# Patient Record
Sex: Female | Born: 1935 | Race: White | Hispanic: No | Smoking: Never smoker
Health system: Southern US, Community
[De-identification: ages and names within clinical notes are randomized; demographics above are authoritative.]

## PROBLEM LIST (undated history)

## (undated) DIAGNOSIS — F419 Anxiety disorder, unspecified: Secondary | ICD-10-CM

## (undated) DIAGNOSIS — I495 Sick sinus syndrome: Secondary | ICD-10-CM

## (undated) DIAGNOSIS — I499 Cardiac arrhythmia, unspecified: Secondary | ICD-10-CM

## (undated) DIAGNOSIS — G8929 Other chronic pain: Secondary | ICD-10-CM

## (undated) DIAGNOSIS — M1990 Unspecified osteoarthritis, unspecified site: Secondary | ICD-10-CM

## (undated) DIAGNOSIS — H269 Unspecified cataract: Secondary | ICD-10-CM

## (undated) DIAGNOSIS — K219 Gastro-esophageal reflux disease without esophagitis: Secondary | ICD-10-CM

## (undated) DIAGNOSIS — I700 Atherosclerosis of aorta: Secondary | ICD-10-CM

## (undated) DIAGNOSIS — Z950 Presence of cardiac pacemaker: Secondary | ICD-10-CM

## (undated) DIAGNOSIS — I10 Essential (primary) hypertension: Secondary | ICD-10-CM

## (undated) DIAGNOSIS — N049 Nephrotic syndrome with unspecified morphologic changes: Secondary | ICD-10-CM

## (undated) DIAGNOSIS — I480 Paroxysmal atrial fibrillation: Secondary | ICD-10-CM

## (undated) DIAGNOSIS — E785 Hyperlipidemia, unspecified: Secondary | ICD-10-CM

## (undated) DIAGNOSIS — R7303 Prediabetes: Secondary | ICD-10-CM

## (undated) DIAGNOSIS — E039 Hypothyroidism, unspecified: Secondary | ICD-10-CM

## (undated) DIAGNOSIS — G629 Polyneuropathy, unspecified: Secondary | ICD-10-CM

## (undated) DIAGNOSIS — M549 Dorsalgia, unspecified: Secondary | ICD-10-CM

## (undated) DIAGNOSIS — F329 Major depressive disorder, single episode, unspecified: Secondary | ICD-10-CM

## (undated) DIAGNOSIS — F32A Depression, unspecified: Secondary | ICD-10-CM

## (undated) DIAGNOSIS — J449 Chronic obstructive pulmonary disease, unspecified: Secondary | ICD-10-CM

## (undated) DIAGNOSIS — N022 Recurrent and persistent hematuria with diffuse membranous glomerulonephritis: Secondary | ICD-10-CM

## (undated) DIAGNOSIS — M8580 Other specified disorders of bone density and structure, unspecified site: Secondary | ICD-10-CM

## (undated) HISTORY — DX: Unspecified cataract: H26.9

## (undated) HISTORY — DX: Major depressive disorder, single episode, unspecified: F32.9

## (undated) HISTORY — DX: Chronic obstructive pulmonary disease, unspecified: J44.9

## (undated) HISTORY — DX: Gastro-esophageal reflux disease without esophagitis: K21.9

## (undated) HISTORY — DX: Cardiac arrhythmia, unspecified: I49.9

## (undated) HISTORY — DX: Depression, unspecified: F32.A

## (undated) HISTORY — PX: ABDOMINAL HYSTERECTOMY: SHX81

## (undated) HISTORY — PX: BACK SURGERY: SHX140

## (undated) HISTORY — DX: Atherosclerosis of aorta: I70.0

## (undated) HISTORY — DX: Prediabetes: R73.03

## (undated) HISTORY — PX: APPENDECTOMY: SHX54

## (undated) HISTORY — DX: Hyperlipidemia, unspecified: E78.5

## (undated) HISTORY — DX: Anxiety disorder, unspecified: F41.9

## (undated) HISTORY — DX: Recurrent and persistent hematuria with diffuse membranous glomerulonephritis: N02.2

## (undated) HISTORY — DX: Presence of cardiac pacemaker: Z95.0

## (undated) HISTORY — PX: OTHER SURGICAL HISTORY: SHX169

## (undated) HISTORY — DX: Dorsalgia, unspecified: M54.9

## (undated) HISTORY — DX: Nephrotic syndrome with unspecified morphologic changes: N04.9

## (undated) HISTORY — DX: Other specified disorders of bone density and structure, unspecified site: M85.80

## (undated) HISTORY — DX: Unspecified osteoarthritis, unspecified site: M19.90

## (undated) HISTORY — PX: INSERT / REPLACE / REMOVE PACEMAKER: SUR710

## (undated) HISTORY — DX: Other chronic pain: G89.29

---

## 1999-07-25 ENCOUNTER — Other Ambulatory Visit: Admission: RE | Admit: 1999-07-25 | Discharge: 1999-07-25 | Payer: Self-pay | Admitting: Obstetrics and Gynecology

## 2000-07-22 ENCOUNTER — Encounter: Payer: Self-pay | Admitting: Neurosurgery

## 2000-07-26 ENCOUNTER — Encounter: Payer: Self-pay | Admitting: Neurosurgery

## 2000-07-26 ENCOUNTER — Inpatient Hospital Stay (HOSPITAL_COMMUNITY): Admission: RE | Admit: 2000-07-26 | Discharge: 2000-07-27 | Payer: Self-pay | Admitting: Neurosurgery

## 2001-03-30 ENCOUNTER — Ambulatory Visit (HOSPITAL_COMMUNITY): Admission: RE | Admit: 2001-03-30 | Discharge: 2001-03-30 | Payer: Self-pay | Admitting: Family Medicine

## 2001-03-30 ENCOUNTER — Encounter: Payer: Self-pay | Admitting: Family Medicine

## 2001-08-04 ENCOUNTER — Encounter: Payer: Self-pay | Admitting: Orthopaedic Surgery

## 2001-08-04 ENCOUNTER — Ambulatory Visit (HOSPITAL_COMMUNITY): Admission: RE | Admit: 2001-08-04 | Discharge: 2001-08-04 | Payer: Self-pay | Admitting: Orthopaedic Surgery

## 2001-11-03 ENCOUNTER — Encounter: Payer: Self-pay | Admitting: Family Medicine

## 2001-11-03 ENCOUNTER — Inpatient Hospital Stay (HOSPITAL_COMMUNITY): Admission: AD | Admit: 2001-11-03 | Discharge: 2001-11-07 | Payer: Self-pay | Admitting: Family Medicine

## 2002-08-16 ENCOUNTER — Encounter: Payer: Self-pay | Admitting: Orthopaedic Surgery

## 2002-08-16 ENCOUNTER — Ambulatory Visit (HOSPITAL_COMMUNITY): Admission: RE | Admit: 2002-08-16 | Discharge: 2002-08-16 | Payer: Self-pay | Admitting: Orthopaedic Surgery

## 2002-12-11 ENCOUNTER — Encounter: Payer: Self-pay | Admitting: Emergency Medicine

## 2002-12-11 ENCOUNTER — Emergency Department (HOSPITAL_COMMUNITY): Admission: EM | Admit: 2002-12-11 | Discharge: 2002-12-11 | Payer: Self-pay | Admitting: Emergency Medicine

## 2004-02-23 ENCOUNTER — Emergency Department (HOSPITAL_COMMUNITY): Admission: EM | Admit: 2004-02-23 | Discharge: 2004-02-23 | Payer: Self-pay | Admitting: Emergency Medicine

## 2004-04-19 ENCOUNTER — Emergency Department (HOSPITAL_COMMUNITY): Admission: EM | Admit: 2004-04-19 | Discharge: 2004-04-20 | Payer: Self-pay | Admitting: *Deleted

## 2004-06-05 HISTORY — PX: COLONOSCOPY: SHX174

## 2004-06-11 ENCOUNTER — Ambulatory Visit (HOSPITAL_COMMUNITY): Admission: RE | Admit: 2004-06-11 | Discharge: 2004-06-11 | Payer: Self-pay | Admitting: Internal Medicine

## 2005-03-27 ENCOUNTER — Ambulatory Visit (HOSPITAL_COMMUNITY): Admission: RE | Admit: 2005-03-27 | Discharge: 2005-03-27 | Payer: Self-pay | Admitting: Neurosurgery

## 2007-01-26 ENCOUNTER — Ambulatory Visit (HOSPITAL_COMMUNITY): Admission: RE | Admit: 2007-01-26 | Discharge: 2007-01-26 | Payer: Self-pay | Admitting: Orthopaedic Surgery

## 2007-04-28 ENCOUNTER — Emergency Department (HOSPITAL_COMMUNITY): Admission: EM | Admit: 2007-04-28 | Discharge: 2007-04-28 | Payer: Self-pay | Admitting: Emergency Medicine

## 2010-06-03 ENCOUNTER — Encounter (HOSPITAL_COMMUNITY): Admission: RE | Admit: 2010-06-03 | Discharge: 2010-07-03 | Payer: Self-pay | Admitting: Hematology and Oncology

## 2010-06-03 ENCOUNTER — Ambulatory Visit (HOSPITAL_COMMUNITY): Payer: Self-pay | Admitting: Hematology and Oncology

## 2010-06-24 ENCOUNTER — Ambulatory Visit (HOSPITAL_COMMUNITY): Admission: RE | Admit: 2010-06-24 | Discharge: 2010-06-24 | Payer: Self-pay | Admitting: Family Medicine

## 2010-06-30 ENCOUNTER — Ambulatory Visit (HOSPITAL_COMMUNITY): Payer: Self-pay | Admitting: Oncology

## 2010-06-30 ENCOUNTER — Encounter (HOSPITAL_COMMUNITY): Admission: RE | Admit: 2010-06-30 | Discharge: 2010-07-04 | Payer: Self-pay | Admitting: Oncology

## 2010-07-11 ENCOUNTER — Ambulatory Visit (HOSPITAL_COMMUNITY): Admission: RE | Admit: 2010-07-11 | Discharge: 2010-07-11 | Payer: Self-pay | Admitting: Cardiology

## 2010-07-11 ENCOUNTER — Encounter (INDEPENDENT_AMBULATORY_CARE_PROVIDER_SITE_OTHER): Payer: Self-pay | Admitting: Cardiology

## 2010-07-14 ENCOUNTER — Ambulatory Visit (HOSPITAL_COMMUNITY)
Admission: RE | Admit: 2010-07-14 | Discharge: 2010-07-14 | Payer: Self-pay | Source: Home / Self Care | Admitting: Cardiology

## 2010-07-18 ENCOUNTER — Encounter (HOSPITAL_COMMUNITY): Admission: RE | Admit: 2010-07-18 | Discharge: 2010-08-17 | Payer: Self-pay | Admitting: Hematology and Oncology

## 2010-07-18 ENCOUNTER — Ambulatory Visit (HOSPITAL_COMMUNITY): Payer: Self-pay | Admitting: Hematology and Oncology

## 2010-07-22 ENCOUNTER — Encounter (HOSPITAL_COMMUNITY)
Admission: RE | Admit: 2010-07-22 | Discharge: 2010-07-22 | Payer: Self-pay | Source: Home / Self Care | Admitting: Cardiology

## 2010-10-09 ENCOUNTER — Other Ambulatory Visit: Payer: Self-pay | Admitting: Cardiovascular Disease

## 2010-10-09 ENCOUNTER — Ambulatory Visit (HOSPITAL_COMMUNITY)
Admission: RE | Admit: 2010-10-09 | Discharge: 2010-10-09 | Payer: Self-pay | Source: Home / Self Care | Attending: Cardiovascular Disease | Admitting: Cardiovascular Disease

## 2010-12-18 LAB — CBC
HCT: 32.8 % — ABNORMAL LOW (ref 36.0–46.0)
HCT: 34.3 % — ABNORMAL LOW (ref 36.0–46.0)
MCH: 31 pg (ref 26.0–34.0)
MCHC: 33.1 g/dL (ref 30.0–36.0)
MCV: 91.1 fL (ref 78.0–100.0)
MCV: 91 fL (ref 78.0–100.0)
Platelets: 347 10*3/uL (ref 150–400)
RDW: 13.7 % (ref 11.5–15.5)
RDW: 14.1 % (ref 11.5–15.5)
WBC: 5.4 10*3/uL (ref 4.0–10.5)

## 2010-12-18 LAB — IMMUNOFIXATION ELECTROPHORESIS: IgA: 227 mg/dL (ref 68–378)

## 2010-12-18 LAB — DIFFERENTIAL
Basophils Absolute: 0 10*3/uL (ref 0.0–0.1)
Basophils Absolute: 0 10*3/uL (ref 0.0–0.1)
Basophils Relative: 0 % (ref 0–1)
Eosinophils Absolute: 0.1 10*3/uL (ref 0.0–0.7)
Eosinophils Relative: 2 % (ref 0–5)
Eosinophils Relative: 1 % (ref 0–5)
Lymphocytes Relative: 37 % (ref 12–46)
Monocytes Absolute: 0.4 10*3/uL (ref 0.1–1.0)
Monocytes Absolute: 0.5 10*3/uL (ref 0.1–1.0)
Neutro Abs: 4.4 10*3/uL (ref 1.7–7.7)

## 2010-12-18 LAB — URINALYSIS, MICROSCOPIC ONLY
Nitrite: NEGATIVE
Specific Gravity, Urine: 1.02 (ref 1.005–1.030)
Urobilinogen, UA: 0.2 mg/dL (ref 0.0–1.0)

## 2010-12-18 LAB — COMPREHENSIVE METABOLIC PANEL
BUN: 16 mg/dL (ref 6–23)
Calcium: 9.2 mg/dL (ref 8.4–10.5)
Glucose, Bld: 87 mg/dL (ref 70–99)
Total Protein: 6.3 g/dL (ref 6.0–8.3)

## 2010-12-18 LAB — DIRECT ANTIGLOBULIN TEST (NOT AT ARMC)
DAT, IgG: NEGATIVE
DAT, complement: NEGATIVE

## 2010-12-18 LAB — PROTEIN, URINE, RANDOM: Total Protein, Urine: 231 mg/dL

## 2010-12-18 LAB — PROTEIN ELECTROPHORESIS, SERUM
Alpha-2-Globulin: 12.9 % — ABNORMAL HIGH (ref 7.1–11.8)
Beta Globulin: 8.7 % — ABNORMAL HIGH (ref 4.7–7.2)
M-Spike, %: NOT DETECTED g/dL
Total Protein ELP: 6.4 g/dL (ref 6.0–8.3)

## 2010-12-18 LAB — LACTATE DEHYDROGENASE: LDH: 176 U/L (ref 94–250)

## 2010-12-18 LAB — HAPTOGLOBIN: Haptoglobin: 66 mg/dL (ref 16–200)

## 2011-02-20 NOTE — H&P (Signed)
[facility]  Patient:    Kelsey Sandoval, Kelsey Sandoval Visit Number: [id number] MRN: [id number]          Service Type: MED Location: 3A [id number] Attending Physician:  Rubbie Battiest Dictated by:   Margaretmary Eddy, M.D. Admit Date:  11/03/2001                           History and Physical  CHIEF CONCERNS:  Cannot breathe, fever, cough.  SUBJECTIVE:  This patient is a 77 year old white female with history of hypertension, nephrotic syndrome and hyperlipidemia, who presented to the office the day of admission with complaints of shortness of breath.  She had had significant coughing over the past several days.  At times, her cough was productive of yellowish phlegm.  She also noted some achiness at times. Patient noted fever intermittently, particularly the last 24 hours.  She had called the office and stated she did not feel like coming in and requested medicine.  At that time, she was called in doxycycline 100 mg b.i.d.  Today, she was brought in by a friend.  Patient states no significant headache.  She notes that the cough is bad; she is bringing up phlegm.  She states she has had a pneumonia vaccine in the past and she also states that she had a flu vaccine.  Patient has been using Robitussin intermittently for her symptoms.  CURRENT MEDICATIONS: 1. Zocor 40 mg daily. 2. Estradiol 0.1 mg daily. 3. Meprobamate 400 mg one t.i.d. p.r.n. 4. Atenolol 50 mg one p.o. b.i.d. 5. Serzone 150 mg p.o. b.i.d. 6. Clonidine 0.1 mg p.o. b.i.d. 7. Amitriptyline 50 mg three p.o. q.h.s. 8. Lasix 20 mg q.a.m. 9. Vasotec 20 mg daily.  PRIOR SURGERIES/MEDICAL HISTORY:  Remote appendectomy, hysterectomy, nephrotic syndrome, oophorectomy.  FAMILY HISTORY:  Noncontributory.  SOCIAL HISTORY:  The patient is divorced.  No tobacco use.  Rare alcohol use.  ALLERGIES:  True allergies to PENICILLIN and SULFA.  REVIEW OF SYSTEMS:  Otherwise negative.  PHYSICAL EXAMINATION:  VITAL  SIGNS:  Temperature 101, respiratory rate 24, BP 150/90.  GENERAL:  Alert.  Significant malaise noted.  HEENT:  Mucous membranes slightly dry.  HEENT otherwise normal.  NECK:  Supple.  No lymphadenopathy.  No JVD.  LUNGS:  Slight tachypnea.  Right basilar crackles heard.  Minimal wheezes noted.  HEART:  Slight tachycardia.  Flow murmur.  ABDOMEN:  Soft.  No rebound or guarding.  EXTREMITIES:  No peripheral edema.  Pulses intact.  NEUROLOGIC:  No focal neurologic deficits.  LABORATORY AND ACCESSORY DATA:  Chest x-ray:  Right middle lobe pneumonia.  IMPRESSION: 1. Pneumonia. 2. Mild dehydration.  Await blood work. 3. Hypertension. 4. Nephrotic syndrome. 5. Hyperlipidemia.  PLAN:  As per orders. Dictated by:   Margaretmary Eddy, M.D. Attending Physician:  Rubbie Battiest DD:  11/03/01 TD:  11/04/01 Job: [id number]

## 2011-02-20 NOTE — Op Note (Signed)
NAME:  Kelsey Sandoval, Kelsey Sandoval                         ACCOUNT NO.:  [id number]   MEDICAL RECORD NO.:  [id number]                   PATIENT TYPE:  AMB   LOCATION:  DAY                                  FACILITY:  [facility]   PHYSICIAN:  Hildred Laser, M.D.                 DATE OF BIRTH:  Dec 18, 1932   DATE OF PROCEDURE:  06/11/2004  DATE OF DISCHARGE:                                 OPERATIVE REPORT   PROCEDURE:  Total colonoscopy.   INDICATIONS:  Shanza is a 77 year old Caucasian female who had an episode  of acute illness with nausea, vomiting, diarrhea which eventually turned  bloody. She has recovered from this illness. She has undergoing colonoscopy  to make sure she does not have residual colitis or neoplastic process. She  is also due for screening as the last colonoscopy was at least 10 years ago.  Procedure and risks were reviewed with the patient and informed consent was  obtained.   PREOPERATIVE MEDICATIONS:  Demerol 50 mg IV, Versed 9 mg IV in divided  doses.   FINDINGS:  Procedure performed in endoscopy suite. The patient's vital signs  and O2 saturations were monitored during procedure and remained stable. The  patient was placed in the left lateral position and rectal examination  performed. No abnormality noted on external or digital exam. Olympus video  scope was placed in the rectum and advanced into sigmoid colon and beyond.  Preparation was satisfactory. Scattered small diverticula noted at sigmoid  and descending colon. Scope was advanced to the cecum which was identified  by appendiceal stump and ileocecal valve. Picture taken for the record. As  the scope was withdrawn, colonic mucosa was once again carefully examined  and was normal throughout. There were no polyps and/or tumor masses. The  rectal mucosa similarly was normal. Scope was retroflexed to examine  anorectal junction. Small hemorrhoid was noted below the dentate line. The  scope was straightened and withdrawn.  The patient tolerated the procedure  well.   IMPRESSION:  Left colonic diverticulosis and small external hemorrhoids.   Suspect she had self-limiting colitis.   RECOMMENDATIONS:  1.  She will resume her usual medications.  2.  High-fiber diet.  3.  Citrucel or equivalent 1 tablespoonful daily.  4.  She may consider next exam in 10 years from now.      ___________________________________________                                            Hildred Laser, M.D.   NR/MEDQ  D:  06/11/2004  T:  06/11/2004  Job:  [id number]   cc:   Nicki Reaper A. Wolfgang Phoenix, M.D.  [address] [place]  [place] [address]  Fax: [phone]

## 2011-02-20 NOTE — Consult Note (Signed)
NAME:  Kelsey Sandoval, Kelsey Sandoval Sandoval NO.:  [id number]   MEDICAL RECORD NO.:  [id number]                   PATIENT TYPE:   LOCATION:                                       FACILITY:   PHYSICIAN:  Hildred Laser, M.D.                 DATE OF BIRTH:  [birth year]   DATE OF CONSULTATION:  DATE OF DISCHARGE:                                   CONSULTATION   REQUESTING PHYSICIAN:  Scott A. Wolfgang Phoenix, M.D.   REASON FOR CONSULTATION:  Hematochezia.   HISTORY OF PRESENT ILLNESS:  The patient is a 77 year old Caucasian female  who presents today for further evaluation of  hematochezia.  Approximately  one month ago, she developed acute onset nausea, vomiting, diarrhea for  which she presented to the emergency department via EMS.  She also has  associated abdominal pain.  She had multiple loose watery stools and  eventually developed bright red blood per rectum.  These symptoms lasted for  several days.  She has had no further hematochezia since then.  She is back  to her regular bowel habits of approximately hard stools weekly.  She is  somewhat reluctant to take any laxatives as she says they are so  unpredictable.  Her weight is down 10 pounds over the summertime.  She has  not had much of an appetite for the last four weeks up until a few days ago.  Her appetite has seemed to improve now.  She denies any significant  epigastric pain, although did have some discomfort a couple of times last  week which responded to Pepcid.  Denies any heartburn symptoms.  She is not  on any NSAIDs or aspirin products.   CURRENT MEDICATIONS:  1. Vasotec 40 mg daily.  2. Atenolol 50 mg daily.  3. Zocor 40 mg daily.  4. Estradiol 0.5 mg daily.  5. Amitriptyline 50 mg 3 at nighttime.  6. Imitrex p.r.n.  7. Clonidine 0.1 mg b.i.d.   ALLERGIES:  ZITHROMAX and PENICILLIN.   PAST MEDICAL HISTORY:  1. Migraine headaches.  2. Hypertension.  3. Hypercholesterolemia.  4. History of bronchitis.  5. History of nephrosis, although tells me she has had no further     significant protein losses in her urine.  Her care has been released from     her nephrologist.  6. Status post hysterectomy.  7. Status post appendectomy.  8. Status post single oophorectomy.  9. L4-L5 laminotomy with microdiskectomy.  10.      Colonoscopy and EGD 10 years ago for workup of anemia.  Per her     report, no etiology determined.   FAMILY HISTORY:  Mother died of staph infection.  Father died of sinus  cancer.  No family history of colorectal cancer.   SOCIAL HISTORY:  She is divorced and has two children.  She is retired.  She  has never been a smoker, denies  any alcohol use.   REVIEW OF SYSTEMS:  Please see HPI for GI.  CARDIOPULMONARY:  Denies any  current shortness of breath, cough, or chest pain.   PHYSICAL EXAMINATION:  VITAL SIGNS:  Weight 151-1/2, height 5 feet 9 inches.  Blood pressure 120/80, pulse 78.  GENERAL:  Pleasant, well-nourished, well-developed, Caucasian female in no  acute distress.  SKIN:  Warm and dry, tanned.  HEENT:  Pupils equal, round, and reactive to light.  Conjunctivae pink.  Sclerae nonicteric.  Oropharyngeal mucosa moist and pink.  No lesions,  erythema, or exudate.  NECK:  No lymphadenopathy or thyromegaly.  CHEST:  Lungs clear to auscultation.  CARDIAC:  Regular rate and rhythm.  Normal S1 and S2.  No murmurs, rubs, or  gallops.  ABDOMEN:  Positive bowel sounds.  Soft, nontender, nondistended.  No  organomegaly or masses.  EXTREMITIES:  No edema.   IMPRESSION:  Kelsey Sandoval Kelsey Sandoval Sandoval is a pleasant 77 year old lady with recent bout of  nausea, vomiting, diarrhea with hematochezia.  She may have developed a  gastroenteritis or infectious colitis.  She has had no further bleeding.  I  agree, however, she needs to have a colonoscopy for further evaluation.  She  also complains of chronic constipation but is reluctant to take laxatives.   PLAN:  1. Colonoscopy in the near  future.  2. Discussed with her today that she can try to increase her dietary fiber     or take a fiber supplement as initial treatment of her constipation.  3. I discussed risks, alternatives, and benefits with regards to bleeding,     infection, and perforation with patient, and she is agreeable to proceed.   I would like to thank Dr. Sallee Lange for allowing [place] to take part in the  care of this patient.     ________________________________________  ___________________________________________  Neil Crouch, P.A.                         Hildred Laser, M.D.   LL/MEDQ  D:  05/21/2004  T:  05/21/2004  Job:  [id number]   cc:   Nicki Reaper A. Wolfgang Phoenix, M.D.  [address] [place]  [place] [address]  Fax: [phone]

## 2011-04-24 ENCOUNTER — Emergency Department (HOSPITAL_COMMUNITY): Payer: Medicare Other

## 2011-04-24 ENCOUNTER — Emergency Department (HOSPITAL_COMMUNITY)
Admission: EM | Admit: 2011-04-24 | Discharge: 2011-04-24 | Disposition: A | Discharge status: Home / Self Care | Payer: Medicare Other | Attending: Emergency Medicine | Admitting: Emergency Medicine

## 2011-04-24 DIAGNOSIS — R079 Chest pain, unspecified: Secondary | ICD-10-CM | POA: Insufficient documentation

## 2011-04-24 DIAGNOSIS — I4891 Unspecified atrial fibrillation: Secondary | ICD-10-CM | POA: Insufficient documentation

## 2011-04-24 DIAGNOSIS — Z79899 Other long term (current) drug therapy: Secondary | ICD-10-CM | POA: Insufficient documentation

## 2011-04-24 DIAGNOSIS — R5381 Other malaise: Secondary | ICD-10-CM | POA: Insufficient documentation

## 2011-04-24 DIAGNOSIS — M549 Dorsalgia, unspecified: Secondary | ICD-10-CM | POA: Insufficient documentation

## 2011-04-24 DIAGNOSIS — G8929 Other chronic pain: Secondary | ICD-10-CM | POA: Insufficient documentation

## 2011-04-24 DIAGNOSIS — R61 Generalized hyperhidrosis: Secondary | ICD-10-CM | POA: Insufficient documentation

## 2011-04-24 DIAGNOSIS — E785 Hyperlipidemia, unspecified: Secondary | ICD-10-CM | POA: Insufficient documentation

## 2011-04-24 DIAGNOSIS — R42 Dizziness and giddiness: Secondary | ICD-10-CM | POA: Insufficient documentation

## 2011-04-24 LAB — COMPREHENSIVE METABOLIC PANEL
ALT: 15 U/L (ref 0–35)
AST: 22 U/L (ref 0–37)
Albumin: 2.9 g/dL — ABNORMAL LOW (ref 3.5–5.2)
Alkaline Phosphatase: 64 U/L (ref 39–117)
Chloride: 104 mEq/L (ref 96–112)
Potassium: 4.2 mEq/L (ref 3.5–5.1)
Sodium: 137 mEq/L (ref 135–145)
Total Bilirubin: 0.2 mg/dL — ABNORMAL LOW (ref 0.3–1.2)

## 2011-04-24 LAB — CBC
HCT: 33 % — ABNORMAL LOW (ref 36.0–46.0)
MCHC: 36.1 g/dL — ABNORMAL HIGH (ref 30.0–36.0)
MCV: 88.2 fL (ref 78.0–100.0)
RDW: 12.2 % (ref 11.5–15.5)

## 2011-04-24 LAB — TROPONIN I: Troponin I: <0.3 ng/mL (ref <0.30)

## 2011-04-24 LAB — PROTIME-INR: INR: 1.33 (ref 0.00–1.49)

## 2011-04-24 LAB — CK TOTAL AND CKMB (NOT AT ARMC): Relative Index: INVALID (ref 0.0–2.5)

## 2011-04-28 ENCOUNTER — Ambulatory Visit (HOSPITAL_COMMUNITY)
Admission: RE | Admit: 2011-04-28 | Discharge: 2011-04-28 | Disposition: A | Discharge status: Home / Self Care | Payer: Medicare Other | Source: Ambulatory Visit | Attending: Internal Medicine | Admitting: Internal Medicine

## 2011-04-28 DIAGNOSIS — I4891 Unspecified atrial fibrillation: Secondary | ICD-10-CM | POA: Insufficient documentation

## 2011-04-28 DIAGNOSIS — Z01810 Encounter for preprocedural cardiovascular examination: Secondary | ICD-10-CM | POA: Insufficient documentation

## 2011-04-28 DIAGNOSIS — Z5309 Procedure and treatment not carried out because of other contraindication: Secondary | ICD-10-CM | POA: Insufficient documentation

## 2011-04-30 NOTE — Consult Note (Signed)
Kelsey Sandoval, Kelsey Sandoval NO.:  [id number]  MEDICAL RECORD NO.:  [id number]  LOCATION:  [facility]                         FACILITY:  [facility]  PHYSICIAN:  Mali Hilty, MD         DATE OF BIRTH:  02-20-1933  DATE OF CONSULTATION:  04/24/2011 DATE OF DISCHARGE:                                CONSULTATION   CHIEF COMPLAINT:  Extreme fatigue, diaphoresis for several days, and loss of appetite similar to previous episode of AFib. ER physician asked  [place] to evaluate the patient.  HISTORY OF PRESENT ILLNESS:  This is a 77 year old white married female with history of paroxysmal AFib, underwent TEE cardioversion in January 2012, and has maintained sinus rhythm until now on her metoprolol 50 b.i.d. and Pradaxa 150 b.i.d.  This past Saturday, the patient became diaphoretic with shopping.  By Sunday, she was diaphoretic in church and then on Monday and since Monday she has felt extremely fatigued and loss of appetite similar to her previous symptoms with AFib.  She denies chest pain, nausea, or shortness of breath.  She does have dizziness if she stands too fast and feels that she may pass out if she gets up too fast.  While she is resting, she feels fine.  The patient had nuclear stress test in October 2011, which was negative for ischemia.  Previously, she had a decreased EF with AFib, but with TEE cardioversion in January 2012 her EF was 60-65%.  OUTPATIENT MEDICATIONS: 1. Amlodipine 10 mg daily. 2. Enalapril 20 mg daily. 3. Metoprolol 50 mg p.o. b.i.d. 4. Pravastatin 40 mg p.o. daily. 5. Elavil 50 mg daily. 6. Pradaxa 150 mg p.o. b.i.d. 7. Alprazolam 0.5 mg p.o. p.r.n. 8. Vitamin B12 2000 mcg daily. 9. Calcium daily. 10.Citalopram 20 mg p.o. daily.  ALLERGIES:  No known allergies.  FAMILY HISTORY:  No cardiac disease in her family, but most of her family have died from cancer.  SOCIAL HISTORY:  Married.  No tobacco.  No alcohol use.  REVIEW OF SYSTEMS:  GENERAL:  No  colds or fevers.  SKIN:  No rashes. She does have a lot of ecchymoses from bruising on her Pradaxa and did bump her leg on the dishwasher and has a laceration that is healing. HEENT:  No blurred vision or double vision.  CARDIOVASCULAR:  As stated. PULMONARY:  No shortness of breath.  GI:  No diarrhea, constipation, or melena.  GU:  No hematuria or dysuria.  ENDOCRINE:  No diabetes or thyroid disease.  MUSCULOSKELETAL:  Negative.  PHYSICAL EXAMINATION:  VITAL SIGNS:  Blood pressure 136/77, pulse 71, respiratory rate 18, temp 98.2, and oxygen saturation 98%. GENERAL:  Alert, oriented white female, tired but pleasant affect. SKIN:  Warm and dry.  Brisk capillary refill. HEENT:  Normocephalic.  Sclerae are clear. NECK:  Supple.  No obvious bruits. HEART:  S1 and S2.  Irregularly irregular.  No obvious murmurs. LUNGS:  Clear without rales, rhonchi, or wheezes. ABDOMEN:  Soft, nontender, positive bowel sounds.  Can not palpate liver or spleen masses. EXTREMITIES:  No edema.  2+ pedals bilaterally.  She has a laceration on the right shin secondary to mild trauma, healing.  No infection. NEURO:  Alert and oriented x3.  Moves all extremities.  Follows commands.  LABORATORY VALUES:  Hemoglobin 11.9, hematocrit 33, platelet 219, and WBC 8.2.  Protime 16.7, INR 1.33, and PTT 46.  Troponin I less than 0.30.  CK was 63, MB 2.0, and troponin less than 0.30.  Sodium 137, potassium 4.2, BUN 18, creatinine 0.69, and glucose 96.  LFTs were normal.  Portable chest x-ray was normal.  IMPRESSION: 1. Recurrent atrial fibrillation. 2. Moderate to severe left atrial enlargement.  PLAN: 1. Begin flecainide 50 mg p.o. q.12 h. 2. We will continue Pradaxa. 3. Plan outpatient cardioversion on Monday or Tuesday of next week     which would be April 27, 2011, or April 28, 2011.  The office will     call her with the complete detail.     Otilio Carpen. Dorene Ar, N.P.   ______________________________ Mali Hilty,  MD    LRI/MEDQ  D:  04/24/2011  T:  04/25/2011  Job:  [id number]  cc:   Sanda Klein, MD Elayne Snare. Wolfgang Phoenix, MD  Electronically Signed by Cecilie Kicks N.P. on 04/26/2011 06:49:08 PM Electronically Signed by Raliegh Ip. HILTY M.D. on 04/30/2011 06:37:58 PM

## 2011-05-01 ENCOUNTER — Emergency Department (HOSPITAL_COMMUNITY)
Admission: EM | Admit: 2011-05-01 | Discharge: 2011-05-01 | Disposition: A | Discharge status: Home / Self Care | Payer: Medicare Other | Attending: Emergency Medicine | Admitting: Emergency Medicine

## 2011-05-01 DIAGNOSIS — R0602 Shortness of breath: Secondary | ICD-10-CM | POA: Insufficient documentation

## 2011-05-01 DIAGNOSIS — E785 Hyperlipidemia, unspecified: Secondary | ICD-10-CM | POA: Insufficient documentation

## 2011-05-01 DIAGNOSIS — R61 Generalized hyperhidrosis: Secondary | ICD-10-CM | POA: Insufficient documentation

## 2011-05-01 DIAGNOSIS — R5381 Other malaise: Secondary | ICD-10-CM | POA: Insufficient documentation

## 2011-05-01 DIAGNOSIS — Z79899 Other long term (current) drug therapy: Secondary | ICD-10-CM | POA: Insufficient documentation

## 2011-05-01 DIAGNOSIS — I10 Essential (primary) hypertension: Secondary | ICD-10-CM | POA: Insufficient documentation

## 2011-05-01 DIAGNOSIS — R42 Dizziness and giddiness: Secondary | ICD-10-CM | POA: Insufficient documentation

## 2011-05-01 DIAGNOSIS — I498 Other specified cardiac arrhythmias: Secondary | ICD-10-CM | POA: Insufficient documentation

## 2011-05-01 DIAGNOSIS — R5383 Other fatigue: Secondary | ICD-10-CM | POA: Insufficient documentation

## 2011-05-01 DIAGNOSIS — I4891 Unspecified atrial fibrillation: Secondary | ICD-10-CM | POA: Insufficient documentation

## 2011-05-01 LAB — CK TOTAL AND CKMB (NOT AT ARMC)
CK, MB: 2.3 ng/mL (ref 0.3–4.0)
Total CK: 78 U/L (ref 7–177)

## 2011-05-01 LAB — DIFFERENTIAL
Basophils Absolute: 0 10*3/uL (ref 0.0–0.1)
Basophils Relative: 0 % (ref 0–1)
Eosinophils Relative: 1 % (ref 0–5)
Monocytes Absolute: 0.5 10*3/uL (ref 0.1–1.0)
Neutro Abs: 6.8 10*3/uL (ref 1.7–7.7)

## 2011-05-01 LAB — COMPREHENSIVE METABOLIC PANEL
BUN: 18 mg/dL (ref 6–23)
Calcium: 9.3 mg/dL (ref 8.4–10.5)
Creatinine, Ser: 0.97 mg/dL (ref 0.50–1.10)
GFR calc Af Amer: >60 mL/min (ref >60)
Glucose, Bld: 102 mg/dL — ABNORMAL HIGH (ref 70–99)
Total Protein: 6 g/dL (ref 6.0–8.3)

## 2011-05-01 LAB — CBC
MCHC: 35.9 g/dL (ref 30.0–36.0)
RDW: 12.3 % (ref 11.5–15.5)

## 2011-05-15 ENCOUNTER — Ambulatory Visit (HOSPITAL_COMMUNITY)
Admission: RE | Admit: 2011-05-15 | Discharge: 2011-05-17 | DRG: 244 | Disposition: A | Discharge status: Home / Self Care | Payer: Medicare Other | Source: Ambulatory Visit | Attending: Cardiovascular Disease | Admitting: Cardiovascular Disease

## 2011-05-15 DIAGNOSIS — I059 Rheumatic mitral valve disease, unspecified: Secondary | ICD-10-CM | POA: Insufficient documentation

## 2011-05-15 DIAGNOSIS — I4891 Unspecified atrial fibrillation: Secondary | ICD-10-CM | POA: Insufficient documentation

## 2011-05-15 DIAGNOSIS — T82897A Other specified complication of cardiac prosthetic devices, implants and grafts, initial encounter: Secondary | ICD-10-CM | POA: Insufficient documentation

## 2011-05-15 DIAGNOSIS — I428 Other cardiomyopathies: Secondary | ICD-10-CM | POA: Insufficient documentation

## 2011-05-15 DIAGNOSIS — I495 Sick sinus syndrome: Secondary | ICD-10-CM | POA: Insufficient documentation

## 2011-05-15 DIAGNOSIS — Y831 Surgical operation with implant of artificial internal device as the cause of abnormal reaction of the patient, or of later complication, without mention of misadventure at the time of the procedure: Secondary | ICD-10-CM | POA: Insufficient documentation

## 2011-05-15 LAB — SURGICAL PCR SCREEN: MRSA, PCR: NEGATIVE

## 2011-05-16 ENCOUNTER — Ambulatory Visit (HOSPITAL_COMMUNITY): Payer: Medicare Other

## 2011-05-16 LAB — GLUCOSE, CAPILLARY: Glucose-Capillary: 99 mg/dL (ref 70–99)

## 2011-05-16 NOTE — Op Note (Signed)
NAMESHRONDA, Kelsey Sandoval NO.:  [id number]  MEDICAL RECORD NO.:  [id number]  LOCATION:  [id number]                         FACILITY:  [facility]  PHYSICIAN:  Sanda Klein, MD     DATE OF BIRTH:  December 26, 1932  DATE OF PROCEDURE:  05/15/2011 DATE OF DISCHARGE:                              OPERATIVE REPORT   PROCEDURE PERFORMED: 1. Implantation of new dual-chamber permanent pacemaker. 2. Moderate sedation. 3. Fluoroscopy.  REASON FOR THE PROCEDURES: 1. Sinus node dysfunction with sinus bradycardia and sinus node     arrest, manifesting primarily as fatigue. 2. Tachycardia/bradycardia syndrome. 3. Paroxysmal atrial fibrillation with rapid ventricular response. 4. Bradycardia due to necessary medications.  MEDICATIONS ADMINISTERED DURING PROCEDURE:  Ancef 1 gram intravenously, lidocaine 1% 35 mL locally, Versed 3 mg intravenously, fentanyl 100 mcg intravenously.  ESTIMATED BLOOD LOSS:  Less than 10 mL.  COMPLICATIONS:  None.  DEVICE DETAILS:  The device implanted is a Medtronic Adapta, model number ADDRL1, serial number [id number].  The atrial lead is a Medtronic 5076 - 45 cm, serial number [id number].  Right ventricular lead is a Medtronic 5076 - 52 cm, serial number [id number].  At the end of the procedure, the following electronic parameters were encountered.  Right atrial lead sensed P-wave 2.1 mV, impedance 618 ohms, threshold 1 volt at 0.5 milliseconds pulse width, no diaphragmatic stimulation at maximum device output.  Right ventricular lead sensed R-waves 13.5 mV, impedance 925 ohms, threshold 1.1 volts at 0.5 milliseconds pulse width, no diaphragmatic stimulation at maximum device output.  After risks and benefits of the procedure described, the patient provided informed consent, was brought to the cardiac cath lab in a fasting state.  She was prepped and draped in usual sterile fashion. Local anesthesia 1% lidocaine was administered to the right  prepectoral area, 6-cm horizontal incision was made parallel to the inferior border of the right clavicle roughly 2-3 cm caudal to it.  Using electrocautery and blunt dissection, a pocket was created down to the level of the pectoralis muscle fascia.  The pocket was carefully inspected for hemostasis which was found to be good.  An antibiotic-soaked sponge was placed in the pocket for the duration of venous access.  Under fluoroscopic guidance and using the modified Seldinger technique, 2 separate venipunctures were performed in the right subclavian vein.  J- tipped guidewires were subsequently exchanged for 7-French SafeSheath.  Under fluoroscopic guidance, right ventricular lead was advanced to level of the right ventricular apical septum and the active fixation helix was deployed.  There was moderate current of injury.  Satisfactory sensing and pacing parameters were noted and there was no evidence of diaphragmatic/phrenic nerve stimulation maximum device output.  The SafeSheath was peeled away and lead was secured in place using 2-0 silk.  In similar fashion, the right atrial lead was advanced to level of the atrial appendage and the active fixation helix was deployed.  There was good sensing and pacing, although the current of injury was only minimal.  There was no evidence of phrenic nerve/diaphragmatic stimulation at maximum device output.  The SafeSheath was peeled away and the lead was secured in place using 2- 0 silk.  The antibiotic-soaked sponge  was removed from pocket, and the pocket was flushed with copious amounts of antibiotic solution.  The generator was then attached the ventricular lead with appropriate ventricular pacing and then to the atrial lead with appropriate atrial sensing noted.  The generator was placed in the pocket with great care being taken that the leads be located deep to the generator and assume a comfortable position.  Hemostasis was found  to still be excellent.  The pocket was closed in layers using two layers of 2-0 silk and cutaneous staples after which a sterile pressure dressing was applied.     Sanda Klein, MD     MC/MEDQ  D:  05/15/2011  T:  05/15/2011  Job:  [id number]  cc:   Estill Bamberg. Karie Kirks, M.D. [facility]  Electronically Signed by Sanda Klein M.D. on 05/16/2011 11:29:55 AM

## 2011-05-17 ENCOUNTER — Inpatient Hospital Stay (HOSPITAL_COMMUNITY): Payer: Medicare Other

## 2011-05-25 NOTE — Discharge Summary (Signed)
Kelsey Sandoval, GUSTER NO.:  [id number]  MEDICAL RECORD NO.:  [id number]  LOCATION:  66                         FACILITY:  [facility]  PHYSICIAN:  Sanda Klein, MD     DATE OF BIRTH:  19-Oct-1932  DATE OF ADMISSION:  05/15/2011 DATE OF DISCHARGE:  05/17/2011                              DISCHARGE SUMMARY   DISCHARGE DIAGNOSES: 1. Status post permanent pacemaker, lead revision August 10, August 11     respectively. 2. Tachybrady syndrome. 3. Persistent atrial fibrillation. 4. Mitral insufficiency. 5. Hypertension.  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  Has history of tachybrady syndrome as well as hypertension and.  She presented for outpatient placement of permanent pacemaker.  This was completed on May 15, 2011.  Testing of the device after the procedure on August 11 indicated that lead revision was required in the atrial lead.  She was completed on August 11 without complications.  The patient was transferred to telemetry.  Current chest x-ray shows lead revision is intact.  The patient's blood pressure had been elevated as well.  She was started on clonidine 0.1 mg t.i.d. and has better control.  She had been seen by Dr. Sallyanne Kuster.  I feel she is stable for discharge.  STUDIES/PROCEDURES: 1. Permanent pacemaker installation.  Device is Medtronic Adapta model     AVDRL1, serial number [id number].  This completed on May 17, 2011. 2. May 16, 2011, atrial lead revision secondary to dislodgement and     negative complications. 3. Chest x-ray shows proper placement of atrial lead.  There is no     pneumothorax or apparent complication.  DISCHARGE  MEDICATIONS: 1. Acetaminophen 325 mg 1-2 tablets by mouth every 4 hours as needed. 2. Clonidine 0.1 mg 1 tablet by mouth twice daily. 3. Vicodin 5/500, 1-2 tablets by mouth every 6 hours as needed. 4. Alprazolam 0.5 mg 1 tablet by mouth daily at bedtime. 5. Amitriptyline 50 mg 1 tablet by mouth daily at bedtime. 6. Amlodipine 10 mg 1 tablet by mouth daily. 7. Calcium citrate over-the-counter 1 tablet by mouth daily. 8. Citalopram 20 mg 1 tablet by mouth daily. 9. Enalapril 20 mg 1 tablet by mouth daily. 10.Flecainide 50 mg 1 tab by mouth twice daily. 11.Metoprolol tartrate 50 mg 1/2 tablet by mouth twice daily. 12.Multivitamin over-the-counter 1 tablet by mouth daily. 13.Stool softner over-the-counter 1 tablet by mouth daily at bedtime. 14.Pradaxa 150 mg 1 tablet by mouth twice daily. 15.Pravastatin 20 mg 1 tablet by mouth daily. 16.Vitamin B12 over-the-counter 1 tablet by mouth daily.  DISPOSITION:  Kelsey Sandoval will be discharged home in stable condition. Recommend she increase her activity slowly.  She may shower and bathe and she keep the incision site dry for 1 week.  No lifting for 1 week. No driving for 1 week.  Only short trips thereafter.  Remove the dressing after 48 hours and she is not to apply Neosporin or Betadine on the incision and no tub  or pools for 2 weeks.  Also to continue the sling for 2 weeks period.  If incision site becomes red, painful, swollen, discharges fluid or pus, she is to call our office immediately. She will follow with Dr. Sallyanne Kuster in approximately 1 week for wound site check and our office will call with the appointment time.    ______________________________ Tarri Fuller, PA   ______________________________ Sanda Klein, MD    BH/MEDQ  D:  05/17/2011  T:  05/17/2011  Job:  [id number]  cc:   Sanda Klein, MD Scott A. Wolfgang Phoenix, MD  Electronically Signed by Tarri Fuller PA on 05/22/2011 03:29:32 PM Electronically Signed by Sanda Klein M.D.  on 05/25/2011 06:32:15 PM

## 2011-05-25 NOTE — Op Note (Signed)
NAMEFAIREN, DELISIO NO.:  [id number]  MEDICAL RECORD NO.:  [id number]  LOCATION:  13                         FACILITY:  [facility]  PHYSICIAN:  Sanda Klein, MD     DATE OF BIRTH:  1933-02-09  DATE OF PROCEDURE:  05/16/2011 DATE OF DISCHARGE:  05/17/2011                              OPERATIVE REPORT   PROCEDURE PERFORMED: 1. Atrial lead revision. 2. AIGIS pouch implantation.  REASON FOR THE PROCEDURE:  Atrial lead revision secondary to dislodged atrial lead.  PROCEDURE PERFORMED:  Sanda Klein, MD  MEDICATIONS ADMINISTERED:  Ancef 1 g intravenously, lidocaine 1% 30 mL locally, Versed and fentanyl for moderate sedation.  Ms. Fazzone is a 77 year old woman with a history of paroxysmal atrial fibrillation and sinus node dysfunction with tachycardia-bradycardia syndrome.  The treatment with flecainide and beta-blockers have successfully prevented atrial fibrillation but has caused symptomatic sinus bradycardia.  On May 15, 2011, she underwent implantation of a dual-chamber permanent pacemaker (Medtronic Adapta DR), however, the following morning device check as well as radiographic findings showed evidence of atrial lead dislodgement.  She is therefore brought back for atrial lead revision.  After risks and benefits of this procedure were discussed, the patient provided informed consent.  She was brought to the cardiac cath lab in a fasting state.  The previously placed cutaneous staples were removed and the site was prepped and draped in the usual sterile fashion.  Local anesthesia with 1% lidocaine was administered.  The pocket was reopened by sectioning the Vicryl sutures that were in place.  The generator was explanted and the atrial lead was disconnected.  A J-tip stylet was inserted and the active fixation helix was retracted.  The lead was repositioned in the level of the right atrial appendage.  After appropriate electronic mapping  identified an adequate spot, the active fixation helix was redeployed.  There was prominent current of injury. All electronic parameters were within normal range.  There was no evidence of diaphragmatic/phrenic nerve stimulation at maximum device output.  The lead was secured in place using 2-0 silk after which the stylet was removed.  Care was taken to provide adequate slack.  The slack in the ventricular lead was also adjusted and the ventricular lead was retested.  The pocket was then flushed with copious amounts of antibiotic solution and inspected for hemostasis which was found to be excellent.  Due to the fact that this was a re-intervention and because the patient was felt to be at increased risk of device infection an AIGIS pouch was used to wrap the pacemaker.  A loose suture was made at the mouth of the pouch after which the pouch and device were placed in the pocket.  Great care was taken to make sure there was enough room for the patch to be comfortably inserted without pressure and also that the leads be located deep to the generator.  The pouch was secured to the pectoralis muscle fascia with a single silk suture.  The pocket was then closed in layers using two layers of 2-0 Vicryl and cutaneous staples.  A sterile dressing was applied.  At the end of the procedure, the following electronic parameters were  encountered.  Atrial lead impedance 511 ohms, atrial sensed P-waves 3.7 mV, atrial threshold 1.3 volts at 0.5 milliseconds pulse width, current 2.9 mA.  The ventricular lead impedance was 628 ohms, sensed R-waves 15.7 mV and threshold 0.7 volts at 0.5 milliseconds pulse width, current 1.3 mA.  No immediate complications occurred.     Sanda Klein, MD     MC/MEDQ  D:  05/18/2011  T:  05/18/2011  Job:  [id number]  cc:   [facility] Scott A. Wolfgang Phoenix, MD  Electronically Signed by Sanda Klein M.D. on 05/25/2011 06:32:20 PM

## 2011-10-07 DIAGNOSIS — R5383 Other fatigue: Secondary | ICD-10-CM | POA: Diagnosis not present

## 2011-10-07 DIAGNOSIS — M545 Low back pain: Secondary | ICD-10-CM | POA: Diagnosis not present

## 2011-10-07 DIAGNOSIS — R5381 Other malaise: Secondary | ICD-10-CM | POA: Diagnosis not present

## 2011-10-07 DIAGNOSIS — E785 Hyperlipidemia, unspecified: Secondary | ICD-10-CM | POA: Diagnosis not present

## 2011-10-09 DIAGNOSIS — G609 Hereditary and idiopathic neuropathy, unspecified: Secondary | ICD-10-CM | POA: Diagnosis not present

## 2011-10-09 DIAGNOSIS — E782 Mixed hyperlipidemia: Secondary | ICD-10-CM | POA: Diagnosis not present

## 2011-10-09 DIAGNOSIS — Z79899 Other long term (current) drug therapy: Secondary | ICD-10-CM | POA: Diagnosis not present

## 2011-10-09 DIAGNOSIS — R5381 Other malaise: Secondary | ICD-10-CM | POA: Diagnosis not present

## 2011-10-20 DIAGNOSIS — I4891 Unspecified atrial fibrillation: Secondary | ICD-10-CM | POA: Diagnosis not present

## 2011-10-20 DIAGNOSIS — I059 Rheumatic mitral valve disease, unspecified: Secondary | ICD-10-CM | POA: Diagnosis not present

## 2011-10-26 DIAGNOSIS — L2089 Other atopic dermatitis: Secondary | ICD-10-CM | POA: Diagnosis not present

## 2011-11-25 DIAGNOSIS — H01009 Unspecified blepharitis unspecified eye, unspecified eyelid: Secondary | ICD-10-CM | POA: Diagnosis not present

## 2011-11-25 DIAGNOSIS — J019 Acute sinusitis, unspecified: Secondary | ICD-10-CM | POA: Diagnosis not present

## 2012-01-07 DIAGNOSIS — M25569 Pain in unspecified knee: Secondary | ICD-10-CM | POA: Diagnosis not present

## 2012-02-18 DIAGNOSIS — M25569 Pain in unspecified knee: Secondary | ICD-10-CM | POA: Diagnosis not present

## 2012-03-07 DIAGNOSIS — I4891 Unspecified atrial fibrillation: Secondary | ICD-10-CM | POA: Diagnosis not present

## 2012-03-07 DIAGNOSIS — I10 Essential (primary) hypertension: Secondary | ICD-10-CM | POA: Diagnosis not present

## 2012-03-28 DIAGNOSIS — I10 Essential (primary) hypertension: Secondary | ICD-10-CM | POA: Diagnosis not present

## 2012-03-28 DIAGNOSIS — R5381 Other malaise: Secondary | ICD-10-CM | POA: Diagnosis not present

## 2012-03-28 DIAGNOSIS — E785 Hyperlipidemia, unspecified: Secondary | ICD-10-CM | POA: Diagnosis not present

## 2012-03-28 DIAGNOSIS — J189 Pneumonia, unspecified organism: Secondary | ICD-10-CM | POA: Diagnosis not present

## 2012-03-30 DIAGNOSIS — Z79899 Other long term (current) drug therapy: Secondary | ICD-10-CM | POA: Diagnosis not present

## 2012-03-30 DIAGNOSIS — D649 Anemia, unspecified: Secondary | ICD-10-CM | POA: Diagnosis not present

## 2012-03-30 DIAGNOSIS — M949 Disorder of cartilage, unspecified: Secondary | ICD-10-CM | POA: Diagnosis not present

## 2012-03-30 DIAGNOSIS — M899 Disorder of bone, unspecified: Secondary | ICD-10-CM | POA: Diagnosis not present

## 2012-04-11 DIAGNOSIS — D649 Anemia, unspecified: Secondary | ICD-10-CM | POA: Diagnosis not present

## 2012-05-04 DIAGNOSIS — IMO0002 Reserved for concepts with insufficient information to code with codable children: Secondary | ICD-10-CM | POA: Diagnosis not present

## 2012-05-04 DIAGNOSIS — M5126 Other intervertebral disc displacement, lumbar region: Secondary | ICD-10-CM | POA: Diagnosis not present

## 2012-05-17 DIAGNOSIS — M25579 Pain in unspecified ankle and joints of unspecified foot: Secondary | ICD-10-CM | POA: Diagnosis not present

## 2012-05-17 DIAGNOSIS — M25569 Pain in unspecified knee: Secondary | ICD-10-CM | POA: Diagnosis not present

## 2012-06-28 DIAGNOSIS — M25569 Pain in unspecified knee: Secondary | ICD-10-CM | POA: Diagnosis not present

## 2012-07-07 DIAGNOSIS — I495 Sick sinus syndrome: Secondary | ICD-10-CM | POA: Diagnosis not present

## 2012-07-07 DIAGNOSIS — I4891 Unspecified atrial fibrillation: Secondary | ICD-10-CM | POA: Diagnosis not present

## 2012-07-18 DIAGNOSIS — J988 Other specified respiratory disorders: Secondary | ICD-10-CM | POA: Diagnosis not present

## 2012-07-18 DIAGNOSIS — J209 Acute bronchitis, unspecified: Secondary | ICD-10-CM | POA: Diagnosis not present

## 2012-08-01 DIAGNOSIS — Z23 Encounter for immunization: Secondary | ICD-10-CM | POA: Diagnosis not present

## 2012-08-01 DIAGNOSIS — I10 Essential (primary) hypertension: Secondary | ICD-10-CM | POA: Diagnosis not present

## 2012-08-01 DIAGNOSIS — E785 Hyperlipidemia, unspecified: Secondary | ICD-10-CM | POA: Diagnosis not present

## 2012-08-01 DIAGNOSIS — M129 Arthropathy, unspecified: Secondary | ICD-10-CM | POA: Diagnosis not present

## 2012-08-01 DIAGNOSIS — D649 Anemia, unspecified: Secondary | ICD-10-CM | POA: Diagnosis not present

## 2012-08-09 DIAGNOSIS — Z79899 Other long term (current) drug therapy: Secondary | ICD-10-CM | POA: Diagnosis not present

## 2012-08-09 DIAGNOSIS — M949 Disorder of cartilage, unspecified: Secondary | ICD-10-CM | POA: Diagnosis not present

## 2012-08-09 DIAGNOSIS — D649 Anemia, unspecified: Secondary | ICD-10-CM | POA: Diagnosis not present

## 2012-08-09 DIAGNOSIS — E785 Hyperlipidemia, unspecified: Secondary | ICD-10-CM | POA: Diagnosis not present

## 2012-08-09 DIAGNOSIS — M25569 Pain in unspecified knee: Secondary | ICD-10-CM | POA: Diagnosis not present

## 2012-09-07 DIAGNOSIS — I10 Essential (primary) hypertension: Secondary | ICD-10-CM | POA: Diagnosis not present

## 2012-09-07 DIAGNOSIS — I495 Sick sinus syndrome: Secondary | ICD-10-CM | POA: Diagnosis not present

## 2012-09-07 DIAGNOSIS — I4891 Unspecified atrial fibrillation: Secondary | ICD-10-CM | POA: Diagnosis not present

## 2012-09-19 DIAGNOSIS — I69998 Other sequelae following unspecified cerebrovascular disease: Secondary | ICD-10-CM | POA: Diagnosis not present

## 2012-09-19 DIAGNOSIS — J209 Acute bronchitis, unspecified: Secondary | ICD-10-CM | POA: Diagnosis not present

## 2012-09-26 ENCOUNTER — Encounter: Payer: Self-pay | Admitting: Cardiovascular Disease

## 2012-09-26 DIAGNOSIS — I480 Paroxysmal atrial fibrillation: Secondary | ICD-10-CM

## 2012-09-26 DIAGNOSIS — I495 Sick sinus syndrome: Secondary | ICD-10-CM | POA: Insufficient documentation

## 2012-09-26 DIAGNOSIS — I10 Essential (primary) hypertension: Secondary | ICD-10-CM | POA: Insufficient documentation

## 2012-09-26 DIAGNOSIS — E785 Hyperlipidemia, unspecified: Secondary | ICD-10-CM | POA: Insufficient documentation

## 2012-10-19 DIAGNOSIS — J019 Acute sinusitis, unspecified: Secondary | ICD-10-CM | POA: Diagnosis not present

## 2012-10-19 DIAGNOSIS — J42 Unspecified chronic bronchitis: Secondary | ICD-10-CM | POA: Diagnosis not present

## 2012-10-21 DIAGNOSIS — J209 Acute bronchitis, unspecified: Secondary | ICD-10-CM | POA: Diagnosis not present

## 2012-12-23 ENCOUNTER — Other Ambulatory Visit (HOSPITAL_COMMUNITY): Payer: Self-pay | Admitting: Family Medicine

## 2012-12-24 ENCOUNTER — Other Ambulatory Visit: Payer: Self-pay | Admitting: *Deleted

## 2012-12-24 ENCOUNTER — Encounter: Payer: Self-pay | Admitting: *Deleted

## 2012-12-24 MED ORDER — PRAVASTATIN SODIUM 80 MG PO TABS: 80.0000 mg | ORAL_TABLET | Freq: Every day | ORAL | Status: DC

## 2013-01-09 ENCOUNTER — Other Ambulatory Visit (HOSPITAL_COMMUNITY): Payer: Self-pay | Admitting: Family Medicine

## 2013-01-19 ENCOUNTER — Other Ambulatory Visit (HOSPITAL_COMMUNITY): Payer: Self-pay | Admitting: Family Medicine

## 2013-01-23 ENCOUNTER — Encounter: Payer: Self-pay | Admitting: *Deleted

## 2013-01-23 ENCOUNTER — Other Ambulatory Visit (HOSPITAL_COMMUNITY): Payer: Self-pay | Admitting: Family Medicine

## 2013-02-06 ENCOUNTER — Other Ambulatory Visit: Payer: Self-pay | Admitting: Family Medicine

## 2013-02-06 ENCOUNTER — Other Ambulatory Visit (HOSPITAL_COMMUNITY): Payer: Self-pay | Admitting: Family Medicine

## 2013-02-07 ENCOUNTER — Telehealth: Payer: Self-pay | Admitting: Family Medicine

## 2013-02-07 MED ORDER — AMLODIPINE BESYLATE 10 MG PO TABS: 10.0000 mg | ORAL_TABLET | Freq: Every day | ORAL | Status: DC

## 2013-02-07 NOTE — Telephone Encounter (Signed)
Meds refilled . Patient notified.

## 2013-02-07 NOTE — Telephone Encounter (Signed)
Refill times 1 each. Needs ov

## 2013-02-07 NOTE — Telephone Encounter (Signed)
Pt needs refills, on Alprazolam, Amlodipine, Hydrocodone, elavil

## 2013-02-10 ENCOUNTER — Ambulatory Visit (INDEPENDENT_AMBULATORY_CARE_PROVIDER_SITE_OTHER): Payer: Medicare Other | Admitting: Family Medicine

## 2013-02-10 ENCOUNTER — Encounter: Payer: Self-pay | Admitting: Family Medicine

## 2013-02-10 VITALS — BP 146/80 | HR 80 | Wt 175.4 lb

## 2013-02-10 DIAGNOSIS — E785 Hyperlipidemia, unspecified: Secondary | ICD-10-CM

## 2013-02-10 DIAGNOSIS — R5381 Other malaise: Secondary | ICD-10-CM

## 2013-02-10 DIAGNOSIS — I10 Essential (primary) hypertension: Secondary | ICD-10-CM

## 2013-02-10 DIAGNOSIS — R5383 Other fatigue: Secondary | ICD-10-CM

## 2013-02-10 MED ORDER — ALPRAZOLAM 0.5 MG PO TABS: ORAL_TABLET | ORAL | Status: DC

## 2013-02-10 MED ORDER — AMLODIPINE BESYLATE 10 MG PO TABS: 10.0000 mg | ORAL_TABLET | Freq: Every day | ORAL | Status: DC

## 2013-02-10 MED ORDER — HYDROCODONE-ACETAMINOPHEN 5-325 MG PO TABS: 1.0000 | ORAL_TABLET | Freq: Three times a day (TID) | ORAL | Status: DC | PRN

## 2013-02-10 MED ORDER — PRAVASTATIN SODIUM 80 MG PO TABS: 80.0000 mg | ORAL_TABLET | Freq: Every day | ORAL | Status: DC

## 2013-02-10 NOTE — Progress Notes (Signed)
  Subjective:    Patient ID: Kelsey Sandoval, female    DOB: [birth year], 79 y.o.   MRN: [id number]  HPI Patient relates intermittent nausea comes and goes no severe abdominal pain no vomiting diarrhea or bloody stools with it. She does state that she's lost a few pounds since last being seen. She states she tries to eat some but states she could eat more than what she does She does relate a lot of anxiety and at times feels depressed. She states she's unable to do the hobbies she used to do plus she is limited of how often she can interact with family because they don't come around as much she denies being suicidal or homicidal She does have a lot of orthopedic pain in her low back and in her knees and wrists pain medication helps her she denies abusing it denies side effects She relates she sees her cardiologist on a regular basis denies any current cardiac symptoms See problem list see past medical history. This was reviewed.   Review of SystemsNegative for chest pain shortness of breath rectal bleeding hematuria headaches blurred vision vomiting     Objective:   Physical Exam Vital signs stable, neck no masses. Face no swelling no growths noted, lungs are clear no crackles heart is regular no murmurs abdomen is soft extremities no edema skin warm dry neurologic grossly normal       Assessment & Plan:  #1 chronic anxiety-Xanax when necessary cautioned drowsiness stable #2 arthralgias chronic low back pain-hydrocodone maximum 3 times per day cautioned drowsiness not for over use. #3 hypertension decent control continue current measures followup with cardiologist regular basis #4 occasional depression issues decent control with current medication we talked about acquiring you hobbies to help her out. #5 patient will do lab work will followup in approximately 4 months. #6-patient has lost 5 pounds I told her when she follows up again we will want to recheck her weight. If her weight continues  to go down she will have to go through further testing. I suspect most of this related to depression but if weight goes further down will need to do testing to rule out other diseases.

## 2013-02-20 ENCOUNTER — Encounter: Payer: Self-pay | Admitting: Family Medicine

## 2013-02-20 DIAGNOSIS — I10 Essential (primary) hypertension: Secondary | ICD-10-CM | POA: Diagnosis not present

## 2013-02-20 DIAGNOSIS — R5381 Other malaise: Secondary | ICD-10-CM | POA: Diagnosis not present

## 2013-02-20 LAB — BASIC METABOLIC PANEL
Calcium: 9 mg/dL (ref 8.4–10.5)
Glucose, Bld: 88 mg/dL (ref 70–99)
Potassium: 4.2 mEq/L (ref 3.5–5.3)
Sodium: 140 mEq/L (ref 135–145)

## 2013-02-20 LAB — TSH: TSH: 2.6 u[IU]/mL (ref 0.350–4.500)

## 2013-03-09 ENCOUNTER — Other Ambulatory Visit (HOSPITAL_COMMUNITY): Payer: Self-pay | Admitting: Family Medicine

## 2013-03-23 ENCOUNTER — Other Ambulatory Visit: Payer: Self-pay | Admitting: Cardiovascular Disease

## 2013-03-23 DIAGNOSIS — I495 Sick sinus syndrome: Secondary | ICD-10-CM

## 2013-03-23 LAB — PACEMAKER DEVICE OBSERVATION

## 2013-03-27 ENCOUNTER — Encounter: Payer: Self-pay | Admitting: *Deleted

## 2013-03-27 LAB — REMOTE PACEMAKER DEVICE
AL AMPLITUDE: 2.8 mv
AL THRESHOLD: 1.125 V
BAMS-0001: 150 {beats}/min
RV LEAD AMPLITUDE: 11.2 mv

## 2013-04-21 ENCOUNTER — Encounter: Payer: Self-pay | Admitting: Cardiovascular Disease

## 2013-04-21 DIAGNOSIS — M25569 Pain in unspecified knee: Secondary | ICD-10-CM | POA: Diagnosis not present

## 2013-04-21 DIAGNOSIS — I10 Essential (primary) hypertension: Secondary | ICD-10-CM | POA: Diagnosis not present

## 2013-04-26 ENCOUNTER — Other Ambulatory Visit: Payer: Self-pay | Admitting: Family Medicine

## 2013-05-05 ENCOUNTER — Encounter: Payer: Self-pay | Admitting: Family Medicine

## 2013-05-05 ENCOUNTER — Ambulatory Visit (INDEPENDENT_AMBULATORY_CARE_PROVIDER_SITE_OTHER): Payer: Medicare Other | Admitting: Family Medicine

## 2013-05-05 VITALS — BP 152/86 | Temp 98.3°F | Wt 172.8 lb

## 2013-05-05 DIAGNOSIS — J209 Acute bronchitis, unspecified: Secondary | ICD-10-CM | POA: Diagnosis not present

## 2013-05-05 MED ORDER — CEFPROZIL 500 MG PO TABS: 500.0000 mg | ORAL_TABLET | Freq: Two times a day (BID) | ORAL | Status: DC

## 2013-05-05 NOTE — Progress Notes (Signed)
  Subjective:    Patient ID: Kelsey Sandoval, female    DOB: 1933-03-31, 79 y.o.   MRN: [id number]  Cough This is a new problem. The current episode started in the past 7 days. The problem has been gradually worsening. Associated symptoms include chest pain, headaches and wheezing.   Patient denies any shortness of breath has had some wheezing intermittent has albuterol to treat this.   Review of Systems  Respiratory: Positive for cough and wheezing.   Cardiovascular: Positive for chest pain.  Neurological: Positive for headaches.       Objective:   Physical Exam Eardrums normal throat normal sinus nontender lungs bilateral cough noted not respiratory distress no wheezing or difficulty breathing       Assessment & Plan:  Acute bronchitis-Cefzil twice daily for 10 days was ordered. Patient has taken this family of medications before. Tussionex teaspoon twice a day prescription was written, cautioned drowsiness not for frequent use for cough only not with hydrocodone

## 2013-05-16 ENCOUNTER — Ambulatory Visit (INDEPENDENT_AMBULATORY_CARE_PROVIDER_SITE_OTHER): Payer: Medicare Other | Admitting: Family Medicine

## 2013-05-16 ENCOUNTER — Encounter: Payer: Self-pay | Admitting: Family Medicine

## 2013-05-16 VITALS — BP 122/78 | Temp 98.4°F | Ht 67.5 in | Wt 169.6 lb

## 2013-05-16 DIAGNOSIS — J209 Acute bronchitis, unspecified: Secondary | ICD-10-CM | POA: Diagnosis not present

## 2013-05-16 MED ORDER — CIPROFLOXACIN HCL 500 MG PO TABS: 500.0000 mg | ORAL_TABLET | Freq: Two times a day (BID) | ORAL | Status: AC

## 2013-05-16 MED ORDER — ONDANSETRON HCL 8 MG PO TABS: 8.0000 mg | ORAL_TABLET | Freq: Three times a day (TID) | ORAL | Status: DC | PRN

## 2013-05-16 MED ORDER — PREDNISONE 20 MG PO TABS: ORAL_TABLET | ORAL | Status: AC

## 2013-05-16 NOTE — Patient Instructions (Signed)
If worse call

## 2013-05-16 NOTE — Progress Notes (Signed)
  Subjective:    Patient ID: Kelsey Sandoval, female    DOB: [birth year], 79 y.o.   MRN: [id number]  Cough This is a new problem. The current episode started 1 to 4 weeks ago. Associated symptoms include chest pain (with cough on l side) and shortness of breath (with activity). Pertinent negatives include no fever, postnasal drip or rhinorrhea.  Has 2 days left of Cefzil- Has lost appetite and had vomiting since starting antibiotic. Also sore on left side when coughs. Thinks medicine making her sick No night sweats   Review of Systems  Constitutional: Positive for appetite change (poor) and fatigue. Negative for fever and activity change.  HENT: Negative for congestion, rhinorrhea and postnasal drip.   Respiratory: Positive for cough and shortness of breath (with activity). Negative for chest tightness.   Cardiovascular: Positive for chest pain (with cough on l side).  Gastrointestinal: Positive for vomiting (about once a day).       Objective:   Physical Exam  Constitutional: She appears well-developed and well-nourished.  Cardiovascular: Normal rate, regular rhythm and normal heart sounds.   No murmur heard. Pulmonary/Chest: Effort normal. No respiratory distress. She has no wheezes.  Lymphadenopathy:    She has no cervical adenopathy.          Assessment & Plan:  Persistent bronchitis-prednisone taper switching medications the doxycycline if not responding over the next couple months next step x-rays no need for lab work sore CT scan currently warning signs were discussed

## 2013-05-29 ENCOUNTER — Other Ambulatory Visit: Payer: Self-pay | Admitting: Family Medicine

## 2013-06-06 ENCOUNTER — Ambulatory Visit (INDEPENDENT_AMBULATORY_CARE_PROVIDER_SITE_OTHER): Payer: Medicare Other | Admitting: Cardiovascular Disease

## 2013-06-06 ENCOUNTER — Encounter: Payer: Self-pay | Admitting: Cardiovascular Disease

## 2013-06-06 VITALS — BP 116/78 | HR 75 | Resp 16 | Ht 67.0 in | Wt 171.3 lb

## 2013-06-06 DIAGNOSIS — E785 Hyperlipidemia, unspecified: Secondary | ICD-10-CM

## 2013-06-06 DIAGNOSIS — I4891 Unspecified atrial fibrillation: Secondary | ICD-10-CM

## 2013-06-06 DIAGNOSIS — Z950 Presence of cardiac pacemaker: Secondary | ICD-10-CM

## 2013-06-06 DIAGNOSIS — I480 Paroxysmal atrial fibrillation: Secondary | ICD-10-CM

## 2013-06-06 DIAGNOSIS — I10 Essential (primary) hypertension: Secondary | ICD-10-CM

## 2013-06-06 DIAGNOSIS — I495 Sick sinus syndrome: Secondary | ICD-10-CM

## 2013-06-06 DIAGNOSIS — I059 Rheumatic mitral valve disease, unspecified: Secondary | ICD-10-CM | POA: Diagnosis not present

## 2013-06-06 DIAGNOSIS — I340 Nonrheumatic mitral (valve) insufficiency: Secondary | ICD-10-CM

## 2013-06-06 MED ORDER — FLECAINIDE ACETATE 50 MG PO TABS: 50.0000 mg | ORAL_TABLET | Freq: Two times a day (BID) | ORAL | Status: DC

## 2013-06-06 NOTE — Progress Notes (Signed)
Patient ID: Kelsey Sandoval, female   DOB: Mar 01, 1933, 79 y.o.   MRN: [id number] Scleral hemorrhage

## 2013-06-06 NOTE — Patient Instructions (Addendum)
Remote monitoring is used to monitor your Pacemaker of ICD from home. This monitoring reduces the number of office visits required to check your device to one time per year. It allows [place] to keep an eye on the functioning of your device to ensure it is working properly. You are scheduled for a device check from home on 06-26-2013. You may send your transmission at any time that day. If you have a wireless device, the transmission will be sent automatically. After your physician reviews your transmission, you will receive a postcard with your next transmission date.  Your physician recommends that you schedule a follow-up appointment in: 1 year

## 2013-06-06 NOTE — Progress Notes (Signed)
Patient ID: Kelsey Sandoval, female   DOB: 08/31/1933, 79 y.o.   MRN: [id number]    Reason for office visit Followup atrial fibrillation and pacemaker  Kelsey Sandoval has done quite well since her last appointment. She recently had a spontaneous scleral hemorrhage but has no visual impairment a high pain. She has not had other bleeding problems. Her pacemaker has not shown recent episodes of atrial fibrillation and flecainide really seems to have made a big difference with virtual abolition of a symptomatic atrial fib in the last 2 years. Her pacemaker is being monitored via the remote Medtronic CareLink system and shows normal findings. She remains quite active. She generally feels very well.    Allergies  Allergen Reactions  . Levaquin [Levofloxacin In D5w]     nausea  . Penicillins   . Sulfa Antibiotics   . Zithromax [Azithromycin]     Nausea and rash    Current Outpatient Prescriptions  Medication Sig Dispense Refill  . ALPRAZolam (XANAX) 0.5 MG tablet One bid prn  60 tablet  4  . amitriptyline (ELAVIL) 50 MG tablet TAKE ONE TABLET BY MOUTH ONCE DAILY AT BEDTIME  30 tablet  5  . amLODipine (NORVASC) 10 MG tablet Take 1 tablet (10 mg total) by mouth daily.  30 tablet  0  . citalopram (CELEXA) 20 MG tablet TAKE ONE TABLET BY MOUTH ONCE DAILY  30 tablet  0  . dabigatran (PRADAXA) 150 MG CAPS Take 150 mg by mouth every 12 (twelve) hours.      . flecainide (TAMBOCOR) 50 MG tablet Take 1 tablet (50 mg total) by mouth 2 (two) times daily.  180 tablet  3  . HYDROcodone-acetaminophen (NORCO/VICODIN) 5-325 MG per tablet Take 1 tablet by mouth every 8 (eight) hours as needed for pain.  90 tablet  3  . lisinopril (PRINIVIL,ZESTRIL) 40 MG tablet Take 40 mg by mouth daily.      . meloxicam (MOBIC) 15 MG tablet Take 15 mg by mouth as needed.      . metoprolol succinate (TOPROL-XL) 100 MG 24 hr tablet Take 100 mg by mouth daily. Take with or immediately following a meal.      . ondansetron (ZOFRAN) 8 MG  tablet Take 1 tablet (8 mg total) by mouth every 8 (eight) hours as needed for nausea.  20 tablet  1  . pravastatin (PRAVACHOL) 80 MG tablet Take 1 tablet (80 mg total) by mouth daily.  30 tablet  5   No current facility-administered medications for this visit.    Past Medical History  Diagnosis Date  . Hypertension   . Dysrhythmia   . Anxiety   . Depression   . Pacemaker   . Arthritis   . Nephrotic syndrome   . Hyperlipidemia   . Pre-diabetes   . Osteopenia     Past Surgical History  Procedure Laterality Date  . Insert / replace / remove pacemaker    . Appendectomy    . Abdominal hysterectomy      partial-pt has no ovaries  . Back surgery    . Colonoscopy  9/05    No family history on file.  History   Social History  . Marital Status: Divorced    Spouse Name: N/A    Number of Children: N/A  . Years of Education: N/A   Occupational History  . Not on file.   Social History Main Topics  . Smoking status: Never Smoker   . Smokeless tobacco: Not on file  .  Alcohol Use: No  . Drug Use: No  . Sexual Activity: Not on file   Other Topics Concern  . Not on file   Social History Narrative  . No narrative on file    Review of systems: The patient specifically denies any chest pain at rest or with exertion, dyspnea at rest or with exertion, orthopnea, paroxysmal nocturnal dyspnea, syncope, palpitations, focal neurological deficits, intermittent claudication, lower extremity edema, unexplained weight gain, cough, hemoptysis or wheezing.  The patient also denies abdominal pain, nausea, vomiting, dysphagia, diarrhea, constipation, polyuria, polydipsia, dysuria, hematuria, frequency, urgency, abnormal bleeding or bruising, fever, chills, unexpected weight changes, mood swings, change in skin or hair texture, change in voice quality, auditory or visual problems, allergic reactions or rashes, new musculoskeletal complaints other than usual "aches and pains".   PHYSICAL  EXAM BP 116/78  Pulse 75  Resp 16  Ht 5\' 7"  (1.702 m)  Wt 171 lb 4.8 oz (77.701 kg)  BMI 26.82 kg/m2  General: Alert, oriented x3, no distress Head: no evidence of trauma, PERRL, EOMI, no exophtalmos or lid lag, no myxedema, no xanthelasma; normal ears, nose and oropharynx Neck: normal jugular venous pulsations and no hepatojugular reflux; brisk carotid pulses without delay and no carotid bruits Chest: clear to auscultation, no signs of consolidation by percussion or palpation, normal fremitus, symmetrical and full respiratory excursions; healthy left subclavian pacemaker site Cardiovascular: normal position and quality of the apical impulse, regular rhythm, normal first and second heart sounds, no rubs or gallops, 1/6 holosystolic apical murmur Abdomen: no tenderness or distention, no masses by palpation, no abnormal pulsatility or arterial bruits, normal bowel sounds, no hepatosplenomegaly Extremities: no clubbing, cyanosis or edema; 2+ radial, ulnar and brachial pulses bilaterally; 2+ right femoral, posterior tibial and dorsalis pedis pulses; 2+ left femoral, posterior tibial and dorsalis pedis pulses; no subclavian or femoral bruits Neurological: grossly nonfocal   EKG: Atrial paced ventricular sensed  Lipid Panel November 2013 HDL 45 LDL 64 triglycerides 265   BMET    Component Value Date/Time   NA 140 02/20/2013 1145   K 4.2 02/20/2013 1145   CL 109 02/20/2013 1145   CO2 22 02/20/2013 1145   GLUCOSE 88 02/20/2013 1145   BUN 21 02/20/2013 1145   CREATININE 1.02 02/20/2013 1145   CREATININE 0.97 05/01/2011 1652   CALCIUM 9.0 02/20/2013 1145   GFRNONAA 56* 05/01/2011 1652   GFRAA >60 05/01/2011 1652     ASSESSMENT AND PLAN Paroxysmal atrial fibrillation No recent symptomatic events. Only very brief episodes of mode switch recorded by the pacemaker. Note that prior to flecainide therapy she had a roughly 5% burden of atrial fibrillation. No history of embolic TIA or stroke. No  serious bleeding complications, although she has scleral hemorrhage. Tolerating flecainide well. Normal nuclear stress test at therapy initiation 2011. Consider periodic reevaluation to make sure that flecainide is safe to use in this septuagenarian.  Tachycardia-bradycardia syndrome    Pacemaker Normal function of dual-chamber Medtronic device implanted in August of 2012. Last remote CareLink check performed on June 30, next due at the end of this month. Good lead parameters and battery voltage. Rare episodes of mode switch lasting less than a minute have been detected. No meaningful atrial fibrillation since September of 2012 on flecainide was started. Roughly 17% atrial pacing, no ventricular paced  Mitral insufficiency This was estimated to be moderate at initial evaluation but only mild by transesophageal echo  Dyslipidemia At her last check she had excellent total cholesterol, LDL  cholesterol and HDL levels but had mildly elevated triglycerides. We'll try to get more recent labs that have been performed by her primary care physician.  Systemic hypertension Excellent control  Orders Placed This Encounter  Procedures  . EKG 12-Lead   Meds ordered this encounter  Medications  . flecainide (TAMBOCOR) 50 MG tablet    Sig: Take 1 tablet (50 mg total) by mouth 2 (two) times daily.    Dispense:  180 tablet    Refill:  Cherry Alayasia Breeding, MD, [place] [phone] office [phone] pager

## 2013-06-11 ENCOUNTER — Encounter: Payer: Self-pay | Admitting: Cardiovascular Disease

## 2013-06-11 DIAGNOSIS — Z950 Presence of cardiac pacemaker: Secondary | ICD-10-CM | POA: Insufficient documentation

## 2013-06-11 DIAGNOSIS — I340 Nonrheumatic mitral (valve) insufficiency: Secondary | ICD-10-CM | POA: Insufficient documentation

## 2013-06-11 NOTE — Assessment & Plan Note (Signed)
No recent symptomatic events. Only very brief episodes of mode switch recorded by the pacemaker. Note that prior to flecainide therapy she had a roughly 5% burden of atrial fibrillation. No history of embolic TIA or stroke. No serious bleeding complications, although she has scleral hemorrhage. Tolerating flecainide well. Normal nuclear stress test at therapy initiation 2011. Consider periodic reevaluation to make sure that flecainide is safe to use in this septuagenarian.

## 2013-06-11 NOTE — Assessment & Plan Note (Signed)
At her last check she had excellent total cholesterol, LDL cholesterol and HDL levels but had mildly elevated triglycerides. We'll try to get more recent labs that have been performed by her primary care physician.

## 2013-06-11 NOTE — Assessment & Plan Note (Signed)
Normal function of dual-chamber Medtronic device implanted in August of 2012. Last remote CareLink check performed on June 30, next due at the end of this month. Good lead parameters and battery voltage. Rare episodes of mode switch lasting less than a minute have been detected. No meaningful atrial fibrillation since September of 2012 on flecainide was started. Roughly 17% atrial pacing, no ventricular paced

## 2013-06-11 NOTE — Assessment & Plan Note (Signed)
This was estimated to be moderate at initial evaluation but only mild by transesophageal echo

## 2013-06-11 NOTE — Assessment & Plan Note (Signed)
Excellent control.   

## 2013-06-28 ENCOUNTER — Other Ambulatory Visit: Payer: Self-pay | Admitting: Family Medicine

## 2013-07-11 ENCOUNTER — Encounter: Payer: Self-pay | Admitting: Family Medicine

## 2013-07-11 ENCOUNTER — Ambulatory Visit (INDEPENDENT_AMBULATORY_CARE_PROVIDER_SITE_OTHER): Payer: Medicare Other | Admitting: Family Medicine

## 2013-07-11 VITALS — BP 120/80 | Ht 70.0 in | Wt 171.5 lb

## 2013-07-11 DIAGNOSIS — M899 Disorder of bone, unspecified: Secondary | ICD-10-CM

## 2013-07-11 DIAGNOSIS — Z79899 Other long term (current) drug therapy: Secondary | ICD-10-CM | POA: Diagnosis not present

## 2013-07-11 DIAGNOSIS — I10 Essential (primary) hypertension: Secondary | ICD-10-CM | POA: Diagnosis not present

## 2013-07-11 DIAGNOSIS — E785 Hyperlipidemia, unspecified: Secondary | ICD-10-CM | POA: Diagnosis not present

## 2013-07-11 DIAGNOSIS — R7301 Impaired fasting glucose: Secondary | ICD-10-CM | POA: Diagnosis not present

## 2013-07-11 DIAGNOSIS — Z23 Encounter for immunization: Secondary | ICD-10-CM | POA: Diagnosis not present

## 2013-07-11 DIAGNOSIS — M8580 Other specified disorders of bone density and structure, unspecified site: Secondary | ICD-10-CM

## 2013-07-11 MED ORDER — HYDROCODONE-ACETAMINOPHEN 5-325 MG PO TABS: 1.0000 | ORAL_TABLET | Freq: Three times a day (TID) | ORAL | Status: DC | PRN

## 2013-07-11 MED ORDER — ALPRAZOLAM 0.5 MG PO TABS: ORAL_TABLET | ORAL | Status: DC

## 2013-07-11 NOTE — Patient Instructions (Signed)
DASH Diet  The DASH diet stands for "Dietary Approaches to Stop Hypertension." It is a healthy eating plan that has been shown to reduce high blood pressure (hypertension) in as little as 14 days, while also possibly providing other significant health benefits. These other health benefits include reducing the risk of breast cancer after menopause and reducing the risk of type 2 diabetes, heart disease, colon cancer, and stroke. Health benefits also include weight loss and slowing kidney failure in patients with chronic kidney disease.   DIET GUIDELINES  · Limit salt (sodium). Your diet should contain less than 1500 mg of sodium daily.  · Limit refined or processed carbohydrates. Your diet should include mostly whole grains. Desserts and added sugars should be used sparingly.  · Include small amounts of heart-healthy fats. These types of fats include nuts, oils, and tub margarine. Limit saturated and trans fats. These fats have been shown to be harmful in the body.  CHOOSING FOODS   The following food groups are based on a 2000 calorie diet. See your Registered Dietitian for individual calorie needs.  Grains and Grain Products (6 to 8 servings daily)  · Eat More Often: Whole-wheat bread, brown rice, whole-grain or wheat pasta, quinoa, popcorn without added fat or salt (air popped).  · Eat Less Often: White bread, white pasta, white rice, cornbread.  Vegetables (4 to 5 servings daily)  · Eat More Often: Fresh, frozen, and canned vegetables. Vegetables may be raw, steamed, roasted, or grilled with a minimal amount of fat.  · Eat Less Often/Avoid: Creamed or fried vegetables. Vegetables in a cheese sauce.  Fruit (4 to 5 servings daily)  · Eat More Often: All fresh, canned (in natural juice), or frozen fruits. Dried fruits without added sugar. One hundred percent fruit juice (½ cup [237 mL] daily).  · Eat Less Often: Dried fruits with added sugar. Canned fruit in light or heavy syrup.  Lean Meats, Fish, and Poultry (2  servings or less daily. One serving is 3 to 4 oz [85-114 g]).  · Eat More Often: Ninety percent or leaner ground beef, tenderloin, sirloin. Round cuts of beef, chicken breast, turkey breast. All fish. Grill, bake, or broil your meat. Nothing should be fried.  · Eat Less Often/Avoid: Fatty cuts of meat, turkey, or chicken leg, thigh, or wing. Fried cuts of meat or fish.  Dairy (2 to 3 servings)  · Eat More Often: Low-fat or fat-free milk, low-fat plain or light yogurt, reduced-fat or part-skim cheese.  · Eat Less Often/Avoid: Milk (whole, 2%). Whole milk yogurt. Full-fat cheeses.  Nuts, Seeds, and Legumes (4 to 5 servings per week)  · Eat More Often: All without added salt.  · Eat Less Often/Avoid: Salted nuts and seeds, canned beans with added salt.  Fats and Sweets (limited)  · Eat More Often: Vegetable oils, tub margarines without trans fats, sugar-free gelatin. Mayonnaise and salad dressings.  · Eat Less Often/Avoid: Coconut oils, palm oils, butter, stick margarine, cream, half and half, cookies, candy, pie.  FOR MORE INFORMATION  The Dash Diet Eating Plan: www.dashdiet.org  Document Released: 09/10/2011 Document Revised: 12/14/2011 Document Reviewed: 09/10/2011  [employer]® Patient Information ©2014 [employer].

## 2013-07-11 NOTE — Progress Notes (Signed)
  Subjective:    Patient ID: [name], female    DOB: 08/30/1933, 79 y.o.   MRN: [id number]  Hypertension This is a chronic problem. The current episode started more than 1 year ago. The problem is unchanged. The problem is controlled. There are no associated agents to hypertension. There are no known risk factors for coronary artery disease. Treatments tried: amlodipine. The current treatment provides significant improvement. There are no compliance problems.    Patient is concerned about weight loss. She states that her clothes are very loose on her and she should not be losing weight at this rate.  This patient was seen today for chronic pain  The medication list was reviewed and updated.  Discussion was held with the patient regarding compliance with pain medication. The patient was advised the importance of maintaining medication and not using illegal substances with these. The patient was educated that we can provide 3 monthly scripts for their medication, it is their responsibility to follow the instructions. Discussion was held with the patient to make sure they're not having significant side effects. Patient is aware that pain medications are meant to minimize the severity of the pain to allow their pain levels to improve to allow for better function. They are aware of that pain medications cannot totally remove their pain.     Review of Systems     Objective:   Physical Exam Lungs are clear hearts regular pulse normal blood pressure good extremities no edema skin warm dry patient also has chronic severe osteoarthritis of the knees as well as subjective arthritis of the lower back       Assessment & Plan:  #1 chronic pain continue current medicine she does not abuse it. #2 chronic anxiety refills given #3 weight loss-we will do some additional lab work as well as a chest x-ray follow her up in 4 weeks' time along with Hemoccult cards x3 #4 hyperlipidemia check lipid  profile #5 hypertension urine micro-protein along with a metabolic 7

## 2013-07-12 ENCOUNTER — Other Ambulatory Visit: Payer: Self-pay | Admitting: *Deleted

## 2013-07-12 ENCOUNTER — Other Ambulatory Visit: Payer: Self-pay | Admitting: Cardiovascular Disease

## 2013-07-12 DIAGNOSIS — R5381 Other malaise: Secondary | ICD-10-CM

## 2013-07-12 DIAGNOSIS — R634 Abnormal weight loss: Secondary | ICD-10-CM

## 2013-07-13 ENCOUNTER — Ambulatory Visit (HOSPITAL_COMMUNITY)
Admission: RE | Admit: 2013-07-13 | Discharge: 2013-07-13 | Disposition: A | Discharge status: Home / Self Care | Payer: Medicare Other | Source: Ambulatory Visit | Attending: Family Medicine | Admitting: Family Medicine

## 2013-07-13 DIAGNOSIS — R634 Abnormal weight loss: Secondary | ICD-10-CM | POA: Insufficient documentation

## 2013-07-13 DIAGNOSIS — R7301 Impaired fasting glucose: Secondary | ICD-10-CM | POA: Diagnosis not present

## 2013-07-13 DIAGNOSIS — J40 Bronchitis, not specified as acute or chronic: Secondary | ICD-10-CM | POA: Diagnosis not present

## 2013-07-13 DIAGNOSIS — E785 Hyperlipidemia, unspecified: Secondary | ICD-10-CM | POA: Diagnosis not present

## 2013-07-13 DIAGNOSIS — M899 Disorder of bone, unspecified: Secondary | ICD-10-CM | POA: Diagnosis not present

## 2013-07-13 DIAGNOSIS — I10 Essential (primary) hypertension: Secondary | ICD-10-CM | POA: Insufficient documentation

## 2013-07-13 DIAGNOSIS — Z79899 Other long term (current) drug therapy: Secondary | ICD-10-CM | POA: Diagnosis not present

## 2013-07-13 DIAGNOSIS — R5381 Other malaise: Secondary | ICD-10-CM | POA: Diagnosis not present

## 2013-07-13 DIAGNOSIS — J438 Other emphysema: Secondary | ICD-10-CM | POA: Diagnosis not present

## 2013-07-13 LAB — LIPID PANEL
Cholesterol: 172 mg/dL (ref 0–200)
VLDL: 50 mg/dL — ABNORMAL HIGH (ref 0–40)

## 2013-07-13 LAB — BASIC METABOLIC PANEL
BUN: 23 mg/dL (ref 6–23)
Chloride: 108 mEq/L (ref 96–112)
Glucose, Bld: 97 mg/dL (ref 70–99)
Potassium: 4.4 mEq/L (ref 3.5–5.3)

## 2013-07-13 LAB — HEPATIC FUNCTION PANEL
ALT: 14 U/L (ref 0–35)
AST: 25 U/L (ref 0–37)
Albumin: 3.3 g/dL — ABNORMAL LOW (ref 3.5–5.2)
Bilirubin, Direct: 0.1 mg/dL (ref 0.0–0.3)
Total Bilirubin: 0.4 mg/dL (ref 0.3–1.2)

## 2013-07-13 LAB — CBC WITH DIFFERENTIAL/PLATELET
Basophils Absolute: 0 10*3/uL (ref 0.0–0.1)
Basophils Relative: 0 % (ref 0–1)
Eosinophils Absolute: 0.1 10*3/uL (ref 0.0–0.7)
Eosinophils Relative: 1 % (ref 0–5)
Hemoglobin: 11.4 g/dL — ABNORMAL LOW (ref 12.0–15.0)
MCH: 30.4 pg (ref 26.0–34.0)
MCHC: 34.4 g/dL (ref 30.0–36.0)
MCV: 88.3 fL (ref 78.0–100.0)
Monocytes Absolute: 0.5 10*3/uL (ref 0.1–1.0)
Monocytes Relative: 7 % (ref 3–12)
Neutro Abs: 4.5 10*3/uL (ref 1.7–7.7)
Neutrophils Relative %: 67 % (ref 43–77)
RDW: 13.9 % (ref 11.5–15.5)

## 2013-07-14 LAB — MICROALBUMIN, URINE: Microalb, Ur: 158.86 mg/dL — ABNORMAL HIGH (ref 0.00–1.89)

## 2013-07-14 LAB — VITAMIN D 25 HYDROXY (VIT D DEFICIENCY, FRACTURES): Vit D, 25-Hydroxy: 32 ng/mL (ref 30–89)

## 2013-07-18 ENCOUNTER — Other Ambulatory Visit: Payer: Self-pay | Admitting: *Deleted

## 2013-07-18 DIAGNOSIS — D649 Anemia, unspecified: Secondary | ICD-10-CM

## 2013-07-21 ENCOUNTER — Other Ambulatory Visit (INDEPENDENT_AMBULATORY_CARE_PROVIDER_SITE_OTHER): Payer: Medicare Other | Admitting: *Deleted

## 2013-07-21 DIAGNOSIS — Z0000 Encounter for general adult medical examination without abnormal findings: Secondary | ICD-10-CM

## 2013-07-21 DIAGNOSIS — Z1211 Encounter for screening for malignant neoplasm of colon: Secondary | ICD-10-CM

## 2013-07-21 LAB — POC HEMOCCULT BLD/STL (HOME/3-CARD/SCREEN)
Card #2 Fecal Occult Blod, POC: NEGATIVE
Card #3 Fecal Occult Blood, POC: NEGATIVE
Fecal Occult Blood, POC: NEGATIVE

## 2013-07-24 DIAGNOSIS — D649 Anemia, unspecified: Secondary | ICD-10-CM | POA: Diagnosis not present

## 2013-07-24 LAB — IRON AND TIBC
TIBC: 355 ug/dL (ref 250–470)
UIBC: 241 ug/dL (ref 125–400)

## 2013-07-31 ENCOUNTER — Other Ambulatory Visit: Payer: Self-pay | Admitting: Family Medicine

## 2013-07-31 DIAGNOSIS — D509 Iron deficiency anemia, unspecified: Secondary | ICD-10-CM

## 2013-08-10 ENCOUNTER — Encounter (INDEPENDENT_AMBULATORY_CARE_PROVIDER_SITE_OTHER): Payer: Self-pay | Admitting: *Deleted

## 2013-08-22 ENCOUNTER — Telehealth (INDEPENDENT_AMBULATORY_CARE_PROVIDER_SITE_OTHER): Payer: Self-pay | Admitting: *Deleted

## 2013-08-22 ENCOUNTER — Telehealth: Payer: Self-pay | Admitting: Cardiovascular Disease

## 2013-08-22 ENCOUNTER — Ambulatory Visit (INDEPENDENT_AMBULATORY_CARE_PROVIDER_SITE_OTHER): Payer: Medicare Other | Admitting: Internal Medicine

## 2013-08-22 ENCOUNTER — Encounter (INDEPENDENT_AMBULATORY_CARE_PROVIDER_SITE_OTHER): Payer: Self-pay | Admitting: Internal Medicine

## 2013-08-22 ENCOUNTER — Other Ambulatory Visit (INDEPENDENT_AMBULATORY_CARE_PROVIDER_SITE_OTHER): Payer: Self-pay | Admitting: *Deleted

## 2013-08-22 VITALS — BP 138/66 | HR 88 | Temp 98.6°F | Ht 69.0 in | Wt 171.1 lb

## 2013-08-22 DIAGNOSIS — D509 Iron deficiency anemia, unspecified: Secondary | ICD-10-CM

## 2013-08-22 DIAGNOSIS — Z1211 Encounter for screening for malignant neoplasm of colon: Secondary | ICD-10-CM

## 2013-08-22 NOTE — Telephone Encounter (Signed)
Patient needs movi prep 

## 2013-08-22 NOTE — Telephone Encounter (Signed)
Pt scheduled for a colonoscopy on 10-11-12. Wants to know if she can stop her Pradaxa 2 days before procedure?

## 2013-08-22 NOTE — Progress Notes (Signed)
Subjective:     Patient ID: Kelsey Sandoval, female   DOB: 1933-08-06, 79 y.o.   MRN: [id number]  HPI Referred to our office by Dr. Sallee Sandoval for iron deficiency anemia. She was guaiac negative in Dr. Lance Sandoval office. Her last colonoscopy was in 2005. Please see below. She tells me she has had some weight loss. She thinks she has lost about 12 pounds in the past year. Appetite is good. No abdominal pain. No dysphagia. Rarely has acid reflux.  She usually has a BM daily. Brown in color. Green if she eats turnip greens.  She denies melena or bright red rectal bleeding. She says she feels tired all the time.      06/2004 Colonoscopy: Kelsey Sandoval: Total colonoscopy.  INDICATIONS: Kelsey Sandoval is a 79 year old Caucasian female who had an episode  of acute illness with nausea, vomiting, diarrhea which eventually turned  bloody. She has recovered from this illness  IMPRESSION: Left colonic diverticulosis and small external hemorrhoids.  Suspect she had self-limiting colitis.  CBC    Component Value Date/Time   WBC 6.7 07/12/2013 0947   RBC 3.75* 07/12/2013 0947   HGB 11.4* 07/12/2013 0947   HCT 33.1* 07/12/2013 0947   PLT 260 07/12/2013 0947   MCV 88.3 07/12/2013 0947   MCH 30.4 07/12/2013 0947   MCHC 34.4 07/12/2013 0947   RDW 13.9 07/12/2013 0947   LYMPHSABS 1.7 07/12/2013 0947   MONOABS 0.5 07/12/2013 0947   EOSABS 0.1 07/12/2013 0947   BASOSABS 0.0 07/12/2013 0947   07/24/2014 Ferritin 26, TIBC 355, Iron 114, UIBC 241  Review of Systems see hpi Current Outpatient Prescriptions  Medication Sig Dispense Refill  . ALPRAZolam (XANAX) 0.5 MG tablet One bid prn  60 tablet  4  . amitriptyline (ELAVIL) 50 MG tablet TAKE ONE TABLET BY MOUTH ONCE DAILY AT BEDTIME  30 tablet  5  . amLODipine (NORVASC) 10 MG tablet TAKE ONE TABLET BY MOUTH ONCE DAILY  30 tablet  3  . citalopram (CELEXA) 20 MG tablet TAKE ONE TABLET BY MOUTH ONCE DAILY  30 tablet  3  . flecainide (TAMBOCOR) 50 MG tablet Take 1  tablet (50 mg total) by mouth 2 (two) times daily.  180 tablet  3  . HYDROcodone-acetaminophen (NORCO/VICODIN) 5-325 MG per tablet Take 1 tablet by mouth every 8 (eight) hours as needed for pain.  90 tablet  0  . lisinopril (PRINIVIL,ZESTRIL) 40 MG tablet Take 40 mg by mouth daily.      . meloxicam (MOBIC) 15 MG tablet Take 15 mg by mouth as needed.      . metoprolol succinate (TOPROL-XL) 100 MG 24 hr tablet Take 100 mg by mouth daily. Take with or immediately following a meal.      . PRADAXA 150 MG CAPS capsule TAKE ONE CAPSULE BY MOUTH TWICE DAILY  60 capsule  5  . pravastatin (PRAVACHOL) 80 MG tablet Take 1 tablet (80 mg total) by mouth daily.  30 tablet  5  . vitamin B-12 (CYANOCOBALAMIN) 1000 MCG tablet Take 1,000 mcg by mouth 2 (two) times daily.       No current facility-administered medications for this visit.   Past Medical History  Diagnosis Date  . Hypertension   . Dysrhythmia   . Anxiety   . Depression   . Pacemaker   . Arthritis   . Nephrotic syndrome   . Hyperlipidemia   . Pre-diabetes   . Osteopenia   . Atrial fibrillation   . Back  pain, chronic    Past Surgical History  Procedure Laterality Date  . Insert / replace / remove pacemaker    . Appendectomy    . Abdominal hysterectomy      partial-pt has no ovaries  . Back surgery    . Colonoscopy  9/05  . Dual chamber pacemaker      2012   Allergies  Allergen Reactions  . Levaquin [Levofloxacin In D5w]     nausea  . Penicillins   . Sulfa Antibiotics   . Zithromax [Azithromycin]     Nausea and rash  '      Objective:   Physical Exam  Filed Vitals:   08/22/13 1420  BP: 138/66  Pulse: 88  Temp: 98.6 F (37 C)  Height: 5\' 9"  (1.753 m)  Weight: 171 lb 1.6 oz (77.61 kg)   Alert and oriented. Skin warm and dry. Oral mucosa is moist.   . Sclera anicteric, conjunctivae is pink. Thyroid not enlarged. No cervical lymphadenopathy. Lungs clear. Heart regular rate and rhythm.  Abdomen is soft. Bowel sounds are  positive. No hepatomegaly. No abdominal masses felt. No tenderness.  No edema to lower extremities.      Assessment:    Iron deficiency anemia (mild). Last colonoscopy was in 2005.  Colonic neoplasm needs to be ruled out.     Plan:    Colonoscopy with Dr. Laural Sandoval. The risks and benefits such as perforation, bleeding, and infection were reviewed with the patient and is agreeable.

## 2013-08-22 NOTE — Telephone Encounter (Signed)
Per Epic, colonoscopy scheduled for 1.7.15.  Message forwarded to Dr. Darlen Round, CMA.

## 2013-08-22 NOTE — Patient Instructions (Signed)
Colonoscopy with Dr. Rehman. The risks and benefits such as perforation, bleeding, and infection were reviewed with the patient and is agreeable. 

## 2013-08-22 NOTE — Telephone Encounter (Signed)
Yes, okay to stop Pradaxa prior to colonoscopy.

## 2013-08-22 NOTE — Telephone Encounter (Signed)
Letter authorizing pradaxa to be held 2 days prior to her Colonoscopy.  Letter sent electronically to Dr. Laural Golden.

## 2013-08-23 ENCOUNTER — Other Ambulatory Visit: Payer: Self-pay | Admitting: Family Medicine

## 2013-08-23 ENCOUNTER — Telehealth: Payer: Self-pay | Admitting: Family Medicine

## 2013-08-23 MED ORDER — PEG-KCL-NACL-NASULF-NA ASC-C 100 G PO SOLR: 1.0000 | Freq: Once | ORAL | Status: DC

## 2013-08-23 NOTE — Telephone Encounter (Signed)
Review scripts for patient medications.

## 2013-08-23 NOTE — Progress Notes (Signed)
I received a note regarding meloxicam use and hypertension from her pharmacy group. I am aware of the potential interactions. Also the note regarded amitriptyline use. We will discuss this on her next office visit. Thank you

## 2013-08-28 ENCOUNTER — Other Ambulatory Visit: Payer: Self-pay | Admitting: Family Medicine

## 2013-09-05 ENCOUNTER — Other Ambulatory Visit (HOSPITAL_COMMUNITY): Payer: Self-pay | Admitting: Family Medicine

## 2013-09-18 ENCOUNTER — Other Ambulatory Visit: Payer: Self-pay | Admitting: *Deleted

## 2013-09-18 MED ORDER — METOPROLOL SUCCINATE ER 50 MG PO TB24: 50.0000 mg | ORAL_TABLET | Freq: Every day | ORAL | Status: DC

## 2013-09-18 MED ORDER — LISINOPRIL 40 MG PO TABS: 40.0000 mg | ORAL_TABLET | Freq: Every day | ORAL | Status: DC

## 2013-09-18 NOTE — Telephone Encounter (Signed)
Rx was sent to pharmacy electronically. 

## 2013-09-19 ENCOUNTER — Telehealth: Payer: Self-pay | Admitting: Family Medicine

## 2013-09-19 MED ORDER — MECLIZINE HCL 25 MG PO TABS: 25.0000 mg | ORAL_TABLET | Freq: Three times a day (TID) | ORAL | Status: DC | PRN

## 2013-09-19 MED ORDER — PROMETHAZINE HCL 25 MG RE SUPP: 25.0000 mg | Freq: Three times a day (TID) | RECTAL | Status: DC | PRN

## 2013-09-19 NOTE — Telephone Encounter (Signed)
Medication was sent to pharmacy. Patient was notified.

## 2013-09-19 NOTE — Telephone Encounter (Signed)
Pt states she is having some inner ear difficulty and wants to know if you will call in some suppositories for her?   Phenadoz 25mg ?   [facility]

## 2013-09-19 NOTE — Telephone Encounter (Signed)
Meclizine 25mg  , one tid prn #21. Phenergan suppos 25 mg, #8 one tid prn, 1 refill Caution drowsiness. F/u prn

## 2013-09-19 NOTE — Telephone Encounter (Signed)
NTC plz clarify what is up

## 2013-09-19 NOTE — Telephone Encounter (Signed)
Patient states that she suffers from vertigo. She is experiencing dizziness and nausea. She needs some suppositories sent in to pharmacy that you prescribed her in the past for the issue.

## 2013-09-19 NOTE — Telephone Encounter (Signed)
Patient would like a call back

## 2013-09-20 ENCOUNTER — Ambulatory Visit (INDEPENDENT_AMBULATORY_CARE_PROVIDER_SITE_OTHER): Payer: Medicare Other | Admitting: Nurse Practitioner

## 2013-09-20 ENCOUNTER — Encounter: Payer: Self-pay | Admitting: Nurse Practitioner

## 2013-09-20 VITALS — BP 128/82 | Temp 98.5°F | Ht 70.0 in | Wt 166.4 lb

## 2013-09-20 DIAGNOSIS — J069 Acute upper respiratory infection, unspecified: Secondary | ICD-10-CM | POA: Diagnosis not present

## 2013-09-20 DIAGNOSIS — J209 Acute bronchitis, unspecified: Secondary | ICD-10-CM

## 2013-09-20 DIAGNOSIS — R062 Wheezing: Secondary | ICD-10-CM

## 2013-09-20 MED ORDER — DOXYCYCLINE HYCLATE 100 MG PO CAPS: 100.0000 mg | ORAL_CAPSULE | Freq: Two times a day (BID) | ORAL | Status: DC

## 2013-09-20 MED ORDER — METHYLPREDNISOLONE ACETATE 40 MG/ML IJ SUSP
40.0000 mg | Freq: Once | INTRAMUSCULAR | Status: AC
  Administered 2013-09-20: 40 mg via INTRAMUSCULAR

## 2013-09-20 MED ORDER — PREDNISONE 20 MG PO TABS: ORAL_TABLET | ORAL | Status: DC

## 2013-09-20 MED ORDER — HYDROCOD POLST-CHLORPHEN POLST 10-8 MG/5ML PO LQCR: 5.0000 mL | Freq: Two times a day (BID) | ORAL | Status: DC | PRN

## 2013-09-22 ENCOUNTER — Encounter: Payer: Self-pay | Admitting: Nurse Practitioner

## 2013-09-22 NOTE — Progress Notes (Signed)
Subjective:  Presents for complaints of cough and congestion for the past 3 days. Frequent cough. Producing green sputum. Slight runny nose. Ear pain and pressure. Sore throat. No fever. No headache. No vomiting diarrhea or abdominal pain. Used her albuterol twice today with slight relief of wheezing. The last time was about 2 hours ago.  Objective:   BP 128/82  Temp(Src) 98.5 F (36.9 C) (Oral)  Ht 5\' 10"  (1.778 m)  Wt 166 lb 6.4 oz (75.479 kg)  BMI 23.88 kg/m2 NAD. Alert, oriented. TMs clear effusion, no erythema. Pharynx injected with PND noted. Neck supple with mild soft nontender adenopathy. Lungs rare faint expiratory wheeze. No tachypnea. Heart regular rate rhythm.  Assessment:Acute upper respiratory infections of unspecified site - Plan: methylPREDNISolone acetate (DEPO-MEDROL) injection 40 mg  Acute bronchitis - Plan: methylPREDNISolone acetate (DEPO-MEDROL) injection 40 mg  Wheezing - Plan: methylPREDNISolone acetate (DEPO-MEDROL) injection 40 mg  Plan: Meds ordered this encounter  Medications  . predniSONE (DELTASONE) 20 MG tablet    Sig: 3 po qd x 3 d then 2 po qd x 3 d then 1 po qd x 3 d; start 12/18 AM    Dispense:  18 tablet    Refill:  0    Order Specific Question:  Supervising Provider    Answer:  Mikey Kirschner [[id number]]  . methylPREDNISolone acetate (DEPO-MEDROL) injection 40 mg    Sig:   . doxycycline (VIBRAMYCIN) 100 MG capsule    Sig: Take 1 capsule (100 mg total) by mouth 2 (two) times daily.    Dispense:  20 capsule    Refill:  0    Order Specific Question:  Supervising Provider    Answer:  Mikey Kirschner [[id number]]  . chlorpheniramine-HYDROcodone (TUSSIONEX) 10-8 MG/5ML LQCR    Sig: Take 5 mLs by mouth every 12 (twelve) hours as needed for cough.    Dispense:  115 mL    Refill:  0    Order Specific Question:  Supervising Provider    Answer:  Mikey Kirschner [[id number]]   Mucinex DM for daytime use. Warning signs reviewed. Call back if worsens or  persists.

## 2013-09-26 ENCOUNTER — Encounter (HOSPITAL_COMMUNITY): Payer: Self-pay | Admitting: Pharmacy Technician

## 2013-10-11 ENCOUNTER — Encounter (HOSPITAL_COMMUNITY): Payer: Self-pay | Admitting: *Deleted

## 2013-10-11 ENCOUNTER — Encounter (HOSPITAL_COMMUNITY)
Admission: RE | Disposition: A | Discharge status: Home / Self Care | Payer: Self-pay | Source: Ambulatory Visit | Attending: Internal Medicine

## 2013-10-11 ENCOUNTER — Ambulatory Visit (HOSPITAL_COMMUNITY)
Admission: RE | Admit: 2013-10-11 | Discharge: 2013-10-11 | Disposition: A | Discharge status: Home / Self Care | Payer: Medicare Other | Source: Ambulatory Visit | Attending: Internal Medicine | Admitting: Internal Medicine

## 2013-10-11 DIAGNOSIS — Z800 Family history of malignant neoplasm of digestive organs: Secondary | ICD-10-CM | POA: Diagnosis not present

## 2013-10-11 DIAGNOSIS — Z79899 Other long term (current) drug therapy: Secondary | ICD-10-CM | POA: Diagnosis not present

## 2013-10-11 DIAGNOSIS — K5730 Diverticulosis of large intestine without perforation or abscess without bleeding: Secondary | ICD-10-CM | POA: Diagnosis not present

## 2013-10-11 DIAGNOSIS — R7309 Other abnormal glucose: Secondary | ICD-10-CM | POA: Diagnosis not present

## 2013-10-11 DIAGNOSIS — I10 Essential (primary) hypertension: Secondary | ICD-10-CM | POA: Insufficient documentation

## 2013-10-11 DIAGNOSIS — D509 Iron deficiency anemia, unspecified: Secondary | ICD-10-CM | POA: Diagnosis not present

## 2013-10-11 HISTORY — PX: COLONOSCOPY: SHX5424

## 2013-10-11 SURGERY — COLONOSCOPY
Anesthesia: Moderate Sedation

## 2013-10-11 MED ORDER — MEPERIDINE HCL 50 MG/ML IJ SOLN
INTRAMUSCULAR | Status: AC
  Filled 2013-10-11: qty 1

## 2013-10-11 MED ORDER — MIDAZOLAM HCL 5 MG/5ML IJ SOLN
INTRAMUSCULAR | Status: DC | PRN
  Administered 2013-10-11 (×2): 2 mg via INTRAVENOUS
  Administered 2013-10-11: 1 mg via INTRAVENOUS
  Administered 2013-10-11 (×5): 2 mg via INTRAVENOUS

## 2013-10-11 MED ORDER — PROMETHAZINE HCL 25 MG/ML IJ SOLN
INTRAMUSCULAR | Status: AC
  Filled 2013-10-11: qty 1

## 2013-10-11 MED ORDER — MEPERIDINE HCL 50 MG/ML IJ SOLN
INTRAMUSCULAR | Status: DC | PRN
  Administered 2013-10-11 (×2): 25 mg via INTRAVENOUS

## 2013-10-11 MED ORDER — STERILE WATER FOR IRRIGATION IR SOLN
Status: DC | PRN
  Administered 2013-10-11: 12:00:00

## 2013-10-11 MED ORDER — MIDAZOLAM HCL 5 MG/5ML IJ SOLN
INTRAMUSCULAR | Status: AC
  Filled 2013-10-11: qty 10

## 2013-10-11 MED ORDER — PROMETHAZINE HCL 25 MG/ML IJ SOLN
INTRAMUSCULAR | Status: DC | PRN
  Administered 2013-10-11: 12.5 mg via INTRAVENOUS

## 2013-10-11 MED ORDER — SODIUM CHLORIDE 0.9 % IV SOLN
INTRAVENOUS | Status: DC
  Administered 2013-10-11: 12:00:00 via INTRAVENOUS

## 2013-10-11 MED ORDER — SODIUM CHLORIDE 0.9 % IJ SOLN
INTRAMUSCULAR | Status: AC
  Filled 2013-10-11: qty 10

## 2013-10-11 MED ORDER — MIDAZOLAM HCL 5 MG/5ML IJ SOLN
INTRAMUSCULAR | Status: AC
  Filled 2013-10-11: qty 5

## 2013-10-11 NOTE — Discharge Instructions (Signed)
Resume usual medications including Pradaxa. High fiber diet. No driving for 24 hours.   High-Fiber Diet Fiber is found in fruits, vegetables, and grains. A high-fiber diet encourages the addition of more whole grains, legumes, fruits, and vegetables in your diet. The recommended amount of fiber for adult males is 38 g per day. For adult females, it is 25 g per day. Pregnant and lactating women should get 28 g of fiber per day. If you have a digestive or bowel problem, ask your caregiver for advice before adding high-fiber foods to your diet. Eat a variety of high-fiber foods instead of only a select few type of foods.  PURPOSE  To increase stool bulk.  To make bowel movements more regular to prevent constipation.  To lower cholesterol.  To prevent overeating. WHEN IS THIS DIET USED?  It may be used if you have constipation and hemorrhoids.  It may be used if you have uncomplicated diverticulosis (intestine condition) and irritable bowel syndrome.  It may be used if you need help with weight management.  It may be used if you want to add it to your diet as a protective measure against atherosclerosis, diabetes, and cancer. SOURCES OF FIBER  Whole-grain breads and cereals.  Fruits, such as apples, oranges, bananas, berries, prunes, and pears.  Vegetables, such as green peas, carrots, sweet potatoes, beets, broccoli, cabbage, spinach, and artichokes.  Legumes, such split peas, soy, lentils.  Almonds. FIBER CONTENT IN FOODS Starches and Grains / Dietary Fiber (g)  Cheerios, 1 cup / 3 g  Corn Flakes cereal, 1 cup / 0.7 g  Rice crispy treat cereal, 1 cup / 0.3 g  Instant oatmeal (cooked),  cup / 2 g  Frosted wheat cereal, 1 cup / 5.1 g  Brown, long-grain rice (cooked), 1 cup / 3.5 g  White, long-grain rice (cooked), 1 cup / 0.6 g  Enriched macaroni (cooked), 1 cup / 2.5 g Legumes / Dietary Fiber (g)  Baked beans (canned, plain, or vegetarian),  cup / 5.2  g  Kidney beans (canned),  cup / 6.8 g  Pinto beans (cooked),  cup / 5.5 g Breads and Crackers / Dietary Fiber (g)  Plain or honey graham crackers, 2 squares / 0.7 g  Saltine crackers, 3 squares / 0.3 g  Plain, salted pretzels, 10 pieces / 1.8 g  Whole-wheat bread, 1 slice / 1.9 g  White bread, 1 slice / 0.7 g  Raisin bread, 1 slice / 1.2 g  Plain bagel, 3 oz / 2 g  Flour tortilla, 1 oz / 0.9 g  Corn tortilla, 1 small / 1.5 g  Hamburger or hotdog bun, 1 small / 0.9 g Fruits / Dietary Fiber (g)  Apple with skin, 1 medium / 4.4 g  Sweetened applesauce,  cup / 1.5 g  Banana,  medium / 1.5 g  Grapes, 10 grapes / 0.4 g  Orange, 1 small / 2.3 g  Raisin, 1.5 oz / 1.6 g  Melon, 1 cup / 1.4 g Vegetables / Dietary Fiber (g)  Green beans (canned),  cup / 1.3 g  Carrots (cooked),  cup / 2.3 g  Broccoli (cooked),  cup / 2.8 g  Peas (cooked),  cup / 4.4 g  Mashed potatoes,  cup / 1.6 g  Lettuce, 1 cup / 0.5 g  Corn (canned),  cup / 1.6 g  Tomato,  cup / 1.1 g Document Released: 09/21/2005 Document Revised: 03/22/2012 Document Reviewed: 12/24/2011 [facility] Patient Information 2014 [place], [place].

## 2013-10-11 NOTE — Op Note (Signed)
COLONOSCOPY PROCEDURE REPORT  PATIENT:  Kelsey Sandoval  MR#:  [id number] Birthdate:  [birth year], 80 y.o., female Endoscopist:  Dr. Rogene Houston, MD Referred By:  Dr. Sallee Lange, MD Procedure Date: 10/11/2013  Procedure:   Colonoscopy  Indications: Patient is 80 year old Caucasian female with history of iron deficiency anemia. 3 out of 3 Hemoccults were negative. There is no history of melena or rectal bleeding. Recent iron studies are normal except low normal serum ferritin. Family history significant for colon carcinoma in one brother with surgery at age 68 and is doing well.. Patient's last colonoscopy was in September 2005.  Informed Consent:  The procedure and risks were reviewed with the patient and informed consent was obtained.  Medications:  Demerol  50mg  IV Versed 15 mg IV Promethazine 12.5 mg IV in diluted form.   Description of procedure:  After a digital rectal exam was performed, that colonoscope was advanced from the anus through the rectum and colon to the area of the cecum, ileocecal valve and appendiceal orifice. The cecum was deeply intubated. These structures were well-seen and photographed for the record. From the level of the cecum and ileocecal valve, the scope was slowly and cautiously withdrawn. The mucosal surfaces were carefully surveyed utilizing scope tip to flexion to facilitate fold flattening as needed. The scope was pulled down into the rectum where a thorough exam including retroflexion was performed.  Findings:   Prep excellent. Multiple diverticula at sigmoid and descending colon. Normal rectal mucosa. Single small anal papilla.    Therapeutic/Diagnostic Maneuvers Performed:   None  Complications:  None  Cecal Withdrawal Time:  6 minutes  Impression:  Examination performed to cecum. Left-sided diverticulosis.   Recommendations:  Standard instructions given. Patient will resume usual medications including Pradaxa. Patient advised to  call office if she has melena or rectal bleeding.  Alfredo Collymore U  10/11/2013 12:55 PM  CC: Dr. Sallee Lange, MD & Dr. Rayne Du ref. provider found

## 2013-10-11 NOTE — H&P (Addendum)
Kelsey Sandoval is an 80 y.o. female.   Chief Complaint: Patient is here for colonoscopy. HPI: Patient is 80 year old Caucasian female who is here for diagnostic colonoscopy. She was recently found to have mild anemia by Dr. Sallee Sandoval felt to be secondary to iron deficiency. She had 3 Hemoccults and they're all negative. She denies melena rectal bleeding or change in her bowel habits. Last colonoscopy was in September 2005. Family history significant for colon carcinoma in her brother who had surgery at age 89. He apparently had colonic polyp would not allow physicians to remove it. Patient has been Kelsey Sandoval for two days.  Past Medical History  Diagnosis Date  . Hypertension   . Dysrhythmia   . Anxiety   . Depression   . Pacemaker   . Arthritis   . Nephrotic syndrome   . Hyperlipidemia   . Pre-diabetes   . Osteopenia   . Atrial fibrillation   . Back pain, chronic     Past Surgical History  Procedure Laterality Date  . Insert / replace / remove pacemaker    . Appendectomy    . Abdominal hysterectomy      partial-pt has no ovaries  . Back surgery    . Colonoscopy  9/05  . Dual chamber pacemaker      2012    Family History  Problem Relation Age of Onset  . Colon cancer Brother    Social History:  reports that she has never smoked. She does not have any smokeless tobacco history on file. She reports that she does not drink alcohol or use illicit drugs.  Allergies:  Allergies  Allergen Reactions  . Levaquin [Levofloxacin In D5w]     nausea  . Penicillins   . Sulfa Antibiotics   . Zithromax [Azithromycin]     Nausea and rash    Medications Prior to Admission  Medication Sig Dispense Refill  . ALPRAZolam (XANAX) 0.5 MG tablet Take 0.5 mg by mouth 2 (two) times daily as needed for anxiety.      Marland Kitchen amitriptyline (ELAVIL) 50 MG tablet Take 50 mg by mouth at bedtime.      Marland Kitchen amLODipine (NORVASC) 10 MG tablet Take 10 mg by mouth daily.      . citalopram (CELEXA) 20 MG  tablet Take 20 mg by mouth daily.      . dabigatran (Kelsey Sandoval) 150 MG CAPS capsule Take 150 mg by mouth 2 (two) times daily.      . flecainide (TAMBOCOR) 50 MG tablet Take 1 tablet (50 mg total) by mouth 2 (two) times daily.  180 tablet  3  . lisinopril (PRINIVIL,ZESTRIL) 40 MG tablet Take 1 tablet (40 mg total) by mouth daily.  30 tablet  8  . metoprolol succinate (TOPROL-XL) 50 MG 24 hr tablet Take 1 tablet (50 mg total) by mouth daily. Take with or immediately following a meal.  30 tablet  8  . peg 3350 powder (MOVIPREP) 100 G SOLR Take 1 kit (200 g total) by mouth once.  1 kit  0  . promethazine (PHENERGAN) 25 MG suppository Place 1 suppository (25 mg total) rectally 3 (three) times daily as needed for nausea.  8 suppository  1  . HYDROcodone-acetaminophen (NORCO/VICODIN) 5-325 MG per tablet Take 1 tablet by mouth every 8 (eight) hours as needed for pain.  90 tablet  0  . vitamin B-12 (CYANOCOBALAMIN) 1000 MCG tablet Take 1,000 mcg by mouth 2 (two) times daily.  No results found for this or any previous visit (from the past 48 hour(s)). No results found.  ROS  Blood pressure 187/97, pulse 90, temperature 98.4 F (36.9 C), temperature source Oral, resp. rate 22, height 5' 9"  (1.753 m), weight 165 lb (74.844 kg), SpO2 95.00%. Physical Exam  Constitutional: She appears well-developed and well-nourished.  HENT:  Mouth/Throat: Oropharynx is clear and moist.  Eyes: Conjunctivae are normal. No scleral icterus.  Neck: No thyromegaly present.  Cardiovascular: Normal rate, regular rhythm and normal heart sounds.   No murmur heard. Respiratory: Effort normal and breath sounds normal.  GI: Soft. She exhibits no distension and no mass. There is no tenderness.  Musculoskeletal: She exhibits no edema.  Lymphadenopathy:    She has no cervical adenopathy.  Neurological: She is alert.  Skin: Skin is warm and dry.     Assessment/Plan History of iron deficiency anemia. Heme-negative  stools. Family history of colon carcinoma. Diagnostic colonoscopy.  Kelsey Sandoval U 10/11/2013, 12:05 PM

## 2013-10-12 ENCOUNTER — Ambulatory Visit: Payer: PRIVATE HEALTH INSURANCE | Admitting: Family Medicine

## 2013-10-13 ENCOUNTER — Encounter (HOSPITAL_COMMUNITY): Payer: Self-pay | Admitting: Internal Medicine

## 2013-10-30 ENCOUNTER — Other Ambulatory Visit: Payer: Self-pay | Admitting: Family Medicine

## 2013-11-01 ENCOUNTER — Ambulatory Visit (INDEPENDENT_AMBULATORY_CARE_PROVIDER_SITE_OTHER): Payer: Medicare Other | Admitting: Family Medicine

## 2013-11-01 ENCOUNTER — Encounter: Payer: Self-pay | Admitting: Family Medicine

## 2013-11-01 VITALS — BP 130/84 | Temp 98.9°F | Ht 70.0 in | Wt 167.0 lb

## 2013-11-01 DIAGNOSIS — J45909 Unspecified asthma, uncomplicated: Secondary | ICD-10-CM

## 2013-11-01 DIAGNOSIS — J209 Acute bronchitis, unspecified: Secondary | ICD-10-CM | POA: Diagnosis not present

## 2013-11-01 MED ORDER — METHYLPREDNISOLONE ACETATE 40 MG/ML IJ SUSP
40.0000 mg | Freq: Once | INTRAMUSCULAR | Status: AC
  Administered 2013-11-01: 40 mg via INTRAMUSCULAR

## 2013-11-01 MED ORDER — PREDNISONE 20 MG PO TABS: ORAL_TABLET | ORAL | Status: AC

## 2013-11-01 MED ORDER — BENZONATATE 100 MG PO CAPS: 100.0000 mg | ORAL_CAPSULE | Freq: Four times a day (QID) | ORAL | Status: DC | PRN

## 2013-11-01 MED ORDER — DOXYCYCLINE HYCLATE 100 MG PO CAPS: 100.0000 mg | ORAL_CAPSULE | Freq: Two times a day (BID) | ORAL | Status: DC

## 2013-11-01 MED ORDER — HYDROCODONE-ACETAMINOPHEN 5-325 MG PO TABS: 1.0000 | ORAL_TABLET | Freq: Three times a day (TID) | ORAL | Status: DC | PRN

## 2013-11-01 NOTE — Progress Notes (Signed)
   Subjective:    Patient ID: Kelsey Sandoval, female    DOB: 12/19/1932, 80 y.o.   MRN: [id number]  Cough This is a new problem. The current episode started in the past 7 days. The cough is productive of sputum. Associated symptoms include ear congestion, headaches, nasal congestion, shortness of breath and wheezing. Associated symptoms comments: Dizziness, sinus pressure. Treatments tried: Inhaler and Mucinex. The treatment provided mild relief. Her past medical history is significant for bronchitis.   Started Sunday, increased cough, then wheeze, no sweats, diizzy with [place]   Review of Systems  Respiratory: Positive for cough, shortness of breath and wheezing.   Neurological: Positive for headaches.       Objective:   Physical Exam  Nursing note and vitals reviewed. Constitutional: She appears well-developed.  HENT:  Head: Normocephalic.  Nose: Nose normal.  Mouth/Throat: Oropharynx is clear and moist. No oropharyngeal exudate.  Neck: Neck supple.  Cardiovascular: Normal rate and normal heart sounds.   No murmur heard. Pulmonary/Chest: Effort normal and breath sounds normal. She has no wheezes.  Lymphadenopathy:    She has no cervical adenopathy.  Skin: Skin is warm and dry.   Deep cough noted some wheezing noted as well       Assessment & Plan:  Viral syndrome with secondary bronchitis flareup of reactive airway prednisone taper, Depo-Medrol shot, antibiotics prescribed, warning signs discussed. Call us if ongoing troubles

## 2013-11-01 NOTE — Patient Instructions (Signed)
Use albuterol every few hours to help with your breathing  Take the medicxations with a snack and water  If worse call us

## 2013-11-07 ENCOUNTER — Ambulatory Visit: Payer: PRIVATE HEALTH INSURANCE | Admitting: Family Medicine

## 2013-11-08 ENCOUNTER — Other Ambulatory Visit: Payer: Self-pay | Admitting: Family Medicine

## 2013-12-05 ENCOUNTER — Other Ambulatory Visit: Payer: Self-pay | Admitting: Family Medicine

## 2013-12-08 ENCOUNTER — Encounter: Payer: Self-pay | Admitting: Family Medicine

## 2013-12-08 ENCOUNTER — Ambulatory Visit (INDEPENDENT_AMBULATORY_CARE_PROVIDER_SITE_OTHER): Payer: Medicare Other | Admitting: Family Medicine

## 2013-12-08 VITALS — BP 112/78 | Ht 70.0 in | Wt 170.0 lb

## 2013-12-08 DIAGNOSIS — R5383 Other fatigue: Secondary | ICD-10-CM | POA: Diagnosis not present

## 2013-12-08 DIAGNOSIS — R233 Spontaneous ecchymoses: Secondary | ICD-10-CM

## 2013-12-08 DIAGNOSIS — I10 Essential (primary) hypertension: Secondary | ICD-10-CM

## 2013-12-08 DIAGNOSIS — D509 Iron deficiency anemia, unspecified: Secondary | ICD-10-CM | POA: Diagnosis not present

## 2013-12-08 DIAGNOSIS — M549 Dorsalgia, unspecified: Secondary | ICD-10-CM | POA: Diagnosis not present

## 2013-12-08 DIAGNOSIS — R5381 Other malaise: Secondary | ICD-10-CM

## 2013-12-08 DIAGNOSIS — G8929 Other chronic pain: Secondary | ICD-10-CM

## 2013-12-08 MED ORDER — METOPROLOL SUCCINATE ER 50 MG PO TB24: 50.0000 mg | ORAL_TABLET | Freq: Every day | ORAL | Status: DC

## 2013-12-08 MED ORDER — AMITRIPTYLINE HCL 25 MG PO TABS: 25.0000 mg | ORAL_TABLET | Freq: Every day | ORAL | Status: DC

## 2013-12-08 MED ORDER — HYDROCODONE-ACETAMINOPHEN 5-325 MG PO TABS: 1.0000 | ORAL_TABLET | Freq: Three times a day (TID) | ORAL | Status: DC | PRN

## 2013-12-08 MED ORDER — ALPRAZOLAM 0.5 MG PO TABS: 0.5000 mg | ORAL_TABLET | Freq: Two times a day (BID) | ORAL | Status: DC | PRN

## 2013-12-08 NOTE — Progress Notes (Signed)
   Subjective:    Patient ID: Kelsey Sandoval, female    DOB: 09/07/1933, 80 y.o.   MRN: [id number]  HPIHypertension check up. She relates she's trying to watch her diet try and eat on a regular basis. She denies any chest tightness pressure pain shortness of breath.  Concerns about rash on right leg. This been going on progressive over the past several months knows of no injury   She does relate significant low back pain discomfort on a regular basis she also relates bilateral knee pain. Pain medications or use to help control this. Because of her renal history anti-inflammatories not indicated.  Patient does have some stress levels the Xanax helps she does use Elavil at nighttime to help sleep but she is willing to try to come off of it.  Review of Systems Denies   negative chest pain shortness of breath nausea vomiting diarrhea. She does relate the rash on the right lower leg. Denies excessive thirst or urination does relate some stress Objective:   Physical Exam  Lungs are clear hearts regular extremities no edema skin warm dry she is purpura on the right lower leg no sign of synovitis neurologic grossly normal neck no masses      Assessment & Plan:  Ecchymosis on the right leg with this rash there is purpura. We will check lab work if this does not reveal the source then referral to dermatology  Chronic pain and discomfort 3 prescriptions given she will stick with the regimen if she has any problems she will let us know otherwise followup 3 months  HTN under good control watch diet continue current measures we will go ahead and do her chronic medicines into 90 days scripts  Patient also had fatigue tiredness history of anemia recheck CBC ferritin but also check B12 she is taking oral B12 she is worried that her B12 is low because of the fatigue and tiredness she is having  Patient will titrate off of Elavil 25 mg each bedtime for one month then stop

## 2013-12-09 LAB — CBC WITH DIFFERENTIAL/PLATELET
BASOS ABS: 0.1 10*3/uL (ref 0.0–0.1)
Basophils Relative: 1 % (ref 0–1)
EOS PCT: 2 % (ref 0–5)
Eosinophils Absolute: 0.1 10*3/uL (ref 0.0–0.7)
HCT: 32.8 % — ABNORMAL LOW (ref 36.0–46.0)
Hemoglobin: 10.9 g/dL — ABNORMAL LOW (ref 12.0–15.0)
LYMPHS ABS: 2 10*3/uL (ref 0.7–4.0)
LYMPHS PCT: 33 % (ref 12–46)
MCH: 29.9 pg (ref 26.0–34.0)
MCHC: 33.2 g/dL (ref 30.0–36.0)
MCV: 89.9 fL (ref 78.0–100.0)
Monocytes Absolute: 0.6 10*3/uL (ref 0.1–1.0)
Monocytes Relative: 10 % (ref 3–12)
NEUTROS ABS: 3.3 10*3/uL (ref 1.7–7.7)
Neutrophils Relative %: 54 % (ref 43–77)
Platelets: 281 10*3/uL (ref 150–400)
RBC: 3.65 MIL/uL — AB (ref 3.87–5.11)
RDW: 14.6 % (ref 11.5–15.5)
WBC: 6.2 10*3/uL (ref 4.0–10.5)

## 2013-12-09 LAB — IRON AND TIBC
%SAT: 30 % (ref 20–55)
Iron: 86 ug/dL (ref 42–145)
TIBC: 290 ug/dL (ref 250–470)
UIBC: 204 ug/dL (ref 125–400)

## 2013-12-09 LAB — VITAMIN B12: VITAMIN B 12: 1081 pg/mL — AB (ref 211–911)

## 2013-12-09 LAB — SEDIMENTATION RATE: Sed Rate: 27 mm/hr — ABNORMAL HIGH (ref 0–22)

## 2013-12-09 LAB — FERRITIN: Ferritin: 39 ng/mL (ref 10–291)

## 2013-12-20 NOTE — Progress Notes (Signed)
Patient had work up with Dr Laural Golden including Diagnostic Colonoscopy in January 2015 for Iron Deficiency anemia.

## 2013-12-28 ENCOUNTER — Ambulatory Visit: Payer: Medicare Other | Admitting: Nurse Practitioner

## 2013-12-29 ENCOUNTER — Other Ambulatory Visit: Payer: Self-pay | Admitting: Family Medicine

## 2013-12-29 DIAGNOSIS — D509 Iron deficiency anemia, unspecified: Secondary | ICD-10-CM

## 2014-01-01 NOTE — Progress Notes (Signed)
Patient notified and verbalized understanding. 

## 2014-01-03 ENCOUNTER — Encounter (INDEPENDENT_AMBULATORY_CARE_PROVIDER_SITE_OTHER): Payer: Self-pay | Admitting: *Deleted

## 2014-01-10 ENCOUNTER — Encounter (INDEPENDENT_AMBULATORY_CARE_PROVIDER_SITE_OTHER): Payer: Self-pay | Admitting: Internal Medicine

## 2014-01-10 ENCOUNTER — Ambulatory Visit (INDEPENDENT_AMBULATORY_CARE_PROVIDER_SITE_OTHER): Payer: Medicare Other | Admitting: Internal Medicine

## 2014-01-10 ENCOUNTER — Encounter (HOSPITAL_COMMUNITY): Payer: Self-pay | Admitting: Pharmacy Technician

## 2014-01-10 ENCOUNTER — Telehealth: Payer: Self-pay | Admitting: Cardiovascular Disease

## 2014-01-10 ENCOUNTER — Other Ambulatory Visit (INDEPENDENT_AMBULATORY_CARE_PROVIDER_SITE_OTHER): Payer: Self-pay | Admitting: *Deleted

## 2014-01-10 VITALS — BP 112/52 | HR 76 | Temp 98.0°F | Ht 69.0 in | Wt 168.4 lb

## 2014-01-10 DIAGNOSIS — D649 Anemia, unspecified: Secondary | ICD-10-CM | POA: Diagnosis not present

## 2014-01-10 NOTE — Telephone Encounter (Signed)
Sent to my in basket to await response tomorrow.

## 2014-01-10 NOTE — Telephone Encounter (Signed)
Discussed w/Kristin.  She is on Pradaxa for AF.  Will have patient do a transmission tonight and have [place] review in AM.  Asked Shakila to give info to Day for answer on Pradaxa. Patient notified to do a transmission tonight and we will call tomorrow.  Voiced understanding

## 2014-01-10 NOTE — Progress Notes (Signed)
Subjective:     Patient ID: Kelsey Sandoval, female   DOB: 01/16/1933, 80 y.o.   MRN: [id number]  HPI Here today at request of Sallee Lange. She has had a drop in her hemoglobin to 10.9. MCV however is normal.  07/12/2013 Hemoglobin 11.4. Stool cards have been negative for blood,. She underwent a colonoscopy this year which did not reveal source of bleeding.  Her appetite is good. There has been no weight. No dysphagia. No acid reflux. No abdominal pan. She usually has a BM once a day. No melena or bright red rectal bleeding. Underwent a colonoscopy in January for anemia . Hx of atrial  Fib and pacemaker and is maintained on Pradaxa.   10/11/2013 Colonoscopy: anemia Dr. Laural Golden Prep excellent.  Multiple diverticula at sigmoid and descending colon.  Normal rectal mucosa.  Single small anal papilla.    CBC    Component Value Date/Time   WBC 6.2 12/08/2013 0926   RBC 3.65* 12/08/2013 0926   HGB 10.9* 12/08/2013 0926   HCT 32.8* 12/08/2013 0926   PLT 281 12/08/2013 0926   MCV 89.9 12/08/2013 0926   MCH 29.9 12/08/2013 0926   MCHC 33.2 12/08/2013 0926   RDW 14.6 12/08/2013 0926   LYMPHSABS 2.0 12/08/2013 0926   MONOABS 0.6 12/08/2013 0926   EOSABS 0.1 12/08/2013 0926   BASOSABS 0.1 12/08/2013 0926      Review of Systems Past Medical History  Diagnosis Date  . Hypertension   . Dysrhythmia   . Anxiety   . Depression   . Pacemaker   . Arthritis   . Nephrotic syndrome   . Hyperlipidemia   . Pre-diabetes   . Osteopenia   . Atrial fibrillation   . Back pain, chronic     Past Surgical History  Procedure Laterality Date  . Insert / replace / remove pacemaker    . Appendectomy    . Abdominal hysterectomy      partial-pt has no ovaries  . Back surgery    . Colonoscopy  9/05  . Dual chamber pacemaker      2012  . Colonoscopy N/A 10/11/2013    Procedure: COLONOSCOPY;  Surgeon: Rogene Houston, MD;  Location: [facility];  Service: Endoscopy;  Laterality: N/A;  1200    Allergies  Allergen  Reactions  . Levaquin [Levofloxacin In D5w]     nausea  . Penicillins   . Sulfa Antibiotics   . Zithromax [Azithromycin]     Nausea and rash    Current Outpatient Prescriptions on File Prior to Visit  Medication Sig Dispense Refill  . ALPRAZolam (XANAX) 0.5 MG tablet Take 1 tablet (0.5 mg total) by mouth 2 (two) times daily as needed for anxiety.  60 tablet  4  . amitriptyline (ELAVIL) 25 MG tablet Take 1 tablet (25 mg total) by mouth at bedtime.  30 tablet  0  . amLODipine (NORVASC) 10 MG tablet TAKE ONE TABLET BY MOUTH ONCE DAILY  30 tablet  5  . citalopram (CELEXA) 20 MG tablet TAKE ONE TABLET BY MOUTH ONCE DAILY  30 tablet  3  . dabigatran (PRADAXA) 150 MG CAPS capsule Take 150 mg by mouth 2 (two) times daily.      . flecainide (TAMBOCOR) 50 MG tablet Take 1 tablet (50 mg total) by mouth 2 (two) times daily.  180 tablet  3  . HYDROcodone-acetaminophen (NORCO/VICODIN) 5-325 MG per tablet Take 1 tablet by mouth every 8 (eight) hours as needed.  90 tablet  0  . lisinopril (PRINIVIL,ZESTRIL) 40 MG tablet Take 1 tablet (40 mg total) by mouth daily.  30 tablet  8  . metoprolol succinate (TOPROL-XL) 50 MG 24 hr tablet Take 1 tablet (50 mg total) by mouth daily. Take with or immediately following a meal.  30 tablet  8  . pravastatin (PRAVACHOL) 80 MG tablet Take 80 mg by mouth daily.      . vitamin B-12 (CYANOCOBALAMIN) 1000 MCG tablet Take 1,000 mcg by mouth 2 (two) times daily.       No current facility-administered medications on file prior to visit.   Retired from [employer]. Two children. Divorced.     Objective:   Physical Exam  Filed Vitals:   01/10/14 1437  BP: 112/52  Pulse: 76  Temp: 98 F (36.7 C)  Height: 5\' 9"  (1.753 m)  Weight: 168 lb 6.4 oz (76.386 kg)  Alert and oriented. Skin warm and dry. Oral mucosa is moist.   . Sclera anicteric, conjunctivae is pink. Thyroid not enlarged. No cervical lymphadenopathy. Lungs clear. Heart regular rate and rhythm.  Abdomen is soft.  Bowel sounds are positive. No hepatomegaly. No abdominal masses felt. No tenderness.  No edema to lower extremities.  Rectal exam deferred.       Assessment:    Anemia. She has had a   drop in her hemoglobin. Recent colonoscopy was normal. Celiac disease needs to be ruled out.  I discussed this case with Dr. Laural Golden.     Plan:     EGD with duodenal biopsy.

## 2014-01-10 NOTE — Telephone Encounter (Signed)
Need to have Endoscopy on 01-17-14. She is on Pradaxa,need to stop this 2 days before,wants to know if this will be alrightt?

## 2014-01-10 NOTE — Patient Instructions (Signed)
EGD with duodenal biopsy.

## 2014-01-11 NOTE — Telephone Encounter (Signed)
Per Tobin Chad she did not receive a download from last PM.  I call the patient and she thought she sent a transmission - took the wand off after the machine lit up.  She will do it again this AM and wait to see if she took the wand off too soon last night.  Message sent to [facility] to keep a look out for this and if she doesn't receive please call the patient and see if she can work her through the process. Asked that she keep me updated.  If they are unable patient told she would need to come in for a device interrogation so that a decision can be made about the Pradaxa.  Voiced understanding.

## 2014-01-11 NOTE — Telephone Encounter (Signed)
Download received this AM.  Per Kristin/Shakila she had about 25 short bursts of A.Fib less than 1 minute.  Erasmo Downer will review w/Dr Ellyn Hack in AM concerning stopping Pradaxa prior to procedure.

## 2014-01-11 NOTE — Telephone Encounter (Signed)
Per Tobin Chad no Atrial Fibrillation on the download this AM.  Will discuss with Dr. Ellyn Hack in AM.

## 2014-01-12 ENCOUNTER — Telehealth: Payer: Self-pay | Admitting: *Deleted

## 2014-01-12 NOTE — Telephone Encounter (Signed)
Letter sent to Dr. Laural Golden OK to hold Pradaxa for 2 days prior to endoscopic procedure.  Patient notified to hold Monday and Tuesday and restart as directed. Patient voiced understanding.

## 2014-01-17 ENCOUNTER — Ambulatory Visit (HOSPITAL_COMMUNITY)
Admission: RE | Admit: 2014-01-17 | Discharge: 2014-01-17 | Disposition: A | Discharge status: Home / Self Care | Payer: Medicare Other | Source: Ambulatory Visit | Attending: Internal Medicine | Admitting: Internal Medicine

## 2014-01-17 ENCOUNTER — Encounter (HOSPITAL_COMMUNITY): Payer: Self-pay

## 2014-01-17 ENCOUNTER — Encounter (HOSPITAL_COMMUNITY)
Admission: RE | Disposition: A | Discharge status: Home / Self Care | Payer: Self-pay | Source: Ambulatory Visit | Attending: Internal Medicine

## 2014-01-17 DIAGNOSIS — K922 Gastrointestinal hemorrhage, unspecified: Secondary | ICD-10-CM

## 2014-01-17 DIAGNOSIS — D649 Anemia, unspecified: Secondary | ICD-10-CM | POA: Diagnosis not present

## 2014-01-17 DIAGNOSIS — K319 Disease of stomach and duodenum, unspecified: Secondary | ICD-10-CM | POA: Diagnosis not present

## 2014-01-17 DIAGNOSIS — I10 Essential (primary) hypertension: Secondary | ICD-10-CM | POA: Insufficient documentation

## 2014-01-17 DIAGNOSIS — F411 Generalized anxiety disorder: Secondary | ICD-10-CM | POA: Insufficient documentation

## 2014-01-17 DIAGNOSIS — K449 Diaphragmatic hernia without obstruction or gangrene: Secondary | ICD-10-CM

## 2014-01-17 DIAGNOSIS — D509 Iron deficiency anemia, unspecified: Secondary | ICD-10-CM | POA: Diagnosis not present

## 2014-01-17 DIAGNOSIS — K2960 Other gastritis without bleeding: Secondary | ICD-10-CM

## 2014-01-17 DIAGNOSIS — E785 Hyperlipidemia, unspecified: Secondary | ICD-10-CM | POA: Insufficient documentation

## 2014-01-17 DIAGNOSIS — Z79899 Other long term (current) drug therapy: Secondary | ICD-10-CM | POA: Diagnosis not present

## 2014-01-17 HISTORY — PX: BIOPSY: SHX5522

## 2014-01-17 HISTORY — PX: ESOPHAGOGASTRODUODENOSCOPY: SHX5428

## 2014-01-17 SURGERY — EGD (ESOPHAGOGASTRODUODENOSCOPY)
Anesthesia: Moderate Sedation

## 2014-01-17 MED ORDER — MIDAZOLAM HCL 5 MG/5ML IJ SOLN
INTRAMUSCULAR | Status: DC | PRN
  Administered 2014-01-17: 2 mg via INTRAVENOUS
  Administered 2014-01-17 (×2): 3 mg via INTRAVENOUS
  Administered 2014-01-17: 2 mg via INTRAVENOUS

## 2014-01-17 MED ORDER — MEPERIDINE HCL 50 MG/ML IJ SOLN
INTRAMUSCULAR | Status: AC
  Filled 2014-01-17: qty 1

## 2014-01-17 MED ORDER — MIDAZOLAM HCL 5 MG/5ML IJ SOLN
INTRAMUSCULAR | Status: DC
Start: 2014-01-17 — End: 2014-01-17
  Filled 2014-01-17: qty 10

## 2014-01-17 MED ORDER — STERILE WATER FOR IRRIGATION IR SOLN
Status: DC | PRN
  Administered 2014-01-17: 14:00:00

## 2014-01-17 MED ORDER — MEPERIDINE HCL 50 MG/ML IJ SOLN
INTRAMUSCULAR | Status: DC | PRN
  Administered 2014-01-17 (×3): 25 mg via INTRAVENOUS

## 2014-01-17 MED ORDER — SODIUM CHLORIDE 0.9 % IV SOLN
INTRAVENOUS | Status: DC
  Administered 2014-01-17: 12:00:00 via INTRAVENOUS

## 2014-01-17 NOTE — Op Note (Signed)
EGD PROCEDURE REPORT  PATIENT:  Kelsey Sandoval  MR#:  [id number] Birthdate:  [birth year], 80 y.o., female Endoscopist:  Dr. Rogene Houston, MD Referred By:  Dr. Sallee Lange, MD Procedure Date: 01/17/2014  Procedure:   EGD  Indications:  Patient is 80 year old Caucasian female who has history of iron deficiency anemia and recently noted to have dropped hemoglobin by more than 1 g. Iron studies are normal with serum iron of 86 TIBC of 219 and saturation of 30 with serum ferritin is low normal at 39.            Informed Consent:  The risks, benefits, alternatives & imponderables which include, but are not limited to, bleeding, infection, perforation, drug reaction and potential missed lesion have been reviewed.  The potential for biopsy, lesion removal, esophageal dilation, etc. have also been discussed.  Questions have been answered.  All parties agreeable.  Please see history & physical in medical record for more information.  Medications:  Demerol 75 mg IV Versed 10 mg IV Cetacaine spray topically for oropharyngeal anesthesia  Description of procedure:  The endoscope was introduced through the mouth and advanced to the second portion of the duodenum without difficulty or limitations. The mucosal surfaces were surveyed very carefully during advancement of the scope and upon withdrawal.  Findings:  Esophagus:  Mucosa of the esophagus was normal. GE junction was unremarkable. GEJ:  37 cm Hiatus:  39 cm Stomach:  Stomach was empty and distended very well with insufflation. Folds in the proximal stomach were normal. Examination of mucosa at gastric body was normal. In the antrum and prepyloric region there were  few patches of mucosa typical of intestinal metaplasia. Focal erythema was also noted but no erosions or ulcers present. Pyloric channel was patent. -- Fundus and cardia were unremarkable. Duodenum:  Normal bulbar and post bulbar mucosa.  Therapeutic/Diagnostic Maneuvers Performed:   Random biopsies are taken from post bulbar mucosa for routine histology.  Complications:  None  Impression: Small sliding hiatal hernia without evidence of erosive esophagitis. Nonerosive antral gastritis but no evidence of peptic ulcer disease. Patchy changes of intestinal metaplasia at antrum and pyloric channel. Duodenal biopsy taken to rule out mucosal disease.   Recommendations:  Patient will resume usual medications including Pradaxa. H. pylori serology. I would be contacting patient with results of biopsy and blood test.  Mechele Dawley Rehman  01/17/2014  2:03 PM  CC: Dr. Sallee Lange, MD & Dr. Rayne Du ref. provider found

## 2014-01-17 NOTE — H&P (Signed)
Kelsey Sandoval is an 80 y.o. female.   Chief Complaint: Patient is here for EGD. HPI: Patient is 80 year old Caucasian female who has history of anemia felt to be secondary to deficiency however Hemoccults x3 were negative. She underwent colonoscopy in January this year revealing left-sided diverticulosis. Patient was noted to have dropped her hemoglobin by more than 1 g. There is no history of hematemesis melena or rectal bleeding. There is no history of hematuria or vaginal bleeding. Since patient is on Pradaxa and Dr.Luking is concerned that she may be losing blood intermittently. She is therefore here for EGD. She has intermittent nausea which is quickly relieved with meals. She denies abdominal pain anorexia or weight loss. She does not take OTC NSAIDs.  Past Medical History  Diagnosis Date  . Hypertension   . Dysrhythmia   . Anxiety   . Depression   . Pacemaker   . Arthritis   . Nephrotic syndrome   . Hyperlipidemia   . Pre-diabetes   . Osteopenia   . Atrial fibrillation   . Back pain, chronic     Past Surgical History  Procedure Laterality Date  . Insert / replace / remove pacemaker    . Appendectomy    . Abdominal hysterectomy      partial-pt has no ovaries  . Back surgery    . Colonoscopy  9/05  . Dual chamber pacemaker      2012  . Colonoscopy N/A 10/11/2013    Procedure: COLONOSCOPY;  Surgeon: Rogene Houston, MD;  Location: [facility];  Service: Endoscopy;  Laterality: N/A;  1200    Family History  Problem Relation Age of Onset  . Colon cancer Brother    Social History:  reports that she has never smoked. She does not have any smokeless tobacco history on file. She reports that she does not drink alcohol or use illicit drugs.  Allergies:  Allergies  Allergen Reactions  . Levaquin [Levofloxacin] Nausea Only  . Penicillins Other (See Comments)    Caused patient to pass out.  . Sulfa Antibiotics Other (See Comments)    Unknown  . Zithromax [Azithromycin]      Nausea and rash    Medications Prior to Admission  Medication Sig Dispense Refill  . albuterol (PROVENTIL HFA;VENTOLIN HFA) 108 (90 BASE) MCG/ACT inhaler Inhale 2 puffs into the lungs every 6 (six) hours as needed for wheezing or shortness of breath.      . ALPRAZolam (XANAX) 0.5 MG tablet Take 1 tablet (0.5 mg total) by mouth 2 (two) times daily as needed for anxiety.  60 tablet  4  . amitriptyline (ELAVIL) 25 MG tablet Take 1 tablet (25 mg total) by mouth at bedtime.  30 tablet  0  . amLODipine (NORVASC) 10 MG tablet Take 10 mg by mouth daily.      . citalopram (CELEXA) 20 MG tablet Take 20 mg by mouth daily.      Marland Kitchen CRANBERRY PO Take 100 mg by mouth daily.      . dabigatran (PRADAXA) 150 MG CAPS capsule Take 150 mg by mouth 2 (two) times daily.      . flecainide (TAMBOCOR) 50 MG tablet Take 1 tablet (50 mg total) by mouth 2 (two) times daily.  180 tablet  3  . HYDROcodone-acetaminophen (NORCO/VICODIN) 5-325 MG per tablet Take 1 tablet by mouth every 8 (eight) hours as needed.  90 tablet  0  . lisinopril (PRINIVIL,ZESTRIL) 40 MG tablet Take 1 tablet (40 mg total)  by mouth daily.  30 tablet  8  . metoprolol succinate (TOPROL-XL) 50 MG 24 hr tablet Take 1 tablet (50 mg total) by mouth daily. Take with or immediately following a meal.  30 tablet  8  . pravastatin (PRAVACHOL) 80 MG tablet Take 80 mg by mouth daily.      . vitamin B-12 (CYANOCOBALAMIN) 1000 MCG tablet Take 1,000 mcg by mouth 2 (two) times daily.        No results found for this or any previous visit (from the past 48 hour(s)). No results found.  ROS  Blood pressure 183/98, pulse 89, temperature 97.9 F (36.6 C), temperature source Oral, resp. rate 20, SpO2 99.00%. Physical Exam  Constitutional: She appears well-developed and well-nourished.  HENT:  Mouth/Throat: Oropharynx is clear and moist.  Eyes: Conjunctivae are normal. No scleral icterus.  Neck: No thyromegaly present.  Cardiovascular:  Occasional irregularity.  Normal S1 and S2. No murmur or gallop noted  Respiratory: Effort normal.  GI: Soft. She exhibits no distension and no mass. There is no tenderness.  Musculoskeletal: She exhibits no edema.  Lymphadenopathy:    She has no cervical adenopathy.  Neurological: She is alert.  Skin: Skin is dry.     Assessment/Plan Anemia with recent drop in H&H. GI bleed suspected insetting of anti-coagulation with Pradaxa. Diagnostic EGD.  Kelsey Sandoval 01/17/2014, 1:29 PM

## 2014-01-17 NOTE — Discharge Instructions (Signed)
Resume usual medications including Pradaxa which can be started this evening. Resume usual diet. No driving for 24 hours. Physician will call with results of biopsy and blood test.  Gastrointestinal Endoscopy Care After Refer to this sheet in the next few weeks. These instructions provide you with information on caring for yourself after your procedure. Your caregiver may also give you more specific instructions. Your treatment has been planned according to current medical practices, but problems sometimes occur. Call your caregiver if you have any problems or questions after your procedure. HOME CARE INSTRUCTIONS  If you were given medicine to help you relax (sedative), do not drive, operate machinery, or sign important documents for 24 hours.  Avoid alcohol and hot or warm beverages for the first 24 hours after the procedure.  Only take over-the-counter or prescription medicines for pain, discomfort, or fever as directed by your caregiver. You may resume taking your normal medicines unless your caregiver tells you otherwise. Ask your caregiver when you may resume taking medicines that may cause bleeding, such as aspirin, clopidogrel, or warfarin.  You may return to your normal diet and activities on the day after your procedure, or as directed by your caregiver. Walking may help to reduce any bloated feeling in your abdomen.  Drink enough fluids to keep your urine clear or pale yellow.  You may gargle with salt water if you have a sore throat. SEEK IMMEDIATE MEDICAL CARE IF:  You have severe nausea or vomiting.  You have severe abdominal pain, abdominal cramps that last longer than 6 hours, or abdominal swelling (distention).  You have severe shoulder or back pain.  You have trouble swallowing.  You have shortness of breath, your breathing is shallow, or you are breathing faster than normal.  You have a fever or a rapid heartbeat.  You vomit blood or material that looks like  coffee grounds.  You have bloody, black, or tarry stools. MAKE SURE YOU:  Understand these instructions.  Will watch your condition.  Will get help right away if you are not doing well or get worse. Document Released: 05/05/2004 Document Revised: 03/22/2012 Document Reviewed: 12/22/2011 [facility] Patient Information 2014 [place], [place].

## 2014-01-18 DIAGNOSIS — D649 Anemia, unspecified: Secondary | ICD-10-CM | POA: Diagnosis not present

## 2014-01-18 LAB — H. PYLORI ANTIBODY, IGG: H Pylori IgG: <0.4 {ISR}

## 2014-01-22 ENCOUNTER — Encounter (HOSPITAL_COMMUNITY): Payer: Self-pay | Admitting: Internal Medicine

## 2014-01-24 ENCOUNTER — Telehealth: Payer: Self-pay | Admitting: *Deleted

## 2014-01-24 DIAGNOSIS — D649 Anemia, unspecified: Secondary | ICD-10-CM

## 2014-01-24 NOTE — Telephone Encounter (Signed)
Dr. Nicki Reaper spoke with Dr. Laural Golden and he recommended hg/hct in one month and a follow up with Dr. Nicki Reaper in one month after bloodwork. Discussed with pt. bloodwork order put in to do in one month and pt transferred to front to schedule office visit with Dr. Nicki Reaper in one month.

## 2014-02-07 DIAGNOSIS — D649 Anemia, unspecified: Secondary | ICD-10-CM | POA: Diagnosis not present

## 2014-02-07 LAB — HEMOGLOBIN: Hemoglobin: 11.2 g/dL — ABNORMAL LOW (ref 12.0–15.0)

## 2014-02-07 LAB — HEMATOCRIT: HEMATOCRIT: 32.8 % — AB (ref 36.0–46.0)

## 2014-02-08 ENCOUNTER — Encounter: Payer: Self-pay | Admitting: Family Medicine

## 2014-02-23 ENCOUNTER — Ambulatory Visit (INDEPENDENT_AMBULATORY_CARE_PROVIDER_SITE_OTHER): Payer: Medicare Other | Admitting: Family Medicine

## 2014-02-23 ENCOUNTER — Encounter: Payer: Self-pay | Admitting: Family Medicine

## 2014-02-23 VITALS — BP 138/78 | Ht 70.0 in | Wt 168.0 lb

## 2014-02-23 DIAGNOSIS — R809 Proteinuria, unspecified: Secondary | ICD-10-CM | POA: Diagnosis not present

## 2014-02-23 DIAGNOSIS — R600 Localized edema: Secondary | ICD-10-CM

## 2014-02-23 DIAGNOSIS — I10 Essential (primary) hypertension: Secondary | ICD-10-CM | POA: Diagnosis not present

## 2014-02-23 DIAGNOSIS — R609 Edema, unspecified: Secondary | ICD-10-CM

## 2014-02-23 DIAGNOSIS — Z79899 Other long term (current) drug therapy: Secondary | ICD-10-CM

## 2014-02-23 DIAGNOSIS — R110 Nausea: Secondary | ICD-10-CM

## 2014-02-23 DIAGNOSIS — D509 Iron deficiency anemia, unspecified: Secondary | ICD-10-CM

## 2014-02-23 MED ORDER — HYDROCODONE-ACETAMINOPHEN 5-325 MG PO TABS: 1.0000 | ORAL_TABLET | Freq: Three times a day (TID) | ORAL | Status: DC | PRN

## 2014-02-23 MED ORDER — PRAVASTATIN SODIUM 80 MG PO TABS: 80.0000 mg | ORAL_TABLET | Freq: Every day | ORAL | Status: DC

## 2014-02-23 MED ORDER — CITALOPRAM HYDROBROMIDE 20 MG PO TABS: 20.0000 mg | ORAL_TABLET | Freq: Every day | ORAL | Status: DC

## 2014-02-23 NOTE — Patient Instructions (Signed)
Iron tablets, one daily with food  Do your labs  May need diuretic

## 2014-02-23 NOTE — Progress Notes (Signed)
Subjective:    Patient ID: Kelsey Sandoval, female    DOB: 11/17/1932, 80 y.o.   MRN: [id number]  HPI Patient is here today for a f/u to go over BW results d/t anemia. The gastroenterologists done numerous testing on her and did not feel further testing was necessary recommended a followup hemoglobin and hematocrit.  She also c/o swelling in her legs. It started a month ago. She does relate that the swelling started about a month ago and she notices been progressive. It is worse when she stands on it for a significant length of time she denies excessive thirst urination denies PND denies orthopnea denies chest pressure tightness she does get tired when she does try to stay physically active. She states she does try to lay down at night and the swelling does go down to some degree at nighttime.  Pt also states that sometimes she wakes up feeling nauseous. She denies any right upper quadrant pain epigastric pain reflux or vomiting. Denies bloody stools or frequent loose stools. She states it does not seem to vary with eating. No pain associated with it.  She does have a history of proteinuria.   Review of Systems  Constitutional: Negative for activity change, appetite change and fatigue.  HENT: Negative for congestion and sinus pressure.   Respiratory: Negative for cough and shortness of breath.   Cardiovascular: Positive for leg swelling. Negative for chest pain.  Gastrointestinal: Negative for abdominal pain.  Endocrine: Negative for polydipsia and polyphagia.  Genitourinary: Negative for frequency.  Neurological: Negative for weakness.  Psychiatric/Behavioral: Negative for confusion.       Objective:   Physical Exam  Vitals reviewed. Constitutional: She appears well-nourished. No distress.  Cardiovascular: Normal rate, regular rhythm and normal heart sounds.   No murmur heard. Pulmonary/Chest: Effort normal and breath sounds normal. No respiratory distress.  Abdominal: Soft. She  exhibits no distension.  Musculoskeletal: She exhibits no edema.  Lymphadenopathy:    She has no cervical adenopathy.  Neurological: She is alert. She exhibits normal muscle tone.  Psychiatric: Her behavior is normal.          Assessment & Plan:  1. Anemia, iron deficiency This patient has what appears to be iron deficient anemia. I encourage her to take one tablet daily iron 325 mg. She started been thoroughly worked up by Dr. Laural Golden. We will recheck hemoglobin hematocrit in 3-4 months time. - Brain natriuretic peptide  2. Pedal edema The pedal edema that she is dealing with could be related to congestive heart failure but her lungs are clear she is not having any DOB we will check BNP I also recommend that this could be related to amlodipine. It is possible we may have to put her on a low-dose diuretic. - Brain natriuretic peptide  3. Proteinuria She has a history of proteinuria we will check urine protein. - Brain natriuretic peptide - Urine Microalbumin w/creat. ratio  4. Systemic hypertension Blood pressure under good control check metabolic 7 - Basic metabolic panel  5. Nausea alone This nausea has gone away I really doubt that is a sign of any problems if it returns consider evaluation for the possibility of gallbladder disease  6. Encounter for long-term (current) use of other medications Check liver functions look at albumin in regards to her pedal edema - Hepatic function panel   patient may end up needing to have a 24-hour urine protein and 24-hour urine creatinine await the results of these above tests first significant  time spent with patient [phone]

## 2014-02-27 ENCOUNTER — Encounter: Payer: Self-pay | Admitting: *Deleted

## 2014-02-27 DIAGNOSIS — R609 Edema, unspecified: Secondary | ICD-10-CM | POA: Diagnosis not present

## 2014-02-27 DIAGNOSIS — Z79899 Other long term (current) drug therapy: Secondary | ICD-10-CM | POA: Diagnosis not present

## 2014-02-27 DIAGNOSIS — R0609 Other forms of dyspnea: Secondary | ICD-10-CM | POA: Diagnosis not present

## 2014-02-27 DIAGNOSIS — I10 Essential (primary) hypertension: Secondary | ICD-10-CM | POA: Diagnosis not present

## 2014-02-27 DIAGNOSIS — R809 Proteinuria, unspecified: Secondary | ICD-10-CM | POA: Diagnosis not present

## 2014-02-27 DIAGNOSIS — D509 Iron deficiency anemia, unspecified: Secondary | ICD-10-CM | POA: Diagnosis not present

## 2014-02-27 DIAGNOSIS — R0989 Other specified symptoms and signs involving the circulatory and respiratory systems: Secondary | ICD-10-CM | POA: Diagnosis not present

## 2014-02-27 LAB — HEPATIC FUNCTION PANEL
ALT: 13 U/L (ref 0–35)
AST: 24 U/L (ref 0–37)
Albumin: 2.9 g/dL — ABNORMAL LOW (ref 3.5–5.2)
Alkaline Phosphatase: 57 U/L (ref 39–117)
BILIRUBIN DIRECT: 0.1 mg/dL (ref 0.0–0.3)
BILIRUBIN TOTAL: 0.3 mg/dL (ref 0.2–1.2)
Indirect Bilirubin: 0.2 mg/dL (ref 0.2–1.2)
Total Protein: 4.8 g/dL — ABNORMAL LOW (ref 6.0–8.3)

## 2014-02-27 LAB — BASIC METABOLIC PANEL
BUN: 17 mg/dL (ref 6–23)
CHLORIDE: 108 meq/L (ref 96–112)
CO2: 24 meq/L (ref 19–32)
CREATININE: 1.24 mg/dL — AB (ref 0.50–1.10)
Calcium: 8.4 mg/dL (ref 8.4–10.5)
GLUCOSE: 84 mg/dL (ref 70–99)
Potassium: 4.6 mEq/L (ref 3.5–5.3)
Sodium: 137 mEq/L (ref 135–145)

## 2014-02-28 LAB — MICROALBUMIN / CREATININE URINE RATIO
CREATININE, URINE: 45.4 mg/dL
Microalb Creat Ratio: 3816.1 mg/g — ABNORMAL HIGH (ref 0.0–30.0)
Microalb, Ur: 173.25 mg/dL — ABNORMAL HIGH (ref 0.00–1.89)

## 2014-02-28 LAB — BRAIN NATRIURETIC PEPTIDE: Brain Natriuretic Peptide: 218 pg/mL — ABNORMAL HIGH (ref 0.0–100.0)

## 2014-03-01 DIAGNOSIS — R809 Proteinuria, unspecified: Secondary | ICD-10-CM | POA: Diagnosis not present

## 2014-03-01 NOTE — Progress Notes (Signed)
Patient notified and verbalized understanding of the test results. No further questions. Will get 24 hr urine done and f/u in 1 week.

## 2014-03-01 NOTE — Addendum Note (Signed)
Addended byCharolotte Capuchin D on: 03/01/2014 09:29 AM   Modules accepted: Orders

## 2014-03-03 ENCOUNTER — Emergency Department (HOSPITAL_COMMUNITY)
Admission: EM | Admit: 2014-03-03 | Discharge: 2014-03-03 | Disposition: A | Discharge status: Home / Self Care | Payer: Medicare Other | Attending: Emergency Medicine | Admitting: Emergency Medicine

## 2014-03-03 ENCOUNTER — Encounter (HOSPITAL_COMMUNITY): Payer: Self-pay | Admitting: Emergency Medicine

## 2014-03-03 DIAGNOSIS — Z8742 Personal history of other diseases of the female genital tract: Secondary | ICD-10-CM | POA: Diagnosis not present

## 2014-03-03 DIAGNOSIS — G8929 Other chronic pain: Secondary | ICD-10-CM | POA: Insufficient documentation

## 2014-03-03 DIAGNOSIS — M129 Arthropathy, unspecified: Secondary | ICD-10-CM | POA: Diagnosis not present

## 2014-03-03 DIAGNOSIS — I10 Essential (primary) hypertension: Secondary | ICD-10-CM | POA: Diagnosis not present

## 2014-03-03 DIAGNOSIS — F3289 Other specified depressive episodes: Secondary | ICD-10-CM | POA: Insufficient documentation

## 2014-03-03 DIAGNOSIS — E785 Hyperlipidemia, unspecified: Secondary | ICD-10-CM | POA: Diagnosis not present

## 2014-03-03 DIAGNOSIS — Z79899 Other long term (current) drug therapy: Secondary | ICD-10-CM | POA: Insufficient documentation

## 2014-03-03 DIAGNOSIS — J209 Acute bronchitis, unspecified: Secondary | ICD-10-CM | POA: Diagnosis not present

## 2014-03-03 DIAGNOSIS — I4891 Unspecified atrial fibrillation: Secondary | ICD-10-CM | POA: Insufficient documentation

## 2014-03-03 DIAGNOSIS — F329 Major depressive disorder, single episode, unspecified: Secondary | ICD-10-CM | POA: Diagnosis not present

## 2014-03-03 DIAGNOSIS — Z950 Presence of cardiac pacemaker: Secondary | ICD-10-CM | POA: Insufficient documentation

## 2014-03-03 DIAGNOSIS — J40 Bronchitis, not specified as acute or chronic: Secondary | ICD-10-CM

## 2014-03-03 DIAGNOSIS — Z7902 Long term (current) use of antithrombotics/antiplatelets: Secondary | ICD-10-CM | POA: Insufficient documentation

## 2014-03-03 DIAGNOSIS — F411 Generalized anxiety disorder: Secondary | ICD-10-CM | POA: Insufficient documentation

## 2014-03-03 DIAGNOSIS — Z880 Allergy status to penicillin: Secondary | ICD-10-CM | POA: Insufficient documentation

## 2014-03-03 MED ORDER — HYDROCOD POLST-CHLORPHEN POLST 10-8 MG/5ML PO LQCR: 5.0000 mL | Freq: Two times a day (BID) | ORAL | Status: DC | PRN

## 2014-03-03 MED ORDER — PREDNISONE 20 MG PO TABS: ORAL_TABLET | ORAL | Status: DC

## 2014-03-03 MED ORDER — CIPROFLOXACIN HCL 500 MG PO TABS: 500.0000 mg | ORAL_TABLET | Freq: Two times a day (BID) | ORAL | Status: DC

## 2014-03-03 NOTE — ED Notes (Signed)
Pt c/o cough that is productive with yellow sputum production, worse at night, has hx of bronchitis and states that this feels the same, denies any vomiting but states that she has been nauseous at times,

## 2014-03-03 NOTE — ED Notes (Signed)
MD at bedside. 

## 2014-03-03 NOTE — ED Provider Notes (Signed)
CSN: [id number]     Arrival date & time 03/03/14  1024 History  This chart was scribed for Kelsey Christen, MD by Delphia Grates, ED Scribe. This patient was seen in room APA06/[facility]06 and the patient's care was started at 11:10 AM.      Chief Complaint  Patient presents with  . Cough      The history is provided by the patient. No language interpreter was used.    HPI Comments: Kelsey Sandoval is a 80 y.o. female who presents to the Emergency Department complaining of gradually worsening cough. Patient states she has a history of bronchitis and has taken Prednisone pills (about 10 days) and prescribed cough syrup with improvement. There is associated wheezing. Patient states the cough is worse at night. She denies emesis. No fever, sweats, chills, rusty sputum. Severity is mild to moderate. No chest pain or dyspnea  Past Medical History  Diagnosis Date  . Hypertension   . Dysrhythmia   . Anxiety   . Depression   . Pacemaker   . Arthritis   . Nephrotic syndrome   . Hyperlipidemia   . Pre-diabetes   . Osteopenia   . Atrial fibrillation   . Back pain, chronic    Past Surgical History  Procedure Laterality Date  . Insert / replace / remove pacemaker    . Appendectomy    . Abdominal hysterectomy      partial-pt has no ovaries  . Back surgery    . Colonoscopy  9/05  . Dual chamber pacemaker      2012  . Colonoscopy N/A 10/11/2013    Procedure: COLONOSCOPY;  Surgeon: Rogene Houston, MD;  [facility]: [facility];  Service: Endoscopy;  Laterality: N/A;  1200  . Esophagogastroduodenoscopy N/A 01/17/2014    Procedure: ESOPHAGOGASTRODUODENOSCOPY (EGD);  Surgeon: Rogene Houston, MD;  Location: [facility];  Service: Endoscopy;  Laterality: N/A;  230  . Esophageal biopsy N/A 01/17/2014    Procedure: BIOPSY;  Surgeon: Rogene Houston, MD;  Location: [facility];  Service: Endoscopy;  Laterality: N/A;   Family History  Problem Relation Age of Onset  . Colon cancer Brother     History  Substance Use Topics  . Smoking status: Never Smoker   . Smokeless tobacco: Not on file  . Alcohol Use: No   OB History   Grav Para Term Preterm Abortions TAB SAB Ect Mult Living                 Review of Systems A complete 10 system review of systems was obtained and all systems are negative except as noted in the HPI and PMH.     Allergies  Levaquin; Penicillins; Sulfa antibiotics; and Zithromax  Home Medications   Prior to Admission medications   Medication Sig Start Date End Date Taking? Authorizing Provider  albuterol (PROVENTIL HFA;VENTOLIN HFA) 108 (90 BASE) MCG/ACT inhaler Inhale 2 puffs into the lungs every 6 (six) hours as needed for wheezing or shortness of breath.   Yes Historical Provider, MD  ALPRAZolam Duanne Moron) 0.5 MG tablet Take 1 tablet (0.5 mg total) by mouth 2 (two) times daily as needed for anxiety. 12/08/13  Yes Kathyrn Drown, MD  amitriptyline (ELAVIL) 25 MG tablet Take 1 tablet (25 mg total) by mouth at bedtime. 12/08/13  Yes Kathyrn Drown, MD  amLODipine (NORVASC) 10 MG tablet Take 10 mg by mouth daily.   Yes Historical Provider, MD  citalopram (CELEXA) 20 MG tablet Take  1 tablet (20 mg total) by mouth daily. 02/23/14  Yes Kathyrn Drown, MD  CRANBERRY PO Take 100 mg by mouth daily.   Yes Historical Provider, MD  dabigatran (PRADAXA) 150 MG CAPS capsule Take 150 mg by mouth 2 (two) times daily.   Yes Historical Provider, MD  flecainide (TAMBOCOR) 50 MG tablet Take 1 tablet (50 mg total) by mouth 2 (two) times daily. 06/06/13  Yes Mihai Croitoru, MD  HYDROcodone-acetaminophen (NORCO/VICODIN) 5-325 MG per tablet Take 1 tablet by mouth every 8 (eight) hours as needed for moderate pain. 02/23/14  Yes Kathyrn Drown, MD  lisinopril (PRINIVIL,ZESTRIL) 40 MG tablet Take 1 tablet (40 mg total) by mouth daily. 09/18/13  Yes Mihai Croitoru, MD  metoprolol succinate (TOPROL-XL) 50 MG 24 hr tablet Take 1 tablet (50 mg total) by mouth daily. Take with or immediately  following a meal. 12/08/13  Yes Kathyrn Drown, MD  pravastatin (PRAVACHOL) 80 MG tablet Take 1 tablet (80 mg total) by mouth daily. 02/23/14  Yes Kathyrn Drown, MD  vitamin B-12 (CYANOCOBALAMIN) 1000 MCG tablet Take 1,000 mcg by mouth 2 (two) times daily.   Yes Historical Provider, MD  chlorpheniramine-HYDROcodone (TUSSIONEX PENNKINETIC ER) 10-8 MG/5ML LQCR Take 5 mLs by mouth every 12 (twelve) hours as needed for cough. 03/03/14   Kelsey Christen, MD  ciprofloxacin (CIPRO) 500 MG tablet Take 1 tablet (500 mg total) by mouth 2 (two) times daily. 03/03/14   Kelsey Christen, MD  predniSONE (DELTASONE) 20 MG tablet 3 tabs po daily x 3 days, then 2 tabs x 3 days, then 1.5 tabs x 3 days, then 1 tab x 3 days, then 0.5 tabs x 3 days 03/03/14   Kelsey Christen, MD   Triage Vitals: BP 166/92  Pulse 83  Temp(Src) 98.5 F (36.9 C) (Oral)  Resp 20  Ht 5\' 7"  (1.702 m)  Wt 160 lb (72.576 kg)  BMI 25.05 kg/m2  SpO2 98%  Physical Exam  Nursing note and vitals reviewed. Constitutional: She is oriented to person, place, and time. She appears well-developed and well-nourished.  HENT:  Head: Normocephalic and atraumatic.  Eyes: Conjunctivae and EOM are normal. Pupils are equal, round, and reactive to light.  Neck: Normal range of motion. Neck supple.  Cardiovascular: Normal rate, regular rhythm and normal heart sounds.   Pulmonary/Chest: Effort normal and breath sounds normal.  Abdominal: Soft. Bowel sounds are normal.  Musculoskeletal: Normal range of motion.  Neurological: She is alert and oriented to person, place, and time.  Skin: Skin is warm and dry.  Psychiatric: She has a normal mood and affect. Her behavior is normal.    ED Course  Procedures (including critical care time)  DIAGNOSTIC STUDIES: Oxygen Saturation is 98% on room air, normal by my interpretation.    COORDINATION OF CARE: At 1115 Discussed treatment plan with patient which includes Cipro, Prednisone, and cough syrup. Patient agrees.   Labs  Review Labs Reviewed - No data to display  Imaging Review No results found.   EKG Interpretation None      MDM   Final diagnoses:  Bronchitis    Patient is hemodynamically stable. Rx Cipro, Tussionex, prednisone  I personally performed the services described in this documentation, which was scribed in my presence. The recorded information has been reviewed and is accurate.    Kelsey Christen, MD 03/03/14 [phone]

## 2014-03-03 NOTE — Discharge Instructions (Signed)
Bronchitis Bronchitis is inflammation of the airways that extend from the windpipe into the lungs (bronchi). The inflammation often causes mucus to develop, which leads to a cough. If the inflammation becomes severe, it may cause shortness of breath. CAUSES  Bronchitis may be caused by:   Viral infections.   Bacteria.   Cigarette smoke.   Allergens, pollutants, and other irritants.  SIGNS AND SYMPTOMS  The most common symptom of bronchitis is a frequent cough that produces mucus. Other symptoms include:  Fever.   Body aches.   Chest congestion.   Chills.   Shortness of breath.   Sore throat.  DIAGNOSIS  Bronchitis is usually diagnosed through a medical history and physical exam. Tests, such as chest X-rays, are sometimes done to rule out other conditions.  TREATMENT  You may need to avoid contact with whatever caused the problem (smoking, for example). Medicines are sometimes needed. These may include:  Antibiotics. These may be prescribed if the condition is caused by bacteria.  Cough suppressants. These may be prescribed for relief of cough symptoms.   Inhaled medicines. These may be prescribed to help open your airways and make it easier for you to breathe.   Steroid medicines. These may be prescribed for those with recurrent (chronic) bronchitis. HOME CARE INSTRUCTIONS  Get plenty of rest.   Drink enough fluids to keep your urine clear or pale yellow (unless you have a medical condition that requires fluid restriction). Increasing fluids may help thin your secretions and will prevent dehydration.   Only take over-the-counter or prescription medicines as directed by your health care provider.  Only take antibiotics as directed. Make sure you finish them even if you start to feel better.  Avoid secondhand smoke, irritating chemicals, and strong fumes. These will make bronchitis worse. If you are a smoker, quit smoking. Consider using nicotine gum or  skin patches to help control withdrawal symptoms. Quitting smoking will help your lungs heal faster.   Put a cool-mist humidifier in your bedroom at night to moisten the air. This may help loosen mucus. Change the water in the humidifier daily. You can also run the hot water in your shower and sit in the bathroom with the door closed for 5 10 minutes.   Follow up with your health care provider as directed.   Wash your hands frequently to avoid catching bronchitis again or spreading an infection to others.  SEEK MEDICAL CARE IF: Your symptoms do not improve after 1 week of treatment.  SEEK IMMEDIATE MEDICAL CARE IF:  Your fever increases.  You have chills.   You have chest pain.   You have worsening shortness of breath.   You have bloody sputum.  You faint.  You have lightheadedness.  You have a severe headache.   You vomit repeatedly. MAKE SURE YOU:   Understand these instructions.  Will watch your condition.  Will get help right away if you are not doing well or get worse. Document Released: 09/21/2005 Document Revised: 07/12/2013 Document Reviewed: 05/16/2013 [facility] Patient Information 2014 [place].   Prescriptions for antibiotic, cough syrup,  prednisone. followup your primary care Dr.

## 2014-03-05 LAB — CREATININE CLEARANCE, URINE, 24 HOUR
CREAT CLEAR: 56 mL/min — AB (ref 75–115)
CREATININE 24H UR: 1001 mg/d (ref 700–1800)
CREATININE, URINE: 41.7 mg/dL
CREATININE: 1.24 mg/dL — AB (ref 0.50–1.10)

## 2014-03-05 LAB — PROTEIN, URINE, 24 HOUR
PROTEIN 24H UR: 5232 mg/d — AB (ref 50–100)
Protein, Urine: 218 mg/dL

## 2014-03-06 ENCOUNTER — Encounter: Payer: Medicare Other | Admitting: *Deleted

## 2014-03-06 ENCOUNTER — Other Ambulatory Visit: Payer: Self-pay

## 2014-03-06 DIAGNOSIS — R7989 Other specified abnormal findings of blood chemistry: Secondary | ICD-10-CM

## 2014-03-09 ENCOUNTER — Telehealth: Payer: Self-pay | Admitting: Family Medicine

## 2014-03-09 NOTE — Telephone Encounter (Signed)
Patient calling to check on referral to nephrologist..

## 2014-03-12 NOTE — Telephone Encounter (Signed)
Patient called to check on this message and wanted Dr. Nicki Reaper to know that her appointment for her referral has yet to be scheduled and wants to know if he can do anything about this.

## 2014-03-12 NOTE — Telephone Encounter (Signed)
Called again to check on this.

## 2014-03-13 NOTE — Telephone Encounter (Signed)
Spoke with pt, ? Preference, pt would like to stay local if possible, referred to Dr. Lowanda Foster, await appt info

## 2014-03-15 ENCOUNTER — Other Ambulatory Visit (HOSPITAL_COMMUNITY): Payer: Self-pay | Admitting: Family Medicine

## 2014-03-20 ENCOUNTER — Other Ambulatory Visit: Payer: Self-pay | Admitting: Cardiovascular Disease

## 2014-03-20 NOTE — Telephone Encounter (Signed)
Rx was sent to pharmacy electronically. 

## 2014-03-23 ENCOUNTER — Telehealth: Payer: Self-pay | Admitting: *Deleted

## 2014-03-23 ENCOUNTER — Other Ambulatory Visit: Payer: Self-pay | Admitting: *Deleted

## 2014-03-23 MED ORDER — TORSEMIDE 20 MG PO TABS: 20.0000 mg | ORAL_TABLET | ORAL | Status: DC

## 2014-03-23 NOTE — Telephone Encounter (Signed)
Pt seen 05/22 for pedal edema. Went to ED on 05/30 for bronchitis. Given antibiotic and prednisone. Pt states while on prednisone her swelling went away. She finished prednisone 2 days ago and the swelling came back in legs and feet. She want to know if you can call in some more prednisone. She has a follow up with kidney doctor on July 27th. Or does she need office visit.

## 2014-03-23 NOTE — Telephone Encounter (Signed)
It is possible that the prednisone did in fact help her swelling but if I put her on prednisone currently and this will mass up any tests the kidney doctor does to try to help her situation. We can start a diuretic tablet for each morning that she could go ahead and take through the appointment with the specialist. I will also be happy to see her next week if she would like

## 2014-03-23 NOTE — Telephone Encounter (Signed)
Pt made follow up for next Thursday. Med sent to [facility] pt notified.

## 2014-03-23 NOTE — Telephone Encounter (Signed)
Pt does want diuretic. Please send into [facility] .

## 2014-03-29 ENCOUNTER — Emergency Department (HOSPITAL_COMMUNITY): Payer: Medicare Other

## 2014-03-29 ENCOUNTER — Encounter (HOSPITAL_COMMUNITY): Payer: Self-pay | Admitting: Emergency Medicine

## 2014-03-29 ENCOUNTER — Encounter: Payer: Self-pay | Admitting: Family Medicine

## 2014-03-29 ENCOUNTER — Ambulatory Visit (INDEPENDENT_AMBULATORY_CARE_PROVIDER_SITE_OTHER): Payer: Medicare Other | Admitting: Family Medicine

## 2014-03-29 ENCOUNTER — Emergency Department (HOSPITAL_COMMUNITY)
Admission: EM | Admit: 2014-03-29 | Discharge: 2014-03-29 | Disposition: A | Discharge status: Home / Self Care | Payer: Medicare Other | Attending: Emergency Medicine | Admitting: Emergency Medicine

## 2014-03-29 VITALS — BP 128/74 | Ht 69.0 in | Wt 167.0 lb

## 2014-03-29 DIAGNOSIS — W010XXA Fall on same level from slipping, tripping and stumbling without subsequent striking against object, initial encounter: Secondary | ICD-10-CM | POA: Insufficient documentation

## 2014-03-29 DIAGNOSIS — S52539A Colles' fracture of unspecified radius, initial encounter for closed fracture: Secondary | ICD-10-CM | POA: Insufficient documentation

## 2014-03-29 DIAGNOSIS — M129 Arthropathy, unspecified: Secondary | ICD-10-CM | POA: Insufficient documentation

## 2014-03-29 DIAGNOSIS — I10 Essential (primary) hypertension: Secondary | ICD-10-CM | POA: Diagnosis not present

## 2014-03-29 DIAGNOSIS — Z880 Allergy status to penicillin: Secondary | ICD-10-CM | POA: Insufficient documentation

## 2014-03-29 DIAGNOSIS — Y92009 Unspecified place in unspecified non-institutional (private) residence as the place of occurrence of the external cause: Secondary | ICD-10-CM | POA: Insufficient documentation

## 2014-03-29 DIAGNOSIS — M8580 Other specified disorders of bone density and structure, unspecified site: Secondary | ICD-10-CM

## 2014-03-29 DIAGNOSIS — Z7902 Long term (current) use of antithrombotics/antiplatelets: Secondary | ICD-10-CM | POA: Diagnosis not present

## 2014-03-29 DIAGNOSIS — G8929 Other chronic pain: Secondary | ICD-10-CM | POA: Insufficient documentation

## 2014-03-29 DIAGNOSIS — I4891 Unspecified atrial fibrillation: Secondary | ICD-10-CM | POA: Insufficient documentation

## 2014-03-29 DIAGNOSIS — E785 Hyperlipidemia, unspecified: Secondary | ICD-10-CM | POA: Diagnosis not present

## 2014-03-29 DIAGNOSIS — Z23 Encounter for immunization: Secondary | ICD-10-CM

## 2014-03-29 DIAGNOSIS — F329 Major depressive disorder, single episode, unspecified: Secondary | ICD-10-CM | POA: Diagnosis not present

## 2014-03-29 DIAGNOSIS — M899 Disorder of bone, unspecified: Secondary | ICD-10-CM | POA: Diagnosis not present

## 2014-03-29 DIAGNOSIS — F411 Generalized anxiety disorder: Secondary | ICD-10-CM | POA: Insufficient documentation

## 2014-03-29 DIAGNOSIS — S52509A Unspecified fracture of the lower end of unspecified radius, initial encounter for closed fracture: Secondary | ICD-10-CM | POA: Diagnosis not present

## 2014-03-29 DIAGNOSIS — IMO0002 Reserved for concepts with insufficient information to code with codable children: Secondary | ICD-10-CM | POA: Diagnosis not present

## 2014-03-29 DIAGNOSIS — Z950 Presence of cardiac pacemaker: Secondary | ICD-10-CM | POA: Insufficient documentation

## 2014-03-29 DIAGNOSIS — R809 Proteinuria, unspecified: Secondary | ICD-10-CM

## 2014-03-29 DIAGNOSIS — M949 Disorder of cartilage, unspecified: Secondary | ICD-10-CM | POA: Diagnosis not present

## 2014-03-29 DIAGNOSIS — Y9389 Activity, other specified: Secondary | ICD-10-CM | POA: Insufficient documentation

## 2014-03-29 DIAGNOSIS — Z87448 Personal history of other diseases of urinary system: Secondary | ICD-10-CM | POA: Insufficient documentation

## 2014-03-29 DIAGNOSIS — Z79899 Other long term (current) drug therapy: Secondary | ICD-10-CM | POA: Insufficient documentation

## 2014-03-29 DIAGNOSIS — F3289 Other specified depressive episodes: Secondary | ICD-10-CM | POA: Insufficient documentation

## 2014-03-29 DIAGNOSIS — S52532A Colles' fracture of left radius, initial encounter for closed fracture: Secondary | ICD-10-CM

## 2014-03-29 MED ORDER — LIDOCAINE HCL (PF) 2 % IJ SOLN
INTRAMUSCULAR | Status: AC
  Administered 2014-03-29: 23:00:00
  Filled 2014-03-29: qty 10

## 2014-03-29 MED ORDER — SODIUM CHLORIDE 0.9 % IV SOLN
INTRAVENOUS | Status: DC
  Administered 2014-03-29: 22:00:00 via INTRAVENOUS

## 2014-03-29 MED ORDER — FENTANYL CITRATE 0.05 MG/ML IJ SOLN
100.0000 ug | INTRAMUSCULAR | Status: DC | PRN
Start: 2014-03-29 — End: 2014-03-30
  Administered 2014-03-29: 100 ug via INTRAVENOUS
  Filled 2014-03-29: qty 2

## 2014-03-29 MED ORDER — ONDANSETRON HCL 4 MG/2ML IJ SOLN
4.0000 mg | Freq: Once | INTRAMUSCULAR | Status: AC
  Administered 2014-03-29: 4 mg via INTRAVENOUS
  Filled 2014-03-29: qty 2

## 2014-03-29 MED ORDER — OXYCODONE-ACETAMINOPHEN 5-325 MG PO TABS: 1.0000 | ORAL_TABLET | ORAL | Status: DC | PRN

## 2014-03-29 NOTE — Consult Note (Signed)
Reason for Consult:displaced fracture of the right distal radius Referring Physician: ER  Kelsey Sandoval is an 80 y.o. female.  HPI: She fell in her yard while watering plants.  She hurt her right wrist.  She is left hand dominant.  She has deformity of the wrist.  She has no other injury. She is on a blood thinner.  Past Medical History  Diagnosis Date  . Hypertension   . Dysrhythmia   . Anxiety   . Depression   . Pacemaker   . Arthritis   . Nephrotic syndrome   . Hyperlipidemia   . Pre-diabetes   . Osteopenia   . Atrial fibrillation   . Back pain, chronic     Past Surgical History  Procedure Laterality Date  . Insert / replace / remove pacemaker    . Appendectomy    . Abdominal hysterectomy      partial-pt has no ovaries  . Back surgery    . Colonoscopy  9/05  . Dual chamber pacemaker      2012  . Colonoscopy N/A 10/11/2013    Procedure: COLONOSCOPY;  Surgeon: Rogene Houston, MD;  [facility];  Service: Endoscopy;  Laterality: N/A;  1200  . Esophagogastroduodenoscopy N/A 01/17/2014    Procedure: ESOPHAGOGASTRODUODENOSCOPY (EGD);  Surgeon: Rogene Houston, MD;  [facility];  Service: Endoscopy;  Laterality: N/A;  230  . Esophageal biopsy N/A 01/17/2014    Procedure: BIOPSY;  Surgeon: Rogene Houston, MD;  [facility];  Service: Endoscopy;  Laterality: N/A;    Family History  Problem Relation Age of Onset  . Colon cancer Brother     Social History:  reports that she has never smoked. She does not have any smokeless tobacco history on file. She reports that she does not drink alcohol or use illicit drugs.  Allergies:  Allergies  Allergen Reactions  . Levaquin [Levofloxacin] Nausea Only  . Penicillins Other (See Comments)    Caused patient to pass out.  . Sulfa Antibiotics Other (See Comments)    Unknown  . Zithromax [Azithromycin] Nausea Only and Rash    Medications: I have reviewed the patient's current medications.  No  results found for this or any previous visit (from the past 48 hour(s)).  Dg Forearm Right  03/29/2014   CLINICAL DATA:  Pain.  EXAM: RIGHT FOREARM - 2 VIEW  COMPARISON:  None.  FINDINGS: Comminuted, displaced, angulated fracture of the distal left radius is present with extension into the radiocarpal joint space. Ulnar styloid fracture is present. This is slightly displaced. No other focal abnormality.  IMPRESSION: Comminuted, displaced, angulated fracture of the distal left radius. Displaced ulnar styloid fracture.   Electronically Signed   By: Marcello Moores  Register   On: 03/29/2014 21:39   Dg Hand Complete Right  03/29/2014   CLINICAL DATA:  Pain in right hand and forearm.  Fall.  EXAM: RIGHT HAND - COMPLETE 3+ VIEW  COMPARISON:  None.  FINDINGS: Colles fracture of the distal radius. Transverse fracture ulnar styloid.  Degenerative spurring at the first carpometacarpal articulation.  Bony demineralization.  IMPRESSION: 1. Colles fracture of the distal radius. 2. Transverse fracture ulnar styloid. 3. Degenerative arthropathy of the first carpometacarpal articulation.   Electronically Signed   By: Sherryl Barters M.D.   On: 03/29/2014 21:38    Review of Systems  Cardiovascular:       PAT and also mitral insufficiency.  On blood thinner.  Musculoskeletal: Positive for falls (  [address] in her yard [employer]. Hurt right wrist.  She is left hand dominant. Deformity of right wrist, pain, decreased motion.).   Blood pressure 190/89, pulse 92, temperature 98.3 F (36.8 C), temperature source Oral, resp. rate 20, height 5\' 10"  (1.778 m), weight 74.844 kg (165 lb), SpO2 97.00%. Physical Exam  Constitutional: She is oriented to person, place, and time. She appears well-developed and well-nourished.  HENT:  Head: Normocephalic and atraumatic.  Eyes: Conjunctivae and EOM are normal. Pupils are equal, round, and reactive to light.  Neck: Normal range of motion. Neck supple.  Cardiovascular: Normal  rate and intact distal pulses.   Respiratory: Effort normal.  GI: Soft.  Musculoskeletal: She exhibits tenderness (Pain, deformity right wrist, dorsal angulation and radial shortening.  NV intact.).       Right wrist: She exhibits decreased range of motion, tenderness, bony tenderness, swelling, crepitus and deformity.       Arms: Neurological: She is alert and oriented to person, place, and time. She has normal reflexes.  Skin: Skin is warm and dry.  Psychiatric: She has a normal mood and affect. Her behavior is normal. Judgment and thought content normal.    Assessment/Plan: Displaced dorsally right distal radius fracture.  I have explained the findings of the x-rays.  I told her I would do a closed reduction tonight.  She might need surgery later.  She will be placed in a sugar tong splint.  She and family present appeared to understand and agreed to procedure.  I used 10 cc of 2% Xylocaine as hematoma block after sterile prep.  Closed reduction carried out.  Sugar tong splint applied.  I am waiting for x-rays to be done post reduction.    I will give her Percocet home pack of 5/325, dispense six pills, one every three hours as needed.  Elevate, ice, use sling.  I will see her in the office tomorrow morning.  Come back to ER if any problem.    KEELING,WAYNE 03/29/2014, 10:39 PM

## 2014-03-29 NOTE — ED Provider Notes (Signed)
CSN: [id number]     Arrival date & time 03/29/14  2040 History   First MD Initiated Contact with Patient 03/29/14 2136     Chief Complaint  Patient presents with  . Arm Injury     (Consider location/radiation/quality/duration/timing/severity/associated sxs/prior Treatment) Patient is a 80 y.o. female presenting with arm injury. The history is provided by the patient.  Arm Injury  She accidentally stepped in a hole, which caused her to fall backwards onto her right hand. She injured her right hand and forearm in the fall. Her pain is increased with palpation or movement of the left forearm. She denies head injury, neck injury, back pain, nausea, vomiting, chest pain, weakness, or dizziness. There were no preceding symptoms. She is taking her usual medications. There are no other known modifying factors.  Past Medical History  Diagnosis Date  . Hypertension   . Dysrhythmia   . Anxiety   . Depression   . Pacemaker   . Arthritis   . Nephrotic syndrome   . Hyperlipidemia   . Pre-diabetes   . Osteopenia   . Atrial fibrillation   . Back pain, chronic    Past Surgical History  Procedure Laterality Date  . Insert / replace / remove pacemaker    . Appendectomy    . Abdominal hysterectomy      partial-pt has no ovaries  . Back surgery    . Colonoscopy  9/05  . Dual chamber pacemaker      2012  . Colonoscopy N/A 10/11/2013    Procedure: COLONOSCOPY;  Surgeon: Rogene Houston, MD;  [facility];  Service: Endoscopy;  Laterality: N/A;  1200  . Esophagogastroduodenoscopy N/A 01/17/2014    Procedure: ESOPHAGOGASTRODUODENOSCOPY (EGD);  Surgeon: Rogene Houston, MD;  Location: [facility];  Service: Endoscopy;  Laterality: N/A;  230  . Esophageal biopsy N/A 01/17/2014    Procedure: BIOPSY;  Surgeon: Rogene Houston, MD;  [facility]: [facility];  Service: Endoscopy;  Laterality: N/A;   Family History  Problem Relation Age of Onset  . Colon cancer Brother    History   Substance Use Topics  . Smoking status: Never Smoker   . Smokeless tobacco: Not on file  . Alcohol Use: No   OB History   Grav Para Term Preterm Abortions TAB SAB Ect Mult Living                 Review of Systems  All other systems reviewed and are negative.     Allergies  Levaquin; Penicillins; Sulfa antibiotics; and Zithromax  Home Medications   Prior to Admission medications   Medication Sig Start Date End Date Taking? Authorizing Provider  albuterol (PROVENTIL HFA;VENTOLIN HFA) 108 (90 BASE) MCG/ACT inhaler Inhale 2 puffs into the lungs every 6 (six) hours as needed for wheezing or shortness of breath.    Historical Provider, MD  ALPRAZolam Duanne Moron) 0.5 MG tablet Take 1 tablet (0.5 mg total) by mouth 2 (two) times daily as needed for anxiety. 12/08/13   Kathyrn Drown, MD  amitriptyline (ELAVIL) 25 MG tablet Take 1 tablet (25 mg total) by mouth at bedtime. 12/08/13   Kathyrn Drown, MD  amLODipine (NORVASC) 10 MG tablet Take 10 mg by mouth daily.    Historical Provider, MD  citalopram (CELEXA) 20 MG tablet Take 1 tablet (20 mg total) by mouth daily. 02/23/14   Kathyrn Drown, MD  CRANBERRY PO Take 100 mg by mouth daily.    Historical  Provider, MD  flecainide (TAMBOCOR) 50 MG tablet Take 1 tablet (50 mg total) by mouth 2 (two) times daily. 06/06/13   Mihai Croitoru, MD  HYDROcodone-acetaminophen (NORCO/VICODIN) 5-325 MG per tablet Take 1 tablet by mouth every 8 (eight) hours as needed for moderate pain. 02/23/14   Kathyrn Drown, MD  lisinopril (PRINIVIL,ZESTRIL) 40 MG tablet Take 1 tablet (40 mg total) by mouth daily. 09/18/13   Mihai Croitoru, MD  metoprolol succinate (TOPROL-XL) 50 MG 24 hr tablet Take 1 tablet (50 mg total) by mouth daily. Take with or immediately following a meal. 12/08/13   Kathyrn Drown, MD  PRADAXA 150 MG CAPS capsule TAKE ONE CAPSULE BY MOUTH TWICE DAILY 03/20/14   Lorretta Harp, MD  pravastatin (PRAVACHOL) 80 MG tablet Take 1 tablet (80 mg total) by mouth  daily. 02/23/14   Kathyrn Drown, MD  torsemide (DEMADEX) 20 MG tablet Take 1 tablet (20 mg total) by mouth every morning. 03/23/14   Kathyrn Drown, MD  vitamin B-12 (CYANOCOBALAMIN) 1000 MCG tablet Take 1,000 mcg by mouth 2 (two) times daily.    Historical Provider, MD   BP 190/89  Pulse 92  Temp(Src) 98.3 F (36.8 C) (Oral)  Resp 20  Ht 5\' 10"  (1.778 m)  Wt 165 lb (74.844 kg)  BMI 23.68 kg/m2  SpO2 97% Physical Exam  Nursing note and vitals reviewed. Constitutional: She is oriented to person, place, and time. She appears well-developed.  Elderly, frail  HENT:  Head: Normocephalic and atraumatic.  Eyes: Conjunctivae and EOM are normal. Pupils are equal, round, and reactive to light.  Neck: Normal range of motion and phonation normal. Neck supple.  Cardiovascular: Normal rate, regular rhythm and intact distal pulses.   Pulmonary/Chest: Effort normal and breath sounds normal. She exhibits no tenderness.  Abdominal: Soft. She exhibits no distension. There is no tenderness. There is no guarding.  Musculoskeletal:  Tenderness swelling, and deformity, left distal forearm. No left elbow tenderness. No swelling of the left shoulder or elbow. Normal intact distal sensation, circulation, fingers of left hand.  Neurological: She is alert and oriented to person, place, and time. She exhibits normal muscle tone.  Skin: Skin is warm and dry.  Psychiatric: She has a normal mood and affect. Her behavior is normal. Judgment and thought content normal.    ED Course  Procedures (including critical care time) Medications  ondansetron (ZOFRAN) injection 4 mg (4 mg Intravenous Given 03/29/14 2202)  lidocaine (XYLOCAINE) 2 % injection (  Given by Other 03/29/14 2246)    Patient Vitals for the past 24 hrs:  BP Temp Temp src Pulse Resp SpO2 Height Weight  03/29/14 2046 190/89 mmHg 98.3 F (36.8 C) Oral 92 20 97 % 5\' 10"  (1.778 m) 165 lb (74.844 kg)      21:57- discussed with Dr. Luna Glasgow,  orthopedist, he will see the patient and reduce the fracture   Labs Review Labs Reviewed - No data to display  Imaging Review Dg Forearm Right  03/29/2014   CLINICAL DATA:  Pain.  EXAM: RIGHT FOREARM - 2 VIEW  COMPARISON:  None.  FINDINGS: Comminuted, displaced, angulated fracture of the distal left radius is present with extension into the radiocarpal joint space. Ulnar styloid fracture is present. This is slightly displaced. No other focal abnormality.  IMPRESSION: Comminuted, displaced, angulated fracture of the distal left radius. Displaced ulnar styloid fracture.   Electronically Signed   By: Marcello Moores  Register   On: 03/29/2014 21:39   Dg  Wrist 2 Views Right  03/29/2014   CLINICAL DATA:  Postreduction views of the right wrist  EXAM: RIGHT WRIST - 2 VIEW  COMPARISON:  None.  FINDINGS: Plaster splint obscures bony detail.  Dramatically improved alignment and delamination of angulation at the distal radial fracture site. Ulnar styloid transverse fracture noted.  IMPRESSION: 1. Dramatically improved alignment and elimination of angulation at the distal radial fracture site, status post external reduction and plaster splinting.   Electronically Signed   By: Sherryl Barters M.D.   On: 03/29/2014 23:25   Dg Hand Complete Right  03/29/2014   CLINICAL DATA:  Pain in right hand and forearm.  Fall.  EXAM: RIGHT HAND - COMPLETE 3+ VIEW  COMPARISON:  None.  FINDINGS: Colles fracture of the distal radius. Transverse fracture ulnar styloid.  Degenerative spurring at the first carpometacarpal articulation.  Bony demineralization.  IMPRESSION: 1. Colles fracture of the distal radius. 2. Transverse fracture ulnar styloid. 3. Degenerative arthropathy of the first carpometacarpal articulation.   Electronically Signed   By: Sherryl Barters M.D.   On: 03/29/2014 21:38     EKG Interpretation None      MDM   Final diagnoses:  Colles' fracture, left, closed, initial encounter    Accidental fall, isolated  injury, left wrist  Nursing Notes Reviewed/ Care Coordinated, and agree without changes. Applicable Imaging Reviewed.  Interpretation of Laboratory Data incorporated into ED treatment  Orthopedic treatment as per attending orthopedist in ED.    Richarda Blade, MD 03/30/14 [phone]

## 2014-03-29 NOTE — Progress Notes (Signed)
   Subjective:    Patient ID: Kelsey Sandoval, female    DOB: [birth year], 80 y.o.   MRN: [id number]  HPIFollow up on swelling in legs and feet. Taking torsemide 20mg  one in the am. Patient states the swelling is doing much better.   They state she doing much better breathing well denies any chest tightness pressure pain states swelling is down  Review of Systems    see above Objective:   Physical Exam Lungs clear hearts regular pulse normal extremities trace edema   15 minutes was spent discussing the results of her tests.    Assessment & Plan:  Will discuss case with nephrologist to see if there is any additional tests that they would like to have done before they see her toward the end of July  Proteinuria probably nephrotic syndrome patient had this before back in the 80s. She stated went into remission.

## 2014-03-29 NOTE — ED Notes (Signed)
Pt with right deformity to right wrist and forearm after falling backwards after slipping on water, denies hitting head

## 2014-03-29 NOTE — ED Notes (Signed)
Pt reporting slipping and falling backwards. Reports injury to right hand and wrist.  Swelling and deformity noted.  Extremity elevated and ice applied.

## 2014-04-04 MED FILL — Oxycodone w/ Acetaminophen Tab 5-325 MG: ORAL | Qty: 6 | Status: AC

## 2014-04-06 DIAGNOSIS — I10 Essential (primary) hypertension: Secondary | ICD-10-CM | POA: Diagnosis not present

## 2014-04-06 DIAGNOSIS — S62109A Fracture of unspecified carpal bone, unspecified wrist, initial encounter for closed fracture: Secondary | ICD-10-CM | POA: Diagnosis not present

## 2014-04-10 ENCOUNTER — Other Ambulatory Visit (HOSPITAL_COMMUNITY): Payer: Medicare Other

## 2014-04-12 DIAGNOSIS — S52539A Colles' fracture of unspecified radius, initial encounter for closed fracture: Secondary | ICD-10-CM | POA: Diagnosis not present

## 2014-04-12 DIAGNOSIS — I10 Essential (primary) hypertension: Secondary | ICD-10-CM | POA: Diagnosis not present

## 2014-04-26 DIAGNOSIS — I10 Essential (primary) hypertension: Secondary | ICD-10-CM | POA: Diagnosis not present

## 2014-04-26 DIAGNOSIS — S52539A Colles' fracture of unspecified radius, initial encounter for closed fracture: Secondary | ICD-10-CM | POA: Diagnosis not present

## 2014-04-30 ENCOUNTER — Encounter: Payer: Self-pay | Admitting: Cardiovascular Disease

## 2014-05-01 DIAGNOSIS — R809 Proteinuria, unspecified: Secondary | ICD-10-CM | POA: Diagnosis not present

## 2014-05-01 DIAGNOSIS — N189 Chronic kidney disease, unspecified: Secondary | ICD-10-CM | POA: Diagnosis not present

## 2014-05-01 DIAGNOSIS — D649 Anemia, unspecified: Secondary | ICD-10-CM | POA: Diagnosis not present

## 2014-05-01 DIAGNOSIS — I10 Essential (primary) hypertension: Secondary | ICD-10-CM | POA: Diagnosis not present

## 2014-05-08 ENCOUNTER — Encounter: Payer: Self-pay | Admitting: Cardiovascular Disease

## 2014-05-08 ENCOUNTER — Other Ambulatory Visit (HOSPITAL_COMMUNITY): Payer: Medicare Other

## 2014-05-09 ENCOUNTER — Other Ambulatory Visit (HOSPITAL_COMMUNITY): Payer: Self-pay | Admitting: Nephrology

## 2014-05-09 DIAGNOSIS — N289 Disorder of kidney and ureter, unspecified: Secondary | ICD-10-CM

## 2014-05-17 ENCOUNTER — Ambulatory Visit (HOSPITAL_COMMUNITY)
Admission: RE | Admit: 2014-05-17 | Discharge: 2014-05-17 | Disposition: A | Discharge status: Home / Self Care | Payer: Medicare Other | Source: Ambulatory Visit | Attending: Nephrology | Admitting: Nephrology

## 2014-05-17 DIAGNOSIS — R809 Proteinuria, unspecified: Secondary | ICD-10-CM | POA: Diagnosis not present

## 2014-05-17 DIAGNOSIS — N189 Chronic kidney disease, unspecified: Secondary | ICD-10-CM | POA: Insufficient documentation

## 2014-05-17 DIAGNOSIS — N289 Disorder of kidney and ureter, unspecified: Secondary | ICD-10-CM

## 2014-05-18 DIAGNOSIS — D649 Anemia, unspecified: Secondary | ICD-10-CM | POA: Diagnosis not present

## 2014-05-18 DIAGNOSIS — I10 Essential (primary) hypertension: Secondary | ICD-10-CM | POA: Diagnosis not present

## 2014-05-18 DIAGNOSIS — R809 Proteinuria, unspecified: Secondary | ICD-10-CM | POA: Diagnosis not present

## 2014-05-18 DIAGNOSIS — N189 Chronic kidney disease, unspecified: Secondary | ICD-10-CM | POA: Diagnosis not present

## 2014-05-18 DIAGNOSIS — Z79899 Other long term (current) drug therapy: Secondary | ICD-10-CM | POA: Diagnosis not present

## 2014-05-18 DIAGNOSIS — M25569 Pain in unspecified knee: Secondary | ICD-10-CM | POA: Diagnosis not present

## 2014-05-18 DIAGNOSIS — E559 Vitamin D deficiency, unspecified: Secondary | ICD-10-CM | POA: Diagnosis not present

## 2014-05-23 ENCOUNTER — Telehealth: Payer: Self-pay

## 2014-05-23 NOTE — Telephone Encounter (Signed)
Janelle (pharmacist at [facility]) called and stated that the Celexa and Flecainide prescribed for this patient is showing a drug interaction of QT interval prolongation. Notified pharmacist per Dr. Nicki Reaper to discontinue the Celexa due to drug interaction. Pharmacist stated that she will notify the patient.

## 2014-05-24 NOTE — Telephone Encounter (Signed)
Currently because of the heart medicine that the patient is on I do not recommend continuing Celexa. It could have a dangerous interaction. Her heart medicine is more important. I would recommend for the patient to consider following up to discuss other options if she feels she needs something in it's place of Celexa. If she would rather just use Xanax when necessary she could do that until her next visit. Please discontinue Celexa from the medicine list. Notify patient.

## 2014-05-24 NOTE — Telephone Encounter (Signed)
Discussed with patient. Pt states she will wait til appt next month to discuss and just use xanax for now.

## 2014-05-31 DIAGNOSIS — I10 Essential (primary) hypertension: Secondary | ICD-10-CM | POA: Diagnosis not present

## 2014-05-31 DIAGNOSIS — S52539A Colles' fracture of unspecified radius, initial encounter for closed fracture: Secondary | ICD-10-CM | POA: Diagnosis not present

## 2014-05-31 DIAGNOSIS — M25579 Pain in unspecified ankle and joints of unspecified foot: Secondary | ICD-10-CM | POA: Diagnosis not present

## 2014-05-31 DIAGNOSIS — M25569 Pain in unspecified knee: Secondary | ICD-10-CM | POA: Diagnosis not present

## 2014-06-07 ENCOUNTER — Encounter: Payer: Self-pay | Admitting: Cardiovascular Disease

## 2014-06-07 ENCOUNTER — Ambulatory Visit (INDEPENDENT_AMBULATORY_CARE_PROVIDER_SITE_OTHER): Payer: Medicare Other | Admitting: Cardiovascular Disease

## 2014-06-07 VITALS — BP 155/92 | HR 87 | Resp 16 | Ht 70.0 in | Wt 162.0 lb

## 2014-06-07 DIAGNOSIS — I4891 Unspecified atrial fibrillation: Secondary | ICD-10-CM

## 2014-06-07 DIAGNOSIS — I495 Sick sinus syndrome: Secondary | ICD-10-CM | POA: Diagnosis not present

## 2014-06-07 DIAGNOSIS — I480 Paroxysmal atrial fibrillation: Secondary | ICD-10-CM

## 2014-06-07 MED ORDER — FLECAINIDE ACETATE 50 MG PO TABS: 50.0000 mg | ORAL_TABLET | Freq: Two times a day (BID) | ORAL | Status: DC

## 2014-06-07 NOTE — Patient Instructions (Signed)
Remote monitoring is used to monitor your Pacemaker or ICD from home. This monitoring reduces the number of office visits required to check your device to one time per year. It allows [place] to monitor the functioning of your device to ensure it is working properly. You are scheduled for a device check from home on 09/07/2014. You may send your transmission at any time that day. If you have a wireless device, the transmission will be sent automatically. After your physician reviews your transmission, you will receive a postcard with your next transmission date.     Dr. Sallyanne Kuster recommends that you schedule a follow-up appointment in: ONE YEAR.

## 2014-06-07 NOTE — Progress Notes (Signed)
Patient ID: Kelsey Sandoval, female   DOB: 1932/12/16, 80 y.o.   MRN: [id number]      Reason for office visit Atrial fibrillation, hypertension, pacemaker check  Today is Teva's birthday. She turned 80 years old. She is currently doing well, but is apprehensive about possible need for left total knee replacement. She is wearing a brace. She does not it made her mind up whether she wants to go through with surgery. Earlier this year she fell and had a right radial Colles  fracture  She has not had any bleeding problems on a chronic anticoagulation, she has not had any symptoms of stroke/TIA or other embolic events. The rhythm today is atrial paced ventricular sensed. Interrogation of her pacemaker shows a very low burden of atrial arrhythmia (less than 0.1%). Chest had 3 episodes of high ventricular rates which are all atrial tachycardia with one-to-one conduction, the longest episode being only 9 seconds in duration. She has roughly 23% atrial pacing and virtually no ventricular pacing.   Allergies  Allergen Reactions  . Levaquin [Levofloxacin] Nausea Only  . Penicillins Other (See Comments)    Caused patient to pass out.  . Sulfa Antibiotics Other (See Comments)    Unknown  . Zithromax [Azithromycin] Nausea Only and Rash    Current Outpatient Prescriptions  Medication Sig Dispense Refill  . albuterol (PROVENTIL HFA;VENTOLIN HFA) 108 (90 BASE) MCG/ACT inhaler Inhale 2 puffs into the lungs every 6 (six) hours as needed for wheezing or shortness of breath.      . ALPRAZolam (XANAX) 0.5 MG tablet Take 1 tablet (0.5 mg total) by mouth 2 (two) times daily as needed for anxiety.  60 tablet  4  . amitriptyline (ELAVIL) 25 MG tablet Take 1 tablet (25 mg total) by mouth at bedtime.  30 tablet  0  . amLODipine (NORVASC) 10 MG tablet Take 10 mg by mouth daily.      Marland Kitchen CRANBERRY PO Take 100 mg by mouth daily.      . flecainide (TAMBOCOR) 50 MG tablet Take 1 tablet (50 mg total) by mouth 2 (two)  times daily.  180 tablet  3  . HYDROcodone-acetaminophen (NORCO/VICODIN) 5-325 MG per tablet Take 1 tablet by mouth every 8 (eight) hours as needed for moderate pain.      Marland Kitchen lisinopril (PRINIVIL,ZESTRIL) 40 MG tablet Take 1 tablet (40 mg total) by mouth daily.  30 tablet  8  . metoprolol succinate (TOPROL-XL) 50 MG 24 hr tablet Take 1 tablet (50 mg total) by mouth daily. Take with or immediately following a meal.  30 tablet  8  . PRADAXA 150 MG CAPS capsule TAKE ONE CAPSULE BY MOUTH TWICE DAILY  60 capsule  3  . pravastatin (PRAVACHOL) 80 MG tablet Take 1 tablet (80 mg total) by mouth daily.  30 tablet  12  . torsemide (DEMADEX) 20 MG tablet Take 1 tablet (20 mg total) by mouth every morning.  30 tablet  2  . vitamin B-12 (CYANOCOBALAMIN) 1000 MCG tablet Take 1,000 mcg by mouth 2 (two) times daily.       No current facility-administered medications for this visit.    Past Medical History  Diagnosis Date  . Hypertension   . Dysrhythmia   . Anxiety   . Depression   . Pacemaker   . Arthritis   . Nephrotic syndrome   . Hyperlipidemia   . Pre-diabetes   . Osteopenia   . Atrial fibrillation   . Back pain, chronic  Past Surgical History  Procedure Laterality Date  . Insert / replace / remove pacemaker    . Appendectomy    . Abdominal hysterectomy      partial-pt has no ovaries  . Back surgery    . Colonoscopy  9/05  . Dual chamber pacemaker      2012  . Colonoscopy N/A 10/11/2013    Procedure: COLONOSCOPY;  Surgeon: Rogene Houston, MD;  [facility];  Service: Endoscopy;  Laterality: N/A;  1200  . Esophagogastroduodenoscopy N/A 01/17/2014    Procedure: ESOPHAGOGASTRODUODENOSCOPY (EGD);  Surgeon: Rogene Houston, MD;  [facility];  Service: Endoscopy;  Laterality: N/A;  230  . Esophageal biopsy N/A 01/17/2014    Procedure: BIOPSY;  Surgeon: Rogene Houston, MD;  [facility]: [facility];  Service: Endoscopy;  Laterality: N/A;    Family History  Problem  Relation Age of Onset  . Colon cancer Brother     History   Social History  . Marital Status: Divorced    Spouse Name: N/A    Number of Children: N/A  . Years of Education: N/A   Occupational History  . Not on file.   Social History Main Topics  . Smoking status: Never Smoker   . Smokeless tobacco: Not on file  . Alcohol Use: No  . Drug Use: No  . Sexual Activity: Not on file   Other Topics Concern  . Not on file   Social History Narrative  . No narrative on file    Review of systems: The patient specifically denies any chest pain at rest or with exertion, dyspnea at rest or with exertion, orthopnea, paroxysmal nocturnal dyspnea, syncope, palpitations, focal neurological deficits, intermittent claudication, lower extremity edema, unexplained weight gain, cough, hemoptysis or wheezing.  The patient also denies abdominal pain, nausea, vomiting, dysphagia, diarrhea, constipation, polyuria, polydipsia, dysuria, hematuria, frequency, urgency, abnormal bleeding or bruising, fever, chills, unexpected weight changes, mood swings, change in skin or hair texture, change in voice quality, auditory or visual problems, allergic reactions or rashes, just chronic pain in her left knee.   PHYSICAL EXAM BP 155/92  Pulse 87  Resp 16  Ht 5\' 10"  (1.778 m)  Wt 162 lb (73.483 kg)  BMI 23.24 kg/m2 General: Alert, oriented x3, no distress  Head: no evidence of trauma, PERRL, EOMI, no exophtalmos or lid lag, no myxedema, no xanthelasma; normal ears, nose and oropharynx  Neck: normal jugular venous pulsations and no hepatojugular reflux; brisk carotid pulses without delay and no carotid bruits  Chest: clear to auscultation, no signs of consolidation by percussion or palpation, normal fremitus, symmetrical and full respiratory excursions; healthy left subclavian pacemaker site  Cardiovascular: normal position and quality of the apical impulse, regular rhythm, normal first and second heart sounds,  no rubs or gallops, 1/6 holosystolic apical murmur  Abdomen: no tenderness or distention, no masses by palpation, no abnormal pulsatility or arterial bruits, normal bowel sounds, no hepatosplenomegaly  Extremities: no clubbing, cyanosis or edema; 2+ radial, ulnar and brachial pulses bilaterally; 2+ right femoral, posterior tibial and dorsalis pedis pulses; 2+ left femoral, posterior tibial and dorsalis pedis pulses; no subclavian or femoral bruits  Neurological: grossly nonfocal   EKG: Atrial paced ventricular sensed   Lipid Panel     Component Value Date/Time   CHOL 172 07/11/2013 0942   TRIG 249* 07/11/2013 0942   HDL 47 07/11/2013 0942   CHOLHDL 3.7 07/11/2013 0942   VLDL 50* 07/11/2013 0942   LDLCALC  75 07/11/2013 0942    BMET    Component Value Date/Time   NA 137 02/27/2014 1008   K 4.6 02/27/2014 1008   CL 108 02/27/2014 1008   CO2 24 02/27/2014 1008   GLUCOSE 84 02/27/2014 1008   BUN 17 02/27/2014 1008   CREATININE 1.24* 03/01/2014 1017   CREATININE 1.24* 02/27/2014 1008   CREATININE 0.97 05/01/2011 1652   CALCIUM 8.4 02/27/2014 1008   GFRNONAA 56* 05/01/2011 1652   GFRAA >60 05/01/2011 1652     ASSESSMENT AND PLAN  Paroxysmal atrial fibrillation  None since September 2012 when flecainide was started. Note that prior to flecainide therapy she had a roughly 5% burden of atrial fibrillation. No history of embolic TIA or stroke. No serious bleeding complications. Tolerating flecainide well. Normal nuclear stress test at therapy initiation 2011. Consider periodic reevaluation to make sure that flecainide is safe to use in this septuagenarian.  If she does need knee replacement surgery, I don't think she requires "bridging" with heparin. Simply stop her anticoagulants 48 hours before surgery and resume as soon as safe after surgery both for her atrial fibrillation and for DVT/PE prophylaxis.  Tachycardia-bradycardia syndrome   Pacemaker  Normal function of dual-chamber Medtronic device  implanted in August of 2012. Comprehensive pacemaker check, including testing of lead thresholds was performed today. Good lead parameters and battery voltage. Rare episodes of mode switch lasting less than 10 seconds have been detected. Roughly 20% atrial pacing historically, no ventricular pacing   Mitral insufficiency  This was estimated to be moderate at initial evaluation but only mild by transesophageal echo   Systemic hypertension  Excellent control usually. Her blood pressure is slightly high today. She blames on being upset with her gentleman friend/driver who has developed some degree of dementia and got lost on the way to the office today.  Orders Placed This Encounter  Procedures  . EKG 12-Lead   No orders of the defined types were placed in this encounter.    Holli Humbles, MD, [place] [phone] office [phone] pager

## 2014-06-08 ENCOUNTER — Telehealth: Payer: Self-pay | Admitting: Family Medicine

## 2014-06-08 MED ORDER — ALPRAZOLAM 0.5 MG PO TABS: 0.5000 mg | ORAL_TABLET | Freq: Two times a day (BID) | ORAL | Status: DC | PRN

## 2014-06-08 NOTE — Telephone Encounter (Signed)
Rx printed and faxed to pharmacy. Patient notified.

## 2014-06-08 NOTE — Telephone Encounter (Signed)
Ok plus five ref if time

## 2014-06-08 NOTE — Telephone Encounter (Signed)
Patient needs Rx xanax to [facility].

## 2014-06-09 ENCOUNTER — Other Ambulatory Visit: Payer: Self-pay | Admitting: Family Medicine

## 2014-06-12 DIAGNOSIS — R809 Proteinuria, unspecified: Secondary | ICD-10-CM | POA: Diagnosis not present

## 2014-06-12 DIAGNOSIS — N183 Chronic kidney disease, stage 3 unspecified: Secondary | ICD-10-CM | POA: Diagnosis not present

## 2014-06-12 DIAGNOSIS — D649 Anemia, unspecified: Secondary | ICD-10-CM | POA: Diagnosis not present

## 2014-06-12 DIAGNOSIS — I10 Essential (primary) hypertension: Secondary | ICD-10-CM | POA: Diagnosis not present

## 2014-06-19 ENCOUNTER — Ambulatory Visit (INDEPENDENT_AMBULATORY_CARE_PROVIDER_SITE_OTHER): Payer: Medicare Other | Admitting: Family Medicine

## 2014-06-19 VITALS — BP 122/80 | Ht 69.0 in | Wt 162.0 lb

## 2014-06-19 DIAGNOSIS — Z23 Encounter for immunization: Secondary | ICD-10-CM

## 2014-06-19 DIAGNOSIS — M1710 Unilateral primary osteoarthritis, unspecified knee: Secondary | ICD-10-CM

## 2014-06-19 DIAGNOSIS — M1712 Unilateral primary osteoarthritis, left knee: Secondary | ICD-10-CM

## 2014-06-19 DIAGNOSIS — N049 Nephrotic syndrome with unspecified morphologic changes: Secondary | ICD-10-CM | POA: Diagnosis not present

## 2014-06-19 MED ORDER — LISINOPRIL 40 MG PO TABS: 40.0000 mg | ORAL_TABLET | Freq: Every day | ORAL | Status: DC

## 2014-06-19 MED ORDER — AMLODIPINE BESYLATE 10 MG PO TABS: ORAL_TABLET | ORAL | Status: DC

## 2014-06-19 MED ORDER — HYDROCODONE-ACETAMINOPHEN 5-325 MG PO TABS: ORAL_TABLET | ORAL | Status: DC

## 2014-06-19 MED ORDER — HYDROCODONE-ACETAMINOPHEN 5-325 MG PO TABS: 1.0000 | ORAL_TABLET | Freq: Three times a day (TID) | ORAL | Status: DC | PRN

## 2014-06-19 MED ORDER — METOPROLOL SUCCINATE ER 50 MG PO TB24: 50.0000 mg | ORAL_TABLET | Freq: Every day | ORAL | Status: DC

## 2014-06-19 MED ORDER — TORSEMIDE 20 MG PO TABS: 20.0000 mg | ORAL_TABLET | ORAL | Status: DC

## 2014-06-19 MED ORDER — HYDROCODONE-ACETAMINOPHEN 5-325 MG PO TABS
ORAL_TABLET | ORAL | Status: DC
Start: 2014-06-19 — End: 2014-06-19

## 2014-06-19 NOTE — Progress Notes (Signed)
   Subjective:    Patient ID: Kelsey Sandoval, female    DOB: 1933/07/10, 80 y.o.   MRN: [id number]  HPILeft knee pain for the past month. Pt saw Dr. Luna Glasgow and had xray. Pt states he told her she would need a knee replacement.   Saw kidney specialist. They want to do a kidney biopsy.   Discuss citalopram. Pt was told to stop because of heart meds. Wants to know if she should go back on or try a different med.   Needs refill on hydrocodone, amlodipine, torsemide, metoprolol, lisinopril.  Requesting flu vaccine today.  Review of Systems  Constitutional: Negative for activity change, appetite change and fatigue.  Respiratory: Negative for cough and shortness of breath.   Cardiovascular: Positive for leg swelling. Negative for chest pain and palpitations.  Gastrointestinal: Negative for abdominal pain and abdominal distention.  Musculoskeletal: Positive for arthralgias, back pain, joint swelling and myalgias.  Neurological: Negative for headaches.  Psychiatric/Behavioral: Negative for behavioral problems.       Objective:   Physical Exam  Vitals reviewed. Constitutional: She appears well-nourished. No distress.  Cardiovascular: Normal rate and normal heart sounds.   No murmur heard. Pulmonary/Chest: Effort normal and breath sounds normal. No respiratory distress.  Musculoskeletal: She exhibits no edema.  Lymphadenopathy:    She has no cervical adenopathy.  Neurological: She is alert. She exhibits normal muscle tone.  Psychiatric: Her behavior is normal.          Assessment & Plan:  1. Need for prophylactic vaccination and inoculation against influenza Immunization given today  2. Primary osteoarthritis of left knee This patient eventually will need a left knee replacement but I recommend continuing with current measures. Patient cannot use anti-inflammatories will use pain medication. Up to 4 times daily. We need to see her back in 3 months time.  3. Nephrotic  syndrome Patient should get kidney biopsy. She states that she is willing to do this in October. We will connect with her nephrologist.

## 2014-06-21 DIAGNOSIS — N049 Nephrotic syndrome with unspecified morphologic changes: Secondary | ICD-10-CM | POA: Insufficient documentation

## 2014-06-27 LAB — MDC_IDC_ENUM_SESS_TYPE_INCLINIC
Battery Voltage: 2.79 V
Brady Statistic AP VP Percent: 0.1 % — CL
Brady Statistic AP VS Percent: 22.76 %
Brady Statistic AS VP Percent: 0.2 %
Brady Statistic AS VS Percent: 77.1 %
Lead Channel Impedance Value: 551 Ohm
Lead Channel Pacing Threshold Amplitude: 1 V
Lead Channel Pacing Threshold Pulse Width: 0.4 ms
Lead Channel Sensing Intrinsic Amplitude: 1 mV
Lead Channel Sensing Intrinsic Amplitude: 11.2 mV
Lead Channel Setting Pacing Amplitude: 2.25 V
MDC IDC MSMT BATTERY IMPEDANCE: 156 Ohm
MDC IDC MSMT LEADCHNL RA IMPEDANCE VALUE: 445 Ohm
MDC IDC MSMT LEADCHNL RV PACING THRESHOLD AMPLITUDE: 0.75 V
MDC IDC MSMT LEADCHNL RV PACING THRESHOLD PULSEWIDTH: 0.4 ms
MDC IDC SET LEADCHNL RA PACING AMPLITUDE: 2.25 V
MDC IDC SET LEADCHNL RV PACING PULSEWIDTH: 0.4 ms
MDC IDC SET LEADCHNL RV SENSING SENSITIVITY: 5.6 mV

## 2014-07-02 ENCOUNTER — Encounter: Payer: Self-pay | Admitting: Cardiovascular Disease

## 2014-07-13 ENCOUNTER — Telehealth: Payer: Self-pay | Admitting: Family Medicine

## 2014-07-13 NOTE — Telephone Encounter (Signed)
#  1- call dr befakadu office to tell them the patient states she is now willing to do the renal biopsy #2 let pt know we spoke with Dr Juanetta Beets regarding this #3- have pt call Dr B office to set it up I highly recommend she do this

## 2014-07-13 NOTE — Telephone Encounter (Signed)
Pt states at last office visit she discussed with Dr. Nicki Reaper the need for a kidney biopsy and wanted to wait, pt is now ready for this to be done, please advise

## 2014-07-16 NOTE — Telephone Encounter (Signed)
Discussed with patient. Pt to call his office. Number given to pt.

## 2014-07-16 NOTE — Telephone Encounter (Signed)
LMRC

## 2014-07-25 ENCOUNTER — Other Ambulatory Visit (HOSPITAL_COMMUNITY): Payer: Self-pay | Admitting: Nephrology

## 2014-07-25 DIAGNOSIS — R809 Proteinuria, unspecified: Secondary | ICD-10-CM

## 2014-07-27 ENCOUNTER — Telehealth: Payer: Self-pay | Admitting: Cardiovascular Disease

## 2014-07-27 NOTE — Telephone Encounter (Signed)
Hold Pradaxa for 48 hours before. The Pradaxa kicks in immediately after restarting. Usually hold for 5-7 days afterwards, but depends on the physician performing the procedure and their assessment of bleeding risk.

## 2014-07-27 NOTE — Telephone Encounter (Signed)
Received a call from [place] at [facility] scheduling she wanted to ask Dr.Croitoru if ok for patient to hold pradaxa for upcoming ultrasound biopsy.Stated they needed to know when to hold pradaxa and when to restart.Stated fax permission note to her at fax # [phone].Message sent to Dr.Croitoru.

## 2014-07-27 NOTE — Telephone Encounter (Signed)
Message faxed to [place] at [facility] scheduling at fax # [phone].

## 2014-07-30 ENCOUNTER — Encounter (HOSPITAL_COMMUNITY): Payer: Self-pay | Admitting: Pharmacy Technician

## 2014-08-02 ENCOUNTER — Other Ambulatory Visit: Payer: Self-pay | Admitting: Radiology

## 2014-08-03 ENCOUNTER — Other Ambulatory Visit: Payer: Self-pay | Admitting: Radiology

## 2014-08-04 ENCOUNTER — Other Ambulatory Visit: Payer: Self-pay | Admitting: Radiology

## 2014-08-06 ENCOUNTER — Ambulatory Visit (HOSPITAL_COMMUNITY)
Admission: RE | Admit: 2014-08-06 | Discharge: 2014-08-06 | Disposition: A | Discharge status: Home / Self Care | Payer: Medicare Other | Source: Ambulatory Visit | Attending: Interventional Radiology | Admitting: Interventional Radiology

## 2014-08-06 ENCOUNTER — Encounter (HOSPITAL_COMMUNITY): Payer: Self-pay

## 2014-08-06 DIAGNOSIS — N189 Chronic kidney disease, unspecified: Secondary | ICD-10-CM | POA: Insufficient documentation

## 2014-08-06 DIAGNOSIS — I129 Hypertensive chronic kidney disease with stage 1 through stage 4 chronic kidney disease, or unspecified chronic kidney disease: Secondary | ICD-10-CM | POA: Insufficient documentation

## 2014-08-06 DIAGNOSIS — R809 Proteinuria, unspecified: Secondary | ICD-10-CM | POA: Diagnosis not present

## 2014-08-06 LAB — PROTIME-INR
INR: 1.06 (ref 0.00–1.49)
Prothrombin Time: 13.9 seconds (ref 11.6–15.2)

## 2014-08-06 LAB — CBC
HEMATOCRIT: 32.8 % — AB (ref 36.0–46.0)
Hemoglobin: 11.3 g/dL — ABNORMAL LOW (ref 12.0–15.0)
MCH: 30.2 pg (ref 26.0–34.0)
MCHC: 34.5 g/dL (ref 30.0–36.0)
MCV: 87.7 fL (ref 78.0–100.0)
Platelets: 230 10*3/uL (ref 150–400)
RBC: 3.74 MIL/uL — ABNORMAL LOW (ref 3.87–5.11)
RDW: 12.9 % (ref 11.5–15.5)
WBC: 7.1 10*3/uL (ref 4.0–10.5)

## 2014-08-06 LAB — APTT: aPTT: 31 seconds (ref 24–37)

## 2014-08-06 MED ORDER — FENTANYL CITRATE 0.05 MG/ML IJ SOLN
INTRAMUSCULAR | Status: AC
  Filled 2014-08-06: qty 2

## 2014-08-06 MED ORDER — MIDAZOLAM HCL 2 MG/2ML IJ SOLN
INTRAMUSCULAR | Status: AC | PRN
  Administered 2014-08-06: 1 mg via INTRAVENOUS

## 2014-08-06 MED ORDER — LIDOCAINE HCL (PF) 1 % IJ SOLN
INTRAMUSCULAR | Status: AC
  Filled 2014-08-06: qty 5

## 2014-08-06 MED ORDER — MIDAZOLAM HCL 2 MG/2ML IJ SOLN
INTRAMUSCULAR | Status: AC
  Filled 2014-08-06: qty 4

## 2014-08-06 MED ORDER — SODIUM CHLORIDE 0.9 % IV SOLN: Freq: Once | INTRAVENOUS | Status: DC

## 2014-08-06 MED ORDER — FENTANYL CITRATE 0.05 MG/ML IJ SOLN
INTRAMUSCULAR | Status: AC | PRN
  Administered 2014-08-06: 50 ug via INTRAVENOUS

## 2014-08-06 NOTE — Discharge Instructions (Signed)
Biopsy Care After Refer to this sheet in the next few weeks. These instructions provide you with information on caring for yourself after your procedure. Your caregiver may also give you more specific instructions. Your treatment has been planned according to current medical practices, but problems sometimes occur. Call your caregiver if you have any problems or questions after your procedure. If you had a fine needle biopsy, you may have soreness at the biopsy site for 1 to 2 days. If you had an open biopsy, you may have soreness at the biopsy site for 3 to 4 days. HOME CARE INSTRUCTIONS   You may resume normal diet and activities as directed.  Change bandages (dressings) as directed. If your wound was closed with a skin glue (adhesive), it will wear off and begin to peel in 7 days.  Only take over-the-counter or prescription medicines for pain, discomfort, or fever as directed by your caregiver.  Ask your caregiver when you can bathe and get your wound wet. SEEK IMMEDIATE MEDICAL CARE IF:   You have increased bleeding (more than a small spot) from the biopsy site.  You notice redness, swelling, or increasing pain at the biopsy site.  You have pus coming from the biopsy site.  You have a fever.  You notice a bad smell coming from the biopsy site or dressing.  You have a rash, have difficulty breathing, or have any allergic problems. MAKE SURE YOU:   Understand these instructions.  Will watch your condition.  Will get help right away if you are not doing well or get worse. Document Released: 04/10/2005 Document Revised: 12/14/2011 Document Reviewed: 03/19/2011 [facility] Patient Information 2015 [place], [place]. This information is not intended to replace advice given to you by your health care provider. Make sure you discuss any questions you have with your health care provider.   Kidney Biopsy, Care After Refer to this sheet in the next few weeks. These instructions provide you  with information on caring for yourself after your procedure. Your health care provider may also give you more specific instructions. Your treatment has been planned according to current medical practices, but problems sometimes occur. Call your health care provider if you have any problems or questions after your procedure.  WHAT TO EXPECT AFTER THE PROCEDURE   You may notice blood in the urine for the first 24 hours after the biopsy.  You may feel some pain at the biopsy site for 1-2 weeks after the biopsy. HOME CARE INSTRUCTIONS  Do not lift anything heavier than 10 lb (4.5 kg) for 2 weeks.  Do not take any non-steroidal anti-inflammatory drugs (NSAIDs) or any blood thinners for a week after the biopsy unless instructed to do so by your health care provider.  Only take medicines for pain, fever, or discomfort as directed by your health care provider. SEEK MEDICAL CARE IF:  You have bloody urine more than 24 hours after the biopsy.   You develop a fever.   You cannot urinate.   You have increasing pain at the biopsy site.  SEEK IMMEDIATE MEDICAL CARE IF: You feel faint or dizzy.  Document Released: 05/24/2013 Document Reviewed: 05/24/2013 [facility] Patient Information 2015 [place]. This information is not intended to replace advice given to you by your health care provider. Make sure you discuss any questions you have with your health care provider.

## 2014-08-06 NOTE — Sedation Documentation (Signed)
Patient denies pain and is resting comfortably.  

## 2014-08-06 NOTE — Procedures (Signed)
Successful LT KIDNEY CORE BX NO COMP STABLE FULL REPORT IN PACS

## 2014-08-06 NOTE — H&P (Signed)
Chief Complaint: Proteinuria  Referring Physician(s): Befekadu,Belayenh S  History of Present Illness: Kelsey Sandoval is a 80 y.o. female  Pt has had hx proteinuria in 1990s Known nephrotic syndrome Was "treated and followed" and did well. Noticed recent fatigue; leg and feet edema PMD referred to Dr Hinda Lenis Proteinuria noted Now scheduled for renal biopsy Off Pradaxa 2 days   Past Medical History  Diagnosis Date  . Hypertension   . Dysrhythmia   . Anxiety   . Depression   . Pacemaker   . Arthritis   . Nephrotic syndrome   . Hyperlipidemia   . Pre-diabetes   . Osteopenia   . Atrial fibrillation   . Back pain, chronic     Past Surgical History  Procedure Laterality Date  . Insert / replace / remove pacemaker    . Appendectomy    . Abdominal hysterectomy      partial-pt has no ovaries  . Back surgery    . Colonoscopy  9/05  . Dual chamber pacemaker      2012  . Colonoscopy N/A 10/11/2013    Procedure: COLONOSCOPY;  Surgeon: Rogene Houston, MD;  Location: [facility];  Service: Endoscopy;  Laterality: N/A;  1200  . Esophagogastroduodenoscopy N/A 01/17/2014    Procedure: ESOPHAGOGASTRODUODENOSCOPY (EGD);  Surgeon: Rogene Houston, MD;  Location: [facility];  Service: Endoscopy;  Laterality: N/A;  230  . Esophageal biopsy N/A 01/17/2014    Procedure: BIOPSY;  Surgeon: Rogene Houston, MD;  Location: [facility];  Service: Endoscopy;  Laterality: N/A;    Allergies: Penicillins; Levaquin; Sulfa antibiotics; and Zithromax  Medications: Prior to Admission medications   Medication Sig Start Date End Date Taking? Authorizing Provider  albuterol (PROVENTIL HFA;VENTOLIN HFA) 108 (90 BASE) MCG/ACT inhaler Inhale 2 puffs into the lungs every 6 (six) hours as needed for wheezing or shortness of breath.   Yes Historical Provider, MD  ALPRAZolam Duanne Moron) 0.5 MG tablet Take 0.5 mg by mouth at bedtime as needed for anxiety.   Yes Historical Provider, MD  amitriptyline  (ELAVIL) 50 MG tablet Take 50 mg by mouth at bedtime.   Yes Historical Provider, MD  amLODipine (NORVASC) 10 MG tablet Take 10 mg by mouth daily.   Yes Historical Provider, MD  dabigatran (PRADAXA) 150 MG CAPS capsule Take 150 mg by mouth 2 (two) times daily.   Yes Historical Provider, MD  flecainide (TAMBOCOR) 50 MG tablet Take 1 tablet (50 mg total) by mouth 2 (two) times daily. 06/07/14  Yes Pixie Casino, MD  HYDROcodone-acetaminophen (NORCO/VICODIN) 5-325 MG per tablet Take 1 tablet by mouth every 4 (four) hours as needed for moderate pain.   Yes Historical Provider, MD  lisinopril (PRINIVIL,ZESTRIL) 40 MG tablet Take 1 tablet (40 mg total) by mouth daily. 06/19/14  Yes Kathyrn Drown, MD  meloxicam (MOBIC) 15 MG tablet Take 15 mg by mouth daily.   Yes Historical Provider, MD  metoprolol succinate (TOPROL-XL) 100 MG 24 hr tablet Take 100 mg by mouth daily. Take with or immediately following a meal.   Yes Historical Provider, MD  pravastatin (PRAVACHOL) 80 MG tablet Take 1 tablet (80 mg total) by mouth daily. 02/23/14  Yes Kathyrn Drown, MD  vitamin B-12 (CYANOCOBALAMIN) 1000 MCG tablet Take 1,000 mcg by mouth 2 (two) times daily.   Yes Historical Provider, MD    Family History  Problem Relation Age of Onset  . Colon cancer Brother     History  Social History  . Marital Status: Divorced    Spouse Name: N/A    Number of Children: N/A  . Years of Education: N/A   Social History Main Topics  . Smoking status: Never Smoker   . Smokeless tobacco: None  . Alcohol Use: No  . Drug Use: No  . Sexual Activity: None   Other Topics Concern  . None   Social History Narrative    Review of Systems: A 12 point ROS discussed and pertinent positives are indicated in the HPI above.  All other systems are negative.  Review of Systems  Constitutional: Positive for fatigue. Negative for activity change and unexpected weight change.  Respiratory: Negative for cough and shortness of breath.     Cardiovascular: Negative for chest pain.  Gastrointestinal: Negative for nausea and vomiting.  Genitourinary: Negative for difficulty urinating.  Musculoskeletal: Positive for joint swelling and gait problem. Negative for back pain.  Skin: Negative for color change.  Neurological: Positive for weakness. Negative for dizziness and headaches.  Psychiatric/Behavioral: Negative for behavioral problems and confusion.    Vital Signs: BP 176/85 mmHg  Pulse 71  Temp(Src) 98.1 F (36.7 C) (Oral)  Resp 20  Ht 5\' 9"  (1.753 m)  Wt 72.576 kg (160 lb)  BMI 23.62 kg/m2  SpO2 100%  Physical Exam  Constitutional: She is oriented to person, place, and time. She appears well-nourished.  Cardiovascular: Normal rate and regular rhythm.   No murmur heard. Pulmonary/Chest: Effort normal and breath sounds normal. She has no wheezes.  Abdominal: Soft. Bowel sounds are normal.  Musculoskeletal: Normal range of motion. She exhibits edema.  Mild feet edema  Neurological: She is alert and oriented to person, place, and time.  Skin: Skin is warm and dry.  Psychiatric: She has a normal mood and affect. Her behavior is normal. Judgment and thought content normal.    Imaging: No results found.  Labs:  CBC:  Recent Labs  12/08/13 0926 02/07/14 0913 08/06/14 1248  WBC 6.2  --  7.1  HGB 10.9* 11.2* 11.3*  HCT 32.8* 32.8* 32.8*  PLT 281  --  230    COAGS:  Recent Labs  08/06/14 1248  INR 1.06  APTT 31    BMP:  Recent Labs  02/27/14 1008 03/01/14 1017  NA 137  --   K 4.6  --   CL 108  --   CO2 24  --   GLUCOSE 84  --   BUN 17  --   CALCIUM 8.4  --   CREATININE 1.24* 1.24*    LIVER FUNCTION TESTS:  Recent Labs  02/27/14 1008  BILITOT 0.3  AST 24  ALT 13  ALKPHOS 57  PROT 4.8*  ALBUMIN 2.9*    TUMOR MARKERS: No results for input(s): AFPTM, CEA, CA199, CHROMGRNA in the last 8760 hours.  Assessment and Plan:  Proteinuria Nephrotic syndrome Scheduled for random  renal biopsy Pt aware of procedure benefits and risks and agreeable to proceed Consent signed andin chart Off Pradaxa x 2 days  Thank you for this interesting consult.  I greatly enjoyed meeting Kelsey Sandoval and look forward to participating in their care.    I spent a total of 20 minutes face to face in clinical consultation, greater than 50% of which was counseling/coordinating care for random renal bx  Signed: Larin Depaoli A 08/06/2014, 2:03 PM

## 2014-08-09 ENCOUNTER — Other Ambulatory Visit: Payer: Self-pay | Admitting: Cardiovascular Disease

## 2014-08-10 ENCOUNTER — Ambulatory Visit (INDEPENDENT_AMBULATORY_CARE_PROVIDER_SITE_OTHER): Payer: Medicare Other | Admitting: Nurse Practitioner

## 2014-08-10 VITALS — Ht 69.0 in

## 2014-08-10 DIAGNOSIS — K589 Irritable bowel syndrome without diarrhea: Secondary | ICD-10-CM | POA: Diagnosis not present

## 2014-08-10 DIAGNOSIS — K219 Gastro-esophageal reflux disease without esophagitis: Secondary | ICD-10-CM | POA: Diagnosis not present

## 2014-08-10 MED ORDER — OMEPRAZOLE 20 MG PO CPDR: 20.0000 mg | DELAYED_RELEASE_CAPSULE | Freq: Two times a day (BID) | ORAL | Status: DC

## 2014-08-10 NOTE — Patient Instructions (Signed)
Irritable Bowel Syndrome Irritable bowel syndrome (IBS) is caused by a disturbance of normal bowel function and is a common digestive disorder. You may also hear this condition called spastic colon, mucous colitis, and irritable colon. There is no cure for IBS. However, symptoms often gradually improve or disappear with a good diet, stress management, and medicine. This condition usually appears in late adolescence or early adulthood. Women develop it twice as often as men. CAUSES  After food has been digested and absorbed in the small intestine, waste material is moved into the large intestine, or colon. In the colon, water and salts are absorbed from the undigested products coming from the small intestine. The remaining residue, or fecal material, is held for elimination. Under normal circumstances, gentle, rhythmic contractions of the bowel walls push the fecal material along the colon toward the rectum. In IBS, however, these contractions are irregular and poorly coordinated. The fecal material is either retained too long, resulting in constipation, or expelled too soon, producing diarrhea. SIGNS AND SYMPTOMS  The most common symptom of IBS is abdominal pain. It is often in the lower left side of the abdomen, but it may occur anywhere in the abdomen. The pain comes from spasms of the bowel muscles happening too much and from the buildup of gas and fecal material in the colon. This pain:  Can range from sharp abdominal cramps to a dull, continuous ache.  Often worsens soon after eating.  Is often relieved by having a bowel movement or passing gas. Abdominal pain is usually accompanied by constipation, but it may also produce diarrhea. The diarrhea often occurs right after a meal or upon waking up in the morning. The stools are often soft, watery, and flecked with mucus. Other symptoms of IBS include:  Bloating.  Loss of appetite.  Heartburn.  Backache.  Dull pain in the arms or  shoulders.  Nausea.  Burping.  Vomiting.  Gas. IBS may also cause symptoms that are unrelated to the digestive system, such as:  Fatigue.  Headaches.  Anxiety.  Shortness of breath.  Trouble concentrating.  Dizziness. These symptoms tend to come and go. DIAGNOSIS  The symptoms of IBS may seem like symptoms of other, more serious digestive disorders. Your health care provider may want to perform tests to exclude these disorders.  TREATMENT Many medicines are available to help correct bowel function or relieve bowel spasms and abdominal pain. Among the medicines available are:  Laxatives for severe constipation and to help restore normal bowel habits.  Specific antidiarrheal medicines to treat severe or lasting diarrhea.  Antispasmodic agents to relieve intestinal cramps. Your health care provider may also decide to treat you with a mild tranquilizer or sedative during unusually stressful periods in your life. Your health care provider may also prescribe antidepressant medicine. The use of this medicine has been shown to reduce pain and other symptoms of IBS. Remember that if any medicine is prescribed for you, you should take it exactly as directed. Make sure your health care provider knows how well it worked for you. HOME CARE INSTRUCTIONS   Take all medicines as directed by your health care provider.  Avoid foods that are high in fat or oils, such as heavy cream, butter, frankfurters, sausage, and other fatty meats.  Avoid foods that make you go to the bathroom, such as fruit, fruit juice, and dairy products.  Cut out carbonated drinks, chewing gum, and "gassy" foods such as beans and cabbage. This may help relieve bloating and burping.    Eat foods with bran, and drink plenty of liquids with the bran foods. This helps relieve constipation.  Keep track of what foods seem to bring on your symptoms.  Avoid emotionally charged situations or circumstances that produce  anxiety.  Start or continue exercising.  Get plenty of rest and sleep. Document Released: 09/21/2005 Document Revised: 09/26/2013 Document Reviewed: 05/11/2008 [employer] Patient Information 2015 [employer]. This information is not intended to replace advice given to you by your health care provider. Make sure you discuss any questions you have with your health care provider.  

## 2014-08-16 ENCOUNTER — Encounter: Payer: Self-pay | Admitting: Nurse Practitioner

## 2014-08-16 NOTE — Progress Notes (Signed)
Subjective:  Presents with complaints of nausea off and on for the past 2-3 months, occurs about every 2 weeks. Had some vomiting about 3 AM last week. Having rumbling sensation in her abdomen worse  Around the time she is eating. Better after a meal. No fever. Slightly decreased appetite. Drinking 1 can of ensure every night. Had a normal EGD on 01/17/2014 with Dr. Laural Golden. Sometimes eats a late snack which flares up her reflux. Seems to be worse when she eats lettuce. Alternating cycles of diarrhea and constipation. No blood in her stool. Stools normal color. Colonoscopy is up-to-date.  Objective:   Ht 5\' 9"  (1.753 m) NAD. Alert, oriented. Lungs clear. Heart regular rate rhythm. Abdomen soft nondistended nontender with active bowel sounds.  Assessment: Irritable bowel syndrome  Gastroesophageal reflux disease without esophagitis  Plan: Recommend daily OTC probiotic for bowels. Given written and verbal information on IBS. Encouraged patient to avoid eating within 3 hours of going to bed since this will increase her risk of reflux. Recommend follow-up with her GI specialist in 2-3 weeks if symptoms do not improve. Call back sooner if needed.

## 2014-08-20 ENCOUNTER — Encounter (HOSPITAL_COMMUNITY): Payer: Self-pay

## 2014-08-22 ENCOUNTER — Encounter (HOSPITAL_COMMUNITY): Payer: Self-pay

## 2014-08-28 DIAGNOSIS — I10 Essential (primary) hypertension: Secondary | ICD-10-CM | POA: Diagnosis not present

## 2014-08-28 DIAGNOSIS — N041 Nephrotic syndrome with focal and segmental glomerular lesions: Secondary | ICD-10-CM | POA: Diagnosis not present

## 2014-08-28 DIAGNOSIS — N183 Chronic kidney disease, stage 3 (moderate): Secondary | ICD-10-CM | POA: Diagnosis not present

## 2014-08-29 ENCOUNTER — Telehealth: Payer: Self-pay | Admitting: Cardiovascular Disease

## 2014-08-29 NOTE — Telephone Encounter (Signed)
Close encounter 

## 2014-09-07 ENCOUNTER — Encounter: Payer: Medicare Other | Admitting: *Deleted

## 2014-09-07 ENCOUNTER — Telehealth: Payer: Self-pay | Admitting: Cardiology

## 2014-09-07 NOTE — Telephone Encounter (Signed)
LMOVM reminding pt to send remote transmission.   

## 2014-09-08 ENCOUNTER — Other Ambulatory Visit: Payer: Self-pay | Admitting: Family Medicine

## 2014-09-10 ENCOUNTER — Encounter: Payer: Self-pay | Admitting: Cardiology

## 2014-09-17 ENCOUNTER — Ambulatory Visit (INDEPENDENT_AMBULATORY_CARE_PROVIDER_SITE_OTHER): Payer: Medicare Other | Admitting: Family Medicine

## 2014-09-17 ENCOUNTER — Encounter: Payer: Self-pay | Admitting: Family Medicine

## 2014-09-17 VITALS — BP 156/98 | Ht 69.0 in | Wt 157.0 lb

## 2014-09-17 DIAGNOSIS — Z79899 Other long term (current) drug therapy: Secondary | ICD-10-CM

## 2014-09-17 DIAGNOSIS — M1711 Unilateral primary osteoarthritis, right knee: Secondary | ICD-10-CM

## 2014-09-17 DIAGNOSIS — D649 Anemia, unspecified: Secondary | ICD-10-CM

## 2014-09-17 DIAGNOSIS — M179 Osteoarthritis of knee, unspecified: Secondary | ICD-10-CM | POA: Diagnosis not present

## 2014-09-17 DIAGNOSIS — E785 Hyperlipidemia, unspecified: Secondary | ICD-10-CM | POA: Diagnosis not present

## 2014-09-17 DIAGNOSIS — N059 Unspecified nephritic syndrome with unspecified morphologic changes: Secondary | ICD-10-CM | POA: Diagnosis not present

## 2014-09-17 MED ORDER — CITALOPRAM HYDROBROMIDE 20 MG PO TABS: 20.0000 mg | ORAL_TABLET | Freq: Every day | ORAL | Status: DC

## 2014-09-17 MED ORDER — HYDROCODONE-ACETAMINOPHEN 5-325 MG PO TABS: 1.0000 | ORAL_TABLET | ORAL | Status: DC | PRN

## 2014-09-17 MED ORDER — ALPRAZOLAM 1 MG PO TABS: 1.0000 mg | ORAL_TABLET | Freq: Two times a day (BID) | ORAL | Status: DC | PRN

## 2014-09-17 MED ORDER — ALBUTEROL SULFATE HFA 108 (90 BASE) MCG/ACT IN AERS: 2.0000 | INHALATION_SPRAY | Freq: Four times a day (QID) | RESPIRATORY_TRACT | Status: DC | PRN

## 2014-09-17 NOTE — Progress Notes (Signed)
   Subjective:    Patient ID: Kelsey Sandoval, female    DOB: 07/23/1933, 79 y.o.   MRN: [id number]  HPI Patient is here today for a follow up on her kidneys. Very complex issues discussed with the patient today 40 minutes spent with patient covering all these issues.  She went to the kidney doctor and they dx her with nephritis. A biopsy showed nephritis with glomerular disease. They recommended prednisone and cyclosporine. They are having questions whether or not they should take this medicine. They were told by the nephrologist to get a second opinion before starting medications.  This patient has cardiac disease with atrial fibrillation is on Pradaxa. Not having any bleeding issues currently.   Daughter - Kelsey Sandoval has questions about meds they want to put her on. Prednisone and cyclosporine.   Also questions about Celexa b/c she is struggling with depression. Having a fair amount of depression symptoms feeling stressed tearful sad. Used to be on Celexa helped but Celexa does not get along with amitriptyline. So therefore they're hoping to be put back on Celexa.  Pain meds refilled. Patient uses pain medications to help her with her severe osteoarthritis of her knees. She does not abuse medicine. It does help her function. She has contemplated having surgery on her knee but she just really doesn't know if it would be helpful. She would like to see a different orthopedist for a second opinion.  Needs refill on Elavil. She states this does help her with sleeping.  Wants a handicap placard. She relates she needs this because she has difficult time walking and significant fatigue  Kidney doctor wants her to get 2nd opinion from another nephrologist.   Past medical history reviewed  Review of Systems Patient denies chest tightness denies dysphagia no headaches she relates tearfulness sadness not suicidal she denies chest pressure tightness shortness of breath she does relate back pain she also  relates bilateral knee pain she also relates pedal edema she does state mild dyspnea with activity. Denies any bleeding difficulties    Objective:   Physical Exam Eardrums normal throat is normal neck is supple lungs are clear no crackles heart irregular rate is controlled abdomen is soft extremities 1+ edema neurologic grossly normal severe osteoarthritis in the knees noted subjected discomfort in her back neurologic grossly normal psychiatric behavior normal       Assessment & Plan:  #1 depression restart Celexa 20 mg daily stop elevated #2 insomnia-1 mg at nighttime as necessary in the past she is used 0.5 she'll need a stronger tablet during the day she is only to use half tablet if necessary for anxiety issues #3 osteoarthritis referral to orthopedics currently right now she uses hydrocodone for pain she was told no anti-inflammatories because of her blood thinner she might benefit from surgery but if she is gravida do surgery she needs to do it before getting started on prednisone and cyclosporine #4 nephritis/glomerular disease-get a second opinion from [facility] nephrology. May need to go on cyclosporine and prednisone #5 history of anemia check CBC #6 hyperlipidemia check lipid liver profile #7 intermittent reactive airway albuterol when necessary #8 atrial fibrillation under decent control continue blood thinner stop Mobic  40 minutes spent with patient

## 2014-09-18 ENCOUNTER — Encounter: Payer: Self-pay | Admitting: Physician Assistant

## 2014-09-18 ENCOUNTER — Ambulatory Visit (INDEPENDENT_AMBULATORY_CARE_PROVIDER_SITE_OTHER): Payer: Medicare Other | Admitting: Physician Assistant

## 2014-09-18 VITALS — BP 147/75 | HR 81 | Ht 69.0 in | Wt 162.9 lb

## 2014-09-18 DIAGNOSIS — I495 Sick sinus syndrome: Secondary | ICD-10-CM

## 2014-09-18 DIAGNOSIS — I10 Essential (primary) hypertension: Secondary | ICD-10-CM | POA: Diagnosis not present

## 2014-09-18 DIAGNOSIS — Z950 Presence of cardiac pacemaker: Secondary | ICD-10-CM | POA: Diagnosis not present

## 2014-09-18 DIAGNOSIS — I480 Paroxysmal atrial fibrillation: Secondary | ICD-10-CM | POA: Diagnosis not present

## 2014-09-18 NOTE — Assessment & Plan Note (Signed)
Mildly abnormal elevated today.  I'm not going to make any changes.

## 2014-09-18 NOTE — Progress Notes (Signed)
Patient ID: Kelsey Sandoval, female   DOB: 1933-05-12, 80 y.o.   MRN: [id number]    Date:  09/18/2014   ID:  Kelsey Sandoval, DOB 02-23-1933, MRN [id number]  PCP:  Kelsey Lange, MD  Primary Cardiologist:  Croitoru    History of Present Illness: Kelsey Sandoval is a 80 y.o. female with a history of tachybradycardia syndrome, [employer] Adapta dual-chamber pacemaker implant August 2012, paroxysmal atrial fibrillation, hypertension, mitral insufficiency, nephrotic syndrome, dyslipidemia.   She is currently doing well, but is apprehensive about possible need for left total knee replacement. She is wearing a brace. She does not it made her mind up whether she wants to go through with surgery. Earlier this year she fell and had a right radial Colles fracture  Patient was last seen by Dr.  Sallyanne Kuster on June 07, 2014 at which time she had not had any bleeding problems on a chronic anticoagulation, she has not had any symptoms of stroke/TIA or other embolic events. Her rhythm was atrial paced ventricular sensed. Interrogation of her pacemaker showed a very low burden of atrial arrhythmia (less than 0.1%). She had 3 episodes of high ventricular rates which were all atrial tachycardia with one-to-one conduction, the longest episode being only 9 seconds in duration. She has roughly 23% atrial pacing and virtually no ventricular pacing.  Patient presents today with a few questions. She was diagnosed recently with a Nephrotic syndrome-membranous glomerulopathy.  Her nephrologist wants to start her on Cyclosporin and prednisone and she wanted to know if there are any concerns with her cardiac meds.  She feels fine but does have some LEE.  The patient currently denies nausea, vomiting, fever, chest pain, shortness of breath, orthopnea, dizziness, PND, cough, congestion, abdominal pain, hematochezia, melena..   Wt Readings from Last 3 Encounters:  09/18/14 162 lb 14.4 oz (73.891 kg)  09/17/14 157 lb (71.215 kg)    08/06/14 160 lb (72.576 kg)     Past Medical History  Diagnosis Date  . Hypertension   . Dysrhythmia   . Anxiety   . Depression   . Pacemaker   . Arthritis   . Nephrotic syndrome   . Hyperlipidemia   . Pre-diabetes   . Osteopenia   . Atrial fibrillation   . Back pain, chronic     Current Outpatient Prescriptions  Medication Sig Dispense Refill  . albuterol (PROVENTIL HFA;VENTOLIN HFA) 108 (90 BASE) MCG/ACT inhaler Inhale 2 puffs into the lungs every 6 (six) hours as needed for wheezing or shortness of breath. 18 g 5  . ALPRAZolam (XANAX) 1 MG tablet Take 1 tablet (1 mg total) by mouth 2 (two) times daily as needed for anxiety. 60 tablet 5  . amLODipine (NORVASC) 10 MG tablet Take 10 mg by mouth daily.    . citalopram (CELEXA) 20 MG tablet Take 1 tablet (20 mg total) by mouth daily. 30 tablet 5  . dabigatran (PRADAXA) 150 MG CAPS capsule Take 150 mg by mouth 2 (two) times daily.    . flecainide (TAMBOCOR) 50 MG tablet Take 1 tablet (50 mg total) by mouth 2 (two) times daily. 180 tablet 3  . HYDROcodone-acetaminophen (NORCO/VICODIN) 5-325 MG per tablet Take 1 tablet by mouth every 4 (four) hours as needed for moderate pain. 120 tablet 0  . lisinopril (PRINIVIL,ZESTRIL) 40 MG tablet Take 1 tablet (40 mg total) by mouth daily. 30 tablet 5  . metoprolol succinate (TOPROL-XL) 100 MG 24 hr tablet Take 100 mg by mouth daily. Take with or  immediately following a meal.    . omeprazole (PRILOSEC) 20 MG capsule Take 1 capsule (20 mg total) by mouth 2 (two) times daily before a meal. 60 capsule 2  . PRADAXA 150 MG CAPS capsule TAKE ONE CAPSULE BY MOUTH TWICE DAILY 60 capsule 5  . pravastatin (PRAVACHOL) 80 MG tablet Take 1 tablet (80 mg total) by mouth daily. 30 tablet 12  . vitamin B-12 (CYANOCOBALAMIN) 1000 MCG tablet Take 1,000 mcg by mouth 2 (two) times daily.     No current facility-administered medications for this visit.    Allergies:    Allergies  Allergen Reactions  .  Penicillins Other (See Comments)    Caused patient to pass out.  . Levaquin [Levofloxacin] Nausea Only  . Sulfa Antibiotics Other (See Comments)    Unknown  . Zithromax [Azithromycin] Nausea Only and Rash    Social History:  The patient  reports that she has never smoked. She does not have any smokeless tobacco history on file. She reports that she does not drink alcohol or use illicit drugs.   Family history:   Family History  Problem Relation Age of Onset  . Colon cancer Brother     ROS:  Please see the history of present illness.  All other systems reviewed and negative.   PHYSICAL EXAM: VS:  BP 147/75 mmHg  Pulse 81  Ht 5\' 9"  (1.753 m)  Wt 162 lb 14.4 oz (73.891 kg)  BMI 24.05 kg/m2 Well nourished, well developed, in no acute distress HEENT: Pupils are equal round react to light accommodation extraocular movements are intact.  Neck:No cervical lymphadenopathy. Cardiac: Regular rate and rhythm without murmurs rubs or gallops. Lungs:  clear to auscultation bilaterally, no wheezing, rhonchi or rales Ext: 1+ lower extremity edema.  2+ radial pulses. Skin: warm and dry Neuro:  Grossly normal     ASSESSMENT AND PLAN:  Problem List Items Addressed This Visit    Pacemaker    Patient's daughter reports she's having problems with the downloading equipment. They will call back if they can't resolve on their own.    Paroxysmal atrial fibrillation - Primary    Patient is currently taking Pradaxa. The cyclosporine at her nephrologist wants her to start on does have side effects of GI bleeding and severe hypertension.  I think she'll need to be monitored regularly for both of these with CBC and regular blood pressure checks.  Will discuss with Dr. Sallyanne Kuster    Systemic hypertension    Mildly abnormal elevated today.  I'm not going to make any changes.    Tachycardia-bradycardia syndrome

## 2014-09-18 NOTE — Patient Instructions (Signed)
Follow up with Dr.Croitoru as previously instructed

## 2014-09-18 NOTE — Assessment & Plan Note (Signed)
Patient's daughter reports she's having problems with the downloading equipment. They will call back if they can't resolve on their own.

## 2014-09-18 NOTE — Assessment & Plan Note (Signed)
Patient is currently taking Pradaxa. The cyclosporine at her nephrologist wants her to start on does have side effects of GI bleeding and severe hypertension.  I think she'll need to be monitored regularly for both of these with CBC and regular blood pressure checks.  Will discuss with Dr. Sallyanne Kuster

## 2014-10-01 ENCOUNTER — Telehealth: Payer: Self-pay | Admitting: Family Medicine

## 2014-10-01 NOTE — Telephone Encounter (Signed)
Calling to check on referral to nephrologist.

## 2014-10-03 NOTE — Telephone Encounter (Signed)
Spoke with patient, she will get labs done so I may send to [facility] with OV notes for referral

## 2014-10-08 DIAGNOSIS — Z79899 Other long term (current) drug therapy: Secondary | ICD-10-CM | POA: Diagnosis not present

## 2014-10-08 DIAGNOSIS — R809 Proteinuria, unspecified: Secondary | ICD-10-CM | POA: Diagnosis not present

## 2014-10-08 DIAGNOSIS — I10 Essential (primary) hypertension: Secondary | ICD-10-CM | POA: Diagnosis not present

## 2014-10-08 DIAGNOSIS — N183 Chronic kidney disease, stage 3 (moderate): Secondary | ICD-10-CM | POA: Diagnosis not present

## 2014-10-08 DIAGNOSIS — D649 Anemia, unspecified: Secondary | ICD-10-CM | POA: Diagnosis not present

## 2014-10-08 DIAGNOSIS — E785 Hyperlipidemia, unspecified: Secondary | ICD-10-CM | POA: Diagnosis not present

## 2014-10-09 LAB — HEPATIC FUNCTION PANEL
ALBUMIN: 3.1 g/dL — AB (ref 3.5–5.2)
ALT: 13 U/L (ref 0–35)
AST: 23 U/L (ref 0–37)
Alkaline Phosphatase: 63 U/L (ref 39–117)
Bilirubin, Direct: 0.1 mg/dL (ref 0.0–0.3)
Indirect Bilirubin: 0.2 mg/dL (ref 0.2–1.2)
TOTAL PROTEIN: 5.1 g/dL — AB (ref 6.0–8.3)
Total Bilirubin: 0.3 mg/dL (ref 0.2–1.2)

## 2014-10-09 LAB — CBC WITH DIFFERENTIAL/PLATELET
BASOS ABS: 0 10*3/uL (ref 0.0–0.1)
BASOS PCT: 0 % (ref 0–1)
EOS ABS: 0.1 10*3/uL (ref 0.0–0.7)
Eosinophils Relative: 1 % (ref 0–5)
HCT: 33.8 % — ABNORMAL LOW (ref 36.0–46.0)
Hemoglobin: 11.1 g/dL — ABNORMAL LOW (ref 12.0–15.0)
LYMPHS PCT: 33 % (ref 12–46)
Lymphs Abs: 2.1 10*3/uL (ref 0.7–4.0)
MCH: 30.3 pg (ref 26.0–34.0)
MCHC: 32.8 g/dL (ref 30.0–36.0)
MCV: 92.3 fL (ref 78.0–100.0)
MONO ABS: 0.5 10*3/uL (ref 0.1–1.0)
MPV: 10 fL (ref 9.4–12.4)
Monocytes Relative: 7 % (ref 3–12)
NEUTROS ABS: 3.8 10*3/uL (ref 1.7–7.7)
Neutrophils Relative %: 59 % (ref 43–77)
Platelets: 256 10*3/uL (ref 150–400)
RBC: 3.66 MIL/uL — AB (ref 3.87–5.11)
RDW: 13.1 % (ref 11.5–15.5)
WBC: 6.5 10*3/uL (ref 4.0–10.5)

## 2014-10-09 LAB — LIPID PANEL
Cholesterol: 145 mg/dL (ref 0–200)
HDL: 40 mg/dL (ref >39)
LDL Cholesterol: 58 mg/dL (ref 0–99)
TRIGLYCERIDES: 235 mg/dL — AB (ref <150)
Total CHOL/HDL Ratio: 3.6 Ratio
VLDL: 47 mg/dL — AB (ref 0–40)

## 2014-10-12 ENCOUNTER — Other Ambulatory Visit: Payer: Self-pay | Admitting: Family Medicine

## 2014-10-12 DIAGNOSIS — D508 Other iron deficiency anemias: Secondary | ICD-10-CM

## 2014-10-16 ENCOUNTER — Encounter: Payer: Self-pay | Admitting: *Deleted

## 2014-10-17 ENCOUNTER — Encounter: Payer: Self-pay | Admitting: Family Medicine

## 2014-10-17 ENCOUNTER — Ambulatory Visit (INDEPENDENT_AMBULATORY_CARE_PROVIDER_SITE_OTHER): Payer: Medicare Other | Admitting: Family Medicine

## 2014-10-17 VITALS — BP 122/82 | Temp 98.7°F | Wt 159.0 lb

## 2014-10-17 DIAGNOSIS — D509 Iron deficiency anemia, unspecified: Secondary | ICD-10-CM

## 2014-10-17 DIAGNOSIS — J019 Acute sinusitis, unspecified: Secondary | ICD-10-CM | POA: Diagnosis not present

## 2014-10-17 DIAGNOSIS — B9689 Other specified bacterial agents as the cause of diseases classified elsewhere: Secondary | ICD-10-CM

## 2014-10-17 MED ORDER — CEFPROZIL 500 MG PO TABS: 500.0000 mg | ORAL_TABLET | Freq: Two times a day (BID) | ORAL | Status: DC

## 2014-10-17 MED ORDER — HYDROCODONE-HOMATROPINE 5-1.5 MG/5ML PO SYRP: 5.0000 mL | ORAL_SOLUTION | Freq: Four times a day (QID) | ORAL | Status: DC | PRN

## 2014-10-17 NOTE — Progress Notes (Signed)
   Subjective:    Patient ID: Kelsey Sandoval, female    DOB: October 01, 1933, 81 y.o.   MRN: [id number]  Cough This is a new problem. Episode onset: Sunday. The problem has been gradually worsening. The cough is productive of sputum. Associated symptoms include nasal congestion and rhinorrhea. Associated symptoms comments: Fatigue, weakness. She does have a referral in and she did get BW done. I believe she is to go to [facility] for hematology? Marland Kitchen She has tried steroid inhaler and OTC cough suppressant for the symptoms.   Patient under a fair amount of stress because of her friend having some problems.  I talked at length about iron deficient anemia. She is up-to-date on colonoscopy we will do Hemoccult cards I recommend hematology referral I believe she may nap needing iron infusion. If her Hemoccult cards are positive she will need further workup. It is possible her nephrotic syndrome has partly to do with her anemia. She will be seen nephrologist in the near future. Our referral specialist is working with her on this.   Review of Systems  HENT: Positive for rhinorrhea.   Respiratory: Positive for cough.        Objective:   Physical Exam TMs NL moderate sinus tenderness throat normal neck supple lungs are clear not respiratory distress.       Assessment & Plan:  Acute sinusitis bacterial antibiotics prescribed Bronchial component patient to watch closely if rest or distress or worse follow-up no need for steroids currently  Patient under a lot of stress her gentleman friend has renal cancer and had surgery coming up  The patient herself has iron deficient anemia we will check Hemoccult cards patient had colonoscopy back in January 2015 this patient would benefit from seeing hematology probably will need iron infusion

## 2014-10-24 ENCOUNTER — Other Ambulatory Visit: Payer: Self-pay | Admitting: *Deleted

## 2014-10-24 DIAGNOSIS — D509 Iron deficiency anemia, unspecified: Secondary | ICD-10-CM

## 2014-10-24 LAB — POC HEMOCCULT BLD/STL (HOME/3-CARD/SCREEN)
Card #2 Fecal Occult Blod, POC: NEGATIVE
FECAL OCCULT BLD: NEGATIVE
FECAL OCCULT BLD: NEGATIVE

## 2014-11-06 ENCOUNTER — Encounter (HOSPITAL_COMMUNITY): Payer: Medicare Other | Attending: Hematology & Oncology | Admitting: Hematology & Oncology

## 2014-11-06 VITALS — BP 162/66 | HR 72 | Temp 98.1°F | Resp 18 | Ht 68.5 in | Wt 154.7 lb

## 2014-11-06 DIAGNOSIS — M2550 Pain in unspecified joint: Secondary | ICD-10-CM

## 2014-11-06 DIAGNOSIS — N183 Chronic kidney disease, stage 3 (moderate): Secondary | ICD-10-CM

## 2014-11-06 DIAGNOSIS — N052 Unspecified nephritic syndrome with diffuse membranous glomerulonephritis: Secondary | ICD-10-CM | POA: Diagnosis not present

## 2014-11-06 DIAGNOSIS — D509 Iron deficiency anemia, unspecified: Secondary | ICD-10-CM | POA: Diagnosis not present

## 2014-11-06 DIAGNOSIS — Z139 Encounter for screening, unspecified: Secondary | ICD-10-CM

## 2014-11-06 LAB — CBC WITH DIFFERENTIAL/PLATELET
BASOS ABS: 0 10*3/uL (ref 0.0–0.1)
BASOS PCT: 0 % (ref 0–1)
Eosinophils Absolute: 0 10*3/uL (ref 0.0–0.7)
Eosinophils Relative: 1 % (ref 0–5)
HEMATOCRIT: 32.3 % — AB (ref 36.0–46.0)
Hemoglobin: 10.7 g/dL — ABNORMAL LOW (ref 12.0–15.0)
LYMPHS PCT: 34 % (ref 12–46)
Lymphs Abs: 2.6 10*3/uL (ref 0.7–4.0)
MCH: 30.1 pg (ref 26.0–34.0)
MCHC: 33.1 g/dL (ref 30.0–36.0)
MCV: 91 fL (ref 78.0–100.0)
Monocytes Absolute: 0.5 10*3/uL (ref 0.1–1.0)
Monocytes Relative: 7 % (ref 3–12)
NEUTROS PCT: 58 % (ref 43–77)
Neutro Abs: 4.6 10*3/uL (ref 1.7–7.7)
Platelets: 229 10*3/uL (ref 150–400)
RBC: 3.55 MIL/uL — ABNORMAL LOW (ref 3.87–5.11)
RDW: 12.7 % (ref 11.5–15.5)
WBC: 7.9 10*3/uL (ref 4.0–10.5)

## 2014-11-06 LAB — COMPREHENSIVE METABOLIC PANEL
ALBUMIN: 3.2 g/dL — AB (ref 3.5–5.2)
ALT: 15 U/L (ref 0–35)
AST: 27 U/L (ref 0–37)
Alkaline Phosphatase: 58 U/L (ref 39–117)
Anion gap: 5 (ref 5–15)
BUN: 25 mg/dL — AB (ref 6–23)
CO2: 24 mmol/L (ref 19–32)
Calcium: 8.9 mg/dL (ref 8.4–10.5)
Chloride: 112 mmol/L (ref 96–112)
Creatinine, Ser: 1.54 mg/dL — ABNORMAL HIGH (ref 0.50–1.10)
GFR calc Af Amer: 36 mL/min — ABNORMAL LOW (ref >90)
GFR, EST NON AFRICAN AMERICAN: 31 mL/min — AB (ref >90)
GLUCOSE: 92 mg/dL (ref 70–99)
POTASSIUM: 4.1 mmol/L (ref 3.5–5.1)
Sodium: 141 mmol/L (ref 135–145)
TOTAL PROTEIN: 5.9 g/dL — AB (ref 6.0–8.3)
Total Bilirubin: 0.5 mg/dL (ref 0.3–1.2)

## 2014-11-06 LAB — IRON AND TIBC
Iron: 64 ug/dL (ref 42–145)
Saturation Ratios: 21 % (ref 20–55)
TIBC: 311 ug/dL (ref 250–470)
UIBC: 247 ug/dL (ref 125–400)

## 2014-11-06 NOTE — Patient Instructions (Signed)
[facility] Discharge Instructions  RECOMMENDATIONS MADE BY THE CONSULTANT AND ANY TEST RESULTS WILL BE SENT TO YOUR REFERRING PHYSICIAN.  SPECIAL INSTRUCTIONS/FOLLOW-UP:  Please call prior to your next visit with any problems or concerns. We will see you back again in 4 weeks with labs and an office visit. We will call you with the results of your bloodwork and arrange for iron replacement   Thank you for choosing [facility] to provide your oncology and hematology care.  To afford each patient quality time with our providers, please arrive at least 15 minutes before your scheduled appointment time.  With your help, our goal is to use those 15 minutes to complete the necessary work-up to ensure our physicians have the information they need to help with your evaluation and healthcare recommendations.    Effective January 1st, 2014, we ask that you re-schedule your appointment with our physicians should you arrive 10 or more minutes late for your appointment.  We strive to give you quality time with our providers, and arriving late affects you and other patients whose appointments are after yours.    Again, thank you for choosing [facility].  Our hope is that these requests will decrease the amount of time that you wait before being seen by our physicians.       _____________________________________________________________  Should you have questions after your visit to [facility], please contact our office at [phone] between the hours of 8:30 a.m. and 5:00 p.m.  Voicemails left after 4:30 p.m. will not be returned until the following business day.  For prescription refill requests, have your pharmacy contact our office with your prescription refill request.

## 2014-11-06 NOTE — Progress Notes (Signed)
[facility] CONSULT NOTE  Patient Care Team: Kathyrn Drown, MD as PCP - General (Family Medicine)  CHIEF COMPLAINTS/PURPOSE OF CONSULTATION:  Anemia  HISTORY OF PRESENTING ILLNESS:  Kelsey Sandoval 81 y.o. female is here because of anemia. The lowest hemoglobin seen on chart review was 10.9 g/dL on 12/08/2013. She has had Hemoccults of the stool which were negative. She has documented iron deficiency with a serum ferritin on 07/24/2013 of 26 ng/ml.  A B12 level in early 2015 was greater than 1000. A sedimentation rate in March 2015 was relatively unremarkable.  She has stage III chronic kidney disease based upon her GFR. She has membranous glomerulopathy based on kidney biopsy from November 2015.   She denies any bright red blood per rectum. She denies dark or tarry stool. She denies significant change in her baseline energy level. Colonoscopy was performed by Dr. Laural Golden in January 2015 that showed left sided diverticulosis. EGD was performed in April 2015 that showed a small sliding hiatal hernia, and nonerosive antral gastritis. Adrenal biopsies were negative for celiac disease.   MEDICAL HISTORY:  Past Medical History  Diagnosis Date  . Hypertension   . Dysrhythmia   . Anxiety   . Depression   . Pacemaker   . Arthritis   . Nephrotic syndrome   . Hyperlipidemia   . Pre-diabetes   . Osteopenia   . Atrial fibrillation   . Back pain, chronic     SURGICAL HISTORY: Past Surgical History  Procedure Laterality Date  . Insert / replace / remove pacemaker    . Appendectomy    . Abdominal hysterectomy      partial-pt has no ovaries  . Back surgery    . Colonoscopy  9/05  . Dual chamber pacemaker      2012  . Colonoscopy N/A 10/11/2013    Procedure: COLONOSCOPY;  Surgeon: Rogene Houston, MD;  Location: [facility];  Service: Endoscopy;  Laterality: N/A;  1200  . Esophagogastroduodenoscopy N/A 01/17/2014    Procedure: ESOPHAGOGASTRODUODENOSCOPY (EGD);  Surgeon:  Rogene Houston, MD;  Location: [facility];  Service: Endoscopy;  Laterality: N/A;  230  . Esophageal biopsy N/A 01/17/2014    Procedure: BIOPSY;  Surgeon: Rogene Houston, MD;  Location: [facility];  Service: Endoscopy;  Laterality: N/A;    SOCIAL HISTORY: History   Social History  . Marital Status: Divorced    Spouse Name: N/A  . Number of Children: N/A  . Years of Education: N/A   Occupational History  . Not on file.   Social History Main Topics  . Smoking status: Never Smoker   . Smokeless tobacco: Not on file  . Alcohol Use: No  . Drug Use: No  . Sexual Activity: Not on file   Other Topics Concern  . Not on file   Social History Narrative    FAMILY HISTORY: Family History  Problem Relation Age of Onset  . Colon cancer Brother    indicated that her mother is deceased. She indicated that her father is deceased. She indicated that her sister is alive. She indicated that only one of her two brothers is alive. She indicated that the status of her other is unknown.   ALLERGIES:  is allergic to penicillins; levaquin; sulfa antibiotics; and zithromax.  MEDICATIONS:  Current Outpatient Prescriptions  Medication Sig Dispense Refill  . albuterol (PROVENTIL HFA;VENTOLIN HFA) 108 (90 BASE) MCG/ACT inhaler Inhale 2 puffs into the lungs every 6 (six)  hours as needed for wheezing or shortness of breath. 18 g 5  . ALPRAZolam (XANAX) 1 MG tablet Take 1 tablet (1 mg total) by mouth 2 (two) times daily as needed for anxiety. 60 tablet 5  . amLODipine (NORVASC) 10 MG tablet Take 10 mg by mouth daily.    . cefPROZIL (CEFZIL) 500 MG tablet Take 1 tablet (500 mg total) by mouth 2 (two) times daily. 20 tablet 0  . citalopram (CELEXA) 20 MG tablet Take 1 tablet (20 mg total) by mouth daily. 30 tablet 5  . dabigatran (PRADAXA) 150 MG CAPS capsule Take 150 mg by mouth 2 (two) times daily.    . flecainide (TAMBOCOR) 50 MG tablet Take 1 tablet (50 mg total) by mouth 2 (two) times daily.  180 tablet 3  . HYDROcodone-acetaminophen (NORCO/VICODIN) 5-325 MG per tablet Take 1 tablet by mouth every 4 (four) hours as needed for moderate pain. 120 tablet 0  . lisinopril (PRINIVIL,ZESTRIL) 40 MG tablet Take 1 tablet (40 mg total) by mouth daily. 30 tablet 5  . metoprolol succinate (TOPROL-XL) 100 MG 24 hr tablet Take 100 mg by mouth daily. Take with or immediately following a meal.    . omeprazole (PRILOSEC) 20 MG capsule Take 1 capsule (20 mg total) by mouth 2 (two) times daily before a meal. 60 capsule 2  . pravastatin (PRAVACHOL) 80 MG tablet Take 1 tablet (80 mg total) by mouth daily. 30 tablet 12  . vitamin B-12 (CYANOCOBALAMIN) 1000 MCG tablet Take 1,000 mcg by mouth 2 (two) times daily.     No current facility-administered medications for this visit.    Review of Systems  Constitutional: Positive for malaise/fatigue. Negative for fever, chills and weight loss.  HENT: Negative for congestion, hearing loss, nosebleeds, sore throat and tinnitus.   Eyes: Negative for blurred vision, double vision, pain and discharge.  Respiratory: Negative for cough, hemoptysis, sputum production, shortness of breath and wheezing.   Cardiovascular: Negative for chest pain, palpitations, claudication, leg swelling and PND.  Gastrointestinal: Negative for heartburn, nausea, vomiting, abdominal pain, diarrhea, constipation, blood in stool and melena.  Genitourinary: Negative for dysuria, urgency, frequency and hematuria.  Musculoskeletal: Positive for joint pain. Negative for myalgias and falls.  Skin: Negative for itching and rash.  Neurological: Negative for dizziness, tingling, tremors, sensory change, speech change, focal weakness, seizures, loss of consciousness, weakness and headaches.  Endo/Heme/Allergies: Does not bruise/bleed easily.  Psychiatric/Behavioral: Negative for depression, suicidal ideas, memory loss and substance abuse. The patient is not nervous/anxious and does not have insomnia.      PHYSICAL EXAMINATION: ECOG PERFORMANCE STATUS: 0 - Asymptomatic  Filed Vitals:   11/06/14 1504  BP: 162/66  Pulse: 72  Temp: 98.1 F (36.7 C)  Resp: 18   Filed Weights   11/06/14 1504  Weight: 154 lb 11.2 oz (70.171 kg)     Physical Exam  Constitutional: She is oriented to person, place, and time and well-developed, well-nourished, and in no distress.  HENT:  Head: Normocephalic and atraumatic.  Nose: Nose normal.  Mouth/Throat: Oropharynx is clear and moist. No oropharyngeal exudate.  Eyes: Conjunctivae and EOM are normal. Pupils are equal, round, and reactive to light. Right eye exhibits no discharge. Left eye exhibits no discharge. No scleral icterus.  Neck: Normal range of motion. Neck supple. No tracheal deviation present. No thyromegaly present.  Cardiovascular: Normal rate, regular rhythm and normal heart sounds.  Exam reveals no gallop and no friction rub.   No murmur heard. Pulmonary/Chest: Effort  normal and breath sounds normal. She has no wheezes. She has no rales.  Abdominal: Soft. Bowel sounds are normal. She exhibits no distension and no mass. There is no tenderness. There is no rebound and no guarding.  Musculoskeletal: Normal range of motion. She exhibits no edema.  Lymphadenopathy:    She has no cervical adenopathy.  Neurological: She is alert and oriented to person, place, and time. She has normal reflexes. No cranial nerve deficit. Gait normal. Coordination normal.  Skin: Skin is warm and dry. No rash noted.  Psychiatric: Mood, memory, affect and judgment normal.  Nursing note and vitals reviewed.    LABORATORY DATA:  I have reviewed the data as listed Lab Results  Component Value Date   WBC 7.9 11/06/2014   HGB 10.7* 11/06/2014   HCT 32.3* 11/06/2014   MCV 91.0 11/06/2014   PLT 229 11/06/2014     Chemistry      Component Value Date/Time   NA 141 11/06/2014 1550   K 4.1 11/06/2014 1550   CL 112 11/06/2014 1550   CO2 24 11/06/2014 1550    BUN 25* 11/06/2014 1550   CREATININE 1.54* 11/06/2014 1550   CREATININE 1.24* 03/01/2014 1017   CREATININE 1.24* 02/27/2014 1008      Component Value Date/Time   CALCIUM 8.9 11/06/2014 1550   ALKPHOS 58 11/06/2014 1550   AST 27 11/06/2014 1550   ALT 15 11/06/2014 1550   BILITOT 0.5 11/06/2014 1550      ASSESSMENT & PLAN:  Anemia, iron deficiency As an 81 year old female with membranous glomerulopathy, chronic kidney disease, and iron deficiency anemia. GI evaluation has been unremarkable. She has not had a PillCam.  She is on Pradaxa, denies any dark or tarry stool, or blood in her stool, and as previously detailed, GI evaluation has been unremarkable including Hemoccults. I have recommended to her basic labs today including a CBC, iron studies, B12 and folate. If she still has evidence of obvious iron deficiency we will offer her IV iron replacement. I will repeat laboratory studies one month post. If her anemia is improved then I would recommend ongoing observation of counts with iron replacement as needed. If we see no improvement in her counts with iron replacement I would recommend a more thorough anemia evaluation. I discussed this with the patient in detail she agrees with the plan as outlined. We will call her with the results of her laboratory studies today. And I will see her back in approximately 1 month time.    Orders Placed This Encounter  Procedures  . MM Digital Screening    Amy, order in sys, nbs    Standing Status: Future     Number of Occurrences:      Standing Expiration Date: 11/06/2015    Order Specific Question:  Reason for Exam (SYMPTOM  OR DIAGNOSIS REQUIRED)    Answer:  screening    Order Specific Question:  Preferred imaging location?    Answer:  [facility]  . CBC with Differential  . Comprehensive metabolic panel  . Vitamin B12  . Ferritin  . Iron and TIBC  . Folate  . CBC with Differential    Standing Status: Future     Number of  Occurrences:      Standing Expiration Date: 11/07/2015  . Ferritin    Standing Status: Future     Number of Occurrences:      Standing Expiration Date: 11/07/2015    All questions were answered. The patient knows  to call the clinic with any problems, questions or concerns   Molli Hazard, MD MD 11/19/2014 3:06 PM

## 2014-11-07 LAB — FERRITIN: Ferritin: 23 ng/mL (ref 10–291)

## 2014-11-07 LAB — FOLATE

## 2014-11-07 LAB — VITAMIN B12: Vitamin B-12: 746 pg/mL (ref 211–911)

## 2014-11-11 ENCOUNTER — Other Ambulatory Visit (HOSPITAL_COMMUNITY): Payer: Self-pay | Admitting: Hematology & Oncology

## 2014-11-12 ENCOUNTER — Ambulatory Visit (HOSPITAL_COMMUNITY): Payer: Medicare Other

## 2014-11-15 ENCOUNTER — Encounter (HOSPITAL_COMMUNITY): Payer: Self-pay

## 2014-11-15 ENCOUNTER — Encounter (HOSPITAL_BASED_OUTPATIENT_CLINIC_OR_DEPARTMENT_OTHER): Payer: Medicare Other

## 2014-11-15 DIAGNOSIS — D509 Iron deficiency anemia, unspecified: Secondary | ICD-10-CM | POA: Diagnosis not present

## 2014-11-15 MED ORDER — SODIUM CHLORIDE 0.9 % IV SOLN
Freq: Once | INTRAVENOUS | Status: AC
  Administered 2014-11-15: 14:00:00 via INTRAVENOUS

## 2014-11-15 MED ORDER — SODIUM CHLORIDE 0.9 % IV SOLN
510.0000 mg | Freq: Once | INTRAVENOUS | Status: AC
  Administered 2014-11-15: 510 mg via INTRAVENOUS
  Filled 2014-11-15: qty 17

## 2014-11-15 NOTE — Patient Instructions (Signed)
[place] at [facility] Discharge Instructions  RECOMMENDATIONS MADE BY THE CONSULTANT AND ANY TEST RESULTS WILL BE SENT TO YOUR REFERRING PHYSICIAN.  Today your received Feraheme (iron). Return as scheduled for infusion #2.   Thank you for choosing [place] at [facility] to provide your oncology and hematology care.  To afford each patient quality time with our provider, please arrive at least 15 minutes before your scheduled appointment time.    You need to re-schedule your appointment should you arrive 10 or more minutes late.  We strive to give you quality time with our providers, and arriving late affects you and other patients whose appointments are after yours.  Also, if you no show three or more times for appointments you may be dismissed from the clinic at the providers discretion.     Again, thank you for choosing [facility].  Our hope is that these requests will decrease the amount of time that you wait before being seen by our physicians.       _____________________________________________________________  Should you have questions after your visit to [facility], please contact our office at [phone] between the hours of 8:30 a.m. and 4:30 p.m.  Voicemails left after 4:30 p.m. will not be returned until the following business day.  For prescription refill requests, have your pharmacy contact our office.   Ferumoxytol injection What is this medicine? FERUMOXYTOL is an iron complex. Iron is used to make healthy red blood cells, which carry oxygen and nutrients throughout the body. This medicine is used to treat iron deficiency anemia in people with chronic kidney disease. This medicine may be used for other purposes; ask your health care provider or pharmacist if you have questions. COMMON BRAND NAME(S): Feraheme What should I tell my health care provider before I take this medicine? They need to know  if you have any of these conditions: -anemia not caused by low iron levels -high levels of iron in the blood -magnetic resonance imaging (MRI) test scheduled -an unusual or allergic reaction to iron, other medicines, foods, dyes, or preservatives -pregnant or trying to get pregnant -breast-feeding How should I use this medicine? This medicine is for injection into a vein. It is given by a health care professional in a hospital or clinic setting. Talk to your pediatrician regarding the use of this medicine in children. Special care may be needed. Overdosage: If you think you've taken too much of this medicine contact a poison control center or emergency room at once. Overdosage: If you think you have taken too much of this medicine contact a poison control center or emergency room at once. NOTE: This medicine is only for you. Do not share this medicine with others. What if I miss a dose? It is important not to miss your dose. Call your doctor or health care professional if you are unable to keep an appointment. What may interact with this medicine? This medicine may interact with the following medications: -other iron products This list may not describe all possible interactions. Give your health care provider a list of all the medicines, herbs, non-prescription drugs, or dietary supplements you use. Also tell them if you smoke, drink alcohol, or use illegal drugs. Some items may interact with your medicine. What should I watch for while using this medicine? Visit your doctor or healthcare professional regularly. Tell your doctor or healthcare professional if your symptoms do not start to get better  or if they get worse. You may need blood work done while you are taking this medicine. You may need to follow a special diet. Talk to your doctor. Foods that contain iron include: whole grains/cereals, dried fruits, beans, or peas, leafy green vegetables, and organ meats (liver, kidney). What side  effects may I notice from receiving this medicine? Side effects that you should report to your doctor or health care professional as soon as possible: -allergic reactions like skin rash, itching or hives, swelling of the face, lips, or tongue -breathing problems -changes in blood pressure -feeling faint or lightheaded, falls -fever or chills -flushing, sweating, or hot feelings -swelling of the ankles or feet Side effects that usually do not require medical attention (Report these to your doctor or health care professional if they continue or are bothersome.): -diarrhea -headache -nausea, vomiting -stomach pain This list may not describe all possible side effects. Call your doctor for medical advice about side effects. You may report side effects to FDA at 1-800-FDA-1088. Where should I keep my medicine? This drug is given in a hospital or clinic and will not be stored at home. NOTE: This sheet is a summary. It may not cover all possible information. If you have questions about this medicine, talk to your doctor, pharmacist, or health care provider.  2015, Elsevier/Gold Standard. (2012-05-06 15:23:36)

## 2014-11-19 ENCOUNTER — Encounter (HOSPITAL_COMMUNITY): Payer: Self-pay | Admitting: Hematology & Oncology

## 2014-11-19 NOTE — Assessment & Plan Note (Signed)
As an 81 year old female with membranous glomerulopathy, chronic kidney disease, and iron deficiency anemia. GI evaluation has been unremarkable. She has not had a PillCam.  She is on Pradaxa, denies any dark or tarry stool, or blood in her stool, and as previously detailed, GI evaluation has been unremarkable including Hemoccults. I have recommended to her basic labs today including a CBC, iron studies, B12 and folate. If she still has evidence of obvious iron deficiency we will offer her IV iron replacement. I will repeat laboratory studies one month post. If her anemia is improved then I would recommend ongoing observation of counts with iron replacement as needed. If we see no improvement in her counts with iron replacement I would recommend a more thorough anemia evaluation. I discussed this with the patient in detail she agrees with the plan as outlined. We will call her with the results of her laboratory studies today. And I will see her back in approximately 1 month time.

## 2014-11-22 ENCOUNTER — Encounter (HOSPITAL_BASED_OUTPATIENT_CLINIC_OR_DEPARTMENT_OTHER): Payer: Medicare Other

## 2014-11-22 DIAGNOSIS — D509 Iron deficiency anemia, unspecified: Secondary | ICD-10-CM | POA: Diagnosis not present

## 2014-11-22 MED ORDER — SODIUM CHLORIDE 0.9 % IV SOLN
510.0000 mg | Freq: Once | INTRAVENOUS | Status: AC
  Administered 2014-11-22: 510 mg via INTRAVENOUS
  Filled 2014-11-22: qty 17

## 2014-11-22 MED ORDER — SODIUM CHLORIDE 0.9 % IV SOLN
Freq: Once | INTRAVENOUS | Status: AC
  Administered 2014-11-22: 13:00:00 via INTRAVENOUS

## 2014-11-22 NOTE — Patient Instructions (Signed)
[place] at [facility] Discharge Instructions  RECOMMENDATIONS MADE BY THE CONSULTANT AND ANY TEST RESULTS WILL BE SENT TO YOUR REFERRING PHYSICIAN.  Iron infusion #2 of 2. Return as scheduled.  Thank you for choosing [place] at [facility] to provide your oncology and hematology care.  To afford each patient quality time with our provider, please arrive at least 15 minutes before your scheduled appointment time.    You need to re-schedule your appointment should you arrive 10 or more minutes late.  We strive to give you quality time with our providers, and arriving late affects you and other patients whose appointments are after yours.  Also, if you no show three or more times for appointments you may be dismissed from the clinic at the providers discretion.     Again, thank you for choosing [facility].  Our hope is that these requests will decrease the amount of time that you wait before being seen by our physicians.       _____________________________________________________________  Should you have questions after your visit to [facility], please contact our office at [phone] between the hours of 8:30 a.m. and 4:30 p.m.  Voicemails left after 4:30 p.m. will not be returned until the following business day.  For prescription refill requests, have your pharmacy contact our office.

## 2014-11-22 NOTE — Progress Notes (Signed)
Tolerated iron infusion well. 

## 2014-11-26 ENCOUNTER — Inpatient Hospital Stay (HOSPITAL_COMMUNITY): Admission: RE | Admit: 2014-11-26 | Payer: PRIVATE HEALTH INSURANCE | Source: Ambulatory Visit

## 2014-11-29 ENCOUNTER — Encounter: Payer: Self-pay | Admitting: *Deleted

## 2014-12-03 ENCOUNTER — Telehealth: Payer: Self-pay | Admitting: Cardiovascular Disease

## 2014-12-03 NOTE — Telephone Encounter (Signed)
Pt missed a remote on 09/08/15. Pt has Time Suzan Slick phone svc. Pt unaware if her current phone jack functions properly. Pt unsure if her Carelink is plugged into an analog phone line or cable modem. Pt unsure if DSL filter present.   Pt will have her daughter attempt to send a remote when her daughter is over. If daughter is unable to successfully send, daughter will call to trouble shoot.

## 2014-12-03 NOTE — Telephone Encounter (Signed)
°  1. Has your device fired? No  2. Is you device beeping? No  3. Are you experiencing draining or swelling at device site? No  4. Are you calling to see if we received your device transmission? No  5. Have you passed out? No   Pt calling stating that she received a letter about her device and needs to speak to someone in the device clinic. Please call back and advise. Pt states it is ok to leave her a detailed message if she is not at home.

## 2014-12-05 ENCOUNTER — Telehealth: Payer: Self-pay | Admitting: Family Medicine

## 2014-12-05 ENCOUNTER — Other Ambulatory Visit (HOSPITAL_COMMUNITY): Payer: Medicare Other

## 2014-12-05 DIAGNOSIS — H25819 Combined forms of age-related cataract, unspecified eye: Secondary | ICD-10-CM | POA: Diagnosis not present

## 2014-12-05 NOTE — Telephone Encounter (Signed)
plz find out from pharmacy when last script filled for 120?

## 2014-12-05 NOTE — Telephone Encounter (Signed)
Last filled 11/16/14 for #120

## 2014-12-05 NOTE — Telephone Encounter (Signed)
Patient needs Rx for HYDROcodone-acetaminophen (NORCO/VICODIN) 5-325 MG per tablet.  She had a wisdom tooth cut out and had to take a little more.  She will run out before her next visit.  Also, would like a letter stating that she is unable to cross the road and check her mail box so that the post office will move her mailbox to her side of the road.

## 2014-12-06 ENCOUNTER — Encounter (HOSPITAL_BASED_OUTPATIENT_CLINIC_OR_DEPARTMENT_OTHER): Payer: Medicare Other

## 2014-12-06 ENCOUNTER — Encounter: Payer: Self-pay | Admitting: Family Medicine

## 2014-12-06 ENCOUNTER — Encounter (HOSPITAL_COMMUNITY): Payer: Medicare Other | Attending: Hematology & Oncology | Admitting: Hematology & Oncology

## 2014-12-06 ENCOUNTER — Encounter (HOSPITAL_COMMUNITY): Payer: Self-pay | Admitting: Hematology & Oncology

## 2014-12-06 VITALS — BP 147/80 | HR 74 | Temp 98.6°F | Resp 18 | Wt 156.1 lb

## 2014-12-06 DIAGNOSIS — D509 Iron deficiency anemia, unspecified: Secondary | ICD-10-CM | POA: Insufficient documentation

## 2014-12-06 DIAGNOSIS — N183 Chronic kidney disease, stage 3 (moderate): Secondary | ICD-10-CM | POA: Diagnosis not present

## 2014-12-06 DIAGNOSIS — N052 Unspecified nephritic syndrome with diffuse membranous glomerulonephritis: Secondary | ICD-10-CM | POA: Diagnosis not present

## 2014-12-06 LAB — CBC WITH DIFFERENTIAL/PLATELET
BASOS ABS: 0 10*3/uL (ref 0.0–0.1)
BASOS PCT: 0 % (ref 0–1)
EOS PCT: 1 % (ref 0–5)
Eosinophils Absolute: 0.1 10*3/uL (ref 0.0–0.7)
HCT: 34.7 % — ABNORMAL LOW (ref 36.0–46.0)
Hemoglobin: 11.7 g/dL — ABNORMAL LOW (ref 12.0–15.0)
Lymphocytes Relative: 19 % (ref 12–46)
Lymphs Abs: 1.7 10*3/uL (ref 0.7–4.0)
MCH: 30.7 pg (ref 26.0–34.0)
MCHC: 33.7 g/dL (ref 30.0–36.0)
MCV: 91.1 fL (ref 78.0–100.0)
MONOS PCT: 6 % (ref 3–12)
Monocytes Absolute: 0.5 10*3/uL (ref 0.1–1.0)
Neutro Abs: 6.5 10*3/uL (ref 1.7–7.7)
Neutrophils Relative %: 74 % (ref 43–77)
Platelets: 231 10*3/uL (ref 150–400)
RBC: 3.81 MIL/uL — ABNORMAL LOW (ref 3.87–5.11)
RDW: 13.7 % (ref 11.5–15.5)
WBC: 8.8 10*3/uL (ref 4.0–10.5)

## 2014-12-06 MED ORDER — HYDROCODONE-ACETAMINOPHEN 5-325 MG PO TABS: 1.0000 | ORAL_TABLET | ORAL | Status: DC | PRN

## 2014-12-06 NOTE — Telephone Encounter (Signed)
Pt has 14 tablets left. She was taking about 3 per day for 3 weeks after her wisdom tooth was pulled. Now she takes about 2 per day. Pt has appt with you on March 15.

## 2014-12-06 NOTE — Progress Notes (Signed)
LABS FOR FERR,CBCD 

## 2014-12-06 NOTE — Patient Instructions (Signed)
[place] Discharge Instructions  RECOMMENDATIONS MADE BY THE CONSULTANT AND ANY TEST RESULTS WILL BE SENT TO YOUR REFERRING PHYSICIAN.  SPECIAL INSTRUCTIONS/FOLLOW-UP:  Please call prior to your next visit with any problems or concerns. We will see you back again in 3 months  with labs only.  You will return in 6 months for labs and an office visit. We will call you next week if you need additional iron .   Thank you for choosing [place] to provide your oncology and hematology care.  To afford each patient quality time with our providers, please arrive at least 15 minutes before your scheduled appointment time.  With your help, our goal is to use those 15 minutes to complete the necessary work-up to ensure our physicians have the information they need to help with your evaluation and healthcare recommendations.    Effective January 1st, 2014, we ask that you re-schedule your appointment with our physicians should you arrive 10 or more minutes late for your appointment.  We strive to give you quality time with our providers, and arriving late affects you and other patients whose appointments are after yours.    Again, thank you for choosing [facility].  Our hope is that these requests will decrease the amount of time that you wait before being seen by our physicians.       _____________________________________________________________  Should you have questions after your visit to [facility], please contact our office at [phone] between the hours of 8:30 a.m. and 5:00 p.m.  Voicemails left after 4:30 p.m. will not be returned until the following business day.  For prescription refill requests, have your pharmacy contact our office with your prescription refill request.

## 2014-12-06 NOTE — Progress Notes (Signed)
[place] CONSULT NOTE  Patient Care Team: Kathyrn Drown, MD as PCP - General (Family Medicine)  CHIEF COMPLAINTS/PURPOSE OF CONSULTATION:  Anemia EGD/Colonoscopy in 2015 with Dr. Laural Golden  HISTORY OF PRESENTING ILLNESS:  Kelsey Sandoval 81 y.o. female is here because of anemia. She has documented iron deficiency with a serum ferritin on 07/24/2013 of 26 ng/ml.    She has stage III chronic kidney disease based upon her GFR. She has membranous glomerulopathy based on kidney biopsy from November 2015.  She has been treated with IV iron. She states she has noticed remarkable improvement in her energy level. She is actually surprised at how much better she feels. She is here today for additional laboratory study review.    MEDICAL HISTORY:  Past Medical History  Diagnosis Date  . Hypertension   . Dysrhythmia   . Anxiety   . Depression   . Pacemaker   . Arthritis   . Nephrotic syndrome   . Hyperlipidemia   . Pre-diabetes   . Osteopenia   . Atrial fibrillation   . Back pain, chronic   . Cataracts, bilateral   . Urine protein increased     SURGICAL HISTORY: Past Surgical History  Procedure Laterality Date  . Insert / replace / remove pacemaker    . Appendectomy    . Abdominal hysterectomy      partial-pt has no ovaries  . Back surgery    . Colonoscopy  9/05  . Dual chamber pacemaker      2012  . Colonoscopy N/A 10/11/2013    Procedure: COLONOSCOPY;  Surgeon: Rogene Houston, MD;  [facility];  Service: Endoscopy;  Laterality: N/A;  1200  . Esophagogastroduodenoscopy N/A 01/17/2014    Procedure: ESOPHAGOGASTRODUODENOSCOPY (EGD);  Surgeon: Rogene Houston, MD;  Location: [facility];  Service: Endoscopy;  Laterality: N/A;  230  . Esophageal biopsy N/A 01/17/2014    Procedure: BIOPSY;  Surgeon: Rogene Houston, MD;  Location: [facility];  Service: Endoscopy;  Laterality: N/A;  . Wisdom tooth extracton      SOCIAL HISTORY: History    Social History  . Marital Status: Divorced    Spouse Name: N/A  . Number of Children: N/A  . Years of Education: N/A   Occupational History  . Not on file.   Social History Main Topics  . Smoking status: Never Smoker   . Smokeless tobacco: Never Used  . Alcohol Use: No  . Drug Use: No  . Sexual Activity: Not on file   Other Topics Concern  . Not on file   Social History Narrative    FAMILY HISTORY: Family History  Problem Relation Age of Onset  . Colon cancer Brother    indicated that her mother is deceased. She indicated that her father is deceased. She indicated that her sister is alive. She indicated that only one of her two brothers is alive. She indicated that the status of her other is unknown.   ALLERGIES:  is allergic to penicillins; levaquin; sulfa antibiotics; and zithromax.  MEDICATIONS:  Current Outpatient Prescriptions  Medication Sig Dispense Refill  . albuterol (PROVENTIL HFA;VENTOLIN HFA) 108 (90 BASE) MCG/ACT inhaler Inhale 2 puffs into the lungs every 6 (six) hours as needed for wheezing or shortness of breath. 18 g 5  . ALPRAZolam (XANAX) 1 MG tablet Take 1 tablet (1 mg total) by mouth 2 (two) times daily as needed for anxiety. 60 tablet 5  . amLODipine (  NORVASC) 10 MG tablet Take 10 mg by mouth daily.    . citalopram (CELEXA) 20 MG tablet Take 1 tablet (20 mg total) by mouth daily. 30 tablet 5  . dabigatran (PRADAXA) 150 MG CAPS capsule Take 150 mg by mouth 2 (two) times daily.    . flecainide (TAMBOCOR) 50 MG tablet Take 1 tablet (50 mg total) by mouth 2 (two) times daily. 180 tablet 3  . lisinopril (PRINIVIL,ZESTRIL) 40 MG tablet Take 1 tablet (40 mg total) by mouth daily. 30 tablet 5  . metoprolol succinate (TOPROL-XL) 100 MG 24 hr tablet Take 100 mg by mouth daily. Take with or immediately following a meal.    . pravastatin (PRAVACHOL) 80 MG tablet Take 1 tablet (80 mg total) by mouth daily. 30 tablet 12  . vitamin B-12 (CYANOCOBALAMIN) 1000 MCG  tablet Take 1,000 mcg by mouth 2 (two) times daily.    Marland Kitchen HYDROcodone-acetaminophen (NORCO/VICODIN) 5-325 MG per tablet Take 1 tablet by mouth every 4 (four) hours as needed for moderate pain. 120 tablet 0  . omeprazole (PRILOSEC) 20 MG capsule Take 1 capsule (20 mg total) by mouth 2 (two) times daily before a meal. 30 capsule 12   No current facility-administered medications for this visit.    Review of Systems  Constitutional: Positive for malaise/fatigue.  Musculoskeletal: Positive for joint pain.  All other systems reviewed and are negative.   PHYSICAL EXAMINATION: ECOG PERFORMANCE STATUS: 0 - Asymptomatic  Filed Vitals:   12/06/14 1100  BP: 147/80  Pulse: 74  Temp: 98.6 F (37 C)  Resp: 18   Filed Weights   12/06/14 1100  Weight: 156 lb 1.6 oz (70.806 kg)     Physical Exam  Constitutional: She is oriented to person, place, and time and well-developed, well-nourished, and in no distress.  HENT:  Head: Normocephalic and atraumatic.  Nose: Nose normal.  Mouth/Throat: Oropharynx is clear and moist. No oropharyngeal exudate.  Eyes: Conjunctivae and EOM are normal. Pupils are equal, round, and reactive to light. Right eye exhibits no discharge. Left eye exhibits no discharge. No scleral icterus.  Neck: Normal range of motion. Neck supple. No tracheal deviation present. No thyromegaly present.  Cardiovascular: Normal rate, regular rhythm and normal heart sounds.  Exam reveals no gallop and no friction rub.   No murmur heard. Pulmonary/Chest: Effort normal and breath sounds normal. She has no wheezes. She has no rales.  Abdominal: Soft. Bowel sounds are normal. She exhibits no distension and no mass. There is no tenderness. There is no rebound and no guarding.  Musculoskeletal: Normal range of motion. She exhibits no edema.  Lymphadenopathy:    She has no cervical adenopathy.  Neurological: She is alert and oriented to person, place, and time. She has normal reflexes. No  cranial nerve deficit. Gait normal. Coordination normal.  Skin: Skin is warm and dry. No rash noted.  Psychiatric: Mood, memory, affect and judgment normal.  Nursing note and vitals reviewed.    LABORATORY DATA:  I have reviewed the data as listed Lab Results  Component Value Date   WBC 8.8 12/06/2014   HGB 11.7* 12/06/2014   HCT 34.7* 12/06/2014   MCV 91.1 12/06/2014   PLT 231 12/06/2014     Chemistry      Component Value Date/Time   NA 141 11/06/2014 1550   K 4.1 11/06/2014 1550   CL 112 11/06/2014 1550   CO2 24 11/06/2014 1550   BUN 25* 11/06/2014 1550   CREATININE 1.54* 11/06/2014 1550  CREATININE 1.24* 03/01/2014 1017   CREATININE 1.24* 02/27/2014 1008      Component Value Date/Time   CALCIUM 8.9 11/06/2014 1550   ALKPHOS 58 11/06/2014 1550   AST 27 11/06/2014 1550   ALT 15 11/06/2014 1550   BILITOT 0.5 11/06/2014 1550      ASSESSMENT & PLAN:   Iron deficiency anemia, intolerance to oral iron Chronic kidney disease, stage III  Her blood counts were reviewed and improved. Her energy level is also improved. I have recommended ongoing observation. We will follow her blood counts and ferritin levels every 3 months. I will see her back in 6 months for a formal office visit. She was instructed to call us in between lab appointments if she had any questions problems or concerns. Given that her blood counts have almost completely normalized with iron replacement, I do not currently feel the need for an additional anemia evaluation.   Orders Placed This Encounter  Procedures  . CBC with Differential    Standing Status: Standing     Number of Occurrences: 12     Standing Expiration Date: 12/05/2016  . Comprehensive metabolic panel    Standing Status: Standing     Number of Occurrences: 12     Standing Expiration Date: 12/05/2016  . Ferritin    Standing Status: Standing     Number of Occurrences: 12     Standing Expiration Date: 12/05/2016  . Iron and TIBC     Standing Status: Standing     Number of Occurrences: 12     Standing Expiration Date: 12/05/2016    All questions were answered. The patient knows to call the clinic with any problems, questions or concerns   Molli Hazard, MD MD 12/23/2014 2:15 PM

## 2014-12-06 NOTE — Telephone Encounter (Signed)
Script signed for her monthly prescription, may fill March 6th

## 2014-12-06 NOTE — Telephone Encounter (Signed)
Nurses, please find out from the patient how many more tablets she has left. She may have 120 tablets but review with me what you found out first. Secondly she will need an office visit in early April before receiving any more prescriptions. Finally a letter was written Randall Hiss can print it out.

## 2014-12-06 NOTE — Telephone Encounter (Signed)
Erica, please have this patient's letter ready to be picked up this afternoon. Thanks!

## 2014-12-07 LAB — FERRITIN: Ferritin: 809 ng/mL — ABNORMAL HIGH (ref 10–291)

## 2014-12-11 ENCOUNTER — Telehealth: Payer: Self-pay | Admitting: Cardiovascular Disease

## 2014-12-11 NOTE — Telephone Encounter (Signed)
Informed patient/daughter that transmissions have not been received. Daughter states that pt has [facility] telephone service. I explained to daughter that I could send them a cell adaptor. Daughter voiced understanding and agreed to proceed. Also, appt made for Device Clinic on 3/10 @ 3:30pm. Address given to daughter.

## 2014-12-11 NOTE — Telephone Encounter (Signed)
Pt called in stating that she has tried to submit some readings for her pace maker and she keeps receiving letters saying that no reading had been processed. Please call  Thanks

## 2014-12-13 ENCOUNTER — Telehealth: Payer: Self-pay | Admitting: Cardiovascular Disease

## 2014-12-13 ENCOUNTER — Ambulatory Visit (INDEPENDENT_AMBULATORY_CARE_PROVIDER_SITE_OTHER): Payer: Medicare Other | Admitting: *Deleted

## 2014-12-13 DIAGNOSIS — E785 Hyperlipidemia, unspecified: Secondary | ICD-10-CM | POA: Diagnosis not present

## 2014-12-13 DIAGNOSIS — Z79891 Long term (current) use of opiate analgesic: Secondary | ICD-10-CM | POA: Diagnosis not present

## 2014-12-13 DIAGNOSIS — I495 Sick sinus syndrome: Secondary | ICD-10-CM | POA: Diagnosis not present

## 2014-12-13 DIAGNOSIS — R609 Edema, unspecified: Secondary | ICD-10-CM | POA: Diagnosis not present

## 2014-12-13 DIAGNOSIS — R21 Rash and other nonspecific skin eruption: Secondary | ICD-10-CM | POA: Diagnosis not present

## 2014-12-13 DIAGNOSIS — M25562 Pain in left knee: Secondary | ICD-10-CM | POA: Diagnosis not present

## 2014-12-13 DIAGNOSIS — M25561 Pain in right knee: Secondary | ICD-10-CM | POA: Diagnosis not present

## 2014-12-13 DIAGNOSIS — M7989 Other specified soft tissue disorders: Secondary | ICD-10-CM | POA: Diagnosis not present

## 2014-12-13 DIAGNOSIS — R5383 Other fatigue: Secondary | ICD-10-CM | POA: Diagnosis not present

## 2014-12-13 DIAGNOSIS — N059 Unspecified nephritic syndrome with unspecified morphologic changes: Secondary | ICD-10-CM | POA: Diagnosis not present

## 2014-12-13 DIAGNOSIS — R002 Palpitations: Secondary | ICD-10-CM | POA: Diagnosis not present

## 2014-12-13 DIAGNOSIS — N052 Unspecified nephritic syndrome with diffuse membranous glomerulonephritis: Secondary | ICD-10-CM | POA: Diagnosis not present

## 2014-12-13 DIAGNOSIS — R809 Proteinuria, unspecified: Secondary | ICD-10-CM | POA: Diagnosis not present

## 2014-12-13 DIAGNOSIS — I10 Essential (primary) hypertension: Secondary | ICD-10-CM | POA: Diagnosis not present

## 2014-12-13 DIAGNOSIS — Z79899 Other long term (current) drug therapy: Secondary | ICD-10-CM | POA: Diagnosis not present

## 2014-12-13 LAB — MDC_IDC_ENUM_SESS_TYPE_REMOTE
Battery Impedance: 156 Ohm
Battery Remaining Longevity: 149 mo
Battery Voltage: 2.79 V
Brady Statistic AP VP Percent: 0 %
Brady Statistic AS VP Percent: 0 %
Date Time Interrogation Session: 20160310140811
Lead Channel Impedance Value: 450 Ohm
Lead Channel Pacing Threshold Amplitude: 1 V
Lead Channel Pacing Threshold Amplitude: 1 V
Lead Channel Pacing Threshold Pulse Width: 0.4 ms
Lead Channel Pacing Threshold Pulse Width: 0.4 ms
Lead Channel Sensing Intrinsic Amplitude: 1.4 mV
Lead Channel Setting Pacing Pulse Width: 0.4 ms
MDC IDC MSMT LEADCHNL RV IMPEDANCE VALUE: 538 Ohm
MDC IDC MSMT LEADCHNL RV SENSING INTR AMPL: 11.2 mV
MDC IDC SET LEADCHNL RA PACING AMPLITUDE: 2 V
MDC IDC SET LEADCHNL RV PACING AMPLITUDE: 2 V
MDC IDC SET LEADCHNL RV SENSING SENSITIVITY: 5.6 mV
MDC IDC STAT BRADY AP VS PERCENT: 29 %
MDC IDC STAT BRADY AS VS PERCENT: 71 %

## 2014-12-13 NOTE — Telephone Encounter (Signed)
New problem   Pt want to know if her device check went through b/c is it did she wouldn't need to come for her appt today. Please advise.

## 2014-12-13 NOTE — Telephone Encounter (Signed)
LMOVM informing pt that transmission was received.  

## 2014-12-14 ENCOUNTER — Encounter: Payer: Self-pay | Admitting: Cardiovascular Disease

## 2014-12-18 ENCOUNTER — Ambulatory Visit (INDEPENDENT_AMBULATORY_CARE_PROVIDER_SITE_OTHER): Payer: Medicare Other | Admitting: Family Medicine

## 2014-12-18 ENCOUNTER — Encounter: Payer: Self-pay | Admitting: Family Medicine

## 2014-12-18 VITALS — BP 134/76 | Ht 69.0 in | Wt 160.8 lb

## 2014-12-18 DIAGNOSIS — G8929 Other chronic pain: Secondary | ICD-10-CM | POA: Diagnosis not present

## 2014-12-18 DIAGNOSIS — I10 Essential (primary) hypertension: Secondary | ICD-10-CM | POA: Diagnosis not present

## 2014-12-18 DIAGNOSIS — N059 Unspecified nephritic syndrome with unspecified morphologic changes: Secondary | ICD-10-CM

## 2014-12-18 DIAGNOSIS — M549 Dorsalgia, unspecified: Secondary | ICD-10-CM

## 2014-12-18 DIAGNOSIS — D509 Iron deficiency anemia, unspecified: Secondary | ICD-10-CM

## 2014-12-18 MED ORDER — OMEPRAZOLE 20 MG PO CPDR
20.0000 mg | DELAYED_RELEASE_CAPSULE | Freq: Two times a day (BID) | ORAL | Status: DC
Start: 2014-12-18 — End: 2015-03-11

## 2014-12-18 MED ORDER — HYDROCODONE-ACETAMINOPHEN 5-325 MG PO TABS: 1.0000 | ORAL_TABLET | ORAL | Status: DC | PRN

## 2014-12-18 NOTE — Progress Notes (Signed)
   Subjective:    Patient ID: Kelsey Sandoval, female    DOB: [birth year], 81 y.o.   MRN: [id number]  HPI This patient was seen today for chronic pain  The medication list was reviewed and updated.   -Compliance with pain medication: yes The patient was advised the importance of maintaining medication and not using illegal substances with these.  Refills needed: yes  The patient was educated that we can provide 3 monthly scripts for their medication, it is their responsibility to follow the instructions.  Side effects or complications from medications: none  Patient is aware that pain medications are meant to minimize the severity of the pain to allow their pain levels to improve to allow for better function. They are aware of that pain medications cannot totally remove their pain.  Due for UDT ( at least once per year) : Next visit  Patient needs CT scan of chest without contrast scheduled for specialist at [facility]  This patient has anemia. She is undergone iron infusion. She is up-to-date on screening colonoscopy. She states she feels better with the aren't infusion. She denies any bleeding.  She still has not gotten her mammogram done I told her she needs to call and schedule this she states she will  She saw the kidney specialist she had multiple questions greater and 25 minutes was spent with this patient discussing multiple questions regarding her kidneys and discussing the stress that she is under as well as her chronic pain. Greater than half the time was spent with these discussions.  The kidney specialist recommended to get a CAT scan without contrast of the chest before starting on immunotherapy and prednisone for her kidney issue. I spoke with the specialists at [facility] they stated that they would prefer this test if possible  Review of Systems Patient denies chest tightness pressure pain shortness of breath denies excessive urination relates some swelling in the legs      Objective:   Physical Exam Lungs are clear heart regular pulse normal extremities no edema skin warm dry       Assessment & Plan:  1. Iron deficiency anemia She feels much better since having the infusion she is following up with hematology later this spring she has not noticed any bleeding  2. Nephritis Her kidneys are going to be further worked on by [employer]. I reviewed over the period a spoke with the specialists. They wanted a CAT scan of her chest. It might be difficult to get this approved she is not a smoker but she does have history of COPD. Patient not having  weight loss or fever currently. Patient does have a history of COPD/emphysema and also has dyspnea. Hopefully CAT scan can get approved.  3. Essential hypertension Blood pressure under decent control continue current measures avoid excessive salt use  4. Chronic back pain Chronic back pain pain medication prescribed patient denies abusing it she was told not to use any anti-inflammatories.  She is to follow-up in approximately 3 months. If the CAT scan cannot be approved then we will go with the chest x-ray.

## 2014-12-18 NOTE — Patient Instructions (Signed)

## 2014-12-23 ENCOUNTER — Encounter (HOSPITAL_COMMUNITY): Payer: Self-pay | Admitting: Hematology & Oncology

## 2014-12-24 ENCOUNTER — Telehealth: Payer: Self-pay | Admitting: Family Medicine

## 2014-12-24 DIAGNOSIS — M25561 Pain in right knee: Secondary | ICD-10-CM | POA: Diagnosis not present

## 2014-12-24 DIAGNOSIS — Z8679 Personal history of other diseases of the circulatory system: Secondary | ICD-10-CM | POA: Diagnosis not present

## 2014-12-24 DIAGNOSIS — M25562 Pain in left knee: Secondary | ICD-10-CM | POA: Diagnosis not present

## 2014-12-24 DIAGNOSIS — J449 Chronic obstructive pulmonary disease, unspecified: Secondary | ICD-10-CM

## 2014-12-24 DIAGNOSIS — Z6824 Body mass index (BMI) 24.0-24.9, adult: Secondary | ICD-10-CM | POA: Diagnosis not present

## 2014-12-24 DIAGNOSIS — M1712 Unilateral primary osteoarthritis, left knee: Secondary | ICD-10-CM | POA: Diagnosis not present

## 2014-12-24 DIAGNOSIS — M1711 Unilateral primary osteoarthritis, right knee: Secondary | ICD-10-CM | POA: Diagnosis not present

## 2014-12-24 DIAGNOSIS — I10 Essential (primary) hypertension: Secondary | ICD-10-CM | POA: Diagnosis not present

## 2014-12-24 NOTE — Telephone Encounter (Signed)
Pt is wanting to know when the xray will be done. Pt is needing this done Before 01/17/15.

## 2014-12-24 NOTE — Telephone Encounter (Signed)
Please talk with the patient. I spoke with the specialists partner at [facility]. They were not certain why a CT scan was wanted by the other specialists. Please tell the patient we can try to get a CAT scan but there is a possibility that her insurance will not cover it. Whereas we can get a plain x-ray of the chest without difficulty. Typically they will not do a CAT scan unless the chest x-ray is abnormal or a patient is having hemoptysis or chronic cough. Please nurses talk with the patient see what she would like for [place] to proceed with.

## 2014-12-25 NOTE — Telephone Encounter (Signed)
Patient wants to try a chest xray first

## 2014-12-25 NOTE — Telephone Encounter (Signed)
Chest Xray ordered at [facility]. Patient notified.

## 2014-12-25 NOTE — Telephone Encounter (Signed)
Please order chest x-ray she can go ahead and get that done this week, reason COPD

## 2014-12-27 ENCOUNTER — Other Ambulatory Visit: Payer: Self-pay | Admitting: Family Medicine

## 2014-12-27 DIAGNOSIS — Z1231 Encounter for screening mammogram for malignant neoplasm of breast: Secondary | ICD-10-CM

## 2015-01-03 ENCOUNTER — Encounter: Payer: Self-pay | Admitting: *Deleted

## 2015-01-07 ENCOUNTER — Ambulatory Visit (HOSPITAL_COMMUNITY)
Admission: RE | Admit: 2015-01-07 | Discharge: 2015-01-07 | Disposition: A | Discharge status: Home / Self Care | Payer: Medicare Other | Source: Ambulatory Visit | Attending: Family Medicine | Admitting: Family Medicine

## 2015-01-07 ENCOUNTER — Other Ambulatory Visit: Payer: Self-pay | Admitting: Family Medicine

## 2015-01-07 DIAGNOSIS — Z1231 Encounter for screening mammogram for malignant neoplasm of breast: Secondary | ICD-10-CM | POA: Diagnosis present

## 2015-01-07 DIAGNOSIS — J449 Chronic obstructive pulmonary disease, unspecified: Secondary | ICD-10-CM | POA: Diagnosis not present

## 2015-01-17 DIAGNOSIS — N022 Recurrent and persistent hematuria with diffuse membranous glomerulonephritis: Secondary | ICD-10-CM | POA: Diagnosis not present

## 2015-01-17 DIAGNOSIS — N289 Disorder of kidney and ureter, unspecified: Secondary | ICD-10-CM | POA: Insufficient documentation

## 2015-01-22 ENCOUNTER — Encounter: Payer: Self-pay | Admitting: *Deleted

## 2015-01-22 NOTE — Addendum Note (Signed)
Addended byChauncy Lean. on: 01/22/2015 02:29 PM   Modules accepted: Level of Service, SmartSet

## 2015-01-22 NOTE — Progress Notes (Signed)
This encounter was created in error - please disregard.

## 2015-01-23 NOTE — Progress Notes (Signed)
Remote pacemaker transmission.   

## 2015-01-30 ENCOUNTER — Encounter: Payer: Self-pay | Admitting: Cardiology

## 2015-02-01 ENCOUNTER — Encounter: Payer: Self-pay | Admitting: Cardiovascular Disease

## 2015-02-04 ENCOUNTER — Encounter (HOSPITAL_COMMUNITY): Payer: Medicare Other | Attending: Hematology & Oncology

## 2015-02-04 ENCOUNTER — Other Ambulatory Visit (HOSPITAL_COMMUNITY): Payer: Self-pay | Admitting: *Deleted

## 2015-02-04 DIAGNOSIS — N183 Chronic kidney disease, stage 3 (moderate): Secondary | ICD-10-CM

## 2015-02-04 DIAGNOSIS — H25813 Combined forms of age-related cataract, bilateral: Secondary | ICD-10-CM | POA: Diagnosis not present

## 2015-02-04 DIAGNOSIS — D509 Iron deficiency anemia, unspecified: Secondary | ICD-10-CM | POA: Insufficient documentation

## 2015-02-04 DIAGNOSIS — H25812 Combined forms of age-related cataract, left eye: Secondary | ICD-10-CM | POA: Diagnosis not present

## 2015-02-04 DIAGNOSIS — N052 Unspecified nephritic syndrome with diffuse membranous glomerulonephritis: Secondary | ICD-10-CM

## 2015-02-04 DIAGNOSIS — H02836 Dermatochalasis of left eye, unspecified eyelid: Secondary | ICD-10-CM | POA: Diagnosis not present

## 2015-02-04 LAB — CBC WITH DIFFERENTIAL/PLATELET
Basophils Absolute: 0 10*3/uL (ref 0.0–0.1)
Basophils Relative: 0 % (ref 0–1)
Eosinophils Absolute: 0 10*3/uL (ref 0.0–0.7)
Eosinophils Relative: 0 % (ref 0–5)
HCT: 36.9 % (ref 36.0–46.0)
HEMOGLOBIN: 12.6 g/dL (ref 12.0–15.0)
LYMPHS ABS: 2.1 10*3/uL (ref 0.7–4.0)
Lymphocytes Relative: 22 % (ref 12–46)
MCH: 31.7 pg (ref 26.0–34.0)
MCHC: 34.1 g/dL (ref 30.0–36.0)
MCV: 92.9 fL (ref 78.0–100.0)
MONOS PCT: 4 % (ref 3–12)
Monocytes Absolute: 0.4 10*3/uL (ref 0.1–1.0)
Neutro Abs: 6.9 10*3/uL (ref 1.7–7.7)
Neutrophils Relative %: 74 % (ref 43–77)
Platelets: 223 10*3/uL (ref 150–400)
RBC: 3.97 MIL/uL (ref 3.87–5.11)
RDW: 13.3 % (ref 11.5–15.5)
WBC: 9.5 10*3/uL (ref 4.0–10.5)

## 2015-02-04 LAB — FERRITIN: Ferritin: 275 ng/mL (ref 11–307)

## 2015-02-04 NOTE — Progress Notes (Signed)
Labs drawn

## 2015-02-05 NOTE — Progress Notes (Signed)
Kelsey Sandoval called to get her lab results from yesterday. "Stated she felt tired"  Labs were normal , verified no change in plan for her by Kirby Crigler PA-C.

## 2015-02-13 ENCOUNTER — Encounter: Payer: Self-pay | Admitting: Family Medicine

## 2015-02-13 ENCOUNTER — Ambulatory Visit (INDEPENDENT_AMBULATORY_CARE_PROVIDER_SITE_OTHER): Payer: Medicare Other | Admitting: Family Medicine

## 2015-02-13 VITALS — BP 126/88 | Ht 69.0 in | Wt 143.0 lb

## 2015-02-13 DIAGNOSIS — R634 Abnormal weight loss: Secondary | ICD-10-CM

## 2015-02-13 DIAGNOSIS — N049 Nephrotic syndrome with unspecified morphologic changes: Secondary | ICD-10-CM | POA: Diagnosis not present

## 2015-02-13 DIAGNOSIS — R5383 Other fatigue: Secondary | ICD-10-CM | POA: Diagnosis not present

## 2015-02-13 DIAGNOSIS — M792 Neuralgia and neuritis, unspecified: Secondary | ICD-10-CM

## 2015-02-13 DIAGNOSIS — R110 Nausea: Secondary | ICD-10-CM

## 2015-02-13 MED ORDER — ONDANSETRON 8 MG PO TBDP: 8.0000 mg | ORAL_TABLET | Freq: Three times a day (TID) | ORAL | Status: DC | PRN

## 2015-02-13 NOTE — Patient Instructions (Signed)
Stop celexa  May use Zofran for nausea  Recheck here in 7 to 10 days  Please do your lab work

## 2015-02-13 NOTE — Progress Notes (Signed)
   Subjective:    Patient ID: Kelsey Sandoval, female    DOB: Apr 28, 1933, 81 y.o.   MRN: [id number]  HPI Severe fatigue patients had severe fatigue over the past few months not what you eat not 1 do much she finds herself feeling stressed out at times overwhelmed but denies being depressed currently she has been taking Celexa she thinks it might have helped her moods  Every am nausea this is been going on for approximately 3 months interesting enough this began when she started Celexa she denies having excessive nausea nighttime but it is pervasive through the morning hours we talked about measures to help including stopping Celexa tried Zofran she denies hematemesis she denies hematochezia no fevers or chills no abdominal pain she has lost weight though.   Review of Systems  Constitutional: Negative for fever and chills.  Respiratory: Negative for cough.   Cardiovascular: Negative for chest pain.  Gastrointestinal: Positive for nausea and diarrhea. Negative for vomiting.       Objective:   Physical Exam  Lungs clear hearts regular abdomen soft no guarding rebound tenderness extremities no edema skin warm dry      Assessment & Plan:  Patient with intractable nausea for several months it could be related to the antidepressant she is on stopped Celexa. If ongoing nausea and may need other testing. Lab work was ordered. Await results  Nephrotic syndrome patient is undergoing newer treatment at [facility] coming up in several months.  Greater than 25 minutes spent with this patient discussing her nephrotic syndrome the treatment of this as well as nausea and weight loss in her medications. Plus also the testing.  Patient with history of aren't deficient anemia but this is corrected. She is had normal B12 in the past we do need to do some lab work to rule out other causes for her weight loss and nausea. I believe that her weight loss and nausea is related to the Celexa making her feel so nauseated  hopefully since we stopped that this will help I recommend we see her back again in 7-10 days may need further testing.

## 2015-02-14 ENCOUNTER — Encounter: Payer: Self-pay | Admitting: Family Medicine

## 2015-02-14 ENCOUNTER — Encounter: Payer: Self-pay | Admitting: Cardiology

## 2015-02-14 ENCOUNTER — Other Ambulatory Visit: Payer: Self-pay

## 2015-02-14 DIAGNOSIS — E039 Hypothyroidism, unspecified: Secondary | ICD-10-CM | POA: Insufficient documentation

## 2015-02-14 LAB — BASIC METABOLIC PANEL
BUN / CREAT RATIO: 21 (ref 11–26)
BUN: 18 mg/dL (ref 8–27)
CALCIUM: 8.6 mg/dL — AB (ref 8.7–10.3)
CO2: 23 mmol/L (ref 18–29)
CREATININE: 0.85 mg/dL (ref 0.57–1.00)
Chloride: 105 mmol/L (ref 97–108)
GFR calc Af Amer: 75 mL/min/{1.73_m2} (ref >59)
GFR calc non Af Amer: 65 mL/min/{1.73_m2} (ref >59)
Glucose: 94 mg/dL (ref 65–99)
POTASSIUM: 4.5 mmol/L (ref 3.5–5.2)
SODIUM: 143 mmol/L (ref 134–144)

## 2015-02-14 LAB — T4, FREE: Free T4: 1.05 ng/dL (ref 0.82–1.77)

## 2015-02-14 LAB — HEPATIC FUNCTION PANEL
ALK PHOS: 66 IU/L (ref 39–117)
ALT: 16 IU/L (ref 0–32)
AST: 24 IU/L (ref 0–40)
Albumin: 3.2 g/dL — ABNORMAL LOW (ref 3.5–4.8)
BILIRUBIN TOTAL: 0.2 mg/dL (ref 0.0–1.2)
BILIRUBIN, DIRECT: 0.09 mg/dL (ref 0.00–0.40)
Total Protein: 5 g/dL — ABNORMAL LOW (ref 6.0–8.5)

## 2015-02-14 LAB — TSH: TSH: 9.42 u[IU]/mL — ABNORMAL HIGH (ref 0.450–4.500)

## 2015-02-14 LAB — LIPASE: Lipase: 35 U/L (ref 0–59)

## 2015-02-14 MED ORDER — LEVOTHYROXINE SODIUM 25 MCG PO TABS: 25.0000 ug | ORAL_TABLET | Freq: Every day | ORAL | Status: DC

## 2015-02-18 ENCOUNTER — Ambulatory Visit: Payer: Medicare Other | Admitting: Family Medicine

## 2015-02-20 DIAGNOSIS — N022 Recurrent and persistent hematuria with diffuse membranous glomerulonephritis: Secondary | ICD-10-CM | POA: Diagnosis not present

## 2015-02-21 ENCOUNTER — Ambulatory Visit (INDEPENDENT_AMBULATORY_CARE_PROVIDER_SITE_OTHER): Payer: Medicare Other | Admitting: Family Medicine

## 2015-02-21 ENCOUNTER — Encounter: Payer: Self-pay | Admitting: Family Medicine

## 2015-02-21 VITALS — BP 140/84 | Ht 69.0 in | Wt 154.4 lb

## 2015-02-21 DIAGNOSIS — E038 Other specified hypothyroidism: Secondary | ICD-10-CM | POA: Diagnosis not present

## 2015-02-21 DIAGNOSIS — N049 Nephrotic syndrome with unspecified morphologic changes: Secondary | ICD-10-CM

## 2015-02-21 DIAGNOSIS — I499 Cardiac arrhythmia, unspecified: Secondary | ICD-10-CM | POA: Diagnosis not present

## 2015-02-21 NOTE — Patient Instructions (Signed)
For constipation- use Miralax use 1/2 to 1 capful in 8 oz of water daily  Thyroid recheck that test in 6 weeks - orders are in the system  Moderate protein intake with meals ( deck of cards)  One calcium per day ( 600 mg)

## 2015-02-21 NOTE — Progress Notes (Signed)
   Subjective:    Patient ID: Kelsey Sandoval, female    DOB: 01-22-1933, 81 y.o.   MRN: [id number]  HPI  Patient is being seen today for a follow up to discuss lab results and changes in medication. Patient states that nausea has still been going on since last appointment but no s/s of nausea at this time. Patient relates nausea daily up until today no nausea today stopped Celexa several days ago. Recently started on thyroid medicine for elevated TSH Recent lab work did not show anemia Son was present with her today he was wondering if there is a possibility of leukemia. Given the normal-looking CBC I think that is unlikely Patient relates decent appetite today Went through treatment at [facility] for nephrotic syndrome able to do another treatment in several weeks We discussed protein intake in the importance of doing so but not overdoing it Patient is on blood thinner but not having any sign of bleeding She does follow through with hematology.  Review of Systems She denies shortness breath nausea vomiting diarrhea currently she did have nausea and some weight loss but she feels like that could start doing better.    Objective:   Physical Exam  Irregular heartbeat but rate controlled lungs are clear no crackles abdomen soft no guarding or rebound extremities no edema skin warm dry      Assessment & Plan:  Patient's weight seems to be stabilized we will recheck her again in several weeks to recheck this Patient undergoing treatment for nephrotic syndrome via [facility] that seems to be helping No sign of anemia on recent blood work Hypothyroidism start levothyroxine as previously done follow-up thyroid testing in approximately 6 weeks with follow-up office visit after that Patient was told if she keeps having nausea she needs to let us know and we would want to do some testing on her. EKG was completed shows a normal sinus rhythm with some PACs no need for further testing currently. 25 minutes  spent with patient

## 2015-02-25 NOTE — Patient Instructions (Signed)
Your procedure is scheduled on: 02/28/2015  Report to [facility] at  800   AM.  Call this number if you have problems the morning of surgery: [phone]   Do not eat food or drink liquids :After Midnight.      Take these medicines the morning of surgery with A SIP OF WATER: xanax, amlodipine, tambocor, hydrocodone, levothyroxine, lisinopril, metoprolol, prilosec,zofran. Take your inhaler before you come.   Do not wear jewelry, make-up or nail polish.  Do not wear lotions, powders, or perfumes.   Do not shave 48 hours prior to surgery.  Do not bring valuables to the hospital.  Contacts, dentures or bridgework may not be worn into surgery.  Leave suitcase in the car. After surgery it may be brought to your room.  For patients admitted to the hospital, checkout time is 11:00 AM the day of discharge.   Patients discharged the day of surgery will not be allowed to drive home.  :     Please read over the following fact sheets that you were given: Coughing and Deep Breathing, Surgical Site Infection Prevention, Anesthesia Post-op Instructions and Care and Recovery After Surgery    Cataract A cataract is a clouding of the lens of the eye. When a lens becomes cloudy, vision is reduced based on the degree and nature of the clouding. Many cataracts reduce vision to some degree. Some cataracts make people more near-sighted as they develop. Other cataracts increase glare. Cataracts that are ignored and become worse can sometimes look white. The white color can be seen through the pupil. CAUSES   Aging. However, cataracts may occur at any age, even in newborns.   Certain drugs.   Trauma to the eye.   Certain diseases such as diabetes.   Specific eye diseases such as chronic inflammation inside the eye or a sudden attack of a rare form of glaucoma.   Inherited or acquired medical problems.  SYMPTOMS   Gradual, progressive drop in vision in the affected eye.   Severe, rapid visual loss. This  most often happens when trauma is the cause.  DIAGNOSIS  To detect a cataract, an eye doctor examines the lens. Cataracts are best diagnosed with an exam of the eyes with the pupils enlarged (dilated) by drops.  TREATMENT  For an early cataract, vision may improve by using different eyeglasses or stronger lighting. If that does not help your vision, surgery is the only effective treatment. A cataract needs to be surgically removed when vision loss interferes with your everyday activities, such as driving, reading, or watching TV. A cataract may also have to be removed if it prevents examination or treatment of another eye problem. Surgery removes the cloudy lens and usually replaces it with a substitute lens (intraocular lens, IOL).  At a time when both you and your doctor agree, the cataract will be surgically removed. If you have cataracts in both eyes, only one is usually removed at a time. This allows the operated eye to heal and be out of danger from any possible problems after surgery (such as infection or poor wound healing). In rare cases, a cataract may be doing damage to your eye. In these cases, your caregiver may advise surgical removal right away. The vast majority of people who have cataract surgery have better vision afterward. HOME CARE INSTRUCTIONS  If you are not planning surgery, you may be asked to do the following:  Use different eyeglasses.   Use stronger or brighter lighting.  Ask your eye doctor about reducing your medicine dose or changing medicines if it is thought that a medicine caused your cataract. Changing medicines does not make the cataract go away on its own.   Become familiar with your surroundings. Poor vision can lead to injury. Avoid bumping into things on the affected side. You are at a higher risk for tripping or falling.   Exercise extreme care when driving or operating machinery.   Wear sunglasses if you are sensitive to bright light or experiencing  problems with glare.  SEEK IMMEDIATE MEDICAL CARE IF:   You have a worsening or sudden vision loss.   You notice redness, swelling, or increasing pain in the eye.   You have a fever.  Document Released: 09/21/2005 Document Revised: 09/10/2011 Document Reviewed: 05/15/2011 [facility] Patient Information 2012 [place].PATIENT INSTRUCTIONS POST-ANESTHESIA  IMMEDIATELY FOLLOWING SURGERY:  Do not drive or operate machinery for the first twenty four hours after surgery.  Do not make any important decisions for twenty four hours after surgery or while taking narcotic pain medications or sedatives.  If you develop intractable nausea and vomiting or a severe headache please notify your doctor immediately.  FOLLOW-UP:  Please make an appointment with your surgeon as instructed. You do not need to follow up with anesthesia unless specifically instructed to do so.  WOUND CARE INSTRUCTIONS (if applicable):  Keep a dry clean dressing on the anesthesia/puncture wound site if there is drainage.  Once the wound has quit draining you may leave it open to air.  Generally you should leave the bandage intact for twenty four hours unless there is drainage.  If the epidural site drains for more than 36-48 hours please call the anesthesia department.  QUESTIONS?:  Please feel free to call your physician or the hospital operator if you have any questions, and they will be happy to assist you.

## 2015-02-26 ENCOUNTER — Encounter (HOSPITAL_COMMUNITY)
Admission: RE | Admit: 2015-02-26 | Discharge: 2015-02-26 | Disposition: A | Discharge status: Home / Self Care | Payer: Medicare Other | Source: Ambulatory Visit | Attending: Ophthalmology | Admitting: Ophthalmology

## 2015-02-26 ENCOUNTER — Encounter (HOSPITAL_COMMUNITY): Payer: Self-pay

## 2015-02-26 DIAGNOSIS — H25812 Combined forms of age-related cataract, left eye: Secondary | ICD-10-CM | POA: Diagnosis not present

## 2015-02-27 MED ORDER — PHENYLEPHRINE HCL 2.5 % OP SOLN
OPHTHALMIC | Status: AC
  Filled 2015-02-27: qty 15

## 2015-02-27 MED ORDER — NEOMYCIN-POLYMYXIN-DEXAMETH 3.5-10000-0.1 OP SUSP
OPHTHALMIC | Status: AC
  Filled 2015-02-27: qty 5

## 2015-02-27 MED ORDER — LIDOCAINE HCL (PF) 1 % IJ SOLN
INTRAMUSCULAR | Status: AC
  Filled 2015-02-27: qty 2

## 2015-02-27 MED ORDER — CYCLOPENTOLATE-PHENYLEPHRINE OP SOLN OPTIME - NO CHARGE
OPHTHALMIC | Status: AC
  Filled 2015-02-27: qty 2

## 2015-02-27 MED ORDER — LIDOCAINE HCL 3.5 % OP GEL
OPHTHALMIC | Status: AC
  Filled 2015-02-27: qty 1

## 2015-02-27 MED ORDER — TETRACAINE HCL 0.5 % OP SOLN
OPHTHALMIC | Status: AC
  Filled 2015-02-27: qty 2

## 2015-02-28 ENCOUNTER — Ambulatory Visit (HOSPITAL_COMMUNITY): Payer: Medicare Other | Admitting: Anesthesiology

## 2015-02-28 ENCOUNTER — Encounter (HOSPITAL_COMMUNITY): Payer: Self-pay | Admitting: Emergency Medicine

## 2015-02-28 ENCOUNTER — Ambulatory Visit (HOSPITAL_COMMUNITY)
Admission: RE | Admit: 2015-02-28 | Discharge: 2015-02-28 | Disposition: A | Discharge status: Home / Self Care | Payer: Medicare Other | Source: Ambulatory Visit | Attending: Ophthalmology | Admitting: Ophthalmology

## 2015-02-28 ENCOUNTER — Encounter (HOSPITAL_COMMUNITY)
Admission: RE | Disposition: A | Discharge status: Home / Self Care | Payer: Self-pay | Source: Ambulatory Visit | Attending: Ophthalmology

## 2015-02-28 DIAGNOSIS — H25812 Combined forms of age-related cataract, left eye: Secondary | ICD-10-CM | POA: Insufficient documentation

## 2015-02-28 DIAGNOSIS — H259 Unspecified age-related cataract: Secondary | ICD-10-CM | POA: Diagnosis not present

## 2015-02-28 HISTORY — PX: CATARACT EXTRACTION W/PHACO: SHX586

## 2015-02-28 SURGERY — PHACOEMULSIFICATION, CATARACT, WITH IOL INSERTION
Anesthesia: Monitor Anesthesia Care | Site: Eye | Laterality: Left

## 2015-02-28 MED ORDER — PROVISC 10 MG/ML IO SOLN
INTRAOCULAR | Status: DC | PRN
  Administered 2015-02-28: 0.85 mL via INTRAOCULAR

## 2015-02-28 MED ORDER — EPINEPHRINE HCL 1 MG/ML IJ SOLN
INTRAMUSCULAR | Status: AC
  Filled 2015-02-28: qty 1

## 2015-02-28 MED ORDER — MIDAZOLAM HCL 2 MG/2ML IJ SOLN
INTRAMUSCULAR | Status: AC
  Filled 2015-02-28: qty 2

## 2015-02-28 MED ORDER — LIDOCAINE HCL 3.5 % OP GEL
1.0000 "application " | Freq: Once | OPHTHALMIC | Status: AC
  Administered 2015-02-28: 1 via OPHTHALMIC

## 2015-02-28 MED ORDER — FENTANYL CITRATE (PF) 100 MCG/2ML IJ SOLN
INTRAMUSCULAR | Status: AC
  Filled 2015-02-28: qty 2

## 2015-02-28 MED ORDER — LIDOCAINE HCL (PF) 1 % IJ SOLN
INTRAMUSCULAR | Status: DC | PRN
  Administered 2015-02-28: .6 mL

## 2015-02-28 MED ORDER — SODIUM CHLORIDE 0.9 % IJ SOLN
INTRAMUSCULAR | Status: AC
  Filled 2015-02-28: qty 3

## 2015-02-28 MED ORDER — TETRACAINE HCL 0.5 % OP SOLN
1.0000 [drp] | OPHTHALMIC | Status: AC
Start: 2015-02-28 — End: 2015-02-28
  Administered 2015-02-28 (×3): 1 [drp] via OPHTHALMIC

## 2015-02-28 MED ORDER — ONDANSETRON HCL 4 MG/2ML IJ SOLN
4.0000 mg | Freq: Once | INTRAMUSCULAR | Status: AC
  Administered 2015-02-28: 4 mg via INTRAVENOUS

## 2015-02-28 MED ORDER — NEOMYCIN-POLYMYXIN-DEXAMETH 3.5-10000-0.1 OP SUSP
OPHTHALMIC | Status: DC | PRN
  Administered 2015-02-28: 2 [drp] via OPHTHALMIC

## 2015-02-28 MED ORDER — CYCLOPENTOLATE-PHENYLEPHRINE 0.2-1 % OP SOLN
1.0000 [drp] | OPHTHALMIC | Status: AC
  Administered 2015-02-28 (×3): 1 [drp] via OPHTHALMIC

## 2015-02-28 MED ORDER — BSS IO SOLN
INTRAOCULAR | Status: DC | PRN
  Administered 2015-02-28: 15 mL

## 2015-02-28 MED ORDER — ONDANSETRON HCL 4 MG/2ML IJ SOLN
INTRAMUSCULAR | Status: AC
  Filled 2015-02-28: qty 2

## 2015-02-28 MED ORDER — POVIDONE-IODINE 5 % OP SOLN
OPHTHALMIC | Status: DC | PRN
  Administered 2015-02-28: 1 via OPHTHALMIC

## 2015-02-28 MED ORDER — EPINEPHRINE HCL 1 MG/ML IJ SOLN
INTRAOCULAR | Status: DC | PRN
  Administered 2015-02-28: 500 mL

## 2015-02-28 MED ORDER — MIDAZOLAM HCL 2 MG/2ML IJ SOLN
1.0000 mg | INTRAMUSCULAR | Status: DC | PRN
  Administered 2015-02-28: 2 mg via INTRAVENOUS

## 2015-02-28 MED ORDER — PHENYLEPHRINE HCL 2.5 % OP SOLN
1.0000 [drp] | OPHTHALMIC | Status: AC
  Administered 2015-02-28 (×3): 1 [drp] via OPHTHALMIC

## 2015-02-28 MED ORDER — LACTATED RINGERS IV SOLN
INTRAVENOUS | Status: DC
  Administered 2015-02-28: 75 mL/h via INTRAVENOUS

## 2015-02-28 MED ORDER — FENTANYL CITRATE (PF) 100 MCG/2ML IJ SOLN
25.0000 ug | INTRAMUSCULAR | Status: AC
  Administered 2015-02-28 (×2): 25 ug via INTRAVENOUS

## 2015-02-28 SURGICAL SUPPLY — 34 items
CAPSULAR TENSION RING-AMO (OPHTHALMIC RELATED) IMPLANT
CLOTH BEACON ORANGE TIMEOUT ST (SAFETY) ×2 IMPLANT
EYE SHIELD UNIVERSAL CLEAR (GAUZE/BANDAGES/DRESSINGS) ×2 IMPLANT
GLOVE BIO SURGEON STRL SZ 6.5 (GLOVE) IMPLANT
GLOVE BIO SURGEONS STRL SZ 6.5 (GLOVE)
GLOVE BIOGEL PI IND STRL 6.5 (GLOVE) IMPLANT
GLOVE BIOGEL PI IND STRL 7.0 (GLOVE) IMPLANT
GLOVE BIOGEL PI IND STRL 7.5 (GLOVE) IMPLANT
GLOVE BIOGEL PI INDICATOR 6.5 (GLOVE)
GLOVE BIOGEL PI INDICATOR 7.0 (GLOVE) ×4
GLOVE BIOGEL PI INDICATOR 7.5 (GLOVE)
GLOVE ECLIPSE 6.5 STRL STRAW (GLOVE) IMPLANT
GLOVE ECLIPSE 7.0 STRL STRAW (GLOVE) IMPLANT
GLOVE ECLIPSE 7.5 STRL STRAW (GLOVE) IMPLANT
GLOVE EXAM NITRILE LRG STRL (GLOVE) IMPLANT
GLOVE EXAM NITRILE MD LF STRL (GLOVE) IMPLANT
GLOVE SKINSENSE NS SZ6.5 (GLOVE)
GLOVE SKINSENSE NS SZ7.0 (GLOVE)
GLOVE SKINSENSE STRL SZ6.5 (GLOVE) IMPLANT
GLOVE SKINSENSE STRL SZ7.0 (GLOVE) IMPLANT
KIT VITRECTOMY (OPHTHALMIC RELATED) IMPLANT
PAD ARMBOARD 7.5X6 YLW CONV (MISCELLANEOUS) ×2 IMPLANT
PROC W NO LENS (INTRAOCULAR LENS)
PROC W SPEC LENS (INTRAOCULAR LENS)
PROCESS W NO LENS (INTRAOCULAR LENS) IMPLANT
PROCESS W SPEC LENS (INTRAOCULAR LENS) IMPLANT
RETRACTOR IRIS SIGHTPATH (OPHTHALMIC RELATED) IMPLANT
RING MALYGIN (MISCELLANEOUS) IMPLANT
SIGHTPATH CAT PROC W REG LENS (Ophthalmic Related) ×3 IMPLANT
SYRINGE LUER LOK 1CC (MISCELLANEOUS) ×2 IMPLANT
TAPE SURG TRANSPORE 1 IN (GAUZE/BANDAGES/DRESSINGS) IMPLANT
TAPE SURGICAL TRANSPORE 1 IN (GAUZE/BANDAGES/DRESSINGS) ×2
VISCOELASTIC ADDITIONAL (OPHTHALMIC RELATED) IMPLANT
WATER STERILE IRR 250ML POUR (IV SOLUTION) ×2 IMPLANT

## 2015-02-28 NOTE — Op Note (Signed)
Date of Admission: 02/28/2015  Date of Surgery: 02/28/2015   Pre-Op Dx: Cataract Left Eye  Post-Op Dx: Senile Combined Cataract Left  Eye,  Dx Code [id number]  Surgeon: [name], M.D.  Assistants: None  Anesthesia: Topical with MAC  Indications: Painless, progressive loss of vision with compromise of daily activities.  Surgery: Cataract Extraction with Intraocular lens Implant Left Eye  Discription: The patient had dilating drops and viscous lidocaine placed into the Left eye in the pre-op holding area. After transfer to the operating room, a time out was performed. The patient was then prepped and draped. Beginning with a 34 degree blade a paracentesis port was made at the surgeon's 2 o'clock position. The anterior chamber was then filled with 1% non-preserved lidocaine. This was followed by filling the anterior chamber with Provisc.  A 2.47mm keratome blade was used to make a clear corneal incision at the temporal limbus.  A bent cystatome needle was used to create a continuous tear capsulotomy. Hydrodissection was performed with balanced salt solution on a Fine canula. The lens nucleus was then removed using the phacoemulsification handpiece. Residual cortex was removed with the I&A handpiece. The anterior chamber and capsular bag were refilled with Provisc. A posterior chamber intraocular lens was placed into the capsular bag with it's injector. The implant was positioned with the Kuglan hook. The Provisc was then removed from the anterior chamber and capsular bag with the I&A handpiece. Stromal hydration of the main incision and paracentesis port was performed with BSS on a Fine canula. The wounds were tested for leak which was negative. The patient tolerated the procedure well. There were no operative complications. The patient was then transferred to the recovery room in stable condition.  Complications: None  Specimen: None  EBL: None  Prosthetic device: Hoya iSert 250, power 19.0 D, SN  [id number].

## 2015-02-28 NOTE — Anesthesia Postprocedure Evaluation (Signed)
  Anesthesia Post-op Note  Patient: Kelsey Sandoval  Procedure(s) Performed: Procedure(s) with comments: CATARACT EXTRACTION PHACO AND INTRAOCULAR LENS PLACEMENT (IOC) (Left) - CDE 18.71  Patient Location: Short Stay  Anesthesia Type:MAC  Level of Consciousness: awake, alert , oriented and patient cooperative  Airway and Oxygen Therapy: Patient Spontanous Breathing  Post-op Pain: none  Post-op Assessment: Post-op Vital signs reviewed, Patient's Cardiovascular Status Stable, Respiratory Function Stable, Patent Airway, No signs of Nausea or vomiting and Pain level controlled  Post-op Vital Signs: Reviewed and stable  Last Vitals:  Filed Vitals:   02/28/15 0944  BP: 140/68  Pulse:   Temp:   Resp: 0    Complications: No apparent anesthesia complications

## 2015-02-28 NOTE — Anesthesia Procedure Notes (Signed)
Procedure Name: MAC Date/Time: 02/28/2015 9:50 AM Performed by: Andree Elk, AMY A Pre-anesthesia Checklist: Patient identified, Timeout performed, Emergency Drugs available, Suction available and Patient being monitored Oxygen Delivery Method: Nasal cannula

## 2015-02-28 NOTE — Anesthesia Preprocedure Evaluation (Signed)
Anesthesia Evaluation  Patient identified by MRN, date of birth, ID band Patient awake    Reviewed: Allergy & Precautions, NPO status , Patient's Chart, lab work & pertinent test results, reviewed documented beta blocker date and time   Airway Mallampati: II  TM Distance: >3 FB     Dental  (+) Edentulous Upper, Partial Lower   Pulmonary neg pulmonary ROS,  breath sounds clear to auscultation        Cardiovascular hypertension, Pt. on medications and Pt. on home beta blockers + dysrhythmias Atrial Fibrillation + pacemaker Rhythm:Regular Rate:Normal     Neuro/Psych PSYCHIATRIC DISORDERS Anxiety Depression    GI/Hepatic   Endo/Other  diabetesHypothyroidism   Renal/GU Renal disease     Musculoskeletal  (+) Arthritis -,   Abdominal   Peds  Hematology  (+) anemia ,   Anesthesia Other Findings   Reproductive/Obstetrics                             Anesthesia Physical Anesthesia Plan  ASA: III  Anesthesia Plan: MAC   Post-op Pain Management:    Induction: Intravenous  Airway Management Planned: Nasal Cannula  Additional Equipment:   Intra-op Plan:   Post-operative Plan:   Informed Consent: I have reviewed the patients History and Physical, chart, labs and discussed the procedure including the risks, benefits and alternatives for the proposed anesthesia with the patient or authorized representative who has indicated his/her understanding and acceptance.     Plan Discussed with:   Anesthesia Plan Comments:         Anesthesia Quick Evaluation

## 2015-02-28 NOTE — Discharge Instructions (Signed)

## 2015-02-28 NOTE — Transfer of Care (Signed)
Immediate Anesthesia Transfer of Care Note  Patient: Kelsey Sandoval  Procedure(s) Performed: Procedure(s) with comments: CATARACT EXTRACTION PHACO AND INTRAOCULAR LENS PLACEMENT (IOC) (Left) - CDE 18.71  Patient Location: Short stay  Anesthesia Type:MAC  Level of Consciousness: awake, alert , oriented and patient cooperative  Airway & Oxygen Therapy: Patient Spontanous Breathing  Post-op Assessment: Report given to RN and Post -op Vital signs reviewed and stable  Post vital signs: Reviewed and stable  Last Vitals:  Filed Vitals:   02/28/15 0944  BP: 140/68  Pulse:   Temp:   Resp: 0    Complications: No apparent anesthesia complications

## 2015-02-28 NOTE — H&P (Signed)
I have reviewed the H&P, the patient was re-examined, and I have identified no interval changes in medical condition and plan of care since the history and physical of record  

## 2015-03-01 ENCOUNTER — Encounter (HOSPITAL_COMMUNITY): Payer: Self-pay | Admitting: Ophthalmology

## 2015-03-04 ENCOUNTER — Other Ambulatory Visit: Payer: Self-pay | Admitting: Cardiovascular Disease

## 2015-03-04 ENCOUNTER — Other Ambulatory Visit: Payer: Self-pay | Admitting: Family Medicine

## 2015-03-05 ENCOUNTER — Telehealth: Payer: Self-pay | Admitting: Cardiovascular Disease

## 2015-03-05 MED ORDER — DABIGATRAN ETEXILATE MESYLATE 150 MG PO CAPS: 150.0000 mg | ORAL_CAPSULE | Freq: Two times a day (BID) | ORAL | Status: DC

## 2015-03-05 NOTE — Telephone Encounter (Signed)
°  1. Which medications need to be refilled? Pradaxa  2. Which pharmacy is medication to be sent to?[facility] in [place]   3. Do they need a 30 day or 90 day supply? 90      4. Would they like a call back once the medication has been sent to the pharmacy? yes

## 2015-03-05 NOTE — Telephone Encounter (Signed)
Rx(s) sent to pharmacy electronically. Patient's daughter notified.

## 2015-03-07 ENCOUNTER — Inpatient Hospital Stay (HOSPITAL_COMMUNITY)
Admission: EM | Admit: 2015-03-07 | Discharge: 2015-03-11 | DRG: 881 | Disposition: A | Discharge status: Home Health | Payer: Medicare Other | Attending: Internal Medicine | Admitting: Internal Medicine

## 2015-03-07 ENCOUNTER — Telehealth: Payer: Self-pay | Admitting: *Deleted

## 2015-03-07 ENCOUNTER — Encounter (HOSPITAL_COMMUNITY): Payer: Self-pay

## 2015-03-07 ENCOUNTER — Other Ambulatory Visit (HOSPITAL_COMMUNITY): Payer: Self-pay

## 2015-03-07 ENCOUNTER — Emergency Department (HOSPITAL_COMMUNITY): Payer: Medicare Other

## 2015-03-07 DIAGNOSIS — N049 Nephrotic syndrome with unspecified morphologic changes: Secondary | ICD-10-CM | POA: Diagnosis present

## 2015-03-07 DIAGNOSIS — J189 Pneumonia, unspecified organism: Secondary | ICD-10-CM | POA: Diagnosis present

## 2015-03-07 DIAGNOSIS — K838 Other specified diseases of biliary tract: Secondary | ICD-10-CM | POA: Diagnosis not present

## 2015-03-07 DIAGNOSIS — D649 Anemia, unspecified: Secondary | ICD-10-CM | POA: Diagnosis present

## 2015-03-07 DIAGNOSIS — F32A Depression, unspecified: Secondary | ICD-10-CM | POA: Diagnosis present

## 2015-03-07 DIAGNOSIS — I480 Paroxysmal atrial fibrillation: Secondary | ICD-10-CM | POA: Diagnosis present

## 2015-03-07 DIAGNOSIS — F329 Major depressive disorder, single episode, unspecified: Secondary | ICD-10-CM | POA: Diagnosis not present

## 2015-03-07 DIAGNOSIS — R140 Abdominal distension (gaseous): Secondary | ICD-10-CM | POA: Diagnosis not present

## 2015-03-07 DIAGNOSIS — R112 Nausea with vomiting, unspecified: Secondary | ICD-10-CM | POA: Diagnosis not present

## 2015-03-07 DIAGNOSIS — I10 Essential (primary) hypertension: Secondary | ICD-10-CM | POA: Diagnosis present

## 2015-03-07 DIAGNOSIS — R1110 Vomiting, unspecified: Secondary | ICD-10-CM | POA: Diagnosis not present

## 2015-03-07 DIAGNOSIS — R1111 Vomiting without nausea: Secondary | ICD-10-CM | POA: Diagnosis not present

## 2015-03-07 DIAGNOSIS — G629 Polyneuropathy, unspecified: Secondary | ICD-10-CM | POA: Diagnosis present

## 2015-03-07 DIAGNOSIS — Z800 Family history of malignant neoplasm of digestive organs: Secondary | ICD-10-CM | POA: Diagnosis not present

## 2015-03-07 DIAGNOSIS — Z950 Presence of cardiac pacemaker: Secondary | ICD-10-CM | POA: Diagnosis present

## 2015-03-07 DIAGNOSIS — E785 Hyperlipidemia, unspecified: Secondary | ICD-10-CM | POA: Diagnosis present

## 2015-03-07 DIAGNOSIS — B9689 Other specified bacterial agents as the cause of diseases classified elsewhere: Secondary | ICD-10-CM | POA: Diagnosis present

## 2015-03-07 DIAGNOSIS — R918 Other nonspecific abnormal finding of lung field: Secondary | ICD-10-CM | POA: Diagnosis not present

## 2015-03-07 DIAGNOSIS — R1030 Lower abdominal pain, unspecified: Secondary | ICD-10-CM | POA: Diagnosis not present

## 2015-03-07 DIAGNOSIS — E440 Moderate protein-calorie malnutrition: Secondary | ICD-10-CM | POA: Diagnosis present

## 2015-03-07 DIAGNOSIS — N390 Urinary tract infection, site not specified: Secondary | ICD-10-CM | POA: Diagnosis not present

## 2015-03-07 DIAGNOSIS — R52 Pain, unspecified: Secondary | ICD-10-CM

## 2015-03-07 DIAGNOSIS — E039 Hypothyroidism, unspecified: Secondary | ICD-10-CM | POA: Diagnosis present

## 2015-03-07 DIAGNOSIS — R634 Abnormal weight loss: Secondary | ICD-10-CM | POA: Diagnosis not present

## 2015-03-07 DIAGNOSIS — K819 Cholecystitis, unspecified: Secondary | ICD-10-CM

## 2015-03-07 DIAGNOSIS — K5730 Diverticulosis of large intestine without perforation or abscess without bleeding: Secondary | ICD-10-CM | POA: Diagnosis not present

## 2015-03-07 DIAGNOSIS — R42 Dizziness and giddiness: Secondary | ICD-10-CM | POA: Diagnosis not present

## 2015-03-07 DIAGNOSIS — D509 Iron deficiency anemia, unspecified: Secondary | ICD-10-CM | POA: Diagnosis not present

## 2015-03-07 DIAGNOSIS — R6881 Early satiety: Secondary | ICD-10-CM | POA: Diagnosis not present

## 2015-03-07 LAB — URINE MICROSCOPIC-ADD ON

## 2015-03-07 LAB — CBC WITH DIFFERENTIAL/PLATELET
BASOS PCT: 0 % (ref 0–1)
Basophils Absolute: 0 10*3/uL (ref 0.0–0.1)
Eosinophils Absolute: 0 10*3/uL (ref 0.0–0.7)
Eosinophils Relative: 1 % (ref 0–5)
HCT: 36.1 % (ref 36.0–46.0)
HEMOGLOBIN: 12.6 g/dL (ref 12.0–15.0)
LYMPHS PCT: 25 % (ref 12–46)
Lymphs Abs: 1.9 10*3/uL (ref 0.7–4.0)
MCH: 32 pg (ref 26.0–34.0)
MCHC: 34.9 g/dL (ref 30.0–36.0)
MCV: 91.6 fL (ref 78.0–100.0)
MONO ABS: 0.4 10*3/uL (ref 0.1–1.0)
Monocytes Relative: 5 % (ref 3–12)
NEUTROS PCT: 69 % (ref 43–77)
Neutro Abs: 5.4 10*3/uL (ref 1.7–7.7)
Platelets: 246 10*3/uL (ref 150–400)
RBC: 3.94 MIL/uL (ref 3.87–5.11)
RDW: 12.6 % (ref 11.5–15.5)
WBC: 7.8 10*3/uL (ref 4.0–10.5)

## 2015-03-07 LAB — COMPREHENSIVE METABOLIC PANEL
ALBUMIN: 3.1 g/dL — AB (ref 3.5–5.0)
ALK PHOS: 67 U/L (ref 38–126)
ALT: 16 U/L (ref 14–54)
ANION GAP: 10 (ref 5–15)
AST: 26 U/L (ref 15–41)
BUN: 16 mg/dL (ref 6–20)
CHLORIDE: 108 mmol/L (ref 101–111)
CO2: 23 mmol/L (ref 22–32)
Calcium: 8.6 mg/dL — ABNORMAL LOW (ref 8.9–10.3)
Creatinine, Ser: 1.33 mg/dL — ABNORMAL HIGH (ref 0.44–1.00)
GFR calc Af Amer: 43 mL/min — ABNORMAL LOW (ref >60)
GFR calc non Af Amer: 37 mL/min — ABNORMAL LOW (ref >60)
Glucose, Bld: 120 mg/dL — ABNORMAL HIGH (ref 65–99)
POTASSIUM: 3.6 mmol/L (ref 3.5–5.1)
Sodium: 141 mmol/L (ref 135–145)
TOTAL PROTEIN: 5.7 g/dL — AB (ref 6.5–8.1)
Total Bilirubin: 0.4 mg/dL (ref 0.3–1.2)

## 2015-03-07 LAB — URINALYSIS, ROUTINE W REFLEX MICROSCOPIC
Bilirubin Urine: NEGATIVE
GLUCOSE, UA: NEGATIVE mg/dL
HGB URINE DIPSTICK: NEGATIVE
Ketones, ur: NEGATIVE mg/dL
Leukocytes, UA: NEGATIVE
Nitrite: NEGATIVE
PH: 6.5 (ref 5.0–8.0)
Protein, ur: >300 mg/dL — AB
SPECIFIC GRAVITY, URINE: 1.02 (ref 1.005–1.030)
Urobilinogen, UA: 0.2 mg/dL (ref 0.0–1.0)

## 2015-03-07 LAB — TROPONIN I

## 2015-03-07 MED ORDER — CIPROFLOXACIN IN D5W 400 MG/200ML IV SOLN
400.0000 mg | Freq: Once | INTRAVENOUS | Status: AC
  Administered 2015-03-07: 400 mg via INTRAVENOUS
  Filled 2015-03-07: qty 200

## 2015-03-07 MED ORDER — IOHEXOL 300 MG/ML  SOLN
100.0000 mL | Freq: Once | INTRAMUSCULAR | Status: AC | PRN
  Administered 2015-03-07: 80 mL via INTRAVENOUS

## 2015-03-07 MED ORDER — IOHEXOL 300 MG/ML  SOLN
25.0000 mL | Freq: Once | INTRAMUSCULAR | Status: AC | PRN
  Administered 2015-03-07: 25 mL via ORAL

## 2015-03-07 MED ORDER — ONDANSETRON HCL 4 MG/2ML IJ SOLN
4.0000 mg | Freq: Once | INTRAMUSCULAR | Status: AC
  Administered 2015-03-07: 4 mg via INTRAVENOUS
  Filled 2015-03-07: qty 2

## 2015-03-07 NOTE — ED Notes (Signed)
Pt reports hypertension, extreme fatigue, and nausea since Sunday. Denies chest pain, denies vomiting.

## 2015-03-07 NOTE — ED Notes (Signed)
Pt reports had cataract surgery last Thursday.

## 2015-03-07 NOTE — H&P (Signed)
PCP:   Sallee Lange, MD   Chief Complaint:  Nausea Fatigue  HPI:  81 year old female who  has a past medical history of Hypertension; Dysrhythmia; Anxiety; Depression; Pacemaker; Arthritis; Hyperlipidemia; Pre-diabetes; Osteopenia; Atrial fibrillation; Back pain, chronic; Cataracts, bilateral; Urine protein increased; and Nephrotic syndrome. Today comes to the hospital with chief complaint of nausea and fatigue which has been going on for past 3 days. Patient says that this problem has been going on for many months intermittently but over the past 3 days she has become so fatigued that she has no energy. She also has intermittent vomiting. This time symptoms started after she ate hamburger on Friday and started experiencing nausea with fatigue on Saturday. She denies chest pain, no shortness of breath no coughing. No diarrhea. Denies fever, no dysuria urgency or frequency of urination. In the ED CT scan of the abdomen and pelvis was done which shows left lung nodule consistent with atypical infection versus fungal infection. Also shows thickening of the gallbladder with mild dilation of the CBD. Patient liver functions are normal today.  Patient recently started on rituximab for nephritis at [facility].  Allergies:   Allergies  Allergen Reactions  . Penicillins Other (See Comments)    Caused patient to pass out.Can take cephalosporins  . Levaquin [Levofloxacin] Nausea Only  . Sulfa Antibiotics Other (See Comments)    Unknown  . Zithromax [Azithromycin] Nausea Only and Rash      Past Medical History  Diagnosis Date  . Hypertension   . Dysrhythmia   . Anxiety   . Depression   . Pacemaker   . Arthritis   . Hyperlipidemia   . Pre-diabetes   . Osteopenia   . Atrial fibrillation   . Back pain, chronic   . Cataracts, bilateral   . Urine protein increased   . Nephrotic syndrome     RHUX    Past Surgical History  Procedure Laterality Date  . Insert / replace / remove pacemaker      . Appendectomy    . Abdominal hysterectomy      partial-pt has no ovaries  . Back surgery    . Colonoscopy  9/05  . Dual chamber pacemaker      2012  . Colonoscopy N/A 10/11/2013    Procedure: COLONOSCOPY;  Surgeon: Rogene Houston, MD;  Location: [facility];  Service: Endoscopy;  Laterality: N/A;  1200  . Esophagogastroduodenoscopy N/A 01/17/2014    Procedure: ESOPHAGOGASTRODUODENOSCOPY (EGD);  Surgeon: Rogene Houston, MD;  Location: [facility];  Service: Endoscopy;  Laterality: N/A;  230  . Esophageal biopsy N/A 01/17/2014    Procedure: BIOPSY;  Surgeon: Rogene Houston, MD;  Location: [facility];  Service: Endoscopy;  Laterality: N/A;  . Wisdom tooth extracton    . Cataract extraction w/phaco Left 02/28/2015    Procedure: CATARACT EXTRACTION PHACO AND INTRAOCULAR LENS PLACEMENT (IOC);  Surgeon: [name], MD;  Location: [facility];  Service: Ophthalmology;  Laterality: Left;  CDE 18.71    Prior to Admission medications   Medication Sig Start Date End Date Taking? Authorizing Provider  albuterol (PROVENTIL HFA;VENTOLIN HFA) 108 (90 BASE) MCG/ACT inhaler Inhale 2 puffs into the lungs every 6 (six) hours as needed for wheezing or shortness of breath. 09/17/14  Yes Kathyrn Drown, MD  ALPRAZolam Duanne Moron) 1 MG tablet Take 1 tablet (1 mg total) by mouth 2 (two) times daily as needed for anxiety. 09/17/14  Yes Kathyrn Drown, MD  amLODipine ([place])  10 MG tablet TAKE ONE TABLET BY MOUTH ONCE DAILY 01/07/15  Yes Kathyrn Drown, MD  dabigatran (PRADAXA) 150 MG CAPS capsule Take 1 capsule (150 mg total) by mouth 2 (two) times daily. 03/05/15  Yes Mihai Croitoru, MD  DUREZOL 0.05 % EMUL Place 1 drop into the left eye 3 (three) times daily. 02/22/15  Yes Historical Provider, MD  flecainide (TAMBOCOR) 50 MG tablet Take 1 tablet (50 mg total) by mouth 2 (two) times daily. 06/07/14  Yes Pixie Casino, MD  HYDROcodone-acetaminophen (NORCO/VICODIN) 5-325 MG per tablet Take 1 tablet by mouth every 4  (four) hours as needed for moderate pain. 12/18/14  Yes Kathyrn Drown, MD  levothyroxine (LEVOTHROID) 25 MCG tablet Take 1 tablet (25 mcg total) by mouth daily before breakfast. 02/14/15  Yes Kathyrn Drown, MD  lisinopril (PRINIVIL,ZESTRIL) 40 MG tablet TAKE ONE TABLET BY MOUTH ONCE DAILY 01/07/15  Yes Kathyrn Drown, MD  metoprolol succinate (TOPROL-XL) 100 MG 24 hr tablet Take 100 mg by mouth daily. Take with or immediately following a meal.   Yes Historical Provider, MD  metoprolol succinate (TOPROL-XL) 50 MG 24 hr tablet TAKE ONE TABLET BY MOUTH ONCE DAILY -TAKE  WITH  OR  IMMEDIATELY  FOLLOWING  A  MEAL 01/07/15  Yes Kathyrn Drown, MD  omeprazole (PRILOSEC) 20 MG capsule Take 1 capsule (20 mg total) by mouth 2 (two) times daily before a meal. 12/18/14  Yes Scott A Luking, MD  ondansetron (ZOFRAN-ODT) 8 MG disintegrating tablet DISSOLVE ONE TABLET IN MOUTH EVERY 8 HOURS AS NEEDED FOR NAUSEA OR VOMITING 03/05/15  Yes Kathyrn Drown, MD  pravastatin (PRAVACHOL) 80 MG tablet Take 1 tablet (80 mg total) by mouth daily. 02/23/14  Yes Kathyrn Drown, MD  PROLENSA 0.07 % SOLN Place 1 drop into the left eye daily. 02/22/15  Yes Historical Provider, MD  riTUXimab in sodium chloride 0.9 % 250 mL Inject into the vein once. Infusion 2 weeks apart: May repeat in 6 months   Yes Historical Provider, MD  tobramycin (TOBREX) 0.3 % ophthalmic solution Place 1 drop into the left eye 4 (four) times daily. 02/22/15  Yes Historical Provider, MD  vitamin B-12 (CYANOCOBALAMIN) 1000 MCG tablet Take 1,000 mcg by mouth 2 (two) times daily.   Yes Historical Provider, MD    Social History:  reports that she has never smoked. She has never used smokeless tobacco. She reports that she does not drink alcohol or use illicit drugs.  Family History  Problem Relation Age of Onset  . Colon cancer Brother      All the positives are listed in BOLD  Review of Systems:  HEENT: Headache, blurred vision, runny nose, sore throat Neck:  Hypothyroidism, hyperthyroidism,,lymphadenopathy Chest : Shortness of breath, history of COPD, Asthma Heart : Chest pain, history of coronary arterey disease GI:  Nausea, vomiting, diarrhea, constipation, GERD GU: Dysuria, urgency, frequency of urination, hematuria Neuro: Stroke, seizures, syncope Psych: Depression, anxiety, hallucinations   Physical Exam: Blood pressure 107/92, pulse 80, temperature 98.3 F (36.8 C), temperature source Oral, resp. rate 19, height 5\' 9"  (1.753 m), weight 68.04 kg (150 lb), SpO2 99 %. Constitutional:   Patient is a well-developed and well-nourished female* in no acute distress and cooperative with exam. Head: Normocephalic and atraumatic Mouth: Mucus membranes moist Eyes: PERRL, EOMI, conjunctivae normal Neck: Supple, No Thyromegaly Cardiovascular: RRR, S1 normal, S2 normal Pulmonary/Chest: CTAB, no wheezes, rales, or rhonchi Abdominal: Soft. Non-tender, non-distended, bowel sounds are normal,  no masses, organomegaly, or guarding present.  Neurological: A&O x3, Strength is normal and symmetric bilaterally, cranial nerve II-XII are grossly intact, no focal motor deficit, sensory intact to light touch bilaterally.  Extremities : No Cyanosis, Clubbing or Edema  Labs on Admission:  Basic Metabolic Panel:  Recent Labs Lab 03/07/15 1832  NA 141  K 3.6  CL 108  CO2 23  GLUCOSE 120*  BUN 16  CREATININE 1.33*  CALCIUM 8.6*   Liver Function Tests:  Recent Labs Lab 03/07/15 1832  AST 26  ALT 16  ALKPHOS 67  BILITOT 0.4  PROT 5.7*  ALBUMIN 3.1*   No results for input(s): LIPASE, AMYLASE in the last 168 hours. No results for input(s): AMMONIA in the last 168 hours. CBC:  Recent Labs Lab 03/07/15 1832  WBC 7.8  NEUTROABS 5.4  HGB 12.6  HCT 36.1  MCV 91.6  PLT 246   Cardiac Enzymes:  Recent Labs Lab 03/07/15 1832  TROPONINI <0.03     Radiological Exams on Admission: Dg Chest 2 View  03/07/2015   CLINICAL DATA:  Intermittent  fatigue.  Hypertension.  Nausea.  EXAM: CHEST  2 VIEW  COMPARISON:  01/07/2015; 07/13/2013  FINDINGS: Grossly unchanged borderline enlarged cardiac silhouette and mediastinal contours with atherosclerotic plaque within the thoracic aorta. Stable position of support apparatus. The lungs remain hyperexpanded. Left basilar heterogeneous opacities are unchanged and favored to represent atelectasis or scar. No new focal airspace opacities. No pleural effusion pneumothorax. No evidence of edema. Stigmata of DISH within the caudal aspect of the thoracic spine.  IMPRESSION: Similar findings of borderline cardiomegaly and lung hyperexpansion without acute cardiopulmonary disease.   Electronically Signed   By: Sandi Mariscal M.D.   On: 03/07/2015 19:21   Ct Abdomen Pelvis W Contrast  03/07/2015   CLINICAL DATA:  Hypertension. Fatigue and nausea. Renal insufficiency.  EXAM: CT ABDOMEN AND PELVIS WITH CONTRAST  TECHNIQUE: Multidetector CT imaging of the abdomen and pelvis was performed using the standard protocol following bolus administration of intravenous contrast.  CONTRAST:  22mL OMNIPAQUE IOHEXOL 300 MG/ML SOLN, 1mL OMNIPAQUE IOHEXOL 300 MG/ML SOLN  COMPARISON:  Multiple exams, including 05/17/2014  FINDINGS: Lower chest: Scattered small pulmonary nodules in the left lower lobe and lingula, somewhat clustered peripherally, measuring up to 4 mm in diameter.  Pacemaker leads observed.  Hepatobiliary: Borderline gallbladder wall thickening. Poor definition of the common bile duct which measures up to 9 mm diameter. This appears to taper in the vicinity of the ampulla.  Pancreas: Unremarkable  Spleen: Unremarkable  Adrenals/Urinary Tract: Unremarkable  Stomach/Bowel: Considerable sigmoid colon diverticulosis. There is a less degree of descending colon diverticulosis. Scattered air-fluid levels in nondilated loops of jejunum with borderline fold thickening.  Vascular/Lymphatic: Aortoiliac atherosclerotic vascular disease.   Reproductive: Uterus absent. 1.6 by 1.3 cm left adnexal cyst adjacent to the ovary. This has simple fluid density characteristics. Right ovary poorly seen.  Other: Trace free pelvic fluid.  Musculoskeletal: Degenerative disc disease with loss of disc height at L4-5. There is moderate right foraminal stenosis at L4-5 due to right foraminal disc protrusion, intervertebral spurring, and facet spurring. Chondrocalcinosis in the pubic symphysis and in the acetabular labrum.  IMPRESSION: 1. Clustered small pulmonary nodules in the left lower lobe and lingula, suspicious for atypical infectious process such as atypical infectious bronchiolitis or fungal disease. 2. Borderline gallbladder wall thickening. Reviewing the patient's labs, this may well be due to the patient' s hypoalbuminemia and hypoproteinemia, although inflammation can also cause gallbladder  wall thickening. 3. Poor definition of the common bile duct which is mildly dilated. This is of uncertain significance. The patient's bilirubin is not elevated thus a biliary obstructive process is unlikely. Without liver enzyme elevation I doubt that the patient has cholangitis. This mild extrahepatic biliary dilatation might simply be incidental. 4. Sigmoid colon and descending colon diverticulosis. There is a small amount of free pelvic fluid but no mesenteric edema to suggest current active diverticulitis. 5. Scattered air-fluid levels in nondilated loops of jejunum. There is borderline fold thickening in these loops. The possibility of proximal enteritis is raised. 6.  Aortoiliac atherosclerotic vascular disease. 7. Average size 1.5 cm left adnexal cyst appears simple and does not require further workup. 8. Degenerative disc disease and spondylosis causing moderate right foraminal stenosis at L4-5. 9. Chondrocalcinosis in the pelvis, suggesting CPPD arthropathy.   Electronically Signed   By: Van Clines M.D.   On: 03/07/2015 21:24    EKG: Independently  reviewed. Normal sinus rhythm   Assessment/Plan Active Problems:   Paroxysmal atrial fibrillation   Pacemaker   Hypothyroidism   Nausea & vomiting  Nausea and vomiting The patient's CT scan abdomen pelvis shows thickening of the biliary wall with mild dilation of the CBD. Patient has ongoing symptoms of nausea and vomiting for past many years. Will obtain abdominal ultrasound in a.m. Empirically start the patient on Cipro and Flagyl for possible cholecystitis.  Lung nodule CT abdomen pelvis also shows left lower lobe lung nodule consistent with atypical infection versus fungal infection. Patient will need to be seen by pulmonary as outpatient. Currently patient is not having any respiratory symptoms.  History of nephritis Patient was recently seen by [facility] and started on rituximab 2 weeks ago Will follow-up nephrologist at [facility].  History of atrial fibrillation Continue metoprolol 100 mg by mouth daily. Continue anti-correlation with Pradaxa.  History of hypertension Currently we'll hold amlodipine, lisinopril due to low blood pressure Continue metoprolol.  Hypothyroidism Patient's last TSH was elevated to 9.26, will repeat TSH, continue Synthroid 25 g by mouth daily    Code status:Full code  Family discussion: Admission, patients condition and plan of care including tests being ordered have been discussed with the patient and his son and daughter-in-law at bedside* who indicate understanding and agree with the plan and Code Status.   Time Spent on Admission: 60 min  [facility] Pager: [phone] 03/07/2015, 11:59 PM  If 7PM-7AM, please contact night-coverage  [email or web address]  Password [email or web address]

## 2015-03-07 NOTE — ED Provider Notes (Signed)
CSN: [id number]     Arrival date & time 03/07/15  1633 History   First MD Initiated Contact with Patient 03/07/15 1736     Chief Complaint  Patient presents with  . Nausea  . Fatigue     (Consider location/radiation/quality/duration/timing/severity/associated sxs/prior Treatment) Patient is a 81 y.o. female presenting with weakness. The history is provided by the patient (the pt complains of fatique and nauseau).  Weakness This is a new problem. The current episode started more than 2 days ago. The problem occurs constantly. The problem has not changed since onset.Associated symptoms include abdominal pain. Pertinent negatives include no chest pain and no headaches. Nothing aggravates the symptoms. Nothing relieves the symptoms.    Past Medical History  Diagnosis Date  . Hypertension   . Dysrhythmia   . Anxiety   . Depression   . Pacemaker   . Arthritis   . Hyperlipidemia   . Pre-diabetes   . Osteopenia   . Atrial fibrillation   . Back pain, chronic   . Cataracts, bilateral   . Urine protein increased   . Nephrotic syndrome     RHUX   Past Surgical History  Procedure Laterality Date  . Insert / replace / remove pacemaker    . Appendectomy    . Abdominal hysterectomy      partial-pt has no ovaries  . Back surgery    . Colonoscopy  9/05  . Dual chamber pacemaker      2012  . Colonoscopy N/A 10/11/2013    Procedure: COLONOSCOPY;  Surgeon: Rogene Houston, MD;  Location: [facility];  Service: Endoscopy;  Laterality: N/A;  1200  . Esophagogastroduodenoscopy N/A 01/17/2014    Procedure: ESOPHAGOGASTRODUODENOSCOPY (EGD);  Surgeon: Rogene Houston, MD;  Location: [facility];  Service: Endoscopy;  Laterality: N/A;  230  . Esophageal biopsy N/A 01/17/2014    Procedure: BIOPSY;  Surgeon: Rogene Houston, MD;  Location: [facility];  Service: Endoscopy;  Laterality: N/A;  . Wisdom tooth extracton    . Cataract extraction w/phaco Left 02/28/2015    Procedure: CATARACT  EXTRACTION PHACO AND INTRAOCULAR LENS PLACEMENT (IOC);  Surgeon: [name], MD;  Location: [facility];  Service: Ophthalmology;  Laterality: Left;  CDE 18.71   Family History  Problem Relation Age of Onset  . Colon cancer Brother    History  Substance Use Topics  . Smoking status: Never Smoker   . Smokeless tobacco: Never Used  . Alcohol Use: No   OB History    No data available     Review of Systems  Constitutional: Negative for appetite change and fatigue.  HENT: Negative for congestion, ear discharge and sinus pressure.   Eyes: Negative for discharge.  Respiratory: Negative for cough.   Cardiovascular: Negative for chest pain.  Gastrointestinal: Positive for nausea and abdominal pain. Negative for diarrhea.  Genitourinary: Negative for frequency and hematuria.  Musculoskeletal: Negative for back pain.  Skin: Negative for rash.  Neurological: Positive for weakness. Negative for seizures and headaches.  Psychiatric/Behavioral: Negative for hallucinations.      Allergies  Penicillins; Levaquin; Sulfa antibiotics; and Zithromax  Home Medications   Prior to Admission medications   Medication Sig Start Date End Date Taking? Authorizing Provider  albuterol (PROVENTIL HFA;VENTOLIN HFA) 108 (90 BASE) MCG/ACT inhaler Inhale 2 puffs into the lungs every 6 (six) hours as needed for wheezing or shortness of breath. 09/17/14  Yes Kathyrn Drown, MD  ALPRAZolam Duanne Moron) 1 MG tablet Take 1  tablet (1 mg total) by mouth 2 (two) times daily as needed for anxiety. 09/17/14  Yes Kathyrn Drown, MD  amLODipine (NORVASC) 10 MG tablet TAKE ONE TABLET BY MOUTH ONCE DAILY 01/07/15  Yes Kathyrn Drown, MD  dabigatran (PRADAXA) 150 MG CAPS capsule Take 1 capsule (150 mg total) by mouth 2 (two) times daily. 03/05/15  Yes Mihai Croitoru, MD  DUREZOL 0.05 % EMUL Place 1 drop into the left eye 3 (three) times daily. 02/22/15  Yes Historical Provider, MD  flecainide (TAMBOCOR) 50 MG tablet Take 1 tablet (50 mg  total) by mouth 2 (two) times daily. 06/07/14  Yes Pixie Casino, MD  HYDROcodone-acetaminophen (NORCO/VICODIN) 5-325 MG per tablet Take 1 tablet by mouth every 4 (four) hours as needed for moderate pain. 12/18/14  Yes Kathyrn Drown, MD  levothyroxine (LEVOTHROID) 25 MCG tablet Take 1 tablet (25 mcg total) by mouth daily before breakfast. 02/14/15  Yes Kathyrn Drown, MD  lisinopril (PRINIVIL,ZESTRIL) 40 MG tablet TAKE ONE TABLET BY MOUTH ONCE DAILY 01/07/15  Yes Kathyrn Drown, MD  metoprolol succinate (TOPROL-XL) 100 MG 24 hr tablet Take 100 mg by mouth daily. Take with or immediately following a meal.   Yes Historical Provider, MD  metoprolol succinate (TOPROL-XL) 50 MG 24 hr tablet TAKE ONE TABLET BY MOUTH ONCE DAILY -TAKE  WITH  OR  IMMEDIATELY  FOLLOWING  A  MEAL 01/07/15  Yes Kathyrn Drown, MD  omeprazole (PRILOSEC) 20 MG capsule Take 1 capsule (20 mg total) by mouth 2 (two) times daily before a meal. 12/18/14  Yes Scott A Luking, MD  ondansetron (ZOFRAN-ODT) 8 MG disintegrating tablet DISSOLVE ONE TABLET IN MOUTH EVERY 8 HOURS AS NEEDED FOR NAUSEA OR VOMITING 03/05/15  Yes Kathyrn Drown, MD  pravastatin (PRAVACHOL) 80 MG tablet Take 1 tablet (80 mg total) by mouth daily. 02/23/14  Yes Kathyrn Drown, MD  PROLENSA 0.07 % SOLN Place 1 drop into the left eye daily. 02/22/15  Yes Historical Provider, MD  riTUXimab in sodium chloride 0.9 % 250 mL Inject into the vein once. Infusion 2 weeks apart: May repeat in 6 months   Yes Historical Provider, MD  tobramycin (TOBREX) 0.3 % ophthalmic solution Place 1 drop into the left eye 4 (four) times daily. 02/22/15  Yes Historical Provider, MD  vitamin B-12 (CYANOCOBALAMIN) 1000 MCG tablet Take 1,000 mcg by mouth 2 (two) times daily.   Yes Historical Provider, MD   BP 168/89 mmHg  Pulse 78  Temp(Src) 98.3 F (36.8 C) (Oral)  Resp 18  Ht 5\' 9"  (1.753 m)  Wt 150 lb (68.04 kg)  BMI 22.14 kg/m2  SpO2 96% Physical Exam  Constitutional: She is oriented to person,  place, and time. She appears well-developed.  HENT:  Head: Normocephalic.  Eyes: Conjunctivae and EOM are normal. No scleral icterus.  Neck: Neck supple. No thyromegaly present.  Cardiovascular: Normal rate and regular rhythm.  Exam reveals no gallop and no friction rub.   No murmur heard. Pulmonary/Chest: No stridor. She has no wheezes. She has no rales. She exhibits no tenderness.  Abdominal: She exhibits no distension. There is tenderness. There is no rebound.  Minimal tenderness in lower abd  Musculoskeletal: Normal range of motion. She exhibits no edema.  Lymphadenopathy:    She has no cervical adenopathy.  Neurological: She is oriented to person, place, and time. She exhibits normal muscle tone. Coordination normal.  Skin: No rash noted. No erythema.  Psychiatric: She has  a normal mood and affect. Her behavior is normal.    ED Course  Procedures (including critical care time) Labs Review Labs Reviewed  COMPREHENSIVE METABOLIC PANEL - Abnormal; Notable for the following:    Glucose, Bld 120 (*)    Creatinine, Ser 1.33 (*)    Calcium 8.6 (*)    Total Protein 5.7 (*)    Albumin 3.1 (*)    GFR calc non Af Amer 37 (*)    GFR calc Af Amer 43 (*)    All other components within normal limits  URINALYSIS, ROUTINE W REFLEX MICROSCOPIC (NOT AT [facility]) - Abnormal; Notable for the following:    Protein, ur >300 (*)    All other components within normal limits  URINE MICROSCOPIC-ADD ON - Abnormal; Notable for the following:    Squamous Epithelial / LPF FEW (*)    All other components within normal limits  URINE CULTURE  CBC WITH DIFFERENTIAL/PLATELET  TROPONIN I    Imaging Review Dg Chest 2 View  03/07/2015   CLINICAL DATA:  Intermittent fatigue.  Hypertension.  Nausea.  EXAM: CHEST  2 VIEW  COMPARISON:  01/07/2015; 07/13/2013  FINDINGS: Grossly unchanged borderline enlarged cardiac silhouette and mediastinal contours with atherosclerotic plaque within the thoracic aorta. Stable  position of support apparatus. The lungs remain hyperexpanded. Left basilar heterogeneous opacities are unchanged and favored to represent atelectasis or scar. No new focal airspace opacities. No pleural effusion pneumothorax. No evidence of edema. Stigmata of DISH within the caudal aspect of the thoracic spine.  IMPRESSION: Similar findings of borderline cardiomegaly and lung hyperexpansion without acute cardiopulmonary disease.   Electronically Signed   By: Sandi Mariscal M.D.   On: 03/07/2015 19:21   Ct Abdomen Pelvis W Contrast  03/07/2015   CLINICAL DATA:  Hypertension. Fatigue and nausea. Renal insufficiency.  EXAM: CT ABDOMEN AND PELVIS WITH CONTRAST  TECHNIQUE: Multidetector CT imaging of the abdomen and pelvis was performed using the standard protocol following bolus administration of intravenous contrast.  CONTRAST:  45mL OMNIPAQUE IOHEXOL 300 MG/ML SOLN, 48mL OMNIPAQUE IOHEXOL 300 MG/ML SOLN  COMPARISON:  Multiple exams, including 05/17/2014  FINDINGS: Lower chest: Scattered small pulmonary nodules in the left lower lobe and lingula, somewhat clustered peripherally, measuring up to 4 mm in diameter.  Pacemaker leads observed.  Hepatobiliary: Borderline gallbladder wall thickening. Poor definition of the common bile duct which measures up to 9 mm diameter. This appears to taper in the vicinity of the ampulla.  Pancreas: Unremarkable  Spleen: Unremarkable  Adrenals/Urinary Tract: Unremarkable  Stomach/Bowel: Considerable sigmoid colon diverticulosis. There is a less degree of descending colon diverticulosis. Scattered air-fluid levels in nondilated loops of jejunum with borderline fold thickening.  Vascular/Lymphatic: Aortoiliac atherosclerotic vascular disease.  Reproductive: Uterus absent. 1.6 by 1.3 cm left adnexal cyst adjacent to the ovary. This has simple fluid density characteristics. Right ovary poorly seen.  Other: Trace free pelvic fluid.  Musculoskeletal: Degenerative disc disease with loss of  disc height at L4-5. There is moderate right foraminal stenosis at L4-5 due to right foraminal disc protrusion, intervertebral spurring, and facet spurring. Chondrocalcinosis in the pubic symphysis and in the acetabular labrum.  IMPRESSION: 1. Clustered small pulmonary nodules in the left lower lobe and lingula, suspicious for atypical infectious process such as atypical infectious bronchiolitis or fungal disease. 2. Borderline gallbladder wall thickening. Reviewing the patient's labs, this may well be due to the patient' s hypoalbuminemia and hypoproteinemia, although inflammation can also cause gallbladder wall thickening. 3. Poor definition of the  common bile duct which is mildly dilated. This is of uncertain significance. The patient's bilirubin is not elevated thus a biliary obstructive process is unlikely. Without liver enzyme elevation I doubt that the patient has cholangitis. This mild extrahepatic biliary dilatation might simply be incidental. 4. Sigmoid colon and descending colon diverticulosis. There is a small amount of free pelvic fluid but no mesenteric edema to suggest current active diverticulitis. 5. Scattered air-fluid levels in nondilated loops of jejunum. There is borderline fold thickening in these loops. The possibility of proximal enteritis is raised. 6.  Aortoiliac atherosclerotic vascular disease. 7. Average size 1.5 cm left adnexal cyst appears simple and does not require further workup. 8. Degenerative disc disease and spondylosis causing moderate right foraminal stenosis at L4-5. 9. Chondrocalcinosis in the pelvis, suggesting CPPD arthropathy.   Electronically Signed   By: Van Clines M.D.   On: 03/07/2015 21:24     EKG Interpretation None      MDM   Final diagnoses:  Pain  UTI (lower urinary tract infection)    admit    Milton Ferguson, MD 03/07/15 2304

## 2015-03-07 NOTE — ED Notes (Signed)
Pt is currently receiving Rituximab from Dr Ulice Dash.  Last treatment was may 19 th and is scheduled 2nd treatment tomorrow.  Pt is receiving iron infusions, last treatment was 2 months ago and was scheduled this week and canceled because pt was too weak.  Poor appetite and no energy.  Denies pain of SOB.

## 2015-03-07 NOTE — Telephone Encounter (Signed)
Pt's daughter called stating her mother Kelsey Sandoval called and states that she needs some help. Feeling sick and needs to be seen today.   Daughter states she has a kidney disease and has been losing weight, no appetite, weakness, and nausea. Advised daughter to take her to [facility] ER. Daughter agreed to take her. Dr. Nicki Reaper not in office tod

## 2015-03-08 ENCOUNTER — Inpatient Hospital Stay (HOSPITAL_COMMUNITY): Payer: Medicare Other

## 2015-03-08 ENCOUNTER — Encounter (HOSPITAL_COMMUNITY): Payer: Medicare Other

## 2015-03-08 DIAGNOSIS — D509 Iron deficiency anemia, unspecified: Secondary | ICD-10-CM

## 2015-03-08 DIAGNOSIS — E039 Hypothyroidism, unspecified: Secondary | ICD-10-CM

## 2015-03-08 DIAGNOSIS — R1111 Vomiting without nausea: Secondary | ICD-10-CM

## 2015-03-08 DIAGNOSIS — F329 Major depressive disorder, single episode, unspecified: Secondary | ICD-10-CM

## 2015-03-08 DIAGNOSIS — E440 Moderate protein-calorie malnutrition: Secondary | ICD-10-CM | POA: Diagnosis present

## 2015-03-08 DIAGNOSIS — J189 Pneumonia, unspecified organism: Secondary | ICD-10-CM | POA: Diagnosis present

## 2015-03-08 DIAGNOSIS — F32A Depression, unspecified: Secondary | ICD-10-CM | POA: Diagnosis present

## 2015-03-08 DIAGNOSIS — R634 Abnormal weight loss: Secondary | ICD-10-CM

## 2015-03-08 LAB — COMPREHENSIVE METABOLIC PANEL
ALT: 13 U/L — AB (ref 14–54)
AST: 21 U/L (ref 15–41)
Albumin: 2.5 g/dL — ABNORMAL LOW (ref 3.5–5.0)
Alkaline Phosphatase: 55 U/L (ref 38–126)
Anion gap: 7 (ref 5–15)
BILIRUBIN TOTAL: 0.7 mg/dL (ref 0.3–1.2)
BUN: 11 mg/dL (ref 6–20)
CHLORIDE: 112 mmol/L — AB (ref 101–111)
CO2: 23 mmol/L (ref 22–32)
Calcium: 8.3 mg/dL — ABNORMAL LOW (ref 8.9–10.3)
Creatinine, Ser: 1.17 mg/dL — ABNORMAL HIGH (ref 0.44–1.00)
GFR calc non Af Amer: 43 mL/min — ABNORMAL LOW (ref >60)
GFR, EST AFRICAN AMERICAN: 50 mL/min — AB (ref >60)
GLUCOSE: 98 mg/dL (ref 65–99)
Potassium: 3.4 mmol/L — ABNORMAL LOW (ref 3.5–5.1)
Sodium: 142 mmol/L (ref 135–145)
Total Protein: 4.8 g/dL — ABNORMAL LOW (ref 6.5–8.1)

## 2015-03-08 LAB — CBC
HCT: 34.2 % — ABNORMAL LOW (ref 36.0–46.0)
HEMOGLOBIN: 11.9 g/dL — AB (ref 12.0–15.0)
MCH: 32.1 pg (ref 26.0–34.0)
MCHC: 34.8 g/dL (ref 30.0–36.0)
MCV: 92.2 fL (ref 78.0–100.0)
Platelets: 212 10*3/uL (ref 150–400)
RBC: 3.71 MIL/uL — ABNORMAL LOW (ref 3.87–5.11)
RDW: 12.8 % (ref 11.5–15.5)
WBC: 6.9 10*3/uL (ref 4.0–10.5)

## 2015-03-08 LAB — GLUCOSE, CAPILLARY: Glucose-Capillary: 126 mg/dL — ABNORMAL HIGH (ref 65–99)

## 2015-03-08 LAB — VITAMIN B12: Vitamin B-12: 1471 pg/mL — ABNORMAL HIGH (ref 180–914)

## 2015-03-08 LAB — TSH: TSH: 3.457 u[IU]/mL (ref 0.350–4.500)

## 2015-03-08 MED ORDER — ALPRAZOLAM 1 MG PO TABS
1.0000 mg | ORAL_TABLET | Freq: Two times a day (BID) | ORAL | Status: DC | PRN
  Administered 2015-03-08 – 2015-03-11 (×7): 1 mg via ORAL
  Filled 2015-03-08 (×7): qty 1

## 2015-03-08 MED ORDER — CITALOPRAM HYDROBROMIDE 20 MG PO TABS
20.0000 mg | ORAL_TABLET | Freq: Every day | ORAL | Status: DC
  Administered 2015-03-08 – 2015-03-11 (×4): 20 mg via ORAL
  Filled 2015-03-08 (×4): qty 1

## 2015-03-08 MED ORDER — SIMETHICONE 80 MG PO CHEW
80.0000 mg | CHEWABLE_TABLET | Freq: Once | ORAL | Status: AC
  Administered 2015-03-08: 80 mg via ORAL
  Filled 2015-03-08: qty 1

## 2015-03-08 MED ORDER — DIFLUPREDNATE 0.05 % OP EMUL
1.0000 [drp] | Freq: Three times a day (TID) | OPHTHALMIC | Status: DC
  Administered 2015-03-08 – 2015-03-11 (×10): 1 [drp] via OPHTHALMIC

## 2015-03-08 MED ORDER — HYDROCODONE-ACETAMINOPHEN 5-325 MG PO TABS
1.0000 | ORAL_TABLET | ORAL | Status: DC | PRN
  Administered 2015-03-08 – 2015-03-09 (×6): 1 via ORAL
  Filled 2015-03-08 (×6): qty 1

## 2015-03-08 MED ORDER — PRAVASTATIN SODIUM 40 MG PO TABS
80.0000 mg | ORAL_TABLET | Freq: Every day | ORAL | Status: DC
  Administered 2015-03-08 – 2015-03-11 (×4): 80 mg via ORAL
  Filled 2015-03-08 (×4): qty 2

## 2015-03-08 MED ORDER — SODIUM CHLORIDE 0.45 % IV SOLN
INTRAVENOUS | Status: DC
  Administered 2015-03-08 – 2015-03-10 (×3): via INTRAVENOUS

## 2015-03-08 MED ORDER — METRONIDAZOLE IN NACL 5-0.79 MG/ML-% IV SOLN
500.0000 mg | Freq: Three times a day (TID) | INTRAVENOUS | Status: DC
  Administered 2015-03-08 (×3): 500 mg via INTRAVENOUS
  Filled 2015-03-08 (×3): qty 100

## 2015-03-08 MED ORDER — LEVOFLOXACIN IN D5W 750 MG/150ML IV SOLN
750.0000 mg | INTRAVENOUS | Status: DC
  Administered 2015-03-08 – 2015-03-10 (×2): 750 mg via INTRAVENOUS
  Filled 2015-03-08 (×2): qty 150

## 2015-03-08 MED ORDER — ONDANSETRON HCL 4 MG/2ML IJ SOLN
4.0000 mg | Freq: Four times a day (QID) | INTRAMUSCULAR | Status: DC | PRN
  Administered 2015-03-08 (×3): 4 mg via INTRAVENOUS
  Filled 2015-03-08 (×3): qty 2

## 2015-03-08 MED ORDER — ONDANSETRON HCL 4 MG/2ML IJ SOLN
4.0000 mg | INTRAMUSCULAR | Status: DC | PRN
  Administered 2015-03-08 – 2015-03-10 (×7): 4 mg via INTRAVENOUS
  Filled 2015-03-08 (×6): qty 2

## 2015-03-08 MED ORDER — PANTOPRAZOLE SODIUM 40 MG PO TBEC
40.0000 mg | DELAYED_RELEASE_TABLET | Freq: Every day | ORAL | Status: DC
  Administered 2015-03-08 – 2015-03-11 (×4): 40 mg via ORAL
  Filled 2015-03-08 (×4): qty 1

## 2015-03-08 MED ORDER — ONDANSETRON HCL 4 MG/2ML IJ SOLN
4.0000 mg | Freq: Every day | INTRAMUSCULAR | Status: DC
  Administered 2015-03-09 – 2015-03-11 (×3): 4 mg via INTRAVENOUS
  Filled 2015-03-08 (×3): qty 2

## 2015-03-08 MED ORDER — ENSURE ENLIVE PO LIQD
237.0000 mL | Freq: Two times a day (BID) | ORAL | Status: DC
  Administered 2015-03-09 – 2015-03-11 (×6): 237 mL via ORAL

## 2015-03-08 MED ORDER — TOBRAMYCIN 0.3 % OP SOLN
1.0000 [drp] | Freq: Four times a day (QID) | OPHTHALMIC | Status: AC
  Administered 2015-03-08 (×4): 1 [drp] via OPHTHALMIC
  Filled 2015-03-08: qty 5

## 2015-03-08 MED ORDER — DABIGATRAN ETEXILATE MESYLATE 150 MG PO CAPS
150.0000 mg | ORAL_CAPSULE | Freq: Two times a day (BID) | ORAL | Status: DC
  Administered 2015-03-08 – 2015-03-11 (×7): 150 mg via ORAL
  Filled 2015-03-08 (×12): qty 1

## 2015-03-08 MED ORDER — ONDANSETRON HCL 4 MG PO TABS: 4.0000 mg | ORAL_TABLET | Freq: Four times a day (QID) | ORAL | Status: DC | PRN

## 2015-03-08 MED ORDER — POTASSIUM CHLORIDE CRYS ER 20 MEQ PO TBCR
40.0000 meq | EXTENDED_RELEASE_TABLET | Freq: Once | ORAL | Status: AC
  Administered 2015-03-08: 40 meq via ORAL
  Filled 2015-03-08: qty 2

## 2015-03-08 MED ORDER — SODIUM CHLORIDE 0.9 % IV SOLN
INTRAVENOUS | Status: DC
  Administered 2015-03-08: 01:00:00 via INTRAVENOUS

## 2015-03-08 MED ORDER — KETOROLAC TROMETHAMINE 0.5 % OP SOLN
1.0000 [drp] | Freq: Every day | OPHTHALMIC | Status: DC
  Administered 2015-03-08 – 2015-03-11 (×4): 1 [drp] via OPHTHALMIC
  Filled 2015-03-08: qty 3

## 2015-03-08 MED ORDER — CIPROFLOXACIN IN D5W 400 MG/200ML IV SOLN
400.0000 mg | Freq: Two times a day (BID) | INTRAVENOUS | Status: DC
  Administered 2015-03-08 (×2): 400 mg via INTRAVENOUS
  Filled 2015-03-08 (×2): qty 200

## 2015-03-08 MED ORDER — LEVOTHYROXINE SODIUM 25 MCG PO TABS
25.0000 ug | ORAL_TABLET | Freq: Every day | ORAL | Status: DC
  Administered 2015-03-08 – 2015-03-11 (×4): 25 ug via ORAL
  Filled 2015-03-08 (×4): qty 1

## 2015-03-08 MED ORDER — FLECAINIDE ACETATE 50 MG PO TABS
50.0000 mg | ORAL_TABLET | Freq: Two times a day (BID) | ORAL | Status: DC
  Administered 2015-03-08 – 2015-03-11 (×7): 50 mg via ORAL
  Filled 2015-03-08 (×13): qty 1

## 2015-03-08 MED ORDER — ONDANSETRON HCL 4 MG PO TABS
4.0000 mg | ORAL_TABLET | Freq: Four times a day (QID) | ORAL | Status: DC | PRN
  Administered 2015-03-11: 4 mg via ORAL
  Filled 2015-03-08: qty 1

## 2015-03-08 MED ORDER — METOPROLOL SUCCINATE ER 50 MG PO TB24
100.0000 mg | ORAL_TABLET | Freq: Every day | ORAL | Status: DC
  Administered 2015-03-08 – 2015-03-11 (×4): 100 mg via ORAL
  Filled 2015-03-08 (×4): qty 2

## 2015-03-08 NOTE — Care Management Note (Signed)
Case Management Note  Patient Details  Name: Kelsey Sandoval MRN: [id number] Date of Birth: 1933-09-07  Expected Discharge Date:                  Expected Discharge Plan:  Home/Self Care  In-House Referral:  NA  Discharge planning Services  CM Consult  Post Acute Care Choice:  NA Choice offered to:  NA  DME Arranged:    DME Agency:     HH Arranged:    [place] Agency:     Status of Service:  Completed, signed off  Medicare Important Message Given:  Yes Date Medicare IM Given:  03/08/15 Medicare IM give by:  Jolene Provost, RN, MSN, CM Date Additional Medicare IM Given:    Additional Medicare Important Message give by:     If discussed at [place] of Stay Meetings, dates discussed:    Additional Comments: Pt is from home and independent at baseline. Pt has no HH services or DME's prior to admission. Pt plans to discharge home with self care. Patient's family discussed concern for her knee and back pain and difficulty moving about. PT encouraged by family but patient addimantly  Refused either HH or OP PT. No CM needs anticipated.   Sherald Barge, RN 03/08/2015, 4:07 PM

## 2015-03-08 NOTE — Progress Notes (Signed)
[facility] PROGRESS NOTE  Kelsey Sandoval [id number] DOB: 01-29-1933 DOA: 03/07/2015 PCP: Sallee Lange, MD  Assessment/Plan: 1. Nausea and vomiting. Abdominal CT as well as abdominal ultrasound have been unrevealing. Is not appear that she has a cholecystitis or enteritis. It is possible that her nausea and vomiting may be related to depression. Will consult gastroenterology to see if EGD is needed. The patient is on rituximab but can also contribute to nausea. 2. Possible community-acquired pneumonia. CT of the abdomen and pelvis indicated left lower lobe lung nodule which indicates possible atypical infection. She is on antibiotic therapy with Levaquin. She will likely need repeat imaging in 4-6 weeks. 3. History of nephritis. She received rituximab at [facility]. This can be followed as an outpatient. Would not administer rituximab at this time. 4. Depression. Patient is somewhat tearful today and describes that she has many stressors in her life. She describes frequent crying spells and anhedonia. We'll start the patient on Celexa. 5. Fatigue. Likely related to depression, although this can also be related to rituximab. O follow-up with her [facility] physicians prior to any further rituximab infusions. 6. History of atrial fibrillation. Continue on metoprolol as well as pradaxa. She is also on flecainide. 7. Hypothyroidism. TSH has improved with Synthroid therapy. Continue current treatments.  Code Status: full code Family Communication: discussed with patient and daughter at the bedside Disposition Plan: discharge home once improved   Consultants:    Procedures:    Antibiotics:  levaquin 6/3>>  HPI/Subjective: Reports chronic nausea with intermittent vomiting. No abdominal pain. Has had significant weight loss over last several months. No appetite. Describes several stressors in life. Feels very depressed. No shortness of breath or cough. Symptoms became worse recently after eating a  hamburger  Objective: Filed Vitals:   03/08/15 1354  BP: 162/81  Pulse: 66  Temp: 98.6 F (37 C)  Resp: 20    Intake/Output Summary (Last 24 hours) at 03/08/15 1756 Last data filed at 03/08/15 0700  Gross per 24 hour  Intake 686.25 ml  Output    900 ml  Net -213.75 ml   Filed Weights   03/07/15 1635 03/08/15 0058  Weight: 68.04 kg (150 lb) 67.994 kg (149 lb 14.4 oz)    Exam:   General:  NAD, appears tearful  Cardiovascular: s1, s2, rrr  Respiratory: cta b  Abdomen: soft, nt, nd, bs+  Musculoskeletal: no edema b/l   Data Reviewed: Basic Metabolic Panel:  Recent Labs Lab 03/07/15 1832 03/08/15 0718  NA 141 142  K 3.6 3.4*  CL 108 112*  CO2 23 23  GLUCOSE 120* 98  BUN 16 11  CREATININE 1.33* 1.17*  CALCIUM 8.6* 8.3*   Liver Function Tests:  Recent Labs Lab 03/07/15 1832 03/08/15 0718  AST 26 21  ALT 16 13*  ALKPHOS 67 55  BILITOT 0.4 0.7  PROT 5.7* 4.8*  ALBUMIN 3.1* 2.5*   No results for input(s): LIPASE, AMYLASE in the last 168 hours. No results for input(s): AMMONIA in the last 168 hours. CBC:  Recent Labs Lab 03/07/15 1832 03/08/15 0718  WBC 7.8 6.9  NEUTROABS 5.4  --   HGB 12.6 11.9*  HCT 36.1 34.2*  MCV 91.6 92.2  PLT 246 212   Cardiac Enzymes:  Recent Labs Lab 03/07/15 1832  TROPONINI <0.03   BNP (last 3 results) No results for input(s): BNP in the last 8760 hours.  ProBNP (last 3 results) No results for input(s): PROBNP in the last 8760 hours.  CBG: No results for input(s): GLUCAP in the last 168 hours.  No results found for this or any previous visit (from the past 240 hour(s)).   Studies: Dg Chest 2 View  03/07/2015   CLINICAL DATA:  Intermittent fatigue.  Hypertension.  Nausea.  EXAM: CHEST  2 VIEW  COMPARISON:  01/07/2015; 07/13/2013  FINDINGS: Grossly unchanged borderline enlarged cardiac silhouette and mediastinal contours with atherosclerotic plaque within the thoracic aorta. Stable position of support  apparatus. The lungs remain hyperexpanded. Left basilar heterogeneous opacities are unchanged and favored to represent atelectasis or scar. No new focal airspace opacities. No pleural effusion pneumothorax. No evidence of edema. Stigmata of DISH within the caudal aspect of the thoracic spine.  IMPRESSION: Similar findings of borderline cardiomegaly and lung hyperexpansion without acute cardiopulmonary disease.   Electronically Signed   By: Sandi Mariscal M.D.   On: 03/07/2015 19:21   US Abdomen Complete  03/08/2015   CLINICAL DATA:  Cholecystitis, nausea, vomiting, history hypertension, hyperlipidemia, renal insufficiency  EXAM: ULTRASOUND ABDOMEN COMPLETE  COMPARISON:  CT abdomen and pelvis 03/07/2015  FINDINGS: Gallbladder: Well distended without stones or wall thickening. No pericholecystic fluid or sonographic Murphy sign.  Common bile duct: Diameter: 9 mm diameter, minimally prominent for age.  Liver: Normal appearance  IVC: Normal appearance  Pancreas: Normal appearance  Spleen: Poorly visualized due to high subcostal position, grossly normal size 5.3 cm length.  Right Kidney: Length: 12.2 cm. Normal morphology without mass or hydronephrosis.  Left Kidney: Length: 10.0 cm. Normal morphology without mass or hydronephrosis.  Abdominal aorta: Obscured proximally by bowel gas. Visualized portions normal caliber.  Other findings: No free-fluid  IMPRESSION: No evidence of gallstones or sonographic features of acute cholecystitis.  Prominent CBD 9 mm diameter, recommend correlation with LFTs.   Electronically Signed   By: Lavonia Dana M.D.   On: 03/08/2015 09:12   Ct Abdomen Pelvis W Contrast  03/07/2015   CLINICAL DATA:  Hypertension. Fatigue and nausea. Renal insufficiency.  EXAM: CT ABDOMEN AND PELVIS WITH CONTRAST  TECHNIQUE: Multidetector CT imaging of the abdomen and pelvis was performed using the standard protocol following bolus administration of intravenous contrast.  CONTRAST:  50mL OMNIPAQUE IOHEXOL 300  MG/ML SOLN, 7mL OMNIPAQUE IOHEXOL 300 MG/ML SOLN  COMPARISON:  Multiple exams, including 05/17/2014  FINDINGS: Lower chest: Scattered small pulmonary nodules in the left lower lobe and lingula, somewhat clustered peripherally, measuring up to 4 mm in diameter.  Pacemaker leads observed.  Hepatobiliary: Borderline gallbladder wall thickening. Poor definition of the common bile duct which measures up to 9 mm diameter. This appears to taper in the vicinity of the ampulla.  Pancreas: Unremarkable  Spleen: Unremarkable  Adrenals/Urinary Tract: Unremarkable  Stomach/Bowel: Considerable sigmoid colon diverticulosis. There is a less degree of descending colon diverticulosis. Scattered air-fluid levels in nondilated loops of jejunum with borderline fold thickening.  Vascular/Lymphatic: Aortoiliac atherosclerotic vascular disease.  Reproductive: Uterus absent. 1.6 by 1.3 cm left adnexal cyst adjacent to the ovary. This has simple fluid density characteristics. Right ovary poorly seen.  Other: Trace free pelvic fluid.  Musculoskeletal: Degenerative disc disease with loss of disc height at L4-5. There is moderate right foraminal stenosis at L4-5 due to right foraminal disc protrusion, intervertebral spurring, and facet spurring. Chondrocalcinosis in the pubic symphysis and in the acetabular labrum.  IMPRESSION: 1. Clustered small pulmonary nodules in the left lower lobe and lingula, suspicious for atypical infectious process such as atypical infectious bronchiolitis or fungal disease. 2. Borderline gallbladder wall thickening. Reviewing  the patient's labs, this may well be due to the patient' s hypoalbuminemia and hypoproteinemia, although inflammation can also cause gallbladder wall thickening. 3. Poor definition of the common bile duct which is mildly dilated. This is of uncertain significance. The patient's bilirubin is not elevated thus a biliary obstructive process is unlikely. Without liver enzyme elevation I doubt that  the patient has cholangitis. This mild extrahepatic biliary dilatation might simply be incidental. 4. Sigmoid colon and descending colon diverticulosis. There is a small amount of free pelvic fluid but no mesenteric edema to suggest current active diverticulitis. 5. Scattered air-fluid levels in nondilated loops of jejunum. There is borderline fold thickening in these loops. The possibility of proximal enteritis is raised. 6.  Aortoiliac atherosclerotic vascular disease. 7. Average size 1.5 cm left adnexal cyst appears simple and does not require further workup. 8. Degenerative disc disease and spondylosis causing moderate right foraminal stenosis at L4-5. 9. Chondrocalcinosis in the pelvis, suggesting CPPD arthropathy.   Electronically Signed   By: Van Clines M.D.   On: 03/07/2015 21:24    Scheduled Meds: . citalopram  20 mg Oral Daily  . dabigatran  150 mg Oral BID  . Difluprednate  1 drop Left Eye TID  . feeding supplement (ENSURE ENLIVE)  237 mL Oral BID BM  . flecainide  50 mg Oral BID  . ketorolac  1 drop Left Eye Daily  . levofloxacin (LEVAQUIN) IV  750 mg Intravenous Q24H  . levothyroxine  25 mcg Oral QAC breakfast  . metoprolol succinate  100 mg Oral Daily  . [START ON 03/09/2015] ondansetron (ZOFRAN) IV  4 mg Intravenous Q0600  . pantoprazole  40 mg Oral Daily  . pravastatin  80 mg Oral Daily  . tobramycin  1 drop Left Eye QID   Continuous Infusions: . sodium chloride 75 mL/hr at 03/08/15 1307    Active Problems:   Paroxysmal atrial fibrillation   Pacemaker   Hypothyroidism   Nausea & vomiting   Malnutrition of moderate degree   Depression   CAP (community acquired pneumonia)    Time spent: 65mins    MEMON,JEHANZEB  [facility] Pager [phone]. If 7PM-7AM, please contact night-coverage at [email or web address], password [facility] 03/08/2015, 5:56 PM  LOS: 1 day

## 2015-03-08 NOTE — Progress Notes (Signed)
Initial Nutrition Assessment  DOCUMENTATION CODES: Non-severe (moderate) malnutrition in context of chronic illness  INTERVENTION: Magic cup BID with meals, each supplement provides 290 kcal and 9 grams of protein  Snacks per pt request  Ensure Enlive po BID, each supplement provides 350 kcal and 20 grams of protein  NUTRITION DIAGNOSIS: Unintentional weight loss related to nausea, vomiting as evidenced by loss of 12% wt loss in 1 year  GOAL: Patient will meet greater than or equal to 90% of their needs  MONITOR: PO intake, Supplement acceptance, Labs  REASON FOR ASSESSMENT: Malnutrition Screening Tool    ASSESSMENT: 81 year old female PMHx: HTN,  Anxiety; Depression;  HLD; Pre-diabetes; Osteopenia; Iron Deficiency Anemia, CKD 3 who was seen at [facility] for anemia evaluation. Presents with ausea and fatigue which has been going for months, but increased significantly in the on for past 4 days. She also has intermittent vomiting.  Pt reports that has had intermittent nausea and vomiting for the past year. She states that she always wakes up nauseated. Some days the nausea gets better and other days it does not. Recently it has been much worse. Pt reports that she currently has no appetite. In general she admits to loving saltier foods such as chips which she snacks on. She was agreeable to Ensure/Magic Cup/Snacks  She ports taking calcium, b12 and Ensure 1x a day at home.   Family states her "normal weight" was about 170 lbs.   Labs reviewed: Hypokalemia, Hyperchloremic, Elevated Creat, hypercalcemia, hypoalbuminemia,   Height: Ht Readings from Last 1 Encounters:  03/07/15 5\' 9"  (1.753 m)    Weight: Wt Readings from Last 1 Encounters:  03/08/15 149 lb 14.4 oz (67.994 kg)    Ideal Body Weight:  65.9 kg  Wt Readings from Last 10 Encounters:  03/08/15 149 lb 14.4 oz (67.994 kg)  02/28/15 151 lb (68.493 kg)  02/21/15 154 lb 6 oz (70.024 kg)  02/13/15 143 lb (64.864 kg)   12/18/14 160 lb 12.8 oz (72.938 kg)  12/06/14 156 lb 1.6 oz (70.806 kg)  11/06/14 154 lb 11.2 oz (70.171 kg)  10/17/14 159 lb (72.122 kg)  09/18/14 162 lb 14.4 oz (73.891 kg)  09/17/14 157 lb (71.215 kg)  Wt 02/23/14: 168 lbs. Patient has been losing weight for the last few years. No significant loss for time frame though.  BMI:  Body mass index is 22.13 kg/(m^2).  Estimated Nutritional Needs: Kcal:  1600-1700 kcals (24-25 kcal/kg) Protein:  68-82 (1-1.2 g/kg) Fluid:  1.6-1.7 liters   Skin:  Reviewed, no issues  Diet Order:  DIET SOFT Room service appropriate?: Yes; Fluid consistency:: Thin  EDUCATION NEEDS:  No education needs identified at this time   Intake/Output Summary (Last 24 hours) at 03/08/15 1341 Last data filed at 03/08/15 0700  Gross per 24 hour  Intake 686.25 ml  Output    900 ml  Net -213.75 ml    Last BM: 6/1  Burtis Junes RD, LDN Nutrition Pager: [phone] 03/08/2015 1:41 PM

## 2015-03-08 NOTE — Progress Notes (Signed)
Patient is complaining of gas, paged on-call MD, will follow new orders given and continue to monitor.

## 2015-03-08 NOTE — Consult Note (Signed)
Referring Provider: No ref. provider found Primary Care Physician:  Sallee Lange, MD Primary Gastroenterologist:  Dr. Laural Golden  Reason for Consultation:    Nausea vomiting and weight loss.  HPI:   History is provided by the patient and her two children who are at bedside.  Patient is 81 year old Caucasian female who is well known to me from previous evaluation for iron deficiency anemia who underwent EGD and colonoscopy last year who was admitted to hospice service yesterday while emergency room where she was brought by her family for worsening nausea weakness and weight loss. According to her son she has lost 9 pounds in the last 3 weeks. Patient states she hasn't felt well for well over a year. She has had nausea daily for several months. Nausea is worse when she wakes up in the morning. As the day progresses she feels better and she has no nausea whatsoever at night. Nausea does not get worse with meals but she has had poor appetite. She has had very few episodes of vomiting. She recalls she had one episode 2 weeks ago and she ate hamburger and vomited the next day. She has occasional heartburn with certain foods. She is taking omeprazole. She denies dysphagia hematemesis melena or rectal bleeding. She has had no abdominal pain whatsoever. Her bowels been moving regularly but she has not BUN diarrhea and/or constipation. Was diagnosed with nephrotic syndrome and received first dose of rituximab about 2 weeks ago and is due for dose today. She is in the care of nephrologist at [facility]. She has chronic back pain and takes pain medication every day. She says she would prefer to take Advil but she has been told by her nephrologist not to take any NSAIDs. He denies fever chills cough shortness of breath or night sweats. She feels very depressed. She was on Celexa but was discontinued as there was a question of side effects. Dr. Roderic Palau has begun her this medication today. According to her daughter  her boyfriend of 19 years is very sick and she is not able to see him or help him and her son has had multiple illnesses recently. Patient has lost 21 pounds since November 2014. Patient had abdominopelvic CT on admission which suggested borderline gallbladder wall thickening CBD measuring 9 mm but without cholelithiasis and no pancreatic abnormality. Question of jejunal wall thickening. This morning she had upper abdominal ultrasound and gallbladder wall is not thick. There is no evidence of cholelithiasis. Patient states ondansetron helps with nausea grade deal. She states she had left cataract extraction 2 weeks ago and she is able to see better. She lives alone. She does not smoke cigarettes or drink alcohol. Family history is significant for CRC in brother was 77 at the time of diagnosis.  Prior GI workup as follows; She had colonoscopy on 10/11/2013 for iron deficiency anemia. She had multiple diverticula at sigmoid and descending colon and small anal papilla. She had EGD on 01/17/2014. At small sliding hiatal hernia nonerosive antral gastritis. There was intestinal metaplasia involving antral mucosa. Duodenal biopsy was negative for celiac disease and H. pylori serology was also negative. Anemia has corrected with parenteral iron.     Past Medical History  Diagnosis Date  . Hypertension   . Dysrhythmia   . Anxiety   . Depression   . Pacemaker   . Arthritis   . Hyperlipidemia   . Pre-diabetes   . Osteopenia   . Atrial fibrillation   . Back pain, chronic   .  Cataracts, bilateral   . Urine protein increased   . Nephrotic syndrome     RHUX    Past Surgical History  Procedure Laterality Date  . Insert / replace / remove pacemaker    . Appendectomy    . Abdominal hysterectomy      partial-pt has no ovaries  . Back surgery    . Colonoscopy  9/05  . Dual chamber pacemaker      2012  . Colonoscopy N/A 10/11/2013    Procedure: COLONOSCOPY;  Surgeon: Rogene Houston, MD;   Location: [facility];  Service: Endoscopy;  Laterality: N/A;  1200  . Esophagogastroduodenoscopy N/A 01/17/2014    Procedure: ESOPHAGOGASTRODUODENOSCOPY (EGD);  Surgeon: Rogene Houston, MD;  Location: [facility];  Service: Endoscopy;  Laterality: N/A;  230  . Esophageal biopsy N/A 01/17/2014    Procedure: BIOPSY;  Surgeon: Rogene Houston, MD;  Location: [facility];  Service: Endoscopy;  Laterality: N/A;  . Wisdom tooth extracton    . Cataract extraction w/phaco Left 02/28/2015    Procedure: CATARACT EXTRACTION PHACO AND INTRAOCULAR LENS PLACEMENT (IOC);  Surgeon: [name], MD;  Location: [facility];  Service: Ophthalmology;  Laterality: Left;  CDE 18.71    Prior to Admission medications   Medication Sig Start Date End Date Taking? Authorizing Provider  albuterol (PROVENTIL HFA;VENTOLIN HFA) 108 (90 BASE) MCG/ACT inhaler Inhale 2 puffs into the lungs every 6 (six) hours as needed for wheezing or shortness of breath. 09/17/14  Yes Kathyrn Drown, MD  ALPRAZolam Duanne Moron) 1 MG tablet Take 1 tablet (1 mg total) by mouth 2 (two) times daily as needed for anxiety. 09/17/14  Yes Kathyrn Drown, MD  amLODipine (NORVASC) 10 MG tablet TAKE ONE TABLET BY MOUTH ONCE DAILY 01/07/15  Yes Kathyrn Drown, MD  dabigatran (PRADAXA) 150 MG CAPS capsule Take 1 capsule (150 mg total) by mouth 2 (two) times daily. 03/05/15  Yes Mihai Croitoru, MD  DUREZOL 0.05 % EMUL Place 1 drop into the left eye 3 (three) times daily. 02/22/15  Yes Historical Provider, MD  flecainide (TAMBOCOR) 50 MG tablet Take 1 tablet (50 mg total) by mouth 2 (two) times daily. 06/07/14  Yes Pixie Casino, MD  HYDROcodone-acetaminophen (NORCO/VICODIN) 5-325 MG per tablet Take 1 tablet by mouth every 4 (four) hours as needed for moderate pain. 12/18/14  Yes Kathyrn Drown, MD  levothyroxine (LEVOTHROID) 25 MCG tablet Take 1 tablet (25 mcg total) by mouth daily before breakfast. 02/14/15  Yes Kathyrn Drown, MD  lisinopril (PRINIVIL,ZESTRIL) 40 MG  tablet TAKE ONE TABLET BY MOUTH ONCE DAILY 01/07/15  Yes Kathyrn Drown, MD  metoprolol succinate (TOPROL-XL) 100 MG 24 hr tablet Take 100 mg by mouth daily. Take with or immediately following a meal.   Yes Historical Provider, MD  metoprolol succinate (TOPROL-XL) 50 MG 24 hr tablet TAKE ONE TABLET BY MOUTH ONCE DAILY -TAKE  WITH  OR  IMMEDIATELY  FOLLOWING  A  MEAL 01/07/15  Yes Kathyrn Drown, MD  omeprazole (PRILOSEC) 20 MG capsule Take 1 capsule (20 mg total) by mouth 2 (two) times daily before a meal. 12/18/14  Yes Scott A Luking, MD  ondansetron (ZOFRAN-ODT) 8 MG disintegrating tablet DISSOLVE ONE TABLET IN MOUTH EVERY 8 HOURS AS NEEDED FOR NAUSEA OR VOMITING 03/05/15  Yes Kathyrn Drown, MD  pravastatin (PRAVACHOL) 80 MG tablet Take 1 tablet (80 mg total) by mouth daily. 02/23/14  Yes Kathyrn Drown, MD  PROLENSA 0.07 % SOLN Place 1 drop into the left eye daily. 02/22/15  Yes Historical Provider, MD  riTUXimab in sodium chloride 0.9 % 250 mL Inject into the vein once. Infusion 2 weeks apart: May repeat in 6 months   Yes Historical Provider, MD  tobramycin (TOBREX) 0.3 % ophthalmic solution Place 1 drop into the left eye 4 (four) times daily. 02/22/15  Yes Historical Provider, MD  vitamin B-12 (CYANOCOBALAMIN) 1000 MCG tablet Take 1,000 mcg by mouth 2 (two) times daily.   Yes Historical Provider, MD    Current Facility-Administered Medications  Medication Dose Route Frequency Provider Last Rate Last Dose  . 0.45 % sodium chloride infusion   Intravenous Continuous Kathie Dike, MD 75 mL/hr at 03/08/15 1307    . ALPRAZolam Duanne Moron) tablet 1 mg  1 mg Oral BID PRN Oswald Hillock, MD   1 mg at 03/08/15 1329  . ciprofloxacin (CIPRO) IVPB 400 mg  400 mg Intravenous Q12H Oswald Hillock, MD   400 mg at 03/08/15 1307  . citalopram (CELEXA) tablet 20 mg  20 mg Oral Daily Kathie Dike, MD   20 mg at 03/08/15 1307  . dabigatran (PRADAXA) capsule 150 mg  150 mg Oral BID Oswald Hillock, MD   150 mg at 03/08/15 1005  .  Difluprednate 0.05 % EMUL 1 drop  1 drop Left Eye TID Oswald Hillock, MD   1 drop at 03/08/15 1625  . feeding supplement (ENSURE ENLIVE) (ENSURE ENLIVE) liquid 237 mL  237 mL Oral BID BM Oswaldo Milian, RD   237 mL at 03/08/15 1400  . flecainide (TAMBOCOR) tablet 50 mg  50 mg Oral BID Oswald Hillock, MD   50 mg at 03/08/15 1101  . HYDROcodone-acetaminophen (NORCO/VICODIN) 5-325 MG per tablet 1 tablet  1 tablet Oral Q4H PRN Oswald Hillock, MD   1 tablet at 03/08/15 1329  . ketorolac (ACULAR) 0.5 % ophthalmic solution 1 drop  1 drop Left Eye Daily Oswald Hillock, MD   1 drop at 03/08/15 1005  . levothyroxine (SYNTHROID, LEVOTHROID) tablet 25 mcg  25 mcg Oral QAC breakfast Oswald Hillock, MD   25 mcg at 03/08/15 0926  . metoprolol succinate (TOPROL-XL) 24 hr tablet 100 mg  100 mg Oral Daily Oswald Hillock, MD   100 mg at 03/08/15 0926  . metroNIDAZOLE (FLAGYL) IVPB 500 mg  500 mg Intravenous Q8H Oswald Hillock, MD   500 mg at 03/08/15 1625  . ondansetron (ZOFRAN) tablet 4 mg  4 mg Oral Q6H PRN Oswald Hillock, MD       Or  . ondansetron (ZOFRAN) injection 4 mg  4 mg Intravenous Q6H PRN Oswald Hillock, MD   4 mg at 03/08/15 1616  . pantoprazole (PROTONIX) EC tablet 40 mg  40 mg Oral Daily Oswald Hillock, MD   40 mg at 03/08/15 0926  . pravastatin (PRAVACHOL) tablet 80 mg  80 mg Oral Daily Oswald Hillock, MD   80 mg at 03/08/15 0926  . tobramycin (TOBREX) 0.3 % ophthalmic solution 1 drop  1 drop Left Eye QID Kathie Dike, MD   1 drop at 03/08/15 1625    Allergies as of 03/07/2015 - Review Complete 03/07/2015  Allergen Reaction Noted  . Penicillins Other (See Comments) 12/24/2012  . Levaquin [levofloxacin] Nausea Only 01/10/2014  . Sulfa antibiotics Other (See Comments) 12/24/2012  . Zithromax [azithromycin] Nausea Only and Rash 12/24/2012    Family History  Problem Relation Age of Onset  . Colon cancer Brother     History   Social History  . Marital Status: Divorced    Spouse Name: N/A  . Number of  Children: N/A  . Years of Education: N/A   Occupational History  . Not on file.   Social History Main Topics  . Smoking status: Never Smoker   . Smokeless tobacco: Never Used  . Alcohol Use: No  . Drug Use: No  . Sexual Activity: Not on file   Other Topics Concern  . Not on file   Social History Narrative    Review of Systems: See HPI, otherwise normal ROS  Physical Exam: Temp:  [97.6 F (36.4 C)-98.6 F (37 C)] 98.6 F (37 C) (06/03 1354) Pulse Rate:  [66-81] 66 (06/03 1354) Resp:  [11-20] 20 (06/03 1354) BP: (107-184)/(73-117) 162/81 mmHg (06/03 1354) SpO2:  [95 %-100 %] 100 % (06/03 1354) Weight:  [149 lb 14.4 oz (67.994 kg)] 149 lb 14.4 oz (67.994 kg) (06/03 0058) Last BM Date: 03/06/15 Patient is alert and in no acute distress. Conjunctiva was pink. Sclerae nonicteric. Oropharyngeal mucosa is normal. She has partial upper and complete lower dentures. No neck masses or thyromegaly noted. Cardiac exam with regular rhythm normal S1 and S2. No murmur or gallop noted. Lungs are clear to auscultation. Abdomen is symmetrical. Bowel sounds are normal. No bruits noted. On palpation abdomen is soft and nontender without organomegaly or masses. No peripheral edema or clubbing noted.       Lab Results:  Recent Labs  03/07/15 1832 03/08/15 0718  WBC 7.8 6.9  HGB 12.6 11.9*  HCT 36.1 34.2*  PLT 246 212   BMET  Recent Labs  03/07/15 1832 03/08/15 0718  NA 141 142  K 3.6 3.4*  CL 108 112*  CO2 23 23  GLUCOSE 120* 98  BUN 16 11  CREATININE 1.33* 1.17*  CALCIUM 8.6* 8.3*   LFT  Recent Labs  03/08/15 0718  PROT 4.8*  ALBUMIN 2.5*  AST 21  ALT 13*  ALKPHOS 55  BILITOT 0.7   PT/INR No results for input(s): LABPROT, INR in the last 72 hours. Hepatitis Panel No results for input(s): HEPBSAG, HCVAB, HEPAIGM, HEPBIGM in the last 72 hours.  Studies/Results: Dg Chest 2 View  03/07/2015   CLINICAL DATA:  Intermittent fatigue.  Hypertension.  Nausea.   EXAM: CHEST  2 VIEW  COMPARISON:  01/07/2015; 07/13/2013  FINDINGS: Grossly unchanged borderline enlarged cardiac silhouette and mediastinal contours with atherosclerotic plaque within the thoracic aorta. Stable position of support apparatus. The lungs remain hyperexpanded. Left basilar heterogeneous opacities are unchanged and favored to represent atelectasis or scar. No new focal airspace opacities. No pleural effusion pneumothorax. No evidence of edema. Stigmata of DISH within the caudal aspect of the thoracic spine.  IMPRESSION: Similar findings of borderline cardiomegaly and lung hyperexpansion without acute cardiopulmonary disease.   Electronically Signed   By: Sandi Mariscal M.D.   On: 03/07/2015 19:21   US Abdomen Complete  03/08/2015   CLINICAL DATA:  Cholecystitis, nausea, vomiting, history hypertension, hyperlipidemia, renal insufficiency  EXAM: ULTRASOUND ABDOMEN COMPLETE  COMPARISON:  CT abdomen and pelvis 03/07/2015  FINDINGS: Gallbladder: Well distended without stones or wall thickening. No pericholecystic fluid or sonographic Murphy sign.  Common bile duct: Diameter: 9 mm diameter, minimally prominent for age.  Liver: Normal appearance  IVC: Normal appearance  Pancreas: Normal appearance  Spleen: Poorly visualized due to high subcostal position, grossly normal size 5.3  cm length.  Right Kidney: Length: 12.2 cm. Normal morphology without mass or hydronephrosis.  Left Kidney: Length: 10.0 cm. Normal morphology without mass or hydronephrosis.  Abdominal aorta: Obscured proximally by bowel gas. Visualized portions normal caliber.  Other findings: No free-fluid  IMPRESSION: No evidence of gallstones or sonographic features of acute cholecystitis.  Prominent CBD 9 mm diameter, recommend correlation with LFTs.   Electronically Signed   By: Lavonia Dana M.D.   On: 03/08/2015 09:12   Ct Abdomen Pelvis W Contrast  03/07/2015   CLINICAL DATA:  Hypertension. Fatigue and nausea. Renal insufficiency.  EXAM: CT  ABDOMEN AND PELVIS WITH CONTRAST  TECHNIQUE: Multidetector CT imaging of the abdomen and pelvis was performed using the standard protocol following bolus administration of intravenous contrast.  CONTRAST:  89mL OMNIPAQUE IOHEXOL 300 MG/ML SOLN, 67mL OMNIPAQUE IOHEXOL 300 MG/ML SOLN  COMPARISON:  Multiple exams, including 05/17/2014  FINDINGS: Lower chest: Scattered small pulmonary nodules in the left lower lobe and lingula, somewhat clustered peripherally, measuring up to 4 mm in diameter.  Pacemaker leads observed.  Hepatobiliary: Borderline gallbladder wall thickening. Poor definition of the common bile duct which measures up to 9 mm diameter. This appears to taper in the vicinity of the ampulla.  Pancreas: Unremarkable  Spleen: Unremarkable  Adrenals/Urinary Tract: Unremarkable  Stomach/Bowel: Considerable sigmoid colon diverticulosis. There is a less degree of descending colon diverticulosis. Scattered air-fluid levels in nondilated loops of jejunum with borderline fold thickening.  Vascular/Lymphatic: Aortoiliac atherosclerotic vascular disease.  Reproductive: Uterus absent. 1.6 by 1.3 cm left adnexal cyst adjacent to the ovary. This has simple fluid density characteristics. Right ovary poorly seen.  Other: Trace free pelvic fluid.  Musculoskeletal: Degenerative disc disease with loss of disc height at L4-5. There is moderate right foraminal stenosis at L4-5 due to right foraminal disc protrusion, intervertebral spurring, and facet spurring. Chondrocalcinosis in the pubic symphysis and in the acetabular labrum.  IMPRESSION: 1. Clustered small pulmonary nodules in the left lower lobe and lingula, suspicious for atypical infectious process such as atypical infectious bronchiolitis or fungal disease. 2. Borderline gallbladder wall thickening. Reviewing the patient's labs, this may well be due to the patient' s hypoalbuminemia and hypoproteinemia, although inflammation can also cause gallbladder wall thickening. 3.  Poor definition of the common bile duct which is mildly dilated. This is of uncertain significance. The patient's bilirubin is not elevated thus a biliary obstructive process is unlikely. Without liver enzyme elevation I doubt that the patient has cholangitis. This mild extrahepatic biliary dilatation might simply be incidental. 4. Sigmoid colon and descending colon diverticulosis. There is a small amount of free pelvic fluid but no mesenteric edema to suggest current active diverticulitis. 5. Scattered air-fluid levels in nondilated loops of jejunum. There is borderline fold thickening in these loops. The possibility of proximal enteritis is raised. 6.  Aortoiliac atherosclerotic vascular disease. 7. Average size 1.5 cm left adnexal cyst appears simple and does not require further workup. 8. Degenerative disc disease and spondylosis causing moderate right foraminal stenosis at L4-5. 9. Chondrocalcinosis in the pelvis, suggesting CPPD arthropathy.   Electronically Signed   By: Van Clines M.D.   On: 03/07/2015 21:24   I have reviewed abdominopelvic CT films with Dr. Thornton Papas; juvenile wall thickening appears to be due to poor distention.  Assessment; Acute on chronic nausea with sporadic vomiting in a patient with multiple medical problems. She also has anorexia and weight loss. Workup during this admission reveals normal transaminases and serum calcium, low serum albumin,  normal CBC and differential, abdominopelvic CT revealing CBD of 9 mm questionable GB wall thickening but ultrasound reveals no evidence of cholelithiasis or gallbladder wall thickening. She had EGD in April last year revealing small sliding hiatal hernia and gastritis but H pylori drawn and she was negative. Patient patient's symptom complex appears to be secondary to depression. Given history of hypothyroidism and chronic not cardiac use she may also have gastroparesis. Would like to confirm this diagnosis before starting promotility  agent. Given her presentation will also rule out Addison's disease. History of iron deficiency anemia possibly secondary to impaired iron absorption. She was evaluated last year and had multiple negative stool guaiacs and no lesion was found on EGD and colonoscopy. H. pylori serology was negative and due to biopsy was negative for celiac disease. She is receiving parenteral iron infusion and maintaining her H&H. She has very low serum albumin secondary to nephrotic syndrome and due to malnutrition.   Recommendations; Ondansetron 4 mg IV every morning in addition to when necessary dose. Fasting cortisol level in a.m. Solid-phase gastric emptying study in a.m. if this study is abnormal will treat her with metoclopramide until family is able to obtain domperidone. She does not respond to IV metoclopramide will consider repeating her EGD.   LOS: 1 day   REHMAN,NAJEEB U  03/08/2015, 4:47 PM

## 2015-03-09 ENCOUNTER — Inpatient Hospital Stay (HOSPITAL_COMMUNITY): Payer: Medicare Other

## 2015-03-09 ENCOUNTER — Encounter (HOSPITAL_COMMUNITY): Payer: Self-pay

## 2015-03-09 DIAGNOSIS — I480 Paroxysmal atrial fibrillation: Secondary | ICD-10-CM

## 2015-03-09 DIAGNOSIS — R112 Nausea with vomiting, unspecified: Secondary | ICD-10-CM

## 2015-03-09 LAB — BASIC METABOLIC PANEL
Anion gap: 6 (ref 5–15)
BUN: 11 mg/dL (ref 6–20)
CO2: 24 mmol/L (ref 22–32)
Calcium: 8.8 mg/dL — ABNORMAL LOW (ref 8.9–10.3)
Chloride: 111 mmol/L (ref 101–111)
Creatinine, Ser: 1.21 mg/dL — ABNORMAL HIGH (ref 0.44–1.00)
GFR calc non Af Amer: 41 mL/min — ABNORMAL LOW (ref >60)
GFR, EST AFRICAN AMERICAN: 48 mL/min — AB (ref >60)
Glucose, Bld: 96 mg/dL (ref 65–99)
Potassium: 4.1 mmol/L (ref 3.5–5.1)
Sodium: 141 mmol/L (ref 135–145)

## 2015-03-09 LAB — URINE CULTURE
Colony Count: 3000
Special Requests: NORMAL

## 2015-03-09 LAB — CORTISOL-AM, BLOOD: Cortisol - AM: 11.6 ug/dL (ref 6.7–22.6)

## 2015-03-09 MED ORDER — GABAPENTIN 100 MG PO CAPS
200.0000 mg | ORAL_CAPSULE | Freq: Two times a day (BID) | ORAL | Status: DC
  Administered 2015-03-09 – 2015-03-11 (×5): 200 mg via ORAL
  Filled 2015-03-09 (×5): qty 2

## 2015-03-09 MED ORDER — SIMETHICONE 80 MG PO CHEW
80.0000 mg | CHEWABLE_TABLET | Freq: Once | ORAL | Status: AC
  Administered 2015-03-09: 80 mg via ORAL
  Filled 2015-03-09: qty 1

## 2015-03-09 MED ORDER — HYDRALAZINE HCL 20 MG/ML IJ SOLN
10.0000 mg | Freq: Four times a day (QID) | INTRAMUSCULAR | Status: DC | PRN
  Administered 2015-03-09: 10 mg via INTRAVENOUS
  Filled 2015-03-09: qty 1

## 2015-03-09 MED ORDER — AMLODIPINE BESYLATE 5 MG PO TABS
10.0000 mg | ORAL_TABLET | Freq: Every day | ORAL | Status: DC
  Administered 2015-03-09 – 2015-03-11 (×3): 10 mg via ORAL
  Filled 2015-03-09 (×3): qty 2

## 2015-03-09 MED ORDER — TECHNETIUM TC 99M SULFUR COLLOID
2.0000 | Freq: Once | INTRAVENOUS | Status: AC | PRN
  Administered 2015-03-09: 2 via ORAL

## 2015-03-09 MED ORDER — DRONABINOL 2.5 MG PO CAPS
2.5000 mg | ORAL_CAPSULE | Freq: Two times a day (BID) | ORAL | Status: DC
  Administered 2015-03-09 – 2015-03-11 (×4): 2.5 mg via ORAL
  Filled 2015-03-09 (×4): qty 1

## 2015-03-09 MED ORDER — HYDROCODONE-ACETAMINOPHEN 5-325 MG PO TABS
1.0000 | ORAL_TABLET | ORAL | Status: DC | PRN
  Administered 2015-03-09: 1 via ORAL
  Administered 2015-03-09 – 2015-03-10 (×2): 2 via ORAL
  Administered 2015-03-10 – 2015-03-11 (×3): 1 via ORAL
  Administered 2015-03-11: 2 via ORAL
  Filled 2015-03-09 (×3): qty 1
  Filled 2015-03-09: qty 2
  Filled 2015-03-09: qty 1
  Filled 2015-03-09: qty 2
  Filled 2015-03-09: qty 1
  Filled 2015-03-09: qty 2

## 2015-03-09 MED ORDER — LISINOPRIL 10 MG PO TABS
40.0000 mg | ORAL_TABLET | Freq: Every day | ORAL | Status: DC
  Administered 2015-03-09 – 2015-03-11 (×3): 40 mg via ORAL
  Filled 2015-03-09 (×3): qty 4

## 2015-03-09 NOTE — Progress Notes (Signed)
Discussed case at length with Dr. Laural Golden last evening.   Patient remains nauseated but does not vomit. She denies abdominal pain. She was able to eat her mashed potatoes on her tray last evening without difficulty.   Patient has intermittent headache but no recent change. Has chronic right-sided tinnitus. Notes "thumping" sound in her right ear intermittently over the past couple of months  GES done today/results reviewed; normal gastric emptying with only 7% retention at 2 hours.      Vital signs in last 24 hours: Temp:  [98.1 F (36.7 C)-98.6 F (37 C)] 98.2 F (36.8 C) (06/04 0607) Pulse Rate:  [62-73] 73 (06/04 0710) Resp:  [18-20] 18 (06/04 0607) BP: (150-205)/(72-86) 173/81 mmHg (06/04 0710) SpO2:  [97 %-100 %] 99 % (06/04 0710) Last BM Date: 03/06/15 General:   Alert,  pleasant and cooperative in NAD. Somewhat anxious..  Accompanied by her daughter Abdomen: Nondistended.  Normal bowel sounds, without guarding, and without rebound.  No mass or organomegaly. Extremities:  Without clubbing or edema.    Intake/Output from previous day: 06/03 0701 - 06/04 0700 In: 240 [P.O.:240] Out: 600 [Urine:600] Intake/Output this shift:    Lab Results:  Recent Labs  03/07/15 1832 03/08/15 0718  WBC 7.8 6.9  HGB 12.6 11.9*  HCT 36.1 34.2*  PLT 246 212   BMET  Recent Labs  03/07/15 1832 03/08/15 0718 03/09/15 0549  NA 141 142 141  K 3.6 3.4* 4.1  CL 108 112* 111  CO2 23 23 24   GLUCOSE 120* 98 96  BUN 16 11 11   CREATININE 1.33* 1.17* 1.21*  CALCIUM 8.6* 8.3* 8.8*   LFT  Recent Labs  03/08/15 0718  PROT 4.8*  ALBUMIN 2.5*  AST 21  ALT 13*  ALKPHOS 55  BILITOT 0.7   Fasting cortisol pending   Studies/Results: Dg Chest 2 View  03/07/2015   CLINICAL DATA:  Intermittent fatigue.  Hypertension.  Nausea.  EXAM: CHEST  2 VIEW  COMPARISON:  01/07/2015; 07/13/2013  FINDINGS: Grossly unchanged borderline enlarged cardiac silhouette and mediastinal contours with  atherosclerotic plaque within the thoracic aorta. Stable position of support apparatus. The lungs remain hyperexpanded. Left basilar heterogeneous opacities are unchanged and favored to represent atelectasis or scar. No new focal airspace opacities. No pleural effusion pneumothorax. No evidence of edema. Stigmata of DISH within the caudal aspect of the thoracic spine.  IMPRESSION: Similar findings of borderline cardiomegaly and lung hyperexpansion without acute cardiopulmonary disease.   Electronically Signed   By: Sandi Mariscal M.D.   On: 03/07/2015 19:21   Nm Gastric Emptying  03/09/2015   CLINICAL DATA:  Early satiety, bloating.  EXAM: NUCLEAR MEDICINE GASTRIC EMPTYING SCAN  TECHNIQUE: After oral ingestion of radiolabeled meal, sequential abdominal images were obtained for 120 minutes. Residual percentage of activity remaining within the stomach was calculated at 60 and 120 minutes.  RADIOPHARMACEUTICALS:  2.0 mCi Technetium-26m labeled sulfur colloid orally  COMPARISON:  None.  FINDINGS: Expected location of the stomach in the left upper quadrant. Ingested meal empties the stomach gradually over the course of the study with 54%% retention at 60 min and 7%% retention at 120 min (normal retention less than 30% at a 120 min).  IMPRESSION: Normal gastric emptying study.   Electronically Signed   By: Suzy Bouchard M.D.   On: 03/09/2015 10:48   US Abdomen Complete  03/08/2015   CLINICAL DATA:  Cholecystitis, nausea, vomiting, history hypertension, hyperlipidemia, renal insufficiency  EXAM: ULTRASOUND ABDOMEN COMPLETE  COMPARISON:  CT abdomen and pelvis 03/07/2015  FINDINGS: Gallbladder: Well distended without stones or wall thickening. No pericholecystic fluid or sonographic Murphy sign.  Common bile duct: Diameter: 9 mm diameter, minimally prominent for age.  Liver: Normal appearance  IVC: Normal appearance  Pancreas: Normal appearance  Spleen: Poorly visualized due to high subcostal position, grossly normal  size 5.3 cm length.  Right Kidney: Length: 12.2 cm. Normal morphology without mass or hydronephrosis.  Left Kidney: Length: 10.0 cm. Normal morphology without mass or hydronephrosis.  Abdominal aorta: Obscured proximally by bowel gas. Visualized portions normal caliber.  Other findings: No free-fluid  IMPRESSION: No evidence of gallstones or sonographic features of acute cholecystitis.  Prominent CBD 9 mm diameter, recommend correlation with LFTs.   Electronically Signed   By: Lavonia Dana M.D.   On: 03/08/2015 09:12   Ct Abdomen Pelvis W Contrast  03/07/2015   CLINICAL DATA:  Hypertension. Fatigue and nausea. Renal insufficiency.  EXAM: CT ABDOMEN AND PELVIS WITH CONTRAST  TECHNIQUE: Multidetector CT imaging of the abdomen and pelvis was performed using the standard protocol following bolus administration of intravenous contrast.  CONTRAST:  26mL OMNIPAQUE IOHEXOL 300 MG/ML SOLN, 82mL OMNIPAQUE IOHEXOL 300 MG/ML SOLN  COMPARISON:  Multiple exams, including 05/17/2014  FINDINGS: Lower chest: Scattered small pulmonary nodules in the left lower lobe and lingula, somewhat clustered peripherally, measuring up to 4 mm in diameter.  Pacemaker leads observed.  Hepatobiliary: Borderline gallbladder wall thickening. Poor definition of the common bile duct which measures up to 9 mm diameter. This appears to taper in the vicinity of the ampulla.  Pancreas: Unremarkable  Spleen: Unremarkable  Adrenals/Urinary Tract: Unremarkable  Stomach/Bowel: Considerable sigmoid colon diverticulosis. There is a less degree of descending colon diverticulosis. Scattered air-fluid levels in nondilated loops of jejunum with borderline fold thickening.  Vascular/Lymphatic: Aortoiliac atherosclerotic vascular disease.  Reproductive: Uterus absent. 1.6 by 1.3 cm left adnexal cyst adjacent to the ovary. This has simple fluid density characteristics. Right ovary poorly seen.  Other: Trace free pelvic fluid.  Musculoskeletal: Degenerative disc  disease with loss of disc height at L4-5. There is moderate right foraminal stenosis at L4-5 due to right foraminal disc protrusion, intervertebral spurring, and facet spurring. Chondrocalcinosis in the pubic symphysis and in the acetabular labrum.  IMPRESSION: 1. Clustered small pulmonary nodules in the left lower lobe and lingula, suspicious for atypical infectious process such as atypical infectious bronchiolitis or fungal disease. 2. Borderline gallbladder wall thickening. Reviewing the patient's labs, this may well be due to the patient' s hypoalbuminemia and hypoproteinemia, although inflammation can also cause gallbladder wall thickening. 3. Poor definition of the common bile duct which is mildly dilated. This is of uncertain significance. The patient's bilirubin is not elevated thus a biliary obstructive process is unlikely. Without liver enzyme elevation I doubt that the patient has cholangitis. This mild extrahepatic biliary dilatation might simply be incidental. 4. Sigmoid colon and descending colon diverticulosis. There is a small amount of free pelvic fluid but no mesenteric edema to suggest current active diverticulitis. 5. Scattered air-fluid levels in nondilated loops of jejunum. There is borderline fold thickening in these loops. The possibility of proximal enteritis is raised. 6.  Aortoiliac atherosclerotic vascular disease. 7. Average size 1.5 cm left adnexal cyst appears simple and does not require further workup. 8. Degenerative disc disease and spondylosis causing moderate right foraminal stenosis at L4-5. 9. Chondrocalcinosis in the pelvis, suggesting CPPD arthropathy.   Electronically Signed   By: Van Clines M.D.   On:  03/07/2015 21:24    Assessment: Active Problems:   Paroxysmal atrial fibrillation   Pacemaker   Hypothyroidism   Nausea & vomiting   Malnutrition of moderate degree   Depression   CAP (community acquired pneumonia)   Chronic nausea with rare vomiting in  the setting of weight loss. She has had an extensive GI evaluation to date. Fasting cortisol remains pending. Normal gastric emptying - Would stay away from metoclopramide in this patient  Recommendations:  Follow-up on fasting cortisol. Empiric trial of low-dose Marinol (i.e. 2.5 mg orally twice daily). I have discussed with Ronalee Belts in pharmacy.  No apparent drug interactions or contraindication in this patient.   I have discussed this line of empiric therapy with patient and daughter. They are interested in trying it.  Will begin Marinol 2.5 mg twice daily. If cortisol comes back normal, would consider a head CT to complete her inpatient evaluation. Nausea associated with depression may be the main culprit here.

## 2015-03-09 NOTE — Progress Notes (Signed)
Patients BP this morning is 205/86, paged on-call MD. Will follow orders given and continue to monitor the patient.

## 2015-03-09 NOTE — Progress Notes (Signed)
[facility] PROGRESS NOTE  Kelsey Sandoval [id number] DOB: [birth year] DOA: 03/07/2015 PCP: Sallee Lange, MD  Assessment/Plan: 1. Nausea and vomiting. Abdominal CT as well as abdominal ultrasound have been unrevealing. It does not appear that she has a cholecystitis or enteritis. It is possible that her nausea and vomiting may be related to depression. She underwent gastric emptying study which was found to be normal. Appreciate GI assistance. Started on marinol for symptom management. Cortisol pending. 2. Possible community-acquired pneumonia. CT of the abdomen and pelvis indicated left lower lobe lung nodule which indicates possible atypical infection. She is on antibiotic therapy with Levaquin. She will likely need repeat imaging in 4-6 weeks. 3. History of nephritis. She received rituximab at [facility]. This can be followed as an outpatient. Would not administer rituximab at this time. 4. Depression. Patient is somewhat tearful today and describes that she has many stressors in her life. She describes frequent crying spells and anhedonia. She has been started on Celexa. 5. Fatigue. Likely related to depression. Will likely need home health PT on discharge 6. History of atrial fibrillation. Continue on metoprolol as well as pradaxa. She is also on flecainide. 7. Hypothyroidism. TSH has improved with Synthroid therapy. Continue current treatments.  Code Status: full code Family Communication: discussed with patient and daughter at the bedside Disposition Plan: discharge home once improved   Consultants:  Gastroenterology  Procedures:    Antibiotics:  levaquin 6/3>>  HPI/Subjective: Ate some grits this morning. Felt short of breath while walking. Still has some nausea. Complains of burning pain in her legs bilaterally  Objective: Filed Vitals:   03/09/15 0710  BP: 173/81  Pulse: 73  Temp:   Resp:     Intake/Output Summary (Last 24 hours) at 03/09/15 1348 Last data filed  at 03/09/15 0900  Gross per 24 hour  Intake    240 ml  Output    600 ml  Net   -360 ml   Filed Weights   03/07/15 1635 03/08/15 0058  Weight: 68.04 kg (150 lb) 67.994 kg (149 lb 14.4 oz)    Exam:   General:  NAD  Cardiovascular: s1, s2, regular  Respiratory: clear bilaterally  Abdomen: soft, nt, nd, bs+  Musculoskeletal: no edema b/l   Data Reviewed: Basic Metabolic Panel:  Recent Labs Lab 03/07/15 1832 03/08/15 0718 03/09/15 0549  NA 141 142 141  K 3.6 3.4* 4.1  CL 108 112* 111  CO2 23 23 24   GLUCOSE 120* 98 96  BUN 16 11 11   CREATININE 1.33* 1.17* 1.21*  CALCIUM 8.6* 8.3* 8.8*   Liver Function Tests:  Recent Labs Lab 03/07/15 1832 03/08/15 0718  AST 26 21  ALT 16 13*  ALKPHOS 67 55  BILITOT 0.4 0.7  PROT 5.7* 4.8*  ALBUMIN 3.1* 2.5*   No results for input(s): LIPASE, AMYLASE in the last 168 hours. No results for input(s): AMMONIA in the last 168 hours. CBC:  Recent Labs Lab 03/07/15 1832 03/08/15 0718  WBC 7.8 6.9  NEUTROABS 5.4  --   HGB 12.6 11.9*  HCT 36.1 34.2*  MCV 91.6 92.2  PLT 246 212   Cardiac Enzymes:  Recent Labs Lab 03/07/15 1832  TROPONINI <0.03   BNP (last 3 results) No results for input(s): BNP in the last 8760 hours.  ProBNP (last 3 results) No results for input(s): PROBNP in the last 8760 hours.  CBG:  Recent Labs Lab 03/08/15 2230  GLUCAP 126*    No results found for this  or any previous visit (from the past 240 hour(s)).   Studies: Dg Chest 2 View  03/07/2015   CLINICAL DATA:  Intermittent fatigue.  Hypertension.  Nausea.  EXAM: CHEST  2 VIEW  COMPARISON:  01/07/2015; 07/13/2013  FINDINGS: Grossly unchanged borderline enlarged cardiac silhouette and mediastinal contours with atherosclerotic plaque within the thoracic aorta. Stable position of support apparatus. The lungs remain hyperexpanded. Left basilar heterogeneous opacities are unchanged and favored to represent atelectasis or scar. No new focal  airspace opacities. No pleural effusion pneumothorax. No evidence of edema. Stigmata of DISH within the caudal aspect of the thoracic spine.  IMPRESSION: Similar findings of borderline cardiomegaly and lung hyperexpansion without acute cardiopulmonary disease.   Electronically Signed   By: Sandi Mariscal M.D.   On: 03/07/2015 19:21   Nm Gastric Emptying  03/09/2015   CLINICAL DATA:  Early satiety, bloating.  EXAM: NUCLEAR MEDICINE GASTRIC EMPTYING SCAN  TECHNIQUE: After oral ingestion of radiolabeled meal, sequential abdominal images were obtained for 120 minutes. Residual percentage of activity remaining within the stomach was calculated at 60 and 120 minutes.  RADIOPHARMACEUTICALS:  2.0 mCi Technetium-50m labeled sulfur colloid orally  COMPARISON:  None.  FINDINGS: Expected location of the stomach in the left upper quadrant. Ingested meal empties the stomach gradually over the course of the study with 54%% retention at 60 min and 7%% retention at 120 min (normal retention less than 30% at a 120 min).  IMPRESSION: Normal gastric emptying study.   Electronically Signed   By: Suzy Bouchard M.D.   On: 03/09/2015 10:48   US Abdomen Complete  03/08/2015   CLINICAL DATA:  Cholecystitis, nausea, vomiting, history hypertension, hyperlipidemia, renal insufficiency  EXAM: ULTRASOUND ABDOMEN COMPLETE  COMPARISON:  CT abdomen and pelvis 03/07/2015  FINDINGS: Gallbladder: Well distended without stones or wall thickening. No pericholecystic fluid or sonographic Murphy sign.  Common bile duct: Diameter: 9 mm diameter, minimally prominent for age.  Liver: Normal appearance  IVC: Normal appearance  Pancreas: Normal appearance  Spleen: Poorly visualized due to high subcostal position, grossly normal size 5.3 cm length.  Right Kidney: Length: 12.2 cm. Normal morphology without mass or hydronephrosis.  Left Kidney: Length: 10.0 cm. Normal morphology without mass or hydronephrosis.  Abdominal aorta: Obscured proximally by bowel  gas. Visualized portions normal caliber.  Other findings: No free-fluid  IMPRESSION: No evidence of gallstones or sonographic features of acute cholecystitis.  Prominent CBD 9 mm diameter, recommend correlation with LFTs.   Electronically Signed   By: Lavonia Dana M.D.   On: 03/08/2015 09:12   Ct Abdomen Pelvis W Contrast  03/07/2015   CLINICAL DATA:  Hypertension. Fatigue and nausea. Renal insufficiency.  EXAM: CT ABDOMEN AND PELVIS WITH CONTRAST  TECHNIQUE: Multidetector CT imaging of the abdomen and pelvis was performed using the standard protocol following bolus administration of intravenous contrast.  CONTRAST:  81mL OMNIPAQUE IOHEXOL 300 MG/ML SOLN, 24mL OMNIPAQUE IOHEXOL 300 MG/ML SOLN  COMPARISON:  Multiple exams, including 05/17/2014  FINDINGS: Lower chest: Scattered small pulmonary nodules in the left lower lobe and lingula, somewhat clustered peripherally, measuring up to 4 mm in diameter.  Pacemaker leads observed.  Hepatobiliary: Borderline gallbladder wall thickening. Poor definition of the common bile duct which measures up to 9 mm diameter. This appears to taper in the vicinity of the ampulla.  Pancreas: Unremarkable  Spleen: Unremarkable  Adrenals/Urinary Tract: Unremarkable  Stomach/Bowel: Considerable sigmoid colon diverticulosis. There is a less degree of descending colon diverticulosis. Scattered air-fluid levels in nondilated loops  of jejunum with borderline fold thickening.  Vascular/Lymphatic: Aortoiliac atherosclerotic vascular disease.  Reproductive: Uterus absent. 1.6 by 1.3 cm left adnexal cyst adjacent to the ovary. This has simple fluid density characteristics. Right ovary poorly seen.  Other: Trace free pelvic fluid.  Musculoskeletal: Degenerative disc disease with loss of disc height at L4-5. There is moderate right foraminal stenosis at L4-5 due to right foraminal disc protrusion, intervertebral spurring, and facet spurring. Chondrocalcinosis in the pubic symphysis and in the  acetabular labrum.  IMPRESSION: 1. Clustered small pulmonary nodules in the left lower lobe and lingula, suspicious for atypical infectious process such as atypical infectious bronchiolitis or fungal disease. 2. Borderline gallbladder wall thickening. Reviewing the patient's labs, this may well be due to the patient' s hypoalbuminemia and hypoproteinemia, although inflammation can also cause gallbladder wall thickening. 3. Poor definition of the common bile duct which is mildly dilated. This is of uncertain significance. The patient's bilirubin is not elevated thus a biliary obstructive process is unlikely. Without liver enzyme elevation I doubt that the patient has cholangitis. This mild extrahepatic biliary dilatation might simply be incidental. 4. Sigmoid colon and descending colon diverticulosis. There is a small amount of free pelvic fluid but no mesenteric edema to suggest current active diverticulitis. 5. Scattered air-fluid levels in nondilated loops of jejunum. There is borderline fold thickening in these loops. The possibility of proximal enteritis is raised. 6.  Aortoiliac atherosclerotic vascular disease. 7. Average size 1.5 cm left adnexal cyst appears simple and does not require further workup. 8. Degenerative disc disease and spondylosis causing moderate right foraminal stenosis at L4-5. 9. Chondrocalcinosis in the pelvis, suggesting CPPD arthropathy.   Electronically Signed   By: Van Clines M.D.   On: 03/07/2015 21:24    Scheduled Meds: . citalopram  20 mg Oral Daily  . dabigatran  150 mg Oral BID  . Difluprednate  1 drop Left Eye TID  . dronabinol  2.5 mg Oral BID AC  . feeding supplement (ENSURE ENLIVE)  237 mL Oral BID BM  . flecainide  50 mg Oral BID  . gabapentin  200 mg Oral BID  . ketorolac  1 drop Left Eye Daily  . levofloxacin (LEVAQUIN) IV  750 mg Intravenous Q48H  . levothyroxine  25 mcg Oral QAC breakfast  . metoprolol succinate  100 mg Oral Daily  . ondansetron  (ZOFRAN) IV  4 mg Intravenous Q0600  . pantoprazole  40 mg Oral Daily  . pravastatin  80 mg Oral Daily   Continuous Infusions: . sodium chloride 75 mL/hr at 03/08/15 1307    Active Problems:   Paroxysmal atrial fibrillation   Pacemaker   Hypothyroidism   Nausea & vomiting   Malnutrition of moderate degree   Depression   CAP (community acquired pneumonia)    Time spent: 67mins    MEMON,JEHANZEB  [facility] Pager [phone]. If 7PM-7AM, please contact night-coverage at [email or web address], password [facility] 03/09/2015, 1:48 PM  LOS: 2 days

## 2015-03-09 NOTE — Progress Notes (Signed)
Pt requesting something for gas. Mid-level notified. Will follow orders as advised.

## 2015-03-09 NOTE — Progress Notes (Signed)
Pt complaining of Diarrhea. Mid-level notified, will follow orders as advised.

## 2015-03-10 ENCOUNTER — Inpatient Hospital Stay (HOSPITAL_COMMUNITY): Payer: Medicare Other

## 2015-03-10 DIAGNOSIS — R112 Nausea with vomiting, unspecified: Secondary | ICD-10-CM | POA: Insufficient documentation

## 2015-03-10 LAB — BASIC METABOLIC PANEL
Anion gap: 5 (ref 5–15)
BUN: 17 mg/dL (ref 6–20)
CALCIUM: 8.3 mg/dL — AB (ref 8.9–10.3)
CO2: 24 mmol/L (ref 22–32)
CREATININE: 1.28 mg/dL — AB (ref 0.44–1.00)
Chloride: 112 mmol/L — ABNORMAL HIGH (ref 101–111)
GFR calc Af Amer: 45 mL/min — ABNORMAL LOW (ref >60)
GFR, EST NON AFRICAN AMERICAN: 39 mL/min — AB (ref >60)
Glucose, Bld: 109 mg/dL — ABNORMAL HIGH (ref 65–99)
Potassium: 4 mmol/L (ref 3.5–5.1)
Sodium: 141 mmol/L (ref 135–145)

## 2015-03-10 LAB — CLOSTRIDIUM DIFFICILE BY PCR: CDIFFPCR: POSITIVE — AB

## 2015-03-10 MED ORDER — DEXTROSE-NACL 5-0.45 % IV SOLN
INTRAVENOUS | Status: DC
  Administered 2015-03-10 – 2015-03-11 (×2): via INTRAVENOUS

## 2015-03-10 MED ORDER — METRONIDAZOLE 500 MG PO TABS
500.0000 mg | ORAL_TABLET | Freq: Three times a day (TID) | ORAL | Status: DC
  Administered 2015-03-10 – 2015-03-11 (×3): 500 mg via ORAL
  Filled 2015-03-10 (×3): qty 1

## 2015-03-10 MED ORDER — DEXTROSE-NACL 5-0.45 % IV SOLN: INTRAVENOUS | Status: DC

## 2015-03-10 NOTE — Progress Notes (Signed)
[facility] PROGRESS NOTE  Kelsey Sandoval [id number] DOB: November 25, 1932 DOA: 03/07/2015 PCP: Kelsey Lange, MD  Assessment/Plan: 1. Nausea and vomiting. Abdominal CT as well as abdominal ultrasound have been unrevealing. It does not appear that she has a cholecystitis or enteritis. It is possible that her nausea and vomiting may be related to depression. She underwent gastric emptying study which was found to be normal. Appreciate GI assistance. Started on marinol for symptom management. Cortisol normal. Will order CT head to complete inpatient work up 2. Possible community-acquired pneumonia. CT of the abdomen and pelvis indicated left lower lobe lung nodule which indicates possible atypical infection. She is on antibiotic therapy with Levaquin. She will likely need repeat imaging in 4-6 weeks. 3. History of nephritis. She received rituximab at [facility]. This can be followed as an outpatient. Would not administer rituximab at this time. 4. Depression. Patient is somewhat tearful today and describes that she has many stressors in her life. She describes frequent crying spells and anhedonia. She has been started on Celexa. 5. Fatigue. Likely related to depression. Will likely need home health PT on discharge 6. History of atrial fibrillation. Continue on metoprolol as well as pradaxa. She is also on flecainide. 7. Hypothyroidism. TSH has improved with Synthroid therapy. Continue current treatments.  Code Status: full code Family Communication: discussed with patient and daughter at the bedside Disposition Plan: discharge home once improved   Consultants:  Gastroenterology  Procedures:    Antibiotics:  levaquin 6/3>>  HPI/Subjective: Has continued nausea, worse in the morning. Had some loose stools overnight  Objective: Filed Vitals:   03/10/15 0620  BP: 151/70  Pulse: 64  Temp: 98.3 F (36.8 C)  Resp: 20    Intake/Output Summary (Last 24 hours) at 03/10/15 1054 Last data  filed at 03/10/15 0536  Gross per 24 hour  Intake   1950 ml  Output   1053 ml  Net    897 ml   Filed Weights   03/07/15 1635 03/08/15 0058  Weight: 68.04 kg (150 lb) 67.994 kg (149 lb 14.4 oz)    Exam:   General:  NAD, sitting up in bed  Cardiovascular: s1, s2, RRR  Respiratory: CTA B  Abdomen: soft, nt, nd, bs+  Musculoskeletal: no edema b/l   Data Reviewed: Basic Metabolic Panel:  Recent Labs Lab 03/07/15 1832 03/08/15 0718 03/09/15 0549 03/10/15 0600  NA 141 142 141 141  K 3.6 3.4* 4.1 4.0  CL 108 112* 111 112*  CO2 23 23 24 24   GLUCOSE 120* 98 96 109*  BUN 16 11 11 17   CREATININE 1.33* 1.17* 1.21* 1.28*  CALCIUM 8.6* 8.3* 8.8* 8.3*   Liver Function Tests:  Recent Labs Lab 03/07/15 1832 03/08/15 0718  AST 26 21  ALT 16 13*  ALKPHOS 67 55  BILITOT 0.4 0.7  PROT 5.7* 4.8*  ALBUMIN 3.1* 2.5*   No results for input(s): LIPASE, AMYLASE in the last 168 hours. No results for input(s): AMMONIA in the last 168 hours. CBC:  Recent Labs Lab 03/07/15 1832 03/08/15 0718  WBC 7.8 6.9  NEUTROABS 5.4  --   HGB 12.6 11.9*  HCT 36.1 34.2*  MCV 91.6 92.2  PLT 246 212   Cardiac Enzymes:  Recent Labs Lab 03/07/15 1832  TROPONINI <0.03   BNP (last 3 results) No results for input(s): BNP in the last 8760 hours.  ProBNP (last 3 results) No results for input(s): PROBNP in the last 8760 hours.  CBG:  Recent Labs  Lab 03/08/15 2230  GLUCAP 126*    Recent Results (from the past 240 hour(s))  Urine culture     Status: None   Collection Time: 03/07/15  9:58 PM  Result Value Ref Range Status   Specimen Description URINE, CLEAN CATCH  Final   Special Requests Normal  Final   Colony Count   Final    3,000 COLONIES/ML Performed at [facility]    Culture   Final    INSIGNIFICANT GROWTH Performed at [facility]    Report Status 03/09/2015 FINAL  Final     Studies: Nm Gastric Emptying  03/09/2015   CLINICAL DATA:  Early  satiety, bloating.  EXAM: NUCLEAR MEDICINE GASTRIC EMPTYING SCAN  TECHNIQUE: After oral ingestion of radiolabeled meal, sequential abdominal images were obtained for 120 minutes. Residual percentage of activity remaining within the stomach was calculated at 60 and 120 minutes.  RADIOPHARMACEUTICALS:  2.0 mCi Technetium-86m labeled sulfur colloid orally  COMPARISON:  None.  FINDINGS: Expected location of the stomach in the left upper quadrant. Ingested meal empties the stomach gradually over the course of the study with 54%% retention at 60 min and 7%% retention at 120 min (normal retention less than 30% at a 120 min).  IMPRESSION: Normal gastric emptying study.   Electronically Signed   By: Kelsey Sandoval M.D.   On: 03/09/2015 10:48    Scheduled Meds: . amLODipine  10 mg Oral Daily  . citalopram  20 mg Oral Daily  . dabigatran  150 mg Oral BID  . Difluprednate  1 drop Left Eye TID  . dronabinol  2.5 mg Oral BID AC  . feeding supplement (ENSURE ENLIVE)  237 mL Oral BID BM  . flecainide  50 mg Oral BID  . gabapentin  200 mg Oral BID  . ketorolac  1 drop Left Eye Daily  . levofloxacin (LEVAQUIN) IV  750 mg Intravenous Q48H  . levothyroxine  25 mcg Oral QAC breakfast  . lisinopril  40 mg Oral Daily  . metoprolol succinate  100 mg Oral Daily  . ondansetron (ZOFRAN) IV  4 mg Intravenous Q0600  . pantoprazole  40 mg Oral Daily  . pravastatin  80 mg Oral Daily   Continuous Infusions: . dextrose 5 % and 0.45% NaCl      Active Problems:   Paroxysmal atrial fibrillation   Pacemaker   Hypothyroidism   Nausea & vomiting   Malnutrition of moderate degree   Depression   CAP (community acquired pneumonia)    Time spent: 34mins    Kelsey Sandoval  [facility] Pager [phone]. If 7PM-7AM, please contact night-coverage at [email or web address], password [facility] 03/10/2015, 10:54 AM  LOS: 3 days

## 2015-03-10 NOTE — Progress Notes (Signed)
MD placed patient on enteric precautions d/t pt complaining of diarrhea. Pt reported 2 loose stools but has not had anymore diarrhea since those two episodes. Will obtain sample and send to lab when a sample is provided.

## 2015-03-10 NOTE — Progress Notes (Addendum)
Patient denies any nausea, whatsoever, today. She pretty much ate everything on her breakfast tray and finished off her lunch tray as I walked into her room today. No abdominal pain. A couple of loose stools over the past 24 hours.  Does complain of some "wooziness" / dizziness today, especially when she closes her eyes.  Fasting cortisol 11.6.  Vital signs in last 24 hours: Temp:  [98.3 F (36.8 C)] 98.3 F (36.8 C) (06/05 0620) Pulse Rate:  [64-68] 64 (06/05 0620) Resp:  [20] 20 (06/05 0620) BP: (151-152)/(70-97) 151/70 mmHg (06/05 0620) SpO2:  [97 %] 97 % (06/05 0620) Last BM Date: 03/09/15 General:   Alert,  pleasant and cooperative in NAD; accompanied by family. Abdomen:  nontender and nondistended.  Normal bowel sounds, without guarding, and without rebound.  No mass or organomegaly. Extremities:  Without clubbing or edema.    Intake/Output from previous day: 06/04 0701 - 06/05 0700 In: 1950 [P.O.:480; I.V.:1470] Out: 1053 [Urine:1050; Stool:3] Intake/Output this shift:    Lab Results:  Recent Labs  03/07/15 1832 03/08/15 0718  WBC 7.8 6.9  HGB 12.6 11.9*  HCT 36.1 34.2*  PLT 246 212   BMET  Recent Labs  03/08/15 0718 03/09/15 0549 03/10/15 0600  NA 142 141 141  K 3.4* 4.1 4.0  CL 112* 111 112*  CO2 23 24 24   GLUCOSE 98 96 109*  BUN 11 11 17   CREATININE 1.17* 1.21* 1.28*  CALCIUM 8.3* 8.8* 8.3*   LFT  Recent Labs  03/08/15 0718  PROT 4.8*  ALBUMIN 2.5*  AST 21  ALT 13*  ALKPHOS 55  BILITOT 0.7   PT/INR No results for input(s): LABPROT, INR in the last 72 hours. Hepatitis Panel No results for input(s): HEPBSAG, HCVAB, HEPAIGM, HEPBIGM in the last 72 hours. C-Diff No results for input(s): CDIFFTOX in the last 72 hours.  Studies/Results: Nm Gastric Emptying  03/09/2015   CLINICAL DATA:  Early satiety, bloating.  EXAM: NUCLEAR MEDICINE GASTRIC EMPTYING SCAN  TECHNIQUE: After oral ingestion of radiolabeled meal, sequential abdominal images  were obtained for 120 minutes. Residual percentage of activity remaining within the stomach was calculated at 60 and 120 minutes.  RADIOPHARMACEUTICALS:  2.0 mCi Technetium-68m labeled sulfur colloid orally  COMPARISON:  None.  FINDINGS: Expected location of the stomach in the left upper quadrant. Ingested meal empties the stomach gradually over the course of the study with 54%% retention at 60 min and 7%% retention at 120 min (normal retention less than 30% at a 120 min).  IMPRESSION: Normal gastric emptying study.   Electronically Signed   By: Suzy Bouchard M.D.   On: 03/09/2015 10:48    Assessment: Active Problems:   Paroxysmal atrial fibrillation   Pacemaker   Hypothyroidism   Nausea & vomiting   Malnutrition of moderate degree   Depression   CAP (community acquired pneumonia)  LOS: 3 days   Nausea improved today. Oral intake pretty good so far today. Question Marinol effect.   Complains of dizziness/ "wooziness". Agree with head CT to wrap up the inpatient GI evaluation. Follow-up on C. difficile toxin assay. Dr. Laural Golden will be in to reassess tomorrow morning.     Manus Rudd  03/10/2015, 12:55 PM

## 2015-03-11 ENCOUNTER — Other Ambulatory Visit: Payer: Self-pay | Admitting: Family Medicine

## 2015-03-11 DIAGNOSIS — R112 Nausea with vomiting, unspecified: Secondary | ICD-10-CM | POA: Insufficient documentation

## 2015-03-11 DIAGNOSIS — R1110 Vomiting, unspecified: Secondary | ICD-10-CM

## 2015-03-11 DIAGNOSIS — E440 Moderate protein-calorie malnutrition: Secondary | ICD-10-CM

## 2015-03-11 MED ORDER — CITALOPRAM HYDROBROMIDE 20 MG PO TABS: 20.0000 mg | ORAL_TABLET | Freq: Every day | ORAL | Status: DC

## 2015-03-11 MED ORDER — LEVOFLOXACIN 750 MG PO TABS: 750.0000 mg | ORAL_TABLET | ORAL | Status: DC

## 2015-03-11 MED ORDER — ONDANSETRON 8 MG PO TBDP: 8.0000 mg | ORAL_TABLET | Freq: Three times a day (TID) | ORAL | Status: DC | PRN

## 2015-03-11 MED ORDER — GABAPENTIN 100 MG PO CAPS: 200.0000 mg | ORAL_CAPSULE | Freq: Two times a day (BID) | ORAL | Status: DC

## 2015-03-11 MED ORDER — SODIUM CHLORIDE 0.9 % IV SOLN: INTRAVENOUS | Status: DC

## 2015-03-11 MED ORDER — SACCHAROMYCES BOULARDII 250 MG PO CAPS: 250.0000 mg | ORAL_CAPSULE | Freq: Two times a day (BID) | ORAL | Status: DC

## 2015-03-11 MED ORDER — DRONABINOL 2.5 MG PO CAPS: 2.5000 mg | ORAL_CAPSULE | Freq: Two times a day (BID) | ORAL | Status: DC

## 2015-03-11 MED ORDER — FAMOTIDINE 20 MG PO TABS
20.0000 mg | ORAL_TABLET | Freq: Two times a day (BID) | ORAL | Status: DC
  Administered 2015-03-11: 20 mg via ORAL
  Filled 2015-03-11: qty 1

## 2015-03-11 MED ORDER — FAMOTIDINE 20 MG PO TABS: 20.0000 mg | ORAL_TABLET | Freq: Two times a day (BID) | ORAL | Status: DC

## 2015-03-11 MED ORDER — SIMETHICONE 80 MG PO CHEW
160.0000 mg | CHEWABLE_TABLET | Freq: Four times a day (QID) | ORAL | Status: DC | PRN
  Filled 2015-03-11: qty 2

## 2015-03-11 MED ORDER — VITAMIN B-12 1000 MCG PO TABS: 1000.0000 ug | ORAL_TABLET | Freq: Every day | ORAL | Status: DC

## 2015-03-11 MED ORDER — METRONIDAZOLE 500 MG PO TABS: 500.0000 mg | ORAL_TABLET | Freq: Three times a day (TID) | ORAL | Status: DC

## 2015-03-11 NOTE — Care Management Note (Deleted)
Case Management Note  Patient Details  Name: DANIAL FLIPPIN MRN: [id number] Date of Birth: Mar 17, 1933  Expected Discharge Date:                  Expected Discharge Plan:  Home/Self Care  In-House Referral:  NA  Discharge planning Services  CM Consult  Post Acute Care Choice:  NA Choice offered to:  NA  DME Arranged:    DME Agency:     HH Arranged:    [place] Agency:     Status of Service:  Completed, signed off  Medicare Important Message Given:  Yes Date Medicare IM Given:  03/08/15 Medicare IM give by:  Jolene Provost, RN, MSN, CM Date Additional Medicare IM Given:  03/11/15 Additional Medicare Important Message give by:  Jolene Provost, RN, MSN, CM   If discussed at Long Length of Stay Meetings, dates discussed:    Additional Comments: Pt plans to discharge home with self care. MD anticipates discharge today. No CM needs at this time.   Sherald Barge, RN 03/11/2015, 1:48 PM

## 2015-03-11 NOTE — Progress Notes (Signed)
  Subjective:  Patient feels better today. She ate 100% for lunch today. She denies nausea abdominal pain or diarrhea. She had one loose stool yesterday. Prior to hospitalization on she was passing was [place] pieces of formed stools are little balls.   Objective: Blood pressure 152/61, pulse 68, temperature 97.8 F (36.6 C), temperature source Oral, resp. rate 20, height 5\' 9"  (1.753 m), weight 149 lb 14.4 oz (67.994 kg), SpO2 95 %. Patient is alert and in no acute distress. Abdomen is soft and nontender without organomegaly or masses. No LE edema or clubbing noted.  Labs/studies Results:   fasting cortisol level 11.6 which is within normal limits   Recent Labs  03/09/15 0549 03/10/15 0600  NA 141 141  K 4.1 4.0  CL 111 112*  CO2 24 24  GLUCOSE 96 109*  BUN 11 17  CREATININE 1.21* 1.28*  CALCIUM 8.8* 8.3*     Unenhanced head CT results noted. No acute abnormalities. CT reveals changes due to small vessel disease.  Assessment:  #1. Nausea and anorexia. She is doing better with current therapy which includes ondansetron every morning and Marinol. She is also on an anti-depressant which will take few weeks before to work. No abnormality noted on head CT to account for symptoms. #2. Positive C. difficile by PCR. Patient has been on anti-biotic since admission. She had one loose stool yesterday. She appears to be in preclinical stage. She was begun on metronidazole earlier today. Discussed with Dr. Roderic Palau. She will need two more days of Levaquin.  Recommendations:  Continue ondansetron by mouth every morning. Continue metronidazole for 10 days. Florastor 1 capsule by mouth daily for 1 month. Continue Marinol at current dose. Office visit in 3-4 weeks. Office will call patient.

## 2015-03-11 NOTE — Progress Notes (Signed)
Patient discharged with instructions given on medications,and follow up visits,patient verbalized understanding. Prescriptions sent with patient. No c/o pain or discomfort noted. Accompanied by staff to an awaiting vehicle.

## 2015-03-11 NOTE — Progress Notes (Signed)
Lab called with positive C-Diff results. Mid-level paged and orders given.

## 2015-03-11 NOTE — Discharge Summary (Addendum)
Physician Discharge Summary  Kelsey Sandoval [id number] DOB: 20-Dec-1932 DOA: 03/07/2015  PCP: Sallee Lange, MD  Admit date: 03/07/2015 Discharge date: 03/11/2015  Time spent: 40 minutes  Recommendations for Outpatient Follow-up:  1. Follow up with primary care physician in 1-2 weeks 2. Patient has been given information regarding scheduling psychiatry referral for depression 3. Follow up at [facility] for further rituximab infusion once cleared from infectious stand point 4. Repeat chest CT with contrast in 6-8 weeks to evaluate for pneumonia 5. Patient started on gabapentin for peripheral neuropathy in legs 6. Started on Marinol for anorexia/nausea. 7. Follow up with Dr. Laural Golden as scheduled  Discharge Diagnoses:  Active Problems:   Paroxysmal atrial fibrillation   Pacemaker   Hypothyroidism   Nausea & vomiting   Malnutrition of moderate degree   Depression   CAP (community acquired pneumonia)   Nausea with vomiting   Intractable nausea and vomiting Clostridium difficile positive stools  Discharge Condition: improving  Diet recommendation: low salt  Filed Weights   03/07/15 1635 03/08/15 0058  Weight: 68.04 kg (150 lb) 67.994 kg (149 lb 14.4 oz)    History of present illness:  This patient was admitted to the hospital with persistent nausea, vomiting and fatigue. She was evaluated in the emergency room or CT scan of the abdomen and pelvis indicated possible gallbladder wall thickening and therefore she was admitted for further treatment.  Hospital Course:  1. Nausea and vomiting. Abdominal CT as well as abdominal ultrasound have been unrevealing. It does not appear that she truly has a cholecystitis or enteritis. It is possible that her nausea and vomiting may be related to depression. She underwent gastric emptying study which was found to be normal. CT head was also unremarkable. Appreciate GI assistance. She was started on marinol for symptom management and has had some  improvement. Cortisol normal.  2. Possible community-acquired pneumonia. CT of the abdomen and pelvis indicated left lower lobe lung nodule which indicates possible atypical infection. This was discussed with pulmonology and recommendations were to treat the patient with a course of antibiotic. Patient was started on Levaquin. She will need repeat imaging in 6-8 weeks to ensure resolution 3. History of nephritis. She received rituximab at [facility]. This can be followed as an outpatient. Would not administer rituximab at this time until she is cleared from an infectious standpoint. 4. Depression. Patient was somewhat tearful and describeed that she has many stressors in her life. She described frequent crying spells and anhedonia. She was started on Celexa and will be referred to psychiatry. 5. Fatigue. Likely related to depression. She has been set up with home health PT on discharge 6. History of atrial fibrillation. Continued on metoprolol as well as pradaxa. She is also on flecainide. 7. Hypothyroidism. TSH had improved with Synthroid therapy. Continue current treatments. 8. Clostridium difficile positive stools. Patient did have 2 stools, that weren't particularly loose. C. difficile was sent and returned positive. It is unclear whether this is a true infection versus false positive since she is not having any significant diarrhea. In any case, she'll be treated with 2 weeks of metronidazole.  Procedures:    Consultations:  Gastroenterology, Dr. Laural Golden  Discharge Exam: Filed Vitals:   03/11/15 1459  BP: 134/65  Pulse: 67  Temp: 97.6 F (36.4 C)  Resp: 20    General: NAD Cardiovascular: S1, S2 RRR Respiratory: CTA B  Discharge Instructions   Discharge Instructions    Diet - low sodium heart healthy  Complete by:  As directed      Increase activity slowly    Complete by:  As directed           Discharge Medication List as of 03/11/2015  3:53 PM    START taking these  medications   Details  citalopram (CELEXA) 20 MG tablet Take 1 tablet (20 mg total) by mouth daily., Starting 03/11/2015, Until Discontinued, Print    dronabinol (MARINOL) 2.5 MG capsule Take 1 capsule (2.5 mg total) by mouth 2 (two) times daily before lunch and supper., Starting 03/11/2015, Until Discontinued, Print    famotidine (PEPCID) 20 MG tablet Take 1 tablet (20 mg total) by mouth 2 (two) times daily., Starting 03/11/2015, Until Discontinued, Print    gabapentin (NEURONTIN) 100 MG capsule Take 2 capsules (200 mg total) by mouth 2 (two) times daily., Starting 03/11/2015, Until Discontinued, Print    levofloxacin (LEVAQUIN) 750 MG tablet Take 1 tablet (750 mg total) by mouth every other day., Starting 03/12/2015, Until Discontinued, Print    metroNIDAZOLE (FLAGYL) 500 MG tablet Take 1 tablet (500 mg total) by mouth every 8 (eight) hours., Starting 03/11/2015, Until Discontinued, Print    saccharomyces boulardii (FLORASTOR) 250 MG capsule Take 1 capsule (250 mg total) by mouth 2 (two) times daily., Starting 03/11/2015, Until Discontinued, Print      CONTINUE these medications which have CHANGED   Details  ondansetron (ZOFRAN-ODT) 8 MG disintegrating tablet Take 1 tablet (8 mg total) by mouth every 8 (eight) hours as needed for nausea or vomiting., Starting 03/11/2015, Until Discontinued, Print    riTUXimab in sodium chloride 0.9 % 250 mL Infusion 2 weeks apart: May repeat in 6 months. Follow up with primary care doctor prior to initiation of rituximab for infectious clearance, No Print    vitamin B-12 (CYANOCOBALAMIN) 1000 MCG tablet Take 1 tablet (1,000 mcg total) by mouth daily., Starting 03/11/2015, Until Discontinued, No Print      CONTINUE these medications which have NOT CHANGED   Details  albuterol (PROVENTIL HFA;VENTOLIN HFA) 108 (90 BASE) MCG/ACT inhaler Inhale 2 puffs into the lungs every 6 (six) hours as needed for wheezing or shortness of breath., Starting 09/17/2014, Until Discontinued,  Normal    ALPRAZolam (XANAX) 1 MG tablet Take 1 tablet (1 mg total) by mouth 2 (two) times daily as needed for anxiety., Starting 09/17/2014, Until Discontinued, Print    amLODipine (NORVASC) 10 MG tablet TAKE ONE TABLET BY MOUTH ONCE DAILY, Normal    dabigatran (PRADAXA) 150 MG CAPS capsule Take 1 capsule (150 mg total) by mouth 2 (two) times daily., Starting 03/05/2015, Until Discontinued, Normal    DUREZOL 0.05 % EMUL Place 1 drop into the left eye 3 (three) times daily., Starting 02/22/2015, Until Discontinued, Historical Med    flecainide (TAMBOCOR) 50 MG tablet Take 1 tablet (50 mg total) by mouth 2 (two) times daily., Starting 06/07/2014, Until Discontinued, Normal    HYDROcodone-acetaminophen (NORCO/VICODIN) 5-325 MG per tablet Take 1 tablet by mouth every 4 (four) hours as needed for moderate pain., Starting 12/18/2014, Until Discontinued, Print    levothyroxine (LEVOTHROID) 25 MCG tablet Take 1 tablet (25 mcg total) by mouth daily before breakfast., Starting 02/14/2015, Until Discontinued, Normal    lisinopril (PRINIVIL,ZESTRIL) 40 MG tablet TAKE ONE TABLET BY MOUTH ONCE DAILY, Normal    !! metoprolol succinate (TOPROL-XL) 100 MG 24 hr tablet Take 100 mg by mouth daily. Take with or immediately following a meal., Until Discontinued, Historical Med    !! metoprolol  succinate (TOPROL-XL) 50 MG 24 hr tablet TAKE ONE TABLET BY MOUTH ONCE DAILY -TAKE  WITH  OR  IMMEDIATELY  FOLLOWING  A  MEAL, Normal    PROLENSA 0.07 % SOLN Place 1 drop into the left eye daily., Starting 02/22/2015, Until Discontinued, Historical Med    tobramycin (TOBREX) 0.3 % ophthalmic solution Place 1 drop into the left eye 4 (four) times daily., Starting 02/22/2015, Until Discontinued, Historical Med    pravastatin (PRAVACHOL) 80 MG tablet Take 1 tablet (80 mg total) by mouth daily., Starting 02/23/2014, Until Discontinued, Normal     !! - Potential duplicate medications found. Please discuss with provider.    STOP  taking these medications     omeprazole (PRILOSEC) 20 MG capsule        Allergies  Allergen Reactions  . Penicillins Other (See Comments)    Caused patient to pass out.Can take cephalosporins  . Levaquin [Levofloxacin] Nausea Only  . Sulfa Antibiotics Other (See Comments)    Unknown  . Zithromax [Azithromycin] Nausea Only and Rash   Follow-up Information    Follow up with [facility].   Contact information:   [address] [place] [address] [phone]        The results of significant diagnostics from this hospitalization (including imaging, microbiology, ancillary and laboratory) are listed below for reference.    Significant Diagnostic Studies: Dg Chest 2 View  03/07/2015   CLINICAL DATA:  Intermittent fatigue.  Hypertension.  Nausea.  EXAM: CHEST  2 VIEW  COMPARISON:  01/07/2015; 07/13/2013  FINDINGS: Grossly unchanged borderline enlarged cardiac silhouette and mediastinal contours with atherosclerotic plaque within the thoracic aorta. Stable position of support apparatus. The lungs remain hyperexpanded. Left basilar heterogeneous opacities are unchanged and favored to represent atelectasis or scar. No new focal airspace opacities. No pleural effusion pneumothorax. No evidence of edema. Stigmata of DISH within the caudal aspect of the thoracic spine.  IMPRESSION: Similar findings of borderline cardiomegaly and lung hyperexpansion without acute cardiopulmonary disease.   Electronically Signed   By: Sandi Mariscal M.D.   On: 03/07/2015 19:21   Ct Head Wo Contrast  03/10/2015   CLINICAL DATA:  81 year old hypertensive female with intractable nausea and vomiting for the past year. Dizziness today. Initial encounter.  EXAM: CT HEAD WITHOUT CONTRAST  TECHNIQUE: Contiguous axial images were obtained from the base of the skull through the vertex without intravenous contrast.  COMPARISON:  None.  FINDINGS: Exam is motion degraded.  No intracranial hemorrhage.   Small vessel disease type changes without CT evidence of large acute infarct.  Global atrophy without hydrocephalus.  No intracranial mass lesion noted on this unenhanced exam.  Vascular calcifications.  Mastoid air cells, middle ear cavities and visualized paranasal sinuses are clear.  IMPRESSION: No intracranial hemorrhage.  Small vessel disease type changes without CT evidence of large acute infarct.   Electronically Signed   By: Genia Del M.D.   On: 03/10/2015 14:37   Nm Gastric Emptying  03/09/2015   CLINICAL DATA:  Early satiety, bloating.  EXAM: NUCLEAR MEDICINE GASTRIC EMPTYING SCAN  TECHNIQUE: After oral ingestion of radiolabeled meal, sequential abdominal images were obtained for 120 minutes. Residual percentage of activity remaining within the stomach was calculated at 60 and 120 minutes.  RADIOPHARMACEUTICALS:  2.0 mCi Technetium-28m labeled sulfur colloid orally  COMPARISON:  None.  FINDINGS: Expected location of the stomach in the left upper quadrant. Ingested meal empties the stomach gradually over the course of the study  with 54%% retention at 60 min and 7%% retention at 120 min (normal retention less than 30% at a 120 min).  IMPRESSION: Normal gastric emptying study.   Electronically Signed   By: Suzy Bouchard M.D.   On: 03/09/2015 10:48   US Abdomen Complete  03/08/2015   CLINICAL DATA:  Cholecystitis, nausea, vomiting, history hypertension, hyperlipidemia, renal insufficiency  EXAM: ULTRASOUND ABDOMEN COMPLETE  COMPARISON:  CT abdomen and pelvis 03/07/2015  FINDINGS: Gallbladder: Well distended without stones or wall thickening. No pericholecystic fluid or sonographic Murphy sign.  Common bile duct: Diameter: 9 mm diameter, minimally prominent for age.  Liver: Normal appearance  IVC: Normal appearance  Pancreas: Normal appearance  Spleen: Poorly visualized due to high subcostal position, grossly normal size 5.3 cm length.  Right Kidney: Length: 12.2 cm. Normal morphology without mass or  hydronephrosis.  Left Kidney: Length: 10.0 cm. Normal morphology without mass or hydronephrosis.  Abdominal aorta: Obscured proximally by bowel gas. Visualized portions normal caliber.  Other findings: No free-fluid  IMPRESSION: No evidence of gallstones or sonographic features of acute cholecystitis.  Prominent CBD 9 mm diameter, recommend correlation with LFTs.   Electronically Signed   By: Lavonia Dana M.D.   On: 03/08/2015 09:12   Ct Abdomen Pelvis W Contrast  03/07/2015   CLINICAL DATA:  Hypertension. Fatigue and nausea. Renal insufficiency.  EXAM: CT ABDOMEN AND PELVIS WITH CONTRAST  TECHNIQUE: Multidetector CT imaging of the abdomen and pelvis was performed using the standard protocol following bolus administration of intravenous contrast.  CONTRAST:  58mL OMNIPAQUE IOHEXOL 300 MG/ML SOLN, 59mL OMNIPAQUE IOHEXOL 300 MG/ML SOLN  COMPARISON:  Multiple exams, including 05/17/2014  FINDINGS: Lower chest: Scattered small pulmonary nodules in the left lower lobe and lingula, somewhat clustered peripherally, measuring up to 4 mm in diameter.  Pacemaker leads observed.  Hepatobiliary: Borderline gallbladder wall thickening. Poor definition of the common bile duct which measures up to 9 mm diameter. This appears to taper in the vicinity of the ampulla.  Pancreas: Unremarkable  Spleen: Unremarkable  Adrenals/Urinary Tract: Unremarkable  Stomach/Bowel: Considerable sigmoid colon diverticulosis. There is a less degree of descending colon diverticulosis. Scattered air-fluid levels in nondilated loops of jejunum with borderline fold thickening.  Vascular/Lymphatic: Aortoiliac atherosclerotic vascular disease.  Reproductive: Uterus absent. 1.6 by 1.3 cm left adnexal cyst adjacent to the ovary. This has simple fluid density characteristics. Right ovary poorly seen.  Other: Trace free pelvic fluid.  Musculoskeletal: Degenerative disc disease with loss of disc height at L4-5. There is moderate right foraminal stenosis at L4-5  due to right foraminal disc protrusion, intervertebral spurring, and facet spurring. Chondrocalcinosis in the pubic symphysis and in the acetabular labrum.  IMPRESSION: 1. Clustered small pulmonary nodules in the left lower lobe and lingula, suspicious for atypical infectious process such as atypical infectious bronchiolitis or fungal disease. 2. Borderline gallbladder wall thickening. Reviewing the patient's labs, this may well be due to the patient' s hypoalbuminemia and hypoproteinemia, although inflammation can also cause gallbladder wall thickening. 3. Poor definition of the common bile duct which is mildly dilated. This is of uncertain significance. The patient's bilirubin is not elevated thus a biliary obstructive process is unlikely. Without liver enzyme elevation I doubt that the patient has cholangitis. This mild extrahepatic biliary dilatation might simply be incidental. 4. Sigmoid colon and descending colon diverticulosis. There is a small amount of free pelvic fluid but no mesenteric edema to suggest current active diverticulitis. 5. Scattered air-fluid levels in nondilated loops of jejunum. There  is borderline fold thickening in these loops. The possibility of proximal enteritis is raised. 6.  Aortoiliac atherosclerotic vascular disease. 7. Average size 1.5 cm left adnexal cyst appears simple and does not require further workup. 8. Degenerative disc disease and spondylosis causing moderate right foraminal stenosis at L4-5. 9. Chondrocalcinosis in the pelvis, suggesting CPPD arthropathy.   Electronically Signed   By: Van Clines M.D.   On: 03/07/2015 21:24    Microbiology: Recent Results (from the past 240 hour(s))  Urine culture     Status: None   Collection Time: 03/07/15  9:58 PM  Result Value Ref Range Status   Specimen Description URINE, CLEAN CATCH  Final   Special Requests Normal  Final   Colony Count   Final    3,000 COLONIES/ML Performed at [facility]    Culture    Final    INSIGNIFICANT GROWTH Performed at [facility]    Report Status 03/09/2015 FINAL  Final  Clostridium Difficile by PCR     Status: Abnormal   Collection Time: 03/10/15 10:00 PM  Result Value Ref Range Status   C difficile by pcr POSITIVE (A) NEGATIVE Final    Comment: RESULT CALLED TO, READ BACK BY AND VERIFIED WITH: THOMAS,K AT 2313 ON 03/10/15 BY MOSLEY,J      Labs: Basic Metabolic Panel:  Recent Labs Lab 03/07/15 1832 03/08/15 0718 03/09/15 0549 03/10/15 0600  NA 141 142 141 141  K 3.6 3.4* 4.1 4.0  CL 108 112* 111 112*  CO2 23 23 24 24   GLUCOSE 120* 98 96 109*  BUN 16 11 11 17   CREATININE 1.33* 1.17* 1.21* 1.28*  CALCIUM 8.6* 8.3* 8.8* 8.3*   Liver Function Tests:  Recent Labs Lab 03/07/15 1832 03/08/15 0718  AST 26 21  ALT 16 13*  ALKPHOS 67 55  BILITOT 0.4 0.7  PROT 5.7* 4.8*  ALBUMIN 3.1* 2.5*   No results for input(s): LIPASE, AMYLASE in the last 168 hours. No results for input(s): AMMONIA in the last 168 hours. CBC:  Recent Labs Lab 03/07/15 1832 03/08/15 0718  WBC 7.8 6.9  NEUTROABS 5.4  --   HGB 12.6 11.9*  HCT 36.1 34.2*  MCV 91.6 92.2  PLT 246 212   Cardiac Enzymes:  Recent Labs Lab 03/07/15 1832  TROPONINI <0.03   BNP: BNP (last 3 results) No results for input(s): BNP in the last 8760 hours.  ProBNP (last 3 results) No results for input(s): PROBNP in the last 8760 hours.  CBG:  Recent Labs Lab 03/08/15 2230  GLUCAP 126*       Signed:  MEMON,JEHANZEB  [facility] 03/11/2015, 6:44 PM

## 2015-03-11 NOTE — Clinical Social Work Note (Signed)
CSW provided list of outpatient mental health providers to pt at MD request. She indicates she may be interested in the future, but not yet.   Benay Pike, [place]

## 2015-03-11 NOTE — Care Management Note (Signed)
Case Management Note  Patient Details  Name: Kelsey Sandoval MRN: [id number] Date of Birth: 1932-11-02  Expected Discharge Date:                  Expected Discharge Plan:  Home/Self Care  In-House Referral:  NA  Discharge planning Services  CM Consult  Post Acute Care Choice:  Home Health Choice offered to:  Patient  DME Arranged:    DME Agency:     HH Arranged:  RN, PT [place] Agency:  [facility] [facility]  Status of Service:  Completed, signed off  Medicare Important Message Given:  Yes Date Medicare IM Given:  03/08/15 Medicare IM give by:  Jolene Provost, RN, MSN, CM Date Additional Medicare IM Given:  03/11/15 Additional Medicare Important Message give by:  Jolene Provost, RN, MSN, CM   If discussed at Long Length of Stay Meetings, dates discussed:    Additional Comments: Pt discharging home today. Pt previously refused [place] services but is now agreeable. Pt has chosen [facility] for RN/PT services. Romualdo Bolk, of [facility], aware of referral and will obtain info from chart. Pt informed [facility] has 48 hours to make first visit. Pt has cane and walker for PRN use at home. No further CM needs.  Sherald Barge, RN 03/11/2015, 1:56 PM

## 2015-03-11 NOTE — Progress Notes (Signed)
PHARMACIST - PHYSICIAN COMMUNICATION DR:   [facility] CONCERNING: Antibiotic IV to Oral Route Change Policy  RECOMMENDATION: This patient is receiving Levaquin by the intravenous route.  Based on criteria approved by the Pharmacy and Therapeutics Committee, the antibiotic(s) is/are being converted to the equivalent oral dose form(s).   DESCRIPTION: These criteria include:  Patient being treated for a respiratory tract infection, urinary tract infection, cellulitis or clostridium difficile associated diarrhea if on metronidazole  The patient is not neutropenic and does not exhibit a GI malabsorption state  The patient is eating (either orally or via tube) and/or has been taking other orally administered medications for a least 24 hours  The patient is improving clinically and has a Tmax < 100.5  If you have questions about this conversion, please contact the Pharmacy Department  [x]   [phone] )  [facility] []   [phone] )  [facility] []   [phone] )  [facility] []   [phone] )  [facility] []   [phone] )  [place], [facility]  03/11/2015 11:01 AM

## 2015-03-12 ENCOUNTER — Encounter: Payer: Self-pay | Admitting: Family Medicine

## 2015-03-12 DIAGNOSIS — F329 Major depressive disorder, single episode, unspecified: Secondary | ICD-10-CM | POA: Diagnosis not present

## 2015-03-12 DIAGNOSIS — A047 Enterocolitis due to Clostridium difficile: Secondary | ICD-10-CM | POA: Diagnosis not present

## 2015-03-12 DIAGNOSIS — E440 Moderate protein-calorie malnutrition: Secondary | ICD-10-CM | POA: Diagnosis not present

## 2015-03-12 DIAGNOSIS — I480 Paroxysmal atrial fibrillation: Secondary | ICD-10-CM | POA: Diagnosis not present

## 2015-03-12 DIAGNOSIS — E785 Hyperlipidemia, unspecified: Secondary | ICD-10-CM | POA: Diagnosis not present

## 2015-03-12 DIAGNOSIS — I10 Essential (primary) hypertension: Secondary | ICD-10-CM | POA: Diagnosis not present

## 2015-03-12 DIAGNOSIS — Z950 Presence of cardiac pacemaker: Secondary | ICD-10-CM | POA: Diagnosis not present

## 2015-03-12 DIAGNOSIS — J189 Pneumonia, unspecified organism: Secondary | ICD-10-CM | POA: Diagnosis not present

## 2015-03-14 DIAGNOSIS — F329 Major depressive disorder, single episode, unspecified: Secondary | ICD-10-CM | POA: Diagnosis not present

## 2015-03-14 DIAGNOSIS — E440 Moderate protein-calorie malnutrition: Secondary | ICD-10-CM | POA: Diagnosis not present

## 2015-03-14 DIAGNOSIS — I10 Essential (primary) hypertension: Secondary | ICD-10-CM | POA: Diagnosis not present

## 2015-03-14 DIAGNOSIS — A047 Enterocolitis due to Clostridium difficile: Secondary | ICD-10-CM | POA: Diagnosis not present

## 2015-03-14 DIAGNOSIS — I480 Paroxysmal atrial fibrillation: Secondary | ICD-10-CM | POA: Diagnosis not present

## 2015-03-14 DIAGNOSIS — J189 Pneumonia, unspecified organism: Secondary | ICD-10-CM | POA: Diagnosis not present

## 2015-03-18 ENCOUNTER — Telehealth: Payer: Self-pay | Admitting: Cardiology

## 2015-03-18 ENCOUNTER — Encounter: Payer: Medicare Other | Admitting: *Deleted

## 2015-03-18 ENCOUNTER — Encounter: Payer: Self-pay | Admitting: Nurse Practitioner

## 2015-03-18 ENCOUNTER — Ambulatory Visit (INDEPENDENT_AMBULATORY_CARE_PROVIDER_SITE_OTHER): Payer: Medicare Other | Admitting: Nurse Practitioner

## 2015-03-18 VITALS — BP 150/88 | Temp 97.9°F | Ht 69.0 in | Wt 151.5 lb

## 2015-03-18 DIAGNOSIS — N059 Unspecified nephritic syndrome with unspecified morphologic changes: Secondary | ICD-10-CM | POA: Diagnosis not present

## 2015-03-18 DIAGNOSIS — R110 Nausea: Secondary | ICD-10-CM

## 2015-03-18 DIAGNOSIS — F329 Major depressive disorder, single episode, unspecified: Secondary | ICD-10-CM | POA: Diagnosis not present

## 2015-03-18 DIAGNOSIS — E440 Moderate protein-calorie malnutrition: Secondary | ICD-10-CM | POA: Diagnosis not present

## 2015-03-18 DIAGNOSIS — E039 Hypothyroidism, unspecified: Secondary | ICD-10-CM

## 2015-03-18 DIAGNOSIS — R5383 Other fatigue: Secondary | ICD-10-CM | POA: Diagnosis not present

## 2015-03-18 DIAGNOSIS — F32A Depression, unspecified: Secondary | ICD-10-CM

## 2015-03-18 MED ORDER — DRONABINOL 2.5 MG PO CAPS: 2.5000 mg | ORAL_CAPSULE | Freq: Two times a day (BID) | ORAL | Status: DC

## 2015-03-18 NOTE — Telephone Encounter (Signed)
LMOVM reminding pt to send remote transmission.   

## 2015-03-19 ENCOUNTER — Encounter: Payer: Self-pay | Admitting: Nurse Practitioner

## 2015-03-19 ENCOUNTER — Encounter: Payer: Self-pay | Admitting: Cardiology

## 2015-03-19 ENCOUNTER — Telehealth (INDEPENDENT_AMBULATORY_CARE_PROVIDER_SITE_OTHER): Payer: Self-pay | Admitting: *Deleted

## 2015-03-19 DIAGNOSIS — E440 Moderate protein-calorie malnutrition: Secondary | ICD-10-CM | POA: Diagnosis not present

## 2015-03-19 DIAGNOSIS — I10 Essential (primary) hypertension: Secondary | ICD-10-CM | POA: Diagnosis not present

## 2015-03-19 DIAGNOSIS — I480 Paroxysmal atrial fibrillation: Secondary | ICD-10-CM | POA: Diagnosis not present

## 2015-03-19 DIAGNOSIS — J189 Pneumonia, unspecified organism: Secondary | ICD-10-CM | POA: Diagnosis not present

## 2015-03-19 DIAGNOSIS — A047 Enterocolitis due to Clostridium difficile: Secondary | ICD-10-CM | POA: Diagnosis not present

## 2015-03-19 DIAGNOSIS — F329 Major depressive disorder, single episode, unspecified: Secondary | ICD-10-CM | POA: Diagnosis not present

## 2015-03-19 NOTE — Telephone Encounter (Signed)
Not sure why patient didn't speak with Terri. Will you see if you can help her or Dr. Laural Golden when he returns.

## 2015-03-19 NOTE — Telephone Encounter (Signed)
Patient states she has already talked with someone

## 2015-03-19 NOTE — Telephone Encounter (Signed)
I have reviewed the ED discharge 03-11-15 and below you will see Dr.Rehman's recommendations. Continue ondansetron by mouth every morning. Continue metronidazole for 10 days. Florastor 1 capsule by mouth daily for 1 month. Continue Marinol at current dose. Office visit in 3-4 weeks. Office will call patient.  I will continue to review information to return call to the patient, and this will be reviewed with Dr.Rehman upon his return 03-20-15.

## 2015-03-19 NOTE — Telephone Encounter (Signed)
Kelsey Sandoval was seen in the hospital by Dr. Laural Golden and a lot of test were done. She can not remember and would like to get those results. She also is wanting to know when her next apt should be. The return phone number is [phone].

## 2015-03-19 NOTE — Progress Notes (Signed)
Subjective:  Presents with her son and daughter to discuss her recent hospitalization. All of her medications were reviewed and clarified. Her daughter marked all of her bottles of current meds. Was restarted on Celexa right after discharge, family noticed a great improvement in all of her symptoms, she then stopped med couple days ago and has since regressed. Had multiple workups done during hospitalization see chart. Was given Marinol by GI specialist which greatly helped her appetite, told her family she felt the best she has felt in a while while taking it. Was not discharged on medication. Nausea has resolved. Continues to experience some fatigue and depression symptoms. Neuropathy greatly improved with gabapentin.  Objective:   BP 150/88 mmHg  Temp(Src) 97.9 F (36.6 C) (Oral)  Ht 5\' 9"  (1.753 m)  Wt 151 lb 8 oz (68.72 kg)  BMI 22.36 kg/m2 NAD. Alert, oriented. Thoughts logical coherent and relevant. Dressed appropriately. Depressed affect. Lungs clear. Heart regular rate rhythm. Abdomen soft nondistended nontender.  Assessment:  Problem List Items Addressed This Visit      Endocrine   Hypothyroidism     Other   Depression   Malnutrition of moderate degree    Other Visit Diagnoses    Nausea    -  Primary    Other fatigue        Nephritis          Plan:  Meds ordered this encounter  Medications  . dronabinol (MARINOL) 2.5 MG capsule    Sig: Take 1 capsule (2.5 mg total) by mouth 2 (two) times daily before lunch and supper.    Dispense:  60 capsule    Refill:  2    Order Specific Question:  Supervising Provider    Answer:  Mikey Kirschner [[id number]]   Hold on omeprazole. Take Pepcid as prescribed at discharge. Restart Marinol as directed for appetite. Follow-up with Dr. Laural Golden as planned. Our office will check to see if she can receive her infusion for nephrotic syndrome here is local hospital instead of [facility]; her daughter states that it "tears her nerves up" to have to go  to [facility]. Resume routine follow-up with Dr. Nicki Reaper.

## 2015-03-21 DIAGNOSIS — F329 Major depressive disorder, single episode, unspecified: Secondary | ICD-10-CM | POA: Diagnosis not present

## 2015-03-21 DIAGNOSIS — I10 Essential (primary) hypertension: Secondary | ICD-10-CM | POA: Diagnosis not present

## 2015-03-21 DIAGNOSIS — J189 Pneumonia, unspecified organism: Secondary | ICD-10-CM | POA: Diagnosis not present

## 2015-03-21 DIAGNOSIS — I480 Paroxysmal atrial fibrillation: Secondary | ICD-10-CM | POA: Diagnosis not present

## 2015-03-21 DIAGNOSIS — A047 Enterocolitis due to Clostridium difficile: Secondary | ICD-10-CM | POA: Diagnosis not present

## 2015-03-21 DIAGNOSIS — E440 Moderate protein-calorie malnutrition: Secondary | ICD-10-CM | POA: Diagnosis not present

## 2015-03-21 NOTE — Telephone Encounter (Signed)
Apt has been scheduled for 04/16/15 with Deberah Castle, NP.

## 2015-03-25 DIAGNOSIS — F329 Major depressive disorder, single episode, unspecified: Secondary | ICD-10-CM | POA: Diagnosis not present

## 2015-03-25 DIAGNOSIS — Z961 Presence of intraocular lens: Secondary | ICD-10-CM | POA: Diagnosis not present

## 2015-03-25 DIAGNOSIS — A047 Enterocolitis due to Clostridium difficile: Secondary | ICD-10-CM | POA: Diagnosis not present

## 2015-03-25 DIAGNOSIS — I10 Essential (primary) hypertension: Secondary | ICD-10-CM | POA: Diagnosis not present

## 2015-03-25 DIAGNOSIS — J189 Pneumonia, unspecified organism: Secondary | ICD-10-CM | POA: Diagnosis not present

## 2015-03-25 DIAGNOSIS — I480 Paroxysmal atrial fibrillation: Secondary | ICD-10-CM | POA: Diagnosis not present

## 2015-03-25 DIAGNOSIS — E440 Moderate protein-calorie malnutrition: Secondary | ICD-10-CM | POA: Diagnosis not present

## 2015-03-25 DIAGNOSIS — H25811 Combined forms of age-related cataract, right eye: Secondary | ICD-10-CM | POA: Diagnosis not present

## 2015-03-26 ENCOUNTER — Encounter: Payer: Self-pay | Admitting: Family Medicine

## 2015-03-26 ENCOUNTER — Ambulatory Visit (INDEPENDENT_AMBULATORY_CARE_PROVIDER_SITE_OTHER): Payer: Medicare Other | Admitting: Family Medicine

## 2015-03-26 VITALS — BP 124/80 | Ht 69.0 in | Wt 148.0 lb

## 2015-03-26 DIAGNOSIS — Z5189 Encounter for other specified aftercare: Secondary | ICD-10-CM

## 2015-03-26 DIAGNOSIS — J189 Pneumonia, unspecified organism: Secondary | ICD-10-CM | POA: Diagnosis not present

## 2015-03-26 DIAGNOSIS — R911 Solitary pulmonary nodule: Secondary | ICD-10-CM

## 2015-03-26 DIAGNOSIS — G894 Chronic pain syndrome: Secondary | ICD-10-CM | POA: Diagnosis not present

## 2015-03-26 DIAGNOSIS — M170 Bilateral primary osteoarthritis of knee: Secondary | ICD-10-CM

## 2015-03-26 DIAGNOSIS — Z79891 Long term (current) use of opiate analgesic: Secondary | ICD-10-CM | POA: Diagnosis not present

## 2015-03-26 DIAGNOSIS — G43A0 Cyclical vomiting, not intractable: Secondary | ICD-10-CM

## 2015-03-26 DIAGNOSIS — R52 Pain, unspecified: Secondary | ICD-10-CM

## 2015-03-26 DIAGNOSIS — F329 Major depressive disorder, single episode, unspecified: Secondary | ICD-10-CM | POA: Diagnosis not present

## 2015-03-26 DIAGNOSIS — F32A Depression, unspecified: Secondary | ICD-10-CM

## 2015-03-26 DIAGNOSIS — R1115 Cyclical vomiting syndrome unrelated to migraine: Secondary | ICD-10-CM

## 2015-03-26 MED ORDER — HYDROCODONE-ACETAMINOPHEN 5-325 MG PO TABS: 1.0000 | ORAL_TABLET | ORAL | Status: DC | PRN

## 2015-03-26 MED ORDER — MIRTAZAPINE 7.5 MG PO TABS: 7.5000 mg | ORAL_TABLET | Freq: Every day | ORAL | Status: DC

## 2015-03-26 NOTE — Patient Instructions (Addendum)
As part of your visit today we have covered your chronic pain. You have been given prescription(s) for pain medicines.The DEA and [place] require that any patient on pain medications must be seen every 3 months. You are expected to come in for a office visit before further pain medications are issued.  Since we are managing your pain do not get pain scripts from other doctors. We check the prescription registry regularly. If you are receiving pain medicines from another source we will STOP prescribing pain medicines.   We will not refill medications or early nor will we give an extended month supply at the end of these prescriptions.It is your responsibility to keep up with medications. They will not be replaced.  It is your responsibility to schedule an office visit in 3-4 months to be seen before you are out of your medication. Do not call our office to request early refills or additional refills. Do not wait till the last moment to schedule the follow up visit. We highly recommend you schedule this now for 3 months.  We believe that most patients take their meds as prescribed but drug misuse and diversion is a serious problem in the [place]. Our office does standard measures to insure proper care to all. All patients are subject to random urine drug screens/ saliva tests and random pill counts. Also all patients drug prescription records are reviewed on a regular basis in accordance with [facility] medical board policies.  Remember, do not use alcohol or illegal drugs with your pain medications.    We are required by law to adhere to strict regulations. Failure on our part to follow these regulations could jeopardize our prescription license which in turn would cause [place] not to be able to care for you.Thank you for your understanding and following these policies.   STOP CELEXA AND START REMERON.  CT CHEST SCHEDULED AT [facility] ON AUGUST 22ND 9:30. REGISTER 9:15. NO PREP FOR THE TEST

## 2015-03-26 NOTE — Progress Notes (Signed)
Subjective:    Patient ID: Kelsey Sandoval, female    DOB: 1933/08/13, 81 y.o.   MRN: [id number] Pt arrives today with sister Inez Catalina.  HPI This patient was seen today for chronic pain Takes pain med for sciatica pain, also having knee pain, and hip pain.  The medication list was reviewed and updated.   -Compliance with pain medication: yes  The patient was advised the importance of maintaining medication and not using illegal substances with these.  Refills needed: yes  The patient was educated that we can provide 3 monthly scripts for their medication, it is their responsibility to follow the instructions.  Side effects or complications from medications: none  Patient is aware that pain medications are meant to minimize the severity of the pain to allow their pain levels to improve to allow for better function. They are aware of that pain medications cannot totally remove their pain.  Due for UDT ( at least once per year) : urine drug screen today.   Pt is hurting her knees. But daughter and home health wants to change order to do whole body strengtening so it will not be so bad on her knees.   Needs to get order to do infusion at [facility]. Taking rituximab infusion for her kidneys.  [facility]. Needs to do infusion at [place].      Review of Systems  Constitutional: Negative for activity change, appetite change and fatigue.  HENT: Negative for congestion.   Respiratory: Negative for cough.   Cardiovascular: Negative for chest pain.  Gastrointestinal: Negative for abdominal pain.  Endocrine: Negative for polydipsia and polyphagia.  Neurological: Negative for weakness.  Psychiatric/Behavioral: Negative for confusion.       Objective:   Physical Exam  Constitutional: She appears well-nourished. No distress.  Cardiovascular: Normal rate, regular rhythm and normal heart sounds.   No murmur  heard. Pulmonary/Chest: Effort normal and breath sounds normal. No respiratory distress.  Musculoskeletal: She exhibits no edema.  Lymphadenopathy:    She has no cervical adenopathy.  Neurological: She is alert. She exhibits normal muscle tone.  Psychiatric: Her behavior is normal.  Vitals reviewed.   Greater than 25 minutes spent with this patient over multiple different issues      Assessment & Plan:  1. Pain management Urine drug screen taken. Chronic pain management agreement was signed and given to the patient. Patient understands what was stated for - ToxASSURE Select 13 (MW), Urine  2. Pulmonary nodule Patient and CT scan in the hospital which showed multiple nodules in the lung is recommend repeat scan in 8 weeks. - CT Chest W Contrast  3. CAP (community acquired pneumonia) Pneumonia seems to be resolved. Follow-up scan recommended see above  4. Non-intractable cyclical vomiting with nausea Patient still with some nausea will be following up with gastroenterology could have some component of depression with it  5. Depression Patient under a lot of stress, her boyfriend is dying, family stressors as well and personal health issues causing her to feel stressed and anxious depressed. Currently on Celexa this medication could interact with left denied inch trigger sheet to arrhythmia issues so therefore we will try Remeron 7.5 mg daily at bedtime in hopes it'll help her sleep better help with her depression and anxiety plus also help her gain weight.  6. Primary osteoarthritis of both knees Pain medication hydrocodone 5 mg 4 times a day  on a daily basis patient does not abuse this.  7. Chronic pain syndrome Urine drug screen today.

## 2015-03-27 ENCOUNTER — Telehealth: Payer: Self-pay | Admitting: Cardiovascular Disease

## 2015-03-27 ENCOUNTER — Ambulatory Visit (INDEPENDENT_AMBULATORY_CARE_PROVIDER_SITE_OTHER): Payer: Medicare Other | Admitting: *Deleted

## 2015-03-27 DIAGNOSIS — I495 Sick sinus syndrome: Secondary | ICD-10-CM

## 2015-03-27 DIAGNOSIS — J189 Pneumonia, unspecified organism: Secondary | ICD-10-CM | POA: Diagnosis not present

## 2015-03-27 DIAGNOSIS — I480 Paroxysmal atrial fibrillation: Secondary | ICD-10-CM | POA: Diagnosis not present

## 2015-03-27 DIAGNOSIS — E440 Moderate protein-calorie malnutrition: Secondary | ICD-10-CM | POA: Diagnosis not present

## 2015-03-27 DIAGNOSIS — F329 Major depressive disorder, single episode, unspecified: Secondary | ICD-10-CM | POA: Diagnosis not present

## 2015-03-27 DIAGNOSIS — I10 Essential (primary) hypertension: Secondary | ICD-10-CM | POA: Diagnosis not present

## 2015-03-27 DIAGNOSIS — A047 Enterocolitis due to Clostridium difficile: Secondary | ICD-10-CM | POA: Diagnosis not present

## 2015-03-27 NOTE — Telephone Encounter (Signed)
New Message   4. Are you calling to see if we received your device transmission? Yes. Pt states that she received a letter indicating her signal was not received. Req a call back to discuss why it wasn't received. Please call

## 2015-03-27 NOTE — Telephone Encounter (Signed)
Spoke w/ pt and explained that transmission from 6-13 was not received. Informed pt that she had to let home monitor finish and she would know when the transmission was completed when the target symbol lit up. Pt verbalized understanding.

## 2015-03-28 DIAGNOSIS — I10 Essential (primary) hypertension: Secondary | ICD-10-CM | POA: Diagnosis not present

## 2015-03-28 DIAGNOSIS — I480 Paroxysmal atrial fibrillation: Secondary | ICD-10-CM | POA: Diagnosis not present

## 2015-03-28 DIAGNOSIS — J189 Pneumonia, unspecified organism: Secondary | ICD-10-CM | POA: Diagnosis not present

## 2015-03-28 DIAGNOSIS — A047 Enterocolitis due to Clostridium difficile: Secondary | ICD-10-CM | POA: Diagnosis not present

## 2015-03-28 DIAGNOSIS — E440 Moderate protein-calorie malnutrition: Secondary | ICD-10-CM | POA: Diagnosis not present

## 2015-03-28 DIAGNOSIS — F329 Major depressive disorder, single episode, unspecified: Secondary | ICD-10-CM | POA: Diagnosis not present

## 2015-03-28 NOTE — Progress Notes (Signed)
Remote pacemaker transmission.   

## 2015-03-30 LAB — TOXASSURE SELECT 13 (MW), URINE: PDF: 0

## 2015-03-31 LAB — CUP PACEART REMOTE DEVICE CHECK
Battery Impedance: 156 Ohm
Battery Remaining Longevity: 147 mo
Battery Voltage: 2.79 V
Brady Statistic AP VS Percent: 33 %
Brady Statistic AS VP Percent: 0 %
Date Time Interrogation Session: 20160622161810
Lead Channel Impedance Value: 453 Ohm
Lead Channel Pacing Threshold Pulse Width: 0.4 ms
Lead Channel Sensing Intrinsic Amplitude: 1.4 mV
MDC IDC MSMT LEADCHNL RA IMPEDANCE VALUE: 421 Ohm
MDC IDC MSMT LEADCHNL RA PACING THRESHOLD AMPLITUDE: 1.125 V
MDC IDC MSMT LEADCHNL RA PACING THRESHOLD PULSEWIDTH: 0.4 ms
MDC IDC MSMT LEADCHNL RV PACING THRESHOLD AMPLITUDE: 0.875 V
MDC IDC MSMT LEADCHNL RV SENSING INTR AMPL: 11.2 mV
MDC IDC SET LEADCHNL RA PACING AMPLITUDE: 2.25 V
MDC IDC SET LEADCHNL RV PACING AMPLITUDE: 2 V
MDC IDC SET LEADCHNL RV PACING PULSEWIDTH: 0.4 ms
MDC IDC SET LEADCHNL RV SENSING SENSITIVITY: 4 mV
MDC IDC STAT BRADY AP VP PERCENT: 0 %
MDC IDC STAT BRADY AS VS PERCENT: 66 %

## 2015-04-01 DIAGNOSIS — E038 Other specified hypothyroidism: Secondary | ICD-10-CM | POA: Diagnosis not present

## 2015-04-02 DIAGNOSIS — N022 Recurrent and persistent hematuria with diffuse membranous glomerulonephritis: Secondary | ICD-10-CM | POA: Diagnosis not present

## 2015-04-02 LAB — TSH: TSH: 10.45 u[IU]/mL — ABNORMAL HIGH (ref 0.450–4.500)

## 2015-04-03 DIAGNOSIS — A047 Enterocolitis due to Clostridium difficile: Secondary | ICD-10-CM | POA: Diagnosis not present

## 2015-04-03 DIAGNOSIS — F329 Major depressive disorder, single episode, unspecified: Secondary | ICD-10-CM | POA: Diagnosis not present

## 2015-04-03 DIAGNOSIS — I10 Essential (primary) hypertension: Secondary | ICD-10-CM | POA: Diagnosis not present

## 2015-04-03 DIAGNOSIS — I480 Paroxysmal atrial fibrillation: Secondary | ICD-10-CM | POA: Diagnosis not present

## 2015-04-03 DIAGNOSIS — J189 Pneumonia, unspecified organism: Secondary | ICD-10-CM | POA: Diagnosis not present

## 2015-04-03 DIAGNOSIS — E440 Moderate protein-calorie malnutrition: Secondary | ICD-10-CM | POA: Diagnosis not present

## 2015-04-04 ENCOUNTER — Encounter: Payer: Self-pay | Admitting: Cardiology

## 2015-04-04 ENCOUNTER — Encounter: Payer: Self-pay | Admitting: Nurse Practitioner

## 2015-04-04 ENCOUNTER — Ambulatory Visit (INDEPENDENT_AMBULATORY_CARE_PROVIDER_SITE_OTHER): Payer: Medicare Other | Admitting: Nurse Practitioner

## 2015-04-04 VITALS — BP 120/80 | Temp 97.9°F | Wt 150.0 lb

## 2015-04-04 DIAGNOSIS — N390 Urinary tract infection, site not specified: Secondary | ICD-10-CM

## 2015-04-04 DIAGNOSIS — R319 Hematuria, unspecified: Secondary | ICD-10-CM | POA: Diagnosis not present

## 2015-04-04 LAB — POCT URINALYSIS DIPSTICK
Blood, UA: 50
Protein, UA: 100
SPEC GRAV UA: 1.015
pH, UA: 5

## 2015-04-04 LAB — POCT UA - MICROSCOPIC ONLY

## 2015-04-04 MED ORDER — LEVOTHYROXINE SODIUM 50 MCG PO TABS: 50.0000 ug | ORAL_TABLET | Freq: Every day | ORAL | Status: DC

## 2015-04-04 MED ORDER — CEFDINIR 300 MG PO CAPS: 300.0000 mg | ORAL_CAPSULE | Freq: Two times a day (BID) | ORAL | Status: DC

## 2015-04-04 NOTE — Patient Instructions (Addendum)
AZO as directed for 48 hours then stop  Urinary Tract Infection Urinary tract infections (UTIs) can develop anywhere along your urinary tract. Your urinary tract is your body's drainage system for removing wastes and extra water. Your urinary tract includes two kidneys, two ureters, a bladder, and a urethra. Your kidneys are a pair of bean-shaped organs. Each kidney is about the size of your fist. They are located below your ribs, one on each side of your spine. CAUSES Infections are caused by microbes, which are microscopic organisms, including fungi, viruses, and bacteria. These organisms are so small that they can only be seen through a microscope. Bacteria are the microbes that most commonly cause UTIs. SYMPTOMS  Symptoms of UTIs may vary by age and gender of the patient and by the location of the infection. Symptoms in young women typically include a frequent and intense urge to urinate and a painful, burning feeling in the bladder or urethra during urination. Older women and men are more likely to be tired, shaky, and weak and have muscle aches and abdominal pain. A fever may mean the infection is in your kidneys. Other symptoms of a kidney infection include pain in your back or sides below the ribs, nausea, and vomiting. DIAGNOSIS To diagnose a UTI, your caregiver will ask you about your symptoms. Your caregiver also will ask to provide a urine sample. The urine sample will be tested for bacteria and white blood cells. White blood cells are made by your body to help fight infection. TREATMENT  Typically, UTIs can be treated with medication. Because most UTIs are caused by a bacterial infection, they usually can be treated with the use of antibiotics. The choice of antibiotic and length of treatment depend on your symptoms and the type of bacteria causing your infection. HOME CARE INSTRUCTIONS  If you were prescribed antibiotics, take them exactly as your caregiver instructs you. Finish the  medication even if you feel better after you have only taken some of the medication.  Drink enough water and fluids to keep your urine clear or pale yellow.  Avoid caffeine, tea, and carbonated beverages. They tend to irritate your bladder.  Empty your bladder often. Avoid holding urine for long periods of time.  Empty your bladder before and after sexual intercourse.  After a bowel movement, women should cleanse from front to back. Use each tissue only once. SEEK MEDICAL CARE IF:   You have back pain.  You develop a fever.  Your symptoms do not begin to resolve within 3 days. SEEK IMMEDIATE MEDICAL CARE IF:   You have severe back pain or lower abdominal pain.  You develop chills.  You have nausea or vomiting.  You have continued burning or discomfort with urination. MAKE SURE YOU:   Understand these instructions.  Will watch your condition.  Will get help right away if you are not doing well or get worse. Document Released: 07/01/2005 Document Revised: 03/22/2012 Document Reviewed: 10/30/2011 [facility] Patient Information 2015 [place], [place]. This information is not intended to replace advice given to you by your health care provider. Make sure you discuss any questions you have with your health care provider.

## 2015-04-04 NOTE — Addendum Note (Signed)
Addended by: Dairl Ponder on: 04/04/2015 03:47 PM   Modules accepted: Orders

## 2015-04-05 ENCOUNTER — Encounter: Payer: Self-pay | Admitting: Nurse Practitioner

## 2015-04-05 DIAGNOSIS — E440 Moderate protein-calorie malnutrition: Secondary | ICD-10-CM | POA: Diagnosis not present

## 2015-04-05 DIAGNOSIS — F329 Major depressive disorder, single episode, unspecified: Secondary | ICD-10-CM | POA: Diagnosis not present

## 2015-04-05 DIAGNOSIS — I480 Paroxysmal atrial fibrillation: Secondary | ICD-10-CM | POA: Diagnosis not present

## 2015-04-05 DIAGNOSIS — J189 Pneumonia, unspecified organism: Secondary | ICD-10-CM | POA: Diagnosis not present

## 2015-04-05 DIAGNOSIS — I10 Essential (primary) hypertension: Secondary | ICD-10-CM | POA: Diagnosis not present

## 2015-04-05 DIAGNOSIS — A047 Enterocolitis due to Clostridium difficile: Secondary | ICD-10-CM | POA: Diagnosis not present

## 2015-04-05 NOTE — Progress Notes (Signed)
Subjective:  Presents for c/o urinary symptoms that began yesterday evening. C/o pain with urination, urgency and frequency. No fever. No vaginal discharge. Minimal right low back pain. No flank pain. No history of recent UTI. Taking fluids well. No vomiting.   Objective:   BP 120/80 mmHg  Temp(Src) 97.9 F (36.6 C) (Oral)  Wt 150 lb (68.04 kg) NAD. Alert, oriented. Lungs clear. Heart RRR. No CVA or flank tenderness. Abdomen soft, non tender.  Results for orders placed or performed in visit on 04/04/15  POCT urinalysis dipstick  Result Value Ref Range   Color, UA yellow    Clarity, UA cloudy    Glucose, UA     Bilirubin, UA     Ketones, UA     Spec Grav, UA 1.015    Blood, UA 50    pH, UA 5.0    Protein, UA 100    Urobilinogen, UA     Nitrite, UA     Leukocytes, UA 4+ (A) Negative  POCT UA - Microscopic Only  Result Value Ref Range   WBC, Ur, HPF, POC TNTC    RBC, urine, microscopic 10+    Bacteria, U Microscopic MULTIPLE    Mucus, UA     Epithelial cells, urine per micros     Crystals, Ur, HPF, POC     Casts, Ur, LPF, POC     Yeast, UA       Assessment: Urinary tract infection with hematuria, site unspecified - Plan: POCT urinalysis dipstick, POCT UA - Microscopic Only, Urine culture  Plan:  Meds ordered this encounter  Medications  . cefdinir (OMNICEF) 300 MG capsule    Sig: Take 1 capsule (300 mg total) by mouth 2 (two) times daily.    Dispense:  14 capsule    Refill:  0    Order Specific Question:  Supervising Provider    Answer:  Mikey Kirschner [[id number]]   AZO as directed x 48 hours then DC. Culture pending. Warning signs reviewed. Call back if worsens or persists.

## 2015-04-06 LAB — URINE CULTURE

## 2015-04-09 ENCOUNTER — Encounter: Payer: Self-pay | Admitting: Family Medicine

## 2015-04-09 ENCOUNTER — Ambulatory Visit (INDEPENDENT_AMBULATORY_CARE_PROVIDER_SITE_OTHER): Payer: Medicare Other | Admitting: Family Medicine

## 2015-04-09 DIAGNOSIS — R110 Nausea: Secondary | ICD-10-CM | POA: Diagnosis not present

## 2015-04-09 DIAGNOSIS — R911 Solitary pulmonary nodule: Secondary | ICD-10-CM

## 2015-04-09 DIAGNOSIS — R5383 Other fatigue: Secondary | ICD-10-CM

## 2015-04-09 DIAGNOSIS — N059 Unspecified nephritic syndrome with unspecified morphologic changes: Secondary | ICD-10-CM

## 2015-04-09 MED ORDER — TRAZODONE HCL 50 MG PO TABS: 25.0000 mg | ORAL_TABLET | Freq: Every evening | ORAL | Status: DC | PRN

## 2015-04-09 NOTE — Progress Notes (Signed)
   Subjective:    Patient ID: Kelsey Sandoval, female    DOB: April 27, 1933, 81 y.o.   MRN: [id number]  HPI Patient is here today for weight loss. Patient states that she is continuously losing weight and she doesn't know why. Patient states that she forces herself to eat daily with no results. Patient is currently not taking any medication to help increase her appetite. Patient also states that she is "just not feeling well."  Patient has no other concerns at this time.  Patient is undergoing special infusions from [facility] to try to help her with her renal nephritis. The side effects from this medication can in fact cause drowsiness nausea weight loss and fatigue so some of this could be related to those infusions some of her symptoms probably related to depression she is going through a lot currently some of her symptoms probably related to thyroid   Review of Systems  Constitutional: Positive for fatigue. Negative for fever.  Respiratory: Negative for cough.   Cardiovascular: Negative for chest pain.  Gastrointestinal: Negative for abdominal pain.       Objective:   Physical Exam  Cardiovascular: Normal rate.   Pulmonary/Chest: Effort normal. No respiratory distress. She has no wheezes.    Skin warm dry      Assessment & Plan:  This patient is concerned about her weight loss She also has ongoing depression She would like to try different medication She states current medication causing her to feel too drowsy We will switch away from that I don't find any evidence of pneumonia or tumors currently but she will need repeat chest CT scan in August. This is already scheduled. She is to follow-up within a few days afterwards regarding pulmonary nodules I believe her fatigue could be related to depression as well as thyroid issues her medication was adjusted we will repeat thyroid test in about 8-12 weeks

## 2015-04-09 NOTE — Patient Instructions (Addendum)
Stop remeron  Use Trazadone one-half  at bedtime for the first week Then if tolerating this then increase to 1 tablets at night  Recheck here in 6 weeks

## 2015-04-10 ENCOUNTER — Encounter: Payer: Self-pay | Admitting: Cardiovascular Disease

## 2015-04-10 DIAGNOSIS — E440 Moderate protein-calorie malnutrition: Secondary | ICD-10-CM | POA: Diagnosis not present

## 2015-04-10 DIAGNOSIS — J189 Pneumonia, unspecified organism: Secondary | ICD-10-CM | POA: Diagnosis not present

## 2015-04-10 DIAGNOSIS — F329 Major depressive disorder, single episode, unspecified: Secondary | ICD-10-CM | POA: Diagnosis not present

## 2015-04-10 DIAGNOSIS — I480 Paroxysmal atrial fibrillation: Secondary | ICD-10-CM | POA: Diagnosis not present

## 2015-04-10 DIAGNOSIS — A047 Enterocolitis due to Clostridium difficile: Secondary | ICD-10-CM | POA: Diagnosis not present

## 2015-04-10 DIAGNOSIS — I10 Essential (primary) hypertension: Secondary | ICD-10-CM | POA: Diagnosis not present

## 2015-04-12 ENCOUNTER — Telehealth: Payer: Self-pay | Admitting: Family Medicine

## 2015-04-12 NOTE — Telephone Encounter (Signed)
pts daughter dropped off her OV notes from her Nephrologist  Put in your office

## 2015-04-16 ENCOUNTER — Encounter (INDEPENDENT_AMBULATORY_CARE_PROVIDER_SITE_OTHER): Payer: Self-pay | Admitting: Internal Medicine

## 2015-04-16 ENCOUNTER — Ambulatory Visit (INDEPENDENT_AMBULATORY_CARE_PROVIDER_SITE_OTHER): Payer: Medicare Other | Admitting: Internal Medicine

## 2015-04-16 VITALS — BP 108/60 | HR 76 | Temp 98.0°F | Ht 69.0 in | Wt 149.4 lb

## 2015-04-16 DIAGNOSIS — R634 Abnormal weight loss: Secondary | ICD-10-CM

## 2015-04-16 DIAGNOSIS — R110 Nausea: Secondary | ICD-10-CM

## 2015-04-16 NOTE — Progress Notes (Signed)
Subjective:    Patient ID: Kelsey Sandoval, female    DOB: 1932/11/18, 81 y.o.   MRN: [id number]  HPI Here today for f/u after recent admission to [facility] for N, and weight loss in June. She had had symptoms for about 6 months. She tells me her normal weight was 175. She says while in hospital she could not stand the smell of food. She has not had any vomiting.  She tells me she feels good. Appetite is better. Her weight today is 149.4 On 03/08/2015 her weight was 149. She has maintained her weight. She is cooking her meals at home.  She is eating two meals a day. There is no abdominal pain. She has a BM daily. Recent colonoscopy in 2015 was normal except for diverticula. She is having one stool a day.  She says she feels 80% better.  03/09/2015 Cortisol normal. Recent Hemoglobin 11.9. She did have a positive C diff in hospital and was covered with Flagyl per records. She c/o bilateral knee pain from osteoarthritis.  Hx of atrial fib and maintained on Pradaxa   CBC    Component Value Date/Time   WBC 6.9 03/08/2015 0718   RBC 3.71* 03/08/2015 0718   HGB 11.9* 03/08/2015 0718   HCT 34.2* 03/08/2015 0718   PLT 212 03/08/2015 0718   MCV 92.2 03/08/2015 0718   MCH 32.1 03/08/2015 0718   MCHC 34.8 03/08/2015 0718   RDW 12.8 03/08/2015 0718   LYMPHSABS 1.9 03/07/2015 1832   MONOABS 0.4 03/07/2015 1832   EOSABS 0.0 03/07/2015 1832   BASOSABS 0.0 03/07/2015 1832      03/09/2015 GES: Normal.  She had colonoscopy on 10/11/2013 for iron deficiency anemia. She had multiple diverticula at sigmoid and descending colon and small anal papilla. She had EGD on 01/17/2014. At small sliding hiatal hernia nonerosive antral gastritis. There was intestinal metaplasia involving antral mucosa. Duodenal biopsy was negative for celiac disease and H. pylori serology was also negative. Anemia has corrected with parenteral iron.  Review of Systems Past Medical History  Diagnosis Date  . Hypertension   .  Dysrhythmia   . Anxiety   . Depression   . Pacemaker   . Arthritis   . Hyperlipidemia   . Pre-diabetes   . Osteopenia   . Atrial fibrillation   . Back pain, chronic   . Cataracts, bilateral   . Urine protein increased   . Nephrotic syndrome     RHUX    Past Surgical History  Procedure Laterality Date  . Insert / replace / remove pacemaker    . Appendectomy    . Abdominal hysterectomy      partial-pt has no ovaries  . Back surgery    . Colonoscopy  9/05  . Dual chamber pacemaker      2012  . Colonoscopy N/A 10/11/2013    Procedure: COLONOSCOPY;  Surgeon: Rogene Houston, MD;  Location: [facility];  Service: Endoscopy;  Laterality: N/A;  1200  . Esophagogastroduodenoscopy N/A 01/17/2014    Procedure: ESOPHAGOGASTRODUODENOSCOPY (EGD);  Surgeon: Rogene Houston, MD;  Location: [facility] END[facility] SU[facility];  Service: Endoscopy;  Laterality: N/A;  230  . Esophageal biopsy N/A 01/17/2014    Procedure: BIOPSY;  Surgeon: Rogene Houston, MD;  Location: [facility] END[facility] SUITE;  Service: Endoscopy;  Laterality: N/A;  . Wisdom tooth extracton    . Cataract extraction w/phaco Left 02/28/2015    Procedure: CATARACT EXTRACTION PHACO AND INTRAOCULAR LENS PLACEMENT (IOC);  Surgeon: Levada Dy  Geoffry Paradise, MD;  Location: [facility] ORS;  Service: Ophthalmology;  Laterality: Left;  CDE 18.71    Allergies  Allergen Reactions  . Penicillins Other (See Comments)    Caused patient to pass out.Can take cephalosporins  . Levaquin [Levofloxacin] Nausea Only  . Sulfa Antibiotics Other (See Comments)    Unknown  . Zithromax [Azithromycin] Nausea Only and Rash    Current Outpatient Prescriptions on File Prior to Visit  Medication Sig Dispense Refill  . albuterol (PROVENTIL HFA;VENTOLIN HFA) 108 (90 BASE) MCG/ACT inhaler Inhale 2 puffs into the lungs every 6 (six) hours as needed for wheezing or shortness of breath. 18 g 5  . ALPRAZolam (XANAX) 1 MG tablet Take 1 tablet (1 mg total) by mouth 2 (two) times daily as needed for anxiety. 60  tablet 5  . amLODipine (NORVASC) 10 MG tablet TAKE ONE TABLET BY MOUTH ONCE DAILY 30 tablet 5  . dabigatran (PRADAXA) 150 MG CAPS capsule Take 1 capsule (150 mg total) by mouth 2 (two) times daily. 180 capsule 0  . famotidine (PEPCID) 20 MG tablet Take 1 tablet (20 mg total) by mouth 2 (two) times daily. 60 tablet 1  . flecainide (TAMBOCOR) 50 MG tablet Take 1 tablet (50 mg total) by mouth 2 (two) times daily. 180 tablet 3  . gabapentin (NEURONTIN) 100 MG capsule Take 2 capsules (200 mg total) by mouth 2 (two) times daily. 120 capsule 1  . HYDROcodone-acetaminophen (NORCO/VICODIN) 5-325 MG per tablet Take 1 tablet by mouth every 4 (four) hours as needed for moderate pain. 120 tablet 0  . levothyroxine (SYNTHROID, LEVOTHROID) 50 MCG tablet Take 1 tablet (50 mcg total) by mouth daily. 30 tablet 3  . lisinopril (PRINIVIL,ZESTRIL) 40 MG tablet TAKE ONE TABLET BY MOUTH ONCE DAILY 30 tablet 5  . metoprolol succinate (TOPROL-XL) 100 MG 24 hr tablet Take 100 mg by mouth daily. Take with or immediately following a meal.    . metoprolol succinate (TOPROL-XL) 50 MG 24 hr tablet TAKE ONE TABLET BY MOUTH ONCE DAILY -TAKE  WITH  OR  IMMEDIATELY  FOLLOWING  A  MEAL 30 tablet 5  . ondansetron (ZOFRAN-ODT) 8 MG disintegrating tablet Take 1 tablet (8 mg total) by mouth every 8 (eight) hours as needed for nausea or vomiting. 30 tablet 3  . pravastatin (PRAVACHOL) 80 MG tablet TAKE ONE TABLET BY MOUTH ONCE DAILY 30 tablet 5  . riTUXimab in sodium chloride 0.9 % 250 mL Infusion 2 weeks apart: May repeat in 6 months. Follow up with primary care doctor prior to initiation of rituximab for infectious clearance  0  . traZODone (DESYREL) 50 MG tablet Take 0.5-1 tablets (25-50 mg total) by mouth at bedtime as needed for sleep. 30 tablet 3  . vitamin B-12 (CYANOCOBALAMIN) 1000 MCG tablet Take 1 tablet (1,000 mcg total) by mouth daily.     No current facility-administered medications on file prior to visit.          Objective:   Physical ExamBlood pressure 108/60, pulse 76, temperature 98 F (36.7 C), height 5\' 9"  (1.753 m), weight 149 lb 6.4 oz (67.767 kg).  Alert and oriented. Skin warm and dry. Oral mucosa is moist.   . Sclera anicteric, conjunctivae is pink. Thyroid not enlarged. No cervical lymphadenopathy. Lungs clear. Heart regular rate and rhythm.  Abdomen is soft. Bowel sounds are positive. No hepatomegaly. No abdominal masses felt. No tenderness.  No edema to lower extremities.         Assessment & Plan:  Nausea and weight loss. She had maintained her weight since discharge.  No nausea at this time. She feel 80% better. OV 4 months

## 2015-04-16 NOTE — Patient Instructions (Signed)
Continue present medications. OV 4 months

## 2015-04-16 NOTE — Telephone Encounter (Signed)
I reviewed over the lab work. The lab work is to be scanned into the system, it shows the decreasing of protein to creatinine ratio she is following up with specialist at [facility]

## 2015-04-17 ENCOUNTER — Other Ambulatory Visit: Payer: Self-pay | Admitting: Family Medicine

## 2015-04-17 NOTE — Telephone Encounter (Signed)
May have this +3 additional refills 

## 2015-04-18 ENCOUNTER — Encounter: Payer: Self-pay | Admitting: Cardiology

## 2015-04-18 DIAGNOSIS — I480 Paroxysmal atrial fibrillation: Secondary | ICD-10-CM | POA: Diagnosis not present

## 2015-04-18 DIAGNOSIS — E440 Moderate protein-calorie malnutrition: Secondary | ICD-10-CM | POA: Diagnosis not present

## 2015-04-18 DIAGNOSIS — I10 Essential (primary) hypertension: Secondary | ICD-10-CM | POA: Diagnosis not present

## 2015-04-18 DIAGNOSIS — F329 Major depressive disorder, single episode, unspecified: Secondary | ICD-10-CM | POA: Diagnosis not present

## 2015-04-18 DIAGNOSIS — J189 Pneumonia, unspecified organism: Secondary | ICD-10-CM | POA: Diagnosis not present

## 2015-04-18 DIAGNOSIS — A047 Enterocolitis due to Clostridium difficile: Secondary | ICD-10-CM | POA: Diagnosis not present

## 2015-04-23 DIAGNOSIS — M25561 Pain in right knee: Secondary | ICD-10-CM | POA: Diagnosis not present

## 2015-04-23 DIAGNOSIS — M1711 Unilateral primary osteoarthritis, right knee: Secondary | ICD-10-CM | POA: Diagnosis not present

## 2015-04-23 DIAGNOSIS — M25562 Pain in left knee: Secondary | ICD-10-CM | POA: Diagnosis not present

## 2015-04-23 DIAGNOSIS — M1712 Unilateral primary osteoarthritis, left knee: Secondary | ICD-10-CM | POA: Diagnosis not present

## 2015-04-23 DIAGNOSIS — Z8679 Personal history of other diseases of the circulatory system: Secondary | ICD-10-CM | POA: Diagnosis not present

## 2015-04-23 DIAGNOSIS — I10 Essential (primary) hypertension: Secondary | ICD-10-CM | POA: Diagnosis not present

## 2015-04-23 DIAGNOSIS — Z6823 Body mass index (BMI) 23.0-23.9, adult: Secondary | ICD-10-CM | POA: Diagnosis not present

## 2015-04-25 DIAGNOSIS — F329 Major depressive disorder, single episode, unspecified: Secondary | ICD-10-CM | POA: Diagnosis not present

## 2015-04-25 DIAGNOSIS — J189 Pneumonia, unspecified organism: Secondary | ICD-10-CM | POA: Diagnosis not present

## 2015-04-25 DIAGNOSIS — I10 Essential (primary) hypertension: Secondary | ICD-10-CM | POA: Diagnosis not present

## 2015-04-25 DIAGNOSIS — E440 Moderate protein-calorie malnutrition: Secondary | ICD-10-CM | POA: Diagnosis not present

## 2015-04-25 DIAGNOSIS — A047 Enterocolitis due to Clostridium difficile: Secondary | ICD-10-CM | POA: Diagnosis not present

## 2015-04-25 DIAGNOSIS — I480 Paroxysmal atrial fibrillation: Secondary | ICD-10-CM | POA: Diagnosis not present

## 2015-05-02 DIAGNOSIS — A047 Enterocolitis due to Clostridium difficile: Secondary | ICD-10-CM | POA: Diagnosis not present

## 2015-05-02 DIAGNOSIS — J189 Pneumonia, unspecified organism: Secondary | ICD-10-CM | POA: Diagnosis not present

## 2015-05-02 DIAGNOSIS — I10 Essential (primary) hypertension: Secondary | ICD-10-CM | POA: Diagnosis not present

## 2015-05-02 DIAGNOSIS — F329 Major depressive disorder, single episode, unspecified: Secondary | ICD-10-CM | POA: Diagnosis not present

## 2015-05-02 DIAGNOSIS — E440 Moderate protein-calorie malnutrition: Secondary | ICD-10-CM | POA: Diagnosis not present

## 2015-05-02 DIAGNOSIS — I480 Paroxysmal atrial fibrillation: Secondary | ICD-10-CM | POA: Diagnosis not present

## 2015-05-08 DIAGNOSIS — E440 Moderate protein-calorie malnutrition: Secondary | ICD-10-CM | POA: Diagnosis not present

## 2015-05-08 DIAGNOSIS — I480 Paroxysmal atrial fibrillation: Secondary | ICD-10-CM | POA: Diagnosis not present

## 2015-05-08 DIAGNOSIS — I10 Essential (primary) hypertension: Secondary | ICD-10-CM | POA: Diagnosis not present

## 2015-05-08 DIAGNOSIS — A047 Enterocolitis due to Clostridium difficile: Secondary | ICD-10-CM | POA: Diagnosis not present

## 2015-05-08 DIAGNOSIS — F329 Major depressive disorder, single episode, unspecified: Secondary | ICD-10-CM | POA: Diagnosis not present

## 2015-05-08 DIAGNOSIS — J189 Pneumonia, unspecified organism: Secondary | ICD-10-CM | POA: Diagnosis not present

## 2015-05-16 ENCOUNTER — Ambulatory Visit (INDEPENDENT_AMBULATORY_CARE_PROVIDER_SITE_OTHER): Payer: Medicare Other | Admitting: Family Medicine

## 2015-05-16 ENCOUNTER — Encounter: Payer: Self-pay | Admitting: Family Medicine

## 2015-05-16 VITALS — BP 136/80 | Temp 97.5°F | Ht 69.0 in | Wt 142.5 lb

## 2015-05-16 DIAGNOSIS — R634 Abnormal weight loss: Secondary | ICD-10-CM | POA: Diagnosis not present

## 2015-05-16 DIAGNOSIS — N059 Unspecified nephritic syndrome with unspecified morphologic changes: Secondary | ICD-10-CM

## 2015-05-16 DIAGNOSIS — E039 Hypothyroidism, unspecified: Secondary | ICD-10-CM

## 2015-05-16 DIAGNOSIS — G894 Chronic pain syndrome: Secondary | ICD-10-CM

## 2015-05-16 NOTE — Progress Notes (Addendum)
   Subjective:    Patient ID: Kelsey Sandoval, female    DOB: [birth year], 81 y.o.   MRN: [id number]  HPI Patient states that she has been feeling fatigue, and extremely drowsy  for a little over a week.Patient also states that she is unable to eat and is now forcing herself to eat. Patient states that she quit remeron due to night terrors.  This patient has significant underlying renal disease she did have a infusion to help this but I do not think that is the reason she is having problems. She denies any severe abdominal pain just relates nausea and no appetite. She is under a lot of stress from various issues see below  Review of Systems  Constitutional: Negative for activity change, appetite change and fatigue.  HENT: Negative for congestion.   Respiratory: Negative for cough.   Cardiovascular: Negative for chest pain.  Gastrointestinal: Negative for abdominal pain.  Endocrine: Negative for polydipsia and polyphagia.  Neurological: Negative for weakness.  Psychiatric/Behavioral: Negative for confusion.       Objective:   Physical Exam  Constitutional: She appears well-nourished. No distress.  Cardiovascular: Normal rate, regular rhythm and normal heart sounds.   No murmur heard. Pulmonary/Chest: Effort normal and breath sounds normal. No respiratory distress.  Musculoskeletal: She exhibits no edema.  Lymphadenopathy:    She has no cervical adenopathy.  Neurological: She is alert. She exhibits normal muscle tone.  Psychiatric: Her behavior is normal.  Vitals reviewed.   I was able to review over the notes from [facility] including her lab work.      Assessment & Plan:  It is hard to tell if the patient's lack of appetite weight loss is related to all the stressful events she is going through between health problems for herself, health problems for her significant other in stress issues within the family. I do believe it would be reasonable to check lab work. If this lab work comes  back normal then I would lean toward treating with anti-depressed we tried a different antidepressive Remeron but she could not tolerate the side effects. It would be in her best interest to follow-up within 2 weeks.  Due to her weight loss and loss of appetite we will consult endocrinology to see if there may be adrenal insufficiency going on.

## 2015-05-17 ENCOUNTER — Other Ambulatory Visit: Payer: Self-pay

## 2015-05-17 ENCOUNTER — Telehealth: Payer: Self-pay | Admitting: Family Medicine

## 2015-05-17 DIAGNOSIS — E2740 Unspecified adrenocortical insufficiency: Secondary | ICD-10-CM

## 2015-05-17 LAB — CBC WITH DIFFERENTIAL/PLATELET
BASOS: 0 %
Basophils Absolute: 0 10*3/uL (ref 0.0–0.2)
EOS (ABSOLUTE): 0.1 10*3/uL (ref 0.0–0.4)
EOS: 1 %
Hematocrit: 36.8 % (ref 34.0–46.6)
Hemoglobin: 12.4 g/dL (ref 11.1–15.9)
Immature Grans (Abs): 0 10*3/uL (ref 0.0–0.1)
Immature Granulocytes: 0 %
LYMPHS ABS: 1.4 10*3/uL (ref 0.7–3.1)
Lymphs: 19 %
MCH: 32 pg (ref 26.6–33.0)
MCHC: 33.7 g/dL (ref 31.5–35.7)
MCV: 95 fL (ref 79–97)
MONOCYTES: 7 %
Monocytes Absolute: 0.5 10*3/uL (ref 0.1–0.9)
NEUTROS ABS: 5.4 10*3/uL (ref 1.4–7.0)
Neutrophils: 73 %
Platelets: 197 10*3/uL (ref 150–379)
RBC: 3.87 x10E6/uL (ref 3.77–5.28)
RDW: 13.2 % (ref 12.3–15.4)
WBC: 7.5 10*3/uL (ref 3.4–10.8)

## 2015-05-17 LAB — BASIC METABOLIC PANEL
BUN/Creatinine Ratio: 26 (ref 11–26)
BUN: 31 mg/dL — AB (ref 8–27)
CHLORIDE: 104 mmol/L (ref 97–108)
CO2: 20 mmol/L (ref 18–29)
CREATININE: 1.2 mg/dL — AB (ref 0.57–1.00)
Calcium: 9.2 mg/dL (ref 8.7–10.3)
GFR calc non Af Amer: 43 mL/min/{1.73_m2} — ABNORMAL LOW (ref >59)
GFR, EST AFRICAN AMERICAN: 50 mL/min/{1.73_m2} — AB (ref >59)
Glucose: 90 mg/dL (ref 65–99)
Potassium: 5 mmol/L (ref 3.5–5.2)
Sodium: 140 mmol/L (ref 134–144)

## 2015-05-17 LAB — TSH: TSH: 3.11 u[IU]/mL (ref 0.450–4.500)

## 2015-05-17 NOTE — Telephone Encounter (Signed)
See original phone message from 04/12/15,  Daughter has now dropped a second copy, this is also in the  Labs, scanned on the 28th of June and located under media for  Your viewing as well. Assured Ms Kelsey Sandoval that we do have them An apologized that [name] was told we did not have them for the  Doc to review. Apparently this upset Ms Brief that we did not have this  Information.   Copy in your basket in your office

## 2015-05-20 NOTE — Telephone Encounter (Signed)
Because of her weight loss and loss of appetite we put in for a referral to endocrinology for evaluation of adrenal insufficiency.

## 2015-05-20 NOTE — Telephone Encounter (Signed)
Please talk with the patient. I did review over the lab work and copies of medical records that was dropped off by her daughter. Please assure the patient that results had already been seen by me through the electronic portal with [facility]. At this point I would recommend trying an antidepressant. This particular medicine may give her more energy as well as I believe it could stimulate her appetite. I would try Effexor XR 75 mg one daily, #30, 2 refills, patient has follow-up visit already scheduled please keep this. Let the patient know that we may need to do more tests depending on how she does

## 2015-05-21 ENCOUNTER — Telehealth: Payer: Self-pay | Admitting: Cardiovascular Disease

## 2015-05-21 DIAGNOSIS — N3000 Acute cystitis without hematuria: Secondary | ICD-10-CM | POA: Diagnosis not present

## 2015-05-21 DIAGNOSIS — N059 Unspecified nephritic syndrome with unspecified morphologic changes: Secondary | ICD-10-CM | POA: Diagnosis not present

## 2015-05-21 DIAGNOSIS — N022 Recurrent and persistent hematuria with diffuse membranous glomerulonephritis: Secondary | ICD-10-CM | POA: Diagnosis not present

## 2015-05-21 MED ORDER — VENLAFAXINE HCL ER 75 MG PO CP24
75.0000 mg | ORAL_CAPSULE | Freq: Every day | ORAL | Status: DC
Start: 2015-05-21 — End: 2015-08-18

## 2015-05-21 NOTE — Addendum Note (Signed)
Addended by: Jesusita Oka on: 05/21/2015 08:54 AM   Modules accepted: Orders

## 2015-05-21 NOTE — Telephone Encounter (Signed)
Notified patient Dr. Nicki Reaper did review over the lab work and copies of medical records that was dropped off by her daughter. Assured the patient that results had already been seen by Dr. Nicki Reaper through the electronic portal with [facility]. At this point Dr. Nicki Reaper would recommend trying an antidepressant. This particular medicine may give her more energy as well as stimulate her appetite. Dr. Nicki Reaper said he would try Effexor XR 75 mg one daily, #30, 2 refills, patient has follow-up visit already scheduled please keep this. Notified patient that we may need to do more tests depending on how she does. Per Izora Ribas- referral has already been processed to Dr. Liliane Channel office. Med sent to pharmacy. Patient verbalized understanding.

## 2015-05-21 NOTE — Telephone Encounter (Signed)
Calling to get some verification on her Metoprolol . The medication is listed twice one for 100,g and one for 50mg  . Not sure which is supposed to take . Please call

## 2015-05-22 NOTE — Telephone Encounter (Signed)
According to record patient was on Metoprolol 100 mg up to her admission and discharge from the hospital when it was dropped down to 50 mg  Talked with daughter who was able to comfirm the dosage on her current bottle of medication to be metoprolol succinate 50 mg daily

## 2015-05-23 NOTE — Telephone Encounter (Signed)
Please stay on 50. Read notes from hospital and PCP. BP is good, very little arrhythmia and going back to 100 would probably worsen fatigue. 50 mg daily for now

## 2015-05-27 ENCOUNTER — Ambulatory Visit (HOSPITAL_COMMUNITY)
Admission: RE | Admit: 2015-05-27 | Discharge: 2015-05-27 | Disposition: A | Discharge status: Home / Self Care | Payer: Medicare Other | Source: Ambulatory Visit | Attending: Family Medicine | Admitting: Family Medicine

## 2015-05-27 DIAGNOSIS — R911 Solitary pulmonary nodule: Secondary | ICD-10-CM | POA: Insufficient documentation

## 2015-05-27 DIAGNOSIS — R630 Anorexia: Secondary | ICD-10-CM | POA: Insufficient documentation

## 2015-05-27 DIAGNOSIS — R918 Other nonspecific abnormal finding of lung field: Secondary | ICD-10-CM | POA: Diagnosis not present

## 2015-05-27 MED ORDER — IOHEXOL 300 MG/ML  SOLN
64.0000 mL | Freq: Once | INTRAMUSCULAR | Status: AC | PRN
  Administered 2015-05-27: 64 mL via INTRAVENOUS

## 2015-05-28 ENCOUNTER — Ambulatory Visit (INDEPENDENT_AMBULATORY_CARE_PROVIDER_SITE_OTHER): Payer: Medicare Other | Admitting: Family Medicine

## 2015-05-28 ENCOUNTER — Encounter: Payer: Self-pay | Admitting: Family Medicine

## 2015-05-28 VITALS — BP 110/70 | Ht 69.0 in | Wt 151.0 lb

## 2015-05-28 DIAGNOSIS — N049 Nephrotic syndrome with unspecified morphologic changes: Secondary | ICD-10-CM | POA: Diagnosis not present

## 2015-05-28 DIAGNOSIS — R5383 Other fatigue: Secondary | ICD-10-CM | POA: Diagnosis not present

## 2015-05-28 NOTE — Progress Notes (Signed)
   Subjective:    Patient ID: Kelsey Sandoval, female    DOB: 08/31/1933, 81 y.o.   MRN: [id number]  HPI Patient is here for a follow up visit on loss of appetite, weight loss and depression. Patient states that she is feeling much better with the recent medication changes.  Patient has no other concerns at this time.   Patient's appetite doing better eating better. She is put on a good 8 pounds since last being seen. She feels better more energetic. She is seen the specialist at [facility] who has done some further treatments. Review of Systems  Constitutional: Negative for fever and fatigue.  HENT: Negative for congestion.   Cardiovascular: Negative for chest pain.  Gastrointestinal: Negative for nausea and vomiting.  Psychiatric/Behavioral: The patient is not nervous/anxious.    The CT scan report from 822 was reviewed with the patient.    Objective:   Physical Exam  Constitutional: She appears well-nourished. No distress.  Cardiovascular: Normal rate, regular rhythm and normal heart sounds.   No murmur heard. Pulmonary/Chest: Effort normal and breath sounds normal. No respiratory distress.  Musculoskeletal: She exhibits no edema.  Lymphadenopathy:    She has no cervical adenopathy.  Neurological: She is alert. She exhibits normal muscle tone.  Psychiatric: Her behavior is normal.  Vitals reviewed.         Assessment & Plan:  fatigue-I believe that this is related into her depression is actually doing much better. Continue current medication. Follow-up again in 3 months  Hypertension-her blood pressure today 116/70. Reading the previous cardiologist's note I recommend that she stick with Toprol 50 mg XL.  Nephrotic syndrome-according to the notes from [facility] it looks like the treatments or having a positive effect. They did do a lipid profile which showed triglycerides approximately 325 LDL approximately 44 and HDL approximately 50. They also checked her albumen which was up to  3.3.  Flu vaccine later this year.  I believe the patient has significant depression which is much better with the current treatment. She is to follow-up sooner problems.

## 2015-06-03 ENCOUNTER — Encounter: Payer: Self-pay | Admitting: Cardiovascular Disease

## 2015-06-03 ENCOUNTER — Ambulatory Visit (INDEPENDENT_AMBULATORY_CARE_PROVIDER_SITE_OTHER): Payer: Medicare Other | Admitting: Cardiovascular Disease

## 2015-06-03 VITALS — BP 156/86 | HR 74 | Ht 70.0 in | Wt 148.8 lb

## 2015-06-03 DIAGNOSIS — I495 Sick sinus syndrome: Secondary | ICD-10-CM | POA: Diagnosis not present

## 2015-06-03 DIAGNOSIS — I480 Paroxysmal atrial fibrillation: Secondary | ICD-10-CM | POA: Diagnosis not present

## 2015-06-03 DIAGNOSIS — I10 Essential (primary) hypertension: Secondary | ICD-10-CM

## 2015-06-03 DIAGNOSIS — Z950 Presence of cardiac pacemaker: Secondary | ICD-10-CM

## 2015-06-03 DIAGNOSIS — I340 Nonrheumatic mitral (valve) insufficiency: Secondary | ICD-10-CM

## 2015-06-03 MED ORDER — DABIGATRAN ETEXILATE MESYLATE 150 MG PO CAPS: 150.0000 mg | ORAL_CAPSULE | Freq: Two times a day (BID) | ORAL | Status: DC

## 2015-06-03 NOTE — Patient Instructions (Addendum)
Your physician wants you to follow-up in:1 [place] [address]. You will receive a reminder letter in the mail two months in advance. If you don't receive a letter, please call our office to schedule the follow-up appointment.  Your physician recommends that you continue on your current medications as directed. Please refer to the Current Medication list given to you today.  I HAVE SENT IN A NEW PRESCRIPTION FOPR PRADAXA   Thanks for choosing [facility]!!!

## 2015-06-03 NOTE — Progress Notes (Signed)
Kelsey Sandoval ID: Kelsey Sandoval, female   DOB: [birth year], 81 y.o.   MRN: [id number]      SUBJECTIVE: The Kelsey Sandoval presents to establish cardiac care with me. She was previously seen by Dr. Sallyanne Kuster in 06/2014. She has a history of a pacemaker for tachycardia-bradycardia syndrome (implanted 05/15/11), essential hypertension, and paroxysmal atrial fibrillation. She has been maintained on flecainide and Pradaxa.  Normal nuclear stress test on 07/23/10, LVEF >70%.  Pacemaker interrogation on 03/31/15 demonstrated normal function with appropriate sensing and capture. There were 33 mode switches , less than 0.1% , with no high ventricular rates.  Denies chest pain and palpitations. Very aggravated today due to issues with her basement "falling apart". Says BP is usually well controlled and was 116/70 on 8/23 at her PCP's office.  Normally walks with a cane due to sciatic nerve issues.   Review of Systems: As per "subjective", otherwise negative.  Allergies  Allergen Reactions  . Penicillins Other (See Comments)    Caused Kelsey Sandoval to pass out.Can take cephalosporins  . Levaquin [Levofloxacin] Nausea Only  . Sulfa Antibiotics Other (See Comments)    Unknown  . Zithromax [Azithromycin] Nausea Only and Rash    Current Outpatient Prescriptions  Medication Sig Dispense Refill  . albuterol (PROVENTIL HFA;VENTOLIN HFA) 108 (90 BASE) MCG/ACT inhaler Inhale 2 puffs into the lungs every 6 (six) hours as needed for wheezing or shortness of breath. 18 g 5  . ALPRAZolam (XANAX) 1 MG tablet TAKE ONE TABLET BY MOUTH TWICE DAILY AS NEEDED FOR ANXIETY 60 tablet 3  . amLODipine (NORVASC) 10 MG tablet TAKE ONE TABLET BY MOUTH ONCE DAILY 30 tablet 5  . dabigatran (PRADAXA) 150 MG CAPS capsule Take 1 capsule (150 mg total) by mouth 2 (two) times daily. 180 capsule 0  . famotidine (PEPCID) 20 MG tablet Take 1 tablet (20 mg total) by mouth 2 (two) times daily. 60 tablet 1  . flecainide (TAMBOCOR) 50 MG tablet Take 1  tablet (50 mg total) by mouth 2 (two) times daily. 180 tablet 3  . gabapentin (NEURONTIN) 100 MG capsule Take 2 capsules (200 mg total) by mouth 2 (two) times daily. 120 capsule 1  . HYDROcodone-acetaminophen (NORCO/VICODIN) 5-325 MG per tablet Take 1 tablet by mouth every 4 (four) hours as needed for moderate pain. 120 tablet 0  . levothyroxine (SYNTHROID, LEVOTHROID) 50 MCG tablet Take 1 tablet (50 mcg total) by mouth daily. 30 tablet 3  . lisinopril (PRINIVIL,ZESTRIL) 40 MG tablet TAKE ONE TABLET BY MOUTH ONCE DAILY 30 tablet 5  . metoprolol succinate (TOPROL-XL) 50 MG 24 hr tablet TAKE ONE TABLET BY MOUTH ONCE DAILY -TAKE  WITH  OR  IMMEDIATELY  FOLLOWING  A  MEAL 30 tablet 5  . ondansetron (ZOFRAN-ODT) 8 MG disintegrating tablet Take 1 tablet (8 mg total) by mouth every 8 (eight) hours as needed for nausea or vomiting. 30 tablet 3  . pravastatin (PRAVACHOL) 80 MG tablet TAKE ONE TABLET BY MOUTH ONCE DAILY 30 tablet 5  . riTUXimab in sodium chloride 0.9 % 250 mL Infusion 2 weeks apart: May repeat in 6 months. Follow up with primary care doctor prior to initiation of rituximab for infectious clearance  0  . venlafaxine XR (EFFEXOR XR) 75 MG 24 hr capsule Take 1 capsule (75 mg total) by mouth daily with breakfast. 30 capsule 2  . vitamin B-12 (CYANOCOBALAMIN) 1000 MCG tablet Take 1 tablet (1,000 mcg total) by mouth daily.     No current facility-administered medications for  this visit.    Past Medical History  Diagnosis Date  . Hypertension   . Dysrhythmia   . Anxiety   . Depression   . Pacemaker   . Arthritis   . Hyperlipidemia   . Pre-diabetes   . Osteopenia   . Atrial fibrillation   . Back pain, chronic   . Cataracts, bilateral   . Urine protein increased   . Nephrotic syndrome     RHUX    Past Surgical History  Procedure Laterality Date  . Insert / replace / remove pacemaker    . Appendectomy    . Abdominal hysterectomy      partial-pt has no ovaries  . Back surgery      . Colonoscopy  9/05  . Dual chamber pacemaker      2012  . Colonoscopy N/A 10/11/2013    Procedure: COLONOSCOPY;  Surgeon: Rogene Houston, MD;  Location: [facility];  Service: Endoscopy;  Laterality: N/A;  1200  . Esophagogastroduodenoscopy N/A 01/17/2014    Procedure: ESOPHAGOGASTRODUODENOSCOPY (EGD);  Surgeon: Rogene Houston, MD;  Location: [facility];  Service: Endoscopy;  Laterality: N/A;  230  . Esophageal biopsy N/A 01/17/2014    Procedure: BIOPSY;  Surgeon: Rogene Houston, MD;  Location: [facility];  Service: Endoscopy;  Laterality: N/A;  . Wisdom tooth extracton    . Cataract extraction w/phaco Left 02/28/2015    Procedure: CATARACT EXTRACTION PHACO AND INTRAOCULAR LENS PLACEMENT (IOC);  Surgeon: [name], MD;  Location: [facility];  Service: Ophthalmology;  Laterality: Left;  CDE 18.71    Social History   Social History  . Marital Status: Divorced    Spouse Name: N/A  . Number of Children: N/A  . Years of Education: N/A   Occupational History  . Not on file.   Social History Main Topics  . Smoking status: Never Smoker   . Smokeless tobacco: Never Used  . Alcohol Use: No  . Drug Use: No  . Sexual Activity: Not on file   Other Topics Concern  . Not on file   Social History Narrative     Filed Vitals:   06/03/15 1254  BP: 156/86  Pulse: 74  Height: 5\' 10"  (1.778 m)  Weight: 148 lb 12.8 oz (67.495 kg)  SpO2: 96%    PHYSICAL EXAM General: NAD HEENT: Normal. Neck: No JVD, no thyromegaly. Lungs: Clear to auscultation bilaterally with normal respiratory effort. CV: Nondisplaced PMI.  Regular rate and rhythm, normal S1/S2, no S3/S4, no murmur. No pretibial or periankle edema.  No carotid bruit.  Abdomen: Soft, nontender, no distention.  Neurologic: Alert and oriented x 3.  Psych: Normal affect. Skin: Normal. Musculoskeletal: No gross deformities. Extremities: No clubbing or cyanosis.   ECG: Most recent ECG reviewed.      ASSESSMENT AND  PLAN: Paroxysmal atrial fibrillation  Symptomatically stable since September 2012 when flecainide was started. Note that prior to flecainide therapy she had a roughly 5% burden of atrial fibrillation. No history of embolic TIA or stroke. No serious bleeding complications. Tolerating flecainide well. Normal nuclear stress test at therapy initiation in 07/2010.  No changes to therapy.  Tachycardia-bradycardia syndrome/Pacemaker  Normal function of dual-chamber [employer] device implanted in August of 2012. Comprehensive pacemaker check, including testing of lead thresholds was performed on 03/31/15 with results noted above.   Mitral insufficiency  This was estimated to be moderate at initial evaluation but only mild by transesophageal echo.  Essential hypertension  Excellent control usually.  Her blood pressure is slightly high today. She blames on being upset with issues involving her house. Normal at PCP's on 8/23. No changes to therapy.  Time spent: 40 minutes, of which greater than 50% was spent reviewing symptoms, relevant blood tests and studies, and discussing management plan with the Kelsey Sandoval.   Kate Sable, M.D., F.A.C.C.

## 2015-06-07 ENCOUNTER — Encounter (HOSPITAL_COMMUNITY): Payer: Medicare Other | Attending: Oncology | Admitting: Oncology

## 2015-06-07 ENCOUNTER — Encounter (HOSPITAL_COMMUNITY): Payer: Self-pay | Admitting: Oncology

## 2015-06-07 ENCOUNTER — Ambulatory Visit (HOSPITAL_COMMUNITY): Payer: PRIVATE HEALTH INSURANCE | Admitting: Oncology

## 2015-06-07 ENCOUNTER — Encounter (HOSPITAL_COMMUNITY): Payer: Medicare Other

## 2015-06-07 VITALS — BP 166/91 | HR 75 | Temp 97.7°F | Resp 18 | Wt 149.4 lb

## 2015-06-07 DIAGNOSIS — N022 Recurrent and persistent hematuria with diffuse membranous glomerulonephritis: Secondary | ICD-10-CM | POA: Diagnosis not present

## 2015-06-07 DIAGNOSIS — D509 Iron deficiency anemia, unspecified: Secondary | ICD-10-CM | POA: Diagnosis not present

## 2015-06-07 HISTORY — DX: Recurrent and persistent hematuria with diffuse membranous glomerulonephritis: N02.2

## 2015-06-07 LAB — CBC WITH DIFFERENTIAL/PLATELET
Basophils Absolute: 0 10*3/uL (ref 0.0–0.1)
Basophils Relative: 0 % (ref 0–1)
EOS ABS: 0 10*3/uL (ref 0.0–0.7)
Eosinophils Relative: 0 % (ref 0–5)
HCT: 34 % — ABNORMAL LOW (ref 36.0–46.0)
HEMOGLOBIN: 11.7 g/dL — AB (ref 12.0–15.0)
LYMPHS ABS: 1.3 10*3/uL (ref 0.7–4.0)
Lymphocytes Relative: 24 % (ref 12–46)
MCH: 32.1 pg (ref 26.0–34.0)
MCHC: 34.4 g/dL (ref 30.0–36.0)
MCV: 93.4 fL (ref 78.0–100.0)
Monocytes Absolute: 0.6 10*3/uL (ref 0.1–1.0)
Monocytes Relative: 11 % (ref 3–12)
NEUTROS PCT: 65 % (ref 43–77)
Neutro Abs: 3.5 10*3/uL (ref 1.7–7.7)
Platelets: 213 10*3/uL (ref 150–400)
RBC: 3.64 MIL/uL — AB (ref 3.87–5.11)
RDW: 12.1 % (ref 11.5–15.5)
WBC: 5.5 10*3/uL (ref 4.0–10.5)

## 2015-06-07 LAB — IRON AND TIBC
Iron: 70 ug/dL (ref 28–170)
Saturation Ratios: 25 % (ref 10.4–31.8)
TIBC: 276 ug/dL (ref 250–450)
UIBC: 206 ug/dL

## 2015-06-07 LAB — COMPREHENSIVE METABOLIC PANEL
ALBUMIN: 3.3 g/dL — AB (ref 3.5–5.0)
ALK PHOS: 53 U/L (ref 38–126)
ALT: 16 U/L (ref 14–54)
AST: 23 U/L (ref 15–41)
Anion gap: 4 — ABNORMAL LOW (ref 5–15)
BUN: 31 mg/dL — ABNORMAL HIGH (ref 6–20)
CO2: 22 mmol/L (ref 22–32)
CREATININE: 1.21 mg/dL — AB (ref 0.44–1.00)
Calcium: 8.4 mg/dL — ABNORMAL LOW (ref 8.9–10.3)
Chloride: 108 mmol/L (ref 101–111)
GFR calc non Af Amer: 41 mL/min — ABNORMAL LOW (ref >60)
GFR, EST AFRICAN AMERICAN: 48 mL/min — AB (ref >60)
GLUCOSE: 96 mg/dL (ref 65–99)
Potassium: 4.8 mmol/L (ref 3.5–5.1)
SODIUM: 134 mmol/L — AB (ref 135–145)
Total Bilirubin: 0.4 mg/dL (ref 0.3–1.2)
Total Protein: 5.6 g/dL — ABNORMAL LOW (ref 6.5–8.1)

## 2015-06-07 LAB — FERRITIN: Ferritin: 225 ng/mL (ref 11–307)

## 2015-06-07 NOTE — Assessment & Plan Note (Addendum)
Followed by Dr. Leslee Home at [facility] and having received Rituxan infusions on 02/20/15 and 04/02/2015.  Her next follow-up with Dr. Ulice Dash is in November, the patient reports.

## 2015-06-07 NOTE — Assessment & Plan Note (Addendum)
Iron deficiency anemia with intolerance to PO iron in the setting of Stage III renal disease.  S/P IV Feraheme 510 mg on 2/11 and 11/22/2014.  Since receiving IV Feraheme, Hgb has been nearly or WNL.  Labs today: CBC diff, Iron/TIBC, ferritin  Labs in 3 and 6 months: CBC diff, Iron/TIBC, ferritin  Return in 6 months.  She was referred to Dr. Dorris Fetch (Endocrinology) and her consult appointment is on 06/15/2015.

## 2015-06-07 NOTE — Progress Notes (Signed)
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 female returns for followup of iron deficiency anemia with intolerance to PO iron in the setting of Stage III renal disease.  I personally reviewed and went over laboratory results with the patient.  The results are noted within this dictation.  Hgb is nearly WNL as of 3 weeks ago.  We will update them today with iron studies.  Chart is reviewed.  Hospitalization in June 2016 is noted.  I personally reviewed and went over laboratory results with the patient.  The results are noted within this dictation.  I personally reviewed and went over radiographic studies with the patient.  The results are noted within this dictation.    Kelsey Sandoval sees a nephrologist at [facility], Dr. Leslee Home for membranous nephropathy.  She saw him last on 8/16 and portions of his note from [place] follows: HPI: Kelsey Sandoval a 81 y.o. year old female with a remote history of membranous nephropathy, previously treated with high dose prednisone, resulting in reported remission. She presented with recurrent L Ext swelling and proteinuria starting around the summer of 2015. Work up revealed a negative HBsAg, HCV, SPEP, and EGD + colonoscopy (per patient's report).  Repeat biopsy revealed MN again. PLA2r staining could not be performed.  She has an extensive exposure to smoking, and has chronic lung disease requiring inhalers. She also had an extensive exposure to NSAID, but UPr/Cr did not improve ~ 6 months post stopping. She was evaluated in March. Cancer work up was completed with CXR (CT not covered by insurance) ; normal mammo. Anti-PLA2r antibodies: 75 RU/ml ("normal < 14) Albumin was 3.1 In terms of therapeutic options, The presence of mod-severe  renal arteriosclerosis was relative contraindication for treatment with calcineurin inhibitors. After discussing with patient we opted for treatment with rituximab. She received 1000 g IV on 5/18 and 04/02/15  She was admitted to [facility] in mid June, before the second infusion, with a UTI Exhaustion, nausea (no C diff?). Had reportedly an extensive work up.   ASSESSMENT/PLAN: KelseyAslan Sandoval is being seen in clinic for the following:  1. Clinically seems to be stable / improving from NS point of view. Recheck antiPLA2r. eGFR stable.  BP under good control. Althought he proteinuria persists, the serum albumin is improved (near normal) and hyperlipidemia is well controlled. L Ext edema is much improved. I therefore think that she is on the way to remission. A sig decrease in anti-PLA2r titer would portend a good prognosis for remission. No change in meds. 2. Had extensive imaging work up. No signs of Ca. To have a CT of chest in coming weeks. 3. Symptoms may well be attributable to depression from friend's illness, given the extensive negative work up  4. Possible UTI.Marland Kitchen Urine sent for culture  KelseyGrisell Sandoval will follow up in 3 months.     Chart is reviewed.  I see that the patient has been referred to Dr. Dorris Fetch (Endocrinology) by her primary care provider, Dr. Wolfgang Phoenix.  She confirms that this appointment is scheduled for 9/10.  She also has follow-up with Dr. Ulice Dash at [facility] in November.  Unfortunately, the inner wall in her basement has collapsed and therefore, she had to call a company to provide bracing to support  her house until she is able to determine if insurance will help cover the repair, otherwise, she is unsure how she can afford the fix.  She denies any specific anemia complaints.  She notes that she is tired and fatigued, which seems to be a chronic issue, particularly in light of her co-morbidities, but we will maintain her iron stores to prevent iron  deficiency-induced fatigue.  Past Medical History  Diagnosis Date  . Hypertension   . Dysrhythmia   . Anxiety   . Depression   . Pacemaker   . Arthritis   . Hyperlipidemia   . Pre-diabetes   . Osteopenia   . Atrial fibrillation   . Back pain, chronic   . Cataracts, bilateral   . Urine protein increased   . Nephrotic syndrome     RHUX  . 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     is allergic to penicillins; levaquin; sulfa antibiotics; and zithromax.  Current Outpatient Prescriptions on File Prior to Visit  Medication Sig Dispense Refill  . albuterol (PROVENTIL HFA;VENTOLIN HFA) 108 (90 BASE) MCG/ACT inhaler Inhale 2 puffs into the lungs every 6 (six) hours as needed for wheezing or shortness of breath. 18 g 5  . ALPRAZolam (XANAX) 1 MG tablet TAKE ONE TABLET BY MOUTH TWICE DAILY AS NEEDED FOR ANXIETY 60 tablet 3  . amLODipine (NORVASC) 10 MG tablet TAKE ONE TABLET BY MOUTH ONCE DAILY 30 tablet 5  . dabigatran (PRADAXA) 150 MG CAPS capsule Take 1 capsule (150 mg total) by mouth 2 (two) times daily. 180 capsule 6  . famotidine (PEPCID) 20 MG tablet Take 1 tablet (20 mg total) by mouth 2 (two) times daily. 60 tablet 1  . flecainide (TAMBOCOR) 50 MG tablet Take 1 tablet (50 mg total) by mouth 2 (two) times daily. 180 tablet 3  . gabapentin (NEURONTIN) 100 MG capsule Take 2 capsules (200 mg total) by  mouth 2 (two) times daily. 120 capsule 1  . HYDROcodone-acetaminophen (NORCO/VICODIN) 5-325 MG per tablet Take 1 tablet by mouth every 4 (four) hours as needed for moderate pain. 120 tablet 0  . levothyroxine (SYNTHROID, LEVOTHROID) 50 MCG tablet Take 1 tablet (50 mcg total) by mouth daily. 30 tablet 3  . lisinopril (PRINIVIL,ZESTRIL) 40 MG tablet TAKE ONE TABLET BY MOUTH ONCE DAILY 30 tablet 5  . metoprolol succinate (TOPROL-XL) 50 MG 24 hr tablet TAKE ONE TABLET BY MOUTH ONCE DAILY -TAKE  WITH  OR  IMMEDIATELY  FOLLOWING  A  MEAL 30 tablet 5  . ondansetron (ZOFRAN-ODT) 8 MG disintegrating tablet Take 1 tablet (8 mg total) by mouth every 8 (eight) hours as needed for nausea or vomiting. 30 tablet 3  . pravastatin (PRAVACHOL) 80 MG tablet TAKE ONE TABLET BY MOUTH ONCE DAILY 30 tablet 5  . venlafaxine XR (EFFEXOR XR) 75 MG 24 hr capsule Take 1 capsule (75 mg total) by mouth daily with breakfast. 30 capsule 2  . vitamin B-12 (CYANOCOBALAMIN) 1000 MCG tablet Take 1 tablet (1,000 mcg total) by mouth daily.    . riTUXimab in sodium chloride 0.9 % 250  mL Infusion 2 weeks apart: May repeat in 6 months. Follow up with primary care doctor prior to initiation of rituximab for infectious clearance  0   No current facility-administered medications on file prior to visit.    Past Surgical History  Procedure Laterality Date  . Insert / replace / remove pacemaker    . Appendectomy    . Abdominal hysterectomy      partial-pt has no ovaries  . Back surgery    . Colonoscopy  9/05  . Dual chamber pacemaker      2012  . Colonoscopy N/A 10/11/2013    Procedure: COLONOSCOPY;  Surgeon: Rogene Houston, MD;  Location: [facility];  Service: Endoscopy;  Laterality: N/A;  1200  . Esophagogastroduodenoscopy N/A 01/17/2014    Procedure: ESOPHAGOGASTRODUODENOSCOPY (EGD);  Surgeon: Rogene Houston, MD;  Location: [facility];  Service: Endoscopy;  Laterality: N/A;  230  . Esophageal biopsy N/A 01/17/2014     Procedure: BIOPSY;  Surgeon: Rogene Houston, MD;  Location: [facility];  Service: Endoscopy;  Laterality: N/A;  . Wisdom tooth extracton    . Cataract extraction w/phaco Left 02/28/2015    Procedure: CATARACT EXTRACTION PHACO AND INTRAOCULAR LENS PLACEMENT (IOC);  Surgeon: [name], MD;  Location: [facility] ORS;  Service: Ophthalmology;  Laterality: Left;  CDE 18.71    Denies any headaches, dizziness, double vision, fevers, chills, night sweats, nausea, vomiting, diarrhea, constipation, chest pain, heart palpitations, shortness of breath, blood in stool, black tarry stool, urinary pain, urinary burning, urinary frequency, hematuria.   PHYSICAL EXAMINATION  ECOG PERFORMANCE STATUS: 1 - Symptomatic but completely ambulatory  Filed Vitals:   06/07/15 1323  BP: 166/91  Pulse: 75  Temp: 97.7 F (36.5 C)  Resp: 18    GENERAL:alert, no distress, well nourished, well developed, comfortable, cooperative and smiling SKIN: skin color, texture, turgor are normal, no rashes or significant lesions HEAD: Normocephalic, No masses, lesions, tenderness or abnormalities EYES: normal, PERRLA, EOMI EARS: External ears normal OROPHARYNX:lips, buccal mucosa, and tongue normal and mucous membranes are moist  NECK: supple, trachea midline LYMPH:  not examined BREAST:not examined LUNGS: clear to auscultation  HEART: regular rate & rhythm ABDOMEN:abdomen soft and normal bowel sounds BACK: Back symmetric, no curvature., No CVA tenderness EXTREMITIES:less then 2 second capillary refill, no joint deformities, effusion, or inflammation, no skin discoloration, no cyanosis  NEURO: alert & oriented x 3 with fluent speech, no focal motor/sensory deficits, gait normal    LABORATORY DATA: CBC    Component Value Date/Time   WBC 7.5 05/16/2015 1209   WBC 6.9 03/08/2015 0718   RBC 3.87 05/16/2015 1209   RBC 3.71* 03/08/2015 0718   HGB 11.9* 03/08/2015 0718   HCT 36.8 05/16/2015 1209   HCT 34.2* 03/08/2015  0718   PLT 212 03/08/2015 0718   MCV 92.2 03/08/2015 0718   MCH 32.0 05/16/2015 1209   MCH 32.1 03/08/2015 0718   MCHC 33.7 05/16/2015 1209   MCHC 34.8 03/08/2015 0718   RDW 13.2 05/16/2015 1209   RDW 12.8 03/08/2015 0718   LYMPHSABS 1.4 05/16/2015 1209   LYMPHSABS 1.9 03/07/2015 1832   MONOABS 0.4 03/07/2015 1832   EOSABS 0.0 03/07/2015 1832   BASOSABS 0.0 05/16/2015 1209   BASOSABS 0.0 03/07/2015 1832      Chemistry      Component Value Date/Time   NA 140 05/16/2015 1209   NA 141 03/10/2015 0600   K 5.0 05/16/2015 1209   CL 104 05/16/2015 1209  CO2 20 05/16/2015 1209   BUN 31* 05/16/2015 1209   BUN 17 03/10/2015 0600   CREATININE 1.20* 05/16/2015 1209   CREATININE 1.24* 03/01/2014 1017   CREATININE 1.24* 02/27/2014 1008      Component Value Date/Time   CALCIUM 9.2 05/16/2015 1209   ALKPHOS 55 03/08/2015 0718   AST 21 03/08/2015 0718   ALT 13* 03/08/2015 0718   BILITOT 0.7 03/08/2015 0718   BILITOT 0.2 02/13/2015 1512      Lab Results  Component Value Date   IRON 64 11/06/2014   TIBC 311 11/06/2014   FERRITIN 275 02/04/2015    PENDING LABS:   RADIOGRAPHIC STUDIES:  Ct Chest W Contrast  05/27/2015   CLINICAL DATA:  Loss of appetite. Tired for 6 months. Pulmonary nodule.  EXAM: CT CHEST WITH CONTRAST  TECHNIQUE: Multidetector CT imaging of the chest was performed during intravenous contrast administration.  CONTRAST:  63m OMNIPAQUE IOHEXOL 300 MG/ML  SOLN  COMPARISON:  CT abdomen 03/07/2015  FINDINGS: Cluster of micro nodules and macro nodules in the left lower lobe peripherally are stable since prior CT. These are most compatible with post infectious process, possibly atypical infection or fungal disease. No other pulmonary nodules. Scarring noted in the lingula. No pleural effusions.  Right pacer in place. Heart is upper limits normal in size. Small scattered mediastinal lymph nodes, none pathologically enlarged and likely reactive. No hilar or axillary  adenopathy. Chest wall soft tissues are unremarkable. Imaging into the upper abdomen shows no acute findings.  No acute bony abnormality or focal bone lesion.  IMPRESSION: Clustered nodules in the left lower lobe are likely postinfectious and may be related to chronic atypical infection  No acute findings.   Electronically Signed   By: KRolm BaptiseM.D.   On: 05/27/2015 11:10     PATHOLOGY:    ASSESSMENT AND PLAN:  Anemia, iron deficiency Iron deficiency anemia with intolerance to PO iron in the setting of Stage III renal disease.  S/P IV Feraheme 510 mg on 2/11 and 11/22/2014.  Since receiving IV Feraheme, Hgb has been nearly or WNL.  Labs today: CBC diff, Iron/TIBC, ferritin  Labs in 3 and 6 months: CBC diff, Iron/TIBC, ferritin  Return in 6 months.  She was referred to Dr. NDorris Fetch(Endocrinology) and her consult appointment is on 06/15/2015.  Membranous nephropathy determined by biopsy [facility] 02/20/15 and 04/02/2015.  Her next follow-up with Dr. NUlice Dashis in November, the patient reports.    THERAPY PLAN:  We will continue to monitor labs and maintain iron stores.  All questions were answered. The patient knows to call the clinic with any problems, questions or concerns. We can certainly see the patient much sooner if necessary.  Patient and plan discussed with Dr. SAncil Linseyand she is in agreement with the aforementioned.   This note is electronically signed by: KDoy Mince9/11/2014 2:03 PM

## 2015-06-07 NOTE — Patient Instructions (Signed)
[place] at [facility] Discharge Instructions  RECOMMENDATIONS MADE BY THE CONSULTANT AND ANY TEST RESULTS WILL BE SENT TO YOUR REFERRING PHYSICIAN.  Exam and discussion by Robynn Pane, PA-C If any concerns with your labs we will call you. Report increased fatigue or shortness of breath.  Labs every 3 months and office visit in 6 months.  Thank you for choosing [place] at [facility] to provide your oncology and hematology care.  To afford each patient quality time with our provider, please arrive at least 15 minutes before your scheduled appointment time.    You need to re-schedule your appointment should you arrive 10 or more minutes late.  We strive to give you quality time with our providers, and arriving late affects you and other patients whose appointments are after yours.  Also, if you no show three or more times for appointments you may be dismissed from the clinic at the providers discretion.     Again, thank you for choosing [facility].  Our hope is that these requests will decrease the amount of time that you wait before being seen by our physicians.       _____________________________________________________________  Should you have questions after your visit to [facility], please contact our office at [phone] between the hours of 8:30 a.m. and 4:30 p.m.  Voicemails left after 4:30 p.m. will not be returned until the following business day.  For prescription refill requests, have your pharmacy contact our office.

## 2015-06-16 IMAGING — US US ABDOMEN COMPLETE
1 series · 14 of 25 positions shown · non-contrast
Comparison: CT abdomen and pelvis 03/07/2015

CLINICAL DATA: Cholecystitis, nausea, vomiting, history
hypertension, hyperlipidemia, renal insufficiency

EXAM:
ULTRASOUND ABDOMEN COMPLETE

[Series 1: us abdomen complete · 0.20mm/px · 14 of 75 slices shown]
[im 1/75]
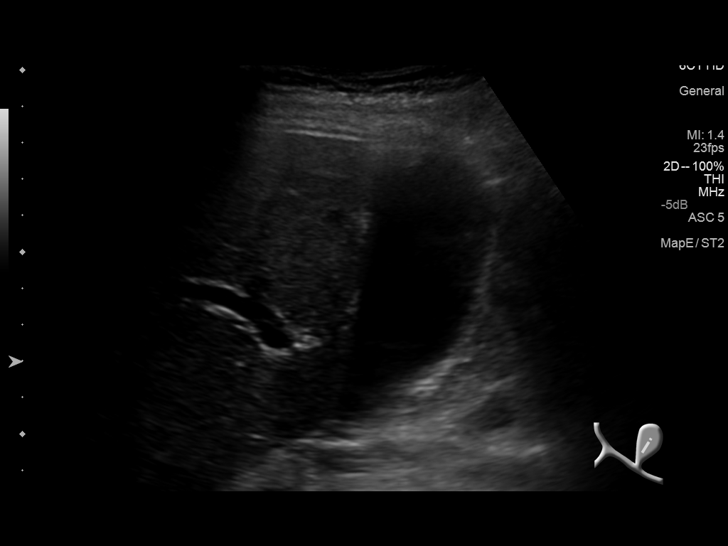
[im 7/75]
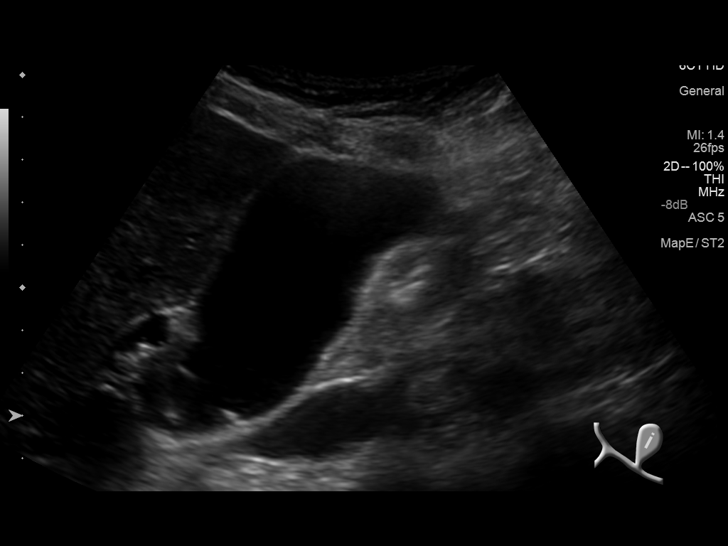
[im 13/75]
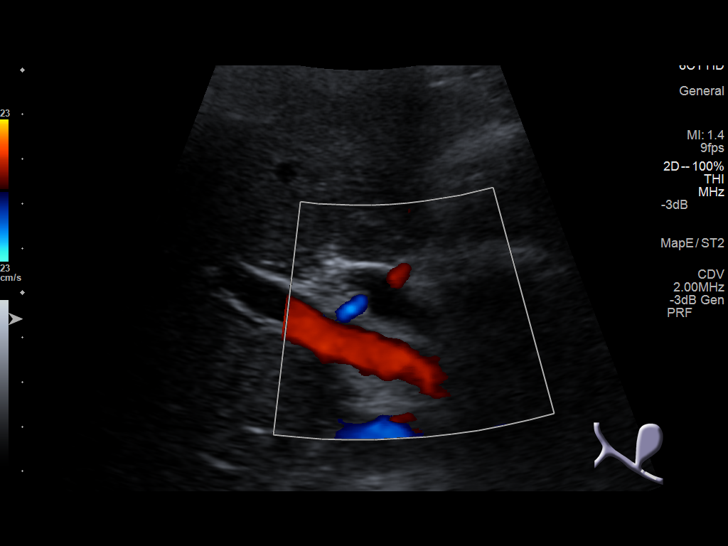
[im 19/75]
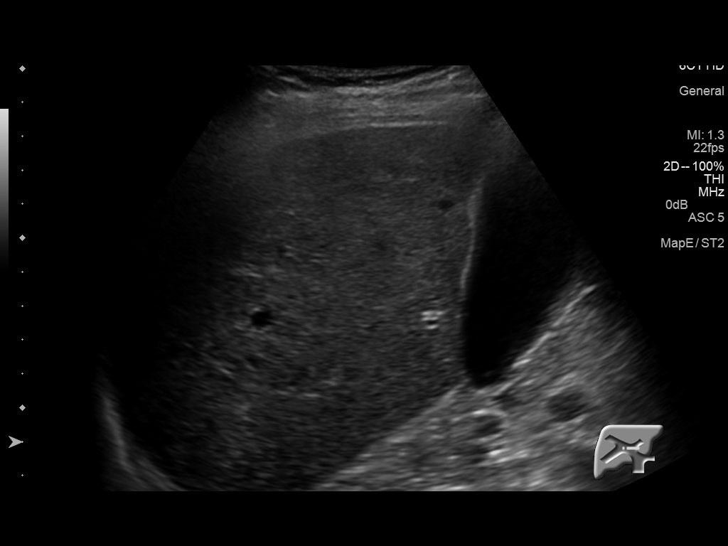
[im 25/75]
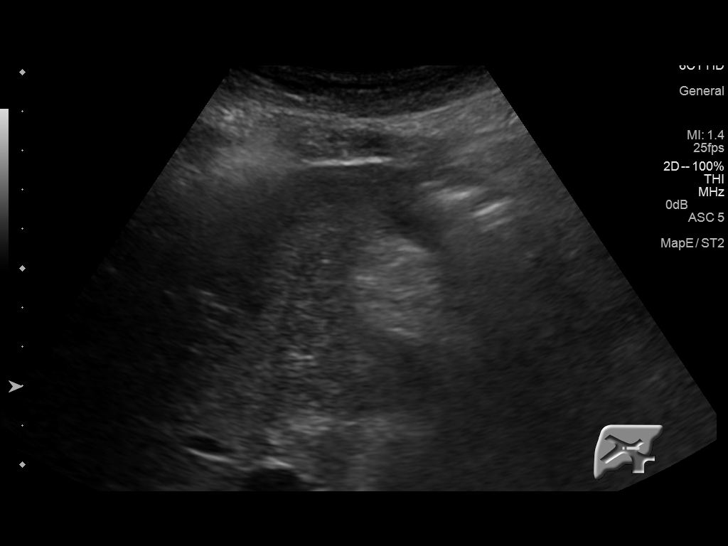
[im 28/75]
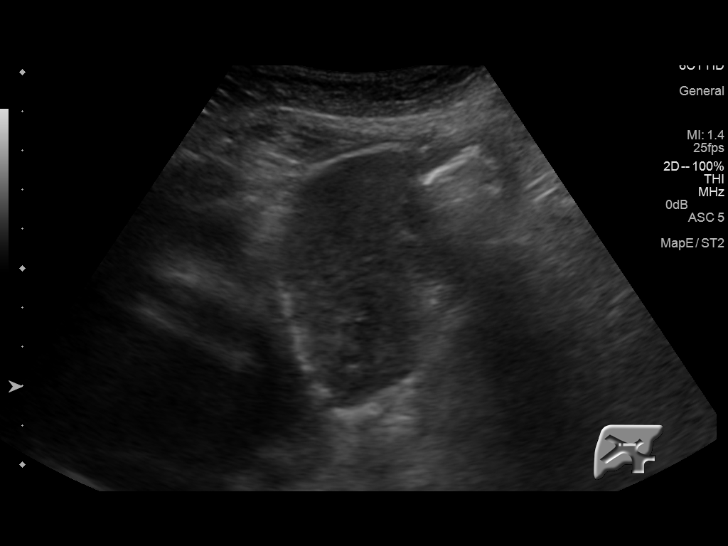
[im 34/75]
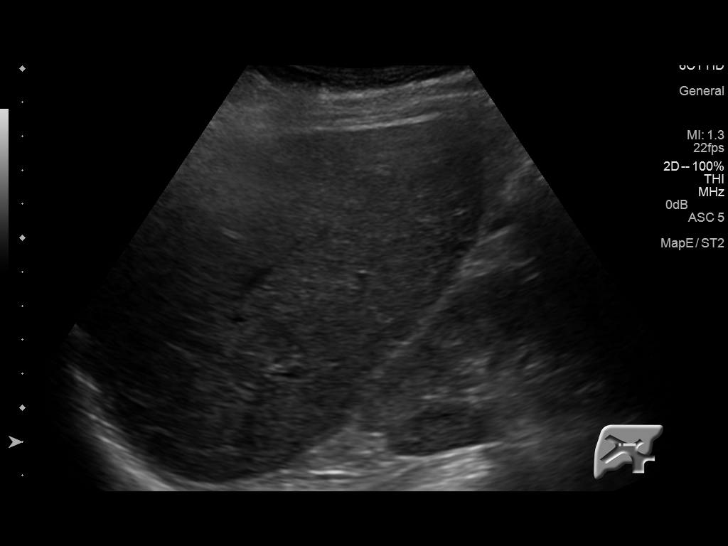
[im 41/75]
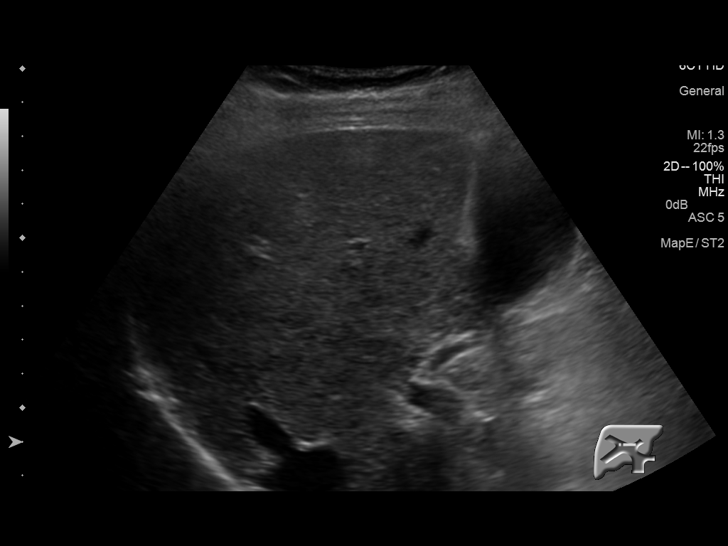
[im 47/75]
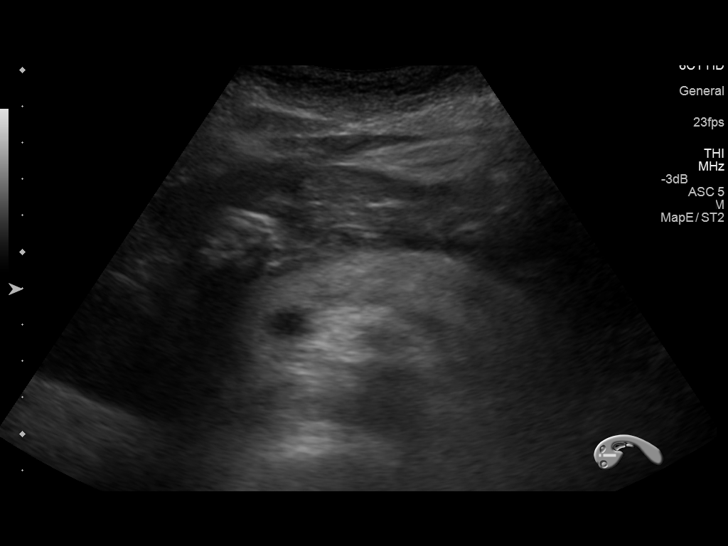
[im 50/75]
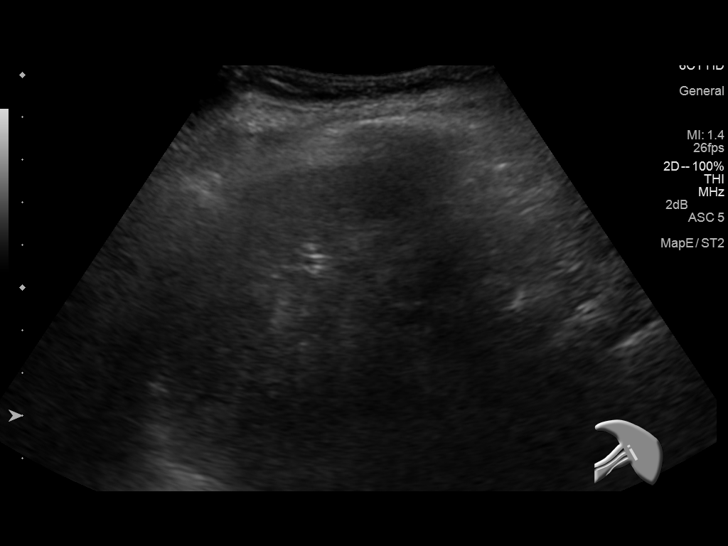
[im 56/75]
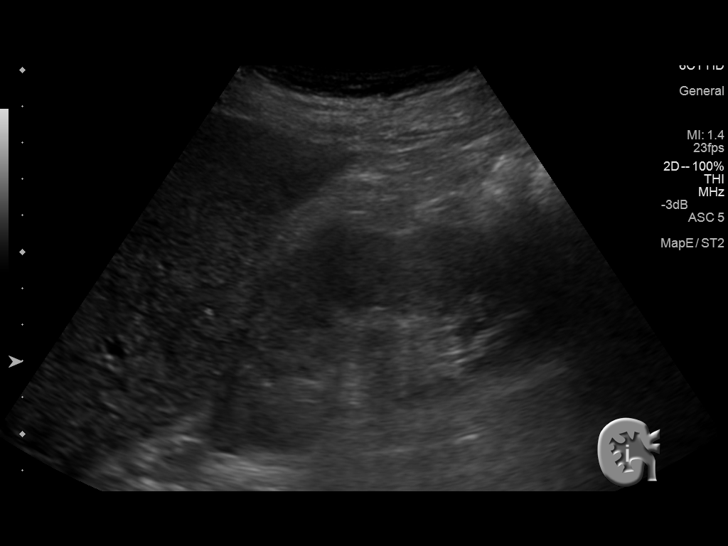
[im 62/75]
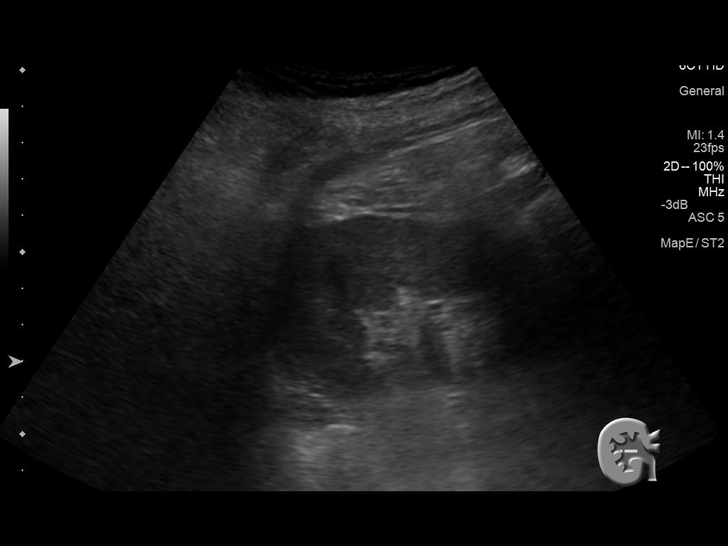
[im 68/75]
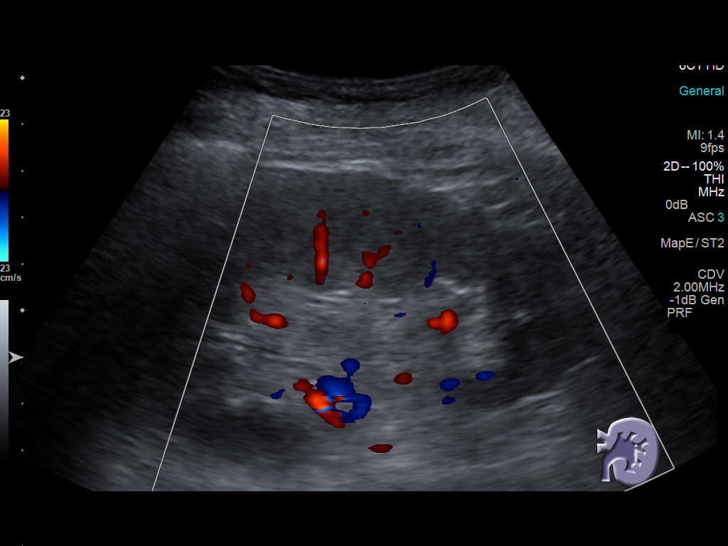
[im 75/75]
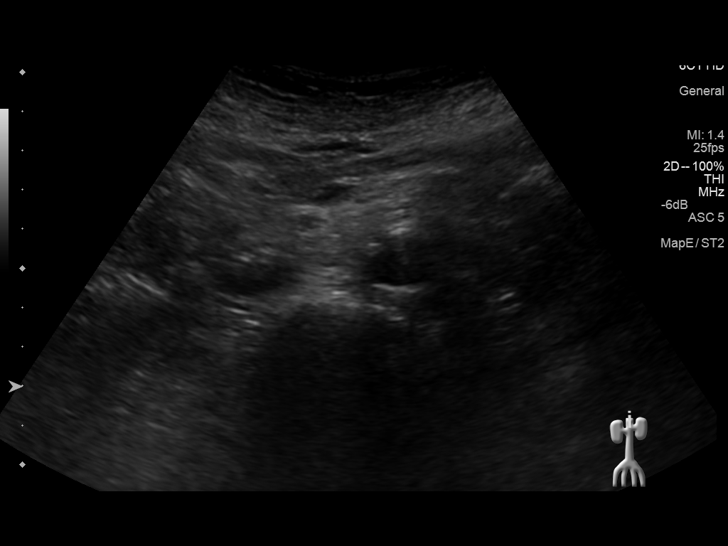

[14 of 25 positions shown; findings below may reference images not displayed]

FINDINGS: Gallbladder: Well distended without stones or wall thickening. No
pericholecystic fluid or sonographic Murphy sign.

Common bile duct: Diameter: 9 mm diameter, minimally prominent for
age.

Liver: Normal appearance

IVC: Normal appearance

Pancreas: Normal appearance

Spleen: Poorly visualized due to high subcostal position, grossly
normal size 5.3 cm length.

Right Kidney: Length: 12.2 cm. Normal morphology without mass or
hydronephrosis.

Left Kidney: Length: 10.0 cm. Normal morphology without mass or
hydronephrosis.

Abdominal aorta: Obscured proximally by bowel gas. Visualized
portions normal caliber.

Other findings: No free-fluid
IMPRESSION: No evidence of gallstones or sonographic features of acute
cholecystitis.

Prominent CBD 9 mm diameter, recommend correlation with LFTs.

## 2015-06-24 ENCOUNTER — Encounter (INDEPENDENT_AMBULATORY_CARE_PROVIDER_SITE_OTHER): Payer: Self-pay | Admitting: *Deleted

## 2015-06-29 ENCOUNTER — Other Ambulatory Visit: Payer: Self-pay | Admitting: Family Medicine

## 2015-07-04 ENCOUNTER — Encounter: Payer: Self-pay | Admitting: Family Medicine

## 2015-07-04 ENCOUNTER — Ambulatory Visit (INDEPENDENT_AMBULATORY_CARE_PROVIDER_SITE_OTHER): Payer: Medicare Other | Admitting: Family Medicine

## 2015-07-04 VITALS — BP 138/82 | Ht 69.0 in | Wt 153.1 lb

## 2015-07-04 DIAGNOSIS — Z23 Encounter for immunization: Secondary | ICD-10-CM

## 2015-07-04 DIAGNOSIS — G894 Chronic pain syndrome: Secondary | ICD-10-CM | POA: Diagnosis not present

## 2015-07-04 MED ORDER — HYDROCODONE-ACETAMINOPHEN 5-325 MG PO TABS: 1.0000 | ORAL_TABLET | ORAL | Status: DC | PRN

## 2015-07-04 MED ORDER — GABAPENTIN 100 MG PO CAPS: 200.0000 mg | ORAL_CAPSULE | Freq: Two times a day (BID) | ORAL | Status: DC

## 2015-07-04 NOTE — Progress Notes (Signed)
   Subjective:    Patient ID: Kelsey Sandoval, female    DOB: 1933-05-20, 81 y.o.   MRN: [id number]  HPI This patient was seen today for chronic pain  The medication list was reviewed and updated.   -Compliance with pain medication: Takes medication as prescribed  The patient was advised the importance of maintaining medication and not using illegal substances with these.  Refills needed: Yes  The patient was educated that we can provide 3 monthly scripts for their medication, it is their responsibility to follow the instructions.  Side effects or complications from medications: none  Patient is aware that pain medications are meant to minimize the severity of the pain to allow their pain levels to improve to allow for better function. They are aware of that pain medications cannot totally remove their pain.  Due for UDT ( at least once per year) : March 26, 2015  Patient would like to get a refill on Gabapentin 100 MG.  Review of Systems  Constitutional: Negative for activity change, appetite change and fatigue.  HENT: Negative for congestion.   Respiratory: Negative for cough.   Cardiovascular: Negative for chest pain.  Gastrointestinal: Negative for abdominal pain.  Endocrine: Negative for polydipsia and polyphagia.  Neurological: Negative for weakness.  Psychiatric/Behavioral: Negative for confusion.       Objective:   Physical Exam  Constitutional: She appears well-nourished. No distress.  Cardiovascular: Normal rate, regular rhythm and normal heart sounds.   No murmur heard. Pulmonary/Chest: Effort normal and breath sounds normal. No respiratory distress.  Musculoskeletal: She exhibits no edema.  Lymphadenopathy:    She has no cervical adenopathy.  Neurological: She is alert. She exhibits normal muscle tone.  Psychiatric: Her behavior is normal.  Vitals reviewed.         Assessment & Plan:  Chronic pain management she uses pain medicine anywhere from 2-4  times a day she denies abusing it states it does not cause drowsiness or nausea or vomiting we gave her 3 prescriptions today she is to follow-up in approximately 3 months she Arty has an appointment 40 and in November  Chronic fatigue tiredness and weight loss actually her weight start to improve energy level feeling better and her moods are doing better. So therefore she is planning on keeping her follow-up at the end in November.

## 2015-07-08 ENCOUNTER — Other Ambulatory Visit: Payer: Self-pay | Admitting: Internal Medicine

## 2015-07-09 ENCOUNTER — Encounter: Payer: Self-pay | Admitting: *Deleted

## 2015-07-19 ENCOUNTER — Ambulatory Visit (INDEPENDENT_AMBULATORY_CARE_PROVIDER_SITE_OTHER): Payer: Medicare Other | Admitting: "Endocrinology

## 2015-07-19 ENCOUNTER — Encounter: Payer: Self-pay | Admitting: "Endocrinology

## 2015-07-19 VITALS — BP 131/79 | HR 73 | Ht 69.0 in | Wt 151.0 lb

## 2015-07-19 DIAGNOSIS — E349 Endocrine disorder, unspecified: Secondary | ICD-10-CM

## 2015-07-19 DIAGNOSIS — E039 Hypothyroidism, unspecified: Secondary | ICD-10-CM | POA: Diagnosis not present

## 2015-07-19 NOTE — Progress Notes (Signed)
Subjective:    Patient ID: Kelsey Sandoval, female    DOB: March 21, 1933,    Past Medical History  Diagnosis Date  . Hypertension   . Dysrhythmia   . Anxiety   . Depression   . Pacemaker   . Arthritis   . Hyperlipidemia   . Pre-diabetes   . Osteopenia   . Atrial fibrillation ([place])   . Back pain, chronic   . Cataracts, bilateral   . Urine protein increased   . Nephrotic syndrome     RHUX  . Membranous nephropathy determined by biopsy 06/07/2015   Past Surgical History  Procedure Laterality Date  . Insert / replace / remove pacemaker    . Appendectomy    . Abdominal hysterectomy      partial-pt has no ovaries  . Back surgery    . Colonoscopy  9/05  . Dual chamber pacemaker      2012  . Colonoscopy N/A 10/11/2013    Procedure: COLONOSCOPY;  Surgeon: Rogene Houston, MD;  Location: [facility];  Service: Endoscopy;  Laterality: N/A;  1200  . Esophagogastroduodenoscopy N/A 01/17/2014    Procedure: ESOPHAGOGASTRODUODENOSCOPY (EGD);  Surgeon: Rogene Houston, MD;  Location: [facility];  Service: Endoscopy;  Laterality: N/A;  230  . Esophageal biopsy N/A 01/17/2014    Procedure: BIOPSY;  Surgeon: Rogene Houston, MD;  Location: [facility];  Service: Endoscopy;  Laterality: N/A;  . Wisdom tooth extracton    . Cataract extraction w/phaco Left 02/28/2015    Procedure: CATARACT EXTRACTION PHACO AND INTRAOCULAR LENS PLACEMENT (IOC);  Surgeon: [name], MD;  Location: [facility];  Service: Ophthalmology;  Laterality: Left;  CDE 18.71   Social History   Social History  . Marital Status: Divorced    Spouse Name: N/A  . Number of Children: N/A  . Years of Education: N/A   Social History Main Topics  . Smoking status: Never Smoker   . Smokeless tobacco: Never Used  . Alcohol Use: No  . Drug Use: No  . Sexual Activity: Not Asked   Other Topics Concern  . None   Social History Narrative   Outpatient Encounter Prescriptions as of 07/19/2015  Medication Sig  . albuterol  (PROVENTIL HFA;VENTOLIN HFA) 108 (90 BASE) MCG/ACT inhaler Inhale 2 puffs into the lungs every 6 (six) hours as needed for wheezing or shortness of breath.  . ALPRAZolam (XANAX) 1 MG tablet TAKE ONE TABLET BY MOUTH TWICE DAILY AS NEEDED FOR ANXIETY  . amLODipine (NORVASC) 10 MG tablet TAKE ONE TABLET BY MOUTH ONCE DAILY  . dabigatran (PRADAXA) 150 MG CAPS capsule Take 1 capsule (150 mg total) by mouth 2 (two) times daily.  . famotidine (PEPCID) 20 MG tablet Take 1 tablet (20 mg total) by mouth 2 (two) times daily. (Patient not taking: Reported on 07/04/2015)  . flecainide (TAMBOCOR) 50 MG tablet TAKE ONE TABLET BY MOUTH TWICE DAILY  . gabapentin (NEURONTIN) 100 MG capsule Take 2 capsules (200 mg total) by mouth 2 (two) times daily.  Marland Kitchen HYDROcodone-acetaminophen (NORCO/VICODIN) 5-325 MG tablet Take 1 tablet by mouth every 4 (four) hours as needed for moderate pain.  Marland Kitchen levothyroxine (SYNTHROID, LEVOTHROID) 50 MCG tablet Take 1 tablet (50 mcg total) by mouth daily.  Marland Kitchen lisinopril (PRINIVIL,ZESTRIL) 40 MG tablet TAKE ONE TABLET BY MOUTH ONCE DAILY  . metoprolol succinate (TOPROL-XL) 50 MG 24 hr tablet TAKE ONE TABLET BY MOUTH ONCE DAILY WITH  OR  IMMEDIATELY  FOLLOWING  A  MEAL  . ondansetron (ZOFRAN-ODT) 8 MG disintegrating tablet Take 1 tablet (8 mg total) by mouth every 8 (eight) hours as needed for nausea or vomiting.  . pravastatin (PRAVACHOL) 80 MG tablet TAKE ONE TABLET BY MOUTH ONCE DAILY  . riTUXimab in sodium chloride 0.9 % 250 mL Infusion 2 weeks apart: May repeat in 6 months. Follow up with primary care doctor prior to initiation of rituximab for infectious clearance  . venlafaxine XR (EFFEXOR XR) 75 MG 24 hr capsule Take 1 capsule (75 mg total) by mouth daily with breakfast.  . vitamin B-12 (CYANOCOBALAMIN) 1000 MCG tablet Take 1 tablet (1,000 mcg total) by mouth daily.   No facility-administered encounter medications on file as of 07/19/2015.   ALLERGIES: Allergies  Allergen Reactions  .  Penicillins Other (See Comments)    Caused patient to pass out.Can take cephalosporins  . Levaquin [Levofloxacin] Nausea Only  . Sulfa Antibiotics Other (See Comments)    Unknown  . Zithromax [Azithromycin] Nausea Only and Rash   VACCINATION STATUS: Immunization History  Administered Date(s) Administered  . Influenza Split 07/11/2013  . Influenza,inj,Quad PF,36+ Mos 06/19/2014, 07/04/2015  . Pneumococcal Conjugate-13 03/29/2014  . Zoster 10/05/2010    HPI Kelsey Sandoval's 81 year old female patient with multiple medical problems. She is being seen in consultation for fatigue with concern of adrenal insufficiency requested by Dr. Jenene Slicker, M.D. She has hypothyroidism on thyroid hormone, hypertension, hyperlipidemia, iron deficiency anemia on. Would take iron infusion. She recently was hospitalized for weakness. Her random cortisol test was determined to be 11 (normal 6-22). She denies recent exposure to high-dose steroids. She is on bronchodilator due to asthma. She denies history of smoking, she denies abdominal injury or systemic infections. She denies family history of adrenal, pituitary dysfunctions.  Review of Systems  Constitutional: no weight gain/loss, no fatigue, no subjective hyperthermia/hypothermia Eyes: no blurry vision, no xerophthalmia ENT: no sore throat, no nodules palpated in throat, no dysphagia/odynophagia, no hoarseness Cardiovascular: no CP/SOB/palpitations/leg swelling Respiratory: no cough/SOB Gastrointestinal: no N/V/D/C Musculoskeletal: no muscle/joint aches Skin: no rashes Neurological: no tremors/numbness/tingling/dizziness Psychiatric: no depression/anxiety   Objective:    BP 131/79 mmHg  Pulse 73  Ht 5\' 9"  (1.753 m)  Wt 151 lb (68.493 kg)  BMI 22.29 kg/m2  SpO2 97%  Wt Readings from Last 3 Encounters:  07/19/15 151 lb (68.493 kg)  07/04/15 153 lb 2 oz (69.457 kg)  06/07/15 149 lb 6.4 oz (67.767 kg)    Physical Exam  Constitutional:  overweight, in NAD Eyes: PERRLA, EOMI, no exophthalmos ENT: moist mucous membranes, no thyromegaly, no cervical lymphadenopathy Cardiovascular: RRR, No MRG Respiratory: CTA B Gastrointestinal: abdomen soft, NT, ND, BS+ Musculoskeletal: no deformities, strength intact in all 4 Skin: moist, warm, no rashes Neurological: no tremor with outstretched hands, DTR normal in all 4  Results for orders placed or performed in visit on 06/07/15  CBC with Differential  Result Value Ref Range   WBC 5.5 4.0 - 10.5 K/uL   RBC 3.64 (L) 3.87 - 5.11 MIL/uL   Hemoglobin 11.7 (L) 12.0 - 15.0 g/dL   HCT 34.0 (L) 36.0 - 46.0 %   MCV 93.4 78.0 - 100.0 fL   MCH 32.1 26.0 - 34.0 pg   MCHC 34.4 30.0 - 36.0 g/dL   RDW 12.1 11.5 - 15.5 %   Platelets 213 150 - 400 K/uL   Neutrophils Relative % 65 43 - 77 %   Neutro Abs 3.5 1.7 - 7.7 K/uL   Lymphocytes Relative 24  12 - 46 %   Lymphs Abs 1.3 0.7 - 4.0 K/uL   Monocytes Relative 11 3 - 12 %   Monocytes Absolute 0.6 0.1 - 1.0 K/uL   Eosinophils Relative 0 0 - 5 %   Eosinophils Absolute 0.0 0.0 - 0.7 K/uL   Basophils Relative 0 0 - 1 %   Basophils Absolute 0.0 0.0 - 0.1 K/uL  Comprehensive metabolic panel  Result Value Ref Range   Sodium 134 (L) 135 - 145 mmol/L   Potassium 4.8 3.5 - 5.1 mmol/L   Chloride 108 101 - 111 mmol/L   CO2 22 22 - 32 mmol/L   Glucose, Bld 96 65 - 99 mg/dL   BUN 31 (H) 6 - 20 mg/dL   Creatinine, Ser 1.21 (H) 0.44 - 1.00 mg/dL   Calcium 8.4 (L) 8.9 - 10.3 mg/dL   Total Protein 5.6 (L) 6.5 - 8.1 g/dL   Albumin 3.3 (L) 3.5 - 5.0 g/dL   AST 23 15 - 41 U/L   ALT 16 14 - 54 U/L   Alkaline Phosphatase 53 38 - 126 U/L   Total Bilirubin 0.4 0.3 - 1.2 mg/dL   GFR calc non Af Amer 41 (L) >60 mL/min   GFR calc Af Amer 48 (L) >60 mL/min   Anion gap 4 (L) 5 - 15  Ferritin  Result Value Ref Range   Ferritin 225 11 - 307 ng/mL  Iron and TIBC  Result Value Ref Range   Iron 70 28 - 170 ug/dL   TIBC 276 250 - 450 ug/dL   Saturation Ratios  25 10.4 - 31.8 %   UIBC 206 ug/dL   Complete Blood Count (Most recent): Lab Results  Component Value Date   WBC 5.5 06/07/2015   HGB 11.7* 06/07/2015   HCT 34.0* 06/07/2015   MCV 93.4 06/07/2015   PLT 213 06/07/2015   Chemistry (most recent): Lab Results  Component Value Date   NA 134* 06/07/2015   K 4.8 06/07/2015   CL 108 06/07/2015   CO2 22 06/07/2015   BUN 31* 06/07/2015   CREATININE 1.21* 06/07/2015   Diabetic Labs (most recent): No results found for: HGBA1C Lipid profile (most recent): Lab Results  Component Value Date   TRIG 235* 10/08/2014   CHOL 145 10/08/2014         Assessment & Plan:   1. Hormone disturbance I have reviewed her records and examined her physically. She does not have specific findings indicating adrenal dysfunction. Her random cortisol level was acceptable at 11 g. She would not need any intervention for now. I will plan to obtain- ACTH stimulation test and six-month with the office visit. 2. Hypothyroidism, unspecified hypothyroidism type She is clinically euthyroid. She is compliant with her levothyroxine. Her thyroid function tests were consistent with appropriate replacement. I advised her to continue pneumothorax in 50 g by mouth every morning.  - We discussed about correct intake of levothyroxine, at fasting, with water, separated by at least 30 minutes from breakfast, and separated by more than 4 hours from calcium, iron, multivitamins, acid reflux medications (PPIs). -Patient is made aware of the fact that thyroid hormone replacement is needed for life, dose to be adjusted by periodic monitoring of thyroid function tests. I advised patient to maintain close follow up with their PCP for primary care needs. Follow up plan: Return in about 6 months (around 01/17/2016) for follow up with pre-visit labs.  Glade Lloyd, MD Phone: [phone]  Fax: [phone]  07/19/2015, 8:28 PM

## 2015-07-30 ENCOUNTER — Other Ambulatory Visit: Payer: Self-pay | Admitting: Family Medicine

## 2015-08-06 ENCOUNTER — Encounter: Payer: Self-pay | Admitting: *Deleted

## 2015-08-15 DIAGNOSIS — N022 Recurrent and persistent hematuria with diffuse membranous glomerulonephritis: Secondary | ICD-10-CM | POA: Diagnosis not present

## 2015-08-18 ENCOUNTER — Other Ambulatory Visit: Payer: Self-pay | Admitting: Family Medicine

## 2015-08-20 ENCOUNTER — Ambulatory Visit (INDEPENDENT_AMBULATORY_CARE_PROVIDER_SITE_OTHER): Payer: Medicare Other | Admitting: Internal Medicine

## 2015-08-28 ENCOUNTER — Ambulatory Visit: Payer: Medicare Other | Admitting: Family Medicine

## 2015-09-02 ENCOUNTER — Ambulatory Visit (INDEPENDENT_AMBULATORY_CARE_PROVIDER_SITE_OTHER): Payer: Medicare Other | Admitting: Family Medicine

## 2015-09-02 ENCOUNTER — Other Ambulatory Visit: Payer: Self-pay | Admitting: Family Medicine

## 2015-09-02 ENCOUNTER — Encounter: Payer: Self-pay | Admitting: Family Medicine

## 2015-09-02 ENCOUNTER — Encounter: Payer: Self-pay | Admitting: *Deleted

## 2015-09-02 VITALS — BP 142/86 | Ht 69.0 in | Wt 156.6 lb

## 2015-09-02 DIAGNOSIS — F329 Major depressive disorder, single episode, unspecified: Secondary | ICD-10-CM | POA: Diagnosis not present

## 2015-09-02 DIAGNOSIS — F325 Major depressive disorder, single episode, in full remission: Secondary | ICD-10-CM | POA: Diagnosis not present

## 2015-09-02 DIAGNOSIS — E039 Hypothyroidism, unspecified: Secondary | ICD-10-CM | POA: Diagnosis not present

## 2015-09-02 DIAGNOSIS — G894 Chronic pain syndrome: Secondary | ICD-10-CM | POA: Diagnosis not present

## 2015-09-02 DIAGNOSIS — E785 Hyperlipidemia, unspecified: Secondary | ICD-10-CM

## 2015-09-02 DIAGNOSIS — F32A Depression, unspecified: Secondary | ICD-10-CM

## 2015-09-02 MED ORDER — AMLODIPINE BESYLATE 10 MG PO TABS: 10.0000 mg | ORAL_TABLET | Freq: Every day | ORAL | Status: DC

## 2015-09-02 MED ORDER — METOPROLOL SUCCINATE ER 50 MG PO TB24: ORAL_TABLET | ORAL | Status: DC

## 2015-09-02 MED ORDER — HYDROCODONE-ACETAMINOPHEN 5-325 MG PO TABS: 1.0000 | ORAL_TABLET | ORAL | Status: DC | PRN

## 2015-09-02 MED ORDER — ALPRAZOLAM 1 MG PO TABS: 1.0000 mg | ORAL_TABLET | Freq: Two times a day (BID) | ORAL | Status: DC | PRN

## 2015-09-02 MED ORDER — VENLAFAXINE HCL ER 75 MG PO CP24: ORAL_CAPSULE | ORAL | Status: DC

## 2015-09-02 MED ORDER — LISINOPRIL 40 MG PO TABS: 40.0000 mg | ORAL_TABLET | Freq: Every day | ORAL | Status: DC

## 2015-09-02 MED ORDER — PRAVASTATIN SODIUM 80 MG PO TABS: 80.0000 mg | ORAL_TABLET | Freq: Every day | ORAL | Status: DC

## 2015-09-02 NOTE — Progress Notes (Signed)
   Subjective:    Patient ID: Kelsey Sandoval, female    DOB: Jun 04, 1933, 81 y.o.   MRN: [id number]  Hypertension This is a chronic problem. The current episode started more than 1 year ago. Pertinent negatives include no chest pain. Risk factors: norvasc, lisinopril, metoprolol,   the patient is having a moderate amount of sadness related to the close friend who died we talked about this at length patient not depressed has justified grief Hyperlipidemia takes medication watch his diet closely Nephrotic syndrome avoids excessive protein follows with [facility] on a regular basis no complications currently Chronic pain and discomfort in her lumbar back as well as sciatica prescription 3 prescriptions given for pain medicine does not cause drowsiness patient takes it as advise Patient has depression in remission. Patient has use Xanax for a long span of time she denies a causing drowsiness she does a half for whole tablet twice a day as necessary when she is anxious    asReview of Systems  Constitutional: Negative for activity change, appetite change and fatigue.  HENT: Negative for congestion.   Respiratory: Negative for cough.   Cardiovascular: Negative for chest pain.  Gastrointestinal: Negative for abdominal pain.  Endocrine: Negative for polydipsia and polyphagia.  Neurological: Negative for weakness.  Psychiatric/Behavioral: Negative for confusion.       Objective:   Physical Exam  Constitutional: She appears well-nourished. No distress.  Cardiovascular: Normal rate, regular rhythm and normal heart sounds.   No murmur heard. Pulmonary/Chest: Effort normal and breath sounds normal. No respiratory distress.  Musculoskeletal: She exhibits no edema.  Lymphadenopathy:    She has no cervical adenopathy.  Neurological: She is alert. She exhibits normal muscle tone.  Psychiatric: Her behavior is normal.  Vitals reviewed.   25 minutes was spent with the patient. Greater than half the time  was spent in discussion and answering questions and counseling regarding the issues that the patient came in for today.       Assessment & Plan:  1. Hyperlipidemia Continue medication check lab work await results watch diet closely - Lipid panel  2. Hypothyroidism, unspecified hypothyroidism type Continue thyroid medicine. Intermittently we will need to check lab work will not need it currently  3. Depression See below  4. Chronic pain syndrome 3 prescription pain medicine given patient is compliant she denies abusing the medication The patient was seen today as part of a comprehensive visit regarding pain control. Patient's compliance with the medication as well as discussion regarding effectiveness was completed. Prescriptions were written. Patient was advised to follow-up in 3 months. The patient was assessed for any signs of severe side effects. The patient was advised to take the medicine as directed and to report to [place] if any side effect issues.   5. Major depression in remission [facility]) Continue medication recently went through the loss of a close friend she is grieving over this but her depression is in remission

## 2015-09-03 NOTE — Telephone Encounter (Signed)
May have this and 4 refills 

## 2015-09-06 ENCOUNTER — Encounter (HOSPITAL_COMMUNITY): Payer: Medicare Other

## 2015-09-10 ENCOUNTER — Encounter: Payer: Self-pay | Admitting: Family Medicine

## 2015-09-10 ENCOUNTER — Ambulatory Visit (INDEPENDENT_AMBULATORY_CARE_PROVIDER_SITE_OTHER): Payer: Medicare Other | Admitting: Family Medicine

## 2015-09-10 VITALS — BP 138/80 | Temp 98.4°F | Ht 69.0 in | Wt 152.0 lb

## 2015-09-10 DIAGNOSIS — J208 Acute bronchitis due to other specified organisms: Secondary | ICD-10-CM

## 2015-09-10 DIAGNOSIS — J019 Acute sinusitis, unspecified: Secondary | ICD-10-CM

## 2015-09-10 MED ORDER — PREDNISONE 20 MG PO TABS: ORAL_TABLET | ORAL | Status: DC

## 2015-09-10 MED ORDER — CEFPROZIL 500 MG PO TABS: 500.0000 mg | ORAL_TABLET | Freq: Two times a day (BID) | ORAL | Status: DC

## 2015-09-10 MED ORDER — HYDROCODONE-HOMATROPINE 5-1.5 MG/5ML PO SYRP: 5.0000 mL | ORAL_SOLUTION | Freq: Four times a day (QID) | ORAL | Status: DC | PRN

## 2015-09-10 NOTE — Progress Notes (Signed)
   Subjective:    Patient ID: Kelsey Sandoval, female    DOB: 1932-11-11, 81 y.o.   MRN: [id number]  Cough This is a new problem. Episode onset: 3 days ago. The cough is productive of sputum (green). Associated symptoms include ear pain, rhinorrhea, a sore throat and wheezing. Pertinent negatives include no chest pain, fever or shortness of breath. Treatments tried: mucinex. The treatment provided no relief.   Paced are off head congestion drainage coughing thence started having sinus pressure pain discomfort along with low-grade fever chest congestion denies wheezing currently but did have some wheezing earlier yesterday   Review of Systems  Constitutional: Negative for fever and activity change.  HENT: Positive for congestion, ear pain, rhinorrhea and sore throat.   Eyes: Negative for discharge.  Respiratory: Positive for cough and wheezing. Negative for shortness of breath.   Cardiovascular: Negative for chest pain.       Objective:   Physical Exam  Constitutional: She appears well-developed.  HENT:  Head: Normocephalic.  Nose: Nose normal.  Mouth/Throat: Oropharynx is clear and moist. No oropharyngeal exudate.  Neck: Neck supple.  Cardiovascular: Normal rate and normal heart sounds.   No murmur heard. Pulmonary/Chest: Effort normal and breath sounds normal. She has no wheezes.  Lymphadenopathy:    She has no cervical adenopathy.  Skin: Skin is warm and dry.  Nursing note and vitals reviewed.         Assessment & Plan:  Sinusitis Viral bronchitis Antibodies prescribed warning signs discussed No need for prednisone currently but if worse may well need to be on some prednisone. Patient to call back if worse

## 2015-09-19 ENCOUNTER — Other Ambulatory Visit (HOSPITAL_COMMUNITY)
Admission: RE | Admit: 2015-09-19 | Discharge: 2015-09-19 | Disposition: A | Discharge status: Home / Self Care | Payer: Medicare Other | Source: Ambulatory Visit | Attending: Family Medicine | Admitting: Family Medicine

## 2015-09-19 ENCOUNTER — Ambulatory Visit (INDEPENDENT_AMBULATORY_CARE_PROVIDER_SITE_OTHER): Payer: Medicare Other | Admitting: Family Medicine

## 2015-09-19 ENCOUNTER — Encounter: Payer: Self-pay | Admitting: Family Medicine

## 2015-09-19 ENCOUNTER — Telehealth: Payer: Self-pay | Admitting: *Deleted

## 2015-09-19 VITALS — BP 148/90 | Temp 98.0°F | Ht 69.0 in | Wt 155.2 lb

## 2015-09-19 DIAGNOSIS — R112 Nausea with vomiting, unspecified: Secondary | ICD-10-CM | POA: Diagnosis not present

## 2015-09-19 LAB — CBC WITH DIFFERENTIAL/PLATELET
Basophils Absolute: 0 10*3/uL (ref 0.0–0.1)
Basophils Relative: 0 %
EOS ABS: 0.1 10*3/uL (ref 0.0–0.7)
Eosinophils Relative: 1 %
HEMATOCRIT: 35.4 % — AB (ref 36.0–46.0)
HEMOGLOBIN: 12.1 g/dL (ref 12.0–15.0)
LYMPHS ABS: 1.6 10*3/uL (ref 0.7–4.0)
LYMPHS PCT: 16 %
MCH: 32.2 pg (ref 26.0–34.0)
MCHC: 34.2 g/dL (ref 30.0–36.0)
MCV: 94.1 fL (ref 78.0–100.0)
MONOS PCT: 5 %
Monocytes Absolute: 0.6 10*3/uL (ref 0.1–1.0)
NEUTROS ABS: 8.3 10*3/uL — AB (ref 1.7–7.7)
NEUTROS PCT: 78 %
Platelets: 260 10*3/uL (ref 150–400)
RBC: 3.76 MIL/uL — AB (ref 3.87–5.11)
RDW: 12.2 % (ref 11.5–15.5)
WBC: 10.5 10*3/uL (ref 4.0–10.5)

## 2015-09-19 LAB — BASIC METABOLIC PANEL
Anion gap: 7 (ref 5–15)
BUN: 22 mg/dL — AB (ref 6–20)
CHLORIDE: 107 mmol/L (ref 101–111)
CO2: 25 mmol/L (ref 22–32)
CREATININE: 1.25 mg/dL — AB (ref 0.44–1.00)
Calcium: 9.2 mg/dL (ref 8.9–10.3)
GFR calc Af Amer: 46 mL/min — ABNORMAL LOW (ref >60)
GFR calc non Af Amer: 40 mL/min — ABNORMAL LOW (ref >60)
Glucose, Bld: 105 mg/dL — ABNORMAL HIGH (ref 65–99)
POTASSIUM: 4.1 mmol/L (ref 3.5–5.1)
SODIUM: 139 mmol/L (ref 135–145)

## 2015-09-19 LAB — HEPATIC FUNCTION PANEL
ALBUMIN: 3.5 g/dL (ref 3.5–5.0)
ALK PHOS: 70 U/L (ref 38–126)
ALT: 18 U/L (ref 14–54)
AST: 26 U/L (ref 15–41)
BILIRUBIN INDIRECT: 0.3 mg/dL (ref 0.3–0.9)
BILIRUBIN TOTAL: 0.4 mg/dL (ref 0.3–1.2)
Bilirubin, Direct: 0.1 mg/dL (ref 0.1–0.5)
TOTAL PROTEIN: 6.1 g/dL — AB (ref 6.5–8.1)

## 2015-09-19 LAB — LIPASE, BLOOD: Lipase: 28 U/L (ref 11–51)

## 2015-09-19 MED ORDER — ONDANSETRON 8 MG PO TBDP: 8.0000 mg | ORAL_TABLET | Freq: Three times a day (TID) | ORAL | Status: DC | PRN

## 2015-09-19 NOTE — Telephone Encounter (Signed)
Discussed with pt

## 2015-09-19 NOTE — Progress Notes (Signed)
   Subjective:    Patient ID: Kelsey Sandoval, female    DOB: November 03, 1932, 81 y.o.   MRN: [id number]  Emesis  This is a new problem. The current episode started in the past 7 days. The problem occurs intermittently. The problem has been unchanged. The emesis has an appearance of stomach contents. There has been no fever. Associated symptoms comments: nausea. Treatments tried: zofran. The treatment provided no relief.    recently seen for  Sinusitis was placed on antibiotics. Currently now having symptoms of nausea and intermittent bloating of the abdomen. Denies bloody stools relates for upper respiratory illnesses symptoms have improved and gone away   Review of Systems  Gastrointestinal: Positive for vomiting.   denies any head congestion wheezing coughing denies chest pressure tightness fever chills denies mucousy stools     Objective:   Physical Exam  lungs clear heart regular abdomen soft no guarding rebound or tenderness   stat labs ordered 25 minutes was spent with the patient. Greater than half the time was spent in discussion and answering questions and counseling regarding the issues that the patient came in for today.      Assessment & Plan:   significant abdominal symptoms Zofran for nausea  lab work ordered to make sure her white count looks good make sure patient not he hydrated Bland diet Zofran  supportive measures with soft diet in clear liquids If bloody or mucousy stools call back If progressive troubles or worse go to ER

## 2015-09-19 NOTE — Telephone Encounter (Signed)
Pt seen by dr Nicki Reaper this am. Office note is done. Stat bw results are available. Dr Nicki Reaper wanted you to look at.

## 2015-09-19 NOTE — Telephone Encounter (Signed)
Disc with wendy

## 2015-09-19 NOTE — Telephone Encounter (Signed)
No dehydration, no inflammation of the pancreas, normal wbc per dr Richardson Landry.

## 2015-10-01 ENCOUNTER — Encounter: Payer: Self-pay | Admitting: *Deleted

## 2015-10-31 ENCOUNTER — Other Ambulatory Visit: Payer: Self-pay | Admitting: Family Medicine

## 2015-11-04 DIAGNOSIS — Z8679 Personal history of other diseases of the circulatory system: Secondary | ICD-10-CM | POA: Diagnosis not present

## 2015-11-04 DIAGNOSIS — M25562 Pain in left knee: Secondary | ICD-10-CM | POA: Diagnosis not present

## 2015-11-04 DIAGNOSIS — M25561 Pain in right knee: Secondary | ICD-10-CM | POA: Diagnosis not present

## 2015-11-04 DIAGNOSIS — Z6824 Body mass index (BMI) 24.0-24.9, adult: Secondary | ICD-10-CM | POA: Diagnosis not present

## 2015-11-04 DIAGNOSIS — M1712 Unilateral primary osteoarthritis, left knee: Secondary | ICD-10-CM | POA: Diagnosis not present

## 2015-11-04 DIAGNOSIS — I10 Essential (primary) hypertension: Secondary | ICD-10-CM | POA: Diagnosis not present

## 2015-11-06 ENCOUNTER — Encounter: Payer: Self-pay | Admitting: *Deleted

## 2015-11-25 DIAGNOSIS — E785 Hyperlipidemia, unspecified: Secondary | ICD-10-CM | POA: Diagnosis not present

## 2015-11-26 LAB — LIPID PANEL
CHOLESTEROL TOTAL: 187 mg/dL (ref 100–199)
Chol/HDL Ratio: 3.2 ratio units (ref 0.0–4.4)
HDL: 58 mg/dL (ref >39)
LDL Calculated: 87 mg/dL (ref 0–99)
Triglycerides: 209 mg/dL — ABNORMAL HIGH (ref 0–149)
VLDL Cholesterol Cal: 42 mg/dL — ABNORMAL HIGH (ref 5–40)

## 2015-12-04 ENCOUNTER — Encounter: Payer: Self-pay | Admitting: Family Medicine

## 2015-12-04 ENCOUNTER — Ambulatory Visit (INDEPENDENT_AMBULATORY_CARE_PROVIDER_SITE_OTHER): Payer: Medicare Other | Admitting: Family Medicine

## 2015-12-04 VITALS — BP 136/86 | Ht 70.0 in | Wt 157.0 lb

## 2015-12-04 DIAGNOSIS — F332 Major depressive disorder, recurrent severe without psychotic features: Secondary | ICD-10-CM | POA: Diagnosis not present

## 2015-12-04 DIAGNOSIS — M8580 Other specified disorders of bone density and structure, unspecified site: Secondary | ICD-10-CM

## 2015-12-04 DIAGNOSIS — F329 Major depressive disorder, single episode, unspecified: Secondary | ICD-10-CM | POA: Insufficient documentation

## 2015-12-04 DIAGNOSIS — G894 Chronic pain syndrome: Secondary | ICD-10-CM

## 2015-12-04 DIAGNOSIS — Z780 Asymptomatic menopausal state: Secondary | ICD-10-CM

## 2015-12-04 DIAGNOSIS — N049 Nephrotic syndrome with unspecified morphologic changes: Secondary | ICD-10-CM

## 2015-12-04 DIAGNOSIS — I480 Paroxysmal atrial fibrillation: Secondary | ICD-10-CM | POA: Diagnosis not present

## 2015-12-04 DIAGNOSIS — E039 Hypothyroidism, unspecified: Secondary | ICD-10-CM | POA: Diagnosis not present

## 2015-12-04 MED ORDER — HYDROCODONE-ACETAMINOPHEN 5-325 MG PO TABS: 1.0000 | ORAL_TABLET | ORAL | Status: DC | PRN

## 2015-12-04 MED ORDER — SERTRALINE HCL 50 MG PO TABS: 50.0000 mg | ORAL_TABLET | Freq: Every day | ORAL | Status: DC

## 2015-12-04 NOTE — Progress Notes (Signed)
Kelsey Lange, MD [place] [address]  Anemia, iron deficiency - Plan: CBC with Differential, Iron and TIBC, Ferritin  Membranous nephropathy determined by biopsy  CURRENT THERAPY: IV iron replacement  INTERVAL HISTORY: Kelsey Sandoval 82 y.o. female returns for followup of iron deficiency anemia with intolerance to PO iron in the setting of Stage III renal disease.  I personally reviewed and went over laboratory results with the patient.  The results are noted within this dictation.  Labs are being updated today.  Chart is reviewed.  She saw Dr. Wolfgang Phoenix yesterday and his note is reviewed.    [name] sees a nephrologist at [facility], Dr. Leslee Home for membranous nephropathy.  She saw him last on 08/16/2015 and portions of his note from [place] follows:  ASSESSMENT/PLAN: Kelsey Sandoval is being seen in clinic for the following:  1. MN; 1st biopsy in remote past, achieved remission after steroid. 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  -uPCR 5.4 in Aug 2016. Then, today uPCR is 8.1 -plan to check lab including Anti-PLA2R Ab and consider re-dose of Rituximab.  2. NS -She takes Pradaxa for her AF. On Pravachol.  3. Hypertension, AF s/p pacemaker -on Lisinopril 40mg /d, Toprol 100mg /d, and Flecainide.  Case discussed with Dr.Nachman. Kelsey Sandoval will follow up in 3 months.    Unfortunately, the inner wall in her basement has collapsed and therefore, she had to call a company to provide bracing to support her house until she is able to determine if insurance will help cover the repair, otherwise, she is unsure how she can afford the fix.  She reports that her insurance paid about $27,000 towards this bill but she had to come up with approximately $12,000. This is cause a tremendous amount of stress in her  life.  Additionally, she has reactive depression. Her significant other of 40+ years passed away not too long ago. This is cause a tremendous amount of depression her life. Now that she is by herself, she finds that she cries at home. She is seen her primary care physician who started her on Zoloft. I've encouraged compliance with his medication and educated her that it does not work immediately. She has to continue to take this medication daily to see any benefit. Additionally, she has family members are are losing their jobs were not paid well and therefore she feels the stress related to this.  Upon my entrance into the examination room she says "I don't have to come back anymore right?" She really wants to be discharged from our clinic, but at this time is not appropriate. She admits that she will not return in 3 months for laboratory check. As a result, we've scheduled a six-month lab check in addition to a six-month follow-up appointment. I'm fearful that she won't even show up for this appointment. She has directed to follow-up with her primary care physician as directed.  She does admit that if she starts feeling poor, she'll come in sooner for laboratory work.  Past Medical History  Diagnosis Date  . Hypertension   . Dysrhythmia   . Anxiety   . Depression   . Pacemaker   . Arthritis   . Hyperlipidemia   . Pre-diabetes   . Osteopenia   . Atrial fibrillation ([place])   . Back  pain, chronic   . Cataracts, bilateral   . Urine protein increased   . Nephrotic syndrome     RHUX  . Membranous nephropathy determined by biopsy 06/07/2015    has Paroxysmal atrial fibrillation ([place]); Tachycardia-bradycardia syndrome ([place]); Sick sinus syndrome ([place]); Systemic hypertension; Dyslipidemia; Mitral insufficiency; Pacemaker; Anemia, iron deficiency; Proteinuria; Osteopenia; Nephrotic syndrome; Hypothyroidism; UTI (lower urinary tract infection); Nausea & vomiting; Malnutrition of moderate degree ([place]);  CAP (community acquired pneumonia); Nausea with vomiting; Intractable nausea and vomiting; Disorder of kidney; Osteoarthritis of both knees; Chronic pain syndrome; Membranous nephropathy determined by biopsy; Major depression in remission ([place]); and Major depression ([place]) on her problem list.     is allergic to penicillins; levaquin; sulfa antibiotics; and zithromax.  Current Outpatient Prescriptions on File Prior to Visit  Medication Sig Dispense Refill  . albuterol (PROVENTIL HFA;VENTOLIN HFA) 108 (90 BASE) MCG/ACT inhaler Inhale 2 puffs into the lungs every 6 (six) hours as needed for wheezing or shortness of breath. 18 g 5  . ALPRAZolam (XANAX) 1 MG tablet TAKE ONE TABLET BY MOUTH TWICE DAILY AS NEEDED FOR ANXIETY 60 tablet 4  . amLODipine (NORVASC) 10 MG tablet TAKE ONE TABLET BY MOUTH ONCE DAILY 30 tablet 3  . dabigatran (PRADAXA) 150 MG CAPS capsule Take 1 capsule (150 mg total) by mouth 2 (two) times daily. 180 capsule 6  . famotidine (PEPCID) 20 MG tablet Take 1 tablet (20 mg total) by mouth 2 (two) times daily. 60 tablet 1  . flecainide (TAMBOCOR) 50 MG tablet TAKE ONE TABLET BY MOUTH TWICE DAILY 180 tablet 3  . gabapentin (NEURONTIN) 100 MG capsule Take 2 capsules (200 mg total) by mouth 2 (two) times daily. 120 capsule 5  . HYDROcodone-acetaminophen (NORCO/VICODIN) 5-325 MG tablet Take 1 tablet by mouth every 4 (four) hours as needed for moderate pain. 120 tablet 0  . levothyroxine (SYNTHROID, LEVOTHROID) 50 MCG tablet TAKE ONE TABLET BY MOUTH ONCE DAILY **CHANGE  IN  DOSE** 30 tablet 5  . lisinopril (PRINIVIL,ZESTRIL) 40 MG tablet Take 1 tablet (40 mg total) by mouth daily. 30 tablet 12  . metoprolol succinate (TOPROL-XL) 50 MG 24 hr tablet TAKE ONE TABLET BY MOUTH ONCE DAILY WITH  OR  IMMEDIATELY  FOLLOWING  A  MEAL 30 tablet 12  . ondansetron (ZOFRAN-ODT) 8 MG disintegrating tablet Take 1 tablet (8 mg total) by mouth every 8 (eight) hours as needed for nausea or vomiting. 30 tablet 3    . pravastatin (PRAVACHOL) 80 MG tablet Take 1 tablet (80 mg total) by mouth daily. 30 tablet 12  . riTUXimab in sodium chloride 0.9 % 250 mL Infusion 2 weeks apart: May repeat in 6 months. Follow up with primary care doctor prior to initiation of rituximab for infectious clearance  0  . sertraline (ZOLOFT) 50 MG tablet Take 1 tablet (50 mg total) by mouth daily. 30 tablet 5  . vitamin B-12 (CYANOCOBALAMIN) 1000 MCG tablet Take 1 tablet (1,000 mcg total) by mouth daily.     No current facility-administered medications on file prior to visit.    Past Surgical History  Procedure Laterality Date  . Insert / replace / remove pacemaker    . Appendectomy    . Abdominal hysterectomy      partial-pt has no ovaries  . Back surgery    . Colonoscopy  9/05  . Dual chamber pacemaker      2012  . Colonoscopy N/A 10/11/2013    Procedure: COLONOSCOPY;  Surgeon: Rogene Houston,  MD;  Location: [facility];  Service: Endoscopy;  Laterality: N/A;  1200  . Esophagogastroduodenoscopy N/A 01/17/2014    Procedure: ESOPHAGOGASTRODUODENOSCOPY (EGD);  Surgeon: Rogene Houston, MD;  Location: [facility];  Service: Endoscopy;  Laterality: N/A;  230  . Biopsy N/A 01/17/2014    Procedure: BIOPSY;  Surgeon: Rogene Houston, MD;  Location: [facility];  Service: Endoscopy;  Laterality: N/A;  . Wisdom tooth extracton    . Cataract extraction w/phaco Left 02/28/2015    Procedure: CATARACT EXTRACTION PHACO AND INTRAOCULAR LENS PLACEMENT (IOC);  Surgeon: [name], MD;  Location: [facility] ORS;  Service: Ophthalmology;  Laterality: Left;  CDE 18.71    Denies any headaches, dizziness, double vision, fevers, chills, night sweats, nausea, vomiting, diarrhea, constipation, chest pain, heart palpitations, shortness of breath, blood in stool, black tarry stool, urinary pain, urinary burning, urinary frequency, hematuria.   PHYSICAL EXAMINATION  ECOG PERFORMANCE STATUS: 1 - Symptomatic but completely ambulatory  Filed Vitals:    12/05/15 1250  BP: 180/87  Pulse: 66  Temp: 97.8 F (36.6 C)  Resp: 18    GENERAL:alert, no distress, well nourished, well developed, comfortable, cooperative and smiling but saddened, unaccompanied SKIN: skin color, texture, turgor are normal, no rashes or significant lesions HEAD: Normocephalic, No masses, lesions, tenderness or abnormalities EYES: normal, PERRLA, EOMI EARS: External ears normal OROPHARYNX:lips, buccal mucosa, and tongue normal and mucous membranes are moist  NECK: supple, trachea midline LYMPH:  not examined BREAST:not examined LUNGS: clear to auscultation Without wheezes, rales, rhonchi HEART: regular rate & rhythm without murmur, rub, or gallop. ABDOMEN:abdomen soft and normal bowel sounds BACK: Back symmetric, no curvature., No CVA tenderness EXTREMITIES:less then 2 second capillary refill, no joint deformities, effusion, or inflammation, no skin discoloration, no cyanosis  NEURO: alert & oriented x 3 with fluent speech, no focal motor/sensory deficits, gait normal    LABORATORY DATA: CBC    Component Value Date/Time   WBC 7.4 12/05/2015 1208   WBC 7.5 05/16/2015 1209   RBC 3.68* 12/05/2015 1208   RBC 3.87 05/16/2015 1209   HGB 12.0 12/05/2015 1208   HCT 34.5* 12/05/2015 1208   HCT 36.8 05/16/2015 1209   PLT 206 12/05/2015 1208   PLT 197 05/16/2015 1209   MCV 93.8 12/05/2015 1208   MCV 95 05/16/2015 1209   MCH 32.6 12/05/2015 1208   MCH 32.0 05/16/2015 1209   MCHC 34.8 12/05/2015 1208   MCHC 33.7 05/16/2015 1209   RDW 12.5 12/05/2015 1208   RDW 13.2 05/16/2015 1209   LYMPHSABS 1.4 12/05/2015 1208   LYMPHSABS 1.4 05/16/2015 1209   MONOABS 0.3 12/05/2015 1208   EOSABS 0.0 12/05/2015 1208   EOSABS 0.1 05/16/2015 1209   BASOSABS 0.0 12/05/2015 1208   BASOSABS 0.0 05/16/2015 1209      Chemistry      Component Value Date/Time   NA 140 12/05/2015 1208   NA 140 05/16/2015 1209   K 4.6 12/05/2015 1208   CL 108 12/05/2015 1208   CO2 26  12/05/2015 1208   BUN 28* 12/05/2015 1208   BUN 31* 05/16/2015 1209   CREATININE 1.16* 12/05/2015 1208   CREATININE 1.24* 03/01/2014 1017   CREATININE 1.24* 02/27/2014 1008      Component Value Date/Time   CALCIUM 8.7* 12/05/2015 1208   ALKPHOS 60 12/05/2015 1208   AST 23 12/05/2015 1208   ALT 15 12/05/2015 1208   BILITOT 0.7 12/05/2015 1208   BILITOT 0.2 02/13/2015 1512  Lab Results  Component Value Date   IRON 70 06/07/2015   TIBC 276 06/07/2015   FERRITIN 225 06/07/2015    PENDING LABS:   RADIOGRAPHIC STUDIES:  No results found.   PATHOLOGY:    ASSESSMENT AND PLAN:  Anemia, iron deficiency Iron deficiency anemia with intolerance to PO iron in the setting of Stage III renal disease requiring IV iron replacement.  Oncology Flowsheet 11/15/2014 11/22/2014  ferumoxytol (FERAHEME) IV 510 mg 510 mg    Since receiving IV Feraheme, Hgb has been nearly or WNL.  Labs today: CBC diff, Iron/TIBC, ferritin.  Hemoglobin today is officially within normal limits. His 12.0 g/dL. Relatively stable over the past 3 months or so. Her iron studies are pending today.  She admits that she will not return in 3 months for repeat laboratory check. As a result we had a discussion regarding her future follow-up. I'll get her scheduled for repeat labs in 6 months with a follow-up appointment at that time. She knows she can call and have laboratory work done at any time. I question whether she'll even show up in 6 months. I've encouraged compliance and our role in her hematologic care. Unfortunately, she has a lot of issues going on at home including the recent loss of her significant other of 40+ years. She also has some family issues that are ongoing. She seen her primary care physician yesterday and he is altered somewhat of her medications including using an SSRI, Zoloft.  Patient cancelled her December 2016 lab appointment secondary to a death in her family.  Return in 6  months.  Given the patient's depression, we had a long face-to-face discussion. She had a few frustrations that she had to get off her chest and I provided her in arena in which she could vent. We spent 25 minutes in face-to-face discussion.  Membranous nephropathy determined by biopsy Followed by Dr. Leslee Home at [facility] and having received Rituxan infusions on 02/20/15 and 04/02/2015.    THERAPY PLAN:  We will continue to monitor labs and maintain iron stores.  All questions were answered. The patient knows to call the clinic with any problems, questions or concerns. We can certainly see the patient much sooner if necessary.  Patient and plan discussed with Dr. Ancil Linsey and she is in agreement with the aforementioned.   This note is electronically signed by: Doy Mince 12/05/2015 1:45 PM

## 2015-12-04 NOTE — Assessment & Plan Note (Addendum)
Iron deficiency anemia with intolerance to PO iron in the setting of Stage III renal disease requiring IV iron replacement.  Oncology Flowsheet 11/15/2014 11/22/2014  ferumoxytol (FERAHEME) IV 510 mg 510 mg    Since receiving IV Feraheme, Hgb has been nearly or WNL.  Labs today: CBC diff, Iron/TIBC, ferritin.  Hemoglobin today is officially within normal limits. His 12.0 g/dL. Relatively stable over the past 3 months or so. Her iron studies are pending today.  She admits that she will not return in 3 months for repeat laboratory check. As a result we had a discussion regarding her future follow-up. I'll get her scheduled for repeat labs in 6 months with a follow-up appointment at that time. She knows she can call and have laboratory work done at any time. I question whether she'll even show up in 6 months. I've encouraged compliance and our role in her hematologic care. Unfortunately, she has a lot of issues going on at home including the recent loss of her significant other of 40+ years. She also has some family issues that are ongoing. She seen her primary care physician yesterday and he is altered somewhat of her medications including using an SSRI, Zoloft.  Patient cancelled her December 2016 lab appointment secondary to a death in her family.  Return in 6 months.  Given the patient's depression, we had a long face-to-face discussion. She had a few frustrations that she had to get off her chest and I provided her in arena in which she could vent. We spent 25 minutes in face-to-face discussion.

## 2015-12-04 NOTE — Progress Notes (Signed)
Subjective:    Patient ID: Kelsey Sandoval, female    DOB: [birth year], 82 y.o.   MRN: [id number]  HPI This patient was seen today for chronic pain.   Takes forSciatica pain and knee pain  The medication list was reviewed and updated.   -Compliance with medication: yes  - Number patient states they take daily:  2 - 3 a day  -when was the last dose patient took? today  The patient was advised the importance of maintaining medication and not using illegal substances with these.  Refills needed: yes  The patient was educated that we can provide 3 monthly scripts for their medication, it is their responsibility to follow the instructions.  Side effects or complications from medications: none  Patient is aware that pain medications are meant to minimize the severity of the pain to allow their pain levels to improve to allow for better function. They are aware of that pain medications cannot totally remove their pain.  Due for UDT ( at least once per year) : June 2016  Pt states one of her meds is causing her to have night mares and night sweats. Started having after her boyfriend of 40 years passed away.   Also feeling nervous a lot. Takes xanax.   Nausea in the mornings. Takes zofran.  Patient with history of atrial fib under good control taking anticoagulant to prevent strokes no issues of bleeding Nephrotic syndrome see specialist periodically blood pressure today good control no swelling tries watch diet Major depression reoccurring Effexor not helping open to trying other medicines not open for counseling denies being suicidal Chronic lumbar pain back pain pain medicine helps takes anywhere from 2-4 times per day Thyroid tolerates medication feels things are going well due for lab work     Review of Systems  Constitutional: Negative for activity change, appetite change and fatigue.  HENT: Negative for congestion.   Respiratory: Negative for cough.   Cardiovascular:  Negative for chest pain.  Gastrointestinal: Negative for abdominal pain.  Endocrine: Negative for polydipsia and polyphagia.  Neurological: Negative for weakness.  Psychiatric/Behavioral: Negative for confusion.       Objective:   Physical Exam  Constitutional: She appears well-nourished. No distress.  Cardiovascular: Normal rate, regular rhythm and normal heart sounds.   No murmur heard. Pulmonary/Chest: Effort normal and breath sounds normal. No respiratory distress.  Musculoskeletal: She exhibits no edema.  Lymphadenopathy:    She has no cervical adenopathy.  Neurological: She is alert. She exhibits normal muscle tone.  Psychiatric: Her behavior is normal.  Vitals reviewed.         Assessment & Plan:  1. Hypothyroidism, unspecified hypothyroidism type Thyroid good control continue current measures. Check lab work this spring - TSH  2. Osteopenia Patient due for bone density - DG Bone Density  3. Post-menopausal See above - DG Bone Density  4. Paroxysmal atrial fibrillation (H[facility]) Heart rate controlled currently tolerating anticoagulant no bleeding issues  5. Nephrotic syndrome Blood pressure good control no swelling in the legs  6. Severe episode of recurrent major depressive disorder, without psychotic features ([place]) Stop Effexor use Zoloft patient had significant stress related heard death of her friend back in 2023-09-26 patient not suicidal does not want to do counseling currently  7. Chronic pain syndrome Pain prescriptions refilled The patient was seen today as part of a comprehensive visit regarding pain control. Patient's compliance with the medication as well as discussion regarding effectiveness was completed. Prescriptions were written. Patient  was advised to follow-up in 3 months. The patient was assessed for any signs of severe side effects. The patient was advised to take the medicine as directed and to report to [place] if any side effect  issues.   Refills on her other chronic medicines including Xanax patient states Xanax does not cause drowsiness help with anxiety

## 2015-12-04 NOTE — Assessment & Plan Note (Signed)
Followed by Dr. Leslee Home at [facility] and having received Rituxan infusions on 02/20/15 and 04/02/2015.

## 2015-12-05 ENCOUNTER — Encounter (HOSPITAL_COMMUNITY): Payer: Medicare Other | Attending: Hematology & Oncology

## 2015-12-05 ENCOUNTER — Encounter (HOSPITAL_BASED_OUTPATIENT_CLINIC_OR_DEPARTMENT_OTHER): Payer: Medicare Other | Admitting: Oncology

## 2015-12-05 ENCOUNTER — Ambulatory Visit (HOSPITAL_COMMUNITY): Payer: Medicare Other | Admitting: Hematology & Oncology

## 2015-12-05 ENCOUNTER — Encounter (HOSPITAL_COMMUNITY): Payer: Self-pay | Admitting: Oncology

## 2015-12-05 VITALS — BP 180/87 | HR 66 | Temp 97.8°F | Resp 18 | Wt 157.0 lb

## 2015-12-05 DIAGNOSIS — D509 Iron deficiency anemia, unspecified: Secondary | ICD-10-CM | POA: Insufficient documentation

## 2015-12-05 DIAGNOSIS — N183 Chronic kidney disease, stage 3 (moderate): Secondary | ICD-10-CM

## 2015-12-05 DIAGNOSIS — N022 Recurrent and persistent hematuria with diffuse membranous glomerulonephritis: Secondary | ICD-10-CM

## 2015-12-05 LAB — COMPREHENSIVE METABOLIC PANEL
ALT: 15 U/L (ref 14–54)
AST: 23 U/L (ref 15–41)
Albumin: 3.5 g/dL (ref 3.5–5.0)
Alkaline Phosphatase: 60 U/L (ref 38–126)
Anion gap: 6 (ref 5–15)
BILIRUBIN TOTAL: 0.7 mg/dL (ref 0.3–1.2)
BUN: 28 mg/dL — AB (ref 6–20)
CO2: 26 mmol/L (ref 22–32)
CREATININE: 1.16 mg/dL — AB (ref 0.44–1.00)
Calcium: 8.7 mg/dL — ABNORMAL LOW (ref 8.9–10.3)
Chloride: 108 mmol/L (ref 101–111)
GFR, EST AFRICAN AMERICAN: 50 mL/min — AB (ref >60)
GFR, EST NON AFRICAN AMERICAN: 43 mL/min — AB (ref >60)
Glucose, Bld: 106 mg/dL — ABNORMAL HIGH (ref 65–99)
POTASSIUM: 4.6 mmol/L (ref 3.5–5.1)
Sodium: 140 mmol/L (ref 135–145)
TOTAL PROTEIN: 5.9 g/dL — AB (ref 6.5–8.1)

## 2015-12-05 LAB — CBC WITH DIFFERENTIAL/PLATELET
BASOS ABS: 0 10*3/uL (ref 0.0–0.1)
Basophils Relative: 0 %
EOS PCT: 0 %
Eosinophils Absolute: 0 10*3/uL (ref 0.0–0.7)
HEMATOCRIT: 34.5 % — AB (ref 36.0–46.0)
Hemoglobin: 12 g/dL (ref 12.0–15.0)
LYMPHS ABS: 1.4 10*3/uL (ref 0.7–4.0)
LYMPHS PCT: 19 %
MCH: 32.6 pg (ref 26.0–34.0)
MCHC: 34.8 g/dL (ref 30.0–36.0)
MCV: 93.8 fL (ref 78.0–100.0)
MONO ABS: 0.3 10*3/uL (ref 0.1–1.0)
MONOS PCT: 4 %
NEUTROS ABS: 5.6 10*3/uL (ref 1.7–7.7)
Neutrophils Relative %: 77 %
Platelets: 206 10*3/uL (ref 150–400)
RBC: 3.68 MIL/uL — ABNORMAL LOW (ref 3.87–5.11)
RDW: 12.5 % (ref 11.5–15.5)
WBC: 7.4 10*3/uL (ref 4.0–10.5)

## 2015-12-05 LAB — IRON AND TIBC
Iron: 99 ug/dL (ref 28–170)
SATURATION RATIOS: 34 % — AB (ref 10.4–31.8)
TIBC: 295 ug/dL (ref 250–450)
UIBC: 196 ug/dL

## 2015-12-05 LAB — FERRITIN: FERRITIN: 109 ng/mL (ref 11–307)

## 2015-12-05 NOTE — Addendum Note (Signed)
Addended by: Joie Bimler on: 12/05/2015 01:52 PM   Modules accepted: Medications

## 2015-12-05 NOTE — Patient Instructions (Signed)
[place] at [facility]  Discharge Instructions:  We will repeat labs in 6 months. Return appointment in 6 months. Follow-up with Dr. Wolfgang Phoenix as directed. We are sorry to learn of your loss and we hope that the change in some of your medications will be helpful. Please call if you feel as though you need your labs checked sooner than scheduled. _______________________________________________________________  Thank you for choosing [place] at [facility] to provide your oncology and hematology care.  To afford each patient quality time with our providers, please arrive at least 15 minutes before your scheduled appointment.  You need to re-schedule your appointment if you arrive 10 or more minutes late.  We strive to give you quality time with our providers, and arriving late affects you and other patients whose appointments are after yours.  Also, if you no show three or more times for appointments you may be dismissed from the clinic.  Again, thank you for choosing [place] at [place] hope is that these requests will allow you access to exceptional care and in a timely manner. _______________________________________________________________  If you have questions after your visit, please contact our office at [phone] between the hours of 8:30 a.m. and 5:00 p.m. Voicemails left after 4:30 p.m. will not be returned until the following business day. _______________________________________________________________  For prescription refill requests, have your pharmacy contact our office. _______________________________________________________________  Recommendations made by the consultant and any test results will be sent to your referring physician. _______________________________________________________________

## 2015-12-10 ENCOUNTER — Emergency Department (HOSPITAL_COMMUNITY)
Admission: EM | Admit: 2015-12-10 | Discharge: 2015-12-10 | Disposition: A | Discharge status: Home / Self Care | Payer: Medicare Other | Attending: Emergency Medicine | Admitting: Emergency Medicine

## 2015-12-10 ENCOUNTER — Telehealth: Payer: Self-pay | Admitting: Family Medicine

## 2015-12-10 ENCOUNTER — Emergency Department (HOSPITAL_COMMUNITY): Payer: Medicare Other

## 2015-12-10 ENCOUNTER — Encounter (HOSPITAL_COMMUNITY): Payer: Self-pay | Admitting: Emergency Medicine

## 2015-12-10 DIAGNOSIS — I4891 Unspecified atrial fibrillation: Secondary | ICD-10-CM | POA: Diagnosis not present

## 2015-12-10 DIAGNOSIS — I10 Essential (primary) hypertension: Secondary | ICD-10-CM | POA: Insufficient documentation

## 2015-12-10 DIAGNOSIS — F329 Major depressive disorder, single episode, unspecified: Secondary | ICD-10-CM | POA: Diagnosis not present

## 2015-12-10 DIAGNOSIS — R1013 Epigastric pain: Secondary | ICD-10-CM | POA: Diagnosis not present

## 2015-12-10 DIAGNOSIS — Z950 Presence of cardiac pacemaker: Secondary | ICD-10-CM | POA: Insufficient documentation

## 2015-12-10 DIAGNOSIS — Z79899 Other long term (current) drug therapy: Secondary | ICD-10-CM | POA: Diagnosis not present

## 2015-12-10 DIAGNOSIS — R0789 Other chest pain: Secondary | ICD-10-CM | POA: Diagnosis not present

## 2015-12-10 DIAGNOSIS — E785 Hyperlipidemia, unspecified: Secondary | ICD-10-CM | POA: Insufficient documentation

## 2015-12-10 DIAGNOSIS — R531 Weakness: Secondary | ICD-10-CM | POA: Insufficient documentation

## 2015-12-10 DIAGNOSIS — Z7901 Long term (current) use of anticoagulants: Secondary | ICD-10-CM | POA: Diagnosis not present

## 2015-12-10 DIAGNOSIS — R079 Chest pain, unspecified: Secondary | ICD-10-CM

## 2015-12-10 DIAGNOSIS — J449 Chronic obstructive pulmonary disease, unspecified: Secondary | ICD-10-CM | POA: Diagnosis not present

## 2015-12-10 LAB — TSH: TSH: 1.482 u[IU]/mL (ref 0.350–4.500)

## 2015-12-10 LAB — CBC WITH DIFFERENTIAL/PLATELET
Basophils Absolute: 0 10*3/uL (ref 0.0–0.1)
Basophils Relative: 0 %
Eosinophils Absolute: 0 10*3/uL (ref 0.0–0.7)
Eosinophils Relative: 0 %
HCT: 36.1 % (ref 36.0–46.0)
Hemoglobin: 12.6 g/dL (ref 12.0–15.0)
Lymphocytes Relative: 16 %
Lymphs Abs: 1.6 10*3/uL (ref 0.7–4.0)
MCH: 32.6 pg (ref 26.0–34.0)
MCHC: 34.9 g/dL (ref 30.0–36.0)
MCV: 93.3 fL (ref 78.0–100.0)
Monocytes Absolute: 0.5 10*3/uL (ref 0.1–1.0)
Monocytes Relative: 5 %
Neutro Abs: 7.6 10*3/uL (ref 1.7–7.7)
Neutrophils Relative %: 79 %
Platelets: 193 10*3/uL (ref 150–400)
RBC: 3.87 MIL/uL (ref 3.87–5.11)
RDW: 12.6 % (ref 11.5–15.5)
WBC: 9.8 10*3/uL (ref 4.0–10.5)

## 2015-12-10 LAB — BASIC METABOLIC PANEL
Anion gap: 5 (ref 5–15)
BUN: 25 mg/dL — ABNORMAL HIGH (ref 6–20)
CO2: 27 mmol/L (ref 22–32)
Calcium: 9.1 mg/dL (ref 8.9–10.3)
Chloride: 108 mmol/L (ref 101–111)
Creatinine, Ser: 1.08 mg/dL — ABNORMAL HIGH (ref 0.44–1.00)
GFR calc Af Amer: 55 mL/min — ABNORMAL LOW (ref >60)
GFR calc non Af Amer: 47 mL/min — ABNORMAL LOW (ref >60)
Glucose, Bld: 103 mg/dL — ABNORMAL HIGH (ref 65–99)
Potassium: 4.4 mmol/L (ref 3.5–5.1)
Sodium: 140 mmol/L (ref 135–145)

## 2015-12-10 LAB — TROPONIN I: Troponin I: <0.03 ng/mL (ref <0.031)

## 2015-12-10 MED ORDER — PANTOPRAZOLE SODIUM 20 MG PO TBEC: 20.0000 mg | DELAYED_RELEASE_TABLET | Freq: Two times a day (BID) | ORAL | Status: DC

## 2015-12-10 NOTE — Telephone Encounter (Signed)
#  1 I would recommend sending in protonix 40 mg, 1 daily, #30, 5 refills. #2 patient may supplement this with 2 tablespoons of Maalox every 6 hours as needed #3 I would recommend the patient have a follow-up office visit next week for a recheck

## 2015-12-10 NOTE — ED Notes (Signed)
Dr. Kohut at bedside 

## 2015-12-10 NOTE — ED Notes (Signed)
Assumed care of patient from [place], [place]. Pt resting quietly at this time. No distress. VSS. Awaiting disposition/results.

## 2015-12-10 NOTE — Telephone Encounter (Signed)
Patient was seen in the ER today because she was having chest pains.  The ER doc told her he thought it was acid reflux and prescribed her pantoprazole (PROTONIX) 20 MG tablet 2 times daily.  Her insurance only covers 1 time daily for this and her daughter wants to know if we would be willing to send in a prescription for this for just 1 time daily?  [facility] [place]

## 2015-12-10 NOTE — ED Notes (Signed)
Pt states she has been having pain in the center of her chest for the past few days.  States that it started feeling like indigestion, but now feels like a lump right above epigastric area.  Hurts to swallow and pain spreads across lower chest.

## 2015-12-10 NOTE — Discharge Instructions (Signed)
Nonspecific Chest Pain  °Chest pain can be caused by many different conditions. There is always a chance that your pain could be related to something serious, such as a heart attack or a blood clot in your lungs. Chest pain can also be caused by conditions that are not life-threatening. If you have chest pain, it is very important to follow up with your health care provider. °CAUSES  °Chest pain can be caused by: °· Heartburn. °· Pneumonia or bronchitis. °· Anxiety or stress. °· Inflammation around your heart (pericarditis) or lung (pleuritis or pleurisy). °· A blood clot in your lung. °· A collapsed lung (pneumothorax). It can develop suddenly on its own (spontaneous pneumothorax) or from trauma to the chest. °· Shingles infection (varicella-zoster virus). °· Heart attack. °· Damage to the bones, muscles, and cartilage that make up your chest wall. This can include: °¨ Bruised bones due to injury. °¨ Strained muscles or cartilage due to frequent or repeated coughing or overwork. °¨ Fracture to one or more ribs. °¨ Sore cartilage due to inflammation (costochondritis). °RISK FACTORS  °Risk factors for chest pain may include: °· Activities that increase your risk for trauma or injury to your chest. °· Respiratory infections or conditions that cause frequent coughing. °· Medical conditions or overeating that can cause heartburn. °· Heart disease or family history of heart disease. °· Conditions or health behaviors that increase your risk of developing a blood clot. °· Having had chicken pox (varicella zoster). °SIGNS AND SYMPTOMS °Chest pain can feel like: °· Burning or tingling on the surface of your chest or deep in your chest. °· Crushing, pressure, aching, or squeezing pain. °· Dull or sharp pain that is worse when you move, cough, or take a deep breath. °· Pain that is also felt in your back, neck, shoulder, or arm, or pain that spreads to any of these areas. °Your chest pain may come and go, or it may stay  constant. °DIAGNOSIS °Lab tests or other studies may be needed to find the cause of your pain. Your health care provider may have you take a test called an ambulatory ECG (electrocardiogram). An ECG records your heartbeat patterns at the time the test is performed. You may also have other tests, such as: °· Transthoracic echocardiogram (TTE). During echocardiography, sound waves are used to create a picture of all of the heart structures and to look at how blood flows through your heart. °· Transesophageal echocardiogram (TEE). This is a more advanced imaging test that obtains images from inside your body. It allows your health care provider to see your heart in finer detail. °· Cardiac monitoring. This allows your health care provider to monitor your heart rate and rhythm in real time. °· Holter monitor. This is a portable device that records your heartbeat and can help to diagnose abnormal heartbeats. It allows your health care provider to track your heart activity for several days, if needed. °· Stress tests. These can be done through exercise or by taking medicine that makes your heart beat more quickly. °· Blood tests. °· Imaging tests. °TREATMENT  °Your treatment depends on what is causing your chest pain. Treatment may include: °· Medicines. These may include: °¨ Acid blockers for heartburn. °¨ Anti-inflammatory medicine. °¨ Pain medicine for inflammatory conditions. °¨ Antibiotic medicine, if an infection is present. °¨ Medicines to dissolve blood clots. °¨ Medicines to treat coronary artery disease. °· Supportive care for conditions that do not require medicines. This may include: °¨ Resting. °¨ Applying heat   or cold packs to injured areas. °¨ Limiting activities until pain decreases. °HOME CARE INSTRUCTIONS °· If you were prescribed an antibiotic medicine, finish it all even if you start to feel better. °· Avoid any activities that bring on chest pain. °· Do not use any tobacco products, including  cigarettes, chewing tobacco, or electronic cigarettes. If you need help quitting, ask your health care provider. °· Do not drink alcohol. °· Take medicines only as directed by your health care provider. °· Keep all follow-up visits as directed by your health care provider. This is important. This includes any further testing if your chest pain does not go away. °· If heartburn is the cause for your chest pain, you may be told to keep your head raised (elevated) while sleeping. This reduces the chance that acid will go from your stomach into your esophagus. °· Make lifestyle changes as directed by your health care provider. These may include: °¨ Getting regular exercise. Ask your health care provider to suggest some activities that are safe for you. °¨ Eating a heart-healthy diet. A registered dietitian can help you to learn healthy eating options. °¨ Maintaining a healthy weight. °¨ Managing diabetes, if necessary. °¨ Reducing stress. °SEEK MEDICAL CARE IF: °· Your chest pain does not go away after treatment. °· You have a rash with blisters on your chest. °· You have a fever. °SEEK IMMEDIATE MEDICAL CARE IF:  °· Your chest pain is worse. °· You have an increasing cough, or you cough up blood. °· You have severe abdominal pain. °· You have severe weakness. °· You faint. °· You have chills. °· You have sudden, unexplained chest discomfort. °· You have sudden, unexplained discomfort in your arms, back, neck, or jaw. °· You have shortness of breath at any time. °· You suddenly start to sweat, or your skin gets clammy. °· You feel nauseous or you vomit. °· You suddenly feel light-headed or dizzy. °· Your heart begins to beat quickly, or it feels like it is skipping beats. °These symptoms may represent a serious problem that is an emergency. Do not wait to see if the symptoms will go away. Get medical help right away. Call your local emergency services (911 in the [place] Do not drive yourself to the hospital. °  °This  information is not intended to replace advice given to you by your health care provider. Make sure you discuss any questions you have with your health care provider. °  °Document Released: 07/01/2005 Document Revised: 10/12/2014 Document Reviewed: 04/27/2014 °Elsevier Interactive Patient Education ©2016 [employer] ° °

## 2015-12-10 NOTE — ED Provider Notes (Signed)
CSN: [id number]     Arrival date & time 12/10/15  1035 History  By signing my name below, I, Evelene Croon, attest that this documentation has been prepared under the direction and in the presence of Virgel Manifold, MD . Electronically Signed: Evelene Croon, Scribe. 12/10/2015. 11:04 AM.    Chief Complaint  Patient presents with  . Chest Pain    The history is provided by the patient. No language interpreter was used.    HPI Comments:  Kelsey Sandoval is a 82 y.o. female with a history of GERD, HLD, AFIB and pacemaker,  who presents to the Emergency Department complaining of central CP x 3 days. Pt states her pain feels like indigestion. She notes the pain improved yesterday and then moved to epigastric region last night. She reports pain only when swallowing and states she feelsl like she needs to burp. She describes a knot in her epigastric region  She has taken gas X with mild relief. Pt reports associated fatigue. Denies acute difficulty breathing, back pain,  and LE swelling. Pt is currently on pradaxa.  Past Medical History  Diagnosis Date  . Hypertension   . Dysrhythmia   . Anxiety   . Depression   . Pacemaker   . Arthritis   . Hyperlipidemia   . Pre-diabetes   . Osteopenia   . Atrial fibrillation ([place])   . Back pain, chronic   . Cataracts, bilateral   . Urine protein increased   . Nephrotic syndrome     RHUX  . Membranous nephropathy determined by biopsy 06/07/2015   Past Surgical History  Procedure Laterality Date  . Insert / replace / remove pacemaker    . Appendectomy    . Abdominal hysterectomy      partial-pt has no ovaries  . Back surgery    . Colonoscopy  9/05  . Dual chamber pacemaker      2012  . Colonoscopy N/A 10/11/2013    Procedure: COLONOSCOPY;  Surgeon: Rogene Houston, MD;  Location: [facility];  Service: Endoscopy;  Laterality: N/A;  1200  . Esophagogastroduodenoscopy N/A 01/17/2014    Procedure: ESOPHAGOGASTRODUODENOSCOPY (EGD);  Surgeon: Rogene Houston, MD;  Location: [facility];  Service: Endoscopy;  Laterality: N/A;  230  . Biopsy N/A 01/17/2014    Procedure: BIOPSY;  Surgeon: Rogene Houston, MD;  Location: [facility];  Service: Endoscopy;  Laterality: N/A;  . Wisdom tooth extracton    . Cataract extraction w/phaco Left 02/28/2015    Procedure: CATARACT EXTRACTION PHACO AND INTRAOCULAR LENS PLACEMENT (IOC);  Surgeon: [name], MD;  Location: [facility];  Service: Ophthalmology;  Laterality: Left;  CDE 18.71   Family History  Problem Relation Age of Onset  . Colon cancer Brother    Social History  Substance Use Topics  . Smoking status: Never Smoker   . Smokeless tobacco: Never Used  . Alcohol Use: No   OB History    No data available     Review of Systems  Constitutional: Positive for fatigue.  Cardiovascular: Positive for chest pain. Negative for leg swelling.  Gastrointestinal: Positive for abdominal pain (epigastric). Negative for vomiting.  Neurological: Positive for weakness. Negative for light-headedness.  All other systems reviewed and are negative.   Allergies  Penicillins; Levaquin; Sulfa antibiotics; and Zithromax  Home Medications   Prior to Admission medications   Medication Sig Start Date End Date Taking? Authorizing Provider  albuterol (PROVENTIL HFA;VENTOLIN HFA) 108 (90 BASE) MCG/ACT inhaler  Inhale 2 puffs into the lungs every 6 (six) hours as needed for wheezing or shortness of breath. 09/17/14   Kathyrn Drown, MD  ALPRAZolam Duanne Moron) 1 MG tablet TAKE ONE TABLET BY MOUTH TWICE DAILY AS NEEDED FOR ANXIETY 09/03/15   Kathyrn Drown, MD  amLODipine (NORVASC) 10 MG tablet TAKE ONE TABLET BY MOUTH ONCE DAILY 10/31/15   Kathyrn Drown, MD  dabigatran (PRADAXA) 150 MG CAPS capsule Take 1 capsule (150 mg total) by mouth 2 (two) times daily. 06/03/15   Herminio Commons, MD  famotidine (PEPCID) 20 MG tablet Take 1 tablet (20 mg total) by mouth 2 (two) times daily. 03/11/15   Kathie Dike, MD  flecainide  (TAMBOCOR) 50 MG tablet TAKE ONE TABLET BY MOUTH TWICE DAILY 07/09/15   Herminio Commons, MD  gabapentin (NEURONTIN) 100 MG capsule Take 2 capsules (200 mg total) by mouth 2 (two) times daily. 07/04/15   Kathyrn Drown, MD  HYDROcodone-acetaminophen (NORCO/VICODIN) 5-325 MG tablet Take 1 tablet by mouth every 4 (four) hours as needed for moderate pain. 12/04/15   Kathyrn Drown, MD  levothyroxine (SYNTHROID, LEVOTHROID) 50 MCG tablet TAKE ONE TABLET BY MOUTH ONCE DAILY **CHANGE  IN  DOSE** 07/31/15   Kathyrn Drown, MD  lisinopril (PRINIVIL,ZESTRIL) 40 MG tablet Take 1 tablet (40 mg total) by mouth daily. 09/02/15   Kathyrn Drown, MD  metoprolol succinate (TOPROL-XL) 50 MG 24 hr tablet TAKE ONE TABLET BY MOUTH ONCE DAILY WITH  OR  IMMEDIATELY  FOLLOWING  A  MEAL 09/02/15   Kathyrn Drown, MD  ondansetron (ZOFRAN-ODT) 8 MG disintegrating tablet Take 1 tablet (8 mg total) by mouth every 8 (eight) hours as needed for nausea or vomiting. 09/19/15   Kathyrn Drown, MD  pravastatin (PRAVACHOL) 80 MG tablet Take 1 tablet (80 mg total) by mouth daily. 09/02/15   Kathyrn Drown, MD  riTUXimab in sodium chloride 0.9 % 250 mL Infusion 2 weeks apart: May repeat in 6 months. Follow up with primary care doctor prior to initiation of rituximab for infectious clearance 03/11/15   Kathie Dike, MD  sertraline (ZOLOFT) 50 MG tablet Take 1 tablet (50 mg total) by mouth daily. 12/04/15   Kathyrn Drown, MD  vitamin B-12 (CYANOCOBALAMIN) 1000 MCG tablet Take 1 tablet (1,000 mcg total) by mouth daily. 03/11/15   Kathie Dike, MD   BP 188/79 mmHg  Pulse 71  Temp(Src) 98.3 F (36.8 C) (Oral)  Resp 18  Ht 5\' 11"  (1.803 m)  Wt 157 lb (71.215 kg)  BMI 21.91 kg/m2  SpO2 100% Physical Exam  Constitutional: She appears well-developed and well-nourished. No distress.  HENT:  Head: Normocephalic and atraumatic.  Eyes: Conjunctivae are normal. Right eye exhibits no discharge. Left eye exhibits no discharge.  Neck: Neck  supple.  Cardiovascular: Normal rate and normal heart sounds.  An irregularly irregular rhythm present. Exam reveals no gallop and no friction rub.   No murmur heard. Pulmonary/Chest: Effort normal and breath sounds normal. No respiratory distress.  Abdominal: Soft. She exhibits no distension. There is no tenderness.  Musculoskeletal: She exhibits no edema or tenderness.  Neurological: She is alert.  Skin: Skin is warm and dry.  Psychiatric: She has a normal mood and affect. Her behavior is normal. Thought content normal.  Nursing note and vitals reviewed.   ED Course  Procedures   DIAGNOSTIC STUDIES:  Oxygen Saturation is 99% on RA, normal by my interpretation.    COORDINATION  OF CARE:  10:57 AM Will order CXR and blood work. Discussed treatment plan with pt at bedside and pt agreed to plan.  Labs Review Labs Reviewed  BASIC METABOLIC PANEL - Abnormal; Notable for the following:    Glucose, Bld 103 (*)    BUN 25 (*)    Creatinine, Ser 1.08 (*)    GFR calc non Af Amer 47 (*)    GFR calc Af Amer 55 (*)    All other components within normal limits  CBC WITH DIFFERENTIAL/PLATELET  TROPONIN I  TSH    Imaging Review No results found. I have personally reviewed and evaluated these images and lab results as part of my medical decision-making.   EKG Interpretation   Date/Time:  Tuesday December 10 2015 10:45:03 EST Ventricular Rate:  72 PR Interval:  173 QRS Duration: 103 QT Interval:  402 QTC Calculation: 440 R Axis:   51 Text Interpretation:  Sinus rhythm Non-specific ST-t changes Confirmed by  Wilson Singer  MD, Ranjit Ashurst ([id number]) on 12/10/2015 12:50:52 PM      MDM   Final diagnoses:  Chest pain, unspecified chest pain type    82 year old female with chest pain/epigastric pain. I suspect that this is GI in etiology.Atypical for ACS.  Workup has been fairly unremarkable including troponin which is normal after having constant symptoms since last night. Doubt ACS, PE, dissection  or other emergent process.  I personally preformed the services scribed in my presence. The recorded information has been reviewed is accurate. Virgel Manifold, MD.    Virgel Manifold, MD 12/13/15 [phone]

## 2015-12-11 MED ORDER — PANTOPRAZOLE SODIUM 40 MG PO TBEC: 40.0000 mg | DELAYED_RELEASE_TABLET | Freq: Every day | ORAL | Status: DC

## 2015-12-11 NOTE — Telephone Encounter (Signed)
Spoke with patient's emergency contact and informed her per Dr.Scott Luking-Dr.Scott Recommends sending in protonix 40 mg, 1 daily #30 refills. Also patient may supplement this with 2 tablespoons of Maalox every 6 hours as needed. Dr.Scott also recommends office visit next week for a follow up office visit next week for a recheck. Patient's emergency contact verbalized understanding.

## 2015-12-16 ENCOUNTER — Encounter: Payer: Self-pay | Admitting: *Deleted

## 2015-12-17 ENCOUNTER — Emergency Department (HOSPITAL_COMMUNITY)
Admission: EM | Admit: 2015-12-17 | Discharge: 2015-12-18 | Disposition: A | Discharge status: Home / Self Care | Payer: Medicare Other | Attending: Emergency Medicine | Admitting: Emergency Medicine

## 2015-12-17 ENCOUNTER — Encounter (HOSPITAL_COMMUNITY): Payer: Self-pay

## 2015-12-17 ENCOUNTER — Ambulatory Visit (INDEPENDENT_AMBULATORY_CARE_PROVIDER_SITE_OTHER): Payer: Medicare Other | Admitting: Family Medicine

## 2015-12-17 ENCOUNTER — Encounter: Payer: Self-pay | Admitting: Family Medicine

## 2015-12-17 VITALS — BP 154/90 | Temp 98.2°F | Ht 70.0 in | Wt 157.0 lb

## 2015-12-17 DIAGNOSIS — Z950 Presence of cardiac pacemaker: Secondary | ICD-10-CM | POA: Insufficient documentation

## 2015-12-17 DIAGNOSIS — R110 Nausea: Secondary | ICD-10-CM | POA: Diagnosis not present

## 2015-12-17 DIAGNOSIS — F339 Major depressive disorder, recurrent, unspecified: Secondary | ICD-10-CM

## 2015-12-17 DIAGNOSIS — I4891 Unspecified atrial fibrillation: Secondary | ICD-10-CM | POA: Insufficient documentation

## 2015-12-17 DIAGNOSIS — I10 Essential (primary) hypertension: Secondary | ICD-10-CM | POA: Insufficient documentation

## 2015-12-17 DIAGNOSIS — R1033 Periumbilical pain: Secondary | ICD-10-CM | POA: Diagnosis not present

## 2015-12-17 DIAGNOSIS — M1990 Unspecified osteoarthritis, unspecified site: Secondary | ICD-10-CM | POA: Diagnosis not present

## 2015-12-17 DIAGNOSIS — E785 Hyperlipidemia, unspecified: Secondary | ICD-10-CM | POA: Insufficient documentation

## 2015-12-17 DIAGNOSIS — R1013 Epigastric pain: Secondary | ICD-10-CM | POA: Diagnosis not present

## 2015-12-17 DIAGNOSIS — J111 Influenza due to unidentified influenza virus with other respiratory manifestations: Secondary | ICD-10-CM | POA: Diagnosis not present

## 2015-12-17 DIAGNOSIS — F329 Major depressive disorder, single episode, unspecified: Secondary | ICD-10-CM | POA: Insufficient documentation

## 2015-12-17 DIAGNOSIS — R1010 Upper abdominal pain, unspecified: Secondary | ICD-10-CM | POA: Diagnosis present

## 2015-12-17 MED ORDER — SERTRALINE HCL 100 MG PO TABS: 100.0000 mg | ORAL_TABLET | Freq: Every day | ORAL | Status: DC

## 2015-12-17 MED ORDER — SUCRALFATE 1 GM/10ML PO SUSP: ORAL | Status: DC

## 2015-12-17 NOTE — Progress Notes (Signed)
   Subjective:    Patient ID: Kelsey Sandoval, female    DOB: March 28, 1933, 82 y.o.   MRN: [id number]  Chest Pain  This is a new problem. The current episode started in the past 7 days. The problem has been gradually improving. The pain radiates to the epigastrium. Associated symptoms include nausea. Pertinent negatives include no abdominal pain, cough, fever or vomiting. Associated symptoms comments: Indigestion, Flatulence .   Patient in today for an ER follow up for chest pain. Patient seen at [facility] on 12/10/15. States symptoms have gradually improved.  Patient today has c/o nausea, and dizziness. States that she is now forcing self to eat, constantly has gas and sensation of fullness.   Review of Systems  Constitutional: Negative for fever and fatigue.  HENT: Negative for congestion.   Respiratory: Negative for cough and choking.   Cardiovascular: Negative for chest pain.  Gastrointestinal: Positive for nausea and abdominal distention. Negative for vomiting, abdominal pain, diarrhea, constipation and blood in stool.   she describes nausea  In feeling bloated from her stomach that radiates into the low part of her chest recently seen in ER at testing done which did not show any type of heart related issue currently     Objective:   Physical Exam   lungs clear heart regular abdomen soft no guarding rebound or tenderness      Assessment & Plan:   patient with subjected epigastric pain for appetite not feeling well we will do additional lab work. Patient may need referral to gastroenterology possibly. Recheck patient in approximately 1 week Hemoccult cards ordered Referral to psychology because she lost her friend and she still with depression Increase it fairly 200 mg  recheck patient in one week

## 2015-12-17 NOTE — ED Notes (Signed)
C/o abdominal pain, nausea vomiting and weakness.

## 2015-12-18 ENCOUNTER — Encounter: Payer: Self-pay | Admitting: Family Medicine

## 2015-12-18 DIAGNOSIS — R110 Nausea: Secondary | ICD-10-CM | POA: Diagnosis not present

## 2015-12-18 LAB — COMPREHENSIVE METABOLIC PANEL
ALT: 18 U/L (ref 14–54)
AST: 27 U/L (ref 15–41)
Albumin: 3.4 g/dL — ABNORMAL LOW (ref 3.5–5.0)
Alkaline Phosphatase: 71 U/L (ref 38–126)
Anion gap: 7 (ref 5–15)
BILIRUBIN TOTAL: 0.3 mg/dL (ref 0.3–1.2)
BUN: 27 mg/dL — AB (ref 6–20)
CO2: 26 mmol/L (ref 22–32)
Calcium: 9 mg/dL (ref 8.9–10.3)
Chloride: 105 mmol/L (ref 101–111)
Creatinine, Ser: 1.31 mg/dL — ABNORMAL HIGH (ref 0.44–1.00)
GFR calc Af Amer: 43 mL/min — ABNORMAL LOW (ref >60)
GFR, EST NON AFRICAN AMERICAN: 37 mL/min — AB (ref >60)
Glucose, Bld: 126 mg/dL — ABNORMAL HIGH (ref 65–99)
POTASSIUM: 4.3 mmol/L (ref 3.5–5.1)
Sodium: 138 mmol/L (ref 135–145)
TOTAL PROTEIN: 5.9 g/dL — AB (ref 6.5–8.1)

## 2015-12-18 LAB — HEPATIC FUNCTION PANEL
ALT: 17 IU/L (ref 0–32)
AST: 29 IU/L (ref 0–40)
Albumin: 3.8 g/dL (ref 3.5–4.7)
Alkaline Phosphatase: 74 IU/L (ref 39–117)
BILIRUBIN TOTAL: 0.4 mg/dL (ref 0.0–1.2)
BILIRUBIN, DIRECT: 0.1 mg/dL (ref 0.00–0.40)
Total Protein: 6.1 g/dL (ref 6.0–8.5)

## 2015-12-18 LAB — BASIC METABOLIC PANEL
BUN/Creatinine Ratio: 18 (ref 11–26)
BUN: 23 mg/dL (ref 8–27)
CALCIUM: 9.4 mg/dL (ref 8.7–10.3)
CHLORIDE: 101 mmol/L (ref 96–106)
CO2: 26 mmol/L (ref 18–29)
CREATININE: 1.26 mg/dL — AB (ref 0.57–1.00)
GFR, EST AFRICAN AMERICAN: 46 mL/min/{1.73_m2} — AB (ref >59)
GFR, EST NON AFRICAN AMERICAN: 40 mL/min/{1.73_m2} — AB (ref >59)
Glucose: 100 mg/dL — ABNORMAL HIGH (ref 65–99)
Potassium: 4.3 mmol/L (ref 3.5–5.2)
Sodium: 142 mmol/L (ref 134–144)

## 2015-12-18 LAB — CBC WITH DIFFERENTIAL/PLATELET
BASOS PCT: 0 %
Basophils Absolute: 0 10*3/uL (ref 0.0–0.1)
EOS ABS: 0 10*3/uL (ref 0.0–0.7)
EOS PCT: 0 %
HEMATOCRIT: 32.9 % — AB (ref 36.0–46.0)
Hemoglobin: 11.7 g/dL — ABNORMAL LOW (ref 12.0–15.0)
Lymphocytes Relative: 19 %
Lymphs Abs: 1.9 10*3/uL (ref 0.7–4.0)
MCH: 33.1 pg (ref 26.0–34.0)
MCHC: 35.6 g/dL (ref 30.0–36.0)
MCV: 92.9 fL (ref 78.0–100.0)
MONO ABS: 0.6 10*3/uL (ref 0.1–1.0)
MONOS PCT: 6 %
NEUTROS PCT: 75 %
Neutro Abs: 7.4 10*3/uL (ref 1.7–7.7)
PLATELETS: 189 10*3/uL (ref 150–400)
RBC: 3.54 MIL/uL — AB (ref 3.87–5.11)
RDW: 12.2 % (ref 11.5–15.5)
WBC: 10 10*3/uL (ref 4.0–10.5)

## 2015-12-18 LAB — TROPONIN I

## 2015-12-18 LAB — LIPASE: Lipase: 44 U/L (ref 0–59)

## 2015-12-18 LAB — LIPASE, BLOOD: Lipase: 24 U/L (ref 11–51)

## 2015-12-18 MED ORDER — SODIUM CHLORIDE 0.9 % IV BOLUS (SEPSIS)
1000.0000 mL | Freq: Once | INTRAVENOUS | Status: AC
  Administered 2015-12-18: 1000 mL via INTRAVENOUS

## 2015-12-18 MED ORDER — ONDANSETRON HCL 4 MG/2ML IJ SOLN
4.0000 mg | Freq: Once | INTRAMUSCULAR | Status: AC
  Administered 2015-12-18: 4 mg via INTRAVENOUS
  Filled 2015-12-18: qty 2

## 2015-12-18 MED ORDER — METOCLOPRAMIDE HCL 5 MG/ML IJ SOLN
INTRAMUSCULAR | Status: AC
  Filled 2015-12-18: qty 2

## 2015-12-18 MED ORDER — DICYCLOMINE HCL 10 MG/ML IM SOLN
20.0000 mg | Freq: Once | INTRAMUSCULAR | Status: AC
  Administered 2015-12-18: 20 mg via INTRAMUSCULAR
  Filled 2015-12-18: qty 2

## 2015-12-18 MED ORDER — METOCLOPRAMIDE HCL 5 MG/ML IJ SOLN
10.0000 mg | Freq: Once | INTRAMUSCULAR | Status: AC
  Administered 2015-12-18: 10 mg via INTRAVENOUS

## 2015-12-18 MED ORDER — FAMOTIDINE IN NACL 20-0.9 MG/50ML-% IV SOLN
20.0000 mg | Freq: Once | INTRAVENOUS | Status: AC
  Administered 2015-12-18: 20 mg via INTRAVENOUS
  Filled 2015-12-18: qty 50

## 2015-12-18 MED ORDER — METOCLOPRAMIDE HCL 10 MG PO TABS: 10.0000 mg | ORAL_TABLET | Freq: Four times a day (QID) | ORAL | Status: DC | PRN

## 2015-12-18 MED ORDER — DICYCLOMINE HCL 20 MG PO TABS
20.0000 mg | ORAL_TABLET | Freq: Three times a day (TID) | ORAL | Status: DC
Start: 2015-12-18 — End: 2015-12-24

## 2015-12-18 MED ORDER — DIPHENHYDRAMINE HCL 50 MG/ML IJ SOLN
INTRAMUSCULAR | Status: AC
  Filled 2015-12-18: qty 1

## 2015-12-18 MED ORDER — DIPHENHYDRAMINE HCL 50 MG/ML IJ SOLN
25.0000 mg | Freq: Once | INTRAMUSCULAR | Status: AC
  Administered 2015-12-18: 25 mg via INTRAVENOUS

## 2015-12-18 NOTE — ED Provider Notes (Signed)
CSN: [id number]     Arrival date & time 12/17/15  2217 History   First MD Initiated Contact with Patient 12/18/15 0020   Chief Complaint  Patient presents with  . Abdominal Pain     (Consider location/radiation/quality/duration/timing/severity/associated sxs/prior Treatment) HPI patient reports she has been having ongoing nausea in the morning for many months almost a year. She states she was admitted last year in June and had evaluation by gastroenterologist including a endoscopy that she states was normal and a stomach emptying test. She reports her boyfriend who lived with her for the past 4 years died in 2023/09/07. She reports she's been under a lot of stress since his death. She reports for the past 2 weeks she's had some burning in her upper abdomen and lower chest. She was seen in the ED last week for similar symptoms and was prescribed protonix which she states did help. She was seen by her PCP this morning and he changed her Protonix to sucralfate and she's only had one dose. He also increased her Zoloft. She states this afternoon she started getting nausea without vomiting, and having epigastric burning that radiates into her lower chest. She states she took Mylanta which helped briefly. Dates her stools have been dark since she took some Mylanta. She also reports her stomach has a roaring and her daughter states you can hear it if you sitting in the room with her.   PCP Dr Wolfgang Phoenix GI Dr Laural Golden  Past Medical History  Diagnosis Date  . Hypertension   . Dysrhythmia   . Anxiety   . Depression   . Pacemaker   . Arthritis   . Hyperlipidemia   . Pre-diabetes   . Osteopenia   . Atrial fibrillation ([place])   . Back pain, chronic   . Cataracts, bilateral   . Urine protein increased   . Nephrotic syndrome     RHUX  . Membranous nephropathy determined by biopsy 06/07/2015   Past Surgical History  Procedure Laterality Date  . Insert / replace / remove pacemaker    . Appendectomy    .  Abdominal hysterectomy      partial-pt has no ovaries  . Back surgery    . Colonoscopy  9/05  . Dual chamber pacemaker      2012  . Colonoscopy N/A 10/11/2013    Procedure: COLONOSCOPY;  Surgeon: Rogene Houston, MD;  Location: [facility];  Service: Endoscopy;  Laterality: N/A;  1200  . Esophagogastroduodenoscopy N/A 01/17/2014    Procedure: ESOPHAGOGASTRODUODENOSCOPY (EGD);  Surgeon: Rogene Houston, MD;  Location: [facility];  Service: Endoscopy;  Laterality: N/A;  230  . Biopsy N/A 01/17/2014    Procedure: BIOPSY;  Surgeon: Rogene Houston, MD;  Location: [facility];  Service: Endoscopy;  Laterality: N/A;  . Wisdom tooth extracton    . Cataract extraction w/phaco Left 02/28/2015    Procedure: CATARACT EXTRACTION PHACO AND INTRAOCULAR LENS PLACEMENT (IOC);  Surgeon: [name], MD;  Location: [facility];  Service: Ophthalmology;  Laterality: Left;  CDE 18.71   Family History  Problem Relation Age of Onset  . Colon cancer Brother    Social History  Substance Use Topics  . Smoking status: Never Smoker   . Smokeless tobacco: Never Used  . Alcohol Use: No   Lives alone  OB History    No data available     Review of Systems  All other systems reviewed and are negative.     Allergies  Penicillins; Levaquin; Sulfa antibiotics; and Zithromax  Home Medications   Prior to Admission medications   Medication Sig Start Date End Date Taking? Authorizing Provider  albuterol (PROVENTIL HFA;VENTOLIN HFA) 108 (90 BASE) MCG/ACT inhaler Inhale 2 puffs into the lungs every 6 (six) hours as needed for wheezing or shortness of breath. 09/17/14   Kathyrn Drown, MD  ALPRAZolam Duanne Moron) 1 MG tablet TAKE ONE TABLET BY MOUTH TWICE DAILY AS NEEDED FOR ANXIETY 09/03/15   Kathyrn Drown, MD  amLODipine (NORVASC) 10 MG tablet TAKE ONE TABLET BY MOUTH ONCE DAILY 10/31/15   Kathyrn Drown, MD  dabigatran (PRADAXA) 150 MG CAPS capsule Take 1 capsule (150 mg total) by mouth 2 (two) times daily. 06/03/15    Herminio Commons, MD  dicyclomine (BENTYL) 20 MG tablet Take 1 tablet (20 mg total) by mouth 4 (four) times daily -  before meals and at bedtime. 12/18/15   Rolland Porter, MD  flecainide (TAMBOCOR) 50 MG tablet TAKE ONE TABLET BY MOUTH TWICE DAILY 07/09/15   Herminio Commons, MD  gabapentin (NEURONTIN) 100 MG capsule Take 2 capsules (200 mg total) by mouth 2 (two) times daily. 07/04/15   Kathyrn Drown, MD  HYDROcodone-acetaminophen (NORCO/VICODIN) 5-325 MG tablet Take 1 tablet by mouth every 4 (four) hours as needed for moderate pain. 12/04/15   Kathyrn Drown, MD  levothyroxine (SYNTHROID, LEVOTHROID) 50 MCG tablet TAKE ONE TABLET BY MOUTH ONCE DAILY **CHANGE  IN  DOSE** 07/31/15   Kathyrn Drown, MD  lisinopril (PRINIVIL,ZESTRIL) 40 MG tablet Take 1 tablet (40 mg total) by mouth daily. 09/02/15   Kathyrn Drown, MD  metoCLOPramide (REGLAN) 10 MG tablet Take 1 tablet (10 mg total) by mouth every 6 (six) hours as needed for nausea. 12/18/15   Rolland Porter, MD  metoprolol succinate (TOPROL-XL) 50 MG 24 hr tablet TAKE ONE TABLET BY MOUTH ONCE DAILY WITH  OR  IMMEDIATELY  FOLLOWING  A  MEAL 09/02/15   Kathyrn Drown, MD  ondansetron (ZOFRAN-ODT) 8 MG disintegrating tablet Take 1 tablet (8 mg total) by mouth every 8 (eight) hours as needed for nausea or vomiting. 09/19/15   Kathyrn Drown, MD  pantoprazole (PROTONIX) 40 MG tablet Take 1 tablet (40 mg total) by mouth daily. 12/11/15   Kathyrn Drown, MD  pravastatin (PRAVACHOL) 80 MG tablet Take 1 tablet (80 mg total) by mouth daily. 09/02/15   Kathyrn Drown, MD  riTUXimab in sodium chloride 0.9 % 250 mL Infusion 2 weeks apart: May repeat in 6 months. Follow up with primary care doctor prior to initiation of rituximab for infectious clearance 03/11/15   Kathie Dike, MD  sertraline (ZOLOFT) 100 MG tablet Take 1 tablet (100 mg total) by mouth daily. 12/17/15   Kathyrn Drown, MD  sucralfate (CARAFATE) 1 GM/10ML suspension Take 10cc by mouth four times a day.  12/17/15   Kathyrn Drown, MD  vitamin B-12 (CYANOCOBALAMIN) 1000 MCG tablet Take 1 tablet (1,000 mcg total) by mouth daily. 03/11/15   Kathie Dike, MD   BP 171/85 mmHg  Pulse 73  Temp(Src) 98.7 F (37.1 C) (Temporal)  Resp 18  Ht 5\' 10"  (1.778 m)  Wt 156 lb (70.761 kg)  BMI 22.38 kg/m2  SpO2 98%  Vital signs normal except hypertension  Physical Exam  Constitutional: She is oriented to person, place, and time. She appears well-developed and well-nourished.  Non-toxic appearance. She does not appear ill. No distress.  HENT:  Head:  Normocephalic and atraumatic.  Right Ear: External ear normal.  Left Ear: External ear normal.  Nose: Nose normal. No mucosal edema or rhinorrhea.  Mouth/Throat: Oropharynx is clear and moist and mucous membranes are normal. No dental abscesses or uvula swelling.  Eyes: Conjunctivae and EOM are normal. Pupils are equal, round, and reactive to light.  Neck: Normal range of motion and full passive range of motion without pain. Neck supple.  Cardiovascular: Normal rate, regular rhythm and normal heart sounds.  Exam reveals no gallop and no friction rub.   No murmur heard. Pulmonary/Chest: Effort normal and breath sounds normal. No respiratory distress. She has no wheezes. She has no rhonchi. She has no rales. She exhibits no tenderness and no crepitus.    Area of discomfort noted  Abdominal: Soft. Normal appearance and bowel sounds are normal. She exhibits no distension. There is no tenderness. There is no rebound and no guarding.  Musculoskeletal: Normal range of motion. She exhibits no edema or tenderness.  Moves all extremities well.   Neurological: She is alert and oriented to person, place, and time. She has normal strength. No cranial nerve deficit.  Skin: Skin is warm, dry and intact. No rash noted. No erythema. No pallor.  Psychiatric: She has a normal mood and affect. Her speech is normal and behavior is normal. Her mood appears not anxious.    Nursing note and vitals reviewed.   ED Course  Procedures (including critical care time)  Medications  sodium chloride 0.9 % bolus 1,000 mL (0 mLs Intravenous Stopped 12/18/15 0140)  ondansetron (ZOFRAN) injection 4 mg (4 mg Intravenous Given 12/18/15 0052)  famotidine (PEPCID) IVPB 20 mg premix (0 mg Intravenous Stopped 12/18/15 0140)  dicyclomine (BENTYL) injection 20 mg (20 mg Intramuscular Given 12/18/15 0052)  sodium chloride 0.9 % bolus 1,000 mL (0 mLs Intravenous Stopped 12/18/15 0236)  ondansetron (ZOFRAN) injection 4 mg (4 mg Intravenous Given 12/18/15 0236)  metoCLOPramide (REGLAN) injection 10 mg (10 mg Intravenous Given 12/18/15 0404)  diphenhydrAMINE (BENADRYL) injection 25 mg (25 mg Intravenous Given 12/18/15 0404)    Patient was given IV fluids, IV Zofran and Bentyl IM.  1:20 AM patient was rechecked. She states she's starting to feel a little better. After reviewing her labs she started to have a mild increase of her chronic renal insufficiency, she was given a second liter of IV fluid. She states she is starting to have urinary output.  Recheck at 2:25 AM patient states she still has some nausea. She was given additional dose of Zofran.  Recheck at 3:40 AM patient states she is feeling improved. She only has very minimal nausea and feels ready to be discharged.  4 AM nurse reports patient got up to go to the bathroom and now she's complaining of more nausea. She was given Reglan and Benadryl IV.  At time of discharge, we discussed she has had nausea for several months and I would not be able to make it do away tonight.   Labs Review Results for orders placed or performed during the hospital encounter of 12/17/15  Comprehensive metabolic panel  Result Value Ref Range   Sodium 138 135 - 145 mmol/L   Potassium 4.3 3.5 - 5.1 mmol/L   Chloride 105 101 - 111 mmol/L   CO2 26 22 - 32 mmol/L   Glucose, Bld 126 (H) 65 - 99 mg/dL   BUN 27 (H) 6 - 20 mg/dL   Creatinine, Ser 1.31  (H) 0.44 - 1.00 mg/dL  Calcium 9.0 8.9 - 10.3 mg/dL   Total Protein 5.9 (L) 6.5 - 8.1 g/dL   Albumin 3.4 (L) 3.5 - 5.0 g/dL   AST 27 15 - 41 U/L   ALT 18 14 - 54 U/L   Alkaline Phosphatase 71 38 - 126 U/L   Total Bilirubin 0.3 0.3 - 1.2 mg/dL   GFR calc non Af Amer 37 (L) >60 mL/min   GFR calc Af Amer 43 (L) >60 mL/min   Anion gap 7 5 - 15  Troponin I  Result Value Ref Range   Troponin I <0.03 <0.031 ng/mL  Lipase, blood  Result Value Ref Range   Lipase 24 11 - 51 U/L  CBC with Differential  Result Value Ref Range   WBC 10.0 4.0 - 10.5 K/uL   RBC 3.54 (L) 3.87 - 5.11 MIL/uL   Hemoglobin 11.7 (L) 12.0 - 15.0 g/dL   HCT 32.9 (L) 36.0 - 46.0 %   MCV 92.9 78.0 - 100.0 fL   MCH 33.1 26.0 - 34.0 pg   MCHC 35.6 30.0 - 36.0 g/dL   RDW 12.2 11.5 - 15.5 %   Platelets 189 150 - 400 K/uL   Neutrophils Relative % 75 %   Neutro Abs 7.4 1.7 - 7.7 K/uL   Lymphocytes Relative 19 %   Lymphs Abs 1.9 0.7 - 4.0 K/uL   Monocytes Relative 6 %   Monocytes Absolute 0.6 0.1 - 1.0 K/uL   Eosinophils Relative 0 %   Eosinophils Absolute 0.0 0.0 - 0.7 K/uL   Basophils Relative 0 %   Basophils Absolute 0.0 0.0 - 0.1 K/uL    Laboratory interpretation all normal except anemia, renal insufficiency (old),     Imaging Review No results found. I have personally reviewed and evaluated these images and lab results as part of my medical decision-making.   EKG Interpretation   Date/Time:  Wednesday December 18 2015 01:01:47 EDT Ventricular Rate:  73 PR Interval:  61 QRS Duration: 99 QT Interval:  431 QTC Calculation: 475 R Axis:   30 Text Interpretation:  Sinus rhythm Short PR interval Baseline wander  Nonspecific ST and T wave abnormality No significant change since last  tracing 10 Dec 2015 Confirmed by Charmion Hapke  MD-I, Kortlyn Koltz ([id number]) on 12/18/2015  1:23:35 AM      MDM   Final diagnoses:  Nausea    Discharge Medication List as of 12/18/2015  4:37 AM     dicyclomine (BENTYL) 20 MG tablet Take  1 tablet (20 mg total) by mouth 4 (four) times daily -  before meals and at bedtime. 12/18/15   Rolland Porter, MD   metoCLOPramide (REGLAN) 10 MG tablet Take 1 tablet (10 mg total) by mouth every 6 (six) hours as needed for nausea. 12/18/15   Rolland Porter, MD    Plan discharge  Rolland Porter, MD, Barbette Or, MD 12/18/15 [phone]

## 2015-12-18 NOTE — Addendum Note (Signed)
Addended by: Dairl Ponder on: 12/18/2015 01:24 PM   Modules accepted: Orders

## 2015-12-18 NOTE — Discharge Instructions (Signed)
Drink plenty of fluids a don't get dehydrated. Try the Reglan and Bentyl for your nausea and stomach pain. Follow-up with either your primary care doctor or the gastroenterologist to see what further testing need to evaluate your nausea. Nausea, Adult Nausea is the feeling that you have an upset stomach or have to vomit. Nausea by itself is not likely a serious concern, but it may be an early sign of more serious medical problems. As nausea gets worse, it can lead to vomiting. If vomiting develops, there is the risk of dehydration.  CAUSES   Viral infections.  Food poisoning.  Medicines.  Pregnancy.  Motion sickness.  Migraine headaches.  Emotional distress.  Severe pain from any source.  Alcohol intoxication. HOME CARE INSTRUCTIONS  Get plenty of rest.  Ask your caregiver about specific rehydration instructions.  Eat small amounts of food and sip liquids more often.  Take all medicines as told by your caregiver. SEEK MEDICAL CARE IF:  You have not improved after 2 days, or you get worse.  You have a headache. SEEK IMMEDIATE MEDICAL CARE IF:   You have a fever.  You faint.  You keep vomiting or have blood in your vomit.  You are extremely weak or dehydrated.  You have dark or bloody stools.  You have severe chest or abdominal pain. MAKE SURE YOU:  Understand these instructions.  Will watch your condition.  Will get help right away if you are not doing well or get worse.   This information is not intended to replace advice given to you by your health care provider. Make sure you discuss any questions you have with your health care provider.   Document Released: 10/29/2004 Document Revised: 10/12/2014 Document Reviewed: 06/03/2011 Elsevier Interactive Patient Education [employer].

## 2015-12-19 ENCOUNTER — Encounter (INDEPENDENT_AMBULATORY_CARE_PROVIDER_SITE_OTHER): Payer: Self-pay | Admitting: *Deleted

## 2015-12-23 ENCOUNTER — Telehealth (HOSPITAL_COMMUNITY): Payer: Self-pay | Admitting: *Deleted

## 2015-12-24 ENCOUNTER — Ambulatory Visit (INDEPENDENT_AMBULATORY_CARE_PROVIDER_SITE_OTHER): Payer: Medicare Other | Admitting: Family Medicine

## 2015-12-24 ENCOUNTER — Telehealth (HOSPITAL_COMMUNITY): Payer: Self-pay | Admitting: *Deleted

## 2015-12-24 VITALS — BP 122/82 | Ht 70.0 in | Wt 152.0 lb

## 2015-12-24 DIAGNOSIS — R110 Nausea: Secondary | ICD-10-CM

## 2015-12-24 DIAGNOSIS — R1033 Periumbilical pain: Secondary | ICD-10-CM | POA: Diagnosis not present

## 2015-12-24 DIAGNOSIS — R1013 Epigastric pain: Secondary | ICD-10-CM

## 2015-12-24 LAB — POCT URINALYSIS DIPSTICK
PH UA: 6
SPEC GRAV UA: 1.015

## 2015-12-24 LAB — POC HEMOCCULT BLD/STL (HOME/3-CARD/SCREEN)
FECAL OCCULT BLD: NEGATIVE
FECAL OCCULT BLD: NEGATIVE
Fecal Occult Blood, POC: NEGATIVE

## 2015-12-24 MED ORDER — ONDANSETRON 8 MG PO TBDP: 8.0000 mg | ORAL_TABLET | Freq: Three times a day (TID) | ORAL | Status: DC | PRN

## 2015-12-24 NOTE — Progress Notes (Signed)
   Subjective:    Patient ID: Kelsey Sandoval, female    DOB: 06-12-1933, 82 y.o.   MRN: [id number]  HPI  Patient arrives for follow up on dyspepsia. Patient states she is in bad shape-cant eat at all-weak-has not heard from GI office This patient is very frustrated as well as her daughter about the recent turn of events regarding the patient's health she is had significant fatigue nausea not wanting to eat. In addition to this has been feeling depressed about the loss of a close friend a few months ago she also relates some intermittent burning epigastrium along with occasional vomiting she denies rectal bleeding she denies sweats or chills chest pressure or tightness. She finds herself feeling very weak not urinating as much urination is been dark.  Patient was in the hospital several months ago last June 2016 she had evaluations by the hospitalist as well as gastroenterology including ultrasound gastric emptying study and CAT scan. Patient overall still been having intermittent troubles but nothing quite like what's going on now until the past few weeks she's been to the ER twice in the past 10 days she has an appointment coming up with gastroenterology in April but she was not aware of that. Review of Systems Denies fevers does relate weight loss does relate intermittent dizziness and weakness walking with a cane    Objective:   Physical Exam  Patient looks to feel ill orthostatics are negative lungs are clear hearts regular abdomen soft mild epigastric tenderness extremities no edema patient very low on energy level.      Assessment & Plan:  Lab work ordered for this week Possibility of Addison's may need to be worked up if other workup negative Significant depression continue current medication hope that the [facility] will be able to help this patient  Significant persistent nausea I believe this patient would benefit from EGD to rule out tumor rule out ulcer Also believe she would  benefit from a HIDA test to rule out gallbladder dysfunction In addition to this I told the patient not to use Reglan and not to use Bentyl because of her age as well as drowsiness side effects. She may use Carafate as well as her PPI 25 minutes spent with patient greater than half in discussion of multiple issues  I spoke with Dr.Rehman regarding her situation he will work her in next week for EGD.

## 2015-12-25 ENCOUNTER — Other Ambulatory Visit (INDEPENDENT_AMBULATORY_CARE_PROVIDER_SITE_OTHER): Payer: Self-pay | Admitting: *Deleted

## 2015-12-25 ENCOUNTER — Encounter (INDEPENDENT_AMBULATORY_CARE_PROVIDER_SITE_OTHER): Payer: Self-pay | Admitting: *Deleted

## 2015-12-25 ENCOUNTER — Telehealth (INDEPENDENT_AMBULATORY_CARE_PROVIDER_SITE_OTHER): Payer: Self-pay | Admitting: Internal Medicine

## 2015-12-25 DIAGNOSIS — R110 Nausea: Secondary | ICD-10-CM

## 2015-12-25 NOTE — Telephone Encounter (Signed)
EGD sch'd 01/02/16, patient aware

## 2015-12-25 NOTE — Telephone Encounter (Signed)
Abigail Butts from Dr. Lance Sell office left a message saying Dr. Wolfgang Phoenix wants [place] to know Ms. Whittinghill is scheduled for a HIDA scan on March 27th. He knew she also needs an EGD and wants to make sure both scans aren't scheduled the same day. Please call Abigail Butts if needed.  [name] ph# [phone] Thank you.

## 2015-12-30 ENCOUNTER — Encounter (HOSPITAL_COMMUNITY): Payer: Self-pay

## 2015-12-30 ENCOUNTER — Encounter (HOSPITAL_COMMUNITY)
Admission: RE | Admit: 2015-12-30 | Discharge: 2015-12-30 | Disposition: A | Discharge status: Home / Self Care | Payer: Medicare Other | Source: Ambulatory Visit | Attending: Family Medicine | Admitting: Family Medicine

## 2015-12-30 DIAGNOSIS — R109 Unspecified abdominal pain: Secondary | ICD-10-CM | POA: Diagnosis not present

## 2015-12-30 DIAGNOSIS — R1013 Epigastric pain: Secondary | ICD-10-CM | POA: Diagnosis not present

## 2015-12-30 DIAGNOSIS — R110 Nausea: Secondary | ICD-10-CM | POA: Diagnosis not present

## 2015-12-30 DIAGNOSIS — R1033 Periumbilical pain: Secondary | ICD-10-CM | POA: Insufficient documentation

## 2015-12-30 MED ORDER — SODIUM CHLORIDE 0.9% FLUSH
INTRAVENOUS | Status: AC
  Filled 2015-12-30: qty 80

## 2015-12-30 MED ORDER — STERILE WATER FOR INJECTION IJ SOLN
INTRAMUSCULAR | Status: AC
  Administered 2015-12-30: 1.27 mL via INTRAVENOUS
  Filled 2015-12-30: qty 10

## 2015-12-30 MED ORDER — TECHNETIUM TC 99M MEBROFENIN IV KIT
5.0000 | PACK | Freq: Once | INTRAVENOUS | Status: AC | PRN
  Administered 2015-12-30: 5.1 via INTRAVENOUS

## 2015-12-30 MED ORDER — SINCALIDE 5 MCG IJ SOLR
INTRAMUSCULAR | Status: AC
  Administered 2015-12-30: 1.27 ug via INTRAVENOUS
  Filled 2015-12-30: qty 5

## 2015-12-31 DIAGNOSIS — R110 Nausea: Secondary | ICD-10-CM | POA: Diagnosis not present

## 2015-12-31 DIAGNOSIS — R1013 Epigastric pain: Secondary | ICD-10-CM | POA: Diagnosis not present

## 2015-12-31 DIAGNOSIS — R1033 Periumbilical pain: Secondary | ICD-10-CM | POA: Diagnosis not present

## 2016-01-01 LAB — HEPATIC FUNCTION PANEL
ALT: 12 IU/L (ref 0–32)
AST: 18 IU/L (ref 0–40)
Albumin: 3.5 g/dL (ref 3.5–4.7)
Alkaline Phosphatase: 70 IU/L (ref 39–117)
Bilirubin Total: 0.3 mg/dL (ref 0.0–1.2)
Bilirubin, Direct: 0.09 mg/dL (ref 0.00–0.40)
Total Protein: 5.3 g/dL — ABNORMAL LOW (ref 6.0–8.5)

## 2016-01-01 LAB — BASIC METABOLIC PANEL
BUN / CREAT RATIO: 13 (ref 11–26)
BUN: 14 mg/dL (ref 8–27)
CO2: 24 mmol/L (ref 18–29)
Calcium: 9.2 mg/dL (ref 8.7–10.3)
Chloride: 106 mmol/L (ref 96–106)
Creatinine, Ser: 1.08 mg/dL — ABNORMAL HIGH (ref 0.57–1.00)
GFR, EST AFRICAN AMERICAN: 56 mL/min/{1.73_m2} — AB (ref >59)
GFR, EST NON AFRICAN AMERICAN: 49 mL/min/{1.73_m2} — AB (ref >59)
Glucose: 94 mg/dL (ref 65–99)
POTASSIUM: 3.9 mmol/L (ref 3.5–5.2)
SODIUM: 144 mmol/L (ref 134–144)

## 2016-01-01 LAB — CORTISOL: Cortisol: 20.8 ug/dL

## 2016-01-02 ENCOUNTER — Ambulatory Visit (HOSPITAL_COMMUNITY): Payer: Medicare Other

## 2016-01-02 ENCOUNTER — Other Ambulatory Visit (HOSPITAL_COMMUNITY): Payer: Medicare Other

## 2016-01-02 ENCOUNTER — Encounter (HOSPITAL_COMMUNITY): Payer: Self-pay

## 2016-01-02 ENCOUNTER — Ambulatory Visit (HOSPITAL_COMMUNITY)
Admission: RE | Admit: 2016-01-02 | Discharge: 2016-01-02 | Disposition: A | Discharge status: Home / Self Care | Payer: Medicare Other | Source: Ambulatory Visit | Attending: Internal Medicine | Admitting: Internal Medicine

## 2016-01-02 ENCOUNTER — Telehealth: Payer: Self-pay | Admitting: Family Medicine

## 2016-01-02 ENCOUNTER — Encounter (HOSPITAL_COMMUNITY)
Admission: RE | Disposition: A | Discharge status: Home / Self Care | Payer: Self-pay | Source: Ambulatory Visit | Attending: Internal Medicine

## 2016-01-02 DIAGNOSIS — I10 Essential (primary) hypertension: Secondary | ICD-10-CM | POA: Diagnosis not present

## 2016-01-02 DIAGNOSIS — F419 Anxiety disorder, unspecified: Secondary | ICD-10-CM | POA: Insufficient documentation

## 2016-01-02 DIAGNOSIS — R630 Anorexia: Secondary | ICD-10-CM | POA: Insufficient documentation

## 2016-01-02 DIAGNOSIS — Z950 Presence of cardiac pacemaker: Secondary | ICD-10-CM | POA: Insufficient documentation

## 2016-01-02 DIAGNOSIS — F329 Major depressive disorder, single episode, unspecified: Secondary | ICD-10-CM | POA: Diagnosis not present

## 2016-01-02 DIAGNOSIS — Z7901 Long term (current) use of anticoagulants: Secondary | ICD-10-CM | POA: Diagnosis not present

## 2016-01-02 DIAGNOSIS — R7303 Prediabetes: Secondary | ICD-10-CM | POA: Insufficient documentation

## 2016-01-02 DIAGNOSIS — R634 Abnormal weight loss: Secondary | ICD-10-CM | POA: Diagnosis not present

## 2016-01-02 DIAGNOSIS — I4891 Unspecified atrial fibrillation: Secondary | ICD-10-CM | POA: Diagnosis not present

## 2016-01-02 DIAGNOSIS — K630 Abscess of intestine: Secondary | ICD-10-CM | POA: Diagnosis not present

## 2016-01-02 DIAGNOSIS — Z79899 Other long term (current) drug therapy: Secondary | ICD-10-CM | POA: Insufficient documentation

## 2016-01-02 DIAGNOSIS — R110 Nausea: Secondary | ICD-10-CM | POA: Insufficient documentation

## 2016-01-02 DIAGNOSIS — K3189 Other diseases of stomach and duodenum: Secondary | ICD-10-CM | POA: Diagnosis not present

## 2016-01-02 DIAGNOSIS — M1991 Primary osteoarthritis, unspecified site: Secondary | ICD-10-CM | POA: Insufficient documentation

## 2016-01-02 HISTORY — PX: ESOPHAGOGASTRODUODENOSCOPY: SHX5428

## 2016-01-02 SURGERY — EGD (ESOPHAGOGASTRODUODENOSCOPY)
Anesthesia: Moderate Sedation

## 2016-01-02 MED ORDER — MIDAZOLAM HCL 5 MG/5ML IJ SOLN
INTRAMUSCULAR | Status: DC | PRN
Start: 2016-01-02 — End: 2016-01-02
  Administered 2016-01-02: 2 mg via INTRAVENOUS
  Administered 2016-01-02 (×2): 1 mg via INTRAVENOUS
  Administered 2016-01-02: 2 mg via INTRAVENOUS

## 2016-01-02 MED ORDER — BUTAMBEN-TETRACAINE-BENZOCAINE 2-2-14 % EX AERO
INHALATION_SPRAY | CUTANEOUS | Status: DC | PRN
  Administered 2016-01-02: 2 via TOPICAL

## 2016-01-02 MED ORDER — STERILE WATER FOR IRRIGATION IR SOLN
Status: DC | PRN
  Administered 2016-01-02: 12:00:00

## 2016-01-02 MED ORDER — MEPERIDINE HCL 50 MG/ML IJ SOLN
INTRAMUSCULAR | Status: DC | PRN
  Administered 2016-01-02 (×2): 25 mg via INTRAVENOUS

## 2016-01-02 MED ORDER — SODIUM CHLORIDE 0.9 % IV SOLN
INTRAVENOUS | Status: DC
  Administered 2016-01-02: 12:00:00 via INTRAVENOUS

## 2016-01-02 MED ORDER — MIDAZOLAM HCL 5 MG/5ML IJ SOLN
INTRAMUSCULAR | Status: AC
  Filled 2016-01-02: qty 10

## 2016-01-02 MED ORDER — MEPERIDINE HCL 50 MG/ML IJ SOLN
INTRAMUSCULAR | Status: AC
  Filled 2016-01-02: qty 1

## 2016-01-02 NOTE — Op Note (Signed)
[facility] Patient Name: Kelsey Sandoval Procedure Date: 01/02/2016 11:52 AM MRN: [id number] Date of Birth: Jan 14, 1933 Attending MD: Hildred Laser , MD CSN: [id number] Age: 82 Admit Type: Outpatient Procedure:                Upper GI endoscopy Indications:              Anorexia, Nausea, Weight loss Providers:                Hildred Laser, MD, Renda Rolls, RN, Bonnetta Barry,                            Technician Referring MD:             Elayne Snare Luking (Referring MD) Medicines:                Cetacaine spray, Meperidine 50 mg IV, Midazolam 6                            mg IV Complications:            No immediate complications. Estimated Blood Loss:     Estimated blood loss: none. Procedure:                Pre-Anesthesia Assessment:                           - Prior to the procedure, a History and Physical                            was performed, and patient medications and                            allergies were reviewed. The patient's tolerance of                            previous anesthesia was also reviewed. The risks                            and benefits of the procedure and the sedation                            options and risks were discussed with the patient.                            All questions were answered, and informed consent                            was obtained. Prior Anticoagulants: The patient                            last took Pradaxa (dabigatran) 3 days prior to the                            procedure. ASA Grade Assessment: III - A patient  with severe systemic disease. After reviewing the                            risks and benefits, the patient was deemed in                            satisfactory condition to undergo the procedure.                           After obtaining informed consent, the endoscope was                            passed under direct vision. Throughout the                            procedure, the  patient's blood pressure, pulse, and                            oxygen saturations were monitored continuously. The                            [id number]) was introduced through the                            mouth, and advanced to the second part of duodenum.                            The upper GI endoscopy was accomplished without                            difficulty. The patient tolerated the procedure                            well. Scope In: 12:15:49 PM Scope Out: 12:21:06 PM Total Procedure Duration: 0 hours 5 minutes 17 seconds  Findings:      The examined esophagus was normal.      Esophagogastric landmarks were identified: the gastroesophageal junction       was found at 39 cm from the incisors.      Patchy mildly erythematous mucosa without bleeding was found in the       gastric antrum.      intestinal metaplasia involving prepyloric and pyloric channel mucosa.      The duodenal bulb and second portion of the duodenum were normal. Impression:               - Normal esophagus.                           - Esophagogastric landmarks identified.                           - Erythematous mucosa in the antrum.                           - Normal duodenal bulb and second portion of the  duodenum.                           - No specimens collected. Moderate Sedation:      Moderate (conscious) sedation was administered by the endoscopy nurse       and supervised by the endoscopist. The following parameters were       monitored: oxygen saturation, heart rate, blood pressure, CO2       capnography and response to care. Total physician intraservice time was       16 minutes. Recommendation:           - Patient has a contact number available for                            emergencies. The signs and symptoms of potential                            delayed complications were discussed with the                            patient. Return to normal  activities tomorrow.                            Written discharge instructions were provided to the                            patient.                           - Resume previous diet today.                           - Continue present medications.                           - Resume Pradaxa (dabigatran) at prior dose today.                            Refer to primary physician for further adjustment                            of therapy.                           - Return to primary care physician in 4 weeks. Procedure Code(s):        --- Professional ---                           [phone], Esophagogastroduodenoscopy, flexible,                            transoral; diagnostic, including collection of                            specimen(s) by brushing or washing, when performed                            (  separate procedure)                           99152, Moderate sedation services provided by the                            same physician or other qualified health care                            professional performing the diagnostic or                            therapeutic service that the sedation supports,                            requiring the presence of an independent trained                            observer to assist in the monitoring of the                            patient's level of consciousness and physiological                            status; initial 15 minutes of intraservice time,                            patient age 30 years or older Diagnosis Code(s):        --- Professional ---                           K31.89, Other diseases of stomach and duodenum                           R63.0, Anorexia                           R11.0, Nausea                           R63.4, Abnormal weight loss CPT copyright 2016 American Medical Association. All rights reserved. The codes documented in this report are preliminary and upon coder review may  be revised to meet current  compliance requirements. Hildred Laser, MD Hildred Laser, MD 01/02/2016 12:33:37 PM This report has been signed electronically. Number of Addenda: 0

## 2016-01-02 NOTE — H&P (Addendum)
Kelsey Sandoval is an 82 y.o. female.   Chief Complaint: Patient is here for EGD. HPI: Patient is 82 year old Caucasian female with multiple medical problems who presents with several month history of frequent if not daily nausea and anorexia and 10 pound weight loss. He reports worsening symptoms since November 2016. She has noted abdominal gurgling but denies melena or rectal bleeding or diarrhea. She had HIDA scan by Dr. Nicki Reaper looking earlier this week and was within normal limits. Ultrasound in June 2016 was negative for cholelithiasis but She had normal gastric emptying study in June 2016. She had EGD in April 2015 for iron deficiency anemia revealing small sliding hiatal hernia and nonerosive antral gastritis. H. pylori serology was negative. He also had colonoscopy in January 2015 for the same reason. This exam revealed left-sided diverticulosis. Recent cortisol level was normal. Patient has been off Pradaxa for three days.  Past Medical History  Diagnosis Date  . Hypertension   . Dysrhythmia   . Anxiety   . Depression   . Pacemaker   . Arthritis   . Hyperlipidemia   . Pre-diabetes   . Osteopenia   . Atrial fibrillation ([place])   . Back pain, chronic   . Cataracts, bilateral   . Urine protein increased   . Nephrotic syndrome     RHUX  . Membranous nephropathy determined by biopsy 06/07/2015    Past Surgical History  Procedure Laterality Date  . Insert / replace / remove pacemaker    . Appendectomy    . Abdominal hysterectomy      partial-pt has no ovaries  . Back surgery    . Colonoscopy  9/05  . Dual chamber pacemaker      2012  . Colonoscopy N/A 10/11/2013    Procedure: COLONOSCOPY;  Surgeon: Rogene Houston, MD;  [facility];  Service: Endoscopy;  Laterality: N/A;  1200  . Esophagogastroduodenoscopy N/A 01/17/2014    Procedure: ESOPHAGOGASTRODUODENOSCOPY (EGD);  Surgeon: Rogene Houston, MD;  [facility];  Service: Endoscopy;  Laterality: N/A;  230   . Biopsy N/A 01/17/2014    Procedure: BIOPSY;  Surgeon: Rogene Houston, MD;  [facility];  Service: Endoscopy;  Laterality: N/A;  . Wisdom tooth extracton    . Cataract extraction w/phaco Left 02/28/2015    Procedure: CATARACT EXTRACTION PHACO AND INTRAOCULAR LENS PLACEMENT (IOC);  Surgeon: [name], MD;  Location: [facility];  Service: Ophthalmology;  Laterality: Left;  CDE 18.71    Family History  Problem Relation Age of Onset  . Colon cancer Brother    Social History:  reports that she has never smoked. She has never used smokeless tobacco. She reports that she does not drink alcohol or use illicit drugs.  Allergies:  Allergies  Allergen Reactions  . Penicillins Other (See Comments)    Caused patient to pass out.Can take cephalosporins Has patient had a PCN reaction causing immediate rash, facial/tongue/throat swelling, SOB or lightheadedness with hypotension: no Has patient had a PCN reaction causing severe rash involving mucus membranes or skin necrosis: No Has patient had a PCN reaction that required hospitalization No Has patient had a PCN reaction occurring within the last 10 years: No If all of the above answers are "NO", then may proceed with Cephalosporin use.   . Levaquin [Levofloxacin] Nausea Only  . Sulfa Antibiotics Other (See Comments)    Unknown  . Zithromax [Azithromycin] Nausea Only and Rash    Medications Prior to Admission  Medication Sig Dispense Refill  . albuterol (PROVENTIL HFA;VENTOLIN HFA) 108 (90 BASE) MCG/ACT inhaler Inhale 2 puffs into the lungs every 6 (six) hours as needed for wheezing or shortness of breath. 18 g 5  . ALPRAZolam (XANAX) 1 MG tablet TAKE ONE TABLET BY MOUTH TWICE DAILY AS NEEDED FOR ANXIETY 60 tablet 4  . amLODipine (NORVASC) 10 MG tablet TAKE ONE TABLET BY MOUTH ONCE DAILY 30 tablet 3  . flecainide (TAMBOCOR) 50 MG tablet TAKE ONE TABLET BY MOUTH TWICE DAILY 180 tablet 3  . gabapentin (NEURONTIN) 100 MG capsule Take 2  capsules (200 mg total) by mouth 2 (two) times daily. 120 capsule 5  . HYDROcodone-acetaminophen (NORCO/VICODIN) 5-325 MG tablet Take 1 tablet by mouth every 4 (four) hours as needed for moderate pain. 120 tablet 0  . levothyroxine (SYNTHROID, LEVOTHROID) 50 MCG tablet TAKE ONE TABLET BY MOUTH ONCE DAILY **CHANGE  IN  DOSE** 30 tablet 5  . lisinopril (PRINIVIL,ZESTRIL) 40 MG tablet Take 1 tablet (40 mg total) by mouth daily. 30 tablet 12  . metoprolol succinate (TOPROL-XL) 50 MG 24 hr tablet TAKE ONE TABLET BY MOUTH ONCE DAILY WITH  OR  IMMEDIATELY  FOLLOWING  A  MEAL 30 tablet 12  . ondansetron (ZOFRAN-ODT) 8 MG disintegrating tablet Take 1 tablet (8 mg total) by mouth every 8 (eight) hours as needed for nausea or vomiting. 30 tablet 3  . pantoprazole (PROTONIX) 40 MG tablet Take 1 tablet (40 mg total) by mouth daily. 30 tablet 5  . pravastatin (PRAVACHOL) 80 MG tablet Take 1 tablet (80 mg total) by mouth daily. 30 tablet 12  . riTUXimab in sodium chloride 0.9 % 250 mL Infusion 2 weeks apart: May repeat in 6 months. Follow up with primary care doctor prior to initiation of rituximab for infectious clearance  0  . sertraline (ZOLOFT) 100 MG tablet Take 1 tablet (100 mg total) by mouth daily. 30 tablet 5  . sucralfate (CARAFATE) 1 GM/10ML suspension Take 10cc by mouth four times a day. 420 mL 0  . vitamin B-12 (CYANOCOBALAMIN) 1000 MCG tablet Take 1 tablet (1,000 mcg total) by mouth daily.    . dabigatran (PRADAXA) 150 MG CAPS capsule Take 1 capsule (150 mg total) by mouth 2 (two) times daily. 180 capsule 6    No results found for this or any previous visit (from the past 48 hour(s)). No results found.  ROS  Blood pressure 183/87, pulse 83, temperature 98.6 F (37 C), temperature source Oral, resp. rate 21, SpO2 95 %. Physical Exam  Constitutional: She appears well-developed and well-nourished.  HENT:  Mouth/Throat: Oropharynx is clear and moist.  Eyes: Conjunctivae are normal.  Neck: No  thyromegaly present.  Cardiovascular: Normal rate, regular rhythm and normal heart sounds.   No murmur heard. Respiratory: Effort normal and breath sounds normal.  GI: Soft. She exhibits no distension and no mass. There is no tenderness.  Musculoskeletal: She exhibits no edema.  Lymphadenopathy:    She has no cervical adenopathy.  Neurological: She is alert.  Skin: Skin is warm and dry.     Assessment/Plan Nausea anorexia and weight loss. Diagnostic EGD.  Rogene Houston, MD 01/02/2016, 11:58 AM

## 2016-01-02 NOTE — Telephone Encounter (Signed)
Please complete the Home Health Certification sent back in red folder.  Please fax back to [phone].

## 2016-01-02 NOTE — Discharge Instructions (Signed)
Resume usual medications including Pradaxa and diet. No driving for 24 hours. Follow-up with Dr. Sallee Lange    Esophagogastroduodenoscopy, Care After Refer to this sheet in the next few weeks. These instructions provide you with information about caring for yourself after your procedure. Your health care provider may also give you more specific instructions. Your treatment has been planned according to current medical practices, but problems sometimes occur. Call your health care provider if you have any problems or questions after your procedure. WHAT TO EXPECT AFTER THE PROCEDURE After your procedure, it is typical to feel:  Soreness in your throat.  Pain with swallowing.  Sick to your stomach (nauseous).  Bloated.  Dizzy.  Fatigued. HOME CARE INSTRUCTIONS  Do not eat or drink anything until the numbing medicine (local anesthetic) has worn off and your gag reflex has returned. You will know that the local anesthetic has worn off when you can swallow comfortably.  Do not drive or operate machinery until directed by your health care provider.  Take medicines only as directed by your health care provider. SEEK MEDICAL CARE IF:   You cannot stop coughing.  You are not urinating at all or less than usual. SEEK IMMEDIATE MEDICAL CARE IF:  You have difficulty swallowing.  You cannot eat or drink.  You have worsening throat or chest pain.  You have dizziness or lightheadedness or you faint.  You have nausea or vomiting.  You have chills.  You have a fever.  You have severe abdominal pain.  You have black, tarry, or bloody stools.   This information is not intended to replace advice given to you by your health care provider. Make sure you discuss any questions you have with your health care provider.   Document Released: 09/07/2012 Document Revised: 10/12/2014 Document Reviewed: 09/07/2012 Elsevier Interactive Patient Education [employer].

## 2016-01-06 ENCOUNTER — Encounter (HOSPITAL_COMMUNITY): Payer: Self-pay | Admitting: Internal Medicine

## 2016-01-07 ENCOUNTER — Ambulatory Visit (INDEPENDENT_AMBULATORY_CARE_PROVIDER_SITE_OTHER): Payer: Medicare Other | Admitting: Family Medicine

## 2016-01-07 ENCOUNTER — Encounter: Payer: Self-pay | Admitting: Family Medicine

## 2016-01-07 VITALS — BP 130/86 | Ht 70.0 in | Wt 156.4 lb

## 2016-01-07 DIAGNOSIS — R531 Weakness: Secondary | ICD-10-CM

## 2016-01-07 DIAGNOSIS — D510 Vitamin B12 deficiency anemia due to intrinsic factor deficiency: Secondary | ICD-10-CM

## 2016-01-07 DIAGNOSIS — M791 Myalgia, unspecified site: Secondary | ICD-10-CM

## 2016-01-07 DIAGNOSIS — R110 Nausea: Secondary | ICD-10-CM | POA: Diagnosis not present

## 2016-01-07 DIAGNOSIS — M2550 Pain in unspecified joint: Secondary | ICD-10-CM

## 2016-01-07 MED ORDER — LEVOTHYROXINE SODIUM 50 MCG PO TABS: ORAL_TABLET | ORAL | Status: DC

## 2016-01-07 MED ORDER — GABAPENTIN 100 MG PO CAPS: 200.0000 mg | ORAL_CAPSULE | Freq: Two times a day (BID) | ORAL | Status: DC

## 2016-01-07 MED ORDER — SERTRALINE HCL 100 MG PO TABS: 150.0000 mg | ORAL_TABLET | Freq: Every day | ORAL | Status: DC

## 2016-01-07 MED ORDER — ALPRAZOLAM 1 MG PO TABS: ORAL_TABLET | ORAL | Status: DC

## 2016-01-07 NOTE — Patient Instructions (Addendum)
Do additional lab test  See endocrinologist later in April 20th at 3 pm - Dr Dorris Fetch- to check your adrenal glands  Increase Zoloft to 1 and 1/2 daily please  Recheck here in 4 months  We will try to see if Behavioral health can see you sooner

## 2016-01-07 NOTE — Progress Notes (Signed)
   Subjective:    Patient ID: Kelsey Sandoval, female    DOB: April 18, 1933, 82 y.o.   MRN: [id number]  HPI Patient is here today for a follow up on dyspepsia. Patient states that she has slightly improved since her last visit. Patient is still unable to eat, has nausea and is extremely fatigue.  Patient has bilateral knee, lower leg and foot pain.   patient relates moderate nausea comes and goes. Takes her medicine as directed. Denies high fever chills sweats. States her energy level is subpar. She relates being depressed and feeling down. She at times doesn't feel like she should keep going but she denies being suicidal. Review of Systems  please see above    Objective:   Physical Exam   lungs clear heart rate is controlled extremities no edema skin warm dry pulses normal      Assessment & Plan:   patient is very concerned that protonix  Is causing significant problems and side effects she does not want to take in any further severe for we will stop it when she sees gastroenterology they will see if they need to put her on H2 blocker or something different  Persistent nausea -we will go ahead and do some additional testing because of her body aches and feeling bad we will try to help rule out lupus and because of her history of pernicious anemia look at her B12 level plus also sedimentation rate.   Patient will follow-up in several weeks increase the dose of her antidepressant to see if that will help , we will try to get her in with mental health sooner if they are able to

## 2016-01-07 NOTE — Telephone Encounter (Signed)
I believe this was completed

## 2016-01-08 LAB — ANA: Anti Nuclear Antibody(ANA): NEGATIVE

## 2016-01-08 LAB — SEDIMENTATION RATE: Sed Rate: 9 mm/hr (ref 0–40)

## 2016-01-08 LAB — C-REACTIVE PROTEIN: CRP: 1.5 mg/L (ref 0.0–4.9)

## 2016-01-08 LAB — CK: CK TOTAL: 66 U/L (ref 24–173)

## 2016-01-08 LAB — VITAMIN B12: VITAMIN B 12: 901 pg/mL (ref 211–946)

## 2016-01-09 ENCOUNTER — Telehealth: Payer: Self-pay | Admitting: Family Medicine

## 2016-01-09 ENCOUNTER — Ambulatory Visit (INDEPENDENT_AMBULATORY_CARE_PROVIDER_SITE_OTHER): Payer: Medicare Other | Admitting: Internal Medicine

## 2016-01-09 NOTE — Telephone Encounter (Signed)
Ruby with Behavioral Health called to state that the patient is currently scheduled for 02/13/16 with Dr. Harrington Challenger and she's been placed on their cancellation list and could possibly be called to come in sooner.  02/13/16 was the first available appointment so they put the patient on their schedule and their cancellation list

## 2016-01-09 NOTE — Telephone Encounter (Signed)
Per Dr. Nicki Reaper - Please connect with behavioral health to see if they can get this patient in sooner her depression seems to be getting worse it's affecting her eating and causing her to lose weight.  [facility] on voicemail at Dr. Ferne Coe office

## 2016-01-14 ENCOUNTER — Encounter: Payer: Self-pay | Admitting: Family Medicine

## 2016-01-14 ENCOUNTER — Ambulatory Visit: Payer: Self-pay | Admitting: Orthopaedic Surgery

## 2016-01-14 ENCOUNTER — Ambulatory Visit (INDEPENDENT_AMBULATORY_CARE_PROVIDER_SITE_OTHER): Payer: Medicare Other | Admitting: Family Medicine

## 2016-01-14 VITALS — BP 152/94 | HR 91 | Temp 98.0°F | Ht 70.0 in | Wt 152.4 lb

## 2016-01-14 DIAGNOSIS — R531 Weakness: Secondary | ICD-10-CM

## 2016-01-14 DIAGNOSIS — R634 Abnormal weight loss: Secondary | ICD-10-CM | POA: Diagnosis not present

## 2016-01-14 DIAGNOSIS — R110 Nausea: Secondary | ICD-10-CM | POA: Diagnosis not present

## 2016-01-14 LAB — POCT URINALYSIS DIPSTICK
Blood, UA: NEGATIVE
GLUCOSE UA: NEGATIVE
Ketones, UA: NEGATIVE
Leukocytes, UA: NEGATIVE
NITRITE UA: NEGATIVE
Protein, UA: 500
UROBILINOGEN UA: NEGATIVE
pH, UA: 7

## 2016-01-14 MED ORDER — DULOXETINE HCL 20 MG PO CPEP: 40.0000 mg | ORAL_CAPSULE | Freq: Every day | ORAL | Status: DC

## 2016-01-14 NOTE — Progress Notes (Signed)
   Subjective:    Patient ID: Kelsey Sandoval, female    DOB: 04-27-1933, 82 y.o.   MRN: [id number]  Anxiety Presents for follow-up visit. Symptoms include excessive worry and nervous/anxious behavior. Primary symptoms comment: Chills, Sweats, Fatigue, Decrease appetite .   Treatments tried: Zoloft    Patient states over the past week his had some chills sweats fatigue decrease appetite not feeling good denies vomiting denies bloody stools. Denies severe abdominal pain does have urinary frequency. Patient states no other concern this visit.  Review of Systems  Psychiatric/Behavioral: The patient is nervous/anxious.   Patient relates nausea denies abdominal pain denies chest pain denies wheezing denies difficulty breathing denies dysuria does relate some urinary frequency had some chills last night the son thought that she had some fever last night. Patient relates she's felt cold most mornings over the past week     Objective:   Physical Exam Patient has lost 4 pounds since last visit Lungs are clear hearts regular abdomen soft extremities no edema skin warm dry neurologic grossly normal  25 minutes was spent with the patient. Greater than half the time was spent in discussion and answering questions and counseling regarding the issues that the patient came in for today.     Assessment & Plan:  With this patient's persistent nausea we will have her see endocrinology to make sure that there is not adrenal insufficiency. Patient has Arty seen gastroenterology did EGD we did HIDA tests we have tried nausea medicine is possible the nausea could be related to medication. Stop Zoloft. I do not feel she'll need to see endocrinology long-term unless a problem is detected Lab work ordered await the results of the CBC met 7 Depression with anxiety unfortunately mental health's backlogged with their appointments we are awaiting an appointment. Has an appointment in May and is on the cancellation list  we will try Cymbalta 20 mg this comes in generic 1 daily for the first 5 days then 2 daily recommend for the patient to follow-up with [place] in approximately 3 weeks  Weight loss I believe this is because of the patient's persistent nausea and poor appetite

## 2016-01-15 LAB — CBC WITH DIFFERENTIAL/PLATELET
BASOS ABS: 0 10*3/uL (ref 0.0–0.2)
BASOS: 0 %
EOS (ABSOLUTE): 0 10*3/uL (ref 0.0–0.4)
Eos: 0 %
Hematocrit: 37.2 % (ref 34.0–46.6)
Hemoglobin: 12.7 g/dL (ref 11.1–15.9)
IMMATURE GRANULOCYTES: 0 %
Immature Grans (Abs): 0 10*3/uL (ref 0.0–0.1)
Lymphocytes Absolute: 1.1 10*3/uL (ref 0.7–3.1)
Lymphs: 10 %
MCH: 31.8 pg (ref 26.6–33.0)
MCHC: 34.1 g/dL (ref 31.5–35.7)
MCV: 93 fL (ref 79–97)
Monocytes Absolute: 0.4 10*3/uL (ref 0.1–0.9)
Monocytes: 4 %
NEUTROS PCT: 86 %
Neutrophils Absolute: 9.4 10*3/uL — ABNORMAL HIGH (ref 1.4–7.0)
PLATELETS: 254 10*3/uL (ref 150–379)
RBC: 3.99 x10E6/uL (ref 3.77–5.28)
RDW: 13.9 % (ref 12.3–15.4)
WBC: 11 10*3/uL — AB (ref 3.4–10.8)

## 2016-01-15 LAB — BASIC METABOLIC PANEL
BUN/Creatinine Ratio: 14 (ref 12–28)
BUN: 15 mg/dL (ref 8–27)
CHLORIDE: 103 mmol/L (ref 96–106)
CO2: 20 mmol/L (ref 18–29)
Calcium: 9.4 mg/dL (ref 8.7–10.3)
Creatinine, Ser: 1.07 mg/dL — ABNORMAL HIGH (ref 0.57–1.00)
GFR calc Af Amer: 57 mL/min/{1.73_m2} — ABNORMAL LOW (ref >59)
GFR, EST NON AFRICAN AMERICAN: 49 mL/min/{1.73_m2} — AB (ref >59)
GLUCOSE: 113 mg/dL — AB (ref 65–99)
POTASSIUM: 3.8 mmol/L (ref 3.5–5.2)
SODIUM: 143 mmol/L (ref 134–144)

## 2016-01-23 ENCOUNTER — Encounter: Payer: Self-pay | Admitting: "Endocrinology

## 2016-01-23 ENCOUNTER — Ambulatory Visit (INDEPENDENT_AMBULATORY_CARE_PROVIDER_SITE_OTHER): Payer: Medicare Other | Admitting: "Endocrinology

## 2016-01-23 VITALS — BP 147/83 | HR 78 | Ht 70.0 in | Wt 158.0 lb

## 2016-01-23 DIAGNOSIS — E039 Hypothyroidism, unspecified: Secondary | ICD-10-CM | POA: Diagnosis not present

## 2016-01-23 NOTE — Progress Notes (Signed)
Subjective:    Patient ID: Kelsey Sandoval, female    DOB: January 01, 1933,    Past Medical History  Diagnosis Date  . Hypertension   . Dysrhythmia   . Anxiety   . Depression   . Pacemaker   . Arthritis   . Hyperlipidemia   . Pre-diabetes   . Osteopenia   . Atrial fibrillation ([place])   . Back pain, chronic   . Cataracts, bilateral   . Urine protein increased   . Nephrotic syndrome     RHUX  . Membranous nephropathy determined by biopsy 06/07/2015   Past Surgical History  Procedure Laterality Date  . Insert / replace / remove pacemaker    . Appendectomy    . Abdominal hysterectomy      partial-pt has no ovaries  . Back surgery    . Colonoscopy  9/05  . Dual chamber pacemaker      2012  . Colonoscopy N/A 10/11/2013    Procedure: COLONOSCOPY;  Surgeon: Kelsey Houston, MD;  [facility];  Service: Endoscopy;  Laterality: N/A;  1200  . Esophagogastroduodenoscopy N/A 01/17/2014    Procedure: ESOPHAGOGASTRODUODENOSCOPY (EGD);  Surgeon: Kelsey Houston, MD;  [facility];  Service: Endoscopy;  Laterality: N/A;  230  . Biopsy N/A 01/17/2014    Procedure: BIOPSY;  Surgeon: Kelsey Houston, MD;  [facility];  Service: Endoscopy;  Laterality: N/A;  . Wisdom tooth extracton    . Cataract extraction w/phaco Left 02/28/2015    Procedure: CATARACT EXTRACTION PHACO AND INTRAOCULAR LENS PLACEMENT (IOC);  Surgeon: Kelsey Branch, MD;  Location: [facility];  Service: Ophthalmology;  Laterality: Left;  CDE 18.71  . Esophagogastroduodenoscopy N/A 01/02/2016    Procedure: ESOPHAGOGASTRODUODENOSCOPY (EGD);  Surgeon: Kelsey Houston, MD;  [facility];  Service: Endoscopy;  Laterality: N/A;  240   Social History   Social History  . Marital Status: Divorced    Spouse Name: N/A  . Number of Children: N/A  . Years of Education: N/A   Social History Main Topics  . Smoking status: Never Smoker   . Smokeless tobacco: Never Used  . Alcohol Use: No  . Drug Use: No   . Sexual Activity: Not Asked   Other Topics Concern  . None   Social History Narrative   Outpatient Encounter Prescriptions as of 01/23/2016  Medication Sig  . albuterol (PROVENTIL HFA;VENTOLIN HFA) 108 (90 BASE) MCG/ACT inhaler Inhale 2 puffs into the lungs every 6 (six) hours as needed for wheezing or shortness of breath.  . ALPRAZolam (XANAX) 1 MG tablet 1/2 to 1 bid prn anxiety  . amLODipine (NORVASC) 10 MG tablet TAKE ONE TABLET BY MOUTH ONCE DAILY  . dabigatran (PRADAXA) 150 MG CAPS capsule Take 1 capsule (150 mg total) by mouth 2 (two) times daily.  . DULoxetine (CYMBALTA) 20 MG capsule Take 2 capsules (40 mg total) by mouth daily.  . flecainide (TAMBOCOR) 50 MG tablet TAKE ONE TABLET BY MOUTH TWICE DAILY  . gabapentin (NEURONTIN) 100 MG capsule Take 2 capsules (200 mg total) by mouth 2 (two) times daily.  Marland Kitchen HYDROcodone-acetaminophen (NORCO/VICODIN) 5-325 MG tablet Take 1 tablet by mouth every 4 (four) hours as needed for moderate pain.  Marland Kitchen levothyroxine (SYNTHROID, LEVOTHROID) 50 MCG tablet TAKE ONE TABLET BY MOUTH ONCE DAILY **CHANGE  IN  DOSE**  . lisinopril (PRINIVIL,ZESTRIL) 40 MG tablet Take 1 tablet (40 mg total) by mouth daily.  . metoprolol succinate (TOPROL-XL) 50  MG 24 hr tablet TAKE ONE TABLET BY MOUTH ONCE DAILY WITH  OR  IMMEDIATELY  FOLLOWING  A  MEAL  . ondansetron (ZOFRAN-ODT) 8 MG disintegrating tablet Take 1 tablet (8 mg total) by mouth every 8 (eight) hours as needed for nausea or vomiting.  . pravastatin (PRAVACHOL) 80 MG tablet Take 1 tablet (80 mg total) by mouth daily.  . riTUXimab in sodium chloride 0.9 % 250 mL Infusion 2 weeks apart: May repeat in 6 months. Follow up with primary care doctor prior to initiation of rituximab for infectious clearance  . sucralfate (CARAFATE) 1 GM/10ML suspension Take 10cc by mouth four times a day.  . vitamin B-12 (CYANOCOBALAMIN) 1000 MCG tablet Take 1 tablet (1,000 mcg total) by mouth daily.   No facility-administered  encounter medications on file as of 01/23/2016.   ALLERGIES: Allergies  Allergen Reactions  . Penicillins Other (See Comments)    Caused patient to pass out.Can take cephalosporins Has patient had a PCN reaction causing immediate rash, facial/tongue/throat swelling, SOB or lightheadedness with hypotension: no Has patient had a PCN reaction causing severe rash involving mucus membranes or skin necrosis: No Has patient had a PCN reaction that required hospitalization No Has patient had a PCN reaction occurring within the last 10 years: No If all of the above answers are "NO", then may proceed with Cephalosporin use.   . Levaquin [Levofloxacin] Nausea Only  . Sulfa Antibiotics Other (See Comments)    Unknown  . Zithromax [Azithromycin] Nausea Only and Rash   VACCINATION STATUS: Immunization History  Administered Date(s) Administered  . Influenza Split 07/11/2013  . Influenza,inj,Quad PF,36+ Mos 06/19/2014, 07/04/2015  . Pneumococcal Conjugate-13 03/29/2014  . Zoster 10/05/2010    HPI Kelsey Sandoval is 82 year old female patient with multiple medical problems. She is being seen in Follow-up after being seen in consultation for concern of adrenal insufficiency . -She is not on any steroid supplement. Her repeat a.m. cortisol shows better reading of 20 g. She has hypothyroidism on thyroid hormone, hypertension, hyperlipidemia, iron deficiency anemia on intermittent iron infusion. -She is also on multiple antidepressants.  She  still complains of  Weakness, poor appetite.  She denies recent exposure to high-dose steroids. She is on bronchodilator due to asthma. She denies history of smoking, she denies abdominal injury or systemic infections. She denies family history of adrenal, pituitary dysfunctions.  Review of Systems  Constitutional: no weight gain/loss, + fatigue, no subjective hyperthermia/hypothermia Eyes: no blurry vision, no xerophthalmia ENT: no sore throat, no nodules palpated  in throat, no dysphagia/odynophagia, no hoarseness Cardiovascular: no CP/SOB/palpitations/leg swelling Respiratory: no cough/SOB Gastrointestinal: no N/V/D/C Musculoskeletal: no muscle/joint aches Skin: no rashes Neurological: no tremors/numbness/tingling/dizziness Psychiatric: no depression/anxiety   Objective:    BP 147/83 mmHg  Pulse 78  Ht 5\' 10"  (1.778 m)  Wt 158 lb (71.668 kg)  BMI 22.67 kg/m2  SpO2 98%  Wt Readings from Last 3 Encounters:  01/23/16 158 lb (71.668 kg)  01/14/16 152 lb 6 oz (69.117 kg)  01/07/16 156 lb 6 oz (70.931 kg)    Physical Exam  Constitutional:  in NAD Eyes: PERRLA, EOMI, no exophthalmos ENT: moist mucous membranes, no thyromegaly, no cervical lymphadenopathy Cardiovascular: RRR, No MRG Respiratory: CTA B Gastrointestinal: abdomen soft, NT, ND, BS+ Musculoskeletal: no deformities, strength intact in all 4 Skin: moist, warm, no rashes Neurological: no tremor with outstretched hands, DTR normal in all 4  Results for orders placed or performed in visit on [id number]  Basic metabolic panel  Result  Value Ref Range   Glucose 113 (H) 65 - 99 mg/dL   BUN 15 8 - 27 mg/dL   Creatinine, Ser 1.07 (H) 0.57 - 1.00 mg/dL   GFR calc non Af Amer 49 (L) >59 mL/min/1.73   GFR calc Af Amer 57 (L) >59 mL/min/1.73   BUN/Creatinine Ratio 14 12 - 28   Sodium 143 134 - 144 mmol/L   Potassium 3.8 3.5 - 5.2 mmol/L   Chloride 103 96 - 106 mmol/L   CO2 20 18 - 29 mmol/L   Calcium 9.4 8.7 - 10.3 mg/dL  CBC with Differential/Platelet  Result Value Ref Range   WBC 11.0 (H) 3.4 - 10.8 x10E3/uL   RBC 3.99 3.77 - 5.28 x10E6/uL   Hemoglobin 12.7 11.1 - 15.9 g/dL   Hematocrit 37.2 34.0 - 46.6 %   MCV 93 79 - 97 fL   MCH 31.8 26.6 - 33.0 pg   MCHC 34.1 31.5 - 35.7 g/dL   RDW 13.9 12.3 - 15.4 %   Platelets 254 150 - 379 x10E3/uL   Neutrophils 86 %   Lymphs 10 %   Monocytes 4 %   Eos 0 %   Basos 0 %   Neutrophils Absolute 9.4 (H) 1.4 - 7.0 x10E3/uL   Lymphocytes  Absolute 1.1 0.7 - 3.1 x10E3/uL   Monocytes Absolute 0.4 0.1 - 0.9 x10E3/uL   EOS (ABSOLUTE) 0.0 0.0 - 0.4 x10E3/uL   Basophils Absolute 0.0 0.0 - 0.2 x10E3/uL   Immature Granulocytes 0 %   Immature Grans (Abs) 0.0 0.0 - 0.1 x10E3/uL  POCT urinalysis dipstick  Result Value Ref Range   Color, UA Yellow    Clarity, UA Clear    Glucose, UA Negative    Bilirubin, UA 3+    Ketones, UA Negative    Spec Grav, UA <=1.005    Blood, UA Negative    pH, UA 7.0    Protein, UA 500    Urobilinogen, UA negative    Nitrite, UA Negative    Leukocytes, UA Negative Negative   Complete Blood Count (Most recent): Lab Results  Component Value Date   WBC 11.0* 01/14/2016   HGB 11.7* 12/18/2015   HCT 37.2 01/14/2016   MCV 93 01/14/2016   PLT 254 01/14/2016   Chemistry (most recent): Lab Results  Component Value Date   NA 143 01/14/2016   K 3.8 01/14/2016   CL 103 01/14/2016   CO2 20 01/14/2016   BUN 15 01/14/2016   CREATININE 1.07* 01/14/2016  Lipid profile (most recent): Lab Results  Component Value Date   TRIG 209* 11/25/2015   CHOL 187 11/25/2015       Assessment & Plan:   1. Fatigue -  Her adrenal function is normal, no suspicion for adrenal insufficiency. -Her history is remarkable for poor dietary intake made worse by mood disorder including major depression. I have discussed dietary options with her to increase her protein intake without too much restriction on her carb choices either. -Optimal treatment of her depression would also help. 2. Hypothyroidism, unspecified hypothyroidism type She is clinically euthyroid. She is compliant with her levothyroxine. Her thyroid function tests were consistent with appropriate replacement. I advised her to continue pneumothorax in 50 g by mouth every morning.  - We discussed about correct intake of levothyroxine, at fasting, with water, separated by at least 30 minutes from breakfast, and separated by more than 4 hours from calcium,  iron, multivitamins, acid reflux medications (PPIs). -Patient is made aware of  the fact that thyroid hormone replacement is needed for life, dose to be adjusted by periodic monitoring of thyroid function tests. I advised patient to maintain close follow up with their PCP for primary care needs.  Follow up plan: Return in about 6 months (around 07/24/2016) for follow up with pre-visit labs.  Glade Lloyd, MD Phone: [phone]  Fax: [phone]   01/23/2016, 3:08 PM

## 2016-01-28 ENCOUNTER — Encounter: Payer: Self-pay | Admitting: Family Medicine

## 2016-01-28 ENCOUNTER — Ambulatory Visit (INDEPENDENT_AMBULATORY_CARE_PROVIDER_SITE_OTHER): Payer: Medicare Other | Admitting: Family Medicine

## 2016-01-28 VITALS — BP 136/74 | Ht 70.0 in | Wt 157.0 lb

## 2016-01-28 DIAGNOSIS — R112 Nausea with vomiting, unspecified: Secondary | ICD-10-CM | POA: Diagnosis not present

## 2016-01-28 DIAGNOSIS — E038 Other specified hypothyroidism: Secondary | ICD-10-CM | POA: Diagnosis not present

## 2016-01-28 DIAGNOSIS — F332 Major depressive disorder, recurrent severe without psychotic features: Secondary | ICD-10-CM | POA: Diagnosis not present

## 2016-01-28 MED ORDER — LEVOTHYROXINE SODIUM 50 MCG PO TABS: ORAL_TABLET | ORAL | Status: DC

## 2016-01-28 MED ORDER — AMLODIPINE BESYLATE 10 MG PO TABS: 10.0000 mg | ORAL_TABLET | Freq: Every day | ORAL | Status: DC

## 2016-01-28 MED ORDER — ALPRAZOLAM 1 MG PO TABS: ORAL_TABLET | ORAL | Status: DC

## 2016-01-28 MED ORDER — PANTOPRAZOLE SODIUM 40 MG PO TBEC: 40.0000 mg | DELAYED_RELEASE_TABLET | Freq: Every day | ORAL | Status: DC

## 2016-01-28 NOTE — Progress Notes (Signed)
   Subjective:    Patient ID: Kelsey Sandoval, female    DOB: May 28, 1933, 82 y.o.   MRN: [id number]  HPIFollow up on depression. Started on cymbalta. Feeling better.   Needs refill on xanax takes one half bid.   lneds refill on levothyroxine.   Patient states she seems to be doing better energy level better dietary better denies any high fever chills sweats nausea vomiting diarrhea. States she's doing much better able to eat and keep things down well.  Review of Systems    see above. Objective:   Physical Exam Lungs are clear hearts regular pulse normal extremities no edema skin warm dry  15 minutes spent reassessing how she is doing. Time was also spent going over every single one of her medications    Assessment & Plan:  Xanax-patient uses a half tablet in the morning sometimes a half in the afternoon and 1 at night I believe this is reasonable she was given prescription regarding this  Pain medication she does not need prescription currently she will follow-up in early June for her appointment  Thyroid under good control refills sent in  Depression doing much better on current regimen stick with this  Nausea-resolved patient doing much better with eating to follow-up sooner if any problems

## 2016-02-06 ENCOUNTER — Ambulatory Visit (INDEPENDENT_AMBULATORY_CARE_PROVIDER_SITE_OTHER): Payer: Medicare Other | Admitting: Orthopaedic Surgery

## 2016-02-06 ENCOUNTER — Encounter: Payer: Self-pay | Admitting: Orthopaedic Surgery

## 2016-02-06 VITALS — BP 182/86 | HR 75 | Temp 97.0°F | Ht 70.0 in | Wt 157.6 lb

## 2016-02-06 DIAGNOSIS — I340 Nonrheumatic mitral (valve) insufficiency: Secondary | ICD-10-CM

## 2016-02-06 DIAGNOSIS — F325 Major depressive disorder, single episode, in full remission: Secondary | ICD-10-CM

## 2016-02-06 DIAGNOSIS — M25561 Pain in right knee: Secondary | ICD-10-CM

## 2016-02-06 DIAGNOSIS — M25562 Pain in left knee: Secondary | ICD-10-CM | POA: Diagnosis not present

## 2016-02-06 DIAGNOSIS — G894 Chronic pain syndrome: Secondary | ICD-10-CM

## 2016-02-06 DIAGNOSIS — Z950 Presence of cardiac pacemaker: Secondary | ICD-10-CM

## 2016-02-06 NOTE — Progress Notes (Signed)
Patient ID: Kelsey Sandoval, female   DOB: [birth year], 82 y.o.   MRN: [id number]  CC: Both of my knees are hurting. I would like an injection in both knees.  The patient has had chronic pain and tenderness of both knees for some time.  Injections help.  There is no locking or giving way of the knee.  There is no new trauma. There is no redness or signs of infections.  The knees have a mild effusion and some crepitus.  There is no redness or signs of recent trauma.  Impression:  Chronic pain of the both knees  Return:  Three months.  PROCEDURE NOTE:  The patient requests injections of the left knee , verbal consent was obtained.  The left knee was prepped appropriately after time out was performed.   Sterile technique was observed and injection of 1 cc of Depo-Medrol 40 mg with several cc's of plain xylocaine. Anesthesia was provided by ethyl chloride and a 20-gauge needle was used to inject the knee area. The injection was tolerated well.  A band aid dressing was applied.  The patient was advised to apply ice later today and tomorrow to the injection sight as needed.  PROCEDURE NOTE:  The patient requests injections of the right knee , verbal consent was obtained.  The right knee was prepped appropriately after time out was performed.   Sterile technique was observed and injection of 1 cc of Depo-Medrol 40 mg with several cc's of plain xylocaine. Anesthesia was provided by ethyl chloride and a 20-gauge needle was used to inject the knee area. The injection was tolerated well.  A band aid dressing was applied.  The patient was advised to apply ice later today and tomorrow to the injection sight as needed.

## 2016-02-13 ENCOUNTER — Encounter (HOSPITAL_COMMUNITY): Payer: Self-pay | Admitting: Psychiatry

## 2016-02-13 ENCOUNTER — Ambulatory Visit (INDEPENDENT_AMBULATORY_CARE_PROVIDER_SITE_OTHER): Payer: Medicare Other | Admitting: Psychiatry

## 2016-02-13 VITALS — BP 161/82 | HR 71 | Ht 70.0 in | Wt 158.0 lb

## 2016-02-13 DIAGNOSIS — F322 Major depressive disorder, single episode, severe without psychotic features: Secondary | ICD-10-CM | POA: Diagnosis not present

## 2016-02-13 MED ORDER — DULOXETINE HCL 60 MG PO CPEP: 60.0000 mg | ORAL_CAPSULE | Freq: Every day | ORAL | Status: DC
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
## 2016-02-25 DIAGNOSIS — N022 Recurrent and persistent hematuria with diffuse membranous glomerulonephritis: Secondary | ICD-10-CM | POA: Diagnosis not present

## 2016-02-25 DIAGNOSIS — Z79899 Other long term (current) drug therapy: Secondary | ICD-10-CM | POA: Diagnosis not present

## 2016-03-05 ENCOUNTER — Ambulatory Visit (INDEPENDENT_AMBULATORY_CARE_PROVIDER_SITE_OTHER): Payer: Medicare Other | Admitting: Family Medicine

## 2016-03-05 ENCOUNTER — Encounter: Payer: Self-pay | Admitting: Family Medicine

## 2016-03-05 VITALS — BP 122/82 | Ht 70.0 in | Wt 161.4 lb

## 2016-03-05 DIAGNOSIS — M170 Bilateral primary osteoarthritis of knee: Secondary | ICD-10-CM

## 2016-03-05 DIAGNOSIS — E038 Other specified hypothyroidism: Secondary | ICD-10-CM | POA: Diagnosis not present

## 2016-03-05 DIAGNOSIS — G894 Chronic pain syndrome: Secondary | ICD-10-CM

## 2016-03-05 DIAGNOSIS — G609 Hereditary and idiopathic neuropathy, unspecified: Secondary | ICD-10-CM | POA: Insufficient documentation

## 2016-03-05 DIAGNOSIS — F325 Major depressive disorder, single episode, in full remission: Secondary | ICD-10-CM

## 2016-03-05 DIAGNOSIS — I10 Essential (primary) hypertension: Secondary | ICD-10-CM | POA: Diagnosis not present

## 2016-03-05 MED ORDER — HYDROCODONE-ACETAMINOPHEN 5-325 MG PO TABS: 1.0000 | ORAL_TABLET | ORAL | Status: DC | PRN

## 2016-03-05 MED ORDER — GABAPENTIN 300 MG PO CAPS: 300.0000 mg | ORAL_CAPSULE | Freq: Three times a day (TID) | ORAL | Status: DC

## 2016-03-05 NOTE — Patient Instructions (Signed)
Narcotic medication treatment agreement-educational material and consent form. Your health problem-because you are having problems with pain you are being prescribed narcotic medication to help control your pain. The purpose of narcotic medication-narcotics are a type of drug that should help you with your pain and let you be more active in your daily life. It is not expected that your pain will go away completely. There are risks associated with these drugs and you can also have side effects. It is important for you to be honest with your doctor about your pain and the dose of medication you are taking. It is also important to be honest with your doctor about any potential problems or side effects with the medications that you are having. Risk and common problems-narcotic use has been associated with the following issues Addiction- there is a chance that you could become addicted to narcotic drugs. This means that you once the drug and will try very hard to get it, even if it causes you harm or other problems in your life. This chance is greater in people who are young, have mental illness, have been addicted to any drug in the past, or have a close relative that has been addicted to a drug in the past. Your doctor may tell you that you need tests or should see other health providers to help you avoid addiction. Allergic reaction - all kinds of allergic reactions can happen. You could have a minor reaction such as a rash or severe reaction such as swelling of your tongue or throat. A severe reaction as a medical emergency that can cause death. Incomplete relief of pain - narcotic medications do not take away all of your pain. Your doctor will work with you to try to optimize treatment but it is not reasonable to expect complete relief of pain. Low testosterone levels in men - narcotic drugs may cause the levels of the hormone testosterone to drop in men. This could change your mood and energy level. It may  also lessen the desire to have sex. Testosterone supplementation in this situation is not recommended. Physical dependence- you may not feel well if your dose is suddenly stopped. Some common symptoms of withdraw are runny nose, excessive yawning, goosebumps, nausea with stomach pains, diarrhea, body aches, and increased irritability. Side effects- there are many side effects of narcotic drugs. Constipation, nausea, vomiting, itching, dizziness are all potential side effects. Slowed breathing- excessive doses of narcotics can slow your breathing. Do not use other drugs or drink alcohol while taking narcotic drugs. This can cause accidental death. Follow directions on how the medication is prescribed. If you feel you are having problems then notify your doctor. Slowed reaction time- you may feel sleepy and be slow to react. If this happens, then you should not drive, use heavy machinery or guns, or be at unsafe heights, or be caring for someone else. Tolerance- your body could become use to the dose of narcotic drugs that your doctor tells you to take, and you may not get the same relief of pain that you had before. A higher dose may not help and could cause potential side effects. Increased risk of accidental death can occur due to the narcotic medication or side effects. If you are having any of the problems listed above you need to discuss these with your physician.  Other choices- you do not have to take narcotics. The decision to take narcotics for pain his ureters. There are other choices that you may choose.  There are several alternatives that may be helpful. These can be done in place of narcotics or in some cases along with narcotics. - Anti-inflammatories, antidepressants, and seizure medications can be helpful -Physical therapy, wearing a brace, surgical referral, home exercise regimens, could potentially help -Referral to a specialist-you may wish to see a specialist who specializes in pain  management -You can choose to do nothing and live with the pain you have -Your doctor will discuss with you your choices but the decision his ureters. How well any other treatment works will depend on your specific health problem. More facts-there may be local, steak, or federal laws that your doctor must follow with prescribing narcotic drugs. It is not clear if narcotic painkillers are good for you to take for a long period of time. You should discuss with your doctor often about the good and bad effects that these drugs may have on you.  You should not take narcotic drugs if you are pregnant. If you become pregnant notify your doctor right away. Narcotic drugs can raise a chance of having a miscarriage or having a baby born with a birth defect. Your baby can also be born addicted to the drug. Should you become pregnant you will have to discontinue narcotic use. Treatment agreement - by signing the consent form you agree that you understand the rules for taking narcotic drugs. If you do not follow these rules, then your doctor may refer you to a specialist, no longer prescribe pain medications for you, and release you/terminate you from his or her care. Drug safety-you must lock your drugs in a safe place. They must be kept away from children. We will also were review with you the right way to get rid of any extra drugs.  You may not sell, share, or let other people use your drugs. This is a crime and can cause overdoses. You may be asked to come to our office between scheduled appointments for random pill counts. Failure to comply with this will result in dismissal. Instructions for taking narcotic drugs-you are only to take pain medications that is prescribed by our office. Do not combine your pain medication with other physician narcotic pain medications or other peoples pain medications. Do not stop taking your narcotic suddenly.  Do not drive after a new pain drug is started or after a dose is  increased until you are sure it does not make you sleepy or confused. Do not try to cut or crush your drug unless told to do so. This could cause death. Your drug will be stopped if it is not helping enough or if it is showing signs of harm to you. You must tell your doctor about any new drugs or health problems. Your drug may not work well or may work differently if you have certain health problems.  You must tell your doctor about problems you have with any drugs prescribed or illegal. If you feel you're having an addiction problem with prescribed or illegal drugs you will discuss this with your doctor in order to be referred for treatment.  Your doctor reserves the right to limit other medications that can interact with pain medications. Prescriptions and refills- your narcotic drugs will be prescribed by our office only. You may not ask for pain drugs from any other doctors including emergency room doctors. Our office will decide how many refills you will be given and how often he will need to be seen in our office in order to get  them area at the very least every 3 month appointments are required. Never try to change a prescription. If you do this then it will be reported to the police. You will also be terminated from the practice. Your prescription will not be replaced if lost, stolen, or destroyed by accident. Patients on regular narcotics must keep their office visits on a regular basis-always every 3 months or less. It is at that appointment they will receive their prescriptions. Do not call for an additional month supply. An office visit is necessary. Appointments- you will keep all appointments with doctors, therapist, and counselors. If you miss your scheduled appointments often, then your doctor may slowly decrease your dose of narcotic drugs until you are no longer taking it. The dates when your prescription was filled at the pharmacy will be per 5. The state wide database will be checked at  standard visits to make sure the patient is not receiving pain prescriptions from other physicians.  Random drug testing for illegal substances will be standard. Your doctor may have you give urine, blood, hair, or saliva to run tests. If you have test results that are not normal then your doctor may slowly decrease your dose of narcotic drugs until you are no longer taking the medication or stop prescribing additional pain medications immediately. Refusal to do urine drug testing is basis for the practice no longer prescribe narcotic pain medications. Pain management specialist If your provider feels it is in your best interest to see a pain medicine specialist we will advise you have such and help you with the referral. Sometimes this referral is made because standard dosing of short acting medication is no longer keeping the patient's pain under reasonable control. Sometimes this is based upon a patient's health issue, and it is felt that pain management would best serve the patient's needs.  Once under the care of pain management we will no longer be prescribing the narcotic medications. This will be under the guidance of the pain management specialist. Further pain medication prescriptions will not be reassumed by this office once referred to pain management. In these situations we will continue to provide primary care but not pain medications.  Violations of the pain management agreement will result in our office no longer prescribing pain medications. In some situations it will also result in dismissal of the patient from our practice.  Health information-your doctor may need to discuss your treatment with pharmacists or other providers. Legal authorities may ask for your pain treatment records. If this happens then records will be given to them up on proper documentation/release.  You should also be aware that properly taking pain medications is very important in regards to operating a vehicle.  Even with following proper prescriptions instructions it is possible to be charged with operating a vehicle under influence. It is highly important that if you feel drowsy or drug that you do not operate a motor vehicle for the safety of yourself and others.

## 2016-03-05 NOTE — Progress Notes (Signed)
   Subjective:    Patient ID: Kelsey Sandoval, female    DOB: [birth year], 82 y.o.   MRN: [id number]  Hypertension This is a chronic problem. The current episode started more than 1 year ago. Risk factors for coronary artery disease include dyslipidemia and post-menopausal state. Treatments tried: lisinopril, metoprolol, norvasc.   Patient states that mentally she is doing better but is having pain in legs from knees to feet Patient relates having significant pain she would like to try increasing Neurontin she relates burning and discomfort in her lower legs. She denies any muscle aches. Patient denies any PND no shortness of breath. Denies chest heaviness. Denies headaches. States moods are doing better. Patient relates compliance with medicine denies abusing the medicine. Review of Systems  Constitutional: Negative for activity change and appetite change.  Gastrointestinal: Negative for vomiting and abdominal pain.  Neurological: Negative for weakness.  Psychiatric/Behavioral: Negative for confusion.       Objective:   Physical Exam  Constitutional: She appears well-nourished. No distress.  HENT:  Head: Normocephalic.  Cardiovascular: Normal rate, regular rhythm and normal heart sounds.   No murmur heard. Pulmonary/Chest: Effort normal and breath sounds normal.  Musculoskeletal: She exhibits no edema.  Lymphadenopathy:    She has no cervical adenopathy.  Neurological: She is alert.  Psychiatric: Her behavior is normal.  Vitals reviewed.         Assessment & Plan:  The patient was seen today as part of a comprehensive visit regarding pain control. Patient's compliance with the medication as well as discussion regarding effectiveness was completed. Prescriptions were written. Patient was advised to follow-up in 3 months. The patient was assessed for any signs of severe side effects. The patient was advised to take the medicine as directed and to report to [place] if any side effect  issues.  Patient states her depression is doing much better.  The patient does relate that she will try the increased dose of Neurontin sugar to 200 mg 3 times a day in a couple weeks go to 300 mg 3 times a day if this causes drowsiness she will stop it and then follow-up with [place] sooner otherwise recheck in 6 weeks patient does not one to do any testing currently but she does agree that if her symptoms get worse she will be willing to get nerve conduction studies  Patient relates reflux under good control continue current medication Patient states depression is doing much better taking the medication not having any problems Patient is taking her blood pressure medicine and is following up with her specialist not having any problems with the medicine no significant swelling shortness breath or chest heaviness She is taken her thyroid medicine recent lab work was reviewed the patient she is tolerating it well.

## 2016-03-11 ENCOUNTER — Ambulatory Visit (HOSPITAL_COMMUNITY): Payer: Self-pay | Admitting: Psychiatry

## 2016-03-13 ENCOUNTER — Ambulatory Visit (INDEPENDENT_AMBULATORY_CARE_PROVIDER_SITE_OTHER): Payer: Medicare Other | Admitting: Internal Medicine

## 2016-03-13 ENCOUNTER — Encounter: Payer: Self-pay | Admitting: Internal Medicine

## 2016-03-13 VITALS — BP 140/80 | HR 120 | Ht 70.0 in | Wt 163.0 lb

## 2016-03-13 DIAGNOSIS — I480 Paroxysmal atrial fibrillation: Secondary | ICD-10-CM | POA: Diagnosis not present

## 2016-03-13 LAB — CUP PACEART INCLINIC DEVICE CHECK
Battery Remaining Longevity: 132 mo
Battery Voltage: 2.79 V
Brady Statistic AS VS Percent: 66 %
Implantable Lead Implant Date: 20120810
Implantable Lead Implant Date: 20120810
Implantable Lead Model: 5076
Implantable Lead Model: 5076
Lead Channel Impedance Value: 445 Ohm
Lead Channel Impedance Value: 533 Ohm
Lead Channel Pacing Threshold Amplitude: 1 V
Lead Channel Pacing Threshold Amplitude: 1 V
Lead Channel Pacing Threshold Amplitude: 1.125 V
Lead Channel Pacing Threshold Pulse Width: 0.4 ms
Lead Channel Pacing Threshold Pulse Width: 0.4 ms
Lead Channel Setting Pacing Amplitude: 2 V
Lead Channel Setting Pacing Amplitude: 2 V
Lead Channel Setting Sensing Sensitivity: 5.6 mV
MDC IDC LEAD LOCATION: 753859
MDC IDC LEAD LOCATION: 753860
MDC IDC MSMT BATTERY IMPEDANCE: 226 Ohm
MDC IDC MSMT LEADCHNL RA PACING THRESHOLD PULSEWIDTH: 0.4 ms
MDC IDC MSMT LEADCHNL RA SENSING INTR AMPL: 4 mV
MDC IDC MSMT LEADCHNL RV PACING THRESHOLD AMPLITUDE: 0.75 V
MDC IDC MSMT LEADCHNL RV PACING THRESHOLD PULSEWIDTH: 0.4 ms
MDC IDC MSMT LEADCHNL RV SENSING INTR AMPL: 15.67 mV
MDC IDC SESS DTM: 20170609095327
MDC IDC SET LEADCHNL RV PACING PULSEWIDTH: 0.4 ms
MDC IDC STAT BRADY AP VP PERCENT: 0 %
MDC IDC STAT BRADY AP VS PERCENT: 34 %
MDC IDC STAT BRADY AS VP PERCENT: 0 %

## 2016-03-13 NOTE — Progress Notes (Signed)
HPI Kelsey Sandoval is referred today by Dr. Sallyanne Kuster for ongoing evaluation and management of her DDD PM. She is a pleasant 82 yo woman with sinus node dysfunction and HTN who underwent PPM insertion 5 years ago. She also has PAF and is on systemic anti-coagulation. The patient has done well except she is saddened by the loss of her significant other who died several months ago. Her sister who is a former patient of mine is with her today as well. She denies chest pain or sob.  Allergies  Allergen Reactions  . Penicillins Other (See Comments)    Caused patient to pass out.Can take cephalosporins Has patient had a PCN reaction causing immediate rash, facial/tongue/throat swelling, SOB or lightheadedness with hypotension: no Has patient had a PCN reaction causing severe rash involving mucus membranes or skin necrosis: No Has patient had a PCN reaction that required hospitalization No Has patient had a PCN reaction occurring within the last 10 years: No If all of the above answers are "NO", then may proceed with Cephalosporin use.   . Levaquin [Levofloxacin] Nausea Only  . Sulfa Antibiotics Other (See Comments)    Unknown  . Zithromax [Azithromycin] Nausea Only and Rash     Current Outpatient Prescriptions  Medication Sig Dispense Refill  . albuterol (PROVENTIL HFA;VENTOLIN HFA) 108 (90 BASE) MCG/ACT inhaler Inhale 2 puffs into the lungs every 6 (six) hours as needed for wheezing or shortness of breath. 18 g 5  . ALPRAZolam (XANAX) 1 MG tablet 1/2 in am , 1/2 mid day prn , then 1 qhs 60 tablet 5  . amLODipine (NORVASC) 10 MG tablet Take 1 tablet (10 mg total) by mouth daily. 30 tablet 12  . dabigatran (PRADAXA) 150 MG CAPS capsule Take 1 capsule (150 mg total) by mouth 2 (two) times daily. 180 capsule 6  . DULoxetine (CYMBALTA) 60 MG capsule Take 1 capsule (60 mg total) by mouth daily. 30 capsule 2  . DULoxetine (CYMBALTA) 60 MG capsule Take 60 mg by mouth.    . flecainide (TAMBOCOR) 50  MG tablet TAKE ONE TABLET BY MOUTH TWICE DAILY 180 tablet 3  . gabapentin (NEURONTIN) 300 MG capsule Take 1 capsule (300 mg total) by mouth 3 (three) times daily. 90 capsule 3  . HYDROcodone-acetaminophen (NORCO/VICODIN) 5-325 MG tablet Take 1 tablet by mouth every 4 (four) hours as needed for moderate pain. 120 tablet 0  . levothyroxine (SYNTHROID, LEVOTHROID) 50 MCG tablet TAKE ONE TABLET BY MOUTH ONCE DAILY **CHANGE  IN  DOSE** 30 tablet 12  . lisinopril (PRINIVIL,ZESTRIL) 40 MG tablet Take 1 tablet (40 mg total) by mouth daily. 30 tablet 12  . metoprolol succinate (TOPROL-XL) 50 MG 24 hr tablet TAKE ONE TABLET BY MOUTH ONCE DAILY WITH  OR  IMMEDIATELY  FOLLOWING  A  MEAL 30 tablet 12  . ondansetron (ZOFRAN-ODT) 8 MG disintegrating tablet Take 1 tablet (8 mg total) by mouth every 8 (eight) hours as needed for nausea or vomiting. 30 tablet 3  . pantoprazole (PROTONIX) 40 MG tablet Take 1 tablet (40 mg total) by mouth daily. Reported on 01/28/2016 30 tablet 12  . pravastatin (PRAVACHOL) 80 MG tablet Take 1 tablet (80 mg total) by mouth daily. 30 tablet 12  . riTUXimab in sodium chloride 0.9 % 250 mL Infusion 2 weeks apart: May repeat in 6 months. Follow up with primary care doctor prior to initiation of rituximab for infectious clearance  0  . vitamin B-12 (CYANOCOBALAMIN) 1000 MCG  tablet Take 1 tablet (1,000 mcg total) by mouth daily.     No current facility-administered medications for this visit.     Past Medical History  Diagnosis Date  . Hypertension   . Dysrhythmia   . Anxiety   . Depression   . Pacemaker   . Arthritis   . Hyperlipidemia   . Pre-diabetes   . Osteopenia   . Atrial fibrillation ([place])   . Back pain, chronic   . Cataracts, bilateral   . Urine protein increased   . Nephrotic syndrome     RHUX  . Membranous nephropathy determined by biopsy 06/07/2015    ROS:   All systems reviewed and negative except as noted in the HPI.   Past Surgical History  Procedure  Laterality Date  . Insert / replace / remove pacemaker    . Appendectomy    . Abdominal hysterectomy      partial-pt has no ovaries  . Back surgery    . Colonoscopy  9/05  . Dual chamber pacemaker      2012  . Colonoscopy N/A 10/11/2013    Procedure: COLONOSCOPY;  Surgeon: Rogene Houston, MD;  [facility];  Service: Endoscopy;  Laterality: N/A;  1200  . Esophagogastroduodenoscopy N/A 01/17/2014    Procedure: ESOPHAGOGASTRODUODENOSCOPY (EGD);  Surgeon: Rogene Houston, MD;  [facility];  Service: Endoscopy;  Laterality: N/A;  230  . Biopsy N/A 01/17/2014    Procedure: BIOPSY;  Surgeon: Rogene Houston, MD;  [facility];  Service: Endoscopy;  Laterality: N/A;  . Wisdom tooth extracton    . Cataract extraction w/phaco Left 02/28/2015    Procedure: CATARACT EXTRACTION PHACO AND INTRAOCULAR LENS PLACEMENT (IOC);  Surgeon: [name], MD;  Location: [facility];  Service: Ophthalmology;  Laterality: Left;  CDE 18.71  . Esophagogastroduodenoscopy N/A 01/02/2016    Procedure: ESOPHAGOGASTRODUODENOSCOPY (EGD);  Surgeon: Rogene Houston, MD;  [facility];  Service: Endoscopy;  Laterality: N/A;  240     Family History  Problem Relation Age of Onset  . Colon cancer Brother   . Anxiety disorder Sister   . Depression Sister   . Anxiety disorder Sister   . Depression Sister      Social History   Social History  . Marital Status: Divorced    Spouse Name: N/A  . Number of Children: N/A  . Years of Education: N/A   Occupational History  . Not on file.   Social History Main Topics  . Smoking status: Never Smoker   . Smokeless tobacco: Never Used  . Alcohol Use: No     Comment: 02-13-16 per pt no   . Drug Use: No     Comment: 02-13-16 per pt no  . Sexual Activity: Not on file   Other Topics Concern  . Not on file   Social History Narrative     BP 140/80 mmHg  Pulse 120  Ht 5\' 10"  (1.778 m)  Wt 163 lb (73.936 kg)  BMI 23.39 kg/m2  SpO2  91%  Physical Exam:  Well appearing 82 yo woman, NAD HEENT: Unremarkable Neck:  No JVD, no thyromegally Lymphatics:  No adenopathy Back:  No CVA tenderness Lungs:  Clear with no wheezes HEART:  Regular rate rhythm, no murmurs, no rubs, no clicks Abd:  soft, positive bowel sounds, no organomegally, no rebound, no guarding Ext:  2 plus pulses, no edema, no cyanosis, no clubbing Skin:  No rashes no nodules Neuro:  CN II through XII intact, motor grossly intact  EKG - nsr with atrial pacing  DEVICE  Normal device function.  See PaceArt for details.   Assess/Plan: 1. PAF - she is doing well with no chest pain or sob. No change in her meds. 2. PPM - her medtronic DDD PM is working normally. Will recheck in several months. 3. HTN - her blood pressure is reasonable well controlled. She states it is better at home.  Mikle Bosworth.D.

## 2016-03-13 NOTE — Patient Instructions (Addendum)
Your physician wants you to follow-up in: 12 Months with Dr. Lovena Le. You will receive a reminder letter in the mail two months in advance. If you don't receive a letter, please call our office to schedule the follow-up appointment.  Your physician recommends that you schedule a follow-up appointment in the [facility] in 6 Months.   Your physician recommends that you continue on your current medications as directed. Please refer to the Current Medication list given to you today.  If you need a refill on your cardiac medications before your next appointment, please call your pharmacy.  Thank you for choosing [facility]!

## 2016-03-25 ENCOUNTER — Ambulatory Visit (INDEPENDENT_AMBULATORY_CARE_PROVIDER_SITE_OTHER): Payer: Medicare Other | Admitting: Psychiatry

## 2016-03-25 ENCOUNTER — Encounter (HOSPITAL_COMMUNITY): Payer: Self-pay | Admitting: Psychiatry

## 2016-03-25 VITALS — BP 135/71 | HR 74 | Ht 70.0 in | Wt 165.8 lb

## 2016-03-25 DIAGNOSIS — F322 Major depressive disorder, single episode, severe without psychotic features: Secondary | ICD-10-CM

## 2016-03-25 MED ORDER — DULOXETINE HCL 60 MG PO CPEP: 60.0000 mg | ORAL_CAPSULE | Freq: Every day | ORAL | Status: DC
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
## 2016-04-16 ENCOUNTER — Encounter: Payer: Self-pay | Admitting: Family Medicine

## 2016-04-16 ENCOUNTER — Ambulatory Visit (INDEPENDENT_AMBULATORY_CARE_PROVIDER_SITE_OTHER): Payer: Medicare Other | Admitting: Family Medicine

## 2016-04-16 VITALS — BP 144/80 | Ht 70.0 in | Wt 167.0 lb

## 2016-04-16 DIAGNOSIS — M5431 Sciatica, right side: Secondary | ICD-10-CM

## 2016-04-16 MED ORDER — HYDROCODONE-ACETAMINOPHEN 7.5-325 MG PO TABS: 1.0000 | ORAL_TABLET | Freq: Four times a day (QID) | ORAL | Status: DC | PRN

## 2016-04-16 NOTE — Progress Notes (Signed)
   Subjective:    Patient ID: Kelsey Sandoval, female    DOB: 02/20/1933, 82 y.o.   MRN: [id number]  Back Pain This is a new problem. The current episode started more than 1 month ago. (Right leg pain, leg sore to touch, numbness in right foot) Treatments tried: hydrocodone, biofreeze. The treatment provided mild relief.   Dr Gwynneth Munson had shots with him  On Pradaxa Intermittent atrial fib under cardiology care   Review of Systems  Musculoskeletal: Positive for back pain.  Patient complains of pain discomfort radiation down the leg     Objective:   Physical Exam Subjective low back pain radiation down the right leg tenderness in the right lower back sciatica runs down the back of the leg all the way into the foot no weakness detected       Assessment & Plan:  Patient with significant low back pain and discomfort radiates down the right leg Patient has seen Dr.Bartko numerous times in the past We will go ahead and refer her back to see him She is on a blood thinner for intermittent atrial fibrillation. She will need to come off of this for injection procedure she is under the care of cardiology as well  Patient was given a prescription for hydrocodone 7.5 mg 14 times daily #60. This will take place of her 5 mg. She will let us know in 2 weeks how she is doing if this medication is tolerating well we will go ahead and issue her a 30 day prescription she will need a follow-up visit somewhere late August possibly will be up to go back down on the strength once her back is doing better. Caution drowsiness if any problems notify us

## 2016-04-23 ENCOUNTER — Telehealth: Payer: Self-pay | Admitting: Family Medicine

## 2016-04-23 DIAGNOSIS — M5431 Sciatica, right side: Secondary | ICD-10-CM

## 2016-04-23 NOTE — Telephone Encounter (Signed)
Spoke with patient and informed her per Dr.Scott Luking-we are going to put in a new referral to [facility]. Patient verbalized understanding.

## 2016-04-23 NOTE — Telephone Encounter (Signed)
This patient would benefit from seeing Dr. Brien Few, he is a physical medicine specialist not a physical therapist. I believe somehow the referral when in for physical therapy not for Dr. Brien Few. She needs to see Dr. Brien Few she is seen him before he works with [facility] please refer I would suggest putting it under a referral to neurosurgery under the comment section please put she needs to follow-up with Dr. Brien Few who she is seen before area please call the patient let her know that we are in the process of getting that set up thank you

## 2016-04-23 NOTE — Telephone Encounter (Signed)
Pt came by stating that [facility] called her yesterday regarding therapy. Pt states they told her that we recommended her to see a therapist. Pt doesn't feel she can do the therapy till she sees the other dr for her sciatica. Please advise.

## 2016-05-01 ENCOUNTER — Telehealth: Payer: Self-pay | Admitting: Family Medicine

## 2016-05-01 NOTE — Telephone Encounter (Signed)
Spoke with patient and informed her per Dr.Scott Luking-we are at the mercy of their office #2 you  should call their office and requested to be put on a cancellation list. Informed patient that I called the office and no appointment has been made left message and was told that they will call us back Monday and give [place] an update on the referral. Patient verbalized understanding.

## 2016-05-01 NOTE — Telephone Encounter (Signed)
Patient checking on her referral to Dr. Brien Few because she hasnt heard anything from their office.Estill Bamberg spoke with her last week and told her she sent everything to his office and they will contact her with appointment. She states cant wait any longer in severe pain and wanting to see what you can do to speed up the process.

## 2016-05-01 NOTE — Telephone Encounter (Signed)
#  1 please call patient-we are at the mercy of their office #2 the patient should call their office and requested to be put on a cancellation list #3 have someone from our staff call their office ask them to expedite her visit with Dr. Dema Severin because of severe pain

## 2016-05-04 ENCOUNTER — Telehealth: Payer: Self-pay | Admitting: Family Medicine

## 2016-05-04 NOTE — Telephone Encounter (Signed)
Pt called stating that she has not heard from the dr that she has been referred to and has tried calling them and has not been able to reach them. Pt is wanting to be referred to someone else.

## 2016-05-12 ENCOUNTER — Ambulatory Visit: Payer: Medicare Other | Admitting: Orthopaedic Surgery

## 2016-05-12 ENCOUNTER — Encounter: Payer: Self-pay | Admitting: Orthopaedic Surgery

## 2016-05-12 VITALS — BP 167/76 | HR 73 | Temp 97.2°F | Ht 67.0 in | Wt 169.4 lb

## 2016-05-12 DIAGNOSIS — Z950 Presence of cardiac pacemaker: Secondary | ICD-10-CM

## 2016-05-12 DIAGNOSIS — G894 Chronic pain syndrome: Secondary | ICD-10-CM

## 2016-05-12 DIAGNOSIS — M25561 Pain in right knee: Secondary | ICD-10-CM

## 2016-05-12 DIAGNOSIS — M25562 Pain in left knee: Secondary | ICD-10-CM

## 2016-05-12 DIAGNOSIS — F325 Major depressive disorder, single episode, in full remission: Secondary | ICD-10-CM

## 2016-05-12 DIAGNOSIS — I340 Nonrheumatic mitral (valve) insufficiency: Secondary | ICD-10-CM

## 2016-05-12 NOTE — Progress Notes (Signed)
CC: Both of my knees are hurting. I would like an injection in both knees.  The patient has had chronic pain and tenderness of both knees for some time.  Injections help.  There is no locking or giving way of the knee.  There is no new trauma. There is no redness or signs of infections.  The knees have a mild effusion and some crepitus.  There is no redness or signs of recent trauma.  Right knee ROM is 0-105 and left knee ROM is 0-100.  Impression:  Chronic pain of the both knees  Return:  1 month  PROCEDURE NOTE:  The patient requests injections of both knees, verbal consent was obtained.  The left and right knee were individually prepped appropriately after time out was performed.   Sterile technique was observed and injection of 1 cc of Depo-Medrol 40 mg with several cc's of plain xylocaine. Anesthesia was provided by ethyl chloride and a 20-gauge needle was used to inject each knee area. The injections were tolerated well.  A band aid dressing was applied.  The patient was advised to apply ice later today and tomorrow to the injection sight as needed.   Electronically Signed Sanjuana Kava, MD 8/8/20172:05 PM

## 2016-05-20 ENCOUNTER — Telehealth: Payer: Self-pay | Admitting: Family Medicine

## 2016-05-20 DIAGNOSIS — M5416 Radiculopathy, lumbar region: Secondary | ICD-10-CM | POA: Diagnosis not present

## 2016-05-20 NOTE — Telephone Encounter (Signed)
She may have a prescription for 30 tablets 1 every 6 hours when necessary severe pain use sparingly will need office visit before further prescriptions

## 2016-05-20 NOTE — Telephone Encounter (Signed)
[place] to see

## 2016-05-20 NOTE — Telephone Encounter (Signed)
Last prescribed 04/16/16

## 2016-05-20 NOTE — Telephone Encounter (Signed)
Pt is needing a refill on her HYDROcodone-acetaminophen (NORCO) 7.5-325 MG tablet

## 2016-05-21 DIAGNOSIS — M5416 Radiculopathy, lumbar region: Secondary | ICD-10-CM | POA: Diagnosis not present

## 2016-05-21 MED ORDER — HYDROCODONE-ACETAMINOPHEN 7.5-325 MG PO TABS: 1.0000 | ORAL_TABLET | Freq: Four times a day (QID) | ORAL | 0 refills | Status: DC | PRN

## 2016-05-21 NOTE — Telephone Encounter (Signed)
Patient notified script ready for pickup, office visit for further refills.

## 2016-05-29 ENCOUNTER — Encounter: Payer: Self-pay | Admitting: "Endocrinology

## 2016-06-03 ENCOUNTER — Ambulatory Visit (INDEPENDENT_AMBULATORY_CARE_PROVIDER_SITE_OTHER): Payer: Medicare Other | Admitting: Family Medicine

## 2016-06-03 ENCOUNTER — Encounter: Payer: Self-pay | Admitting: Family Medicine

## 2016-06-03 VITALS — BP 140/84 | Ht 70.0 in | Wt 167.2 lb

## 2016-06-03 DIAGNOSIS — M5431 Sciatica, right side: Secondary | ICD-10-CM | POA: Diagnosis not present

## 2016-06-03 DIAGNOSIS — G894 Chronic pain syndrome: Secondary | ICD-10-CM

## 2016-06-03 MED ORDER — GABAPENTIN 300 MG PO CAPS: ORAL_CAPSULE | ORAL | 3 refills | Status: DC

## 2016-06-03 MED ORDER — ALPRAZOLAM 1 MG PO TABS: ORAL_TABLET | ORAL | 3 refills | Status: DC

## 2016-06-03 MED ORDER — HYDROCODONE-ACETAMINOPHEN 5-325 MG PO TABS: 1.0000 | ORAL_TABLET | ORAL | 0 refills | Status: DC | PRN

## 2016-06-03 NOTE — Progress Notes (Signed)
   Subjective:    Patient ID: Kelsey Sandoval, female    DOB: 11/30/1932, 82 y.o.   MRN: [id number]  Hip Pain   The incident occurred more than 1 week ago. There was no injury mechanism. The pain is present in the right hip and right leg. The quality of the pain is described as burning. The pain is at a severity of 8/10. The pain is moderate. The symptoms are aggravated by weight bearing and movement. She has tried acetaminophen (Tylenol) for the symptoms. The treatment provided no relief.  Patient states she saw Dr. Brien Few he did do an injection it helps him he is following up to do another one later she will stop her blood thinner at least 4 days and advance according to their direction  Patient states pain medicine helps but she would prefer the 5 mg and be able to take it every 4 hours we discussed this in detail she states it does not cause drowsiness she does not abuse the medicine.  We also discussed in detail how Xanax will need to be tapered down. Hopefully be able to be stopped but this patient does have chronic insomnia and chronic anxiety stopping it cold [place] would not be the correct choice Patient states no other concerns this visit.    Review of Systems  Constitutional: Negative for activity change and appetite change.  Gastrointestinal: Negative for abdominal pain and vomiting.  Musculoskeletal: Positive for back pain.       Sciatica right leg  Neurological: Negative for weakness.  Psychiatric/Behavioral: Negative for confusion.       Objective:   Physical Exam  Constitutional: She appears well-nourished. No distress.  HENT:  Head: Normocephalic.  Cardiovascular: Normal rate, regular rhythm and normal heart sounds.   No murmur heard. Pulmonary/Chest: Effort normal and breath sounds normal.  Musculoskeletal: She exhibits no edema.  scitica right leg/straight leg positive  Lymphadenopathy:    She has no cervical adenopathy.  Neurological: She is alert.  Psychiatric:  Her behavior is normal.  Vitals reviewed.         Assessment & Plan:  The patient was educated that the increased risk of accidental overdose with benzodiazepines and opioids. The patient states she will only take her pain medicine during the day as necessary for the pain 1 every 4 hours no greater than 5 per day She will follow-up within 2 months time Xanax will be tapered daily she will use a half earlier in the day only if necessary in use a half or whole 1 at bedtime to help with sleep I encouraged her to try to stick with a half at bedtime She will follow-up with Dr. Brien Few for injections and she will follow-up with [place] in a couple months

## 2016-06-09 ENCOUNTER — Encounter: Payer: Self-pay | Admitting: Orthopaedic Surgery

## 2016-06-09 ENCOUNTER — Ambulatory Visit (INDEPENDENT_AMBULATORY_CARE_PROVIDER_SITE_OTHER): Payer: Medicare Other | Admitting: Orthopaedic Surgery

## 2016-06-09 VITALS — BP 125/85 | HR 101 | Temp 97.5°F | Ht 68.0 in | Wt 168.0 lb

## 2016-06-09 DIAGNOSIS — M25561 Pain in right knee: Secondary | ICD-10-CM

## 2016-06-09 DIAGNOSIS — Z950 Presence of cardiac pacemaker: Secondary | ICD-10-CM

## 2016-06-09 DIAGNOSIS — G894 Chronic pain syndrome: Secondary | ICD-10-CM

## 2016-06-09 DIAGNOSIS — M25562 Pain in left knee: Secondary | ICD-10-CM | POA: Diagnosis not present

## 2016-06-09 DIAGNOSIS — I340 Nonrheumatic mitral (valve) insufficiency: Secondary | ICD-10-CM

## 2016-06-09 NOTE — Progress Notes (Signed)
CC: Both of my knees are hurting. I would like an injection in both knees.  The patient has had chronic pain and tenderness of both knees for some time.  Injections help.  There is no locking or giving way of the knee.  There is no new trauma. There is no redness or signs of infections.  The knees have a mild effusion and some crepitus.  There is no redness or signs of recent trauma.  Right knee ROM is 0-95 and left knee ROM is 0-105.  Impression:  Chronic pain of the both knees  Return:  as needed.  PROCEDURE NOTE:  The patient requests injections of both knees, verbal consent was obtained.  The left and right knee were individually prepped appropriately after time out was performed.   Sterile technique was observed and injection of 1 cc of Depo-Medrol 40 mg with several cc's of plain xylocaine. Anesthesia was provided by ethyl chloride and a 20-gauge needle was used to inject each knee area. The injections were tolerated well.  A band aid dressing was applied.  The patient was advised to apply ice later today and tomorrow to the injection sight as needed.   Electronically Signed Sanjuana Kava, MD 9/5/20171:56 PM

## 2016-06-10 ENCOUNTER — Other Ambulatory Visit (HOSPITAL_COMMUNITY): Payer: Medicare Other

## 2016-06-10 ENCOUNTER — Ambulatory Visit (HOSPITAL_COMMUNITY): Payer: Medicare Other | Admitting: Hematology & Oncology

## 2016-06-10 NOTE — Telephone Encounter (Signed)
Pt was seen by Dr. Brien Few 05/20/16

## 2016-06-12 ENCOUNTER — Encounter: Payer: Self-pay | Admitting: Family Medicine

## 2016-06-12 ENCOUNTER — Ambulatory Visit (INDEPENDENT_AMBULATORY_CARE_PROVIDER_SITE_OTHER): Payer: Medicare Other | Admitting: Family Medicine

## 2016-06-12 ENCOUNTER — Other Ambulatory Visit (HOSPITAL_COMMUNITY)
Admission: RE | Admit: 2016-06-12 | Discharge: 2016-06-12 | Disposition: A | Discharge status: Home / Self Care | Payer: Medicare Other | Source: Ambulatory Visit | Attending: Family Medicine | Admitting: Family Medicine

## 2016-06-12 ENCOUNTER — Other Ambulatory Visit: Payer: Self-pay | Admitting: Cardiovascular Disease

## 2016-06-12 VITALS — BP 158/102

## 2016-06-12 DIAGNOSIS — R42 Dizziness and giddiness: Secondary | ICD-10-CM

## 2016-06-12 DIAGNOSIS — I951 Orthostatic hypotension: Secondary | ICD-10-CM | POA: Diagnosis not present

## 2016-06-12 DIAGNOSIS — N049 Nephrotic syndrome with unspecified morphologic changes: Secondary | ICD-10-CM

## 2016-06-12 LAB — CBC WITH DIFFERENTIAL/PLATELET
BASOS PCT: 0 %
Basophils Absolute: 0 10*3/uL (ref 0.0–0.1)
EOS ABS: 0 10*3/uL (ref 0.0–0.7)
Eosinophils Relative: 0 %
HCT: 39.2 % (ref 36.0–46.0)
Hemoglobin: 13.1 g/dL (ref 12.0–15.0)
LYMPHS ABS: 2 10*3/uL (ref 0.7–4.0)
Lymphocytes Relative: 21 %
MCH: 31.7 pg (ref 26.0–34.0)
MCHC: 33.4 g/dL (ref 30.0–36.0)
MCV: 94.9 fL (ref 78.0–100.0)
MONO ABS: 0.8 10*3/uL (ref 0.1–1.0)
MONOS PCT: 8 %
Neutro Abs: 6.7 10*3/uL (ref 1.7–7.7)
Neutrophils Relative %: 71 %
Platelets: 233 10*3/uL (ref 150–400)
RBC: 4.13 MIL/uL (ref 3.87–5.11)
RDW: 12.4 % (ref 11.5–15.5)
WBC: 9.6 10*3/uL (ref 4.0–10.5)

## 2016-06-12 LAB — BASIC METABOLIC PANEL
ANION GAP: 11 (ref 5–15)
BUN: 30 mg/dL — AB (ref 6–20)
CO2: 25 mmol/L (ref 22–32)
Calcium: 9.2 mg/dL (ref 8.9–10.3)
Chloride: 102 mmol/L (ref 101–111)
Creatinine, Ser: 1.37 mg/dL — ABNORMAL HIGH (ref 0.44–1.00)
GFR, EST AFRICAN AMERICAN: 41 mL/min — AB (ref >60)
GFR, EST NON AFRICAN AMERICAN: 35 mL/min — AB (ref >60)
Glucose, Bld: 103 mg/dL — ABNORMAL HIGH (ref 65–99)
POTASSIUM: 3.9 mmol/L (ref 3.5–5.1)
SODIUM: 138 mmol/L (ref 135–145)

## 2016-06-12 NOTE — Progress Notes (Signed)
   Subjective:    Patient ID: Kelsey Sandoval, female    DOB: 06/21/1933, 82 y.o.   MRN: [id number]  HPI Patient arrives with c/o of dizziness-feels very fuzzy in head like something in head not right. Patient feels like she is going to pass out-extreme fatigue and sob today.  Significant fatigue over the past couple days just hadn't felt good felt unsteady in the head. Denied any chest pain tightness relates some shortness of breath. Not coughing any. No hemoptysis. Is already on a blood thinner. In addition to this patient states her energy level has not been great past couple days she does state she's eating okay. She states when she stands up she feels a little bit dizzy. She has not passed out. In addition patient recently received some injections of her arthritis. Denies any change in her medication Review of Systems    see above denies fever cough vomiting diarrhea denies chest pressure tightness pain denies unilateral numbness or weakness Objective:   Physical Exam EOMI. Cranial nerves II through XII normal. Neck no masses. Lungs are clear no crackles heart is irregular but rate controlled extremities no edema skin warm dry blood pressure laying down sitting standing does show some drop but not severe. Glucose level looks normal.       Assessment & Plan:  Fatigue tiredness along with dizziness and orthostatic changes I do recommend a metabolic 7 and CBC. We will do the stat at the hospital. Await the results of this. A she may end up needing have further intervention depending upon how the patient does. If she gets worse over the weekend she is immediately to go to the ER.

## 2016-06-15 ENCOUNTER — Emergency Department (HOSPITAL_COMMUNITY)
Admission: EM | Admit: 2016-06-15 | Discharge: 2016-06-15 | Disposition: A | Discharge status: Home / Self Care | Payer: Medicare Other | Attending: Emergency Medicine | Admitting: Emergency Medicine

## 2016-06-15 ENCOUNTER — Telehealth: Payer: Self-pay

## 2016-06-15 ENCOUNTER — Emergency Department (HOSPITAL_COMMUNITY): Payer: Medicare Other

## 2016-06-15 ENCOUNTER — Encounter (HOSPITAL_COMMUNITY): Payer: Self-pay | Admitting: Emergency Medicine

## 2016-06-15 DIAGNOSIS — R0600 Dyspnea, unspecified: Secondary | ICD-10-CM

## 2016-06-15 DIAGNOSIS — R42 Dizziness and giddiness: Secondary | ICD-10-CM | POA: Diagnosis not present

## 2016-06-15 DIAGNOSIS — Z79899 Other long term (current) drug therapy: Secondary | ICD-10-CM | POA: Diagnosis not present

## 2016-06-15 DIAGNOSIS — E039 Hypothyroidism, unspecified: Secondary | ICD-10-CM | POA: Diagnosis not present

## 2016-06-15 DIAGNOSIS — I10 Essential (primary) hypertension: Secondary | ICD-10-CM | POA: Diagnosis not present

## 2016-06-15 DIAGNOSIS — R0602 Shortness of breath: Secondary | ICD-10-CM | POA: Diagnosis not present

## 2016-06-15 LAB — URINALYSIS, ROUTINE W REFLEX MICROSCOPIC
Bilirubin Urine: NEGATIVE
Glucose, UA: NEGATIVE mg/dL
Hgb urine dipstick: NEGATIVE
Ketones, ur: NEGATIVE mg/dL
Leukocytes, UA: NEGATIVE
Nitrite: NEGATIVE
Protein, ur: 100 mg/dL — AB
Specific Gravity, Urine: 1.01 (ref 1.005–1.030)
pH: 6 (ref 5.0–8.0)

## 2016-06-15 LAB — BASIC METABOLIC PANEL
Anion gap: 10 (ref 5–15)
BUN: 27 mg/dL — ABNORMAL HIGH (ref 6–20)
CO2: 24 mmol/L (ref 22–32)
Calcium: 9 mg/dL (ref 8.9–10.3)
Chloride: 103 mmol/L (ref 101–111)
Creatinine, Ser: 1.47 mg/dL — ABNORMAL HIGH (ref 0.44–1.00)
GFR calc Af Amer: 37 mL/min — ABNORMAL LOW (ref >60)
GFR calc non Af Amer: 32 mL/min — ABNORMAL LOW (ref >60)
Glucose, Bld: 105 mg/dL — ABNORMAL HIGH (ref 65–99)
Potassium: 4.3 mmol/L (ref 3.5–5.1)
Sodium: 137 mmol/L (ref 135–145)

## 2016-06-15 LAB — URINE MICROSCOPIC-ADD ON
Bacteria, UA: NONE SEEN
RBC / HPF: NONE SEEN RBC/hpf (ref 0–5)

## 2016-06-15 LAB — CBC WITH DIFFERENTIAL/PLATELET
Basophils Absolute: 0 10*3/uL (ref 0.0–0.1)
Basophils Relative: 0 %
Eosinophils Absolute: 0 10*3/uL (ref 0.0–0.7)
Eosinophils Relative: 0 %
HCT: 38.6 % (ref 36.0–46.0)
Hemoglobin: 12.9 g/dL (ref 12.0–15.0)
Lymphocytes Relative: 15 %
Lymphs Abs: 1.9 10*3/uL (ref 0.7–4.0)
MCH: 31.2 pg (ref 26.0–34.0)
MCHC: 33.4 g/dL (ref 30.0–36.0)
MCV: 93.5 fL (ref 78.0–100.0)
Monocytes Absolute: 0.7 10*3/uL (ref 0.1–1.0)
Monocytes Relative: 6 %
Neutro Abs: 10 10*3/uL — ABNORMAL HIGH (ref 1.7–7.7)
Neutrophils Relative %: 79 %
Platelets: 239 10*3/uL (ref 150–400)
RBC: 4.13 MIL/uL (ref 3.87–5.11)
RDW: 12.8 % (ref 11.5–15.5)
WBC: 12.7 10*3/uL — ABNORMAL HIGH (ref 4.0–10.5)

## 2016-06-15 LAB — TROPONIN I: Troponin I: <0.03 ng/mL (ref <0.03)

## 2016-06-15 NOTE — ED Triage Notes (Signed)
Pt reports "not feeling well" for past week, states that she feels like she is going to pass out and also having sob with indigestion.  Pt alert and oriented, 99%on RA, airway patent.  Pt saw primary doctor Friday and did labwork, no chest xray was performed.

## 2016-06-15 NOTE — Telephone Encounter (Signed)
Pt. Called complaining of Shortness of breath, fatigue, feeling clammy and sweaty. She denies chest pain or pressure. She has not taken her blood pressure, but she did state she went to Dr. Willey Blade on Friday with the same symptoms and was told if her symptoms persist to be evaluated in ED. I also advised her to go to the ED. She stated her son was on the way to her house from [employer] to take her to [facility] for evaluation.

## 2016-06-15 NOTE — ED Provider Notes (Signed)
[facility] DEPT Provider Note   CSN: [id number] Arrival date & time: 06/15/16  1246  By signing my name below, I, [name], attest that this documentation has been prepared under the direction and in the presence of Virgel Manifold, MD . Electronically Signed: [name], Scribe. 06/15/2016. 1:28 PM.  History   Chief Complaint Chief Complaint  Patient presents with  . Shortness of Breath   The history is provided by the patient. No language interpreter was used.   HPI Comments: Kelsey Sandoval is a 82 y.o. female with PMHx of A-Fib, HTN and HLD, who presents to the Emergency Department complaining of gradually worsening, dizziness, shortness of breath and indigestion that began 1 week ago and worsened last night. Pt reports she has "not felt well" for the past week and experienced a few spells of dizziness when moving from a seated to standing position. She notes she felt like she was going to pass out 2 days ago. Pt also states associated shortness of breath and indigestion that worsened last night; she describes her indigestion as "choking." Pt notes she visited her PCP 2 days ago for her dizziness and had lab work done; pt's husband reports pt's PCP told her she may have received too many steroids from her recent bilateral knee injection on 06/09/16. Pt reports she has a pacemaker due to A-Fib and is currently followed by a cardiologist. She denies cough, leg swelling, fever, hematuria and difficulty urinating. She also denies hx of GERD or stomach ulcers. Pt notes PSHx of heart catheterization 5 years ago.    PCP: Dr. Wolfgang Phoenix   Past Medical History:  Diagnosis Date  . Anxiety   . Arthritis   . Atrial fibrillation ([place])   . Back pain, chronic   . Cataracts, bilateral   . Depression   . Dysrhythmia   . Hyperlipidemia   . Hypertension   . Membranous nephropathy determined by biopsy 06/07/2015  . Nephrotic syndrome    RHUX  . Osteopenia   . Pacemaker   . Pre-diabetes   . Urine  protein increased     Patient Active Problem List   Diagnosis Date Noted  . Hereditary and idiopathic peripheral neuropathy 03/05/2016  . Major depression ([place]) 12/04/2015  . Major depression in remission ([place]) 09/02/2015  . Membranous nephropathy determined by biopsy 06/07/2015  . Osteoarthritis of both knees 03/26/2015  . Chronic pain syndrome 03/26/2015  . Intractable nausea and vomiting   . Nausea with vomiting   . Malnutrition of moderate degree ([place]) 03/08/2015  . CAP (community acquired pneumonia) 03/08/2015  . UTI (lower urinary tract infection) 03/07/2015  . Nausea & vomiting 03/07/2015  . Hypothyroidism 02/14/2015  . Disorder of kidney 01/17/2015  . Nephrotic syndrome 06/21/2014  . Proteinuria 03/29/2014  . Osteopenia 03/29/2014  . Anemia, iron deficiency 08/22/2013  . Mitral insufficiency 06/11/2013  . Pacemaker 06/11/2013  . Paroxysmal atrial fibrillation ([place]) 09/26/2012  . Tachycardia-bradycardia syndrome ([place]) 09/26/2012  . Sick sinus syndrome ([place]) 09/26/2012  . Systemic hypertension 09/26/2012  . Dyslipidemia 09/26/2012    Past Surgical History:  Procedure Laterality Date  . ABDOMINAL HYSTERECTOMY     partial-pt has no ovaries  . APPENDECTOMY    . BACK SURGERY    . BIOPSY N/A 01/17/2014   Procedure: BIOPSY;  Surgeon: Rogene Houston, MD;  Location: [facility];  Service: Endoscopy;  Laterality: N/A;  . CATARACT EXTRACTION W/PHACO Left 02/28/2015   Procedure: CATARACT EXTRACTION PHACO AND INTRAOCULAR LENS PLACEMENT (IOC);  Surgeon: [name], MD;  Location: [facility] ORS;  Service: Ophthalmology;  Laterality: Left;  CDE 18.71  . COLONOSCOPY  9/05  . COLONOSCOPY N/A 10/11/2013   Procedure: COLONOSCOPY;  Surgeon: Rogene Houston, MD;  Location: [facility];  Service: Endoscopy;  Laterality: N/A;  1200  . dual chamber pacemaker     2012  . ESOPHAGOGASTRODUODENOSCOPY N/A 01/17/2014   Procedure: ESOPHAGOGASTRODUODENOSCOPY (EGD);  Surgeon: Rogene Houston, MD;   Location: [facility];  Service: Endoscopy;  Laterality: N/A;  230  . ESOPHAGOGASTRODUODENOSCOPY N/A 01/02/2016   Procedure: ESOPHAGOGASTRODUODENOSCOPY (EGD);  Surgeon: Rogene Houston, MD;  Location: [facility];  Service: Endoscopy;  Laterality: N/A;  240  . INSERT / REPLACE / REMOVE PACEMAKER    . wisdom tooth extracton      OB History    No data available     Home Medications    Prior to Admission medications   Medication Sig Start Date End Date Taking? Authorizing Provider  albuterol (PROVENTIL HFA;VENTOLIN HFA) 108 (90 BASE) MCG/ACT inhaler Inhale 2 puffs into the lungs every 6 (six) hours as needed for wheezing or shortness of breath. 09/17/14   Kathyrn Drown, MD  ALPRAZolam Duanne Moron) 1 MG tablet 1/2 in am  , then 1/2 to 1 qhs 06/03/16   Kathyrn Drown, MD  amLODipine (NORVASC) 10 MG tablet Take 1 tablet (10 mg total) by mouth daily. 01/28/16   Kathyrn Drown, MD  DULoxetine (CYMBALTA) 60 MG capsule Take 1 capsule (60 mg total) by mouth daily. 03/25/16 03/25/17  Cloria Spring, MD  flecainide (TAMBOCOR) 50 MG tablet TAKE ONE TABLET BY MOUTH TWICE DAILY 07/09/15   Herminio Commons, MD  gabapentin (NEURONTIN) 300 MG capsule 1 in the am, 1 in mid day, 2 at bedtime 06/03/16   Kathyrn Drown, MD  HYDROcodone-acetaminophen (NORCO/VICODIN) 5-325 MG tablet Take 1 tablet by mouth every 4 (four) hours as needed. 06/03/16   Kathyrn Drown, MD  levothyroxine (SYNTHROID, LEVOTHROID) 50 MCG tablet TAKE ONE TABLET BY MOUTH ONCE DAILY **CHANGE  IN  DOSE** 01/28/16   Kathyrn Drown, MD  lisinopril (PRINIVIL,ZESTRIL) 40 MG tablet Take 1 tablet (40 mg total) by mouth daily. 09/02/15   Kathyrn Drown, MD  metoprolol succinate (TOPROL-XL) 50 MG 24 hr tablet TAKE ONE TABLET BY MOUTH ONCE DAILY WITH  OR  IMMEDIATELY  FOLLOWING  A  MEAL 09/02/15   Kathyrn Drown, MD  ondansetron (ZOFRAN-ODT) 8 MG disintegrating tablet Take 1 tablet (8 mg total) by mouth every 8 (eight) hours as needed for nausea or vomiting.  12/24/15   Kathyrn Drown, MD  pantoprazole (PROTONIX) 40 MG tablet Take 1 tablet (40 mg total) by mouth daily. Reported on 01/28/2016 01/28/16   Kathyrn Drown, MD  PRADAXA 150 MG CAPS capsule TAKE ONE CAPSULE BY MOUTH TWICE DAILY 06/12/16   Herminio Commons, MD  pravastatin (PRAVACHOL) 80 MG tablet Take 1 tablet (80 mg total) by mouth daily. 09/02/15   Kathyrn Drown, MD  riTUXimab in sodium chloride 0.9 % 250 mL Infusion 2 weeks apart: May repeat in 6 months. Follow up with primary care doctor prior to initiation of rituximab for infectious clearance 03/11/15   Kathie Dike, MD  vitamin B-12 (CYANOCOBALAMIN) 1000 MCG tablet Take 1 tablet (1,000 mcg total) by mouth daily. 03/11/15   Kathie Dike, MD    Family History Family History  Problem Relation Age of Onset  . Colon cancer  Brother   . Anxiety disorder Sister   . Depression Sister   . Anxiety disorder Sister   . Depression Sister     Social History Social History  Substance Use Topics  . Smoking status: Never Smoker  . Smokeless tobacco: Never Used  . Alcohol use No     Comment: 02-13-16 per pt no    Allergies   Penicillins; Levaquin [levofloxacin]; Sulfa antibiotics; and Zithromax [azithromycin]   Review of Systems Review of Systems  Constitutional: Negative for fever.  Respiratory: Positive for shortness of breath. Negative for cough.   Cardiovascular: Negative for leg swelling.  Genitourinary: Negative for difficulty urinating, dysuria and hematuria.  Neurological: Positive for dizziness.   Physical Exam Updated Vital Signs BP 162/93 (BP Location: Right Arm)   Pulse 103   Temp 98.7 F (37.1 C) (Oral)   Resp 16   Ht 5\' 8"  (1.727 m)   Wt 168 lb (76.2 kg)   SpO2 99%   BMI 25.54 kg/m   Physical Exam  Constitutional: She is oriented to person, place, and time. She appears well-developed and well-nourished. No distress.  HENT:  Head: Normocephalic and atraumatic.  Eyes: EOM are normal.  Neck: Normal range of  motion.  Cardiovascular: Normal rate, regular rhythm and normal heart sounds.   Pulmonary/Chest: Effort normal and breath sounds normal.  Abdominal: Soft. She exhibits no distension. There is no tenderness.  Musculoskeletal: Normal range of motion.  Neurological: She is alert and oriented to person, place, and time.  Skin: Skin is warm and dry.  Psychiatric: She has a normal mood and affect. Judgment normal.  Nursing note and vitals reviewed.  ED Treatments / Results  Labs (all labs ordered are listed, but only abnormal results are displayed) Labs Reviewed  CBC WITH DIFFERENTIAL/PLATELET - Abnormal; Notable for the following:       Result Value   WBC 12.7 (*)    Neutro Abs 10.0 (*)    All other components within normal limits  BASIC METABOLIC PANEL - Abnormal; Notable for the following:    Glucose, Bld 105 (*)    BUN 27 (*)    Creatinine, Ser 1.47 (*)    GFR calc non Af Amer 32 (*)    GFR calc Af Amer 37 (*)    All other components within normal limits  URINALYSIS, ROUTINE W REFLEX MICROSCOPIC (NOT AT [facility]) - Abnormal; Notable for the following:    Protein, ur 100 (*)    All other components within normal limits  URINE MICROSCOPIC-ADD ON - Abnormal; Notable for the following:    Squamous Epithelial / LPF 0-5 (*)    All other components within normal limits  TROPONIN I    EKG  EKG Interpretation  Date/Time:  Monday June 15 2016 12:57:54 EDT Ventricular Rate:  95 PR Interval:    QRS Duration: 107 QT Interval:  369 QTC Calculation: 464 R Axis:   20 Text Interpretation:  Atrial fibrillation Non-specific ST-t changes Confirmed by Wilson Singer  MD, Zhanna Melin [phone]) on 06/15/2016 1:26:26 PM      Radiology Dg Chest 2 View  Result Date: 06/15/2016 CLINICAL DATA:  Shortness of breath for 1 week, pacemaker, hypertension, atrial fibrillation EXAM: CHEST  2 VIEW COMPARISON:  12/10/2015 FINDINGS: RIGHT subclavian transvenous pacemaker leads project at RIGHT atrium RIGHT ventricle,  unchanged. Numerous EKG leads project over chest. Upper normal heart size. Atherosclerotic calcification and tortuosity of thoracic aorta. Bronchitic and emphysematous changes consistent with COPD. No acute infiltrate, pleural effusion, or  pneumothorax. Question vague nodular density adjacent to LEFT heart border. Bones diffusely demineralized. IMPRESSION: COPD changes without acute infiltrate. **An incidental finding of potential clinical significance has been found. Question vague nodular density adjacent to LEFT heart border; CT chest recommended to exclude developing pulmonary nodule.** Electronically Signed   By: Lavonia Dana M.D.   On: 06/15/2016 13:40   Procedures Procedures (including critical care time)  Medications Ordered in ED Medications - No data to display  Initial Impression / Assessment and Plan / ED Course  I have reviewed the triage vital signs and the nursing notes.  Pertinent labs & imaging results that were available during my care of the patient were reviewed by me and considered in my medical decision making (see chart for details).  Clinical Course  DIAGNOSTIC STUDIES:  Oxygen Saturation is 99% on RA, normal by my interpretation.    COORDINATION OF CARE:  1:15 PM Discussed treatment plan with pt at bedside and pt agreed to plan.   I personally performed the services described in this documentation, which was scribed in my presence. The recorded information has been reviewed and is accurate.   Final Clinical Impressions(s) / ED Diagnoses   Final diagnoses:  Dyspnea    New Prescriptions New Prescriptions   No medications on file     Virgel Manifold, MD 06/18/16 1404

## 2016-06-16 ENCOUNTER — Telehealth: Payer: Self-pay | Admitting: Cardiovascular Disease

## 2016-06-16 NOTE — Telephone Encounter (Signed)
Pt seen in ED yesterday,has f/u with pcp in 3 days.We scheduled pacemaker check for December. Pt states ED does not know what is wrong with her

## 2016-06-16 NOTE — Telephone Encounter (Signed)
Patient stated that she went to ER yesterday and just wanted to let nurse what they said. / tg

## 2016-06-19 ENCOUNTER — Encounter: Payer: Self-pay | Admitting: Family Medicine

## 2016-06-19 ENCOUNTER — Ambulatory Visit (INDEPENDENT_AMBULATORY_CARE_PROVIDER_SITE_OTHER): Payer: Medicare Other | Admitting: Family Medicine

## 2016-06-19 VITALS — BP 106/68 | Temp 98.8°F | Ht 68.0 in | Wt 169.0 lb

## 2016-06-19 DIAGNOSIS — G894 Chronic pain syndrome: Secondary | ICD-10-CM | POA: Diagnosis not present

## 2016-06-19 DIAGNOSIS — Z23 Encounter for immunization: Secondary | ICD-10-CM | POA: Diagnosis not present

## 2016-06-19 DIAGNOSIS — I10 Essential (primary) hypertension: Secondary | ICD-10-CM | POA: Diagnosis not present

## 2016-06-19 DIAGNOSIS — M170 Bilateral primary osteoarthritis of knee: Secondary | ICD-10-CM

## 2016-06-19 MED ORDER — GABAPENTIN 300 MG PO CAPS: ORAL_CAPSULE | ORAL | 0 refills | Status: DC

## 2016-06-19 NOTE — Progress Notes (Signed)
   Subjective:    Patient ID: Kelsey Sandoval, female    DOB: [birth year], 82 y.o.   MRN: [id number]  HPIFollowing up on fatigue and weakness. Feeling some better. Still having some ear pain. Went to ED 5 days ago for reflux. Reflux better now.   Patient states she's been low but better now she is trying to eat better. Taking medicines as directed does not get dizzy when she stands up no fevers wheezing or difficulty breathing. Denies any significant swelling in the legs.  Review of Systems  Constitutional: Negative for activity change, fatigue and fever.  Respiratory: Negative for cough and shortness of breath.   Cardiovascular: Negative for chest pain and leg swelling.  Neurological: Negative for headaches.       Objective:   Physical Exam  Constitutional: She appears well-nourished. No distress.  Cardiovascular: Normal rate and normal heart sounds.   No murmur heard. Pulmonary/Chest: Effort normal and breath sounds normal. No respiratory distress.  Musculoskeletal: She exhibits no edema.  Lymphadenopathy:    She has no cervical adenopathy.  Neurological: She is alert. She exhibits normal muscle tone.  Psychiatric: Her behavior is normal.  Vitals reviewed.         Assessment & Plan:  Fatigue is hard to tell if this was a viral illness or possibly related to the gabapentin. We have reduced the dose of the gabapentin so hopefully this will help  Chronic back pain uses hydrocodone when necessary try to avoid excessive use she states she would take anywhere between 3 and 5 per day  Hypertension good control currently no signs of orthostasis on today's exam. Patient needs to follow-up in a proximally 3 months sooner problems

## 2016-06-22 ENCOUNTER — Telehealth (HOSPITAL_COMMUNITY): Payer: Self-pay | Admitting: *Deleted

## 2016-06-22 NOTE — Telephone Encounter (Signed)
Called pt to inform her to arrive at 9:30am on 06-25-2016 for f/u. Office number was provided.

## 2016-06-23 DIAGNOSIS — I10 Essential (primary) hypertension: Secondary | ICD-10-CM | POA: Diagnosis not present

## 2016-06-23 DIAGNOSIS — M5126 Other intervertebral disc displacement, lumbar region: Secondary | ICD-10-CM | POA: Diagnosis not present

## 2016-06-23 DIAGNOSIS — M5416 Radiculopathy, lumbar region: Secondary | ICD-10-CM | POA: Diagnosis not present

## 2016-06-25 ENCOUNTER — Encounter (HOSPITAL_COMMUNITY): Payer: Self-pay | Admitting: Psychiatry

## 2016-06-25 ENCOUNTER — Ambulatory Visit (HOSPITAL_COMMUNITY): Payer: Self-pay | Admitting: Psychiatry

## 2016-06-25 ENCOUNTER — Ambulatory Visit (INDEPENDENT_AMBULATORY_CARE_PROVIDER_SITE_OTHER): Payer: Medicare Other | Admitting: Psychiatry

## 2016-06-25 VITALS — BP 136/91 | HR 103 | Ht 68.0 in | Wt 168.6 lb

## 2016-06-25 DIAGNOSIS — F322 Major depressive disorder, single episode, severe without psychotic features: Secondary | ICD-10-CM

## 2016-06-25 MED ORDER — DULOXETINE HCL 60 MG PO CPEP: 60.0000 mg | ORAL_CAPSULE | Freq: Two times a day (BID) | ORAL | 2 refills | Status: DC
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
## 2016-07-03 ENCOUNTER — Encounter: Payer: Self-pay | Admitting: Cardiovascular Disease

## 2016-07-03 ENCOUNTER — Ambulatory Visit (INDEPENDENT_AMBULATORY_CARE_PROVIDER_SITE_OTHER): Payer: Medicare Other | Admitting: Cardiovascular Disease

## 2016-07-03 VITALS — BP 120/64 | HR 92 | Ht 70.0 in | Wt 169.0 lb

## 2016-07-03 DIAGNOSIS — I10 Essential (primary) hypertension: Secondary | ICD-10-CM | POA: Diagnosis not present

## 2016-07-03 DIAGNOSIS — Z950 Presence of cardiac pacemaker: Secondary | ICD-10-CM | POA: Diagnosis not present

## 2016-07-03 DIAGNOSIS — I495 Sick sinus syndrome: Secondary | ICD-10-CM

## 2016-07-03 DIAGNOSIS — I480 Paroxysmal atrial fibrillation: Secondary | ICD-10-CM | POA: Diagnosis not present

## 2016-07-03 DIAGNOSIS — Z9289 Personal history of other medical treatment: Secondary | ICD-10-CM

## 2016-07-03 DIAGNOSIS — Z87898 Personal history of other specified conditions: Secondary | ICD-10-CM

## 2016-07-03 MED ORDER — DABIGATRAN ETEXILATE MESYLATE 150 MG PO CAPS: 150.0000 mg | ORAL_CAPSULE | Freq: Two times a day (BID) | ORAL | 3 refills | Status: DC

## 2016-07-03 MED ORDER — FLECAINIDE ACETATE 50 MG PO TABS: 50.0000 mg | ORAL_TABLET | Freq: Two times a day (BID) | ORAL | 3 refills | Status: DC

## 2016-07-03 NOTE — Progress Notes (Signed)
SUBJECTIVE: Patient presents for routine follow-up. Evaluated in the ED on 9/11 for shortness of breath. Chest x-ray showed COPD changes without acute infiltrate. ECG showed atrial fibrillation, heart rate 95 bpm. She has a history of a pacemaker for tachycardia-bradycardia syndrome (implanted 05/15/11), essential hypertension, and paroxysmal atrial fibrillation. She has been maintained on flecainide and Pradaxa. Normal nuclear stress test on 07/23/10, LVEF >70%.  She said she is feeling much better. She has exertional dyspnea if he hurries. Denies chest pain and palpitations. He walks with a cane due to sciatic nerve problems.   Review of Systems: As per "subjective", otherwise negative.  Allergies  Allergen Reactions  . Penicillins Other (See Comments)    Caused patient to pass out.Can take cephalosporins Has patient had a PCN reaction causing immediate rash, facial/tongue/throat swelling, SOB or lightheadedness with hypotension: no Has patient had a PCN reaction causing severe rash involving mucus membranes or skin necrosis: No Has patient had a PCN reaction that required hospitalization No Has patient had a PCN reaction occurring within the last 10 years: No If all of the above answers are "NO", then may proceed with Cephalosporin use.   . Levaquin [Levofloxacin] Nausea Only  . Sulfa Antibiotics Other (See Comments)    Unknown  . Zithromax [Azithromycin] Nausea Only and Rash    Current Outpatient Prescriptions  Medication Sig Dispense Refill  . albuterol (PROVENTIL HFA;VENTOLIN HFA) 108 (90 BASE) MCG/ACT inhaler Inhale 2 puffs into the lungs every 6 (six) hours as needed for wheezing or shortness of breath. 18 g 5  . ALPRAZolam (XANAX) 1 MG tablet 1/2 in am  , then 1/2 to 1 qhs 45 tablet 3  . amLODipine (NORVASC) 10 MG tablet Take 1 tablet (10 mg total) by mouth daily. 30 tablet 12  . DULoxetine (CYMBALTA) 60 MG capsule Take 1 capsule (60 mg total) by mouth 2 (two) times  daily. 60 capsule 2  . flecainide (TAMBOCOR) 50 MG tablet TAKE ONE TABLET BY MOUTH TWICE DAILY 180 tablet 3  . gabapentin (NEURONTIN) 300 MG capsule 1 in the am and one in the evening 60 capsule 0  . HYDROcodone-acetaminophen (NORCO/VICODIN) 5-325 MG tablet Take 1 tablet by mouth every 4 (four) hours as needed. 150 tablet 0  . levothyroxine (SYNTHROID, LEVOTHROID) 50 MCG tablet TAKE ONE TABLET BY MOUTH ONCE DAILY **CHANGE  IN  DOSE** 30 tablet 12  . lisinopril (PRINIVIL,ZESTRIL) 40 MG tablet Take 1 tablet (40 mg total) by mouth daily. 30 tablet 12  . metoprolol succinate (TOPROL-XL) 50 MG 24 hr tablet TAKE ONE TABLET BY MOUTH ONCE DAILY WITH  OR  IMMEDIATELY  FOLLOWING  A  MEAL 30 tablet 12  . ondansetron (ZOFRAN-ODT) 8 MG disintegrating tablet Take 1 tablet (8 mg total) by mouth every 8 (eight) hours as needed for nausea or vomiting. 30 tablet 3  . pantoprazole (PROTONIX) 40 MG tablet Take 1 tablet (40 mg total) by mouth daily. Reported on 01/28/2016 30 tablet 12  . PRADAXA 150 MG CAPS capsule TAKE ONE CAPSULE BY MOUTH TWICE DAILY 60 capsule 0  . pravastatin (PRAVACHOL) 80 MG tablet Take 1 tablet (80 mg total) by mouth daily. 30 tablet 12  . riTUXimab in sodium chloride 0.9 % 250 mL Infusion 2 weeks apart: May repeat in 6 months. Follow up with primary care doctor prior to initiation of rituximab for infectious clearance  0  . vitamin B-12 (CYANOCOBALAMIN) 1000 MCG tablet Take 1 tablet (1,000 mcg total) by mouth  daily.     No current facility-administered medications for this visit.     Past Medical History:  Diagnosis Date  . Anxiety   . Arthritis   . Atrial fibrillation ([place])   . Back pain, chronic   . Cataracts, bilateral   . Depression   . Dysrhythmia   . Hyperlipidemia   . Hypertension   . Membranous nephropathy determined by biopsy 06/07/2015  . Nephrotic syndrome    RHUX  . Osteopenia   . Pacemaker   . Pre-diabetes   . Urine protein increased     Past Surgical History:    Procedure Laterality Date  . ABDOMINAL HYSTERECTOMY     partial-pt has no ovaries  . APPENDECTOMY    . BACK SURGERY    . BIOPSY N/A 01/17/2014   Procedure: BIOPSY;  Surgeon: Rogene Houston, MD;  [facility];  Service: Endoscopy;  Laterality: N/A;  . CATARACT EXTRACTION W/PHACO Left 02/28/2015   Procedure: CATARACT EXTRACTION PHACO AND INTRAOCULAR LENS PLACEMENT (IOC);  Surgeon: [name], MD;  Location: [facility];  Service: Ophthalmology;  Laterality: Left;  CDE 18.71  . COLONOSCOPY  9/05  . COLONOSCOPY N/A 10/11/2013   Procedure: COLONOSCOPY;  Surgeon: Rogene Houston, MD;  [facility];  Service: Endoscopy;  Laterality: N/A;  1200  . dual chamber pacemaker     2012  . ESOPHAGOGASTRODUODENOSCOPY N/A 01/17/2014   Procedure: ESOPHAGOGASTRODUODENOSCOPY (EGD);  Surgeon: Rogene Houston, MD;  [facility];  Service: Endoscopy;  Laterality: N/A;  230  . ESOPHAGOGASTRODUODENOSCOPY N/A 01/02/2016   Procedure: ESOPHAGOGASTRODUODENOSCOPY (EGD);  Surgeon: Rogene Houston, MD;  [facility];  Service: Endoscopy;  Laterality: N/A;  240  . INSERT / REPLACE / REMOVE PACEMAKER    . wisdom tooth extracton      Social History   Social History  . Marital status: Divorced    Spouse name: N/A  . Number of children: N/A  . Years of education: N/A   Occupational History  . Not on file.   Social History Main Topics  . Smoking status: Never Smoker  . Smokeless tobacco: Never Used  . Alcohol use No     Comment: 02-13-16 per pt no   . Drug use: No     Comment: 02-13-16 per pt no  . Sexual activity: Not on file   Other Topics Concern  . Not on file   Social History Narrative  . No narrative on file     Vitals:   07/03/16 1148  BP: 120/64  Pulse: 92  SpO2: 96%  Weight: 169 lb (76.7 kg)  Height: 5\' 10"  (1.778 m)    PHYSICAL EXAM General: NAD HEENT: Normal. Neck: No JVD, no thyromegaly. Lungs: Clear to auscultation bilaterally with normal  respiratory effort. CV: Nondisplaced PMI.  Regular rate and irregular rhythm, normal S1/S2, no S3, no murmur. No pretibial or periankle edema.    Abdomen: Soft, nontender, no distention.  Neurologic: Alert and oriented.  Psych: Normal affect. Skin: Normal. Musculoskeletal: No gross deformities.    ECG: Most recent ECG reviewed.      ASSESSMENT AND PLAN: 1. PAF: Flecainide has failed to keep her in NSR based upon 06/15/16 ECG. Does not want to go to a fib clinic. Continue Pradaxa and metoprolol.  2. Tachy-brady syndrome s/p PPM: Stable. Says remote device not working. Will provide device clinic phone number.  3. HTN: Controlled. No changes.  Dispo: fu 1 year.  Kate Sable,  M.D., F.A.C.C.

## 2016-07-03 NOTE — Patient Instructions (Signed)
Your physician wants you to follow-up in: 1 year Dr Koneswaran You will receive a reminder letter in the mail two months in advance. If you don't receive a letter, please call our office to schedule the follow-up appointment.     Your physician recommends that you continue on your current medications as directed. Please refer to the Current Medication list given to you today.     Thank you for choosing [facility] !        

## 2016-07-08 ENCOUNTER — Ambulatory Visit: Payer: Self-pay | Admitting: Cardiovascular Disease

## 2016-07-15 ENCOUNTER — Ambulatory Visit: Payer: Medicare Other | Admitting: Family Medicine

## 2016-07-23 ENCOUNTER — Encounter (HOSPITAL_COMMUNITY): Payer: Self-pay | Admitting: Psychiatry

## 2016-07-23 ENCOUNTER — Ambulatory Visit (INDEPENDENT_AMBULATORY_CARE_PROVIDER_SITE_OTHER): Payer: Medicare Other | Admitting: Psychiatry

## 2016-07-23 VITALS — BP 128/74 | HR 80 | Ht 70.0 in | Wt 174.0 lb

## 2016-07-23 DIAGNOSIS — Z79899 Other long term (current) drug therapy: Secondary | ICD-10-CM | POA: Diagnosis not present

## 2016-07-23 DIAGNOSIS — F322 Major depressive disorder, single episode, severe without psychotic features: Secondary | ICD-10-CM | POA: Diagnosis not present

## 2016-07-23 DIAGNOSIS — Z800 Family history of malignant neoplasm of digestive organs: Secondary | ICD-10-CM

## 2016-07-23 DIAGNOSIS — Z818 Family history of other mental and behavioral disorders: Secondary | ICD-10-CM | POA: Diagnosis not present

## 2016-07-23 MED ORDER — BUSPIRONE HCL 10 MG PO TABS: 10.0000 mg | ORAL_TABLET | Freq: Three times a day (TID) | ORAL | 2 refills | Status: DC

## 2016-07-23 MED ORDER — DULOXETINE HCL 60 MG PO CPEP: 60.0000 mg | ORAL_CAPSULE | Freq: Two times a day (BID) | ORAL | 2 refills | Status: DC
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
## 2016-07-24 ENCOUNTER — Ambulatory Visit: Payer: Medicare Other | Admitting: "Endocrinology

## 2016-07-28 ENCOUNTER — Other Ambulatory Visit: Payer: Self-pay | Admitting: Family Medicine

## 2016-08-03 ENCOUNTER — Ambulatory Visit (INDEPENDENT_AMBULATORY_CARE_PROVIDER_SITE_OTHER): Payer: Medicare Other | Admitting: Family Medicine

## 2016-08-03 ENCOUNTER — Encounter: Payer: Self-pay | Admitting: Family Medicine

## 2016-08-03 VITALS — BP 128/82 | Ht 69.0 in | Wt 176.8 lb

## 2016-08-03 DIAGNOSIS — M170 Bilateral primary osteoarthritis of knee: Secondary | ICD-10-CM | POA: Diagnosis not present

## 2016-08-03 DIAGNOSIS — G4700 Insomnia, unspecified: Secondary | ICD-10-CM | POA: Diagnosis not present

## 2016-08-03 DIAGNOSIS — I480 Paroxysmal atrial fibrillation: Secondary | ICD-10-CM | POA: Diagnosis not present

## 2016-08-03 MED ORDER — HYDROCODONE-ACETAMINOPHEN 5-325 MG PO TABS: 1.0000 | ORAL_TABLET | ORAL | 0 refills | Status: DC | PRN

## 2016-08-03 MED ORDER — ALPRAZOLAM 1 MG PO TABS: ORAL_TABLET | ORAL | 5 refills | Status: DC

## 2016-08-03 NOTE — Progress Notes (Signed)
   Subjective:    Patient ID: Kelsey Sandoval, female    DOB: [birth year], 82 y.o.   MRN: [id number]  HPI  Patient arrives for a follow up on anxiety. Patient states Dr Harrington Challenger thinks she is having panic attacks. Patient states she needs her xanax increased back to a whole pill at bedtime. Patient also concerned about chronic dark places on legs. The patient takes blood thinner because of her atrial fibrillation. She denies any bleeding into her stools. Patient does relate moderate insomnia at nighttime has tried using just a half a Xanax at night has a hard time sleeping She occasionally has anxiety during the day but her psychiatrist put her on BuSpar which seems to be helping her. The patient relates that pain medicine helps her function. Without it she would have a very difficult time. She cannot take anti-inflammatories because of the blood thinner she denies a medicine causing her drowsiness The patient does have chronic pain in her lower back with sciatica and osteoarthritis of the knees she needs prescriptions on her pain medicine she uses up to 5 per day she denies abusing it Review of Systems Denies chest tightness pressure pain shortness breath vomiting fevers chills rectal bleeding    Objective:   Physical Exam Lungs clear heart irregular rate controlled patient has darkened areas on the arms and legs which are consistent with some staining of the skin from micro bruising.       Assessment & Plan:  Chronic pain-patient denies abusing the medication 3 prescriptions are written she will follow-up toward the end of January  Insomnia Xanax 1 mg daily at bedtime she is been on this for years prescription was given. Hopefully this will help her  Bruising in the skin not severe. This is what is expected for her age plus pain on the medication she is on.  Osteoarthritis of the knees-pain medicine helps her

## 2016-08-06 ENCOUNTER — Emergency Department (HOSPITAL_COMMUNITY): Payer: Medicare Other

## 2016-08-06 ENCOUNTER — Telehealth: Payer: Self-pay | Admitting: Family Medicine

## 2016-08-06 ENCOUNTER — Encounter (HOSPITAL_COMMUNITY): Payer: Self-pay | Admitting: Emergency Medicine

## 2016-08-06 ENCOUNTER — Ambulatory Visit: Payer: Medicare Other | Admitting: Family Medicine

## 2016-08-06 ENCOUNTER — Emergency Department (HOSPITAL_COMMUNITY)
Admission: EM | Admit: 2016-08-06 | Discharge: 2016-08-06 | Disposition: A | Discharge status: Home / Self Care | Payer: Medicare Other | Attending: Emergency Medicine | Admitting: Emergency Medicine

## 2016-08-06 DIAGNOSIS — J209 Acute bronchitis, unspecified: Secondary | ICD-10-CM | POA: Insufficient documentation

## 2016-08-06 DIAGNOSIS — I10 Essential (primary) hypertension: Secondary | ICD-10-CM | POA: Insufficient documentation

## 2016-08-06 DIAGNOSIS — R05 Cough: Secondary | ICD-10-CM | POA: Diagnosis present

## 2016-08-06 DIAGNOSIS — E039 Hypothyroidism, unspecified: Secondary | ICD-10-CM | POA: Insufficient documentation

## 2016-08-06 DIAGNOSIS — R0602 Shortness of breath: Secondary | ICD-10-CM | POA: Insufficient documentation

## 2016-08-06 DIAGNOSIS — Z79899 Other long term (current) drug therapy: Secondary | ICD-10-CM | POA: Insufficient documentation

## 2016-08-06 DIAGNOSIS — E860 Dehydration: Secondary | ICD-10-CM | POA: Diagnosis not present

## 2016-08-06 DIAGNOSIS — J181 Lobar pneumonia, unspecified organism: Secondary | ICD-10-CM | POA: Diagnosis not present

## 2016-08-06 LAB — COMPREHENSIVE METABOLIC PANEL
ALT: 16 U/L (ref 14–54)
ANION GAP: 8 (ref 5–15)
AST: 29 U/L (ref 15–41)
Albumin: 3.7 g/dL (ref 3.5–5.0)
Alkaline Phosphatase: 66 U/L (ref 38–126)
BILIRUBIN TOTAL: 0.7 mg/dL (ref 0.3–1.2)
BUN: 25 mg/dL — ABNORMAL HIGH (ref 6–20)
CO2: 24 mmol/L (ref 22–32)
Calcium: 9.3 mg/dL (ref 8.9–10.3)
Chloride: 107 mmol/L (ref 101–111)
Creatinine, Ser: 1.23 mg/dL — ABNORMAL HIGH (ref 0.44–1.00)
GFR calc Af Amer: 46 mL/min — ABNORMAL LOW (ref >60)
GFR, EST NON AFRICAN AMERICAN: 40 mL/min — AB (ref >60)
Glucose, Bld: 101 mg/dL — ABNORMAL HIGH (ref 65–99)
POTASSIUM: 3.7 mmol/L (ref 3.5–5.1)
Sodium: 139 mmol/L (ref 135–145)
TOTAL PROTEIN: 7 g/dL (ref 6.5–8.1)

## 2016-08-06 LAB — URINALYSIS, ROUTINE W REFLEX MICROSCOPIC
BILIRUBIN URINE: NEGATIVE
GLUCOSE, UA: NEGATIVE mg/dL
Ketones, ur: NEGATIVE mg/dL
Leukocytes, UA: NEGATIVE
Nitrite: NEGATIVE
Protein, ur: >300 mg/dL — AB
SPECIFIC GRAVITY, URINE: 1.015 (ref 1.005–1.030)
pH: 6 (ref 5.0–8.0)

## 2016-08-06 LAB — URINE MICROSCOPIC-ADD ON

## 2016-08-06 LAB — CBC WITH DIFFERENTIAL/PLATELET
BASOS ABS: 0 10*3/uL (ref 0.0–0.1)
BASOS PCT: 0 %
EOS PCT: 1 %
Eosinophils Absolute: 0.1 10*3/uL (ref 0.0–0.7)
HEMATOCRIT: 38 % (ref 36.0–46.0)
Hemoglobin: 12.3 g/dL (ref 12.0–15.0)
LYMPHS PCT: 13 %
Lymphs Abs: 1.6 10*3/uL (ref 0.7–4.0)
MCH: 30.6 pg (ref 26.0–34.0)
MCHC: 32.4 g/dL (ref 30.0–36.0)
MCV: 94.5 fL (ref 78.0–100.0)
Monocytes Absolute: 1 10*3/uL (ref 0.1–1.0)
Monocytes Relative: 9 %
NEUTROS ABS: 9.2 10*3/uL — AB (ref 1.7–7.7)
Neutrophils Relative %: 77 %
PLATELETS: 231 10*3/uL (ref 150–400)
RBC: 4.02 MIL/uL (ref 3.87–5.11)
RDW: 13.2 % (ref 11.5–15.5)
WBC: 11.8 10*3/uL — AB (ref 4.0–10.5)

## 2016-08-06 LAB — BRAIN NATRIURETIC PEPTIDE: B Natriuretic Peptide: 646 pg/mL — ABNORMAL HIGH (ref 0.0–100.0)

## 2016-08-06 LAB — TROPONIN I: Troponin I: <0.03 ng/mL (ref <0.03)

## 2016-08-06 MED ORDER — DEXTROSE 5 % IV SOLN
1.0000 g | Freq: Once | INTRAVENOUS | Status: AC
  Administered 2016-08-06: 1 g via INTRAVENOUS
  Filled 2016-08-06: qty 10

## 2016-08-06 MED ORDER — PREDNISONE 10 MG (21) PO TBPK: 10.0000 mg | ORAL_TABLET | Freq: Every day | ORAL | 0 refills | Status: DC

## 2016-08-06 MED ORDER — AEROCHAMBER PLUS FLO-VU MEDIUM MISC
1.0000 | Freq: Once | Status: AC
  Administered 2016-08-06: 1
  Filled 2016-08-06: qty 1

## 2016-08-06 MED ORDER — CEFDINIR 300 MG PO CAPS: 300.0000 mg | ORAL_CAPSULE | Freq: Two times a day (BID) | ORAL | 0 refills | Status: DC

## 2016-08-06 MED ORDER — METHYLPREDNISOLONE SODIUM SUCC 125 MG IJ SOLR
125.0000 mg | Freq: Once | INTRAMUSCULAR | Status: AC
  Administered 2016-08-06: 125 mg via INTRAVENOUS
  Filled 2016-08-06: qty 2

## 2016-08-06 MED ORDER — ALBUTEROL SULFATE (2.5 MG/3ML) 0.083% IN NEBU
5.0000 mg | INHALATION_SOLUTION | Freq: Once | RESPIRATORY_TRACT | Status: AC
Start: 2016-08-06 — End: 2016-08-06
  Administered 2016-08-06: 5 mg via RESPIRATORY_TRACT
  Filled 2016-08-06: qty 6

## 2016-08-06 MED ORDER — ALBUTEROL SULFATE HFA 108 (90 BASE) MCG/ACT IN AERS
1.0000 | INHALATION_SPRAY | RESPIRATORY_TRACT | Status: DC | PRN
  Administered 2016-08-06: 2 via RESPIRATORY_TRACT
  Filled 2016-08-06: qty 6.7

## 2016-08-06 MED ORDER — SODIUM CHLORIDE 0.9 % IV BOLUS (SEPSIS)
1000.0000 mL | Freq: Once | INTRAVENOUS | Status: AC
  Administered 2016-08-06: 1000 mL via INTRAVENOUS

## 2016-08-06 NOTE — Telephone Encounter (Signed)
The patient was seen in the ER for possible pneumonia diagnosed with bronchitis put on antibiotics she was instructed to follow-up with our office within 48 hours if she calls on Friday to be seen we need to see her. You may have to get the nurses involved if we do not have any open slots thank you

## 2016-08-06 NOTE — ED Notes (Signed)
Pt ambulated with cane about 50 feet, RA sats around 93-95%.

## 2016-08-06 NOTE — ED Notes (Signed)
Returned from xray

## 2016-08-06 NOTE — ED Notes (Signed)
Patient transported to X-ray 

## 2016-08-06 NOTE — ED Triage Notes (Signed)
Pt seen at Urgent Care today. Pt dx with RLL pneumonia and dehydration.

## 2016-08-06 NOTE — ED Provider Notes (Signed)
[facility] DEPT Provider Note   CSN: [id number] Arrival date & time: 08/06/16  1448     History   Chief Complaint Chief Complaint  Patient presents with  . Pneumonia    HPI Kelsey Sandoval is a 82 y.o. female.  Pt presents to the ED today with sob that has been going on all week.  Pt had a cough that was productive, but now, she can't get anything up.  She said that she went to urgent care who told her that she had rll pneumonia and that she was dehydrated.  The pt said that urgent care did not give her any medications, but sent her here.  Pt reports no smoking history and no history of lung problems.  Pt does have a.fib (CHADVASC score 4 for age, sex, htn) and is on pradaxa.  She denies cp or palpitations.      Past Medical History:  Diagnosis Date  . Anxiety   . Arthritis   . Atrial fibrillation ([place])   . Back pain, chronic   . Cataracts, bilateral   . Depression   . Dysrhythmia   . Hyperlipidemia   . Hypertension   . Membranous nephropathy determined by biopsy 06/07/2015  . Nephrotic syndrome    RHUX  . Osteopenia   . Pacemaker   . Pre-diabetes   . Urine protein increased     Patient Active Problem List   Diagnosis Date Noted  . Hereditary and idiopathic peripheral neuropathy 03/05/2016  . Major depression 12/04/2015  . Major depression in remission ([place]) 09/02/2015  . Membranous nephropathy determined by biopsy 06/07/2015  . Osteoarthritis of both knees 03/26/2015  . Chronic pain syndrome 03/26/2015  . Intractable nausea and vomiting   . Nausea with vomiting   . Malnutrition of moderate degree ([place]) 03/08/2015  . CAP (community acquired pneumonia) 03/08/2015  . UTI (lower urinary tract infection) 03/07/2015  . Nausea & vomiting 03/07/2015  . Hypothyroidism 02/14/2015  . Disorder of kidney 01/17/2015  . Nephrotic syndrome 06/21/2014  . Proteinuria 03/29/2014  . Osteopenia 03/29/2014  . Anemia, iron deficiency 08/22/2013  . Mitral insufficiency  06/11/2013  . Pacemaker 06/11/2013  . Paroxysmal atrial fibrillation ([place]) 09/26/2012  . Tachycardia-bradycardia syndrome ([place]) 09/26/2012  . Sick sinus syndrome ([place]) 09/26/2012  . Systemic hypertension 09/26/2012  . Dyslipidemia 09/26/2012    Past Surgical History:  Procedure Laterality Date  . ABDOMINAL HYSTERECTOMY     partial-pt has no ovaries  . APPENDECTOMY    . BACK SURGERY    . BIOPSY N/A 01/17/2014   Procedure: BIOPSY;  Surgeon: Rogene Houston, MD;  Location: [facility];  Service: Endoscopy;  Laterality: N/A;  . CATARACT EXTRACTION W/PHACO Left 02/28/2015   Procedure: CATARACT EXTRACTION PHACO AND INTRAOCULAR LENS PLACEMENT (IOC);  Surgeon: [name], MD;  Location: [facility];  Service: Ophthalmology;  Laterality: Left;  CDE 18.71  . COLONOSCOPY  9/05  . COLONOSCOPY N/A 10/11/2013   Procedure: COLONOSCOPY;  Surgeon: Rogene Houston, MD;  Location: [facility];  Service: Endoscopy;  Laterality: N/A;  1200  . dual chamber pacemaker     2012  . ESOPHAGOGASTRODUODENOSCOPY N/A 01/17/2014   Procedure: ESOPHAGOGASTRODUODENOSCOPY (EGD);  Surgeon: Rogene Houston, MD;  Location: [facility];  Service: Endoscopy;  Laterality: N/A;  230  . ESOPHAGOGASTRODUODENOSCOPY N/A 01/02/2016   Procedure: ESOPHAGOGASTRODUODENOSCOPY (EGD);  Surgeon: Rogene Houston, MD;  Location: [facility];  Service: Endoscopy;  Laterality: N/A;  240  . INSERT / REPLACE /  REMOVE PACEMAKER    . wisdom tooth extracton      OB History    Gravida Para Term Preterm AB Living   2 2 2          SAB TAB Ectopic Multiple Live Births                   Home Medications    Prior to Admission medications   Medication Sig Start Date End Date Taking? Authorizing Provider  albuterol (PROVENTIL HFA;VENTOLIN HFA) 108 (90 BASE) MCG/ACT inhaler Inhale 2 puffs into the lungs every 6 (six) hours as needed for wheezing or shortness of breath. 09/17/14  Yes Kathyrn Drown, MD  ALPRAZolam Duanne [place]) 1 MG tablet 1 qhs prn  insomnia 08/03/16  Yes Kathyrn Drown, MD  amLODipine (NORVASC) 10 MG tablet Take 1 tablet (10 mg total) by mouth daily. 01/28/16  Yes Kathyrn Drown, MD  busPIRone (BUSPAR) 10 MG tablet Take 1 tablet (10 mg total) by mouth 3 (three) times daily. 07/23/16  Yes Cloria Spring, MD  dabigatran (PRADAXA) 150 MG CAPS capsule Take 1 capsule (150 mg total) by mouth 2 (two) times daily. 07/03/16  Yes Herminio Commons, MD  DULoxetine (CYMBALTA) 60 MG capsule Take 1 capsule (60 mg total) by mouth 2 (two) times daily. 07/23/16 07/23/17 Yes Cloria Spring, MD  flecainide (TAMBOCOR) 50 MG tablet Take 1 tablet (50 mg total) by mouth 2 (two) times daily. 07/03/16  Yes Herminio Commons, MD  gabapentin (NEURONTIN) 300 MG capsule 1 in the am and one in the evening 06/19/16  Yes Kathyrn Drown, MD  gabapentin (NEURONTIN) 300 MG capsule TAKE ONE CAPSULE BY MOUTH THREE TIMES DAILY **NEW DOSING** 07/29/16  Yes Kathyrn Drown, MD  HYDROcodone-acetaminophen (NORCO/VICODIN) 5-325 MG tablet Take 1 tablet by mouth every 4 (four) hours as needed. 08/03/16  Yes Kathyrn Drown, MD  levothyroxine (SYNTHROID, LEVOTHROID) 50 MCG tablet TAKE ONE TABLET BY MOUTH ONCE DAILY **CHANGE  IN  DOSE** 01/28/16  Yes Kathyrn Drown, MD  lisinopril (PRINIVIL,ZESTRIL) 40 MG tablet Take 1 tablet (40 mg total) by mouth daily. 09/02/15  Yes Kathyrn Drown, MD  metoprolol succinate (TOPROL-XL) 50 MG 24 hr tablet TAKE ONE TABLET BY MOUTH ONCE DAILY WITH  OR  IMMEDIATELY  FOLLOWING  A  MEAL 09/02/15  Yes Kathyrn Drown, MD  ondansetron (ZOFRAN-ODT) 8 MG disintegrating tablet Take 1 tablet (8 mg total) by mouth every 8 (eight) hours as needed for nausea or vomiting. 12/24/15  Yes Kathyrn Drown, MD  pantoprazole (PROTONIX) 40 MG tablet Take 1 tablet (40 mg total) by mouth daily. Reported on 01/28/2016 01/28/16  Yes Kathyrn Drown, MD  pravastatin (PRAVACHOL) 80 MG tablet Take 1 tablet (80 mg total) by mouth daily. 09/02/15  Yes Kathyrn Drown, MD    riTUXimab in sodium chloride 0.9 % 250 mL Infusion 2 weeks apart: May repeat in 6 months. Follow up with primary care doctor prior to initiation of rituximab for infectious clearance 03/11/15  Yes Kathie Dike, MD  vitamin B-12 (CYANOCOBALAMIN) 1000 MCG tablet Take 1 tablet (1,000 mcg total) by mouth daily. 03/11/15  Yes Kathie Dike, MD  cefdinir (OMNICEF) 300 MG capsule Take 1 capsule (300 mg total) by mouth 2 (two) times daily. 08/06/16   Isla Pence, MD  predniSONE (STERAPRED UNI-PAK 21 TAB) 10 MG (21) TBPK tablet Take 1 tablet (10 mg total) by mouth daily. Take 6 tabs by mouth daily  for 2 days, then 5 tabs for 2 days, then 4 tabs for 2 days, then 3 tabs for 2 days, 2 tabs for 2 days, then 1 tab by mouth daily for 2 days 08/06/16   Isla Pence, MD    Family History Family History  Problem Relation Age of Onset  . Colon cancer Brother   . Anxiety disorder Sister   . Depression Sister   . Anxiety disorder Sister   . Depression Sister     Social History Social History  Substance Use Topics  . Smoking status: Never Smoker  . Smokeless tobacco: Never Used  . Alcohol use No     Comment: 02-13-16 per pt no      Allergies   Penicillins; Levaquin [levofloxacin]; Sulfa antibiotics; and Zithromax [azithromycin]   Review of Systems Review of Systems  Respiratory: Positive for cough and shortness of breath.   All other systems reviewed and are negative.    Physical Exam Updated Vital Signs BP 165/95   Pulse 88   Temp 98.2 F (36.8 C) (Oral)   Resp 23   Ht 5\' 9"  (1.753 m)   Wt 176 lb 12.8 oz (80.2 kg)   SpO2 99%   BMI 26.11 kg/m   Physical Exam  Constitutional: She is oriented to person, place, and time. She appears well-developed and well-nourished.  HENT:  Head: Normocephalic and atraumatic.  Right Ear: External ear normal.  Left Ear: External ear normal.  Nose: Nose normal.  Mouth/Throat: Oropharynx is clear and moist.  Eyes: Conjunctivae and EOM are normal.  Pupils are equal, round, and reactive to light.  Neck: Normal range of motion. Neck supple.  Cardiovascular: Normal rate, regular rhythm, normal heart sounds and intact distal pulses.   Pulmonary/Chest: Effort normal. She has wheezes.  Abdominal: Soft. Bowel sounds are normal.  Musculoskeletal: Normal range of motion.  Neurological: She is alert and oriented to person, place, and time.  Skin: Skin is warm.  Psychiatric: She has a normal mood and affect. Her behavior is normal. Judgment and thought content normal.  Nursing note and vitals reviewed.    ED Treatments / Results  Labs (all labs ordered are listed, but only abnormal results are displayed) Labs Reviewed  COMPREHENSIVE METABOLIC PANEL - Abnormal; Notable for the following:       Result Value   Glucose, Bld 101 (*)    BUN 25 (*)    Creatinine, Ser 1.23 (*)    GFR calc non Af Amer 40 (*)    GFR calc Af Amer 46 (*)    All other components within normal limits  CBC WITH DIFFERENTIAL/PLATELET - Abnormal; Notable for the following:    WBC 11.8 (*)    Neutro Abs 9.2 (*)    All other components within normal limits  BRAIN NATRIURETIC PEPTIDE - Abnormal; Notable for the following:    B Natriuretic Peptide 646.0 (*)    All other components within normal limits  URINALYSIS, ROUTINE W REFLEX MICROSCOPIC (NOT AT [facility]) - Abnormal; Notable for the following:    Hgb urine dipstick TRACE (*)    Protein, ur >300 (*)    All other components within normal limits  URINE MICROSCOPIC-ADD ON - Abnormal; Notable for the following:    Squamous Epithelial / LPF 0-5 (*)    Bacteria, UA RARE (*)    All other components within normal limits  TROPONIN I    EKG  EKG Interpretation  Date/Time:  Thursday August 06 2016 15:25:16 EDT Ventricular  Rate:  83 PR Interval:    QRS Duration: 92 QT Interval:  362 QTC Calculation: 426 R Axis:   38 Text Interpretation:  Atrial fibrillation Repol abnrm suggests ischemia, anterolateral new t wave  inversions laterally since september 2017 Confirmed by [facility] MD, Aubriegh Minch [id number]) on 08/06/2016 3:32:16 PM       Radiology Dg Chest 2 View  Result Date: 08/06/2016 CLINICAL DATA:  Cough, chest congestion and shortness of breath. EXAM: CHEST  2 VIEW COMPARISON:  06/15/2016. FINDINGS: The cardiac silhouette remains borderline enlarged. Stable right subclavian pacemaker leads. The lungs are clear with stable hyperexpansion of the lungs and prominence of the interstitial markings. The questionable nodular density adjacent to the left heart border appears less nodular today and more like adjacent vessels. Thoracic spine degenerative changes. IMPRESSION: No acute abnormality. Stable changes of COPD and borderline cardiomegaly. Electronically Signed   By: Claudie Revering M.D.   On: 08/06/2016 15:53    Procedures Procedures (including critical care time)  Medications Ordered in ED Medications  albuterol (PROVENTIL HFA;VENTOLIN HFA) 108 (90 Base) MCG/ACT inhaler 1-2 puff (not administered)  AEROCHAMBER PLUS FLO-VU MEDIUM MISC 1 each (not administered)  albuterol (PROVENTIL) (2.5 MG/3ML) 0.083% nebulizer solution 5 mg (5 mg Nebulization Given 08/06/16 1530)  sodium chloride 0.9 % bolus 1,000 mL (0 mLs Intravenous Stopped 08/06/16 1640)  methylPREDNISolone sodium succinate (SOLU-MEDROL) 125 mg/2 mL injection 125 mg (125 mg Intravenous Given 08/06/16 1611)  cefTRIAXone (ROCEPHIN) 1 g in dextrose 5 % 50 mL IVPB (1 g Intravenous New Bag/Given 08/06/16 1640)     Initial Impression / Assessment and Plan / ED Course  I have reviewed the triage vital signs and the nursing notes.  Pertinent labs & imaging results that were available during my care of the patient were reviewed by me and considered in my medical decision making (see chart for details).  Clinical Course    Pt is feeling better.  Her oxygen sats are 93-95 with ambulation.  She has no evidence of pna on our cxr.  Pt was given an albuterol neb, 125  solumedrol, and 1g rocephin here.  She will be d/c'd home on oral prednisone and cefdinir (as she is allergic to pcn, zithromax, levaquin, but can take cephalosporins).  Pt is given an additional albuterol inhaler here as she is low at home.  She knows to return if worse and to f/u with her pcp.  Final Clinical Impressions(s) / ED Diagnoses   Final diagnoses:  Acute bronchitis, unspecified organism    New Prescriptions New Prescriptions   CEFDINIR (OMNICEF) 300 MG CAPSULE    Take 1 capsule (300 mg total) by mouth 2 (two) times daily.   PREDNISONE (STERAPRED UNI-PAK 21 TAB) 10 MG (21) TBPK TABLET    Take 1 tablet (10 mg total) by mouth daily. Take 6 tabs by mouth daily  for 2 days, then 5 tabs for 2 days, then 4 tabs for 2 days, then 3 tabs for 2 days, 2 tabs for 2 days, then 1 tab by mouth daily for 2 days     Isla Pence, MD 08/06/16 1729

## 2016-08-10 ENCOUNTER — Ambulatory Visit (INDEPENDENT_AMBULATORY_CARE_PROVIDER_SITE_OTHER): Payer: Medicare Other | Admitting: Family Medicine

## 2016-08-10 ENCOUNTER — Encounter: Payer: Self-pay | Admitting: Family Medicine

## 2016-08-10 VITALS — BP 128/82 | Ht 69.0 in | Wt 173.0 lb

## 2016-08-10 DIAGNOSIS — J209 Acute bronchitis, unspecified: Secondary | ICD-10-CM | POA: Diagnosis not present

## 2016-08-10 NOTE — Progress Notes (Signed)
   Subjective:    Patient ID: Kelsey Sandoval, female    DOB: 28-Jan-1933, 82 y.o.   MRN: [id number]  HPI Patient arrives for a follow up from a recent ER visit for bronchitis. Patient currently on prednisone and omnicef This patient relates a fair amount of congestion cough drainage denies high fever chills sweats states her energy level is doing better. No vomiting or diarrhea.  Review of Systems See above.    Objective:   Physical Exam Lungs clear cough noted no rails or rhonchi HEENT benign       Assessment & Plan:  Recent infection and illness seems to be doing better continue albuterol when necessary continue steroids as well as antibiotics follow-up here if progressive troubles keep all regular follow-up visits

## 2016-08-11 IMAGING — DX DG CHEST 2V
2 series · 2 of 2 positions shown · non-contrast
Comparison: PA and lateral chest x-ray July 13, 2013

CLINICAL DATA: Hypertension, mitral but insufficiency, nephrotic
syndrome, COPD peer

EXAM:
CHEST  2 VIEW

[chest pa]
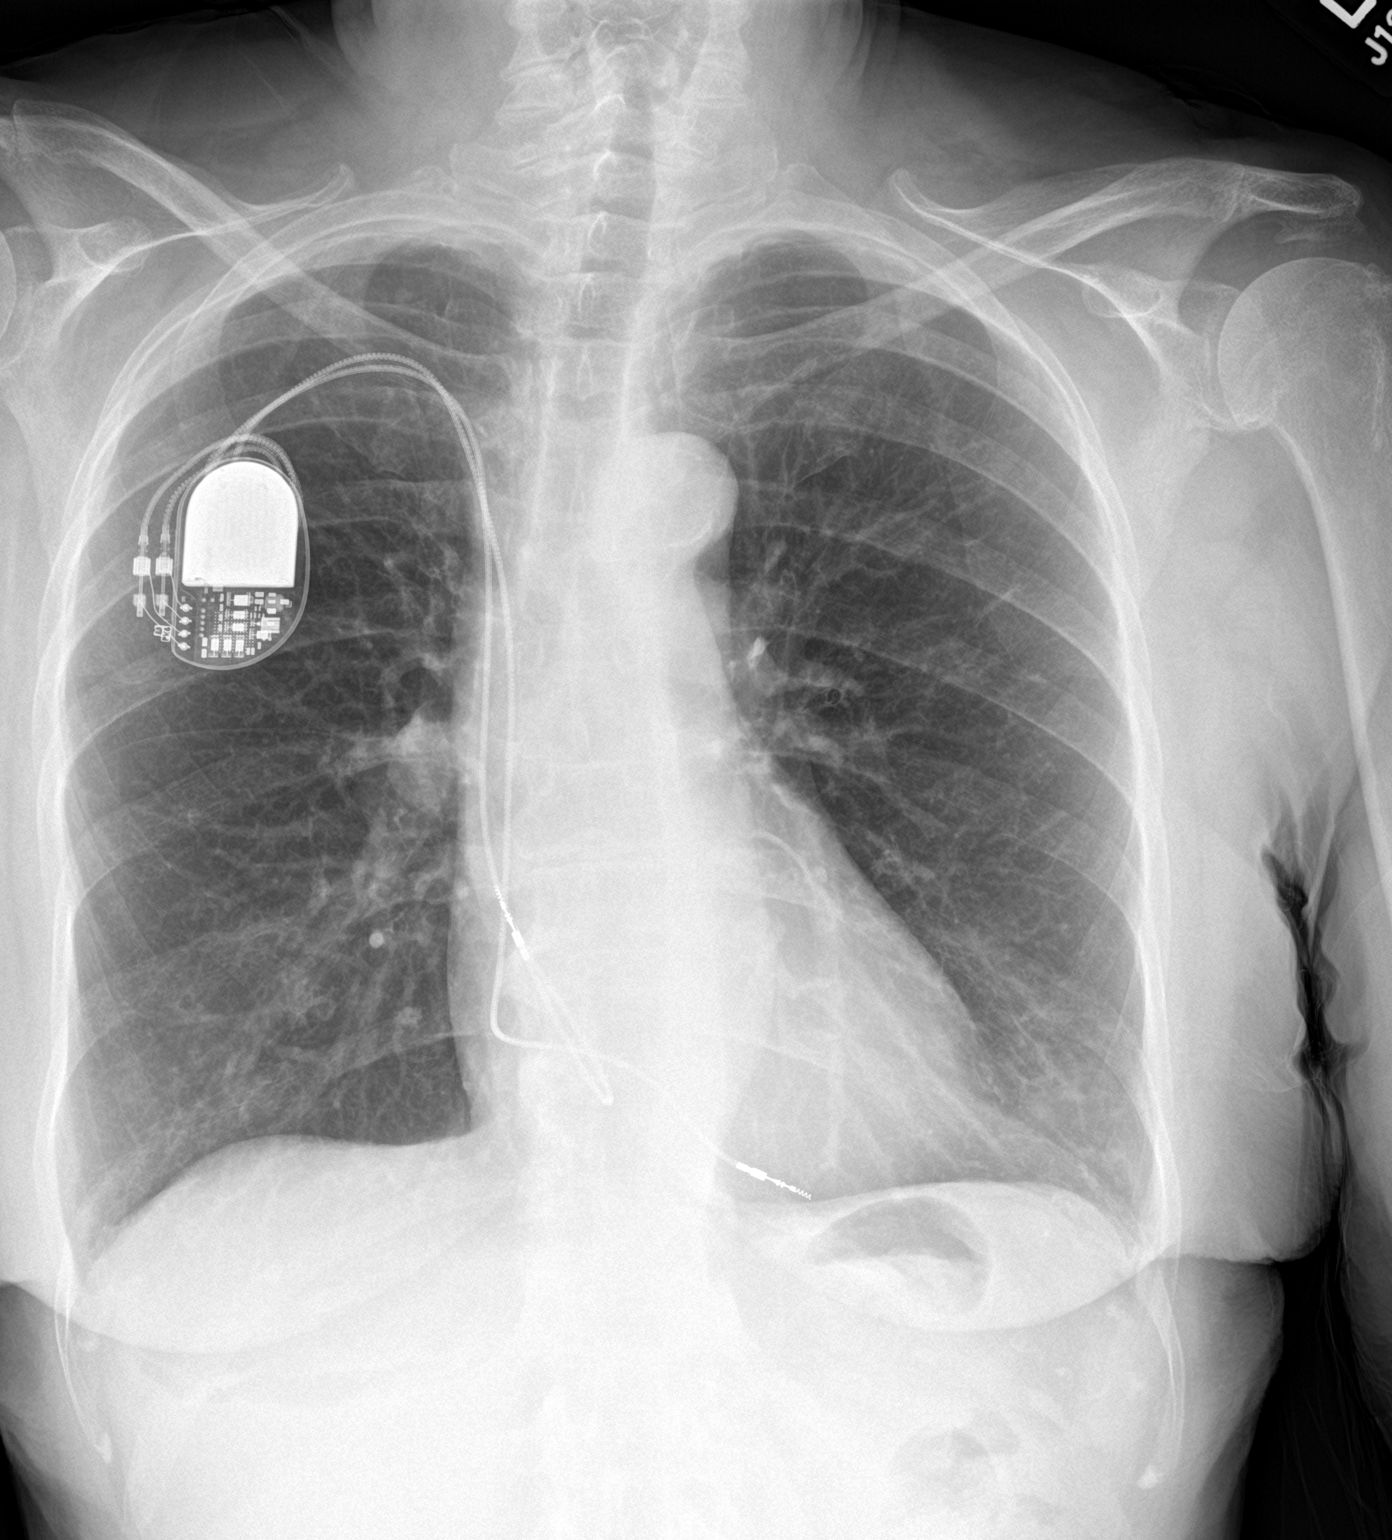

[chest lat]
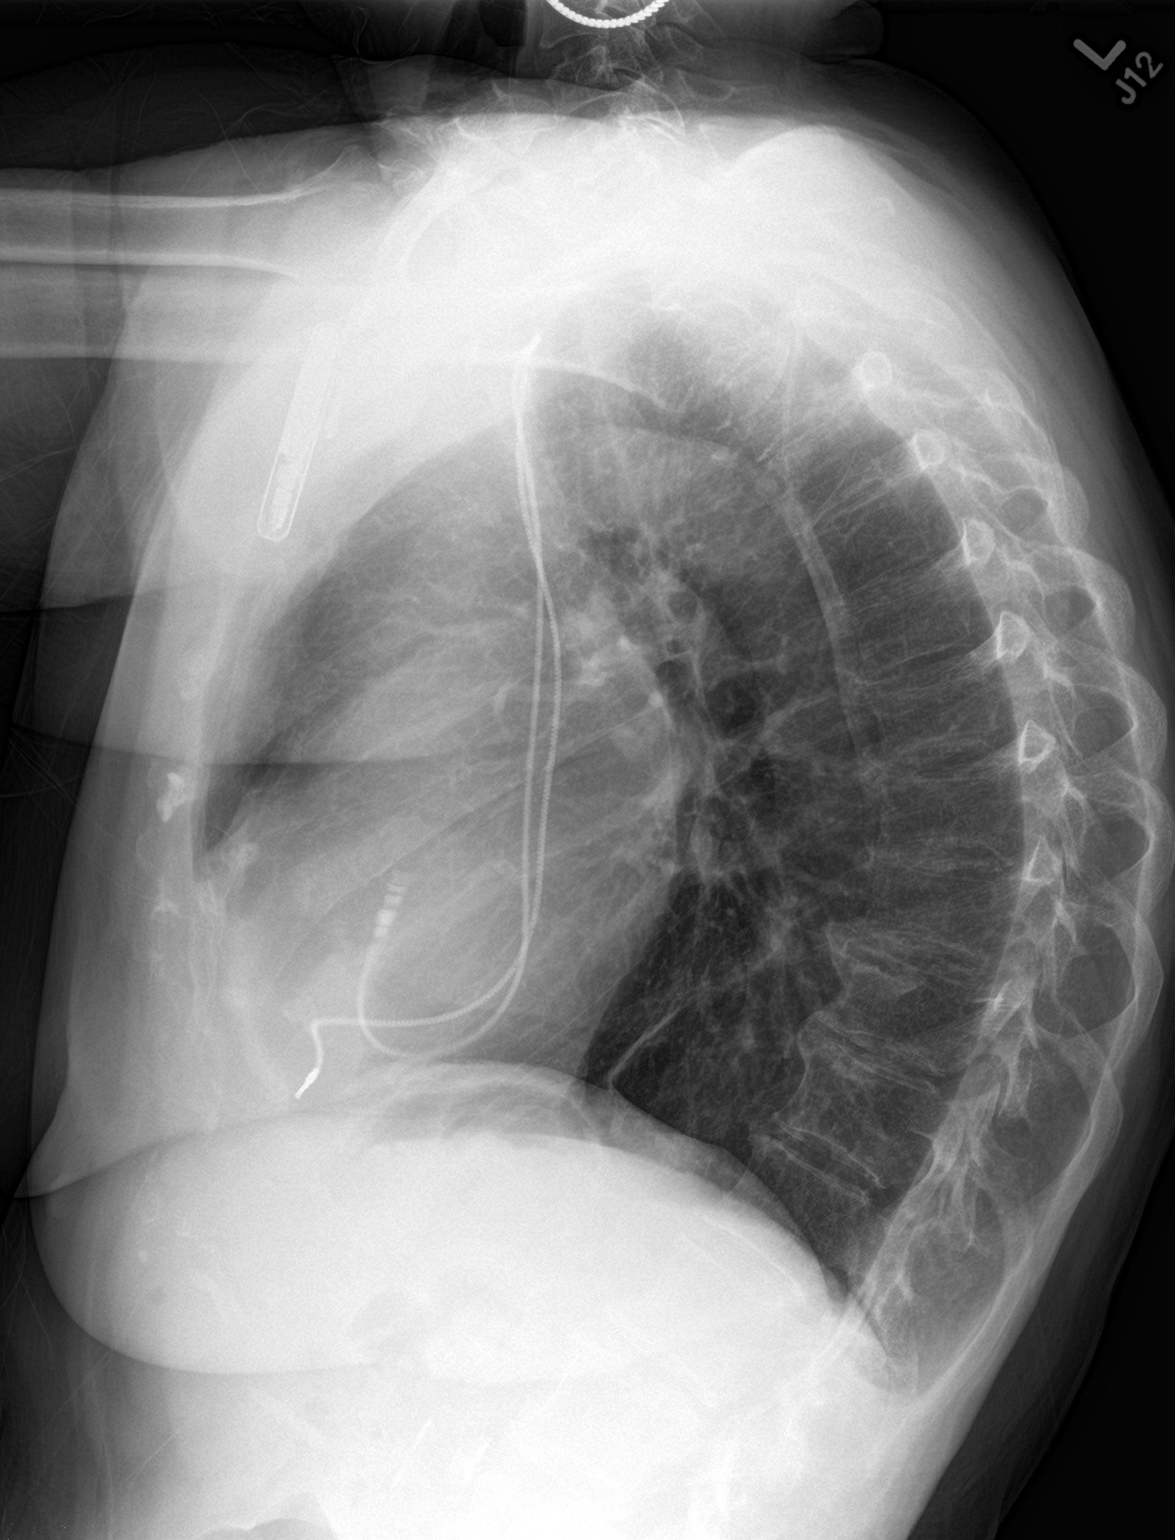

[2 of 2 positions shown; findings below may reference images not displayed]

FINDINGS: The lungs remain hyperinflated with hemidiaphragm flattening. There
is no focal infiltrate. There is no pleural effusion or
pneumothorax. The heart and pulmonary vascularity are normal. The
permanent pacemaker is unchanged in position. There is tortuosity of
the descending thoracic aorta. The bony thorax exhibits no acute
abnormality.
IMPRESSION: COPD.  There is no active cardiopulmonary disease.

## 2016-08-25 ENCOUNTER — Encounter: Payer: Self-pay | Admitting: Family Medicine

## 2016-08-25 ENCOUNTER — Ambulatory Visit (INDEPENDENT_AMBULATORY_CARE_PROVIDER_SITE_OTHER): Payer: Medicare Other | Admitting: Family Medicine

## 2016-08-25 VITALS — BP 122/84 | Temp 98.1°F | Ht 69.0 in | Wt 180.2 lb

## 2016-08-25 DIAGNOSIS — J019 Acute sinusitis, unspecified: Secondary | ICD-10-CM

## 2016-08-25 DIAGNOSIS — J45909 Unspecified asthma, uncomplicated: Secondary | ICD-10-CM

## 2016-08-25 DIAGNOSIS — J209 Acute bronchitis, unspecified: Secondary | ICD-10-CM

## 2016-08-25 MED ORDER — PREDNISONE 20 MG PO TABS: ORAL_TABLET | ORAL | 0 refills | Status: DC

## 2016-08-25 MED ORDER — DOXYCYCLINE HYCLATE 100 MG PO CAPS: 100.0000 mg | ORAL_CAPSULE | Freq: Two times a day (BID) | ORAL | 0 refills | Status: DC

## 2016-08-25 NOTE — Progress Notes (Signed)
   Subjective:    Patient ID: Kelsey Sandoval, female    DOB: 04-25-1933, 82 y.o.   MRN: [id number]  Cough  This is a new problem. The current episode started yesterday. Associated symptoms include headaches, nasal congestion, rhinorrhea and wheezing. Pertinent negatives include no chest pain, ear pain, fever or shortness of breath.   Viral like illness over the past few days now with head congestion sinus pressure pain as well as chest congestion denies shortness of breath relates occasional wheeze   Review of Systems  Constitutional: Negative for activity change and fever.  HENT: Positive for congestion and rhinorrhea. Negative for ear pain.   Eyes: Negative for discharge.  Respiratory: Positive for cough and wheezing. Negative for shortness of breath.   Cardiovascular: Negative for chest pain.  Gastrointestinal: Negative for diarrhea, nausea and vomiting.  Neurological: Positive for headaches.       Objective:   Physical Exam  Constitutional: She appears well-developed.  HENT:  Head: Normocephalic.  Nose: Nose normal.  Mouth/Throat: Oropharynx is clear and moist. No oropharyngeal exudate.  Neck: Neck supple.  Cardiovascular: Normal rate and normal heart sounds.   No murmur heard. Pulmonary/Chest: Effort normal and breath sounds normal. She has no wheezes.  Lymphadenopathy:    She has no cervical adenopathy.  Skin: Skin is warm and dry.  Nursing note and vitals reviewed.  O2 sat 97%       Assessment & Plan:  Significant bronchitis Patient at high risk of developing pneumonia Warning signs discuss Antibiotics prescribed Start prednisone if wheezing kicks in Secondary acute rhinosinusitis

## 2016-09-07 ENCOUNTER — Telehealth: Payer: Self-pay

## 2016-09-07 ENCOUNTER — Inpatient Hospital Stay (HOSPITAL_COMMUNITY)
Admission: EM | Admit: 2016-09-07 | Discharge: 2016-09-11 | DRG: 194 | Disposition: A | Discharge status: Home / Self Care | Payer: Medicare Other | Attending: Internal Medicine | Admitting: Internal Medicine

## 2016-09-07 ENCOUNTER — Emergency Department (HOSPITAL_COMMUNITY): Payer: Medicare Other

## 2016-09-07 ENCOUNTER — Encounter (HOSPITAL_COMMUNITY): Payer: Self-pay

## 2016-09-07 DIAGNOSIS — F419 Anxiety disorder, unspecified: Secondary | ICD-10-CM | POA: Diagnosis present

## 2016-09-07 DIAGNOSIS — Z950 Presence of cardiac pacemaker: Secondary | ICD-10-CM

## 2016-09-07 DIAGNOSIS — Z7901 Long term (current) use of anticoagulants: Secondary | ICD-10-CM | POA: Diagnosis not present

## 2016-09-07 DIAGNOSIS — Z79899 Other long term (current) drug therapy: Secondary | ICD-10-CM | POA: Diagnosis not present

## 2016-09-07 DIAGNOSIS — I129 Hypertensive chronic kidney disease with stage 1 through stage 4 chronic kidney disease, or unspecified chronic kidney disease: Secondary | ICD-10-CM | POA: Diagnosis present

## 2016-09-07 DIAGNOSIS — J209 Acute bronchitis, unspecified: Secondary | ICD-10-CM | POA: Diagnosis present

## 2016-09-07 DIAGNOSIS — Z880 Allergy status to penicillin: Secondary | ICD-10-CM

## 2016-09-07 DIAGNOSIS — Z79891 Long term (current) use of opiate analgesic: Secondary | ICD-10-CM

## 2016-09-07 DIAGNOSIS — Y95 Nosocomial condition: Secondary | ICD-10-CM | POA: Diagnosis present

## 2016-09-07 DIAGNOSIS — I480 Paroxysmal atrial fibrillation: Secondary | ICD-10-CM | POA: Diagnosis not present

## 2016-09-07 DIAGNOSIS — B3789 Other sites of candidiasis: Secondary | ICD-10-CM | POA: Diagnosis present

## 2016-09-07 DIAGNOSIS — R093 Abnormal sputum: Secondary | ICD-10-CM | POA: Diagnosis present

## 2016-09-07 DIAGNOSIS — M549 Dorsalgia, unspecified: Secondary | ICD-10-CM | POA: Diagnosis present

## 2016-09-07 DIAGNOSIS — Z881 Allergy status to other antibiotic agents status: Secondary | ICD-10-CM

## 2016-09-07 DIAGNOSIS — M1990 Unspecified osteoarthritis, unspecified site: Secondary | ICD-10-CM | POA: Diagnosis present

## 2016-09-07 DIAGNOSIS — Z66 Do not resuscitate: Secondary | ICD-10-CM | POA: Diagnosis not present

## 2016-09-07 DIAGNOSIS — T82119A Breakdown (mechanical) of unspecified cardiac electronic device, initial encounter: Secondary | ICD-10-CM | POA: Diagnosis not present

## 2016-09-07 DIAGNOSIS — Z7952 Long term (current) use of systemic steroids: Secondary | ICD-10-CM

## 2016-09-07 DIAGNOSIS — E785 Hyperlipidemia, unspecified: Secondary | ICD-10-CM | POA: Diagnosis present

## 2016-09-07 DIAGNOSIS — J40 Bronchitis, not specified as acute or chronic: Secondary | ICD-10-CM | POA: Diagnosis present

## 2016-09-07 DIAGNOSIS — B952 Enterococcus as the cause of diseases classified elsewhere: Secondary | ICD-10-CM | POA: Diagnosis present

## 2016-09-07 DIAGNOSIS — G629 Polyneuropathy, unspecified: Secondary | ICD-10-CM | POA: Diagnosis present

## 2016-09-07 DIAGNOSIS — J208 Acute bronchitis due to other specified organisms: Secondary | ICD-10-CM | POA: Diagnosis not present

## 2016-09-07 DIAGNOSIS — Y712 Prosthetic and other implants, materials and accessory cardiovascular devices associated with adverse incidents: Secondary | ICD-10-CM | POA: Diagnosis present

## 2016-09-07 DIAGNOSIS — E860 Dehydration: Secondary | ICD-10-CM | POA: Diagnosis not present

## 2016-09-07 DIAGNOSIS — I10 Essential (primary) hypertension: Secondary | ICD-10-CM | POA: Diagnosis present

## 2016-09-07 DIAGNOSIS — R0602 Shortness of breath: Secondary | ICD-10-CM | POA: Diagnosis not present

## 2016-09-07 DIAGNOSIS — I509 Heart failure, unspecified: Secondary | ICD-10-CM | POA: Diagnosis not present

## 2016-09-07 DIAGNOSIS — E038 Other specified hypothyroidism: Secondary | ICD-10-CM | POA: Diagnosis not present

## 2016-09-07 DIAGNOSIS — M8580 Other specified disorders of bone density and structure, unspecified site: Secondary | ICD-10-CM | POA: Diagnosis present

## 2016-09-07 DIAGNOSIS — G894 Chronic pain syndrome: Secondary | ICD-10-CM | POA: Diagnosis not present

## 2016-09-07 DIAGNOSIS — R5382 Chronic fatigue, unspecified: Secondary | ICD-10-CM | POA: Diagnosis present

## 2016-09-07 DIAGNOSIS — R627 Adult failure to thrive: Secondary | ICD-10-CM | POA: Diagnosis present

## 2016-09-07 DIAGNOSIS — J181 Lobar pneumonia, unspecified organism: Secondary | ICD-10-CM | POA: Diagnosis present

## 2016-09-07 DIAGNOSIS — J189 Pneumonia, unspecified organism: Secondary | ICD-10-CM | POA: Diagnosis not present

## 2016-09-07 DIAGNOSIS — R7303 Prediabetes: Secondary | ICD-10-CM | POA: Diagnosis not present

## 2016-09-07 DIAGNOSIS — G4700 Insomnia, unspecified: Secondary | ICD-10-CM | POA: Diagnosis present

## 2016-09-07 DIAGNOSIS — R05 Cough: Secondary | ICD-10-CM | POA: Diagnosis not present

## 2016-09-07 DIAGNOSIS — E039 Hypothyroidism, unspecified: Secondary | ICD-10-CM | POA: Diagnosis not present

## 2016-09-07 DIAGNOSIS — Z882 Allergy status to sulfonamides status: Secondary | ICD-10-CM

## 2016-09-07 DIAGNOSIS — F329 Major depressive disorder, single episode, unspecified: Secondary | ICD-10-CM | POA: Diagnosis present

## 2016-09-07 DIAGNOSIS — N183 Chronic kidney disease, stage 3 unspecified: Secondary | ICD-10-CM | POA: Diagnosis present

## 2016-09-07 HISTORY — DX: Paroxysmal atrial fibrillation: I48.0

## 2016-09-07 HISTORY — DX: Essential (primary) hypertension: I10

## 2016-09-07 HISTORY — DX: Sick sinus syndrome: I49.5

## 2016-09-07 HISTORY — DX: Polyneuropathy, unspecified: G62.9

## 2016-09-07 LAB — CBC WITH DIFFERENTIAL/PLATELET
Basophils Absolute: 0 10*3/uL (ref 0.0–0.1)
Basophils Relative: 0 %
EOS ABS: 0.1 10*3/uL (ref 0.0–0.7)
EOS PCT: 1 %
HCT: 41.3 % (ref 36.0–46.0)
Hemoglobin: 13.3 g/dL (ref 12.0–15.0)
LYMPHS ABS: 1.6 10*3/uL (ref 0.7–4.0)
Lymphocytes Relative: 17 %
MCH: 30.3 pg (ref 26.0–34.0)
MCHC: 32.2 g/dL (ref 30.0–36.0)
MCV: 94.1 fL (ref 78.0–100.0)
MONOS PCT: 13 %
Monocytes Absolute: 1.2 10*3/uL — ABNORMAL HIGH (ref 0.1–1.0)
Neutro Abs: 6.4 10*3/uL (ref 1.7–7.7)
Neutrophils Relative %: 69 %
PLATELETS: 173 10*3/uL (ref 150–400)
RBC: 4.39 MIL/uL (ref 3.87–5.11)
RDW: 14 % (ref 11.5–15.5)
WBC: 9.4 10*3/uL (ref 4.0–10.5)

## 2016-09-07 LAB — BASIC METABOLIC PANEL
Anion gap: 8 (ref 5–15)
BUN: 22 mg/dL — AB (ref 6–20)
CHLORIDE: 105 mmol/L (ref 101–111)
CO2: 26 mmol/L (ref 22–32)
CREATININE: 1.49 mg/dL — AB (ref 0.44–1.00)
Calcium: 8.8 mg/dL — ABNORMAL LOW (ref 8.9–10.3)
GFR calc Af Amer: 37 mL/min — ABNORMAL LOW (ref >60)
GFR, EST NON AFRICAN AMERICAN: 32 mL/min — AB (ref >60)
GLUCOSE: 106 mg/dL — AB (ref 65–99)
Potassium: 4 mmol/L (ref 3.5–5.1)
SODIUM: 139 mmol/L (ref 135–145)

## 2016-09-07 LAB — TSH: TSH: 1.3 u[IU]/mL (ref 0.350–4.500)

## 2016-09-07 LAB — LACTIC ACID, PLASMA: LACTIC ACID, VENOUS: 2.2 mmol/L — AB (ref 0.5–1.9)

## 2016-09-07 LAB — TROPONIN I: Troponin I: <0.03 ng/mL (ref <0.03)

## 2016-09-07 LAB — BRAIN NATRIURETIC PEPTIDE: B NATRIURETIC PEPTIDE 5: 232 pg/mL — AB (ref 0.0–100.0)

## 2016-09-07 MED ORDER — IPRATROPIUM-ALBUTEROL 0.5-2.5 (3) MG/3ML IN SOLN
RESPIRATORY_TRACT | Status: AC
  Filled 2016-09-07: qty 3

## 2016-09-07 MED ORDER — DEXTROSE 5 % IV SOLN
INTRAVENOUS | Status: AC
  Filled 2016-09-07 (×2): qty 2

## 2016-09-07 MED ORDER — DABIGATRAN ETEXILATE MESYLATE 150 MG PO CAPS
ORAL_CAPSULE | ORAL | Status: AC
  Filled 2016-09-07: qty 1

## 2016-09-07 MED ORDER — VANCOMYCIN HCL 10 G IV SOLR
1250.0000 mg | INTRAVENOUS | Status: DC
  Administered 2016-09-08: 1250 mg via INTRAVENOUS
  Filled 2016-09-07 (×2): qty 1250

## 2016-09-07 MED ORDER — DULOXETINE HCL 60 MG PO CPEP
60.0000 mg | ORAL_CAPSULE | Freq: Two times a day (BID) | ORAL | Status: DC
  Administered 2016-09-07 – 2016-09-10 (×6): 60 mg via ORAL
  Filled 2016-09-07 (×6): qty 1

## 2016-09-07 MED ORDER — METOPROLOL SUCCINATE ER 50 MG PO TB24
50.0000 mg | ORAL_TABLET | Freq: Once | ORAL | Status: AC
  Administered 2016-09-07: 50 mg via ORAL
  Filled 2016-09-07: qty 1

## 2016-09-07 MED ORDER — AZTREONAM 2 G IJ SOLR
2.0000 g | Freq: Three times a day (TID) | INTRAMUSCULAR | Status: DC
  Administered 2016-09-07 – 2016-09-09 (×5): 2 g via INTRAVENOUS
  Filled 2016-09-07 (×9): qty 2

## 2016-09-07 MED ORDER — HYDROCODONE-ACETAMINOPHEN 5-325 MG PO TABS
1.0000 | ORAL_TABLET | ORAL | Status: DC | PRN
  Administered 2016-09-07 – 2016-09-11 (×9): 1 via ORAL
  Filled 2016-09-07 (×9): qty 1

## 2016-09-07 MED ORDER — PANTOPRAZOLE SODIUM 40 MG PO TBEC
40.0000 mg | DELAYED_RELEASE_TABLET | Freq: Every day | ORAL | Status: DC
  Administered 2016-09-07 – 2016-09-11 (×5): 40 mg via ORAL
  Filled 2016-09-07 (×5): qty 1

## 2016-09-07 MED ORDER — SODIUM CHLORIDE 0.9 % IV BOLUS (SEPSIS)
500.0000 mL | Freq: Once | INTRAVENOUS | Status: AC
  Administered 2016-09-08: 500 mL via INTRAVENOUS

## 2016-09-07 MED ORDER — DABIGATRAN ETEXILATE MESYLATE 150 MG PO CAPS
150.0000 mg | ORAL_CAPSULE | Freq: Two times a day (BID) | ORAL | Status: DC
  Administered 2016-09-07 – 2016-09-09 (×4): 150 mg via ORAL
  Filled 2016-09-07 (×8): qty 1

## 2016-09-07 MED ORDER — ALPRAZOLAM 1 MG PO TABS
1.0000 mg | ORAL_TABLET | Freq: Every evening | ORAL | Status: DC | PRN
  Administered 2016-09-07 – 2016-09-10 (×4): 1 mg via ORAL
  Filled 2016-09-07 (×4): qty 1

## 2016-09-07 MED ORDER — LEVOTHYROXINE SODIUM 50 MCG PO TABS
50.0000 ug | ORAL_TABLET | Freq: Every day | ORAL | Status: DC
  Administered 2016-09-08 – 2016-09-11 (×4): 50 ug via ORAL
  Filled 2016-09-07 (×4): qty 1

## 2016-09-07 MED ORDER — LACTATED RINGERS IV SOLN
INTRAVENOUS | Status: DC
  Administered 2016-09-07 – 2016-09-09 (×4): via INTRAVENOUS

## 2016-09-07 MED ORDER — IPRATROPIUM-ALBUTEROL 0.5-2.5 (3) MG/3ML IN SOLN
3.0000 mL | Freq: Once | RESPIRATORY_TRACT | Status: AC
  Administered 2016-09-07: 3 mL via RESPIRATORY_TRACT

## 2016-09-07 MED ORDER — LISINOPRIL 10 MG PO TABS
40.0000 mg | ORAL_TABLET | Freq: Every day | ORAL | Status: DC
  Administered 2016-09-07 – 2016-09-11 (×5): 40 mg via ORAL
  Filled 2016-09-07 (×5): qty 4

## 2016-09-07 MED ORDER — BUSPIRONE HCL 5 MG PO TABS
10.0000 mg | ORAL_TABLET | Freq: Three times a day (TID) | ORAL | Status: DC
  Administered 2016-09-07 – 2016-09-10 (×9): 10 mg via ORAL
  Filled 2016-09-07 (×9): qty 2

## 2016-09-07 MED ORDER — FLECAINIDE ACETATE 50 MG PO TABS
50.0000 mg | ORAL_TABLET | Freq: Two times a day (BID) | ORAL | Status: DC
  Administered 2016-09-07 – 2016-09-11 (×8): 50 mg via ORAL
  Filled 2016-09-07 (×14): qty 1

## 2016-09-07 MED ORDER — LABETALOL HCL 5 MG/ML IV SOLN
5.0000 mg | INTRAVENOUS | Status: DC | PRN
  Administered 2016-09-07: 5 mg via INTRAVENOUS
  Filled 2016-09-07: qty 4

## 2016-09-07 MED ORDER — GABAPENTIN 300 MG PO CAPS
300.0000 mg | ORAL_CAPSULE | Freq: Three times a day (TID) | ORAL | Status: DC
  Administered 2016-09-07 – 2016-09-11 (×11): 300 mg via ORAL
  Filled 2016-09-07 (×12): qty 1

## 2016-09-07 MED ORDER — FLECAINIDE ACETATE 50 MG PO TABS
50.0000 mg | ORAL_TABLET | Freq: Once | ORAL | Status: AC
  Administered 2016-09-07: 50 mg via ORAL
  Filled 2016-09-07: qty 1

## 2016-09-07 MED ORDER — ALBUTEROL SULFATE (2.5 MG/3ML) 0.083% IN NEBU
2.5000 mg | INHALATION_SOLUTION | Freq: Once | RESPIRATORY_TRACT | Status: AC
  Administered 2016-09-07: 2.5 mg via RESPIRATORY_TRACT

## 2016-09-07 MED ORDER — METOPROLOL SUCCINATE ER 50 MG PO TB24
50.0000 mg | ORAL_TABLET | Freq: Every day | ORAL | Status: DC
  Administered 2016-09-07 – 2016-09-11 (×5): 50 mg via ORAL
  Filled 2016-09-07 (×5): qty 1

## 2016-09-07 MED ORDER — LEVOFLOXACIN IN D5W 500 MG/100ML IV SOLN
500.0000 mg | Freq: Once | INTRAVENOUS | Status: AC
  Administered 2016-09-07: 500 mg via INTRAVENOUS
  Filled 2016-09-07: qty 100

## 2016-09-07 MED ORDER — VANCOMYCIN HCL 10 G IV SOLR
1500.0000 mg | Freq: Once | INTRAVENOUS | Status: AC
  Administered 2016-09-07: 1500 mg via INTRAVENOUS
  Filled 2016-09-07: qty 1500

## 2016-09-07 MED ORDER — VANCOMYCIN HCL 10 G IV SOLR
INTRAVENOUS | Status: AC
  Filled 2016-09-07: qty 1500

## 2016-09-07 MED ORDER — PRAVASTATIN SODIUM 40 MG PO TABS
80.0000 mg | ORAL_TABLET | Freq: Every day | ORAL | Status: DC
  Administered 2016-09-07 – 2016-09-10 (×4): 80 mg via ORAL
  Filled 2016-09-07 (×4): qty 2

## 2016-09-07 MED ORDER — ALBUTEROL SULFATE (2.5 MG/3ML) 0.083% IN NEBU
INHALATION_SOLUTION | RESPIRATORY_TRACT | Status: AC
  Filled 2016-09-07: qty 3

## 2016-09-07 MED ORDER — FLECAINIDE ACETATE 100 MG PO TABS
ORAL_TABLET | ORAL | Status: AC
  Filled 2016-09-07: qty 1

## 2016-09-07 MED ORDER — AMLODIPINE BESYLATE 5 MG PO TABS
10.0000 mg | ORAL_TABLET | Freq: Every day | ORAL | Status: DC
Start: 2016-09-07 — End: 2016-09-11
  Administered 2016-09-07 – 2016-09-11 (×5): 10 mg via ORAL
  Filled 2016-09-07 (×5): qty 2

## 2016-09-07 MED ORDER — ALBUTEROL SULFATE (2.5 MG/3ML) 0.083% IN NEBU
2.5000 mg | INHALATION_SOLUTION | RESPIRATORY_TRACT | Status: DC | PRN
  Filled 2016-09-07: qty 3

## 2016-09-07 NOTE — ED Notes (Signed)
Attempted report. Receiving RN unavailable at this time.

## 2016-09-07 NOTE — Progress Notes (Signed)
Pharmacy Antibiotic Note  Kelsey Sandoval is a 82 y.o. female admitted on 09/07/2016 with pneumonia.  Pharmacy has been consulted for Vancomycin dosing.  Plan: Vancomycin 1500mg  IV x 1 Vancomycin 1250 mg IV every 24 hours.  Goal trough 15-20 mcg/mL. Monitor labs, micro and vitals.   Height: 5\' 9"  (175.3 cm) Weight: 180 lb (81.6 kg) IBW/kg (Calculated) : 66.2  Temp (24hrs), Avg:98.2 F (36.8 C), Min:98 F (36.7 C), Max:98.4 F (36.9 C)   Recent Labs Lab 09/07/16 1022  WBC 9.4  CREATININE 1.49*    Estimated Creatinine Clearance: 33.8 mL/min (by C-G formula based on SCr of 1.49 mg/dL (H)).    Allergies  Allergen Reactions  . Penicillins Other (See Comments)    Caused patient to pass out.Can take cephalosporins Has patient had a PCN reaction causing immediate rash, facial/tongue/throat swelling, SOB or lightheadedness with hypotension: no Has patient had a PCN reaction causing severe rash involving mucus membranes or skin necrosis: No Has patient had a PCN reaction that required hospitalization No Has patient had a PCN reaction occurring within the last 10 years: No If all of the above answers are "NO", then may proceed with Cephalosporin use.   . Levaquin [Levofloxacin] Nausea Only  . Sulfa Antibiotics Other (See Comments)    Unknown  . Zithromax [Azithromycin] Nausea Only and Rash    Antimicrobials this admission: Vancomycin 12/4 >>  Aztreonam 12/4 >>    Dose adjustments this admission: n/a  Microbiology results:   Thank you for allowing pharmacy to be a part of this patient's care.  Pricilla Larsson 09/07/2016 6:17 PM

## 2016-09-07 NOTE — ED Notes (Signed)
Attempted IV access in left wrist and right wrist with no success. Pt tolerated well. Mild bruising and contusion noted to sites.   Other ED staff attempting to obtain IV access and blood work at this time.

## 2016-09-07 NOTE — ED Triage Notes (Signed)
Pt reports cough and congestion for past 3 weeks and has been on antibiotics twice since her symptoms. Started.  Reports she finished her last dose of antibiotics last Monday and started getting SOB again on Saturday.  C/O headache and chest pain with coughing.

## 2016-09-07 NOTE — Telephone Encounter (Signed)
Patient called stating that she was having trouble breathing and wheezing. Spoke with Dr. Nicki Reaper and with the next available appointment being late this evening it would be best for patient to be evaluated at the ER. Patient agreed and verbalized understanding.

## 2016-09-07 NOTE — H&P (Signed)
History and Physical    Kelsey Sandoval AYT:[id number] DOB: [birth year] DOA: 09/07/2016  PCP: Sallee Lange, MD Consultants:  [place]; Bronson Ing - cardiology; Pain Management; Harrington Challenger - psychiatrist; nephrology in [place] (3 infusions, last one in summer 2017) Patient coming from: home - lives alone; [place]: son - [phone]  Chief Complaint: cough  HPI: Kelsey Sandoval is a 82 y.o. female with medical history significant of membranous nephropathy, HTN, HLD, afib on Pradaxa, and chronic pain presenting with SOB, cough, wheezing.  She reports that "this go around" the cough has been nonproductive.  Ongoing symptoms for about a month now.  3 doctor visits including once to the ER.  Intermittent spells of feeling hot/sweaty.  Feels tired all the time.   Symptoms have gotten better and then worse again, but fatigue never changed.  +weight gain - thinks related to prednisone (craves [employer] and meat skins).  Initially seen in ER on 11/2 and given Omnicef and PO prednisone. Seen by Dr. Wolfgang Phoenix for this issue on 11/21 - given doxycycline.  ED Course: Per Dr. Roderic Palau: Patient with community-acquired pneumonia. She'll be admitted for antibiotics  Review of Systems: As per HPI; otherwise 10 point review of systems reviewed and negative.   Ambulatory Status:  Walks with a cane  Past Medical History:  Diagnosis Date  . Anxiety   . Arthritis   . Atrial fibrillation ([place])   . Back pain, chronic    sciatica  . Cataracts, bilateral   . Depression   . Dysrhythmia   . Hyperlipidemia   . Hypertension   . Membranous nephropathy determined by biopsy 06/07/2015  . Nephrotic syndrome    RHUX  . Neuropathy ([place])   . Osteopenia   . Pacemaker   . Pre-diabetes   . Urine protein increased     Past Surgical History:  Procedure Laterality Date  . ABDOMINAL HYSTERECTOMY     partial-pt has no ovaries  . APPENDECTOMY    . BACK SURGERY    . BIOPSY N/A 01/17/2014   Procedure: BIOPSY;  Surgeon:  Rogene Houston, MD;  Location: [facility];  Service: Endoscopy;  Laterality: N/A;  . CATARACT EXTRACTION W/PHACO Left 02/28/2015   Procedure: CATARACT EXTRACTION PHACO AND INTRAOCULAR LENS PLACEMENT (IOC);  Surgeon: [name], MD;  Location: [facility];  Service: Ophthalmology;  Laterality: Left;  CDE 18.71  . COLONOSCOPY  9/05  . COLONOSCOPY N/A 10/11/2013   Procedure: COLONOSCOPY;  Surgeon: Rogene Houston, MD;  Location: [facility];  Service: Endoscopy;  Laterality: N/A;  1200  . dual chamber pacemaker     2012  . ESOPHAGOGASTRODUODENOSCOPY N/A 01/17/2014   Procedure: ESOPHAGOGASTRODUODENOSCOPY (EGD);  Surgeon: Rogene Houston, MD;  Location: [facility];  Service: Endoscopy;  Laterality: N/A;  230  . ESOPHAGOGASTRODUODENOSCOPY N/A 01/02/2016   Procedure: ESOPHAGOGASTRODUODENOSCOPY (EGD);  Surgeon: Rogene Houston, MD;  Location: [facility];  Service: Endoscopy;  Laterality: N/A;  240  . INSERT / REPLACE / REMOVE PACEMAKER    . wisdom tooth extracton      Social History   Social History  . Marital status: Divorced    Spouse name: N/A  . Number of children: N/A  . Years of education: N/A   Occupational History  . retired    Social History Main Topics  . Smoking status: Never Smoker  . Smokeless tobacco: Never Used  . Alcohol use No  . Drug use: No  . Sexual activity: Not on  file   Other Topics Concern  . Not on file   Social History Narrative  . No narrative on file    Allergies  Allergen Reactions  . Penicillins Other (See Comments)    Caused patient to pass out.Can take cephalosporins Has patient had a PCN reaction causing immediate rash, facial/tongue/throat swelling, SOB or lightheadedness with hypotension: no Has patient had a PCN reaction causing severe rash involving mucus membranes or skin necrosis: No Has patient had a PCN reaction that required hospitalization No Has patient had a PCN reaction occurring within the last 10 years: No If all of the above  answers are "NO", then may proceed with Cephalosporin use.   . Levaquin [Levofloxacin] Nausea Only  . Sulfa Antibiotics Other (See Comments)    Unknown  . Zithromax [Azithromycin] Nausea Only and Rash    Family History  Problem Relation Age of Onset  . Colon cancer Brother   . Anxiety disorder Sister   . Depression Sister   . Anxiety disorder Sister   . Depression Sister     Prior to Admission medications   Medication Sig Start Date End Date Taking? Authorizing Provider  acetaminophen (TYLENOL) 650 MG CR tablet Take 1,300 mg by mouth every 8 (eight) hours as needed for pain.   Yes Historical Provider, MD  albuterol (PROVENTIL HFA;VENTOLIN HFA) 108 (90 BASE) MCG/ACT inhaler Inhale 2 puffs into the lungs every 6 (six) hours as needed for wheezing or shortness of breath. 09/17/14  Yes Kathyrn Drown, MD  ALPRAZolam Duanne Moron) 1 MG tablet 1 qhs prn insomnia 08/03/16  Yes Kathyrn Drown, MD  amLODipine (NORVASC) 10 MG tablet Take 1 tablet (10 mg total) by mouth daily. 01/28/16  Yes Kathyrn Drown, MD  busPIRone (BUSPAR) 10 MG tablet Take 1 tablet (10 mg total) by mouth 3 (three) times daily. 07/23/16  Yes Cloria Spring, MD  dabigatran (PRADAXA) 150 MG CAPS capsule Take 1 capsule (150 mg total) by mouth 2 (two) times daily. 07/03/16  Yes Herminio Commons, MD  DULoxetine (CYMBALTA) 60 MG capsule Take 1 capsule (60 mg total) by mouth 2 (two) times daily. 07/23/16 07/23/17 Yes Cloria Spring, MD  flecainide (TAMBOCOR) 50 MG tablet Take 1 tablet (50 mg total) by mouth 2 (two) times daily. 07/03/16  Yes Herminio Commons, MD  gabapentin (NEURONTIN) 300 MG capsule TAKE ONE CAPSULE BY MOUTH THREE TIMES DAILY **NEW DOSING** 07/29/16  Yes Kathyrn Drown, MD  HYDROcodone-acetaminophen (NORCO/VICODIN) 5-325 MG tablet Take 1 tablet by mouth every 4 (four) hours as needed. 08/03/16  Yes Kathyrn Drown, MD  levothyroxine (SYNTHROID, LEVOTHROID) 50 MCG tablet TAKE ONE TABLET BY MOUTH ONCE DAILY **CHANGE  IN   DOSE** 01/28/16  Yes Kathyrn Drown, MD  lisinopril (PRINIVIL,ZESTRIL) 40 MG tablet Take 1 tablet (40 mg total) by mouth daily. 09/02/15  Yes Kathyrn Drown, MD  metoprolol succinate (TOPROL-XL) 50 MG 24 hr tablet TAKE ONE TABLET BY MOUTH ONCE DAILY WITH  OR  IMMEDIATELY  FOLLOWING  A  MEAL 09/02/15  Yes Kathyrn Drown, MD  ondansetron (ZOFRAN-ODT) 8 MG disintegrating tablet Take 1 tablet (8 mg total) by mouth every 8 (eight) hours as needed for nausea or vomiting. 12/24/15  Yes Kathyrn Drown, MD  pantoprazole (PROTONIX) 40 MG tablet Take 1 tablet (40 mg total) by mouth daily. Reported on 01/28/2016 01/28/16  Yes Kathyrn Drown, MD  pravastatin (PRAVACHOL) 80 MG tablet Take 1 tablet (80 mg total) by  mouth daily. 09/02/15  Yes Kathyrn Drown, MD  vitamin B-12 (CYANOCOBALAMIN) 1000 MCG tablet Take 1 tablet (1,000 mcg total) by mouth daily. 03/11/15  Yes Kathie Dike, MD  cefdinir (OMNICEF) 300 MG capsule Take 1 capsule (300 mg total) by mouth 2 (two) times daily. Patient not taking: Reported on 09/07/2016 08/06/16   Isla Pence, MD  doxycycline (VIBRAMYCIN) 100 MG capsule Take 1 capsule (100 mg total) by mouth 2 (two) times daily. Patient not taking: Reported on 09/07/2016 08/25/16   Kathyrn Drown, MD  predniSONE (DELTASONE) 20 MG tablet 3qd for 2d then 2qd for 2d then 1qd for 2d Patient not taking: Reported on 09/07/2016 08/25/16   Kathyrn Drown, MD  riTUXimab in sodium chloride 0.9 % 250 mL Infusion 2 weeks apart: May repeat in 6 months. Follow up with primary care doctor prior to initiation of rituximab for infectious clearance 03/11/15   Kathie Dike, MD    Physical Exam: Vitals:   09/07/16 1628 09/07/16 1630 09/07/16 1654 09/07/16 1700  BP: (!) 143/101 155/93  111/100  Pulse: 93 99 97 85  Resp: 24 12    Temp:      TempSrc:      SpO2: 96% 95% 97% 98%  Weight:      Height:         General:  Appears fatigued but calm and comfortable and is NAD Eyes:  PERRL, EOMI, normal lids, iris ENT:   grossly normal hearing, lips & tongue, mmm Neck:  no LAD, masses or thyromegaly Cardiovascular:  Irregularly irregular rate/rhythm, no m/r/g. No LE edema.  Respiratory:  End expiratory wheezing with moderate air movement bilaterally. Normal respiratory effort. Abdomen:  soft, ntnd, NABS Skin:  no rash or induration seen on limited exam Musculoskeletal:  grossly normal tone BUE/BLE, good ROM, no bony abnormality Psychiatric:  grossly normal mood and affect, speech fluent and appropriate, AOx3 Neurologic:  CN 2-12 grossly intact, moves all extremities in coordinated fashion, sensation intact  Labs on Admission: I have personally reviewed following labs and imaging studies  CBC:  Recent Labs Lab 09/07/16 1022  WBC 9.4  NEUTROABS 6.4  HGB 13.3  HCT 41.3  MCV 94.1  PLT 782   Basic Metabolic Panel:  Recent Labs Lab 09/07/16 1022  NA 139  K 4.0  CL 105  CO2 26  GLUCOSE 106*  BUN 22*  CREATININE 1.49*  CALCIUM 8.8*   GFR: Estimated Creatinine Clearance: 33.8 mL/min (by C-G formula based on SCr of 1.49 mg/dL (H)). Liver Function Tests: No results for input(s): AST, ALT, ALKPHOS, BILITOT, PROT, ALBUMIN in the last 168 hours. No results for input(s): LIPASE, AMYLASE in the last 168 hours. No results for input(s): AMMONIA in the last 168 hours. Coagulation Profile: No results for input(s): INR, PROTIME in the last 168 hours. Cardiac Enzymes:  Recent Labs Lab 09/07/16 1026  TROPONINI <0.03   BNP (last 3 results) No results for input(s): PROBNP in the last 8760 hours. HbA1C: No results for input(s): HGBA1C in the last 72 hours. CBG: No results for input(s): GLUCAP in the last 168 hours. Lipid Profile: No results for input(s): CHOL, HDL, LDLCALC, TRIG, CHOLHDL, LDLDIRECT in the last 72 hours. Thyroid Function Tests: No results for input(s): TSH, T4TOTAL, FREET4, T3FREE, THYROIDAB in the last 72 hours. Anemia Panel: No results for input(s): VITAMINB12, FOLATE,  FERRITIN, TIBC, IRON, RETICCTPCT in the last 72 hours. Urine analysis:    Component Value Date/Time   COLORURINE YELLOW 08/06/2016 1626  APPEARANCEUR CLEAR 08/06/2016 1626   LABSPEC 1.015 08/06/2016 1626   PHURINE 6.0 08/06/2016 1626   GLUCOSEU NEGATIVE 08/06/2016 1626   HGBUR TRACE (A) 08/06/2016 1626   BILIRUBINUR NEGATIVE 08/06/2016 1626   BILIRUBINUR 3+ 01/14/2016 1036   KETONESUR NEGATIVE 08/06/2016 1626   PROTEINUR >300 (A) 08/06/2016 1626   UROBILINOGEN negative 01/14/2016 1036   UROBILINOGEN 0.2 03/07/2015 2005   NITRITE NEGATIVE 08/06/2016 1626   LEUKOCYTESUR NEGATIVE 08/06/2016 1626    Creatinine Clearance: Estimated Creatinine Clearance: 33.8 mL/min (by C-G formula based on SCr of 1.49 mg/dL (H)).  Sepsis Labs: @LABRCNTIP (procalcitonin:4,lacticidven:4) )No results found for this or any previous visit (from the past 240 hour(s)).   Radiological Exams on Admission: Dg Chest 2 View  Result Date: 09/07/2016 CLINICAL DATA:  Lung infection.  Cough. EXAM: CHEST  2 VIEW COMPARISON:  08/06/2016. FINDINGS: Cardiac pacer with lead tips in the right atrium and right ventricle. Cardiomegaly with normal pulmonary vascularity. New infiltrate in the left upper lobe consistent pneumonia. No pleural effusion pneumothorax. Diffuse thoracic spine osteopenia degenerative change. Mild mid thoracic spine stable compression fracture. IMPRESSION: New infiltrate in the left upper lobe consistent with pneumonia. Follow-up chest x-rays to demonstrate clearing suggested. Electronically Signed   By: Marcello Moores  Register   On: 09/07/2016 10:55    EKG: Independently reviewed.  Afib with rate 120; nonspecific ST changes with no evidence of acute ischemia  Assessment/Plan Principal Problem:   Pneumonia Active Problems:   Paroxysmal atrial fibrillation (HCC)   Systemic hypertension   Hypothyroidism   Chronic pain syndrome   Chronic anticoagulation   Chronic kidney disease (CKD), stage III  (moderate)   Pneumonia -Given recurrent +/- productive cough, mildly decreased oxygen saturation, and infiltrate in left upper lobe on chest x-ray , most likely community-acquired pneumonia.  Other etiologies include aspiration versus URI versus HCAP. -While patient does not have clear criteria for HCAP, she has now failed outpatient therapy with both Omnicef and doxycycline; and has allergies to the standard CAP medications (Azithro, PCN, and Levaquin).  As such, will treat as HCAP with Aztreonam and Vanc for now.   -Given her recurrent symptoms despite 2 full courses of PO antibiotics, would suggest a longer (14 day?) treatment course this time. - Repeat CBC in am - Sputum cultures - Blood cultures -Strep pneumo urinary antigen pending - albuterol PRN -Does not appear to have sepsis but does have elevated WBC count and tachycardia associated with afib; normal BPs; lactate not done, will order  PAF on anticoagulation -Patient with long-standing afib -Per Dr. Court Joy last office note, it appears that the patient is not supposed to be continuing to take Flecainide since it was ineffective; however, she has been taking it and so it will be continued for now -Also takes Toprol XL for rate control -Ongoing mild tachycardia, will treat with prn IV labetolol -If poor HR control, may need to consider transition to Diltiazem gtt -CHA2DS2-VASc score of 4, on Pradaxa  HTN -Continue Norvasc, Lisinopril, Toprol-XL  Hypothyroidism -Normal TSH today -Continue Synthroid at current dose  Chronic pain syndrome -I have reviewed this patient in the [place] Controlled Substances Reporting System.  She is receiving medications from only one provider and appears to be taking them as prescribed. -Vicodin continued (and Xanax for insomnia)  CKD -Appears to be stable  -Will follow  DVT prophylaxis: Pradaxa Code Status: DNR - confirmed with patient/family Family Communication: Family member present  throughout evaluation Disposition Plan:  Home once clinically improved Consults called: None  Admission status: Admit - It is my clinical opinion that admission to INPATIENT is reasonable and necessary because this patient will require at least 2 midnights in the hospital to treat this condition based on the medical complexity of the problems presented.  Given the aforementioned information, the predictability of an adverse outcome is felt to be significant.     Karmen Bongo MD [facility]  If 7PM-7AM, please contact night-coverage [email or web address] Password [email or web address]  09/07/2016, 9:05 PM

## 2016-09-07 NOTE — ED Notes (Signed)
Hospitalist at bedside 

## 2016-09-07 NOTE — ED Provider Notes (Signed)
[facility] DEPT Provider Note   CSN: [id number] Arrival date & time: 09/07/16  1000  By signing my name below, I, Kelsey Sandoval, attest that this documentation has been prepared under the direction and in the presence of Milton Ferguson, MD . Electronically Signed: Higinio Sandoval, Scribe. 09/07/2016. 10:30 AM.  History   Chief Complaint Chief Complaint  Patient presents with  . Shortness of Breath   Patient complains of cough wheezing shortness of breath.  Patient has had 2 treatments in the last month for respiratory infection.  She was on Omnicef and doxycycline   The history is provided by the patient. No language interpreter was used.  Shortness of Breath  This is a new problem. The problem occurs continuously.The current episode started more than 1 week ago. The problem has not changed since onset.Associated symptoms include cough and wheezing. Pertinent negatives include no headaches, no chest pain, no vomiting, no abdominal pain and no rash.   HPI Comments: Kelsey Sandoval is a 82 y.o. female who presents to the Emergency Department complaining of gradually worsening, cough and shortness of breath that began 1 month ago.  Pt reports associated wheezing, fatigue and episodes of intermittent diaphoresis over the past week. She notes she visited her PCP on 11/21 and was diagnosed with bronchitis and prescribed an antibiotic that she finished last week. She states she has a pacemaker in place and has not taken any of her medications today, including her flecainide. Pt denies vomiting.  Past Medical History:  Diagnosis Date  . Anxiety   . Arthritis   . Atrial fibrillation ([place])   . Back pain, chronic   . Cataracts, bilateral   . Depression   . Dysrhythmia   . Hyperlipidemia   . Hypertension   . Membranous nephropathy determined by biopsy 06/07/2015  . Nephrotic syndrome    RHUX  . Osteopenia   . Pacemaker   . Pre-diabetes   . Urine protein increased     Patient Active Problem  List   Diagnosis Date Noted  . Hereditary and idiopathic peripheral neuropathy 03/05/2016  . Major depression 12/04/2015  . Major depression in remission ([place]) 09/02/2015  . Membranous nephropathy determined by biopsy 06/07/2015  . Osteoarthritis of both knees 03/26/2015  . Chronic pain syndrome 03/26/2015  . Intractable nausea and vomiting   . Nausea with vomiting   . Malnutrition of moderate degree ([place]) 03/08/2015  . CAP (community acquired pneumonia) 03/08/2015  . UTI (lower urinary tract infection) 03/07/2015  . Nausea & vomiting 03/07/2015  . Hypothyroidism 02/14/2015  . Disorder of kidney 01/17/2015  . Nephrotic syndrome 06/21/2014  . Proteinuria 03/29/2014  . Osteopenia 03/29/2014  . Anemia, iron deficiency 08/22/2013  . Mitral insufficiency 06/11/2013  . Pacemaker 06/11/2013  . Paroxysmal atrial fibrillation ([place]) 09/26/2012  . Tachycardia-bradycardia syndrome ([place]) 09/26/2012  . Sick sinus syndrome ([place]) 09/26/2012  . Systemic hypertension 09/26/2012  . Dyslipidemia 09/26/2012    Past Surgical History:  Procedure Laterality Date  . ABDOMINAL HYSTERECTOMY     partial-pt has no ovaries  . APPENDECTOMY    . BACK SURGERY    . BIOPSY N/A 01/17/2014   Procedure: BIOPSY;  Surgeon: Rogene Houston, MD;  Location: [facility];  Service: Endoscopy;  Laterality: N/A;  . CATARACT EXTRACTION W/PHACO Left 02/28/2015   Procedure: CATARACT EXTRACTION PHACO AND INTRAOCULAR LENS PLACEMENT (IOC);  Surgeon: [name], MD;  Location: [facility] ORS;  Service: Ophthalmology;  Laterality: Left;  CDE 18.71  . COLONOSCOPY  9/05  .  COLONOSCOPY N/A 10/11/2013   Procedure: COLONOSCOPY;  Surgeon: Rogene Houston, MD;  Location: [facility];  Service: Endoscopy;  Laterality: N/A;  1200  . dual chamber pacemaker     2012  . ESOPHAGOGASTRODUODENOSCOPY N/A 01/17/2014   Procedure: ESOPHAGOGASTRODUODENOSCOPY (EGD);  Surgeon: Rogene Houston, MD;  Location: [facility];  Service: Endoscopy;  Laterality:  N/A;  230  . ESOPHAGOGASTRODUODENOSCOPY N/A 01/02/2016   Procedure: ESOPHAGOGASTRODUODENOSCOPY (EGD);  Surgeon: Rogene Houston, MD;  Location: [facility];  Service: Endoscopy;  Laterality: N/A;  240  . INSERT / REPLACE / REMOVE PACEMAKER    . wisdom tooth extracton      OB History    Gravida Para Term Preterm AB Living   2 2 2          SAB TAB Ectopic Multiple Live Births                 Home Medications    Prior to Admission medications   Medication Sig Start Date End Date Taking? Authorizing Provider  albuterol (PROVENTIL HFA;VENTOLIN HFA) 108 (90 BASE) MCG/ACT inhaler Inhale 2 puffs into the lungs every 6 (six) hours as needed for wheezing or shortness of breath. 09/17/14   Kathyrn Drown, MD  ALPRAZolam Duanne Moron) 1 MG tablet 1 qhs prn insomnia 08/03/16   Kathyrn Drown, MD  amLODipine (NORVASC) 10 MG tablet Take 1 tablet (10 mg total) by mouth daily. 01/28/16   Kathyrn Drown, MD  busPIRone (BUSPAR) 10 MG tablet Take 1 tablet (10 mg total) by mouth 3 (three) times daily. 07/23/16   Cloria Spring, MD  cefdinir (OMNICEF) 300 MG capsule Take 1 capsule (300 mg total) by mouth 2 (two) times daily. 08/06/16   Isla Pence, MD  dabigatran (PRADAXA) 150 MG CAPS capsule Take 1 capsule (150 mg total) by mouth 2 (two) times daily. 07/03/16   Herminio Commons, MD  doxycycline (VIBRAMYCIN) 100 MG capsule Take 1 capsule (100 mg total) by mouth 2 (two) times daily. 08/25/16   Kathyrn Drown, MD  DULoxetine (CYMBALTA) 60 MG capsule Take 1 capsule (60 mg total) by mouth 2 (two) times daily. 07/23/16 07/23/17  Cloria Spring, MD  flecainide (TAMBOCOR) 50 MG tablet Take 1 tablet (50 mg total) by mouth 2 (two) times daily. 07/03/16   Herminio Commons, MD  gabapentin (NEURONTIN) 300 MG capsule TAKE ONE CAPSULE BY MOUTH THREE TIMES DAILY **NEW DOSING** 07/29/16   Kathyrn Drown, MD  HYDROcodone-acetaminophen (NORCO/VICODIN) 5-325 MG tablet Take 1 tablet by mouth every 4 (four) hours as needed.  08/03/16   Kathyrn Drown, MD  levothyroxine (SYNTHROID, LEVOTHROID) 50 MCG tablet TAKE ONE TABLET BY MOUTH ONCE DAILY **CHANGE  IN  DOSE** 01/28/16   Kathyrn Drown, MD  lisinopril (PRINIVIL,ZESTRIL) 40 MG tablet Take 1 tablet (40 mg total) by mouth daily. 09/02/15   Kathyrn Drown, MD  metoprolol succinate (TOPROL-XL) 50 MG 24 hr tablet TAKE ONE TABLET BY MOUTH ONCE DAILY WITH  OR  IMMEDIATELY  FOLLOWING  A  MEAL 09/02/15   Kathyrn Drown, MD  ondansetron (ZOFRAN-ODT) 8 MG disintegrating tablet Take 1 tablet (8 mg total) by mouth every 8 (eight) hours as needed for nausea or vomiting. 12/24/15   Kathyrn Drown, MD  pantoprazole (PROTONIX) 40 MG tablet Take 1 tablet (40 mg total) by mouth daily. Reported on 01/28/2016 01/28/16   Kathyrn Drown, MD  pravastatin (PRAVACHOL) 80 MG tablet Take 1  tablet (80 mg total) by mouth daily. 09/02/15   Kathyrn Drown, MD  predniSONE (DELTASONE) 20 MG tablet 3qd for 2d then 2qd for 2d then 1qd for 2d 08/25/16   Kathyrn Drown, MD  riTUXimab in sodium chloride 0.9 % 250 mL Infusion 2 weeks apart: May repeat in 6 months. Follow up with primary care doctor prior to initiation of rituximab for infectious clearance 03/11/15   Kathie Dike, MD  vitamin B-12 (CYANOCOBALAMIN) 1000 MCG tablet Take 1 tablet (1,000 mcg total) by mouth daily. 03/11/15   Kathie Dike, MD    Family History Family History  Problem Relation Age of Onset  . Colon cancer Brother   . Anxiety disorder Sister   . Depression Sister   . Anxiety disorder Sister   . Depression Sister     Social History Social History  Substance Use Topics  . Smoking status: Never Smoker  . Smokeless tobacco: Never Used  . Alcohol use No     Comment: 02-13-16 per pt no      Allergies   Penicillins; Levaquin [levofloxacin]; Sulfa antibiotics; and Zithromax [azithromycin]   Review of Systems Review of Systems  Constitutional: Positive for diaphoresis and fatigue. Negative for appetite change.  HENT:  Negative for congestion, ear discharge and sinus pressure.   Eyes: Negative for discharge.  Respiratory: Positive for cough, shortness of breath and wheezing.   Cardiovascular: Negative for chest pain.  Gastrointestinal: Negative for abdominal pain, diarrhea and vomiting.  Genitourinary: Negative for frequency and hematuria.  Musculoskeletal: Negative for back pain.  Skin: Negative for rash.  Neurological: Negative for seizures and headaches.  Psychiatric/Behavioral: Negative for hallucinations.   Physical Exam Updated Vital Signs BP 154/99 (BP Location: Left Arm)   Pulse (!) 124   Temp 98.4 F (36.9 C) (Oral)   Ht 5\' 9"  (1.753 m)   Wt 180 lb (81.6 kg)   SpO2 93%   BMI 26.58 kg/m   Physical Exam  Constitutional: She is oriented to person, place, and time. She appears well-developed.  HENT:  Head: Normocephalic.  Mucous membranes dry   Eyes: Conjunctivae and EOM are normal. No scleral icterus.  Neck: Neck supple. No thyromegaly present.  Cardiovascular: Exam reveals no gallop and no friction rub.   No murmur heard. Irregular rapid heart rate   Pulmonary/Chest: No stridor. She has no wheezes. She has no rales. She exhibits no tenderness.  Wheezing bilaterally.   Abdominal: She exhibits no distension. There is no tenderness. There is no rebound.  Musculoskeletal: Normal range of motion. She exhibits no edema.  Lymphadenopathy:    She has no cervical adenopathy.  Neurological: She is oriented to person, place, and time. She exhibits normal muscle tone. Coordination normal.  Skin: No rash noted. No erythema.  Psychiatric: She has a normal mood and affect. Her behavior is normal.   ED Treatments / Results  Labs (all labs ordered are listed, but only abnormal results are displayed) Labs Reviewed  CBC WITH DIFFERENTIAL/PLATELET  BASIC METABOLIC PANEL    EKG  EKG Interpretation None      Radiology Dg Chest 2 View  Result Date: 09/07/2016 CLINICAL DATA:  Lung  infection.  Cough. EXAM: CHEST  2 VIEW COMPARISON:  08/06/2016. FINDINGS: Cardiac pacer with lead tips in the right atrium and right ventricle. Cardiomegaly with normal pulmonary vascularity. New infiltrate in the left upper lobe consistent pneumonia. No pleural effusion pneumothorax. Diffuse thoracic spine osteopenia degenerative change. Mild mid thoracic spine stable compression fracture. IMPRESSION:  New infiltrate in the left upper lobe consistent with pneumonia. Follow-up chest x-rays to demonstrate clearing suggested. Electronically Signed   By: Marcello Moores  Register   On: 09/07/2016 10:55    Procedures Procedures (including critical care time)  Medications Ordered in ED Medications - No data to display  DIAGNOSTIC STUDIES:  Oxygen Saturation is 93% on RA, normal by my interpretation.    COORDINATION OF CARE:  10:23 AM Discussed treatment Sandoval with pt at bedside and pt agreed to Sandoval.  Initial Impression / Assessment and Sandoval / ED Course  I have reviewed the triage vital signs and the nursing notes.  Pertinent labs & imaging results that were available during my care of the patient were reviewed by me and considered in my medical decision making (see chart for details).  Clinical Course     Patient with community-acquired pneumonia.  She'll be admitted for antibiotics  Final Clinical Impressions(s) / ED Diagnoses   Final diagnoses:  None    New Prescriptions New Prescriptions   No medications on file     Milton Ferguson, MD 09/07/16 1519

## 2016-09-07 NOTE — ED Notes (Addendum)
HR ranging from 25-834, Systolic bp >621 per PRN order to administer labetolol 5MG  IV. Pt tolerated well.

## 2016-09-08 ENCOUNTER — Ambulatory Visit: Payer: Self-pay | Admitting: Orthopaedic Surgery

## 2016-09-08 ENCOUNTER — Encounter (HOSPITAL_COMMUNITY): Payer: Self-pay | Admitting: Cardiology

## 2016-09-08 ENCOUNTER — Inpatient Hospital Stay (HOSPITAL_COMMUNITY): Payer: Medicare Other

## 2016-09-08 DIAGNOSIS — I509 Heart failure, unspecified: Secondary | ICD-10-CM

## 2016-09-08 LAB — CBC WITH DIFFERENTIAL/PLATELET
Basophils Absolute: 0 10*3/uL (ref 0.0–0.1)
Basophils Relative: 0 %
EOS ABS: 0.1 10*3/uL (ref 0.0–0.7)
EOS PCT: 2 %
HEMATOCRIT: 42.5 % (ref 36.0–46.0)
Hemoglobin: 13.8 g/dL (ref 12.0–15.0)
LYMPHS ABS: 3.8 10*3/uL (ref 0.7–4.0)
LYMPHS PCT: 41 %
MCH: 30.7 pg (ref 26.0–34.0)
MCHC: 32.5 g/dL (ref 30.0–36.0)
MCV: 94.7 fL (ref 78.0–100.0)
MONO ABS: 1 10*3/uL (ref 0.1–1.0)
Monocytes Relative: 10 %
Neutro Abs: 4.4 10*3/uL (ref 1.7–7.7)
Neutrophils Relative %: 47 %
Platelets: 178 10*3/uL (ref 150–400)
RBC: 4.49 MIL/uL (ref 3.87–5.11)
RDW: 14 % (ref 11.5–15.5)
WBC: 9.3 10*3/uL (ref 4.0–10.5)

## 2016-09-08 LAB — BASIC METABOLIC PANEL
Anion gap: 7 (ref 5–15)
BUN: 21 mg/dL — AB (ref 6–20)
CHLORIDE: 110 mmol/L (ref 101–111)
CO2: 22 mmol/L (ref 22–32)
CREATININE: 1.46 mg/dL — AB (ref 0.44–1.00)
Calcium: 8.6 mg/dL — ABNORMAL LOW (ref 8.9–10.3)
GFR calc Af Amer: 38 mL/min — ABNORMAL LOW (ref >60)
GFR calc non Af Amer: 33 mL/min — ABNORMAL LOW (ref >60)
GLUCOSE: 105 mg/dL — AB (ref 65–99)
POTASSIUM: 4.4 mmol/L (ref 3.5–5.1)
Sodium: 139 mmol/L (ref 135–145)

## 2016-09-08 LAB — ECHOCARDIOGRAM COMPLETE
Height: 69 in
Weight: 2880 oz

## 2016-09-08 LAB — EXPECTORATED SPUTUM ASSESSMENT W GRAM STAIN, RFLX TO RESP C

## 2016-09-08 LAB — EXPECTORATED SPUTUM ASSESSMENT W REFEX TO RESP CULTURE: SPECIAL REQUESTS: NORMAL

## 2016-09-08 LAB — LACTIC ACID, PLASMA: Lactic Acid, Venous: 2.1 mmol/L (ref 0.5–1.9)

## 2016-09-08 LAB — STREP PNEUMONIAE URINARY ANTIGEN: STREP PNEUMO URINARY ANTIGEN: NEGATIVE

## 2016-09-08 MED ORDER — GUAIFENESIN ER 600 MG PO TB12
1200.0000 mg | ORAL_TABLET | Freq: Two times a day (BID) | ORAL | Status: DC
  Administered 2016-09-08 – 2016-09-11 (×7): 1200 mg via ORAL
  Filled 2016-09-08 (×7): qty 2

## 2016-09-08 MED ORDER — METHYLPREDNISOLONE SODIUM SUCC 125 MG IJ SOLR
60.0000 mg | Freq: Four times a day (QID) | INTRAMUSCULAR | Status: DC
  Administered 2016-09-08 – 2016-09-10 (×9): 60 mg via INTRAVENOUS
  Filled 2016-09-08 (×9): qty 2

## 2016-09-08 MED ORDER — ONDANSETRON HCL 4 MG/2ML IJ SOLN
4.0000 mg | Freq: Four times a day (QID) | INTRAMUSCULAR | Status: DC | PRN
  Administered 2016-09-08 – 2016-09-09 (×3): 4 mg via INTRAVENOUS
  Filled 2016-09-08 (×3): qty 2

## 2016-09-08 MED ORDER — IPRATROPIUM-ALBUTEROL 0.5-2.5 (3) MG/3ML IN SOLN
3.0000 mL | Freq: Four times a day (QID) | RESPIRATORY_TRACT | Status: DC
  Administered 2016-09-08 – 2016-09-09 (×6): 3 mL via RESPIRATORY_TRACT
  Filled 2016-09-08 (×5): qty 3

## 2016-09-08 NOTE — Progress Notes (Signed)
*  PRELIMINARY RESULTS* Echocardiogram 2D Echocardiogram has been performed.  [name] 09/08/2016, 3:06 PM

## 2016-09-08 NOTE — Progress Notes (Signed)
[facility]  PROGRESS NOTE  Kelsey Sandoval ZJI:[id number] DOB: Jul 05, 1933 DOA: 09/07/2016 PCP: Sallee Lange, MD   Brief HPI:   82 y.o. female with medical history significant of membranous nephropathy, HTN, HLD, afib on Pradaxa, and chronic pain presenting with SOB, cough, wheezing.  She reports that "this go around" the cough has been nonproductive.  Ongoing symptoms for about a month now.  3 doctor visits including once to the ER.  Intermittent spells of feeling hot/sweaty.  Feels tired all the time.   Symptoms have gotten better and then worse again, but fatigue never changed.  +weight gain - thinks related to prednisone (craves [employer] and [employer] skins).    Subjective   This morning patient continues to cough has shortness of breath.   Assessment/Plan:     1. Community-acquired pneumonia- patient has failed treatments 2 as outpatient, started on vancomycin and Azactam. Blood cultures 2 are pending, urinary strep pneumo antigen negative. 2. Acute bronchitis/question COPD- start Solu-Medrol 60 mg IV every 6 hours, Mucinex 1 tablet by mouth twice a day, duo nebs every 6 hours. 3. Paroxysmal atrial fibrillation- CHA2DS2VASc score is 4, patient on anti-correlation with Pradaxa. Per Dr. Court Joy last office note, it appears that the patient is not supposed to be continuing to take Flecainide since it was ineffective; however, she has been taking it and so it will be continued for now. Continue Toprol-XL for rate control 4. Hypertension-blood pressure stable, continue Norvasc, lisinopril, Toprol-XL 5. Hypothyroidism- continue Synthroid 6. Pacemaker malfunction- patient was supposed to follow-up with cardiology on Thursday as outpatient for pacemaker check. Will consult cardiology as inpatient to check pacemaker. 7. Chronic pain syndrome-continue Vicodin when necessary 8. Chronic kidney disease stage III- creatinine stable, follow BMP in a.m.    DVT prophylaxis: Patient on  anticoagulation with Pradaxa  Code Status: DO NOT RESUSCITATE  Family Communication: Discussed with patient and daughter at bedside  Disposition Plan: Home when medically stable   Consultants:  None  Procedures:  None  Continuous infusions . lactated ringers 75 mL/hr at 09/08/16 1353      Antibiotics:   Anti-infectives    Start     Dose/Rate Route Frequency Ordered Stop   09/08/16 2000  vancomycin (VANCOCIN) 1,250 mg in sodium chloride 0.9 % 250 mL IVPB     1,250 mg 166.7 mL/hr over 90 Minutes Intravenous Every 24 hours 09/07/16 1828     09/07/16 2000  aztreonam (AZACTAM) 2 g in dextrose 5 % 50 mL IVPB     2 g 100 mL/hr over 30 Minutes Intravenous Every 8 hours 09/07/16 1758 09/15/16 1959   09/07/16 2000  vancomycin (VANCOCIN) 1,500 mg in sodium chloride 0.9 % 500 mL IVPB     1,500 mg 250 mL/hr over 120 Minutes Intravenous  Once 09/07/16 1828 09/08/16 0034   09/07/16 1500  levofloxacin (LEVAQUIN) IVPB 500 mg     500 mg 100 mL/hr over 60 Minutes Intravenous  Once 09/07/16 1451 09/07/16 1601       Objective   Vitals:   09/07/16 2112 09/08/16 0556 09/08/16 1457 09/08/16 1501  BP: (!) 144/89 114/80 (!) 146/90   Pulse: 88 82 85   Resp: 20 20 20    Temp: 98 F (36.7 C) 97.6 F (36.4 C) 97.8 F (36.6 C)   TempSrc: Oral Oral Oral   SpO2: 97% 96% 93% 96%  Weight:      Height:        Intake/Output Summary (Last 24 hours) at 09/08/16  [place] filed at 09/08/16 1000  Gross per 24 hour  Intake          1396.25 ml  Output             1375 ml  Net            21.25 ml   Filed Weights   09/07/16 1007  Weight: 81.6 kg (180 lb)     Physical Examination:  General exam: Appears calm and comfortable. Respiratory system: Bilateral rhonchi on auscultation Cardiovascular system:  RRR. No  murmurs, rubs, gallops. No pedal edema. GI system: Abdomen is nondistended, soft and nontender. No organomegaly.  Central nervous system. No focal neurological deficits. 5  x 5 power in all extremities. Skin: No rashes, lesions or ulcers. Psychiatry: Alert, oriented x 3.Judgement and insight appear normal. Affect normal.    Data Reviewed: I have personally reviewed following labs and imaging studies  CBG: No results for input(s): GLUCAP in the last 168 hours.  CBC:  Recent Labs Lab 09/07/16 1022 09/08/16 0214  WBC 9.4 9.3  NEUTROABS 6.4 4.4  HGB 13.3 13.8  HCT 41.3 42.5  MCV 94.1 94.7  PLT 173 341    Basic Metabolic Panel:  Recent Labs Lab 09/07/16 1022 09/08/16 0214  NA 139 139  K 4.0 4.4  CL 105 110  CO2 26 22  GLUCOSE 106* 105*  BUN 22* 21*  CREATININE 1.49* 1.46*  CALCIUM 8.8* 8.6*    Recent Results (from the past 240 hour(s))  Culture, blood (routine x 2) Call MD if unable to obtain prior to antibiotics being given     Status: None (Preliminary result)   Collection Time: 09/07/16  8:10 PM  Result Value Ref Range Status   Specimen Description RIGHT ANTECUBITAL  Final   Special Requests BOTTLES DRAWN AEROBIC AND ANAEROBIC 6CC  Final   Culture PENDING  Incomplete   Report Status PENDING  Incomplete  Culture, blood (routine x 2) Call MD if unable to obtain prior to antibiotics being given     Status: None (Preliminary result)   Collection Time: 09/07/16  8:22 PM  Result Value Ref Range Status   Specimen Description BLOOD LEFT HAND  Final   Special Requests BOTTLES DRAWN AEROBIC AND ANAEROBIC 6CC  Final   Culture PENDING  Incomplete   Report Status PENDING  Incomplete     Liver Function Tests: No results for input(s): AST, ALT, ALKPHOS, BILITOT, PROT, ALBUMIN in the last 168 hours. No results for input(s): LIPASE, AMYLASE in the last 168 hours. No results for input(s): AMMONIA in the last 168 hours.  Cardiac Enzymes:  Recent Labs Lab 09/07/16 1026  TROPONINI <0.03   BNP (last 3 results)  Recent Labs  08/06/16 1500 09/07/16 1026  BNP 646.0* 232.0*    ProBNP (last 3 results) No results for input(s): PROBNP in  the last 8760 hours.    Studies: Dg Chest 2 View  Result Date: 09/07/2016 CLINICAL DATA:  Lung infection.  Cough. EXAM: CHEST  2 VIEW COMPARISON:  08/06/2016. FINDINGS: Cardiac pacer with lead tips in the right atrium and right ventricle. Cardiomegaly with normal pulmonary vascularity. New infiltrate in the left upper lobe consistent pneumonia. No pleural effusion pneumothorax. Diffuse thoracic spine osteopenia degenerative change. Mild mid thoracic spine stable compression fracture. IMPRESSION: New infiltrate in the left upper lobe consistent with pneumonia. Follow-up chest x-rays to demonstrate clearing suggested. Electronically Signed   By: Marcello Moores  Register   On: 09/07/2016 10:55  Scheduled Meds: . amLODipine  10 mg Oral Daily  . aztreonam  2 g Intravenous Q8H  . busPIRone  10 mg Oral TID  . dabigatran  150 mg Oral BID  . DULoxetine  60 mg Oral BID  . flecainide  50 mg Oral BID  . gabapentin  300 mg Oral TID  . guaiFENesin  1,200 mg Oral BID  . ipratropium-albuterol  3 mL Nebulization Q6H  . levothyroxine  50 mcg Oral QAC breakfast  . lisinopril  40 mg Oral Daily  . methylPREDNISolone (SOLU-MEDROL) injection  60 mg Intravenous Q6H  . metoprolol succinate  50 mg Oral Daily  . pantoprazole  40 mg Oral Daily  . pravastatin  80 mg Oral q1800  . vancomycin  1,250 mg Intravenous Q24H      Time spent: 25 minutes  [facility] [facility] Pager [phone]. If 7PM-7AM, please contact night-coverage at [email or web address], Office  [phone]  password [email or web address] 09/08/2016, 3:38 PM  LOS: 1 day

## 2016-09-08 NOTE — Care Management Note (Signed)
Case Management Note  Patient Details  Name: Kelsey Sandoval MRN: [id number] Date of Birth: Mar 16, 1933  Subjective/Objective:                  Pt admitted with CAP. Pt is from home alone. She has PCP - Dr. Wolfgang Phoenix. Pt has insurance with drug coverage, She manages her own medications. She has family for support and assistance if she needs. She uses a cane with mobility. No HH services currently or in the past.   Action/Plan: Plan for return home with self care. Pt is in agreement with plan. No CM needs anticipated.   Expected Discharge Date:       09/10/2016           Expected Discharge Plan:  Home/Self Care  In-House Referral:  NA  Discharge planning Services  CM Consult  Post Acute Care Choice:  NA Choice offered to:  NA  Status of Service:  Completed, signed off  Sherald Barge, RN 09/08/2016, 1:04 PM

## 2016-09-08 NOTE — Progress Notes (Signed)
CRITICAL VALUE ALERT  Critical value received:  Lactic acid 2.2  Date of notification:  09/07/16  Time of notification:  2240  Critical value read back:Yes.    Nurse who received alert:  B. Joaquim Lai, RN  MD notified (1st page):  Tylene Fantasia  Time of first page:  2300  Responding MD:  Tylene Fantasia  Time MD responded:  [phone]

## 2016-09-09 DIAGNOSIS — J181 Lobar pneumonia, unspecified organism: Secondary | ICD-10-CM

## 2016-09-09 LAB — BASIC METABOLIC PANEL
Anion gap: 6 (ref 5–15)
BUN: 22 mg/dL — AB (ref 6–20)
CALCIUM: 8.7 mg/dL — AB (ref 8.9–10.3)
CHLORIDE: 111 mmol/L (ref 101–111)
CO2: 21 mmol/L — ABNORMAL LOW (ref 22–32)
CREATININE: 1.24 mg/dL — AB (ref 0.44–1.00)
GFR, EST AFRICAN AMERICAN: 46 mL/min — AB (ref >60)
GFR, EST NON AFRICAN AMERICAN: 40 mL/min — AB (ref >60)
Glucose, Bld: 201 mg/dL — ABNORMAL HIGH (ref 65–99)
Potassium: 4.4 mmol/L (ref 3.5–5.1)
SODIUM: 138 mmol/L (ref 135–145)

## 2016-09-09 MED ORDER — DEXTROSE 5 % IV SOLN
2.0000 g | INTRAVENOUS | Status: DC
  Administered 2016-09-09 – 2016-09-10 (×2): 2 g via INTRAVENOUS
  Filled 2016-09-09 (×7): qty 2

## 2016-09-09 MED ORDER — DABIGATRAN ETEXILATE MESYLATE 150 MG PO CAPS
150.0000 mg | ORAL_CAPSULE | Freq: Two times a day (BID) | ORAL | Status: DC
  Administered 2016-09-09 – 2016-09-11 (×4): 150 mg via ORAL
  Filled 2016-09-09 (×10): qty 1

## 2016-09-09 MED ORDER — DABIGATRAN ETEXILATE MESYLATE 75 MG PO CAPS
75.0000 mg | ORAL_CAPSULE | Freq: Two times a day (BID) | ORAL | Status: DC
  Filled 2016-09-09 (×4): qty 1

## 2016-09-09 NOTE — Progress Notes (Signed)
Patient scheduled for a pacer check tomorrow in our office. They will come to the floor and check her pacemaker tomorrow as an inpatient. Have made patient aware.  Ermalinda Barrios PA-C

## 2016-09-09 NOTE — Progress Notes (Signed)
Pharmacy Antibiotic Note  Kelsey Sandoval is a 82 y.o. female admitted on 09/07/2016 with pneumonia.  Pharmacy has been consulted for Cefepime dosing. Patient slowly improving.    Plan: Cefepime 2gm IV q24h Monitor labs, micro and vitals.   Height: 5\' 9"  (175.3 cm) Weight: 180 lb (81.6 kg) IBW/kg (Calculated) : 66.2  Temp (24hrs), Avg:98.2 F (36.8 C), Min:97.8 F (36.6 C), Max:98.4 F (36.9 C)   Recent Labs Lab 09/07/16 1022 09/07/16 2156 09/08/16 0214 09/09/16 0552  WBC 9.4  --  9.3  --   CREATININE 1.49*  --  1.46* 1.24*  LATICACIDVEN  --  2.2* 2.1*  --     Estimated Creatinine Clearance: 40.7 mL/min (by C-G formula based on SCr of 1.24 mg/dL (H)).    Allergies  Allergen Reactions  . Penicillins Other (See Comments)    Caused patient to pass out.Can take cephalosporins Has patient had a PCN reaction causing immediate rash, facial/tongue/throat swelling, SOB or lightheadedness with hypotension: no Has patient had a PCN reaction causing severe rash involving mucus membranes or skin necrosis: No Has patient had a PCN reaction that required hospitalization No Has patient had a PCN reaction occurring within the last 10 years: No If all of the above answers are "NO", then may proceed with Cephalosporin use.   . Levaquin [Levofloxacin] Nausea Only  . Sulfa Antibiotics Other (See Comments)    Unknown  . Zithromax [Azithromycin] Nausea Only and Rash    Antimicrobials this admission: Vancomycin 12/4 >> 12/6 Aztreonam 12/4 >> 12/6 Cefepime 12/6>>  Dose adjustments this admission: n/a  Microbiology results: 12/4 Blood cx: pending 12/4 Sputum cx; GPC, GPR and GNR  Thank you for allowing pharmacy to be a part of this patient's care.  Isac Sarna, BS Pharm D, [place] [facility] Pharmacist Pager [phone] 09/09/2016 10:28 AM

## 2016-09-09 NOTE — Progress Notes (Signed)
PROGRESS NOTE    Kelsey Sandoval  OXB:[id number] DOB: [birth year] DOA: 09/07/2016 PCP: Sallee Lange, MD    Brief Narrative:  82 y.o.femalewith medical history significant of membranous nephropathy, HTN, HLD, afib on Pradaxa, and chronic pain presenting with SOB, cough, wheezing. She reports that "this go around" the cough has beennonproductive. Ongoing symptoms for about a month now. 3 doctor visits including once to the ER. Intermittent spells of feeling hot/sweaty. Feels tired all the time. Symptoms have gotten better and then worse again, but fatigue never changed. +weight gain - thinks related to prednisone (craves [employer] and meat skins).   Assessment & Plan:   Principal Problem:   Pneumonia Active Problems:   Paroxysmal atrial fibrillation (HCC)   Systemic hypertension   Hypothyroidism   Chronic pain syndrome   Chronic anticoagulation   Chronic kidney disease (CKD), stage III (moderate)  Community-acquired pneumonia - patient has failed treatments 2 as outpatient - on vancomycin and Azactam at admission - has tolerated cephalosporins previously - will transition to cefepime - Blood cultures 2 negative x 2 days - urinary strep pneumo antigen negative  Acute bronchitis/question COPD-  - Solu-Medrol 60 mg IV every 6 hours - Mucinex 1 tablet by mouth twice a day - duo nebs every 6 hours.  Paroxysmal atrial fibrillation - CHA2DS2VASc score is 4 - patient on anti-coagulation with Pradaxa - Per Dr. Court Joy last office note, it appears that the patient is not supposed to be continuing to take Flecainide since it was ineffective; however, she has been taking it and so it will be continued for now - Continue Toprol-XL for rate control  Hypertension -blood pressure stable - continue Norvasc, lisinopril, Toprol-XL  Hypothyroidism- continue Synthroid  Pacemaker malfunction - pacemaker to be interrogated tomorrow in hospital  Chronic pain syndrome -  continue Vicodin when necessary  Chronic kidney disease stage III - creatinine stable, follow BMP in a.m.   DVT prophylaxis: Pradaxa (will need to be dose adjusted for kidney function) Code Status: DNR Family Communication: Discussed with patient and daughter bedside Disposition Plan: home when medically stable   Consultants:   None  Procedures:   None  Antimicrobials:   Vancomycin 12/4> 12/5  Azactam 12/4>>>12/12   Levaquin 12/4   Subjective: Patient says she is having a rough day.  She got off the phone with her granddaughter and is very concerned about her great grandchildren's Christmas gifts. She states to me that her breathing seems to have gotten better but she is still having some congestion.  She became very emotional when she discussed getting her pacer interrogated.  Objective: Vitals:   09/09/16 0128 09/09/16 0751 09/09/16 0936 09/09/16 1430  BP:   135/73   Pulse:   (!) 110   Resp:      Temp:   98.4 F (36.9 C)   TempSrc:   Oral   SpO2: 94% 94% 95% 91%  Weight:      Height:        Intake/Output Summary (Last 24 hours) at 09/09/16 1500 Last data filed at 09/09/16 1100  Gross per 24 hour  Intake           2732.5 ml  Output             1900 ml  Net            832.5 ml   Filed Weights   09/07/16 1007  Weight: 81.6 kg (180 lb)    Examination:  General exam: Appears calm  and comfortable  Respiratory system:  Respiratory effort normal.  Rhonchi in upper lung fields Cardiovascular system: S1 & S2 heard, RRR. No JVD, murmurs, rubs, gallops or clicks. No pedal edema. Gastrointestinal system: Abdomen is nondistended, soft and nontender. No organomegaly or masses felt. Normal bowel sounds heard. Central nervous system: Alert and oriented. No focal neurological deficits. Extremities: Symmetric 5 x 5 power. Skin: No rashes, lesions or ulcers Psychiatry: Judgement and insight appear normal. emotional.     Data Reviewed: I have personally reviewed  following labs and imaging studies  CBC:  Recent Labs Lab 09/07/16 1022 09/08/16 0214  WBC 9.4 9.3  NEUTROABS 6.4 4.4  HGB 13.3 13.8  HCT 41.3 42.5  MCV 94.1 94.7  PLT 173 502   Basic Metabolic Panel:  Recent Labs Lab 09/07/16 1022 09/08/16 0214 09/09/16 0552  NA 139 139 138  K 4.0 4.4 4.4  CL 105 110 111  CO2 26 22 21*  GLUCOSE 106* 105* 201*  BUN 22* 21* 22*  CREATININE 1.49* 1.46* 1.24*  CALCIUM 8.8* 8.6* 8.7*   GFR: Estimated Creatinine Clearance: 40.7 mL/min (by C-G formula based on SCr of 1.24 mg/dL (H)). Liver Function Tests: No results for input(s): AST, ALT, ALKPHOS, BILITOT, PROT, ALBUMIN in the last 168 hours. No results for input(s): LIPASE, AMYLASE in the last 168 hours. No results for input(s): AMMONIA in the last 168 hours. Coagulation Profile: No results for input(s): INR, PROTIME in the last 168 hours. Cardiac Enzymes:  Recent Labs Lab 09/07/16 1026  TROPONINI <0.03   BNP (last 3 results) No results for input(s): PROBNP in the last 8760 hours. HbA1C: No results for input(s): HGBA1C in the last 72 hours. CBG: No results for input(s): GLUCAP in the last 168 hours. Lipid Profile: No results for input(s): CHOL, HDL, LDLCALC, TRIG, CHOLHDL, LDLDIRECT in the last 72 hours. Thyroid Function Tests:  Recent Labs  09/07/16 2011  TSH 1.300   Anemia Panel: No results for input(s): VITAMINB12, FOLATE, FERRITIN, TIBC, IRON, RETICCTPCT in the last 72 hours. Sepsis Labs:  Recent Labs Lab 09/07/16 2156 09/08/16 0214  LATICACIDVEN 2.2* 2.1*    Recent Results (from the past 240 hour(s))  Culture, sputum-assessment     Status: None   Collection Time: 09/07/16  5:59 PM  Result Value Ref Range Status   Specimen Description EXPECTORATED SPUTUM  Final   Special Requests Normal  Final   Sputum evaluation   Final    THIS SPECIMEN IS ACCEPTABLE. RESPIRATORY CULTURE REPORT TO FOLLOW. PERFORMED AT [facility]    Report Status 09/08/2016 FINAL  Final    Culture, blood (routine x 2) Call MD if unable to obtain prior to antibiotics being given     Status: None (Preliminary result)   Collection Time: 09/07/16  8:10 PM  Result Value Ref Range Status   Specimen Description RIGHT ANTECUBITAL  Final   Special Requests BOTTLES DRAWN AEROBIC AND ANAEROBIC 6CC  Final   Culture NO GROWTH 2 DAYS  Final   Report Status PENDING  Incomplete  Culture, blood (routine x 2) Call MD if unable to obtain prior to antibiotics being given     Status: None (Preliminary result)   Collection Time: 09/07/16  8:22 PM  Result Value Ref Range Status   Specimen Description BLOOD LEFT HAND  Final   Special Requests BOTTLES DRAWN AEROBIC AND ANAEROBIC 6CC  Final   Culture NO GROWTH 2 DAYS  Final   Report Status PENDING  Incomplete  Culture, respiratory (NON-Expectorated)  Status: None (Preliminary result)   Collection Time: 09/08/16  7:10 PM  Result Value Ref Range Status   Specimen Description SPUTUM  Final   Special Requests NONE  Final   Gram Stain   Final    FEW WBC PRESENT,BOTH PMN AND MONONUCLEAR MODERATE GRAM POSITIVE COCCI IN PAIRS FEW GRAM POSITIVE RODS RARE GRAM NEGATIVE RODS ABUNDANT YEAST    Culture   Final    TOO YOUNG TO READ Performed at [facility]    Report Status PENDING  Incomplete         Radiology Studies: No results found.      Scheduled Meds: . amLODipine  10 mg Oral Daily  . busPIRone  10 mg Oral TID  . ceFEPime (MAXIPIME) IV  2 g Intravenous Q24H  . dabigatran  150 mg Oral BID  . DULoxetine  60 mg Oral BID  . flecainide  50 mg Oral BID  . gabapentin  300 mg Oral TID  . guaiFENesin  1,200 mg Oral BID  . ipratropium-albuterol  3 mL Nebulization Q6H  . levothyroxine  50 mcg Oral QAC breakfast  . lisinopril  40 mg Oral Daily  . methylPREDNISolone (SOLU-MEDROL) injection  60 mg Intravenous Q6H  . metoprolol succinate  50 mg Oral Daily  . pantoprazole  40 mg Oral Daily  . pravastatin  80 mg Oral q1800    Continuous Infusions: . lactated ringers 75 mL/hr at 09/09/16 0433     LOS: 2 days    Time spent: 30 minutes    Loretha Stapler, MD [facility] Pager [phone]  If 7PM-7AM, please contact night-coverage [email or web address] Password [email or web address] 09/09/2016, 3:00 PM

## 2016-09-10 ENCOUNTER — Ambulatory Visit (INDEPENDENT_AMBULATORY_CARE_PROVIDER_SITE_OTHER): Payer: Medicare Other | Admitting: *Deleted

## 2016-09-10 DIAGNOSIS — Z950 Presence of cardiac pacemaker: Secondary | ICD-10-CM

## 2016-09-10 DIAGNOSIS — R627 Adult failure to thrive: Secondary | ICD-10-CM

## 2016-09-10 DIAGNOSIS — I480 Paroxysmal atrial fibrillation: Secondary | ICD-10-CM

## 2016-09-10 DIAGNOSIS — D899 Disorder involving the immune mechanism, unspecified: Secondary | ICD-10-CM

## 2016-09-10 LAB — CUP PACEART INCLINIC DEVICE CHECK
Battery Voltage: 2.79 V
Brady Statistic AP VS Percent: 24 %
Brady Statistic AS VS Percent: 75 %
Date Time Interrogation Session: 20171207130259
Implantable Lead Implant Date: 20120810
Implantable Lead Location: 753859
Lead Channel Impedance Value: 632 Ohm
Lead Channel Pacing Threshold Pulse Width: 0.4 ms
Lead Channel Setting Pacing Pulse Width: 0.4 ms
MDC IDC LEAD IMPLANT DT: 20120810
MDC IDC LEAD LOCATION: 753860
MDC IDC MSMT BATTERY IMPEDANCE: 251 Ohm
MDC IDC MSMT BATTERY REMAINING LONGEVITY: 127 mo
MDC IDC MSMT LEADCHNL RA IMPEDANCE VALUE: 439 Ohm
MDC IDC MSMT LEADCHNL RA SENSING INTR AMPL: 1 mV
MDC IDC MSMT LEADCHNL RV PACING THRESHOLD AMPLITUDE: 1 V
MDC IDC MSMT LEADCHNL RV SENSING INTR AMPL: 11.2 mV
MDC IDC PG IMPLANT DT: 20120810
MDC IDC SET LEADCHNL RA PACING AMPLITUDE: 2 V
MDC IDC SET LEADCHNL RV PACING AMPLITUDE: 2.5 V
MDC IDC SET LEADCHNL RV SENSING SENSITIVITY: 4 mV
MDC IDC STAT BRADY AP VP PERCENT: 1 %
MDC IDC STAT BRADY AS VP PERCENT: 1 %

## 2016-09-10 MED ORDER — PREDNISONE 20 MG PO TABS
60.0000 mg | ORAL_TABLET | Freq: Every day | ORAL | Status: DC
  Administered 2016-09-11: 60 mg via ORAL
  Filled 2016-09-10: qty 3

## 2016-09-10 MED ORDER — IPRATROPIUM-ALBUTEROL 0.5-2.5 (3) MG/3ML IN SOLN
3.0000 mL | Freq: Four times a day (QID) | RESPIRATORY_TRACT | Status: DC
  Administered 2016-09-10 – 2016-09-11 (×4): 3 mL via RESPIRATORY_TRACT
  Filled 2016-09-10 (×4): qty 3

## 2016-09-10 MED ORDER — BISACODYL 10 MG RE SUPP
10.0000 mg | Freq: Every day | RECTAL | Status: DC
  Administered 2016-09-10: 10 mg via RECTAL
  Filled 2016-09-10 (×2): qty 1

## 2016-09-10 MED ORDER — SENNOSIDES-DOCUSATE SODIUM 8.6-50 MG PO TABS
1.0000 | ORAL_TABLET | Freq: Two times a day (BID) | ORAL | Status: DC
  Administered 2016-09-10 – 2016-09-11 (×2): 1 via ORAL
  Filled 2016-09-10 (×2): qty 1

## 2016-09-10 MED ORDER — BUSPIRONE HCL 5 MG PO TABS
7.5000 mg | ORAL_TABLET | Freq: Three times a day (TID) | ORAL | Status: DC
  Administered 2016-09-10 – 2016-09-11 (×2): 7.5 mg via ORAL
  Filled 2016-09-10 (×2): qty 2

## 2016-09-10 MED ORDER — FLUCONAZOLE IN SODIUM CHLORIDE 200-0.9 MG/100ML-% IV SOLN
200.0000 mg | Freq: Once | INTRAVENOUS | Status: AC
  Administered 2016-09-10: 200 mg via INTRAVENOUS
  Filled 2016-09-10: qty 100

## 2016-09-10 MED ORDER — DULOXETINE HCL 60 MG PO CPEP: 60.0000 mg | ORAL_CAPSULE | Freq: Every day | ORAL | Status: DC

## 2016-09-10 MED ORDER — FLUCONAZOLE 100 MG PO TABS
100.0000 mg | ORAL_TABLET | Freq: Every day | ORAL | Status: DC
  Administered 2016-09-11: 100 mg via ORAL
  Filled 2016-09-10: qty 1

## 2016-09-10 NOTE — Progress Notes (Signed)
Pacemaker check at [facility] while patient admitted. Normal device function. Thresholds, sensing, impedances consistent with previous measurements. Device programmed to maximize longevity. 404 mode switches (51.7%), AF +Pradaxa and flecainide. No high ventricular rates noted. Device programmed at appropriate safety margins; RV min output fixed at 2.5V. Histogram distribution appropriate for patient activity level. Device programmed to optimize intrinsic conduction. Estimated longevity 10.5 years. Patient enrolled in remote follow-up, but requests new [employer] monitor. [id number] monitor ordered to home address per pt request, pt to call [facility] when it arrives for assistance setting it up. Patient education completed. [employer] on 12/10/16 and ROV with GT/R in 03/2017.

## 2016-09-10 NOTE — Progress Notes (Signed)
PROGRESS NOTE    Kelsey Sandoval  EUM:[id number] DOB: 23-Aug-1933 DOA: 09/07/2016 PCP: Sallee Lange, MD    Brief Narrative:  82 y.o.femalewith medical history significant of membranous nephropathy, HTN, HLD, afib on Pradaxa, and chronic pain presenting with SOB, cough, wheezing. She reports that "this go around" the cough has beennonproductive. Ongoing symptoms for about a month now. 3 doctor visits including once to the ER. Intermittent spells of feeling hot/sweaty. Feels tired all the time. Symptoms have gotten better and then worse again, but fatigue never changed. +weight gain - thinks related to prednisone (craves [employer] and [employer] skins).   Assessment & Plan:   Principal Problem:   Pneumonia Active Problems:   Paroxysmal atrial fibrillation (HCC)   Systemic hypertension   Hypothyroidism   Chronic pain syndrome   Chronic anticoagulation   Chronic kidney disease (CKD), stage III (moderate)  Community-acquired pneumonia in a immunosuppressed individual ( has been on rituximab for membrnous nephropathy - patient has failed treatments 2 as outpatient - on vancomycin and Azactam at admission - has tolerated cephalosporins previously - will transition to cefepime - Blood cultures 2 negative x 2 days, sputum culture +abundant yeast, moderate enterococcus, sensitivity pending - urinary strep pneumo antigen negative Add diflucan due to underline possible immunosuppressed state  Acute bronchitis/lifelong nonsmoker - Solu-Medrol 60 mg IV every 6 hours, no wheezing on 12/7, taper steroids - Mucinex 1 tablet by mouth twice a day - duo nebs every 6 hours.  Paroxysmal atrial fibrillation - CHA2DS2VASc score is 4 - patient on anti-coagulation with Pradaxa - Per Dr. Court Joy last office note, it appears that the patient is not supposed to be continuing to take Flecainide since it was ineffective; however, she has been taking it and so it will be continued for now -  Continue Toprol-XL for rate control  Hypertension -blood pressure stable - continue Norvasc, lisinopril, Toprol-XL  Hypothyroidism- continue Synthroid  Pacemaker malfunction - pacemaker to be interrogated tomorrow in hospital  Chronic pain syndrome - continue Vicodin when necessary  Chronic kidney disease stage III - creatinine stable, follow BMP in a.m.  Proteinuria: with h/o membranous nephropathy, Continue lisinopril, need to follow up with [facility] nephrology ""1st biopsy in remote past, achieved remission after steroid. 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 "  Chronic fatigue: she reported progressive fatigue in the last few months, tsh wnl. she also report that she has been started on buspar and cymbalta a few months ago, for anxiety, she states she did not see these meds has helped her,  Will taper buspar and cymbalta  Anxiety: report xanax has help. Will continue 1mg  po qhs prn  FTT: report recurrent infection, progressive weakness, will get PT eval, likely will need home health       DVT prophylaxis: Pradaxa (will need to be dose adjusted for kidney function) Code Status: DNR Family Communication: Discussed with patient and son on 12/7 Disposition Plan: home when medically stable in 1-2 days, awaiting sputum culture, may need home health, PT pending   Consultants:   Cardiology (pace maker interrogated on 12/7)  Procedures:  pace maker interrogated on 12/7)  Antimicrobials:   Vancomycin 12/4> 12/5  Azactam 12/4>>>12/12   Levaquin 12/4  Cefepime from 12/6   Subjective:  Anxious elderly female with multiple complaints. C/o being constipated, c/o morning headache, c/o chronic fatigue. Report appetite  is better .  Objective: Vitals:   09/09/16 1930 09/09/16 2054 09/10/16 0600 09/10/16 0719  BP:  123/64 128/66   Pulse:  86 84 85   Resp: 18 18 18    Temp:  98.4 F (36.9 C) 98.5 F (36.9 C)   TempSrc:  Oral Oral   SpO2: 93% 94% 95% 94%  Weight:      Height:        Intake/Output Summary (Last 24 hours) at 09/10/16 1119 Last data filed at 09/10/16 0600  Gross per 24 hour  Intake          2086.25 ml  Output              500 ml  Net          1586.25 ml   Filed Weights   09/07/16 1007  Weight: 81.6 kg (180 lb)    Examination:  General exam: Appears calm and comfortable  Respiratory system:  Respiratory effort normal.  No wheezing, no rales, no rhonchi Cardiovascular system: S1 & S2 heard, RRR. No JVD, murmurs, rubs, gallops or clicks. No pedal edema. Gastrointestinal system: Abdomen is nondistended, soft and nontender. No organomegaly or masses felt. Normal bowel sounds heard. Central nervous system: Alert and oriented. No focal neurological deficits. Extremities: Symmetric 5 x 5 power. Skin: No rashes, lesions or ulcers Psychiatry: anxious,     Data Reviewed: I have personally reviewed following labs and imaging studies  CBC:  Recent Labs Lab 09/07/16 1022 09/08/16 0214  WBC 9.4 9.3  NEUTROABS 6.4 4.4  HGB 13.3 13.8  HCT 41.3 42.5  MCV 94.1 94.7  PLT 173 017   Basic Metabolic Panel:  Recent Labs Lab 09/07/16 1022 09/08/16 0214 09/09/16 0552  NA 139 139 138  K 4.0 4.4 4.4  CL 105 110 111  CO2 26 22 21*  GLUCOSE 106* 105* 201*  BUN 22* 21* 22*  CREATININE 1.49* 1.46* 1.24*  CALCIUM 8.8* 8.6* 8.7*   GFR: Estimated Creatinine Clearance: 40.7 mL/min (by C-G formula based on SCr of 1.24 mg/dL (H)). Liver Function Tests: No results for input(s): AST, ALT, ALKPHOS, BILITOT, PROT, ALBUMIN in the last 168 hours. No results for input(s): LIPASE, AMYLASE in the last 168 hours. No results for input(s): AMMONIA in the last 168 hours. Coagulation Profile: No results for input(s): INR, PROTIME in the last 168 hours. Cardiac Enzymes:  Recent Labs Lab 09/07/16 1026  TROPONINI  <0.03   BNP (last 3 results) No results for input(s): PROBNP in the last 8760 hours. HbA1C: No results for input(s): HGBA1C in the last 72 hours. CBG: No results for input(s): GLUCAP in the last 168 hours. Lipid Profile: No results for input(s): CHOL, HDL, LDLCALC, TRIG, CHOLHDL, LDLDIRECT in the last 72 hours. Thyroid Function Tests:  Recent Labs  09/07/16 2011  TSH 1.300   Anemia Panel: No results for input(s): VITAMINB12, FOLATE, FERRITIN, TIBC, IRON, RETICCTPCT in the last 72 hours. Sepsis Labs:  Recent Labs Lab 09/07/16 2156 09/08/16 0214  LATICACIDVEN 2.2* 2.1*    Recent Results (from the past 240 hour(s))  Culture, sputum-assessment     Status: None   Collection Time: 09/07/16  5:59 PM  Result Value Ref Range Status   Specimen Description EXPECTORATED SPUTUM  Final   Special Requests Normal  Final   Sputum evaluation   Final    THIS SPECIMEN IS ACCEPTABLE. RESPIRATORY CULTURE REPORT TO FOLLOW. PERFORMED AT [facility]    Report Status 09/08/2016 FINAL  Final  Culture,  blood (routine x 2) Call MD if unable to obtain prior to antibiotics being given     Status: None (Preliminary result)   Collection Time: 09/07/16  8:10 PM  Result Value Ref Range Status   Specimen Description RIGHT ANTECUBITAL  Final   Special Requests BOTTLES DRAWN AEROBIC AND ANAEROBIC 6CC  Final   Culture NO GROWTH 2 DAYS  Final   Report Status PENDING  Incomplete  Culture, blood (routine x 2) Call MD if unable to obtain prior to antibiotics being given     Status: None (Preliminary result)   Collection Time: 09/07/16  8:22 PM  Result Value Ref Range Status   Specimen Description BLOOD LEFT HAND  Final   Special Requests BOTTLES DRAWN AEROBIC AND ANAEROBIC 6CC  Final   Culture NO GROWTH 2 DAYS  Final   Report Status PENDING  Incomplete  Culture, respiratory (NON-Expectorated)     Status: None (Preliminary result)   Collection Time: 09/08/16  7:10 PM  Result Value Ref Range Status   Specimen  Description SPUTUM  Final   Special Requests NONE  Final   Gram Stain   Final    FEW WBC PRESENT,BOTH PMN AND MONONUCLEAR MODERATE GRAM POSITIVE COCCI IN PAIRS FEW GRAM POSITIVE RODS RARE GRAM NEGATIVE RODS ABUNDANT YEAST    Culture   Final    CULTURE REINCUBATED FOR BETTER GROWTH Performed at [facility]    Report Status PENDING  Incomplete         Radiology Studies: No results found.      Scheduled Meds: . amLODipine  10 mg Oral Daily  . bisacodyl  10 mg Rectal Daily  . busPIRone  10 mg Oral TID  . ceFEPime (MAXIPIME) IV  2 g Intravenous Q24H  . dabigatran  150 mg Oral BID  . DULoxetine  60 mg Oral BID  . flecainide  50 mg Oral BID  . gabapentin  300 mg Oral TID  . guaiFENesin  1,200 mg Oral BID  . ipratropium-albuterol  3 mL Nebulization Q6H WA  . levothyroxine  50 mcg Oral QAC breakfast  . lisinopril  40 mg Oral Daily  . methylPREDNISolone (SOLU-MEDROL) injection  60 mg Intravenous Q6H  . metoprolol succinate  50 mg Oral Daily  . pantoprazole  40 mg Oral Daily  . pravastatin  80 mg Oral q1800  . senna-docusate  1 tablet Oral BID   Continuous Infusions:    LOS: 3 days    Time spent: 47 minutes    Shavar Gorka, MD PhD [facility] Pager [phone]  If 7PM-7AM, please contact night-coverage [email or web address] Password [email or web address] 09/10/2016, 11:19 AM

## 2016-09-10 NOTE — Progress Notes (Addendum)
Device check shows normal functioning pacemaker. Episodes of afib consistent with her known history of PAF, she is on medical therapy. Please contact us if needed for further cardiology consultation this admission. We will sign off.    J BrancH MD

## 2016-09-11 DIAGNOSIS — N183 Chronic kidney disease, stage 3 (moderate): Secondary | ICD-10-CM

## 2016-09-11 DIAGNOSIS — J181 Lobar pneumonia, unspecified organism: Secondary | ICD-10-CM | POA: Diagnosis present

## 2016-09-11 DIAGNOSIS — I10 Essential (primary) hypertension: Secondary | ICD-10-CM

## 2016-09-11 DIAGNOSIS — J209 Acute bronchitis, unspecified: Secondary | ICD-10-CM | POA: Diagnosis present

## 2016-09-11 DIAGNOSIS — J208 Acute bronchitis due to other specified organisms: Secondary | ICD-10-CM

## 2016-09-11 LAB — BASIC METABOLIC PANEL
ANION GAP: 7 (ref 5–15)
BUN: 30 mg/dL — ABNORMAL HIGH (ref 6–20)
CHLORIDE: 110 mmol/L (ref 101–111)
CO2: 23 mmol/L (ref 22–32)
Calcium: 9 mg/dL (ref 8.9–10.3)
Creatinine, Ser: 1.15 mg/dL — ABNORMAL HIGH (ref 0.44–1.00)
GFR calc non Af Amer: 43 mL/min — ABNORMAL LOW (ref >60)
GFR, EST AFRICAN AMERICAN: 50 mL/min — AB (ref >60)
Glucose, Bld: 133 mg/dL — ABNORMAL HIGH (ref 65–99)
POTASSIUM: 3.9 mmol/L (ref 3.5–5.1)
Sodium: 140 mmol/L (ref 135–145)

## 2016-09-11 LAB — CULTURE, RESPIRATORY W GRAM STAIN

## 2016-09-11 LAB — CULTURE, RESPIRATORY

## 2016-09-11 MED ORDER — FLUCONAZOLE 100 MG PO TABS: 100.0000 mg | ORAL_TABLET | Freq: Every day | ORAL | 0 refills | Status: AC

## 2016-09-11 MED ORDER — PREDNISONE 20 MG PO TABS: 40.0000 mg | ORAL_TABLET | Freq: Every day | ORAL | 0 refills | Status: AC

## 2016-09-11 MED ORDER — GUAIFENESIN ER 600 MG PO TB12: 1200.0000 mg | ORAL_TABLET | Freq: Two times a day (BID) | ORAL | 0 refills | Status: DC

## 2016-09-11 MED ORDER — CEPHALEXIN 500 MG PO CAPS: 500.0000 mg | ORAL_CAPSULE | Freq: Two times a day (BID) | ORAL | 0 refills | Status: AC

## 2016-09-11 MED ORDER — DOXYCYCLINE HYCLATE 100 MG PO TABS: 100.0000 mg | ORAL_TABLET | Freq: Two times a day (BID) | ORAL | 0 refills | Status: AC

## 2016-09-11 NOTE — Discharge Instructions (Signed)
Community-Acquired Pneumonia, Adult °Pneumonia is an infection of the lungs. There are different types of pneumonia. One type can develop while a person is in a hospital. A different type, called community-acquired pneumonia, develops in people who are not, or have not recently been, in the hospital or other health care facility. °What are the causes? °Pneumonia may be caused by bacteria, viruses, or funguses. Community-acquired pneumonia is often caused by Streptococcus pneumonia bacteria. These bacteria are often passed from one person to another by breathing in droplets from the cough or sneeze of an infected person. °What increases the risk? °The condition is more likely to develop in: °· People who have chronic diseases, such as chronic obstructive pulmonary disease (COPD), asthma, congestive heart failure, cystic fibrosis, diabetes, or kidney disease. °· People who have early-stage or late-stage HIV. °· People who have sickle cell disease. °· People who have had their spleen removed (splenectomy). °· People who have poor dental hygiene. °· People who have medical conditions that increase the risk of breathing in (aspirating) secretions their own mouth and nose. °· People who have a weakened immune system (immunocompromised). °· People who smoke. °· People who travel to areas where pneumonia-causing germs commonly exist. °· People who are around animal habitats or animals that have pneumonia-causing germs, including birds, bats, rabbits, cats, and farm animals. ° °What are the signs or symptoms? °Symptoms of this condition include: °· A dry cough. °· A wet (productive) cough. °· Fever. °· Sweating. °· Chest pain, especially when breathing deeply or coughing. °· Rapid breathing or difficulty breathing. °· Shortness of breath. °· Shaking chills. °· Fatigue. °· Muscle aches. ° °How is this diagnosed? °Your health care provider will take a medical history and perform a physical exam. You may also have other tests,  including: °· Imaging studies of your chest, including X-rays. °· Tests to check your blood oxygen level and other blood gases. °· Other tests on blood, mucus (sputum), fluid around your lungs (pleural fluid), and urine. ° °If your pneumonia is severe, other tests may be done to identify the specific cause of your illness. °How is this treated? °The type of treatment that you receive depends on many factors, such as the cause of your pneumonia, the medicines you take, and other medical conditions that you have. For most adults, treatment and recovery from pneumonia may occur at home. In some cases, treatment must happen in a hospital. Treatment may include: °· Antibiotic medicines, if the pneumonia was caused by bacteria. °· Antiviral medicines, if the pneumonia was caused by a virus. °· Medicines that are given by mouth or through an IV tube. °· Oxygen. °· Respiratory therapy. ° °Although rare, treating severe pneumonia may include: °· Mechanical ventilation. This is done if you are not breathing well on your own and you cannot maintain a safe blood oxygen level. °· Thoracentesis. This procedure removes fluid around one lung or both lungs to help you breathe better. ° °Follow these instructions at home: °· Take over-the-counter and prescription medicines only as told by your health care provider. °? Only take cough medicine if you are losing sleep. Understand that cough medicine can prevent your body’s natural ability to remove mucus from your lungs. °? If you were prescribed an antibiotic medicine, take it as told by your health care provider. Do not stop taking the antibiotic even if you start to feel better. °· Sleep in a semi-upright position at night. Try sleeping in a reclining chair, or place a few pillows under your head. °· Do not use tobacco products, including cigarettes, chewing   tobacco, and e-cigarettes. If you need help quitting, ask your health care provider. °· Drink enough water to keep your urine  clear or pale yellow. This will help to thin out mucus secretions in your lungs. °How is this prevented? °There are ways that you can decrease your risk of developing community-acquired pneumonia. Consider getting a pneumococcal vaccine if: °· You are older than 80 years of age. °· You are older than 80 years of age and are undergoing cancer treatment, have chronic lung disease, or have other medical conditions that affect your immune system. Ask your health care provider if this applies to you. ° °There are different types and schedules of pneumococcal vaccines. Ask your health care provider which vaccination option is best for you. °You may also prevent community-acquired pneumonia if you take these actions: °· Get an influenza vaccine every year. Ask your health care provider which type of influenza vaccine is best for you. °· Go to the dentist on a regular basis. °· Wash your hands often. Use hand sanitizer if soap and water are not available. ° °Contact a health care provider if: °· You have a fever. °· You are losing sleep because you cannot control your cough with cough medicine. °Get help right away if: °· You have worsening shortness of breath. °· You have increased chest pain. °· Your sickness becomes worse, especially if you are an older adult or have a weakened immune system. °· You cough up blood. °This information is not intended to replace advice given to you by your health care provider. Make sure you discuss any questions you have with your health care provider. °Document Released: 09/21/2005 Document Revised: 01/30/2016 Document Reviewed: 01/16/2015 °Elsevier Interactive Patient Education © 2017 [employer] ° °

## 2016-09-11 NOTE — Progress Notes (Signed)
Pt IV removed, tolerated well.  Discharge instructions and prescriptions given to pt.  Pt discharged with belongings to home.

## 2016-09-11 NOTE — Care Management Important Message (Signed)
Important Message  Patient Details  Name: Kelsey Sandoval MRN: [id number] Date of Birth: [birth year]   Medicare Important Message Given:  Yes    Sherald Barge, RN 09/11/2016, 10:46 AM

## 2016-09-11 NOTE — Discharge Summary (Signed)
Physician Discharge Summary  Kelsey Sandoval LXB:[id number] DOB: 1933-06-15 DOA: 09/07/2016  PCP: Sallee Lange, MD  Admit date: 09/07/2016 Discharge date: 09/11/2016  Admitted From: Home Disposition:  Home  Recommendations for Outpatient Follow-up:  1. Follow up with PCP in 1-2 weeks 2. Patient will complete total 7 days of antibiotics on 12/11. She will complete remaining 4 days of Diflucan on 12/12 and will be discharged on 5 more days of oral prednisone. 3. Please obtain repeat chest x-ray in 3-4 weeks to ensure resolution.  Home Health: None Equipment/Devices: None  Discharge Condition: Fair CODE STATUS: DO NOT RESUSCITATE Diet recommendation: Heart healthy   Discharge Diagnoses:  Principal Problem:   Lobar pneumonia, unspecified organism ([facility])   Active Problems:   Paroxysmal atrial fibrillation (HCC)   Systemic hypertension   Hypothyroidism   Chronic pain syndrome   Chronic anticoagulation   Chronic kidney disease (CKD), stage III (moderate)   Acute bronchitis Pacemaker status  Membranous nephropathy  Brief Narrative/history of present illness Please refer to admission H&P for details, in brief, 82 year old female with history of hypertension, A. fib on Pradaxa, chronic pain, membranous nephropathy, hyperlipidemia presented with cough with shortness of breath and wheezing. Symptoms have been ongoing for almost a month. She has had 3 visits to her doctor and 1 visit to the ED. She reported feeling tired all the time with intermittent diaphoresis. Reportedly was treated twice with antibiotics for pneumonia. Patient again found to have new left upper lobe infiltrate. Admitted for lobar pneumonia.  Hospital course Recurrent lobar pneumonia. Failed outpatient treatment 2. Placed on vancomycin and asked her in am on admission, transition to cefepime. Blood cultures negative. Sputum culture showing abundant yeast, moderate enterococcus. Urine strep antigen negative. -Patient  remains afebrile and symptoms much improved. We'll transition to oral Keflex and doxycycline (patient denies allergies to penicillin, quinolone and Bactrim). Added antitussives. -Complete total 7 days of antibiotic course.  -Added seven-day course of Diflucan given abundant yeast on sputum culture.  -Please obtain chest x-ray in 3-4 weeks to ensure resolution. The patient has recurrent symptoms she will need chest CT to rule out obstruction or lung mass.  Acute bronchitis Received nebs and systemic steroid. Symptoms better. Will discharge on 5 more days of oral prednisone. Instructed to use her home inhaler.  Paroxysmal A. fib Rate controlled. Continue Pradaxa and beta blocker. Cardiology for PPM and was normally functioning.  Essential hypertension Stable. Resume home medications  Chronic kidney disease stage III with membranous nephropathy Renal function at baseline. Follow-up as outpatient.  Chronic pain syndrome Continue home medications. Next  Chronic fatigue Reports to be progressive over the past few months. TSH is normal. Patient informs being started on Cymbalta and BuSpar at home and informed that it did not help her much. Please address this during outpatient follow-up and taper if necessary.  Stable to be discharged home with outpatient follow-up.   family communication: None at bedside  Consults: Cardiology  Disposition: Home      Discharge Instructions     Medication List    TAKE these medications   acetaminophen 650 MG CR tablet Commonly known as:  TYLENOL Take 1,300 mg by mouth every 8 (eight) hours as needed for pain.   albuterol 108 (90 Base) MCG/ACT inhaler Commonly known as:  PROVENTIL HFA;VENTOLIN HFA Inhale 2 puffs into the lungs every 6 (six) hours as needed for wheezing or shortness of breath.   ALPRAZolam 1 MG tablet Commonly known as:  XANAX 1 qhs prn insomnia  amLODipine 10 MG tablet Commonly known as:  NORVASC Take 1 tablet (10 mg  total) by mouth daily.   busPIRone 10 MG tablet Commonly known as:  BUSPAR Take 1 tablet (10 mg total) by mouth 3 (three) times daily.   cephALEXin 500 MG capsule Commonly known as:  KEFLEX Take 1 capsule (500 mg total) by mouth 2 (two) times daily.   dabigatran 150 MG Caps capsule Commonly known as:  PRADAXA Take 1 capsule (150 mg total) by mouth 2 (two) times daily.   doxycycline 100 MG tablet Commonly known as:  VIBRA-TABS Take 1 tablet (100 mg total) by mouth 2 (two) times daily.   DULoxetine 60 MG capsule Commonly known as:  CYMBALTA Take 1 capsule (60 mg total) by mouth 2 (two) times daily.   flecainide 50 MG tablet Commonly known as:  TAMBOCOR Take 1 tablet (50 mg total) by mouth 2 (two) times daily.   fluconazole 100 MG tablet Commonly known as:  DIFLUCAN Take 1 tablet (100 mg total) by mouth daily. Start taking on:  09/12/2016   gabapentin 300 MG capsule Commonly known as:  NEURONTIN TAKE ONE CAPSULE BY MOUTH THREE TIMES DAILY **NEW DOSING**   guaiFENesin 600 MG 12 hr tablet Commonly known as:  MUCINEX Take 2 tablets (1,200 mg total) by mouth 2 (two) times daily.   HYDROcodone-acetaminophen 5-325 MG tablet Commonly known as:  NORCO/VICODIN Take 1 tablet by mouth every 4 (four) hours as needed.   levothyroxine 50 MCG tablet Commonly known as:  SYNTHROID, LEVOTHROID TAKE ONE TABLET BY MOUTH ONCE DAILY **CHANGE  IN  DOSE**   lisinopril 40 MG tablet Commonly known as:  PRINIVIL,ZESTRIL Take 1 tablet (40 mg total) by mouth daily.   metoprolol succinate 50 MG 24 hr tablet Commonly known as:  TOPROL-XL TAKE ONE TABLET BY MOUTH ONCE DAILY WITH  OR  IMMEDIATELY  FOLLOWING  A  MEAL   ondansetron 8 MG disintegrating tablet Commonly known as:  ZOFRAN-ODT Take 1 tablet (8 mg total) by mouth every 8 (eight) hours as needed for nausea or vomiting.   pantoprazole 40 MG tablet Commonly known as:  PROTONIX Take 1 tablet (40 mg total) by mouth daily. Reported on  01/28/2016   pravastatin 80 MG tablet Commonly known as:  PRAVACHOL Take 1 tablet (80 mg total) by mouth daily.   predniSONE 20 MG tablet Commonly known as:  DELTASONE Take 2 tablets (40 mg total) by mouth daily with breakfast.   riTUXimab in sodium chloride 0.9 % 250 mL Infusion 2 weeks apart: May repeat in 6 months. Follow up with primary care doctor prior to initiation of rituximab for infectious clearance   vitamin B-12 1000 MCG tablet Commonly known as:  CYANOCOBALAMIN Take 1 tablet (1,000 mcg total) by mouth daily.       Allergies  Allergen Reactions  . Penicillins Other (See Comments)    Caused patient to pass out.Can take cephalosporins Has patient had a PCN reaction causing immediate rash, facial/tongue/throat swelling, SOB or lightheadedness with hypotension: no Has patient had a PCN reaction causing severe rash involving mucus membranes or skin necrosis: No Has patient had a PCN reaction that required hospitalization No Has patient had a PCN reaction occurring within the last 10 years: No If all of the above answers are "NO", then may proceed with Cephalosporin use.   . Levaquin [Levofloxacin] Nausea Only  . Sulfa Antibiotics Other (See Comments)    Unknown  . Zithromax [Azithromycin] Nausea Only and Rash  Procedures/Studies: Dg Chest 2 View  Result Date: 09/07/2016 CLINICAL DATA:  Lung infection.  Cough. EXAM: CHEST  2 VIEW COMPARISON:  08/06/2016. FINDINGS: Cardiac pacer with lead tips in the right atrium and right ventricle. Cardiomegaly with normal pulmonary vascularity. New infiltrate in the left upper lobe consistent pneumonia. No pleural effusion pneumothorax. Diffuse thoracic spine osteopenia degenerative change. Mild mid thoracic spine stable compression fracture. IMPRESSION: New infiltrate in the left upper lobe consistent with pneumonia. Follow-up chest x-rays to demonstrate clearing suggested. Electronically Signed   By: Marcello Moores  Register   On:  09/07/2016 10:55     2-D echo Study Conclusions  - Left ventricle: The cavity size was normal. Wall thickness was   increased in a pattern of moderate LVH. Systolic function was   normal. The estimated ejection fraction was in the range of 55%   to 60%. Wall motion was normal; there were no regional wall   motion abnormalities. The study is not technically sufficient to   allow evaluation of LV diastolic function. - Aortic valve: Mildly calcified annulus. Trileaflet. - Mitral valve: Calcified annulus. There was mild regurgitation. - Left atrium: The atrium was moderately to severely dilated. - Right ventricle: Pacer wire or catheter noted in right ventricle. - Right atrium: The atrium was mildly dilated. Pacer wire or   catheter noted in right atrium. Central venous pressure (est): 8   mm Hg. - Atrial septum: The septum bowed from left to right, consistent   with increased left atrial pressure. No defect or patent foramen   ovale was identified. - Tricuspid valve: There was mild regurgitation. - Pulmonary arteries: PA peak pressure: 40 mm Hg (S). - Pericardium, extracardiac: There was no pericardial effusion.  Impressions:  - Moderate LVH with LVEF 55-60%. Indeterminate diastolic function.   Moderate to severe left atrial enlargement. Calcified mitral   annulus with mild mitral regurgitation. Device wire noted within   the right heart. Mildly dilated right atrium. Mild tricuspid   regurgitation with PASP 40 mmHg.  Subjective: informs are breathing and cough to be better.  Discharge Exam: Vitals:   09/11/16 0630 09/11/16 0940  BP: (!) 147/92 (!) 166/93  Pulse: 92 89  Resp: 17 18  Temp: 97.5 F (36.4 C) 98.6 F (37 C)   Vitals:   09/10/16 2230 09/11/16 0630 09/11/16 0731 09/11/16 0940  BP: (!) 145/81 (!) 147/92  (!) 166/93  Pulse: 94 92  89  Resp: 20 17  18   Temp: 98.4 F (36.9 C) 97.5 F (36.4 C)  98.6 F (37 C)  TempSrc: Oral Oral  Oral  SpO2: 95% 95% 100%  97%  Weight:      Height:        General: Elderly female not in distress HEENT: Moist mucosa, supple neck Cardiovascular: S1 and S2 irregular, no murmurs Respiratory: Clear to auscultation bilaterally, no added sounds Abdominal: Soft, NT, ND, BS+ Extremities: Warm, no edema    The results of significant diagnostics from this hospitalization (including imaging, microbiology, ancillary and laboratory) are listed below for reference.     Microbiology: Recent Results (from the past 240 hour(s))  Culture, sputum-assessment     Status: None   Collection Time: 09/07/16  5:59 PM  Result Value Ref Range Status   Specimen Description EXPECTORATED SPUTUM  Final   Special Requests Normal  Final   Sputum evaluation   Final    THIS SPECIMEN IS ACCEPTABLE. RESPIRATORY CULTURE REPORT TO FOLLOW. PERFORMED AT [facility]    Report Status  09/08/2016 FINAL  Final  Culture, blood (routine x 2) Call MD if unable to obtain prior to antibiotics being given     Status: None (Preliminary result)   Collection Time: 09/07/16  8:10 PM  Result Value Ref Range Status   Specimen Description RIGHT ANTECUBITAL  Final   Special Requests BOTTLES DRAWN AEROBIC AND ANAEROBIC 6CC  Final   Culture NO GROWTH 4 DAYS  Final   Report Status PENDING  Incomplete  Culture, blood (routine x 2) Call MD if unable to obtain prior to antibiotics being given     Status: None (Preliminary result)   Collection Time: 09/07/16  8:22 PM  Result Value Ref Range Status   Specimen Description BLOOD LEFT HAND  Final   Special Requests BOTTLES DRAWN AEROBIC AND ANAEROBIC 6CC  Final   Culture NO GROWTH 4 DAYS  Final   Report Status PENDING  Incomplete  Culture, respiratory (NON-Expectorated)     Status: None (Preliminary result)   Collection Time: 09/08/16  7:10 PM  Result Value Ref Range Status   Specimen Description SPUTUM  Final   Special Requests NONE  Final   Gram Stain   Final    FEW WBC PRESENT,BOTH PMN AND MONONUCLEAR MODERATE  GRAM POSITIVE COCCI IN PAIRS FEW GRAM POSITIVE RODS RARE GRAM NEGATIVE RODS ABUNDANT YEAST    Culture   Final    ABUNDANT CANDIDA TROPICALIS MODERATE CANDIDA ALBICANS MODERATE ENTEROCOCCUS FAECALIS SUSCEPTIBILITIES TO FOLLOW Performed at [facility]    Report Status PENDING  Incomplete     Labs: BNP (last 3 results)  Recent Labs  08/06/16 1500 09/07/16 1026  BNP 646.0* 161.0*   Basic Metabolic Panel:  Recent Labs Lab 09/07/16 1022 09/08/16 0214 09/09/16 0552 09/11/16 0513  NA 139 139 138 140  K 4.0 4.4 4.4 3.9  CL 105 110 111 110  CO2 26 22 21* 23  GLUCOSE 106* 105* 201* 133*  BUN 22* 21* 22* 30*  CREATININE 1.49* 1.46* 1.24* 1.15*  CALCIUM 8.8* 8.6* 8.7* 9.0   Liver Function Tests: No results for input(s): AST, ALT, ALKPHOS, BILITOT, PROT, ALBUMIN in the last 168 hours. No results for input(s): LIPASE, AMYLASE in the last 168 hours. No results for input(s): AMMONIA in the last 168 hours. CBC:  Recent Labs Lab 09/07/16 1022 09/08/16 0214  WBC 9.4 9.3  NEUTROABS 6.4 4.4  HGB 13.3 13.8  HCT 41.3 42.5  MCV 94.1 94.7  PLT 173 178   Cardiac Enzymes:  Recent Labs Lab 09/07/16 1026  TROPONINI <0.03   BNP: Invalid input(s): POCBNP CBG: No results for input(s): GLUCAP in the last 168 hours. D-Dimer No results for input(s): DDIMER in the last 72 hours. Hgb A1c No results for input(s): HGBA1C in the last 72 hours. Lipid Profile No results for input(s): CHOL, HDL, LDLCALC, TRIG, CHOLHDL, LDLDIRECT in the last 72 hours. Thyroid function studies No results for input(s): TSH, T4TOTAL, T3FREE, THYROIDAB in the last 72 hours.  Invalid input(s): FREET3 Anemia work up No results for input(s): VITAMINB12, FOLATE, FERRITIN, TIBC, IRON, RETICCTPCT in the last 72 hours. Urinalysis    Component Value Date/Time   COLORURINE YELLOW 08/06/2016 1626   APPEARANCEUR CLEAR 08/06/2016 1626   LABSPEC 1.015 08/06/2016 1626   PHURINE 6.0 08/06/2016 1626    GLUCOSEU NEGATIVE 08/06/2016 1626   HGBUR TRACE (A) 08/06/2016 1626   BILIRUBINUR NEGATIVE 08/06/2016 1626   BILIRUBINUR 3+ 01/14/2016 1036   KETONESUR NEGATIVE 08/06/2016 1626   PROTEINUR >300 (A) 08/06/2016  1626   UROBILINOGEN negative 01/14/2016 1036   UROBILINOGEN 0.2 03/07/2015 2005   NITRITE NEGATIVE 08/06/2016 1626   LEUKOCYTESUR NEGATIVE 08/06/2016 1626   Sepsis Labs Invalid input(s): PROCALCITONIN,  WBC,  LACTICIDVEN Microbiology Recent Results (from the past 240 hour(s))  Culture, sputum-assessment     Status: None   Collection Time: 09/07/16  5:59 PM  Result Value Ref Range Status   Specimen Description EXPECTORATED SPUTUM  Final   Special Requests Normal  Final   Sputum evaluation   Final    THIS SPECIMEN IS ACCEPTABLE. RESPIRATORY CULTURE REPORT TO FOLLOW. PERFORMED AT [facility]    Report Status 09/08/2016 FINAL  Final  Culture, blood (routine x 2) Call MD if unable to obtain prior to antibiotics being given     Status: None (Preliminary result)   Collection Time: 09/07/16  8:10 PM  Result Value Ref Range Status   Specimen Description RIGHT ANTECUBITAL  Final   Special Requests BOTTLES DRAWN AEROBIC AND ANAEROBIC 6CC  Final   Culture NO GROWTH 4 DAYS  Final   Report Status PENDING  Incomplete  Culture, blood (routine x 2) Call MD if unable to obtain prior to antibiotics being given     Status: None (Preliminary result)   Collection Time: 09/07/16  8:22 PM  Result Value Ref Range Status   Specimen Description BLOOD LEFT HAND  Final   Special Requests BOTTLES DRAWN AEROBIC AND ANAEROBIC 6CC  Final   Culture NO GROWTH 4 DAYS  Final   Report Status PENDING  Incomplete  Culture, respiratory (NON-Expectorated)     Status: None (Preliminary result)   Collection Time: 09/08/16  7:10 PM  Result Value Ref Range Status   Specimen Description SPUTUM  Final   Special Requests NONE  Final   Gram Stain   Final    FEW WBC PRESENT,BOTH PMN AND MONONUCLEAR MODERATE GRAM POSITIVE  COCCI IN PAIRS FEW GRAM POSITIVE RODS RARE GRAM NEGATIVE RODS ABUNDANT YEAST    Culture   Final    ABUNDANT CANDIDA TROPICALIS MODERATE CANDIDA ALBICANS MODERATE ENTEROCOCCUS FAECALIS SUSCEPTIBILITIES TO FOLLOW Performed at [facility]    Report Status PENDING  Incomplete     Time coordinating discharge: Over 30 minutes  SIGNED:   Louellen Molder, MD  [facility] 09/11/2016, 10:13 AM Pager   If 7PM-7AM, please contact night-coverage [email or web address] Password [email or web address]

## 2016-09-11 NOTE — Care Management Note (Signed)
Case Management Note  Patient Details  Name: Kelsey Sandoval MRN: [id number] Date of Birth: Mar 23, 1933  Expected Discharge Date:       09/11/2016           Expected Discharge Plan:  Home/Self Care  In-House Referral:  NA  Discharge planning Services  CM Consult  Post Acute Care Choice:  NA Choice offered to:  NA  Status of Service:  Completed, signed off  Additional Comments: Pt discharging home today. No CM needs.   Sherald Barge, RN 09/11/2016, 10:48 AM

## 2016-09-12 LAB — CULTURE, BLOOD (ROUTINE X 2)
CULTURE: NO GROWTH
CULTURE: NO GROWTH

## 2016-09-16 ENCOUNTER — Telehealth: Payer: Self-pay | Admitting: Family Medicine

## 2016-09-16 NOTE — Telephone Encounter (Signed)
Pt.notified

## 2016-09-16 NOTE — Telephone Encounter (Signed)
Patient has a follow-up appointment on Thursday tell the patient to bring all of her medicines please

## 2016-09-17 ENCOUNTER — Ambulatory Visit (INDEPENDENT_AMBULATORY_CARE_PROVIDER_SITE_OTHER): Payer: Medicare Other | Admitting: Family Medicine

## 2016-09-17 VITALS — BP 132/86 | Temp 98.1°F | Ht 69.0 in | Wt 170.0 lb

## 2016-09-17 DIAGNOSIS — J45909 Unspecified asthma, uncomplicated: Secondary | ICD-10-CM

## 2016-09-17 DIAGNOSIS — J181 Lobar pneumonia, unspecified organism: Secondary | ICD-10-CM | POA: Diagnosis not present

## 2016-09-17 DIAGNOSIS — N049 Nephrotic syndrome with unspecified morphologic changes: Secondary | ICD-10-CM

## 2016-09-17 DIAGNOSIS — J189 Pneumonia, unspecified organism: Secondary | ICD-10-CM

## 2016-09-17 DIAGNOSIS — I480 Paroxysmal atrial fibrillation: Secondary | ICD-10-CM

## 2016-09-17 MED ORDER — AMLODIPINE BESYLATE 5 MG PO TABS
5.0000 mg | ORAL_TABLET | Freq: Every day | ORAL | 5 refills | Status: DC
Start: 2016-09-17 — End: 2017-01-04

## 2016-09-17 MED ORDER — METOPROLOL SUCCINATE ER 25 MG PO TB24: ORAL_TABLET | ORAL | 5 refills | Status: DC

## 2016-09-17 NOTE — Progress Notes (Signed)
   Subjective:    Patient ID: Kelsey Sandoval, female    DOB: 05-11-1933, 82 y.o.   MRN: [id number]  HPI    Review of Systems     Objective:   Physical Exam        Assessment & Plan:

## 2016-09-17 NOTE — Progress Notes (Signed)
   Subjective:    Patient ID: Kelsey Sandoval, female    DOB: 04-Oct-1933, 82 y.o.   MRN: [id number]  HPIHospitalization follow up for pneumonia. Pt states she is weak and shortness of breath. O2 97.   This patient was recently in the hospital for pneumonia. She had progressive respiratory symptoms congestion cough fever not feeling good. She was treated and discharged on the eighth. She brings all of her medicines in. Yesterday we attempted to call her to talk with her. She comes in today for evaluation. She denies any chest pressure tightness or pain but she does relate a lot of weakness low appetite not feeling well denies high fever chills sweats. Hospitalization was reviewed with her and her son as well as the results of the x-ray and blood work. Patient has chronic insomnia uses Xanax at night to help with that She also has significant problem with neuropathy in the leg as she uses her medication for She does take her thyroid medicine on a regular basis. She does take heart related blood pressure medication she does relate some dizziness when she stands up She denies abusing her pain medicine uses no more than 5 a day but some days she only takes 1 or 2 or less  Review of Systems Currently she is not have any chest pain she does relate shortness breath when she pushes herself she denies sweats chills vomiting diarrhea. Denies high fevers or headaches    Objective:   Physical Exam Lungs are clear heart is regular pulse normal extremities no edema skin warm dry blood pressure checked sitting and standing shows significant drop       Assessment & Plan:  Pneumonia-no further antibiotics necessary I believe she is over this but she needs a follow-up chest x-ray in early January Renal insufficiency history nephrotic syndrome check metabolic 7 and started January Hypertension good control but were entered reduce the medication because of orthostasis follow-up again in 3-4 weeks Depression  follows with psychiatry Transitional care was completed.  We will do pneumococcal 23 on next visit

## 2016-10-01 ENCOUNTER — Other Ambulatory Visit: Payer: Self-pay | Admitting: Family Medicine

## 2016-10-02 ENCOUNTER — Telehealth (HOSPITAL_COMMUNITY): Payer: Self-pay | Admitting: *Deleted

## 2016-10-02 NOTE — Telephone Encounter (Signed)
Pt was scheduled to f/u with provider on Oct 06, 2016 and provider will not be in office. Pt will be rescheduled for providers next available. Pt is in need for refills for her Buspar 10 mg 07-23-2016 90 tabs 2 refills, Cymbalta 60 mg 2 refills. Pt Pharmacy is [facility] [phone].

## 2016-10-03 NOTE — Telephone Encounter (Signed)
Too soon to reorder

## 2016-10-05 NOTE — Telephone Encounter (Signed)
Renew medications times four each

## 2016-10-06 ENCOUNTER — Telehealth: Payer: Self-pay | Admitting: Family Medicine

## 2016-10-06 ENCOUNTER — Ambulatory Visit (HOSPITAL_COMMUNITY): Payer: Self-pay | Admitting: Psychiatry

## 2016-10-06 MED ORDER — ONDANSETRON 8 MG PO TBDP: 8.0000 mg | ORAL_TABLET | Freq: Three times a day (TID) | ORAL | 5 refills | Status: DC | PRN

## 2016-10-06 MED ORDER — LISINOPRIL 40 MG PO TABS: 40.0000 mg | ORAL_TABLET | Freq: Every day | ORAL | 5 refills | Status: DC

## 2016-10-06 MED ORDER — PRAVASTATIN SODIUM 80 MG PO TABS: 80.0000 mg | ORAL_TABLET | Freq: Every day | ORAL | 5 refills | Status: DC

## 2016-10-06 NOTE — Telephone Encounter (Signed)
May have 6 months refill on these meds

## 2016-10-06 NOTE — Telephone Encounter (Signed)
noted 

## 2016-10-06 NOTE — Telephone Encounter (Signed)
Notified patient that meds were sent to pharmacy.

## 2016-10-06 NOTE — Telephone Encounter (Signed)
Pt is needing refills on the following medications: pravastatin (PRAVACHOL) 80 MG tablet ondansetron (ZOFRAN-ODT) 8 MG disintegrating tablet lisinopril (PRINIVIL,ZESTRIL) 40 MG tablet  [facility] 

## 2016-10-12 ENCOUNTER — Other Ambulatory Visit: Payer: Self-pay | Admitting: Family Medicine

## 2016-10-12 ENCOUNTER — Other Ambulatory Visit (HOSPITAL_COMMUNITY): Payer: Self-pay | Admitting: Psychiatry

## 2016-10-12 IMAGING — CT CT HEAD W/O CM
1 series · 16 of 30 positions shown, 20 images · non-contrast
Comparison: None.

CLINICAL DATA: 81-year-old hypertensive female with intractable
nausea and vomiting for the past year. Dizziness today. Initial
encounter.

EXAM:
CT HEAD WITHOUT CONTRAST
TECHNIQUE: Contiguous axial images were obtained from the base of the skull
through the vertex without intravenous contrast.

[Series 2: headseq 4.8 h37s · axial · 0.43mm/px · z∈[+96,+227]mm · 16 of 30 slices shown, 20 images]
[im 2/30  brain]
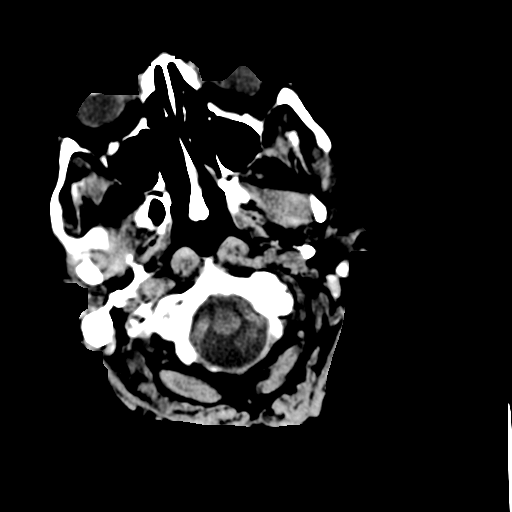
[im 2/30  bone]
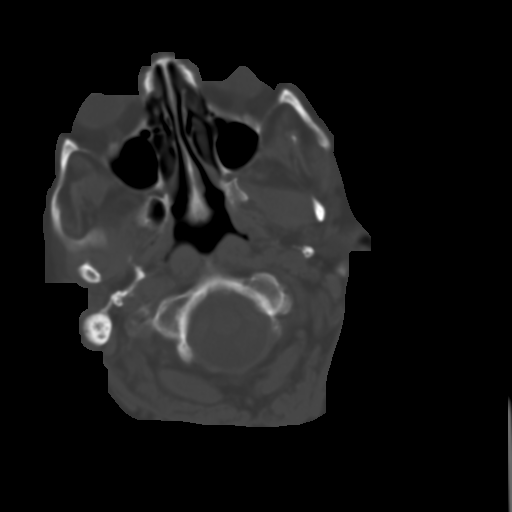
[im 4/30  brain]
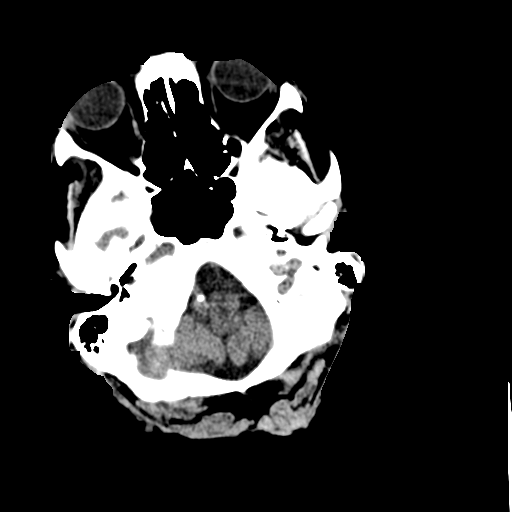
[im 6/30  brain]
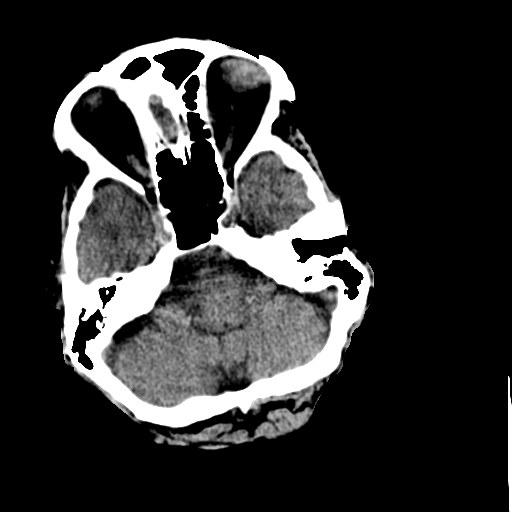
[im 8/30  brain]
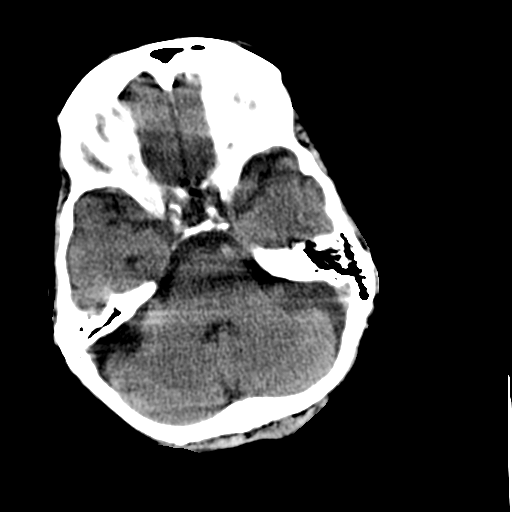
[im 9/30  brain]
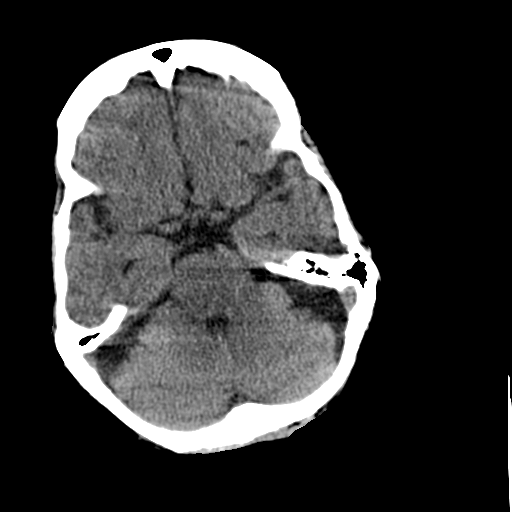
[im 9/30  bone]
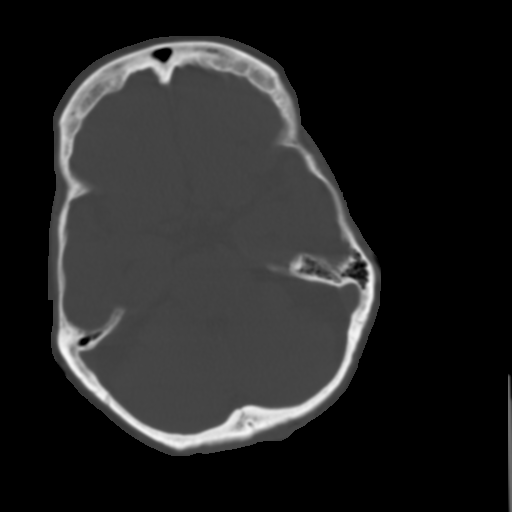
[im 11/30  brain]
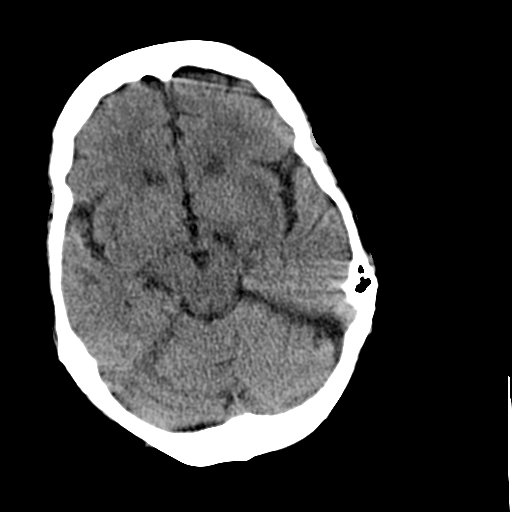
[im 13/30  brain]
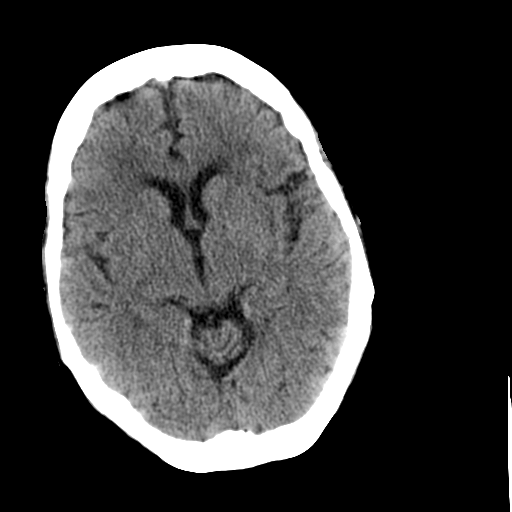
[im 15/30  brain]
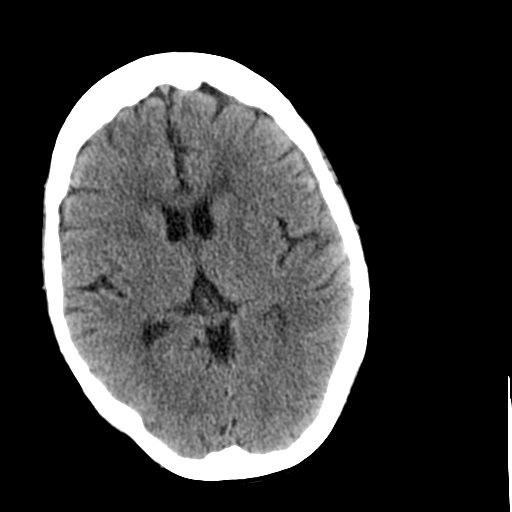
[im 16/30  brain]
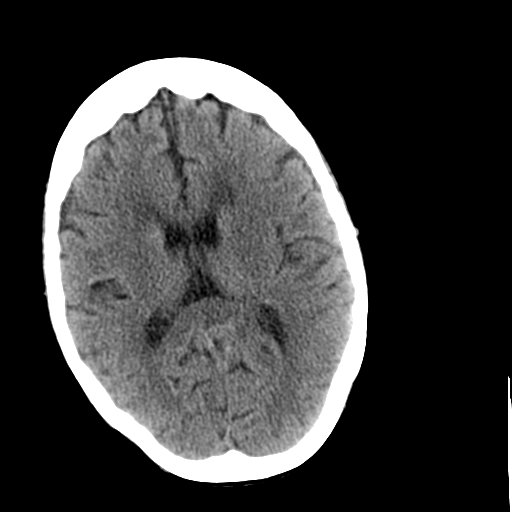
[im 16/30  bone]
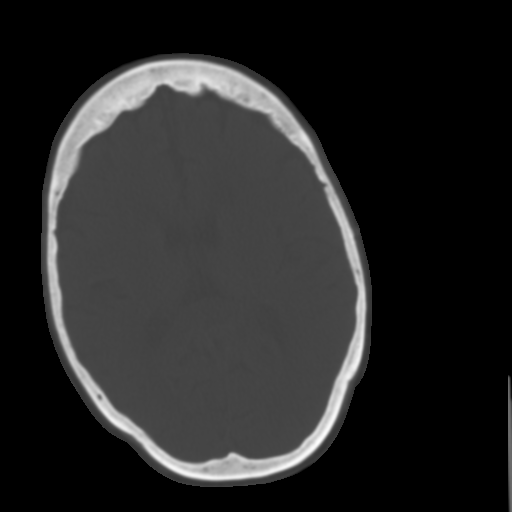
[im 18/30  brain]
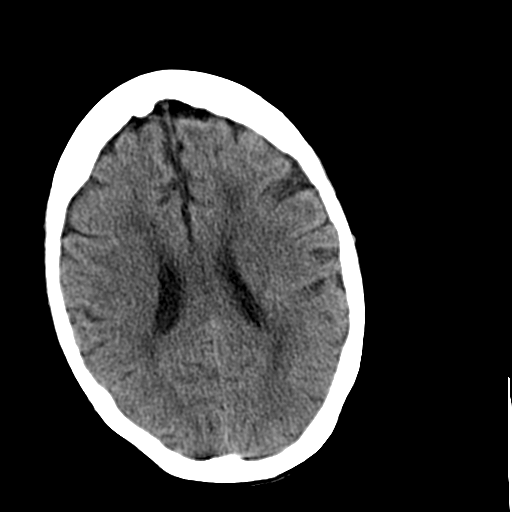
[im 20/30  brain]
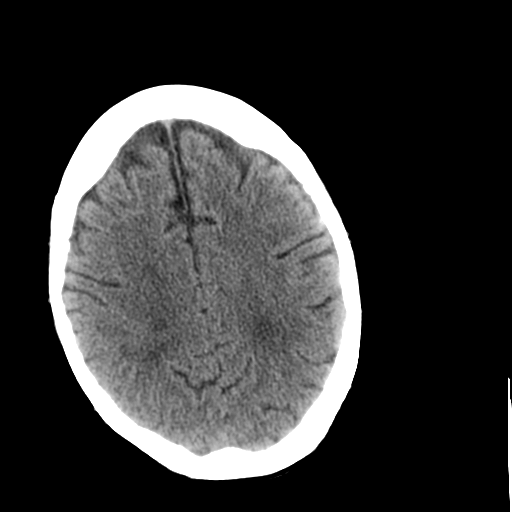
[im 22/30  brain]
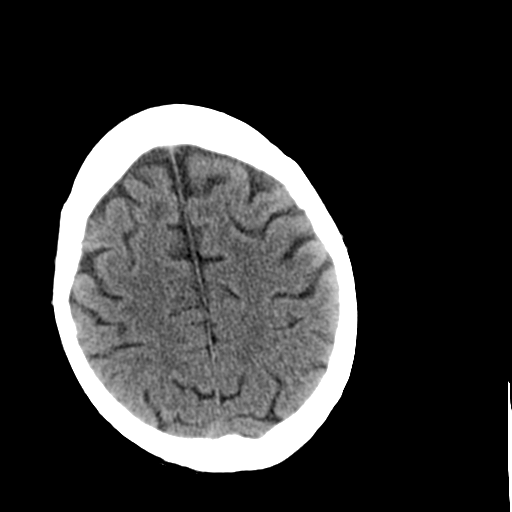
[im 23/30  brain]
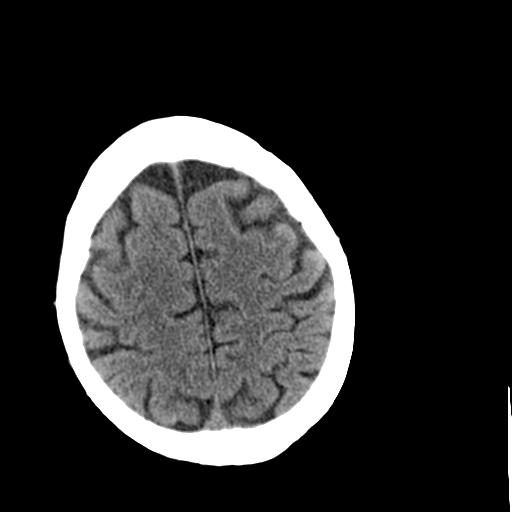
[im 23/30  bone]
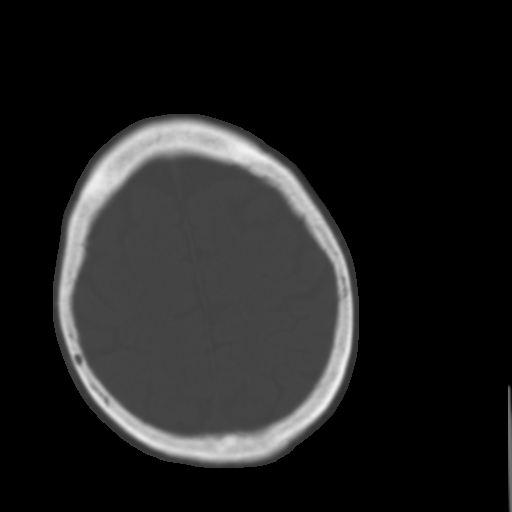
[im 25/30  brain]
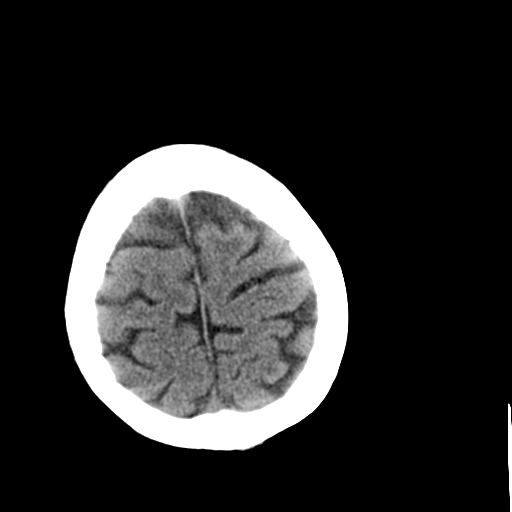
[im 27/30  brain]
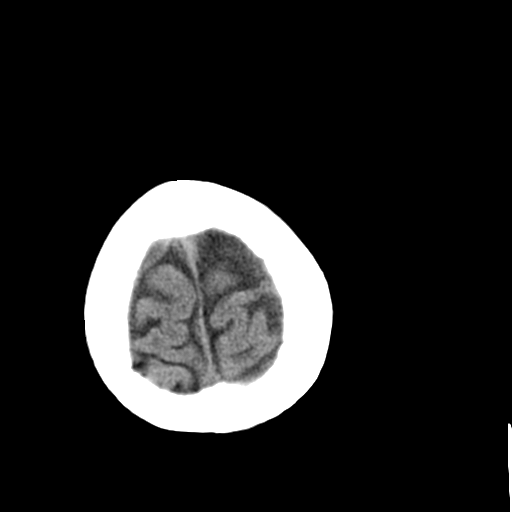
[im 29/30  brain]
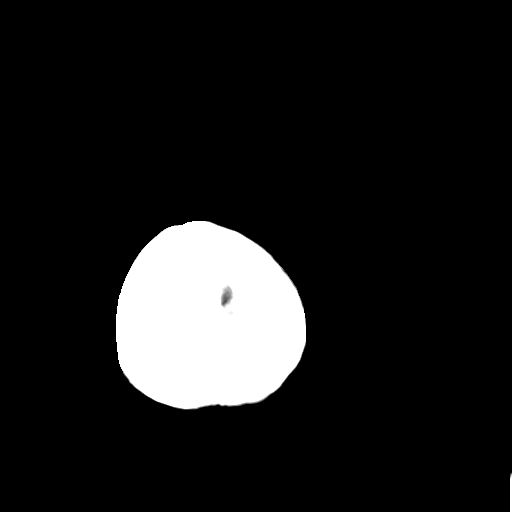

[16 of 30 positions shown; findings below may reference images not displayed]

FINDINGS: Exam is motion degraded.

No intracranial hemorrhage.

Small vessel disease type changes without CT evidence of large acute
infarct.

Global atrophy without hydrocephalus.

No intracranial mass lesion noted on this unenhanced exam.

Vascular calcifications.

Mastoid air cells, middle ear cavities and visualized paranasal
sinuses are clear.
IMPRESSION: No intracranial hemorrhage.

Small vessel disease type changes without CT evidence of large acute
infarct.

## 2016-10-13 ENCOUNTER — Other Ambulatory Visit: Payer: Self-pay | Admitting: Family Medicine

## 2016-10-13 ENCOUNTER — Ambulatory Visit (HOSPITAL_COMMUNITY)
Admission: RE | Admit: 2016-10-13 | Discharge: 2016-10-13 | Disposition: A | Discharge status: Home / Self Care | Payer: PPO | Source: Ambulatory Visit | Attending: Family Medicine | Admitting: Family Medicine

## 2016-10-13 DIAGNOSIS — J181 Lobar pneumonia, unspecified organism: Secondary | ICD-10-CM | POA: Diagnosis not present

## 2016-10-13 DIAGNOSIS — I480 Paroxysmal atrial fibrillation: Secondary | ICD-10-CM | POA: Diagnosis not present

## 2016-10-13 DIAGNOSIS — J45909 Unspecified asthma, uncomplicated: Secondary | ICD-10-CM | POA: Diagnosis not present

## 2016-10-13 DIAGNOSIS — I7090 Unspecified atherosclerosis: Secondary | ICD-10-CM | POA: Insufficient documentation

## 2016-10-13 DIAGNOSIS — J189 Pneumonia, unspecified organism: Secondary | ICD-10-CM | POA: Diagnosis not present

## 2016-10-13 DIAGNOSIS — N049 Nephrotic syndrome with unspecified morphologic changes: Secondary | ICD-10-CM | POA: Diagnosis not present

## 2016-10-13 LAB — BASIC METABOLIC PANEL
BUN: 21 mg/dL (ref 7–25)
CALCIUM: 8.7 mg/dL (ref 8.6–10.4)
CHLORIDE: 107 mmol/L (ref 98–110)
CO2: 24 mmol/L (ref 20–31)
CREATININE: 1.18 mg/dL — AB (ref 0.60–0.88)
Glucose, Bld: 122 mg/dL — ABNORMAL HIGH (ref 65–99)
Potassium: 4.1 mmol/L (ref 3.5–5.3)
Sodium: 142 mmol/L (ref 135–146)

## 2016-10-14 ENCOUNTER — Ambulatory Visit: Payer: Self-pay | Admitting: Orthopaedic Surgery

## 2016-10-21 ENCOUNTER — Ambulatory Visit: Payer: Self-pay | Admitting: Orthopaedic Surgery

## 2016-10-22 ENCOUNTER — Ambulatory Visit: Payer: Medicare Other | Admitting: Family Medicine

## 2016-10-27 ENCOUNTER — Encounter (HOSPITAL_COMMUNITY): Payer: Self-pay | Admitting: Psychiatry

## 2016-10-27 ENCOUNTER — Ambulatory Visit (INDEPENDENT_AMBULATORY_CARE_PROVIDER_SITE_OTHER): Payer: PPO | Admitting: Psychiatry

## 2016-10-27 VITALS — BP 156/96 | HR 100 | Ht 69.0 in | Wt 172.6 lb

## 2016-10-27 DIAGNOSIS — Z880 Allergy status to penicillin: Secondary | ICD-10-CM

## 2016-10-27 DIAGNOSIS — Z79899 Other long term (current) drug therapy: Secondary | ICD-10-CM

## 2016-10-27 DIAGNOSIS — Z818 Family history of other mental and behavioral disorders: Secondary | ICD-10-CM

## 2016-10-27 DIAGNOSIS — F322 Major depressive disorder, single episode, severe without psychotic features: Secondary | ICD-10-CM

## 2016-10-27 DIAGNOSIS — Z9889 Other specified postprocedural states: Secondary | ICD-10-CM

## 2016-10-27 DIAGNOSIS — Z882 Allergy status to sulfonamides status: Secondary | ICD-10-CM

## 2016-10-27 DIAGNOSIS — Z800 Family history of malignant neoplasm of digestive organs: Secondary | ICD-10-CM

## 2016-10-27 MED ORDER — BUSPIRONE HCL 10 MG PO TABS: 10.0000 mg | ORAL_TABLET | Freq: Three times a day (TID) | ORAL | 3 refills | Status: DC

## 2016-10-27 MED ORDER — DULOXETINE HCL 60 MG PO CPEP: 60.0000 mg | ORAL_CAPSULE | Freq: Two times a day (BID) | ORAL | 3 refills | Status: DC
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
## 2016-10-28 ENCOUNTER — Telehealth: Payer: Self-pay | Admitting: Orthopaedic Surgery

## 2016-10-28 ENCOUNTER — Ambulatory Visit (INDEPENDENT_AMBULATORY_CARE_PROVIDER_SITE_OTHER): Payer: PPO

## 2016-10-28 ENCOUNTER — Encounter: Payer: Self-pay | Admitting: Orthopaedic Surgery

## 2016-10-28 ENCOUNTER — Ambulatory Visit (INDEPENDENT_AMBULATORY_CARE_PROVIDER_SITE_OTHER): Payer: PPO | Admitting: Orthopaedic Surgery

## 2016-10-28 VITALS — BP 174/98 | HR 89 | Temp 97.2°F | Ht 69.0 in | Wt 172.0 lb

## 2016-10-28 DIAGNOSIS — M5441 Lumbago with sciatica, right side: Secondary | ICD-10-CM

## 2016-10-28 DIAGNOSIS — M25561 Pain in right knee: Secondary | ICD-10-CM

## 2016-10-28 DIAGNOSIS — M25562 Pain in left knee: Secondary | ICD-10-CM

## 2016-10-28 DIAGNOSIS — G8929 Other chronic pain: Secondary | ICD-10-CM

## 2016-10-28 MED ORDER — PREDNISONE 5 MG (21) PO TBPK: ORAL_TABLET | ORAL | 0 refills | Status: DC

## 2016-10-28 NOTE — Telephone Encounter (Signed)
Patient called stating that her pharmacy has not received the prescription you were going to send for her.   Please advise

## 2016-10-28 NOTE — Progress Notes (Signed)
Patient Kelsey Sandoval, female DOB:Apr 11, 1933, 83 y.o. UXL:[id number]  Chief Complaint  Patient presents with  . Follow-up    bilateral knee pain, requests injections  . Back Pain    low back pain with right leg radicular pain    HPI  Kelsey Sandoval is a 83 y.o. female who has chronic pain of both knees.  She has no giving way or locking.  Both knees have swelling.  She has no new trauma.  She has history of lower back pain.  She had injections by Dr. Brien Few about six months ago she says.  They helped but her back pain has gotten worse with right sided sciatica now.  She has pain more after standing and walking. HPI  Body mass index is 25.4 kg/m.  ROS  Review of Systems  HENT: Negative for congestion.   Respiratory: Negative for cough and shortness of breath.   Cardiovascular: Negative for chest pain and leg swelling.  Endocrine: Positive for cold intolerance.  Musculoskeletal: Positive for arthralgias, back pain and gait problem.  Allergic/Immunologic: Positive for environmental allergies.    Past Medical History:  Diagnosis Date  . Anxiety   . Arthritis   . Back pain, chronic    Sciatica  . Cataracts, bilateral   . Depression   . Essential hypertension   . Hyperlipidemia   . Membranous nephropathy determined by biopsy 06/07/2015  . Nephrotic syndrome    RHUX  . Neuropathy ([place])   . Osteopenia   . Pacemaker    Medtronic  . PAF (paroxysmal atrial fibrillation) ([place])   . Pre-diabetes   . Tachycardia-bradycardia syndrome [facility])     Past Surgical History:  Procedure Laterality Date  . ABDOMINAL HYSTERECTOMY     partial-pt has no ovaries  . APPENDECTOMY    . BACK SURGERY    . BIOPSY N/A 01/17/2014   Procedure: BIOPSY;  Surgeon: Rogene Houston, MD;  [facility];  Service: Endoscopy;  Laterality: N/A;  . CATARACT EXTRACTION W/PHACO Left 02/28/2015   Procedure: CATARACT EXTRACTION PHACO AND INTRAOCULAR LENS PLACEMENT (IOC);  Surgeon: [name], MD;   Location: [facility] OR[facility];  Service: Ophthalmology;  Laterality: Left;  CDE 18.71  . COLONOSCOPY  9/05  . COLONOSCOPY N/A 10/11/2013   Procedure: COLONOSCOPY;  Surgeon: Rogene Houston, MD;  [facility];  Service: Endoscopy;  Laterality: N/A;  1200  . ESOPHAGOGASTRODUODENOSCOPY N/A 01/17/2014   Procedure: ESOPHAGOGASTRODUODENOSCOPY (EGD);  Surgeon: Rogene Houston, MD;  [facility];  Service: Endoscopy;  Laterality: N/A;  230  . ESOPHAGOGASTRODUODENOSCOPY N/A 01/02/2016   Procedure: ESOPHAGOGASTRODUODENOSCOPY (EGD);  Surgeon: Rogene Houston, MD;  [facility];  Service: Endoscopy;  Laterality: N/A;  240  . INSERT / REPLACE / REMOVE PACEMAKER     2012  . Wisdom tooth extracton      Family History  Problem Relation Age of Onset  . Colon cancer Brother   . Anxiety disorder Sister   . Depression Sister   . Anxiety disorder Sister   . Depression Sister     Social History Social History  Substance Use Topics  . Smoking status: Never Smoker  . Smokeless tobacco: Never Used  . Alcohol use No    Allergies  Allergen Reactions  . Penicillins Other (See Comments)    Caused patient to pass out.Can take cephalosporins Has patient had a PCN reaction causing immediate rash, facial/tongue/throat swelling, SOB or lightheadedness with hypotension: no Has patient had a PCN  reaction causing severe rash involving mucus membranes or skin necrosis: No Has patient had a PCN reaction that required hospitalization No Has patient had a PCN reaction occurring within the last 10 years: No If all of the above answers are "NO", then may proceed with Cephalosporin use.   . Levaquin [Levofloxacin] Nausea Only  . Sulfa Antibiotics Other (See Comments)    Unknown  . Zithromax [Azithromycin] Nausea Only and Rash    Current Outpatient Prescriptions  Medication Sig Dispense Refill  . acetaminophen (TYLENOL) 650 MG CR tablet Take 1,300 mg by mouth every 8 (eight) hours as needed for  pain.    Marland Kitchen albuterol (PROVENTIL HFA;VENTOLIN HFA) 108 (90 BASE) MCG/ACT inhaler Inhale 2 puffs into the lungs every 6 (six) hours as needed for wheezing or shortness of breath. 18 g 5  . ALPRAZolam (XANAX) 1 MG tablet 1 qhs prn insomnia 30 tablet 5  . amLODipine (NORVASC) 5 MG tablet Take 1 tablet (5 mg total) by mouth daily. 30 tablet 5  . busPIRone (BUSPAR) 10 MG tablet Take 1 tablet (10 mg total) by mouth 3 (three) times daily. 90 tablet 3  . dabigatran (PRADAXA) 150 MG CAPS capsule Take 1 capsule (150 mg total) by mouth 2 (two) times daily. 180 capsule 3  . DULoxetine (CYMBALTA) 60 MG capsule Take 1 capsule (60 mg total) by mouth 2 (two) times daily. 60 capsule 3  . flecainide (TAMBOCOR) 50 MG tablet Take 1 tablet (50 mg total) by mouth 2 (two) times daily. 180 tablet 3  . gabapentin (NEURONTIN) 300 MG capsule TAKE ONE CAPSULE BY MOUTH THREE TIMES DAILY 90 capsule 1  . guaiFENesin (MUCINEX) 600 MG 12 hr tablet Take 2 tablets (1,200 mg total) by mouth 2 (two) times daily. 10 tablet 0  . HYDROcodone-acetaminophen (NORCO/VICODIN) 5-325 MG tablet Take 1 tablet by mouth every 4 (four) hours as needed. 150 tablet 0  . levothyroxine (SYNTHROID, LEVOTHROID) 50 MCG tablet TAKE ONE TABLET BY MOUTH ONCE DAILY **CHANGE  IN  DOSE** 30 tablet 12  . lisinopril (PRINIVIL,ZESTRIL) 40 MG tablet Take 1 tablet (40 mg total) by mouth daily. 30 tablet 5  . metoprolol succinate (TOPROL-XL) 25 MG 24 hr tablet TAKE ONE TABLET BY MOUTH ONCE DAILY WITH  OR  IMMEDIATELY  FOLLOWING  A  MEAL 25 tablet 5  . ondansetron (ZOFRAN-ODT) 8 MG disintegrating tablet Take 1 tablet (8 mg total) by mouth every 8 (eight) hours as needed for nausea or vomiting. 30 tablet 5  . pantoprazole (PROTONIX) 40 MG tablet Take 1 tablet (40 mg total) by mouth daily. Reported on 01/28/2016 30 tablet 12  . pravastatin (PRAVACHOL) 80 MG tablet Take 1 tablet (80 mg total) by mouth daily. 30 tablet 5  . riTUXimab in sodium chloride 0.9 % 250 mL Infusion 2  weeks apart: May repeat in 6 months. Follow up with primary care doctor prior to initiation of rituximab for infectious clearance  0  . vitamin B-12 (CYANOCOBALAMIN) 1000 MCG tablet Take 1 tablet (1,000 mcg total) by mouth daily.     No current facility-administered medications for this visit.      Physical Exam  Blood pressure (!) 174/98, pulse 89, temperature 97.2 F (36.2 C), height 5\' 9"  (1.753 m), weight 172 lb (78 kg).  Constitutional: overall normal hygiene, normal nutrition, well developed, normal grooming, normal body habitus. Assistive device:cane  Musculoskeletal: gait and station Limp right, muscle tone and strength are normal, no tremors or atrophy is present.  Marland Kitchen  Neurological: coordination overall normal.  Deep tendon reflex/nerve stretch intact.  Sensation normal.  Cranial nerves II-XII intact.   Skin:   Normal overall no scars, lesions, ulcers or rashes. No psoriasis.  Psychiatric: Alert and oriented x 3.  Recent memory intact, remote memory unclear.  Normal mood and affect. Well groomed.  Good eye contact.  Cardiovascular: overall no swelling, no varicosities, no edema bilaterally, normal temperatures of the legs and arms, no clubbing, cyanosis and good capillary refill.  Lymphatic: palpation is normal.  The bilateral lower extremity is examined:  Inspection:  Thigh:  Non-tender and no defects  Knee has swelling 1+ effusion.                        Joint tenderness is present                        Patient is tender over the medial joint line  Lower Leg:  Has normal appearance and no tenderness or defects  Ankle:  Non-tender and no defects  Foot:  Non-tender and no defects Range of Motion:  Knee:  Range of motion is: 0-95 right, 0 to 105 left                        Crepitus is  present  Ankle:  Range of motion is normal. Strength and Tone:  The bilateral lower extremity has normal strength and tone. Stability:  Knee:  The knee is stable.  Ankle:  The ankle  is stable.  Spine/Pelvis examination:  Inspection:  Overall, sacoiliac joint benign and hips nontender; without crepitus or defects.   Thoracic spine inspection: Alignment normal without kyphosis present   Lumbar spine inspection:  Alignment  with normal lumbar lordosis, without scoliosis apparent.   Thoracic spine palpation:  without tenderness of spinal processes   Lumbar spine palpation: with tenderness of lumbar area; without tightness of lumbar muscles    Range of Motion:   Lumbar flexion, forward flexion is 30 with pain or tenderness    Lumbar extension is 5 with pain or tenderness   Left lateral bend is Normal  without pain or tenderness   Right lateral bend is Normal without pain or tenderness   Straight leg raising is Normal   Strength & tone: Normal   Stability overall normal stability     The patient has been educated about the nature of the problem(s) and counseled on treatment options.  The patient appeared to understand what I have discussed and is in agreement with it.  Encounter Diagnoses  Name Primary?  . Chronic right-sided low back pain with right-sided sciatica Yes  . Chronic pain of right knee   . Chronic pain of left knee     PLAN Call if any problems.  Precautions discussed.  Continue current medications.   Return to clinic 1 month   She will get pain medicine from Dr. Wolfgang Phoenix.  I will give prednisone dose pack.  She is to call [facility] Dr. Brien Few.    PROCEDURE NOTE:  The patient requests injections of the left knee , verbal consent was obtained.  The left knee was prepped appropriately after time out was performed.   Sterile technique was observed and injection of 1 cc of Depo-Medrol 40 mg with several cc's of plain xylocaine. Anesthesia was provided by ethyl chloride and a 20-gauge needle was used to inject the knee area. The injection was  tolerated well.  A band aid dressing was applied.  The patient was advised to apply ice later today  and tomorrow to the injection sight as needed.  PROCEDURE NOTE:  The patient requests injections of the right knee , verbal consent was obtained.  The right knee was prepped appropriately after time out was performed.   Sterile technique was observed and injection of 1 cc of Depo-Medrol 40 mg with several cc's of plain xylocaine. Anesthesia was provided by ethyl chloride and a 20-gauge needle was used to inject the knee area. The injection was tolerated well.  A band aid dressing was applied.  The patient was advised to apply ice later today and tomorrow to the injection sight as needed.  Electronically [place], MD 1/24/20182:42 PM

## 2016-10-29 ENCOUNTER — Ambulatory Visit: Payer: Medicare Other | Admitting: Family Medicine

## 2016-11-02 ENCOUNTER — Ambulatory Visit: Payer: Medicare Other | Admitting: Family Medicine

## 2016-11-03 ENCOUNTER — Ambulatory Visit: Payer: Medicare Other | Admitting: Family Medicine

## 2016-11-09 ENCOUNTER — Ambulatory Visit (INDEPENDENT_AMBULATORY_CARE_PROVIDER_SITE_OTHER): Payer: PPO | Admitting: Family Medicine

## 2016-11-09 ENCOUNTER — Encounter: Payer: Self-pay | Admitting: Family Medicine

## 2016-11-09 VITALS — BP 130/78 | Ht 69.0 in | Wt 170.2 lb

## 2016-11-09 DIAGNOSIS — J019 Acute sinusitis, unspecified: Secondary | ICD-10-CM

## 2016-11-09 DIAGNOSIS — Z79891 Long term (current) use of opiate analgesic: Secondary | ICD-10-CM

## 2016-11-09 DIAGNOSIS — I10 Essential (primary) hypertension: Secondary | ICD-10-CM | POA: Diagnosis not present

## 2016-11-09 DIAGNOSIS — F411 Generalized anxiety disorder: Secondary | ICD-10-CM | POA: Diagnosis not present

## 2016-11-09 DIAGNOSIS — G894 Chronic pain syndrome: Secondary | ICD-10-CM | POA: Diagnosis not present

## 2016-11-09 MED ORDER — HYDROCODONE-ACETAMINOPHEN 5-325 MG PO TABS: 1.0000 | ORAL_TABLET | ORAL | 0 refills | Status: DC | PRN

## 2016-11-09 MED ORDER — ALPRAZOLAM 1 MG PO TABS: ORAL_TABLET | ORAL | 5 refills | Status: DC

## 2016-11-09 MED ORDER — DOXYCYCLINE HYCLATE 100 MG PO TABS
100.0000 mg | ORAL_TABLET | Freq: Two times a day (BID) | ORAL | 0 refills | Status: DC
Start: 2016-11-09 — End: 2017-01-04

## 2016-11-09 NOTE — Progress Notes (Signed)
   Subjective:    Patient ID: Kelsey Sandoval, female    DOB: 10-11-1932, 83 y.o.   MRN: [id number]  HPI This patient was seen today for chronic pain  The medication list was reviewed and updated.   -Compliance with medication: yes  - Number patient states they take daily: 4 daily   -when was the last dose patient took: today   The patient was advised the importance of maintaining medication and not using illegal substances with these.  Refills needed: yes  The patient was educated that we can provide 3 monthly scripts for their medication, it is their responsibility to follow the instructions.  Side effects or complications from medications: none  Patient is aware that pain medications are meant to minimize the severity of the pain to allow their pain levels to improve to allow for better function. They are aware of that pain medications cannot totally remove their pain.  Due for UDT ( at least once per year) : due today   Patient is having some sinus trouble. Headache, pressure, sore throat. Onset several days ago.        Review of Systems  Constitutional: Negative for activity change and fever.  HENT: Positive for congestion and rhinorrhea. Negative for ear pain.   Eyes: Negative for discharge.  Respiratory: Positive for cough. Negative for shortness of breath and wheezing.   Cardiovascular: Negative for chest pain.       Objective:   Physical Exam  Constitutional: She appears well-developed and well-nourished. No distress.  HENT:  Head: Normocephalic.  Nose: Nose normal.  Mouth/Throat: Oropharynx is clear and moist. No oropharyngeal exudate.  Neck: Neck supple.  Cardiovascular: Normal rate, regular rhythm and normal heart sounds.   No murmur heard. Pulmonary/Chest: Effort normal and breath sounds normal. No respiratory distress. She has no wheezes.  Musculoskeletal: She exhibits no edema.  Lymphadenopathy:    She has no cervical adenopathy.  Neurological: She  is alert. She exhibits normal muscle tone.  Skin: Skin is warm and dry.  Psychiatric: Her behavior is normal.  Nursing note and vitals reviewed.   Blood pressures been under good control. Recent pneumonia although she is doing better in regards to this and a follow-up x-ray looked good Hypothyroidism doing well taking her medication as directed      Assessment & Plan:  Chronic pain refills given 3 prescriptions. Patient does a good job denies abusing it.  Chronic anxiety along with chronic insomnia may use her Xanax half tablet morning half in the evening she is aware of the possibility of interactions with opioids at told her that we will not be able to go up on the dose. Unfortunately we will not be able to stop the medicine because this is a chronic issue for her  Mild upper rest real illness sinusitis antibiotics prescribed warning signs discussed-patient was told that if her symptoms do not go away in regards to throat congestion we may need to set her up with ENT  Hypertension under good control continue current measures.

## 2016-11-12 ENCOUNTER — Other Ambulatory Visit (HOSPITAL_COMMUNITY): Payer: Self-pay | Admitting: Psychiatry

## 2016-11-13 LAB — TOXASSURE SELECT 13 (MW), URINE

## 2016-11-18 DIAGNOSIS — M5126 Other intervertebral disc displacement, lumbar region: Secondary | ICD-10-CM | POA: Diagnosis not present

## 2016-11-18 DIAGNOSIS — M5416 Radiculopathy, lumbar region: Secondary | ICD-10-CM | POA: Diagnosis not present

## 2016-11-20 ENCOUNTER — Other Ambulatory Visit: Payer: Self-pay | Admitting: *Deleted

## 2016-11-20 MED ORDER — PRAVASTATIN SODIUM 80 MG PO TABS: 80.0000 mg | ORAL_TABLET | Freq: Every day | ORAL | 0 refills | Status: DC

## 2016-11-20 MED ORDER — LISINOPRIL 40 MG PO TABS: 40.0000 mg | ORAL_TABLET | Freq: Every day | ORAL | 0 refills | Status: DC

## 2016-11-26 ENCOUNTER — Ambulatory Visit (INDEPENDENT_AMBULATORY_CARE_PROVIDER_SITE_OTHER): Payer: PPO

## 2016-11-26 ENCOUNTER — Encounter: Payer: Self-pay | Admitting: Orthopaedic Surgery

## 2016-11-26 ENCOUNTER — Ambulatory Visit (INDEPENDENT_AMBULATORY_CARE_PROVIDER_SITE_OTHER): Payer: PPO | Admitting: Orthopaedic Surgery

## 2016-11-26 VITALS — BP 143/81 | HR 97 | Temp 96.8°F | Ht 69.0 in | Wt 163.0 lb

## 2016-11-26 DIAGNOSIS — M25561 Pain in right knee: Secondary | ICD-10-CM

## 2016-11-26 DIAGNOSIS — G8929 Other chronic pain: Secondary | ICD-10-CM | POA: Diagnosis not present

## 2016-11-26 NOTE — Progress Notes (Signed)
CC:  I have pain of my right knee. I would like an injection.  The patient has chronic pain of the right knee.  There is no recent trauma.  There is no redness.  Injections in the past have helped.She fell several days ago and hurt her knee.  The knee has no redness, has an effusion and crepitus present.  ROM of the right knee is 0-100.  Impression:  Chronic knee pain right  Return: 3 weeks  PROCEDURE NOTE:  The patient requests injections of the right knee , verbal consent was obtained.  The right knee was prepped appropriately after time out was performed.   Sterile technique was observed and injection of 1 cc of Depo-Medrol 40 mg with several cc's of plain xylocaine. Anesthesia was provided by ethyl chloride and a 20-gauge needle was used to inject the knee area. The injection was tolerated well.  A band aid dressing was applied.  The patient was advised to apply ice later today and tomorrow to the injection sight as needed.  X-rays were done of the right knee, reported separately.  Return in three weeks.  Electronically Signed Sanjuana Kava, MD 2/22/20183:03 PM

## 2016-12-10 ENCOUNTER — Telehealth: Payer: Self-pay | Admitting: Cardiology

## 2016-12-10 ENCOUNTER — Ambulatory Visit (INDEPENDENT_AMBULATORY_CARE_PROVIDER_SITE_OTHER): Payer: PPO | Admitting: *Deleted

## 2016-12-10 DIAGNOSIS — I495 Sick sinus syndrome: Secondary | ICD-10-CM

## 2016-12-10 NOTE — Progress Notes (Signed)
Remote pacemaker transmission.   

## 2016-12-10 NOTE — Telephone Encounter (Signed)
Spoke with pt and reminded pt of remote transmission that is due today. Pt verbalized understanding.   

## 2016-12-11 ENCOUNTER — Encounter: Payer: Self-pay | Admitting: Cardiology

## 2016-12-11 LAB — CUP PACEART REMOTE DEVICE CHECK
Brady Statistic AP VP Percent: 1 %
Brady Statistic AP VS Percent: 0 %
Brady Statistic AS VP Percent: 1 %
Implantable Lead Implant Date: 20120810
Implantable Lead Location: 753859
Implantable Lead Model: 5076
Lead Channel Impedance Value: 438 Ohm
Lead Channel Pacing Threshold Amplitude: 1 V
Lead Channel Pacing Threshold Amplitude: 1.125 V
Lead Channel Pacing Threshold Pulse Width: 0.4 ms
Lead Channel Pacing Threshold Pulse Width: 0.4 ms
MDC IDC LEAD IMPLANT DT: 20120810
MDC IDC LEAD LOCATION: 753860
MDC IDC MSMT BATTERY IMPEDANCE: 274 Ohm
MDC IDC MSMT BATTERY REMAINING LONGEVITY: 113 mo
MDC IDC MSMT BATTERY VOLTAGE: 2.79 V
MDC IDC MSMT LEADCHNL RV IMPEDANCE VALUE: 733 Ohm
MDC IDC PG IMPLANT DT: 20120810
MDC IDC SESS DTM: 20180308165514
MDC IDC SET LEADCHNL RA PACING AMPLITUDE: 2 V
MDC IDC SET LEADCHNL RV PACING AMPLITUDE: 2.5 V
MDC IDC SET LEADCHNL RV PACING PULSEWIDTH: 0.4 ms
MDC IDC SET LEADCHNL RV SENSING SENSITIVITY: 5.6 mV
MDC IDC STAT BRADY AS VS PERCENT: 98 %

## 2016-12-17 ENCOUNTER — Ambulatory Visit (INDEPENDENT_AMBULATORY_CARE_PROVIDER_SITE_OTHER): Payer: PPO | Admitting: Orthopaedic Surgery

## 2016-12-17 ENCOUNTER — Ambulatory Visit (INDEPENDENT_AMBULATORY_CARE_PROVIDER_SITE_OTHER): Payer: PPO

## 2016-12-17 ENCOUNTER — Encounter: Payer: Self-pay | Admitting: Orthopaedic Surgery

## 2016-12-17 VITALS — BP 173/112 | HR 93 | Temp 97.3°F | Ht 69.0 in | Wt 170.0 lb

## 2016-12-17 DIAGNOSIS — G8929 Other chronic pain: Secondary | ICD-10-CM

## 2016-12-17 DIAGNOSIS — M25561 Pain in right knee: Secondary | ICD-10-CM | POA: Diagnosis not present

## 2016-12-17 NOTE — Progress Notes (Signed)
CC:  I have pain of my right knee. I would like an injection.  The patient has chronic pain of the right knee.  There is no recent trauma.  There is no redness.  Injections in the past have helped.  The knee has no redness, has an effusion and crepitus present.  ROM of the right knee is 0-95.  Impression:  Chronic knee pain right  Return: 1 month  PROCEDURE NOTE:  The patient requests injections of the right knee , verbal consent was obtained.  The right knee was prepped appropriately after time out was performed.   Sterile technique was observed and injection of 1 cc of Depo-Medrol 80 mg with several cc's of plain xylocaine. Anesthesia was provided by ethyl chloride and a 20-gauge needle was used to inject the knee area. The injection was tolerated well.  A band aid dressing was applied.  The patient was advised to apply ice later today and tomorrow to the injection sight as needed.  X-rays were done showing severe degenerative changes.  She is a candidate for a total knee but she has heart problems and is on a blood thinner.  Electronically Signed Sanjuana Kava, MD 3/15/20184:17 PM

## 2016-12-21 ENCOUNTER — Other Ambulatory Visit: Payer: Self-pay | Admitting: Family Medicine

## 2016-12-25 ENCOUNTER — Encounter: Payer: Self-pay | Admitting: Cardiology

## 2016-12-31 ENCOUNTER — Telehealth: Payer: Self-pay | Admitting: *Deleted

## 2016-12-31 NOTE — Telephone Encounter (Signed)
Consult with carolyn. Pt needs to continue meds at same dose and monitor her BP. If BP goes up any more then to follow up with [place] or ED per carolyn.  Pt needs appt on Monday with Dr. Nicki Reaper. Transferred to front to schedule office visit.

## 2016-12-31 NOTE — Telephone Encounter (Signed)
noted 

## 2016-12-31 NOTE — Telephone Encounter (Signed)
Discussed with pt. Pt verbalized understanding.  °

## 2016-12-31 NOTE — Telephone Encounter (Signed)
Pt states she was feeling bad and having headache every night. Check bp at pharm on Monday it was 235/110. Pt states dr Nicki Reaper decreased metoprolol from 50 daily to 25 daily because she was passing out. Pt states she started back on taking 50mg  on Monday. She also takes amlodipine 10mg  daily ( 5mg  is on her list but she says she does take 10mg ) and lisinopril 40mg . She has taken all three today. Since Monday readings are 148- 170's over 100's - 110's. Pt not having any symptoms. No headache, no chest pain, feeling fine. Please advise. Pt wanted message to go to carolyn.

## 2017-01-01 ENCOUNTER — Emergency Department (HOSPITAL_COMMUNITY)
Admission: EM | Admit: 2017-01-01 | Discharge: 2017-01-01 | Disposition: A | Discharge status: Home / Self Care | Payer: PPO | Attending: Emergency Medicine | Admitting: Emergency Medicine

## 2017-01-01 ENCOUNTER — Encounter (HOSPITAL_COMMUNITY): Payer: Self-pay | Admitting: Emergency Medicine

## 2017-01-01 DIAGNOSIS — N183 Chronic kidney disease, stage 3 (moderate): Secondary | ICD-10-CM | POA: Insufficient documentation

## 2017-01-01 DIAGNOSIS — E039 Hypothyroidism, unspecified: Secondary | ICD-10-CM | POA: Diagnosis not present

## 2017-01-01 DIAGNOSIS — Z79899 Other long term (current) drug therapy: Secondary | ICD-10-CM | POA: Insufficient documentation

## 2017-01-01 DIAGNOSIS — I10 Essential (primary) hypertension: Secondary | ICD-10-CM

## 2017-01-01 DIAGNOSIS — I129 Hypertensive chronic kidney disease with stage 1 through stage 4 chronic kidney disease, or unspecified chronic kidney disease: Secondary | ICD-10-CM | POA: Insufficient documentation

## 2017-01-01 DIAGNOSIS — R030 Elevated blood-pressure reading, without diagnosis of hypertension: Secondary | ICD-10-CM | POA: Diagnosis not present

## 2017-01-01 NOTE — ED Provider Notes (Signed)
[facility] DEPT Provider Note   CSN: [id number] Arrival date & time: 01/01/17  1006   By signing my name below, I, Hilbert Odor, attest that this documentation has been prepared under the direction and in the presence of [name], MD. Electronically Signed: Hilbert Odor, Scribe. 01/01/17. 10:48 AM. History   Chief Complaint Chief Complaint  Patient presents with  . Hypertension    The history is provided by the patient and a relative. No language interpreter was used.  HPI Comments: Kelsey Sandoval is a 83 y.o. female brought in by ambulance, who presents to the Emergency Department complaining of a headache that began this morning.  She states that her headache began in her neck and went to the back of her head. Her headache is much improved currently. The patient states that she was in [place] 4 days ago and wasn't feeling very well. She took her BP while in [place] and she noticed that it was elevated. She states that her headache began earlier today and she checked her BP again and it was noted to be 190/104. She tried to call her PCP today but he was out of town. She has a hx of HTN and her medications were recently adjusted by her PCP about a month ago. The patient is currently taking metoprolol ER 25 mg (reduction form 50 mg), lisinopril 40 mg, and Amlodipine 5 mg (reduction from 10 mg). She states that she is feeling okay now. She denies any pain currently.  Past Medical History:  Diagnosis Date  . Anxiety   . Arthritis   . Back pain, chronic    Sciatica  . Cataracts, bilateral   . Depression   . Essential hypertension   . Hyperlipidemia   . Membranous nephropathy determined by biopsy 06/07/2015  . Nephrotic syndrome    RHUX  . Neuropathy ([place])   . Osteopenia   . Pacemaker    [employer]  . PAF (paroxysmal atrial fibrillation) ([place])   . Pre-diabetes   . Tachycardia-bradycardia syndrome [facility])     Patient Active Problem List   Diagnosis Date Noted  . Lobar  pneumonia, unspecified organism ([place]) 09/11/2016  . Acute bronchitis 09/11/2016  . Chronic anticoagulation 09/07/2016  . Chronic kidney disease (CKD), stage III (moderate) 09/07/2016  . Hereditary and idiopathic peripheral neuropathy 03/05/2016  . Major depression 12/04/2015  . Major depression in remission ([place]) 09/02/2015  . Membranous nephropathy determined by biopsy 06/07/2015  . Osteoarthritis of both knees 03/26/2015  . Chronic pain syndrome 03/26/2015  . Intractable nausea and vomiting   . Nausea with vomiting   . Malnutrition of moderate degree ([place]) 03/08/2015  . CAP (community acquired pneumonia) 03/08/2015  . UTI (lower urinary tract infection) 03/07/2015  . Hypothyroidism 02/14/2015  . Disorder of kidney 01/17/2015  . Nephrotic syndrome 06/21/2014  . Proteinuria 03/29/2014  . Osteopenia 03/29/2014  . Anemia, iron deficiency 08/22/2013  . Mitral insufficiency 06/11/2013  . Pacemaker 06/11/2013  . Paroxysmal atrial fibrillation ([place]) 09/26/2012  . Tachycardia-bradycardia syndrome ([place]) 09/26/2012  . Sick sinus syndrome ([place]) 09/26/2012  . Systemic hypertension 09/26/2012  . Dyslipidemia 09/26/2012    Past Surgical History:  Procedure Laterality Date  . ABDOMINAL HYSTERECTOMY     partial-pt has no ovaries  . APPENDECTOMY    . BACK SURGERY    . BIOPSY N/A 01/17/2014   Procedure: BIOPSY;  Surgeon: Rogene Houston, MD;  Location: [facility] END[facility];  Service: Endoscopy;  Laterality: N/A;  . CATARACT EXTRACTION W/PHACO Left 02/28/2015  Procedure: CATARACT EXTRACTION PHACO AND INTRAOCULAR LENS PLACEMENT (IOC);  Surgeon: [name], MD;  Location: [facility] ORS;  Service: Ophthalmology;  Laterality: Left;  CDE 18.71  . COLONOSCOPY  9/05  . COLONOSCOPY N/A 10/11/2013   Procedure: COLONOSCOPY;  Surgeon: Rogene Houston, MD;  Location: [facility];  Service: Endoscopy;  Laterality: N/A;  1200  . ESOPHAGOGASTRODUODENOSCOPY N/A 01/17/2014   Procedure: ESOPHAGOGASTRODUODENOSCOPY (EGD);   Surgeon: Rogene Houston, MD;  Location: [facility];  Service: Endoscopy;  Laterality: N/A;  230  . ESOPHAGOGASTRODUODENOSCOPY N/A 01/02/2016   Procedure: ESOPHAGOGASTRODUODENOSCOPY (EGD);  Surgeon: Rogene Houston, MD;  Location: [facility];  Service: Endoscopy;  Laterality: N/A;  240  . INSERT / REPLACE / REMOVE PACEMAKER     2012  . Wisdom tooth extracton      OB History    Gravida Para Term Preterm AB Living   2 2 2          SAB TAB Ectopic Multiple Live Births                   Home Medications    Prior to Admission medications   Medication Sig Start Date End Date Taking? Authorizing Provider  acetaminophen (TYLENOL) 650 MG CR tablet Take 1,300 mg by mouth every 8 (eight) hours as needed for pain.   Yes Historical Provider, MD  albuterol (PROVENTIL HFA;VENTOLIN HFA) 108 (90 BASE) MCG/ACT inhaler Inhale 2 puffs into the lungs every 6 (six) hours as needed for wheezing or shortness of breath. 09/17/14  Yes Kathyrn Drown, MD  ALPRAZolam Duanne Moron) 1 MG tablet 1 qhs prn insomnia Patient taking differently: Take 1 mg by mouth at bedtime as needed for sleep. 1 qhs prn insomnia 11/09/16  Yes Kathyrn Drown, MD  amLODipine (NORVASC) 10 MG tablet Take 10 mg by mouth daily.   Yes Historical Provider, MD  busPIRone (BUSPAR) 10 MG tablet Take 1 tablet (10 mg total) by mouth 3 (three) times daily. 10/27/16  Yes Cloria Spring, MD  dabigatran (PRADAXA) 150 MG CAPS capsule Take 1 capsule (150 mg total) by mouth 2 (two) times daily. 07/03/16  Yes Herminio Commons, MD  DULoxetine (CYMBALTA) 60 MG capsule Take 1 capsule (60 mg total) by mouth 2 (two) times daily. 10/27/16 10/27/17 Yes Cloria Spring, MD  flecainide (TAMBOCOR) 50 MG tablet Take 1 tablet (50 mg total) by mouth 2 (two) times daily. 07/03/16  Yes Herminio Commons, MD  gabapentin (NEURONTIN) 300 MG capsule TAKE ONE CAPSULE BY MOUTH THREE TIMES DAILY 12/21/16  Yes Kathyrn Drown, MD  HYDROcodone-acetaminophen (NORCO/VICODIN) 5-325 MG  tablet Take 1 tablet by mouth every 4 (four) hours as needed. 11/09/16  Yes Kathyrn Drown, MD  levothyroxine (SYNTHROID, LEVOTHROID) 50 MCG tablet TAKE ONE TABLET BY MOUTH ONCE DAILY **CHANGE  IN  DOSE** 01/28/16  Yes Kathyrn Drown, MD  lisinopril (PRINIVIL,ZESTRIL) 40 MG tablet Take 1 tablet (40 mg total) by mouth daily. 11/20/16  Yes Kathyrn Drown, MD  metoprolol succinate (TOPROL-XL) 50 MG 24 hr tablet Take 50 mg by mouth daily. Take with or immediately following a meal.   Yes Historical Provider, MD  ondansetron (ZOFRAN-ODT) 8 MG disintegrating tablet Take 1 tablet (8 mg total) by mouth every 8 (eight) hours as needed for nausea or vomiting. 10/06/16  Yes Kathyrn Drown, MD  pantoprazole (PROTONIX) 40 MG tablet Take 1 tablet (40 mg total) by mouth daily. Reported on 01/28/2016 01/28/16  Yes  Kathyrn Drown, MD  pravastatin (PRAVACHOL) 80 MG tablet Take 1 tablet (80 mg total) by mouth daily. 11/20/16  Yes Kathyrn Drown, MD  vitamin B-12 (CYANOCOBALAMIN) 1000 MCG tablet Take 1 tablet (1,000 mcg total) by mouth daily. 03/11/15  Yes Kathie Dike, MD  amLODipine (NORVASC) 5 MG tablet Take 1 tablet (5 mg total) by mouth daily. Patient not taking: Reported on 01/01/2017 09/17/16   Kathyrn Drown, MD  doxycycline (VIBRA-TABS) 100 MG tablet Take 1 tablet (100 mg total) by mouth 2 (two) times daily. Patient not taking: Reported on 01/01/2017 11/09/16   Kathyrn Drown, MD  metoprolol succinate (TOPROL-XL) 25 MG 24 hr tablet TAKE ONE TABLET BY MOUTH ONCE DAILY WITH  OR  IMMEDIATELY  FOLLOWING  A  MEAL Patient not taking: Reported on 01/01/2017 09/17/16   Kathyrn Drown, MD  predniSONE (STERAPRED UNI-PAK 21 TAB) 5 MG (21) TBPK tablet Take 6 pills first day; 5 pills second day; 4 pills third day; 3 pills fourth day; 2 pills next day and 1 pill last day. Patient not taking: Reported on 01/01/2017 10/28/16   Sanjuana Kava, MD  riTUXimab in sodium chloride 0.9 % 250 mL Infusion 2 weeks apart: May repeat in 6 months. Follow up  with primary care doctor prior to initiation of rituximab for infectious clearance Patient not taking: Reported on 01/01/2017 03/11/15   Kathie Dike, MD    Family History Family History  Problem Relation Age of Onset  . Colon cancer Brother   . Anxiety disorder Sister   . Depression Sister   . Anxiety disorder Sister   . Depression Sister     Social History Social History  Substance Use Topics  . Smoking status: Never Smoker  . Smokeless tobacco: Never Used  . Alcohol use No     Allergies   Penicillins; Levaquin [levofloxacin]; Sulfa antibiotics; and Zithromax [azithromycin]   Review of Systems Review of Systems A complete 10 system review of systems was obtained and all systems are negative except as noted in the HPI and PMH.   Physical Exam Updated Vital Signs BP (!) 138/103 (BP Location: Left Arm)   Pulse 97   Temp 97.9 F (36.6 C) (Oral)   Resp 18   Ht 5\' 11"  (1.803 m)   Wt 170 lb (77.1 kg)   SpO2 94%   BMI 23.71 kg/m   Physical Exam  Constitutional: She is oriented to person, place, and time. She appears well-developed and well-nourished.  HENT:  Head: Normocephalic and atraumatic.  Eyes: Conjunctivae are normal.  Neck: Neck supple.  Cardiovascular: Normal rate and regular rhythm.   Pulmonary/Chest: Effort normal and breath sounds normal.  Abdominal: Soft. Bowel sounds are normal.  Musculoskeletal: Normal range of motion.  Neurological: She is alert and oriented to person, place, and time.  Skin: Skin is warm and dry.  Psychiatric: She has a normal mood and affect. Her behavior is normal.  Nursing note and vitals reviewed.    ED Treatments / Results  DIAGNOSTIC STUDIES: Oxygen Saturation is 98% on RA, normal by my interpretation.    COORDINATION OF CARE: 10:27 AM Discussed treatment plan with pt at bedside and pt agreed to plan. I will check the patient's EKG. I will adjust the patient's BP medications: Metoprolol and Amlodipine.  Labs (all  labs ordered are listed, but only abnormal results are displayed) Labs Reviewed - No data to display  EKG  EKG Interpretation None       Radiology  No results found.  Procedures Procedures (including critical care time)  Medications Ordered in ED Medications - No data to display   Initial Impression / Assessment and Plan / ED Course  I have reviewed the triage vital signs and the nursing notes.  Pertinent labs & imaging results that were available during my care of the patient were reviewed by me and considered in my medical decision making (see chart for details).     Patient is hemodynamically stable. I recommended increasing amlodipine from 5 mg to 10 mg daily and metoprolol ER from 25 to 50 mg daily.  This was discussed with the patient, her son, her son-in-law. They all agree with treatment plan.  Final Clinical Impressions(s) / ED Diagnoses   Final diagnoses:  Hypertension, unspecified type    New Prescriptions New Prescriptions   No medications on file  I personally performed the services described in this documentation, which was scribed in my presence. The recorded information has been reviewed and is accurate.     [name], MD 01/01/17 1116

## 2017-01-01 NOTE — Discharge Instructions (Signed)
Rest. Decrease salt in your diet. Increase amlodipine from 5 mg to 10 mg daily.  Also recommend increasing metoprolol ER 25 mg to 50 mg.  Follow-up your primary care doctor on Monday.

## 2017-01-01 NOTE — ED Notes (Signed)
Pt made aware to return if symptoms worsen or if any life threatening symptoms occur.   

## 2017-01-01 NOTE — ED Triage Notes (Signed)
Pt c/o elevated bp and ha this am. Denies ha at present. Pt states 2 of her bp medications were decreased about 1 month ago. Pt states she called her pcp office this am but her pcp is out of town. No neuro deficits noted.

## 2017-01-01 NOTE — ED Triage Notes (Addendum)
Pt reports family is concerned with high blood pressure.  Pt reports recent change in BP dose. Pt says she feels fine and wants to go home.

## 2017-01-04 ENCOUNTER — Encounter: Payer: Self-pay | Admitting: Family Medicine

## 2017-01-04 ENCOUNTER — Ambulatory Visit (INDEPENDENT_AMBULATORY_CARE_PROVIDER_SITE_OTHER): Payer: PPO | Admitting: Family Medicine

## 2017-01-04 VITALS — BP 130/82 | Ht 71.0 in | Wt 167.0 lb

## 2017-01-04 DIAGNOSIS — E038 Other specified hypothyroidism: Secondary | ICD-10-CM | POA: Diagnosis not present

## 2017-01-04 DIAGNOSIS — I10 Essential (primary) hypertension: Secondary | ICD-10-CM

## 2017-01-04 MED ORDER — METOPROLOL SUCCINATE ER 50 MG PO TB24: 50.0000 mg | ORAL_TABLET | Freq: Every day | ORAL | 5 refills | Status: DC

## 2017-01-04 MED ORDER — AMLODIPINE BESYLATE 10 MG PO TABS: 10.0000 mg | ORAL_TABLET | Freq: Every day | ORAL | 5 refills | Status: DC

## 2017-01-04 NOTE — Patient Instructions (Addendum)
Bring monitor with you  DASH Eating Plan DASH stands for "Dietary Approaches to Stop Hypertension." The DASH eating plan is a healthy eating plan that has been shown to reduce high blood pressure (hypertension). It may also reduce your risk for type 2 diabetes, heart disease, and stroke. The DASH eating plan may also help with weight loss. What are tips for following this plan? General guidelines   Avoid eating more than 2,300 mg (milligrams) of salt (sodium) a day. If you have hypertension, you may need to reduce your sodium intake to 1,500 mg a day.  Limit alcohol intake to no more than 1 drink a day for nonpregnant women and 2 drinks a day for men. One drink equals 12 oz of beer, 5 oz of wine, or 1 oz of hard liquor.  Work with your health care provider to maintain a healthy body weight or to lose weight. Ask what an ideal weight is for you.  Get at least 30 minutes of exercise that causes your heart to beat faster (aerobic exercise) most days of the week. Activities may include walking, swimming, or biking.  Work with your health care provider or diet and nutrition specialist (dietitian) to adjust your eating plan to your individual calorie needs. Reading food labels   Check food labels for the amount of sodium per serving. Choose foods with less than 5 percent of the Daily Value of sodium. Generally, foods with less than 300 mg of sodium per serving fit into this eating plan.  To find whole grains, look for the word "whole" as the first word in the ingredient list. Shopping   Buy products labeled as "low-sodium" or "no salt added."  Buy fresh foods. Avoid canned foods and premade or frozen meals. Cooking   Avoid adding salt when cooking. Use salt-free seasonings or herbs instead of table salt or sea salt. Check with your health care provider or pharmacist before using salt substitutes.  Do not fry foods. Cook foods using healthy methods such as baking, boiling, grilling, and  broiling instead.  Cook with heart-healthy oils, such as olive, canola, soybean, or sunflower oil. Meal planning    Eat a balanced diet that includes:  5 or more servings of fruits and vegetables each day. At each meal, try to fill half of your plate with fruits and vegetables.  Up to 6-8 servings of whole grains each day.  Less than 6 oz of lean meat, poultry, or fish each day. A 3-oz serving of meat is about the same size as a deck of cards. One egg equals 1 oz.  2 servings of low-fat dairy each day.  A serving of nuts, seeds, or beans 5 times each week.  Heart-healthy fats. Healthy fats called Omega-3 fatty acids are found in foods such as flaxseeds and coldwater fish, like sardines, salmon, and mackerel.  Limit how much you eat of the following:  Canned or prepackaged foods.  Food that is high in trans fat, such as fried foods.  Food that is high in saturated fat, such as fatty meat.  Sweets, desserts, sugary drinks, and other foods with added sugar.  Full-fat dairy products.  Do not salt foods before eating.  Try to eat at least 2 vegetarian meals each week.  Eat more home-cooked food and less restaurant, buffet, and fast food.  When eating at a restaurant, ask that your food be prepared with less salt or no salt, if possible. What foods are recommended? The items listed may not  be a complete list. Talk with your dietitian about what dietary choices are best for you. Grains  Whole-grain or whole-wheat bread. Whole-grain or whole-wheat pasta. Brown rice. [place]. Bulgur. Whole-grain and low-sodium cereals. Pita bread. Low-fat, low-sodium crackers. Whole-wheat flour tortillas. Vegetables  Fresh or frozen vegetables (raw, steamed, roasted, or grilled). Low-sodium or reduced-sodium tomato and vegetable juice. Low-sodium or reduced-sodium tomato sauce and tomato paste. Low-sodium or reduced-sodium canned vegetables. Fruits  All fresh, dried, or frozen fruit.  Canned fruit in natural juice (without added sugar). Meat and other protein foods  Skinless chicken or [place]. Ground chicken or [place]. Pork with fat trimmed off. Fish and seafood. Egg whites. Dried beans, peas, or lentils. Unsalted nuts, nut butters, and seeds. Unsalted canned beans. Lean cuts of beef with fat trimmed off. Low-sodium, lean deli meat. Dairy  Low-fat (1%) or fat-free (skim) milk. Fat-free, low-fat, or reduced-fat cheeses. Nonfat, low-sodium ricotta or cottage cheese. Low-fat or nonfat yogurt. Low-fat, low-sodium cheese. Fats and oils  Soft margarine without trans fats. Vegetable oil. Low-fat, reduced-fat, or light mayonnaise and salad dressings (reduced-sodium). Canola, safflower, olive, soybean, and sunflower oils. Avocado. Seasoning and other foods  Herbs. Spices. Seasoning mixes without salt. Unsalted popcorn and pretzels. Fat-free sweets. What foods are not recommended? The items listed may not be a complete list. Talk with your dietitian about what dietary choices are best for you. Grains  Baked goods made with fat, such as croissants, muffins, or some breads. Dry pasta or rice meal packs. Vegetables  Creamed or fried vegetables. Vegetables in a cheese sauce. Regular canned vegetables (not low-sodium or reduced-sodium). Regular canned tomato sauce and paste (not low-sodium or reduced-sodium). Regular tomato and vegetable juice (not low-sodium or reduced-sodium). Angie Fava. Olives. Fruits  Canned fruit in a light or heavy syrup. Fried fruit. Fruit in cream or butter sauce. Meat and other protein foods  Fatty cuts of meat. Ribs. Fried meat. Berniece Salines. Sausage. Bologna and other processed lunch meats. Salami. Fatback. Hotdogs. Bratwurst. Salted nuts and seeds. Canned beans with added salt. Canned or smoked fish. Whole eggs or egg yolks. Chicken or [place] with skin. Dairy  Whole or 2% milk, cream, and half-and-half. Whole or full-fat cream cheese. Whole-fat or sweetened yogurt.  Full-fat cheese. Nondairy creamers. Whipped toppings. Processed cheese and cheese spreads. Fats and oils  Butter. Stick margarine. Lard. Shortening. Ghee. Bacon fat. Tropical oils, such as coconut, palm kernel, or palm oil. Seasoning and other foods  Salted popcorn and pretzels. Onion salt, garlic salt, seasoned salt, table salt, and sea salt. Worcestershire sauce. Tartar sauce. Barbecue sauce. Teriyaki sauce. Soy sauce, including reduced-sodium. Steak sauce. Canned and packaged gravies. Fish sauce. Oyster sauce. Cocktail sauce. Horseradish that you find on the shelf. Ketchup. Mustard. Meat flavorings and tenderizers. Bouillon cubes. Hot sauce and Tabasco sauce. Premade or packaged marinades. Premade or packaged taco seasonings. Relishes. Regular salad dressings. Where to find more information:  National Heart, Lung, and [place]: https://wilson-eaton.com/  American Heart Association: www.heart.org Summary  The DASH eating plan is a healthy eating plan that has been shown to reduce high blood pressure (hypertension). It may also reduce your risk for type 2 diabetes, heart disease, and stroke.  With the DASH eating plan, you should limit salt (sodium) intake to 2,300 mg a day. If you have hypertension, you may need to reduce your sodium intake to 1,500 mg a day.  When on the DASH eating plan, aim to eat more fresh fruits and vegetables, whole grains, lean proteins, low-fat dairy,  and heart-healthy fats.  Work with your health care provider or diet and nutrition specialist (dietitian) to adjust your eating plan to your individual calorie needs. This information is not intended to replace advice given to you by your health care provider. Make sure you discuss any questions you have with your health care provider. Document Released: 09/10/2011 Document Revised: 09/14/2016 Document Reviewed: 09/14/2016 Elsevier Interactive Patient Education  2017 [employer].

## 2017-01-04 NOTE — Progress Notes (Signed)
   Subjective:    Patient ID: Kelsey Sandoval, female    DOB: 1933-07-11, 83 y.o.   MRN: [id number]  Hypertension  This is a chronic problem. Pertinent negatives include no chest pain, headaches or shortness of breath. Treatments tried: amlodipine 10mg , metoprolol 50mg  qd. Compliance problems: could eat healthier, not able to exercise because of knee pain.    Went to ED on 3/30 for HTN.  ER note was reviewed in detail Patient had exceptionally elevated blood pressure We had reduced her medicine because she was having dizzy spells would obviously with her blood pressure mean elevated it is necessary for her to be on higher levels  Review of Systems  Constitutional: Negative for activity change, fatigue and fever.  Respiratory: Negative for cough and shortness of breath.   Cardiovascular: Negative for chest pain and leg swelling.  Neurological: Negative for headaches.       Objective:   Physical Exam  Constitutional: She appears well-nourished. No distress.  Cardiovascular: Normal rate and normal heart sounds.   No murmur heard. Pulmonary/Chest: Effort normal and breath sounds normal. No respiratory distress.  Musculoskeletal: She exhibits no edema.  Lymphadenopathy:    She has no cervical adenopathy.  Neurological: She is alert. She exhibits normal muscle tone.  Psychiatric: Her behavior is normal.  Vitals reviewed.     Blood pressure on recheck 130/82    Assessment & Plan:  HTN- Patient was seen today as part of a visit regarding hypertension. The importance of healthy diet and regular physical activity was discussed. The importance of compliance with medications discussed. Ideal goal is to keep blood pressure low elevated levels certainly below 017/51 when possible. The patient was counseled that keeping blood pressure under control lessen his risk of heart attack, stroke, kidney failure, and early death. The importance of regular follow-ups was discussed with the patient.  Low-salt diet such as DASH recommended. Regular physical activity was recommended as well. Patient was advised to keep regular follow-ups.  I am concerned about the possibility of underlying secondary issues we will recheck thyroid function because a hypothyroidism as well as her medication check the level plus also plasma metanephrines for a unlikely pheochromocytoma  She will follow-up in 3 weeks bring her monitor at that time she will call us if any problems before then plus also patient will notify us if any issues finally we will move her standard 3 month appointment in May to April

## 2017-01-05 DIAGNOSIS — E038 Other specified hypothyroidism: Secondary | ICD-10-CM | POA: Diagnosis not present

## 2017-01-05 DIAGNOSIS — I10 Essential (primary) hypertension: Secondary | ICD-10-CM | POA: Diagnosis not present

## 2017-01-06 ENCOUNTER — Telehealth: Payer: Self-pay | Admitting: Family Medicine

## 2017-01-06 NOTE — Telephone Encounter (Signed)
Spoke with patient and informed her that Dr.Scott Luking changed her Metoprolol on 01/04/17 and she is now on 50 MG dose and she was previously on 25. Informed patient that she could pick of the new dose and insurance should cover it do to it being a new dose. Informed patient there may be a copay. Patient verbalized understanding.

## 2017-01-06 NOTE — Telephone Encounter (Signed)
Patient seen Dr. Nicki Reaper on 01/04/17.  She is requesting a nurse to call her back regarding questions she has on the medication she was prescribed.

## 2017-01-06 NOTE — Telephone Encounter (Signed)
Pt called stating that she only has 4 metoprolol left and that it isn't due to be refilled until the 19th. Pt states that her bp was real high while Dr. Nicki Reaper was out of the office and she doubled up on them to get her bp to come down. Pt is needing to know what she should do. Please advise.

## 2017-01-09 LAB — METANEPHRINES, PLASMA
Metanephrine, Free: 22 pg/mL (ref 0–62)
NORMETANEPHRINE FREE: 155 pg/mL — AB (ref 0–145)

## 2017-01-09 LAB — T4, FREE: FREE T4: 1.57 ng/dL (ref 0.82–1.77)

## 2017-01-09 LAB — TSH: TSH: 1.58 u[IU]/mL (ref 0.450–4.500)

## 2017-01-15 ENCOUNTER — Telehealth: Payer: Self-pay | Admitting: Family Medicine

## 2017-01-15 NOTE — Telephone Encounter (Signed)
(  She had called her insurance agent at [employer] and he called [place] for her.) Patient tried to get refill on ondansetron (ZOFRAN-ODT) 8 MG disintegrating tablet but it needs prior auth for quantity limitations. Looks like she may be taking too much of this since she got rx #30 with 5 refills in January.  He said we can call patient at [phone].

## 2017-01-15 NOTE — Telephone Encounter (Signed)
The prior approval was submitted today but patient has exceeded the quanity limitations of her plan- approval is under review

## 2017-01-15 NOTE — Telephone Encounter (Signed)
If the patient is having to use it that frequently I would recommend a follow-up office visit within the next few weeks to discuss why she is having so much nausea-she shouldn't have to take it that often

## 2017-01-15 NOTE — Telephone Encounter (Signed)
Left message to return call 

## 2017-01-15 NOTE — Telephone Encounter (Signed)
Discussed with pt. Pt verbalized understanding. She already has an appt on April 23rd for a follow up.

## 2017-01-19 ENCOUNTER — Other Ambulatory Visit: Payer: Self-pay | Admitting: *Deleted

## 2017-01-19 ENCOUNTER — Ambulatory Visit: Payer: Self-pay | Admitting: Orthopaedic Surgery

## 2017-01-19 ENCOUNTER — Ambulatory Visit (INDEPENDENT_AMBULATORY_CARE_PROVIDER_SITE_OTHER): Payer: PPO | Admitting: Family Medicine

## 2017-01-19 VITALS — Ht 71.0 in

## 2017-01-19 DIAGNOSIS — R110 Nausea: Secondary | ICD-10-CM | POA: Diagnosis not present

## 2017-01-19 MED ORDER — PRAVASTATIN SODIUM 80 MG PO TABS: 80.0000 mg | ORAL_TABLET | Freq: Every day | ORAL | 1 refills | Status: DC

## 2017-01-19 MED ORDER — HYDROXYZINE HCL 10 MG PO TABS: ORAL_TABLET | ORAL | 2 refills | Status: DC

## 2017-01-19 MED ORDER — LISINOPRIL 40 MG PO TABS: 40.0000 mg | ORAL_TABLET | Freq: Every day | ORAL | 1 refills | Status: DC

## 2017-01-19 NOTE — Progress Notes (Signed)
   Subjective:    Patient ID: Kelsey Sandoval, female    DOB: 1933/08/29, 83 y.o.   MRN: [id number]  HPI Patient arrives with c/o nausea and vomiting- cant eat just feels bad for 2.5 weeks. This patient's had similar episodes before where she gets hit with waves of nausea. Then it takes often a few weeks to get better. She's been using Zofran frequently. She denies any rectal bleeding denies high fever chills sweats.  Review of Systems Please see above.    Objective:   Physical Exam Lungs clear hearts regular HEENT benign extremities no edema skin warm dry abdomen is soft no guarding or rebound  Patient denies any changes in her medicines     Assessment & Plan:  Nausea we will try Vistaril she usually takes Zofran on a regular basis sometimes once or twice daily because of persistent nausea unfortunately her insurance company will not let her get more we will try to appeal this  This patient is due for a standard follow-up next week I've encouraged her to bring all of her medications with her. If she gets worse possibly will have to go to the ER.  Hydroxyzine hopefully will help her with the nausea  Patient will follow-up next week

## 2017-01-20 ENCOUNTER — Encounter (HOSPITAL_COMMUNITY): Payer: Self-pay | Admitting: *Deleted

## 2017-01-20 ENCOUNTER — Emergency Department (HOSPITAL_COMMUNITY): Payer: PPO

## 2017-01-20 ENCOUNTER — Observation Stay (HOSPITAL_COMMUNITY)
Admission: EM | Admit: 2017-01-20 | Discharge: 2017-01-21 | Disposition: A | Discharge status: Home / Self Care | Payer: PPO | Attending: Internal Medicine | Admitting: Internal Medicine

## 2017-01-20 DIAGNOSIS — I160 Hypertensive urgency: Secondary | ICD-10-CM

## 2017-01-20 DIAGNOSIS — E038 Other specified hypothyroidism: Secondary | ICD-10-CM | POA: Diagnosis not present

## 2017-01-20 DIAGNOSIS — I480 Paroxysmal atrial fibrillation: Principal | ICD-10-CM | POA: Insufficient documentation

## 2017-01-20 DIAGNOSIS — I4891 Unspecified atrial fibrillation: Secondary | ICD-10-CM | POA: Diagnosis not present

## 2017-01-20 DIAGNOSIS — I10 Essential (primary) hypertension: Secondary | ICD-10-CM | POA: Insufficient documentation

## 2017-01-20 DIAGNOSIS — N183 Chronic kidney disease, stage 3 unspecified: Secondary | ICD-10-CM | POA: Diagnosis present

## 2017-01-20 DIAGNOSIS — Z950 Presence of cardiac pacemaker: Secondary | ICD-10-CM | POA: Diagnosis present

## 2017-01-20 DIAGNOSIS — G894 Chronic pain syndrome: Secondary | ICD-10-CM | POA: Diagnosis not present

## 2017-01-20 DIAGNOSIS — R110 Nausea: Secondary | ICD-10-CM | POA: Diagnosis not present

## 2017-01-20 DIAGNOSIS — R000 Tachycardia, unspecified: Secondary | ICD-10-CM | POA: Diagnosis not present

## 2017-01-20 DIAGNOSIS — F419 Anxiety disorder, unspecified: Secondary | ICD-10-CM | POA: Diagnosis not present

## 2017-01-20 DIAGNOSIS — I495 Sick sinus syndrome: Secondary | ICD-10-CM | POA: Diagnosis present

## 2017-01-20 DIAGNOSIS — Z79899 Other long term (current) drug therapy: Secondary | ICD-10-CM | POA: Diagnosis not present

## 2017-01-20 DIAGNOSIS — E039 Hypothyroidism, unspecified: Secondary | ICD-10-CM | POA: Diagnosis present

## 2017-01-20 DIAGNOSIS — R0602 Shortness of breath: Secondary | ICD-10-CM | POA: Diagnosis not present

## 2017-01-20 LAB — COMPREHENSIVE METABOLIC PANEL
ALBUMIN: 3.8 g/dL (ref 3.5–5.0)
ALT: 20 U/L (ref 14–54)
ANION GAP: 11 (ref 5–15)
AST: 39 U/L (ref 15–41)
Alkaline Phosphatase: 57 U/L (ref 38–126)
BILIRUBIN TOTAL: 0.7 mg/dL (ref 0.3–1.2)
BUN: 16 mg/dL (ref 6–20)
CO2: 25 mmol/L (ref 22–32)
Calcium: 9.5 mg/dL (ref 8.9–10.3)
Chloride: 104 mmol/L (ref 101–111)
Creatinine, Ser: 1.14 mg/dL — ABNORMAL HIGH (ref 0.44–1.00)
GFR calc non Af Amer: 44 mL/min — ABNORMAL LOW (ref >60)
GFR, EST AFRICAN AMERICAN: 51 mL/min — AB (ref >60)
Glucose, Bld: 112 mg/dL — ABNORMAL HIGH (ref 65–99)
POTASSIUM: 3.2 mmol/L — AB (ref 3.5–5.1)
SODIUM: 140 mmol/L (ref 135–145)
Total Protein: 6.6 g/dL (ref 6.5–8.1)

## 2017-01-20 LAB — URINALYSIS, MICROSCOPIC (REFLEX)
BACTERIA UA: NONE SEEN
Squamous Epithelial / LPF: NONE SEEN
WBC UA: NONE SEEN WBC/hpf (ref 0–5)

## 2017-01-20 LAB — URINALYSIS, ROUTINE W REFLEX MICROSCOPIC
Bilirubin Urine: NEGATIVE
Glucose, UA: NEGATIVE mg/dL
Ketones, ur: NEGATIVE mg/dL
LEUKOCYTES UA: NEGATIVE
NITRITE: NEGATIVE
Protein, ur: 100 mg/dL — AB
SPECIFIC GRAVITY, URINE: 1.015 (ref 1.005–1.030)
pH: 6.5 (ref 5.0–8.0)

## 2017-01-20 LAB — CBC WITH DIFFERENTIAL/PLATELET
BASOS ABS: 0 10*3/uL (ref 0.0–0.1)
Basophils Relative: 0 %
Eosinophils Absolute: 0.1 10*3/uL (ref 0.0–0.7)
Eosinophils Relative: 1 %
HCT: 46.1 % — ABNORMAL HIGH (ref 36.0–46.0)
HEMOGLOBIN: 15.5 g/dL — AB (ref 12.0–15.0)
LYMPHS ABS: 1.6 10*3/uL (ref 0.7–4.0)
LYMPHS PCT: 16 %
MCH: 30.8 pg (ref 26.0–34.0)
MCHC: 33.6 g/dL (ref 30.0–36.0)
MCV: 91.7 fL (ref 78.0–100.0)
Monocytes Absolute: 0.8 10*3/uL (ref 0.1–1.0)
Monocytes Relative: 8 %
NEUTROS ABS: 7.5 10*3/uL (ref 1.7–7.7)
NEUTROS PCT: 75 %
Platelets: 228 10*3/uL (ref 150–400)
RBC: 5.03 MIL/uL (ref 3.87–5.11)
RDW: 14.1 % (ref 11.5–15.5)
WBC: 10 10*3/uL (ref 4.0–10.5)

## 2017-01-20 LAB — TSH: TSH: 0.848 u[IU]/mL (ref 0.350–4.500)

## 2017-01-20 LAB — LIPASE, BLOOD: Lipase: 23 U/L (ref 11–51)

## 2017-01-20 LAB — TROPONIN I: Troponin I: <0.03 ng/mL (ref <0.03)

## 2017-01-20 LAB — BRAIN NATRIURETIC PEPTIDE: B Natriuretic Peptide: 312 pg/mL — ABNORMAL HIGH (ref 0.0–100.0)

## 2017-01-20 MED ORDER — PANTOPRAZOLE SODIUM 40 MG PO TBEC
40.0000 mg | DELAYED_RELEASE_TABLET | Freq: Every day | ORAL | Status: DC
  Administered 2017-01-20 – 2017-01-21 (×2): 40 mg via ORAL
  Filled 2017-01-20 (×2): qty 1

## 2017-01-20 MED ORDER — BUSPIRONE HCL 5 MG PO TABS
10.0000 mg | ORAL_TABLET | Freq: Three times a day (TID) | ORAL | Status: DC
  Administered 2017-01-20 – 2017-01-21 (×4): 10 mg via ORAL
  Filled 2017-01-20 (×4): qty 2

## 2017-01-20 MED ORDER — AMLODIPINE BESYLATE 5 MG PO TABS
10.0000 mg | ORAL_TABLET | Freq: Every day | ORAL | Status: DC
  Administered 2017-01-21: 10 mg via ORAL
  Filled 2017-01-20: qty 2

## 2017-01-20 MED ORDER — HYDROCODONE-ACETAMINOPHEN 5-325 MG PO TABS
1.0000 | ORAL_TABLET | ORAL | Status: DC | PRN
  Administered 2017-01-20 – 2017-01-21 (×3): 1 via ORAL
  Filled 2017-01-20 (×4): qty 1

## 2017-01-20 MED ORDER — AMLODIPINE BESYLATE 5 MG PO TABS
10.0000 mg | ORAL_TABLET | Freq: Once | ORAL | Status: AC
  Administered 2017-01-20: 10 mg via ORAL
  Filled 2017-01-20: qty 2

## 2017-01-20 MED ORDER — LISINOPRIL 10 MG PO TABS
40.0000 mg | ORAL_TABLET | Freq: Every day | ORAL | Status: DC
  Administered 2017-01-21: 40 mg via ORAL
  Filled 2017-01-20: qty 4

## 2017-01-20 MED ORDER — POTASSIUM CHLORIDE CRYS ER 20 MEQ PO TBCR
40.0000 meq | EXTENDED_RELEASE_TABLET | Freq: Once | ORAL | Status: AC
  Administered 2017-01-20: 40 meq via ORAL
  Filled 2017-01-20: qty 2

## 2017-01-20 MED ORDER — METOPROLOL SUCCINATE ER 50 MG PO TB24
75.0000 mg | ORAL_TABLET | Freq: Every day | ORAL | Status: DC
  Administered 2017-01-21: 06:00:00 75 mg via ORAL
  Filled 2017-01-20: qty 1

## 2017-01-20 MED ORDER — GABAPENTIN 300 MG PO CAPS
300.0000 mg | ORAL_CAPSULE | Freq: Three times a day (TID) | ORAL | Status: DC
  Administered 2017-01-20 – 2017-01-21 (×4): 300 mg via ORAL
  Filled 2017-01-20 (×4): qty 1

## 2017-01-20 MED ORDER — ONDANSETRON HCL 4 MG/2ML IJ SOLN
4.0000 mg | Freq: Four times a day (QID) | INTRAMUSCULAR | Status: DC
  Administered 2017-01-20 – 2017-01-21 (×2): 4 mg via INTRAVENOUS
  Filled 2017-01-20 (×2): qty 2

## 2017-01-20 MED ORDER — HYDROXYZINE HCL 25 MG PO TABS: 25.0000 mg | ORAL_TABLET | Freq: Three times a day (TID) | ORAL | Status: DC | PRN

## 2017-01-20 MED ORDER — SODIUM CHLORIDE 0.9 % IV SOLN
INTRAVENOUS | Status: AC
  Administered 2017-01-20 – 2017-01-21 (×2): via INTRAVENOUS

## 2017-01-20 MED ORDER — HYDROCODONE-ACETAMINOPHEN 5-325 MG PO TABS
1.0000 | ORAL_TABLET | Freq: Once | ORAL | Status: AC
  Administered 2017-01-20: 1 via ORAL

## 2017-01-20 MED ORDER — ALPRAZOLAM 0.5 MG PO TABS
0.5000 mg | ORAL_TABLET | Freq: Two times a day (BID) | ORAL | Status: DC | PRN
  Administered 2017-01-20: 0.5 mg via ORAL
  Filled 2017-01-20: qty 1

## 2017-01-20 MED ORDER — AMLODIPINE BESYLATE 5 MG PO TABS: 10.0000 mg | ORAL_TABLET | Freq: Every day | ORAL | Status: DC

## 2017-01-20 MED ORDER — LEVOTHYROXINE SODIUM 50 MCG PO TABS
50.0000 ug | ORAL_TABLET | Freq: Every day | ORAL | Status: DC
  Administered 2017-01-21: 50 ug via ORAL
  Filled 2017-01-20: qty 1

## 2017-01-20 MED ORDER — FLECAINIDE ACETATE 50 MG PO TABS
50.0000 mg | ORAL_TABLET | Freq: Once | ORAL | Status: AC
  Administered 2017-01-20: 50 mg via ORAL
  Filled 2017-01-20: qty 1

## 2017-01-20 MED ORDER — METOPROLOL TARTRATE 50 MG PO TABS
50.0000 mg | ORAL_TABLET | Freq: Once | ORAL | Status: AC
  Administered 2017-01-20: 50 mg via ORAL
  Filled 2017-01-20: qty 1

## 2017-01-20 MED ORDER — DABIGATRAN ETEXILATE MESYLATE 150 MG PO CAPS
150.0000 mg | ORAL_CAPSULE | Freq: Two times a day (BID) | ORAL | Status: DC
  Administered 2017-01-21: 150 mg via ORAL
  Filled 2017-01-20 (×2): qty 2
  Filled 2017-01-20 (×2): qty 1
  Filled 2017-01-20: qty 2

## 2017-01-20 MED ORDER — PRAVASTATIN SODIUM 40 MG PO TABS
80.0000 mg | ORAL_TABLET | Freq: Every day | ORAL | Status: DC
  Administered 2017-01-20: 80 mg via ORAL
  Filled 2017-01-20: qty 2

## 2017-01-20 MED ORDER — ACETAMINOPHEN 325 MG PO TABS
650.0000 mg | ORAL_TABLET | ORAL | Status: DC | PRN
  Administered 2017-01-21: 650 mg via ORAL
  Filled 2017-01-20: qty 2

## 2017-01-20 MED ORDER — LISINOPRIL 10 MG PO TABS
40.0000 mg | ORAL_TABLET | Freq: Once | ORAL | Status: AC
  Administered 2017-01-20: 40 mg via ORAL
  Filled 2017-01-20: qty 4

## 2017-01-20 MED ORDER — ONDANSETRON HCL 4 MG/2ML IJ SOLN
4.0000 mg | Freq: Four times a day (QID) | INTRAMUSCULAR | Status: DC | PRN
  Administered 2017-01-20: 4 mg via INTRAVENOUS
  Filled 2017-01-20: qty 2

## 2017-01-20 MED ORDER — LISINOPRIL 10 MG PO TABS: 40.0000 mg | ORAL_TABLET | Freq: Every day | ORAL | Status: DC

## 2017-01-20 MED ORDER — METOPROLOL SUCCINATE ER 50 MG PO TB24
75.0000 mg | ORAL_TABLET | Freq: Once | ORAL | Status: AC
  Administered 2017-01-20: 19:00:00 75 mg via ORAL
  Filled 2017-01-20: qty 1

## 2017-01-20 MED ORDER — LORAZEPAM 2 MG/ML IJ SOLN
1.0000 mg | Freq: Once | INTRAMUSCULAR | Status: AC
  Administered 2017-01-20: 1 mg via INTRAVENOUS
  Filled 2017-01-20: qty 1

## 2017-01-20 MED ORDER — POTASSIUM CHLORIDE CRYS ER 20 MEQ PO TBCR
40.0000 meq | EXTENDED_RELEASE_TABLET | ORAL | Status: AC
  Administered 2017-01-20 (×2): 40 meq via ORAL
  Filled 2017-01-20 (×2): qty 2

## 2017-01-20 MED ORDER — DULOXETINE HCL 60 MG PO CPEP
60.0000 mg | ORAL_CAPSULE | Freq: Two times a day (BID) | ORAL | Status: DC
  Administered 2017-01-20 – 2017-01-21 (×2): 60 mg via ORAL
  Filled 2017-01-20 (×2): qty 1

## 2017-01-20 NOTE — ED Notes (Signed)
Pt is increasingly short of breath with standing and heart rate went up to 180 in atrial fib, edp notified

## 2017-01-20 NOTE — ED Notes (Signed)
Attempted report x1. 

## 2017-01-20 NOTE — ED Triage Notes (Signed)
Pt states she was seen at her doctors office for n/v yesterday. Sometime in the night she began having shortness of breath. Pt is alert and oriented in triage.

## 2017-01-20 NOTE — H&P (Signed)
History and Physical    Kelsey Sandoval:[id number] DOB: [birth year] DOA: 01/20/2017  PCP: Sallee Lange, MD  Patient coming from: home  I have personally briefly reviewed patient's old medical records in [place]  Chief Complaint: nausea  HPI: Kelsey Sandoval is a 83 y.o. female with medical history significant of atrial fibrillation on anticoagulation, chronic back pain, anxiety, chronic kidney disease stage III, hypertension. Patient has had long-term issues with nausea and has been extensively worked up in the past. She did see her primary care physician yesterday, who's note indicates that patient chronically has had intermittent episodes of nausea that typically lasts a few weeks. She has been worked up in the past with gastric emptying studies and HIDA scans. Workup has been unrevealing. She reports her current bout of nausea lasting for the past 2 weeks with decreased by mouth intake. She's not had any vomiting. She's not had any diarrhea. Today, she did notice some shortness of breath, but felt this may be related to a panic attack. She has not had any fever or cough. No dysuria.  ED Course: In the emergency room, she was noted to be very anxious and was given a dose of IV Ativan. She was restarted on her usual blood pressure medicine since she was noted to be markedly hypertensive. Upon standing, blood pressure did decrease, but heart rate increased to 170s to 180s and she complained of shortness of breath. She's been referred for admission.  Review of Systems: As per HPI otherwise 10 point review of systems negative.    Past Medical History:  Diagnosis Date  . Anxiety   . Arthritis   . Back pain, chronic    Sciatica  . Cataracts, bilateral   . Depression   . Essential hypertension   . Hyperlipidemia   . Membranous nephropathy determined by biopsy 06/07/2015  . Nephrotic syndrome    RHUX  . Neuropathy   . Osteopenia   . Pacemaker    Medtronic  . PAF (paroxysmal  atrial fibrillation) ([place])   . Pre-diabetes   . Tachycardia-bradycardia syndrome [facility])     Past Surgical History:  Procedure Laterality Date  . ABDOMINAL HYSTERECTOMY     partial-pt has no ovaries  . APPENDECTOMY    . BACK SURGERY    . BIOPSY N/A 01/17/2014   Procedure: BIOPSY;  Surgeon: Rogene Houston, MD;  Location[facility];  Service: Endoscopy;  Laterality: N/A;  . CATARACT EXTRACTION W/PHACO Left 02/28/2015   Procedure: CATARACT EXTRACTION PHACO AND INTRAOCULAR LENS PLACEMENT (IOC);  Surgeon: [name], MD;  Location: [facility] ORS;  Service: Ophthalmology;  Laterality: Left;  CDE 18.71  . COLONOSCOPY  9/05  . COLONOSCOPY N/A 10/11/2013   Procedure: COLONOSCOPY;  Surgeon: Rogene Houston, MD;  Location: [facility] ENDO SUITE;  Service: Endoscopy;  Laterality: N/A;  1200  . ESOPHAGOGASTRODUODENOSCOPY N/A 01/17/2014   Procedure: ESOPHAGOGASTRODUODENOSCOPY (EGD);  Surgeon: Rogene Houston, MD;  Location: [facility];  Service: Endoscopy;  Laterality: N/A;  230  . ESOPHAGOGASTRODUODENOSCOPY N/A 01/02/2016   Procedure: ESOPHAGOGASTRODUODENOSCOPY (EGD);  Surgeon: Rogene Houston, MD;  Location: [facility];  Service: Endoscopy;  Laterality: N/A;  240  . INSERT / REPLACE / REMOVE PACEMAKER     2012  . Wisdom tooth extracton       reports that she has never smoked. She has never used smokeless tobacco. She reports that she does not drink alcohol or use drugs.  Allergies  Allergen Reactions  .  Penicillins Other (See Comments)    Caused patient to pass out.Can take cephalosporins Has patient had a PCN reaction causing immediate rash, facial/tongue/throat swelling, SOB or lightheadedness with hypotension: no Has patient had a PCN reaction causing severe rash involving mucus membranes or skin necrosis: No Has patient had a PCN reaction that required hospitalization No Has patient had a PCN reaction occurring within the last 10 years: No If all of the above answers are "NO", then may proceed with  Cephalosporin use.   . Levaquin [Levofloxacin] Nausea Only  . Sulfa Antibiotics Other (See Comments)    Unknown  . Zithromax [Azithromycin] Nausea Only and Rash    Family History  Problem Relation Age of Onset  . Colon cancer Brother   . Anxiety disorder Sister   . Depression Sister   . Anxiety disorder Sister   . Depression Sister      Prior to Admission medications   Medication Sig Start Date End Date Taking? Authorizing Provider  acetaminophen (TYLENOL) 650 MG CR tablet Take 1,300 mg by mouth every 8 (eight) hours as needed for pain.   Yes Historical Provider, MD  albuterol (PROVENTIL HFA;VENTOLIN HFA) 108 (90 BASE) MCG/ACT inhaler Inhale 2 puffs into the lungs every 6 (six) hours as needed for wheezing or shortness of breath. 09/17/14  Yes Kathyrn Drown, MD  ALPRAZolam Duanne Moron) 1 MG tablet 1 qhs prn insomnia Patient taking differently: Take 1 mg by mouth at bedtime as needed for sleep. 1 qhs prn insomnia 11/09/16  Yes Kathyrn Drown, MD  amLODipine (NORVASC) 10 MG tablet Take 1 tablet (10 mg total) by mouth daily. 01/04/17  Yes Kathyrn Drown, MD  busPIRone (BUSPAR) 10 MG tablet Take 1 tablet (10 mg total) by mouth 3 (three) times daily. 10/27/16  Yes Cloria Spring, MD  dabigatran (PRADAXA) 150 MG CAPS capsule Take 1 capsule (150 mg total) by mouth 2 (two) times daily. 07/03/16  Yes Herminio Commons, MD  DULoxetine (CYMBALTA) 60 MG capsule Take 1 capsule (60 mg total) by mouth 2 (two) times daily. 10/27/16 10/27/17 Yes Cloria Spring, MD  flecainide (TAMBOCOR) 50 MG tablet Take 1 tablet (50 mg total) by mouth 2 (two) times daily. 07/03/16  Yes Herminio Commons, MD  gabapentin (NEURONTIN) 300 MG capsule TAKE ONE CAPSULE BY MOUTH THREE TIMES DAILY 12/21/16  Yes Kathyrn Drown, MD  HYDROcodone-acetaminophen (NORCO/VICODIN) 5-325 MG tablet Take 1 tablet by mouth every 4 (four) hours as needed. 11/09/16  Yes Kathyrn Drown, MD  hydrOXYzine (ATARAX/VISTARIL) 10 MG tablet One bid prn nausea,  caution drowsiness 01/19/17  Yes Kathyrn Drown, MD  levothyroxine (SYNTHROID, LEVOTHROID) 50 MCG tablet TAKE ONE TABLET BY MOUTH ONCE DAILY **CHANGE  IN  DOSE** 01/28/16  Yes Kathyrn Drown, MD  lisinopril (PRINIVIL,ZESTRIL) 40 MG tablet Take 1 tablet (40 mg total) by mouth daily. 01/19/17  Yes Kathyrn Drown, MD  ondansetron (ZOFRAN-ODT) 8 MG disintegrating tablet Take 1 tablet (8 mg total) by mouth every 8 (eight) hours as needed for nausea or vomiting. 10/06/16  Yes Kathyrn Drown, MD  pantoprazole (PROTONIX) 40 MG tablet Take 1 tablet (40 mg total) by mouth daily. Reported on 01/28/2016 01/28/16  Yes Kathyrn Drown, MD  pravastatin (PRAVACHOL) 80 MG tablet Take 1 tablet (80 mg total) by mouth daily. 01/19/17  Yes Kathyrn Drown, MD  vitamin B-12 (CYANOCOBALAMIN) 1000 MCG tablet Take 1 tablet (1,000 mcg total) by mouth daily. 03/11/15  Yes Jolaine Artist  Jocelyn Lowery, MD  metoprolol succinate (TOPROL-XL) 50 MG 24 hr tablet Take 1 tablet (50 mg total) by mouth daily. Take with or immediately following a meal. 01/04/17   Kathyrn Drown, MD    Physical Exam: Vitals:   01/20/17 1230 01/20/17 1300 01/20/17 1330 01/20/17 1442  BP: (!) 170/106 (!) 179/104 (!) 173/96 (!) 160/90  Pulse: 79 82 89 79  Resp: 20 (!) 21 (!) 25   Temp:    98.6 F (37 C)  TempSrc:    Oral  SpO2: 98% 96% 96% 97%  Weight:      Height:        Constitutional: NAD, calm, comfortable Vitals:   01/20/17 1230 01/20/17 1300 01/20/17 1330 01/20/17 1442  BP: (!) 170/106 (!) 179/104 (!) 173/96 (!) 160/90  Pulse: 79 82 89 79  Resp: 20 (!) 21 (!) 25   Temp:    98.6 F (37 C)  TempSrc:    Oral  SpO2: 98% 96% 96% 97%  Weight:      Height:       Eyes: PERRL, lids and conjunctivae normal ENMT: Mucous membranes are moist. Posterior pharynx clear of any exudate or lesions.Normal dentition.  Neck: normal, supple, no masses, no thyromegaly Respiratory: clear to auscultation bilaterally, no wheezing, no crackles. Normal respiratory effort. No accessory  muscle use.  Cardiovascular: irregular. No extremity edema. 2+ pedal pulses. No carotid bruits.  Abdomen: no tenderness, no masses palpated. No hepatosplenomegaly. Bowel sounds positive.  Musculoskeletal: no clubbing / cyanosis. No joint deformity upper and lower extremities. Good ROM, no contractures. Normal muscle tone.  Skin: no rashes, lesions, ulcers. No induration Neurologic: CN 2-12 grossly intact. Sensation intact, DTR normal. Strength 5/5 in all 4.  Psychiatric: Normal judgment and insight. Alert and oriented x 3. Normal mood.    Labs on Admission: I have personally reviewed following labs and imaging studies  CBC:  Recent Labs Lab 01/20/17 0925  WBC 10.0  NEUTROABS 7.5  HGB 15.5*  HCT 46.1*  MCV 91.7  PLT 353   Basic Metabolic Panel:  Recent Labs Lab 01/20/17 0925  NA 140  K 3.2*  CL 104  CO2 25  GLUCOSE 112*  BUN 16  CREATININE 1.14*  CALCIUM 9.5   GFR: Estimated Creatinine Clearance: 41.9 mL/min (A) (by C-G formula based on SCr of 1.14 mg/dL (H)). Liver Function Tests:  Recent Labs Lab 01/20/17 0925  AST 39  ALT 20  ALKPHOS 57  BILITOT 0.7  PROT 6.6  ALBUMIN 3.8    Recent Labs Lab 01/20/17 0925  LIPASE 23   No results for input(s): AMMONIA in the last 168 hours. Coagulation Profile: No results for input(s): INR, PROTIME in the last 168 hours. Cardiac Enzymes:  Recent Labs Lab 01/20/17 0925  TROPONINI <0.03   BNP (last 3 results) No results for input(s): PROBNP in the last 8760 hours. HbA1C: No results for input(s): HGBA1C in the last 72 hours. CBG: No results for input(s): GLUCAP in the last 168 hours. Lipid Profile: No results for input(s): CHOL, HDL, LDLCALC, TRIG, CHOLHDL, LDLDIRECT in the last 72 hours. Thyroid Function Tests: No results for input(s): TSH, T4TOTAL, FREET4, T3FREE, THYROIDAB in the last 72 hours. Anemia Panel: No results for input(s): VITAMINB12, FOLATE, FERRITIN, TIBC, IRON, RETICCTPCT in the last 72  hours. Urine analysis:    Component Value Date/Time   COLORURINE YELLOW 01/20/2017 1130   APPEARANCEUR CLEAR 01/20/2017 1130   LABSPEC 1.015 01/20/2017 1130   PHURINE 6.5 01/20/2017 1130  GLUCOSEU NEGATIVE 01/20/2017 1130   HGBUR TRACE (A) 01/20/2017 1130   BILIRUBINUR NEGATIVE 01/20/2017 1130   BILIRUBINUR 3+ 01/14/2016 1036   KETONESUR NEGATIVE 01/20/2017 1130   PROTEINUR 100 (A) 01/20/2017 1130   UROBILINOGEN negative 01/14/2016 1036   UROBILINOGEN 0.2 03/07/2015 2005   NITRITE NEGATIVE 01/20/2017 1130   LEUKOCYTESUR NEGATIVE 01/20/2017 1130    Radiological Exams on Admission: Dg Chest Portable 1 View  Result Date: 01/20/2017 CLINICAL DATA:  83 year old female with shortness of breath since last night. EXAM: PORTABLE CHEST 1 VIEW COMPARISON:  10/13/2016 and earlier. FINDINGS: Portable AP upright view at 0906 hours. Stable right chest stool lead cardiac pacemaker. Stable lung volumes. Stable cardiac size and mediastinal contours. Calcified aortic atherosclerosis. No pneumothorax, pulmonary edema, pleural effusion or confluent pulmonary opacity. Visualized tracheal air column is within normal limits. IMPRESSION: No acute cardiopulmonary abnormality. Calcified aortic atherosclerosis. Electronically Signed   By: Genevie Ann M.D.   On: 01/20/2017 09:19   Dg Abd 2 Views  Result Date: 01/20/2017 CLINICAL DATA:  Nausea. EXAM: ABDOMEN - 2 VIEW COMPARISON:  03/07/2015 FINDINGS: The bowel gas pattern is normal. There is no evidence of free air. No radio-opaque calculi or other significant radiographic abnormality is seen. IMPRESSION: Nonobstructive bowel gas pattern. Electronically Signed   By: Kerby Moors M.D.   On: 01/20/2017 11:00    EKG: Independently reviewed. Atrial fibrillation  Assessment/Plan Active Problems:   Tachycardia-bradycardia syndrome (HCC)   Systemic hypertension   Pacemaker   Hypothyroidism   Chronic pain syndrome   Chronic kidney disease (CKD), stage III  (moderate)   Atrial fibrillation with RVR (HCC)   Nausea   Anxiety     1. Atrial fibrillation with rapid ventricular response. Patient's metoprolol dose has been increased. Overall heart rate is better. Check TSH and cardiac enzymes. Cardiology is following. She is anticoagulated with pradaxa. Continue monitor heart rate overnight.  2. Tachybradycardia syndrome. Status post pacemaker. Interrogation of pacemaker indicates that she is chronically in atrial fibrillation. Cardiology has recommended to stop flecainide.  3. Chronic kidney disease stage III. Likely related to hypertension. Creatinine appears to be at baseline.  4. Uncontrolled hypertension. Restart her home medication with some improvement of blood pressure. Continue to monitor.  5. Hypothyroidism. Continue Synthroid.  6. Anxiety. Likely contributing to the majority of her symptoms. She is chronically on Xanax and has recently been prescribed Vistaril. Recommend further follow-up with primary care physician/psychiatrist.  7. Nausea. Abdominal x-rays unremarkable. LFTs unremarkable. Appears to be a chronic issue. Continue antiemetics.  8. Chronic pain syndrome. Continue home dose of hydrocodone. Plans to follow-up with orthopedics in [facility] for chronic knee pain.  DVT prophylaxis: pradaxa Code Status: DNR Family Communication: discussed with son over the phone Disposition Plan: discharge home when improved Consults called: cardiology Admission status: observation/ telemetry   Franz Svec MD [facility] Pager [phone]  If 7PM-7AM, please contact night-coverage [email or web address] Password [facility]  01/20/2017, 5:33 PM

## 2017-01-20 NOTE — Consult Note (Addendum)
Primary cardiologist: Dr Kate Sable Consulting cardiologist: Dr Carlyle Dolly  Requesting physician: Dr Thurnell Garbe Indication: Tachcyardia   Clinical Summary Kelsey Sandoval is a 83 y.o.female with history of afib. Previous notes mention PAF, however reviewing her most recent EKGs and pacemaker checks appears she is 100% in afib. History of tachy-brady syndrome, has pacemaker, HTN as well. Based on records she has had issues with medication compliance admitted with nausea and generalized weakenss/fatigue. In ER found to be severely hypertensive, tachycardia in afib with RVR.   She reports for the last several days progressing weakness and fatigue. Nausea but no vomiting, no diarrhea. Poor oral intake. Has not taken her meds x 2 days.    K 3.2, Cr 1.14, BUN 16, WBC 10, 15.5, BNP 312 Trop neg CXR no acute process EKG afib      Allergies  Allergen Reactions  . Penicillins Other (See Comments)    Caused patient to pass out.Can take cephalosporins Has patient had a PCN reaction causing immediate rash, facial/tongue/throat swelling, SOB or lightheadedness with hypotension: no Has patient had a PCN reaction causing severe rash involving mucus membranes or skin necrosis: No Has patient had a PCN reaction that required hospitalization No Has patient had a PCN reaction occurring within the last 10 years: No If all of the above answers are "NO", then may proceed with Cephalosporin use.   . Levaquin [Levofloxacin] Nausea Only  . Sulfa Antibiotics Other (See Comments)    Unknown  . Zithromax [Azithromycin] Nausea Only and Rash    Medications Scheduled Medications: . potassium chloride SA  40 mEq Oral Once     Infusions:   PRN Medications:     Past Medical History:  Diagnosis Date  . Anxiety   . Arthritis   . Back pain, chronic    Sciatica  . Cataracts, bilateral   . Depression   . Essential hypertension   . Hyperlipidemia   . Membranous nephropathy determined by  biopsy 06/07/2015  . Nephrotic syndrome    RHUX  . Neuropathy   . Osteopenia   . Pacemaker    Medtronic  . PAF (paroxysmal atrial fibrillation) ([place])   . Pre-diabetes   . Tachycardia-bradycardia syndrome [facility])     Past Surgical History:  Procedure Laterality Date  . ABDOMINAL HYSTERECTOMY     partial-pt has no ovaries  . APPENDECTOMY    . BACK SURGERY    . BIOPSY N/A 01/17/2014   Procedure: BIOPSY;  Surgeon: Rogene Houston, MD;  [facility];  Service: Endoscopy;  Laterality: N/A;  . CATARACT EXTRACTION W/PHACO Left 02/28/2015   Procedure: CATARACT EXTRACTION PHACO AND INTRAOCULAR LENS PLACEMENT (IOC);  Surgeon: Tonny Arvind Mexicano, MD;  Location: [facility] ORS;  Service: Ophthalmology;  Laterality: Left;  CDE 18.71  . COLONOSCOPY  9/05  . COLONOSCOPY N/A 10/11/2013   Procedure: COLONOSCOPY;  Surgeon: Rogene Houston, MD;  Location[facility];  Service: Endoscopy;  Laterality: N/A;  1200  . ESOPHAGOGASTRODUODENOSCOPY N/A 01/17/2014   Procedure: ESOPHAGOGASTRODUODENOSCOPY (EGD);  Surgeon: Rogene Houston, MD;  [facility];  Service: Endoscopy;  Laterality: N/A;  230  . ESOPHAGOGASTRODUODENOSCOPY N/A 01/02/2016   Procedure: ESOPHAGOGASTRODUODENOSCOPY (EGD);  Surgeon: Rogene Houston, MD;  Location[facility];  Service: Endoscopy;  Laterality: N/A;  240  . INSERT / REPLACE / REMOVE PACEMAKER     2012  . Wisdom tooth extracton      Family History  Problem Relation Age of Onset  .  Colon cancer Brother   . Anxiety disorder Sister   . Depression Sister   . Anxiety disorder Sister   . Depression Sister     Social History Kelsey Sandoval reports that she has never smoked. She has never used smokeless tobacco. Kelsey Sandoval reports that she does not drink alcohol.  Review of Systems CONSTITUTIONAL: No weight loss, fever, chills, weakness or fatigue.  HEENT: Eyes: No visual loss, blurred vision, double vision or yellow sclerae. No hearing loss, sneezing, congestion, runny nose or  sore throat.  SKIN: No rash or itching.  CARDIOVASCULAR: occasional palpitations.  RESPIRATORY: No shortness of breath, cough or sputum.  GASTROINTESTINAL: No anorexia, nausea, vomiting or diarrhea. No abdominal pain or blood.  GENITOURINARY: no polyuria, no dysuria NEUROLOGICAL: No headache, dizziness, syncope, paralysis, ataxia, numbness or tingling in the extremities. No change in bowel or bladder control.  MUSCULOSKELETAL: No muscle, back pain, joint pain or stiffness.  HEMATOLOGIC: No anemia, bleeding or bruising.  LYMPHATICS: No enlarged nodes. No history of splenectomy.  PSYCHIATRIC: No history of depression or anxiety.      Physical Examination Blood pressure (!) 160/115, pulse (!) 124, temperature 98 F (36.7 C), temperature source Oral, resp. rate (!) 26, height 5\' 10"  (1.778 m), weight 160 lb (72.6 kg), SpO2 91 %. No intake or output data in the 24 hours ending 01/20/17 1242  HEENT: sclera clear, throat clear  Cardiovascular: irreg, no m/r/g, no jvd  Respiratory: CTAB  GI: abdomen soft, NT, ND  MSK: no LE edema  Neuro: no focal deficits  Psych: appropriate affect   Lab Results  Basic Metabolic Panel:  Recent Labs Lab 01/20/17 0925  NA 140  K 3.2*  CL 104  CO2 25  GLUCOSE 112*  BUN 16  CREATININE 1.14*  CALCIUM 9.5    Liver Function Tests:  Recent Labs Lab 01/20/17 0925  AST 39  ALT 20  ALKPHOS 57  BILITOT 0.7  PROT 6.6  ALBUMIN 3.8    CBC:  Recent Labs Lab 01/20/17 0925  WBC 10.0  NEUTROABS 7.5  HGB 15.5*  HCT 46.1*  MCV 91.7  PLT 228    Cardiac Enzymes:  Recent Labs Lab 01/20/17 0925  TROPONINI <0.03    BNP: Invalid input(s): POCBNP   ECG   Imaging   Impression/Recommendations 1. Afib - previous EKGs and pacemaker checks show is in afib 100% of time - stop flecanide. Continue rate control. Rates down to 90s after getting lopressor in ER. Still with positional tachycardia, unclear if due to uncontrolled afib  or significant orthostatic tachycardia due to hypovolemia. Follow rates on her home Toprol and with IVFs, repeat orthostatics tomorrow.  - continue pradaxa. CHADS2Vasc score 4.    2. Probable hypovolemia - hemoconcentrated. Orthostatic by heart rate, though may be compouned by her afib - gentle IVFs, repeat orthostatics in AM. According to prior notes her bp regiment has been limited due to dizziness  3. Weakness/fatigue/nausea - eval per internal medicine.   4. HTN urgency - severely elevated on admission, likely due to lack of compliance - follow bp's on her home regimen   Carlyle Dolly, M.D.

## 2017-01-20 NOTE — ED Notes (Signed)
Patient very anxious, tearful and says she breaks out in cold sweats. States she is hurting in her knees and hips. Feels as if she is having panic attacks. [place]ado upset her and she stayed with daughter. Saw Dr. Wolfgang Phoenix on April 2 for knee pain. Rx for ondansetran and vistaril given.

## 2017-01-20 NOTE — ED Provider Notes (Signed)
[facility] DEPT Provider Note   CSN: [id number] Arrival date & time: 01/20/17  0848     History   Chief Complaint Chief Complaint  Patient presents with  . Shortness of Breath  . Nausea    x2.5 weeks    HPI Kelsey Sandoval is a 83 y.o. female.  HPI  Pt was seen at 0900. Per pt, c/o gradual onset and persistence of constant nausea for the past 2.5 weeks. Pt also states she has had intermittent CP and SOB, but cannot be more specific as to these symptoms (onset, duration, description). Pt also endorses "anxiety." Pt was evaluated by her PMD yesterday for her symptoms, rx hydroxyzine. Unclear if pt has filled/taken this prescription. Denies vomiting/diarrhea, no abd pain, no palpitations, no cough, no back pain, no fevers.    Past Medical History:  Diagnosis Date  . Anxiety   . Arthritis   . Back pain, chronic    Sciatica  . Cataracts, bilateral   . Depression   . Essential hypertension   . Hyperlipidemia   . Membranous nephropathy determined by biopsy 06/07/2015  . Nephrotic syndrome    RHUX  . Neuropathy   . Osteopenia   . Pacemaker    [employer]  . PAF (paroxysmal atrial fibrillation) ([place])   . Pre-diabetes   . Tachycardia-bradycardia syndrome [facility])     Patient Active Problem List   Diagnosis Date Noted  . Lobar pneumonia, unspecified organism ([place]) 09/11/2016  . Acute bronchitis 09/11/2016  . Chronic anticoagulation 09/07/2016  . Chronic kidney disease (CKD), stage III (moderate) 09/07/2016  . Hereditary and idiopathic peripheral neuropathy 03/05/2016  . Major depression 12/04/2015  . Major depression in remission ([place]) 09/02/2015  . Membranous nephropathy determined by biopsy 06/07/2015  . Osteoarthritis of both knees 03/26/2015  . Chronic pain syndrome 03/26/2015  . Intractable nausea and vomiting   . Nausea with vomiting   . Malnutrition of moderate degree ([place]) 03/08/2015  . CAP (community acquired pneumonia) 03/08/2015  . UTI (lower urinary tract  infection) 03/07/2015  . Hypothyroidism 02/14/2015  . Disorder of kidney 01/17/2015  . Nephrotic syndrome 06/21/2014  . Proteinuria 03/29/2014  . Osteopenia 03/29/2014  . Anemia, iron deficiency 08/22/2013  . Mitral insufficiency 06/11/2013  . Pacemaker 06/11/2013  . Paroxysmal atrial fibrillation ([place]) 09/26/2012  . Tachycardia-bradycardia syndrome ([place]) 09/26/2012  . Sick sinus syndrome ([place]) 09/26/2012  . Systemic hypertension 09/26/2012  . Dyslipidemia 09/26/2012    Past Surgical History:  Procedure Laterality Date  . ABDOMINAL HYSTERECTOMY     partial-pt has no ovaries  . APPENDECTOMY    . BACK SURGERY    . BIOPSY N/A 01/17/2014   Procedure: BIOPSY;  Surgeon: Rogene Houston, MD;  Location: [facility] ENDO SUITE;  Service: Endoscopy;  Laterality: N/A;  . CATARACT EXTRACTION W/PHACO Left 02/28/2015   Procedure: CATARACT EXTRACTION PHACO AND INTRAOCULAR LENS PLACEMENT (IOC);  Surgeon: [name], MD;  Location: [facility] ORS;  Service: Ophthalmology;  Laterality: Left;  CDE 18.71  . COLONOSCOPY  9/05  . COLONOSCOPY N/A 10/11/2013   Procedure: COLONOSCOPY;  Surgeon: Rogene Houston, MD;  Location: [facility] ENDO SUITE;  Service: Endoscopy;  Laterality: N/A;  1200  . ESOPHAGOGASTRODUODENOSCOPY N/A 01/17/2014   Procedure: ESOPHAGOGASTRODUODENOSCOPY (EGD);  Surgeon: Rogene Houston, MD;  Location: [facility] END[facility] SUITE;  Service: Endoscopy;  Laterality: N/A;  230  . ESOPHAGOGASTRODUODENOSCOPY N/A 01/02/2016   Procedure: ESOPHAGOGASTRODUODENOSCOPY (EGD);  Surgeon: Rogene Houston, MD;  Location: [facility] ENDO SUITE;  Service: Endoscopy;  Laterality: N/A;  [place] / REPLACE / REMOVE PACEMAKER     2012  . Wisdom tooth extracton      OB History    Gravida Para Term Preterm AB Living   2 2 2          SAB TAB Ectopic Multiple Live Births                   Home Medications    Prior to Admission medications   Medication Sig Start Date End Date Taking? Authorizing Provider  acetaminophen (TYLENOL) 650 MG CR  tablet Take 1,300 mg by mouth every 8 (eight) hours as needed for pain.    Historical Provider, MD  albuterol (PROVENTIL HFA;VENTOLIN HFA) 108 (90 BASE) MCG/ACT inhaler Inhale 2 puffs into the lungs every 6 (six) hours as needed for wheezing or shortness of breath. 09/17/14   Kathyrn Drown, MD  ALPRAZolam Duanne Moron) 1 MG tablet 1 qhs prn insomnia Patient taking differently: Take 1 mg by mouth at bedtime as needed for sleep. 1 qhs prn insomnia 11/09/16   Kathyrn Drown, MD  amLODipine (NORVASC) 10 MG tablet Take 1 tablet (10 mg total) by mouth daily. 01/04/17   Kathyrn Drown, MD  busPIRone (BUSPAR) 10 MG tablet Take 1 tablet (10 mg total) by mouth 3 (three) times daily. 10/27/16   Cloria Spring, MD  dabigatran (PRADAXA) 150 MG CAPS capsule Take 1 capsule (150 mg total) by mouth 2 (two) times daily. 07/03/16   Herminio Commons, MD  DULoxetine (CYMBALTA) 60 MG capsule Take 1 capsule (60 mg total) by mouth 2 (two) times daily. 10/27/16 10/27/17  Cloria Spring, MD  flecainide (TAMBOCOR) 50 MG tablet Take 1 tablet (50 mg total) by mouth 2 (two) times daily. 07/03/16   Herminio Commons, MD  gabapentin (NEURONTIN) 300 MG capsule TAKE ONE CAPSULE BY MOUTH THREE TIMES DAILY 12/21/16   Kathyrn Drown, MD  HYDROcodone-acetaminophen (NORCO/VICODIN) 5-325 MG tablet Take 1 tablet by mouth every 4 (four) hours as needed. 11/09/16   Kathyrn Drown, MD  hydrOXYzine (ATARAX/VISTARIL) 10 MG tablet One bid prn nausea, caution drowsiness 01/19/17   Kathyrn Drown, MD  levothyroxine (SYNTHROID, LEVOTHROID) 50 MCG tablet TAKE ONE TABLET BY MOUTH ONCE DAILY **CHANGE  IN  DOSE** 01/28/16   Kathyrn Drown, MD  lisinopril (PRINIVIL,ZESTRIL) 40 MG tablet Take 1 tablet (40 mg total) by mouth daily. 01/19/17   Kathyrn Drown, MD  metoprolol succinate (TOPROL-XL) 50 MG 24 hr tablet Take 1 tablet (50 mg total) by mouth daily. Take with or immediately following a meal. 01/04/17   Kathyrn Drown, MD  ondansetron (ZOFRAN-ODT) 8 MG disintegrating  tablet Take 1 tablet (8 mg total) by mouth every 8 (eight) hours as needed for nausea or vomiting. 10/06/16   Kathyrn Drown, MD  pantoprazole (PROTONIX) 40 MG tablet Take 1 tablet (40 mg total) by mouth daily. Reported on 01/28/2016 01/28/16   Kathyrn Drown, MD  pravastatin (PRAVACHOL) 80 MG tablet Take 1 tablet (80 mg total) by mouth daily. 01/19/17   Kathyrn Drown, MD  vitamin B-12 (CYANOCOBALAMIN) 1000 MCG tablet Take 1 tablet (1,000 mcg total) by mouth daily. 03/11/15   Kathie Dike, MD    Family History Family History  Problem Relation Age of Onset  . Colon cancer Brother   . Anxiety disorder Sister   . Depression Sister   . Anxiety disorder Sister   . Depression Sister  Social History Social History  Substance Use Topics  . Smoking status: Never Smoker  . Smokeless tobacco: Never Used  . Alcohol use No     Allergies   Penicillins; Levaquin [levofloxacin]; Sulfa antibiotics; and Zithromax [azithromycin]   Review of Systems Review of Systems ROS: Statement: All systems negative except as marked or noted in the HPI; Constitutional: Negative for fever and chills. ; ; Eyes: Negative for eye pain, redness and discharge. ; ; ENMT: Negative for ear pain, hoarseness, nasal congestion, sinus pressure and sore throat. ; ; Cardiovascular: +CP, SOB. Negative for palpitations, diaphoresis, and peripheral edema. ; ; Respiratory: Negative for cough, wheezing and stridor. ; ; Gastrointestinal: +nausea. Negative for vomiting, diarrhea, abdominal pain, blood in stool, hematemesis, jaundice and rectal bleeding. . ; ; Genitourinary: Negative for dysuria, flank pain and hematuria. ; ; Musculoskeletal: Negative for back pain and neck pain. Negative for swelling and trauma.; ; Skin: Negative for pruritus, rash, abrasions, blisters, bruising and skin lesion.; ; Neuro: Negative for headache, lightheadedness and neck stiffness. Negative for weakness, altered level of consciousness, altered mental status,  extremity weakness, paresthesias, involuntary movement, seizure and syncope.; Psych:  +anxiety. No SI, no SA, no HI, no hallucinations.       Physical Exam Updated Vital Signs BP (!) 205/114 (BP Location: Left Arm)   Pulse 76   Temp 98 F (36.7 C) (Oral)   Resp (!) 24   Ht 5\' 10"  (1.778 m)   Wt 160 lb (72.6 kg)   SpO2 97%   BMI 22.96 kg/m   12:00:48 Orthostatic Vital Signs CC  Orthostatic Lying   BP- Lying:  160/115  Pulse- Lying: 126      Orthostatic Sitting  BP- Sitting:  172/136  Pulse- Sitting: 145      Orthostatic Standing at 0 minutes  BP- Standing at 0 minutes:  158/94  Pulse- Standing at 0 minutes: 177    Patient Vitals for the past 24 hrs:  BP Temp Temp src Pulse Resp SpO2 Height Weight  01/20/17 1230 (!) 170/106 - - 79 20 98 % - -  01/20/17 1200 (!) 160/115 - - (!) 124 (!) 26 91 % - -  01/20/17 1130 (!) 196/138 - - - (!) 24 - - -  01/20/17 1100 (!) 183/125 - - (!) 129 15 95 % - -  01/20/17 1005 (!) 178/110 - - 98 14 96 % - -  01/20/17 0900 (!) 147/97 - - (!) 107 19 98 % - -  01/20/17 0856 (!) 205/114 98 F (36.7 C) Oral 76 (!) 24 97 % - -  01/20/17 0853 - - - - - - 5\' 10"  (1.778 m) 160 lb (72.6 kg)      Physical Exam 0905: Physical examination:  Nursing notes reviewed; Vital signs and O2 SAT reviewed;  Constitutional: Well developed, Well nourished, Well hydrated, In no acute distress; Head:  Normocephalic, atraumatic; Eyes: EOMI, PERRL, No scleral icterus; ENMT: Mouth and pharynx normal, Mucous membranes moist; Neck: Supple, Full range of motion, No lymphadenopathy; Cardiovascular: Irregular rate and rhythm, No gallop; Respiratory: Breath sounds clear & equal bilaterally, No wheezes.  Speaking full sentences with ease, Normal respiratory effort/excursion; Chest: Nontender, Movement normal; Abdomen: Soft, Nontender, Nondistended, Normal bowel sounds; Genitourinary: No CVA tenderness; Extremities: Pulses normal, No tenderness, No edema, No calf edema or  asymmetry.; Neuro: AA&Ox3, vague historian. Major CN grossly intact.  Speech clear. No gross focal motor or sensory deficits in extremities.; Skin: Color normal, Warm, Dry.; Psych:  Affect flat, poor eye contact. HPI told to me with her eyes closed and head turned away, will occasionally open one eyelid slightly and look at me, then close eyes again.     ED Treatments / Results  Labs (all labs ordered are listed, but only abnormal results are displayed)   EKG  EKG Interpretation  Date/Time:  Wednesday January 20 2017 08:55:48 EDT Ventricular Rate:  113 PR Interval:    QRS Duration: 97 QT Interval:  311 QTC Calculation: 458 R Axis:   38 Text Interpretation:  Afib/flut and V-paced complexes No further rhythm analysis attempted due to paced rhythm Repol abnrm suggests ischemia, diffuse leads Baseline wander Artifact SUGGEST REPEAT TRACING Confirmed by [facility]  MD, Nunzio Cory [phone]) on 01/20/2017 9:27:16 AM        EKG Interpretation  Date/Time:  Wednesday January 20 2017 10:04:41 EDT Ventricular Rate:  104 PR Interval:    QRS Duration: 92 QT Interval:  324 QTC Calculation: 427 R Axis:   46 Text Interpretation:  Atrial fibrillation Ventricular premature complex Nonspecific repol abnormality, diffuse leads When compared with ECG of 01/01/2017 No significant change was found Confirmed by [facility]  MD, Nunzio Cory [phone]) on 01/20/2017 11:02:23 AM         Radiology   Procedures Procedures (including critical care time)  Medications Ordered in ED Medications  LORazepam (ATIVAN) injection 1 mg (not administered)     Initial Impression / Assessment and Plan / ED Course  I have reviewed the triage vital signs and the nursing notes.  Pertinent labs & imaging results that were available during my care of the patient were reviewed by me and considered in my medical decision making (see chart for details).  MDM Reviewed: nursing note, vitals and previous chart Reviewed previous: labs  and ECG Interpretation: labs, ECG and x-ray   Results for orders placed or performed during the hospital encounter of 01/20/17  Lipase, blood  Result Value Ref Range   Lipase 23 11 - 51 U/L  Comprehensive metabolic panel  Result Value Ref Range   Sodium 140 135 - 145 mmol/L   Potassium 3.2 (L) 3.5 - 5.1 mmol/L   Chloride 104 101 - 111 mmol/L   CO2 25 22 - 32 mmol/L   Glucose, Bld 112 (H) 65 - 99 mg/dL   BUN 16 6 - 20 mg/dL   Creatinine, Ser 1.14 (H) 0.44 - 1.00 mg/dL   Calcium 9.5 8.9 - 10.3 mg/dL   Total Protein 6.6 6.5 - 8.1 g/dL   Albumin 3.8 3.5 - 5.0 g/dL   AST 39 15 - 41 U/L   ALT 20 14 - 54 U/L   Alkaline Phosphatase 57 38 - 126 U/L   Total Bilirubin 0.7 0.3 - 1.2 mg/dL   GFR calc non Af Amer 44 (L) >60 mL/min   GFR calc Af Amer 51 (L) >60 mL/min   Anion gap 11 5 - 15  CBC with Differential  Result Value Ref Range   WBC 10.0 4.0 - 10.5 K/uL   RBC 5.03 3.87 - 5.11 MIL/uL   Hemoglobin 15.5 (H) 12.0 - 15.0 g/dL   HCT 46.1 (H) 36.0 - 46.0 %   MCV 91.7 78.0 - 100.0 fL   MCH 30.8 26.0 - 34.0 pg   MCHC 33.6 30.0 - 36.0 g/dL   RDW 14.1 11.5 - 15.5 %   Platelets 228 150 - 400 K/uL   Neutrophils Relative % 75 %   Neutro Abs 7.5 1.7 - 7.7 K/uL  Lymphocytes Relative 16 %   Lymphs Abs 1.6 0.7 - 4.0 K/uL   Monocytes Relative 8 %   Monocytes Absolute 0.8 0.1 - 1.0 K/uL   Eosinophils Relative 1 %   Eosinophils Absolute 0.1 0.0 - 0.7 K/uL   Basophils Relative 0 %   Basophils Absolute 0.0 0.0 - 0.1 K/uL  Troponin I  Result Value Ref Range   Troponin I <0.03 <0.03 ng/mL  Brain natriuretic peptide  Result Value Ref Range   B Natriuretic Peptide 312.0 (H) 0.0 - 100.0 pg/mL   Dg Chest Portable 1 View Result Date: 01/20/2017 CLINICAL DATA:  83 year old female with shortness of breath since last night. EXAM: PORTABLE CHEST 1 VIEW COMPARISON:  10/13/2016 and earlier. FINDINGS: Portable AP upright view at 0906 hours. Stable right chest stool lead cardiac pacemaker. Stable lung  volumes. Stable cardiac size and mediastinal contours. Calcified aortic atherosclerosis. No pneumothorax, pulmonary edema, pleural effusion or confluent pulmonary opacity. Visualized tracheal air column is within normal limits. IMPRESSION: No acute cardiopulmonary abnormality. Calcified aortic atherosclerosis. Electronically Signed   By: Genevie Ann M.D.   On: 01/20/2017 09:19   Dg Abd 2 Views Result Date: 01/20/2017 CLINICAL DATA:  Nausea. EXAM: ABDOMEN - 2 VIEW COMPARISON:  03/07/2015 FINDINGS: The bowel gas pattern is normal. There is no evidence of free air. No radio-opaque calculi or other significant radiographic abnormality is seen. IMPRESSION: Nonobstructive bowel gas pattern. Electronically Signed   By: Kerby Moors M.D.   On: 01/20/2017 11:00    1245:  Pt initially given IV ativan to tx anxiety and nausea. Pt then given her usual BP meds. Pt has not vomited in the ED. Pt then stood for orthostatic VS:  HR increased to 170-180's, and pt c/o SOB. Pt laid back down with HR decreasing to 70's-100's.  Dx and testing d/w pt and family.  Questions answered.  Verb understanding, agreeable to admit.   T/C to Cards Dr. Harl Bowie, case discussed, including:  HPI, pertinent PM/SHx, VS/PE, dx testing, ED course and treatment:  He has reviewed chart, states pt will need admission for HR and BP control, admit to [facility] to tele and Cards will consult. T/C to [facility] Dr. Roderic Palau, case discussed, including:  HPI, pertinent PM/SHx, VS/PE, dx testing, ED course and treatment, as well as d/w Cards MD:  Agreeable to admit.     Final Clinical Impressions(s) / ED Diagnoses   Final diagnoses:  Nausea    New Prescriptions New Prescriptions   No medications on file     Francine Graven, DO 01/24/17 1629

## 2017-01-20 NOTE — Progress Notes (Signed)
RN notified MD that patient had been kept on bedrest by ED due to patient's heart rate increasing into the 180s when she stood up, per ED nurse. There is currently no bedrest order for patient. MD stated for RN to assist patient to stand and see how she does, then if she does OK then she can go to the bathroom or use bedside commode. Patient heart rate prior to standing ranged in A-fib from 90s-105. While standing, HR increased to 115 but remained. While in the bathroom, telemetry notified RN that patient had a nonsustained HR of 140s-150s then came back down to 115-120. Patient states that she felt "fine" and felt "no different." RN notified MD of all of this and he stated OK for patient to continue to get up with staff assistance. MD gave RN verbal orders for toprol XL 75 mg this evening, on top of the 50 mg of lopressor she received while in the ED, and he stated OK for toprol XL 75 mg to still be given at 1000 tomorrow AM, as scheduled. RN notified pharmacy of this.

## 2017-01-21 DIAGNOSIS — F419 Anxiety disorder, unspecified: Secondary | ICD-10-CM | POA: Diagnosis not present

## 2017-01-21 DIAGNOSIS — I10 Essential (primary) hypertension: Secondary | ICD-10-CM | POA: Diagnosis not present

## 2017-01-21 DIAGNOSIS — G894 Chronic pain syndrome: Secondary | ICD-10-CM | POA: Diagnosis not present

## 2017-01-21 DIAGNOSIS — E038 Other specified hypothyroidism: Secondary | ICD-10-CM | POA: Diagnosis not present

## 2017-01-21 DIAGNOSIS — I4891 Unspecified atrial fibrillation: Secondary | ICD-10-CM | POA: Diagnosis not present

## 2017-01-21 DIAGNOSIS — I160 Hypertensive urgency: Secondary | ICD-10-CM | POA: Diagnosis not present

## 2017-01-21 DIAGNOSIS — N183 Chronic kidney disease, stage 3 (moderate): Secondary | ICD-10-CM | POA: Diagnosis not present

## 2017-01-21 LAB — CBC
HEMATOCRIT: 43.2 % (ref 36.0–46.0)
HEMOGLOBIN: 14.4 g/dL (ref 12.0–15.0)
MCH: 30.8 pg (ref 26.0–34.0)
MCHC: 33.3 g/dL (ref 30.0–36.0)
MCV: 92.5 fL (ref 78.0–100.0)
Platelets: 222 10*3/uL (ref 150–400)
RBC: 4.67 MIL/uL (ref 3.87–5.11)
RDW: 14.4 % (ref 11.5–15.5)
WBC: 8.4 10*3/uL (ref 4.0–10.5)

## 2017-01-21 LAB — BASIC METABOLIC PANEL
Anion gap: 9 (ref 5–15)
BUN: 13 mg/dL (ref 6–20)
CHLORIDE: 108 mmol/L (ref 101–111)
CO2: 23 mmol/L (ref 22–32)
CREATININE: 1.05 mg/dL — AB (ref 0.44–1.00)
Calcium: 9.2 mg/dL (ref 8.9–10.3)
GFR calc Af Amer: 56 mL/min — ABNORMAL LOW (ref >60)
GFR calc non Af Amer: 48 mL/min — ABNORMAL LOW (ref >60)
GLUCOSE: 95 mg/dL (ref 65–99)
Potassium: 4.6 mmol/L (ref 3.5–5.1)
SODIUM: 140 mmol/L (ref 135–145)

## 2017-01-21 LAB — TROPONIN I: TROPONIN I: 0.03 ng/mL — AB (ref <0.03)

## 2017-01-21 MED ORDER — HYDRALAZINE HCL 25 MG PO TABS: 25.0000 mg | ORAL_TABLET | Freq: Three times a day (TID) | ORAL | 0 refills | Status: DC

## 2017-01-21 MED ORDER — ONDANSETRON 4 MG PO TBDP: 4.0000 mg | ORAL_TABLET | Freq: Four times a day (QID) | ORAL | Status: DC | PRN

## 2017-01-21 MED ORDER — HYDRALAZINE HCL 25 MG PO TABS
25.0000 mg | ORAL_TABLET | Freq: Three times a day (TID) | ORAL | Status: DC
  Administered 2017-01-21 (×2): 25 mg via ORAL
  Filled 2017-01-21 (×2): qty 1

## 2017-01-21 MED ORDER — METOPROLOL SUCCINATE ER 25 MG PO TB24: 75.0000 mg | ORAL_TABLET | Freq: Every day | ORAL | 0 refills | Status: DC

## 2017-01-21 NOTE — Progress Notes (Signed)
CRITICAL VALUE ALERT  Critical value received:  Troponin 0.03  Date of notification:  01/21/2017  Time of notification:  0016  Critical value read back:Yes.    Nurse who received alert:  Ames Dura RN  MD notified (1st page):  Baltazar Najjar  Time of first page:  [phone]

## 2017-01-21 NOTE — Care Management Obs Status (Signed)
[place] NOTIFICATION   Patient Details  Name: Kelsey Sandoval MRN: [id number] Date of Birth: 1933-01-11   Medicare Observation Status Notification Given:  Yes    Sherald Barge, RN 01/21/2017, 12:50 PM

## 2017-01-21 NOTE — Progress Notes (Signed)
Progress Note  Patient Name: JEANNY RYMER Date of Encounter: 01/21/2017   Subjective   Some nausea this morning.   Inpatient Medications    Scheduled Meds: . amLODipine  10 mg Oral Daily  . busPIRone  10 mg Oral TID  . dabigatran  150 mg Oral BID  . DULoxetine  60 mg Oral BID  . gabapentin  300 mg Oral TID  . hydrALAZINE  25 mg Oral Q8H  . levothyroxine  50 mcg Oral QAC breakfast  . lisinopril  40 mg Oral Daily  . metoprolol succinate  75 mg Oral Daily  . ondansetron (ZOFRAN) IV  4 mg Intravenous Q6H  . pantoprazole  40 mg Oral Daily  . pravastatin  80 mg Oral q1800   Continuous Infusions:  PRN Meds: acetaminophen, ALPRAZolam, HYDROcodone-acetaminophen, hydrOXYzine, ondansetron (ZOFRAN) IV   Vital Signs    Vitals:   01/20/17 2209 01/21/17 0543 01/21/17 0732 01/21/17 0832  BP: (!) 141/82 (!) 182/88 (!) 180/90 (!) 174/90  Pulse: 83 80  76  Resp: 15 16    Temp: 98.7 F (37.1 C) 98 F (36.7 C)    TempSrc: Oral Oral    SpO2: 96% 94%    Weight:      Height:        Intake/Output Summary (Last 24 hours) at 01/21/17 0947 Last data filed at 01/21/17 0522  Gross per 24 hour  Intake           1637.5 ml  Output              500 ml  Net           1137.5 ml   Filed Weights   01/20/17 0853  Weight: 160 lb (72.6 kg)    Telemetry     - Personally Reviewed- afib variable rates, tachycardic to 130s at times.   ECG    afib - Personally Reviewed  Physical Exam   GEN: No acute distress.   Neck: No JVD Cardiac: irreg, normal rate, no murmurs, rubs, or gallops.  Respiratory: Clear to auscultation bilaterally. GI: Soft, nontender, non-distended  MS: No edema; No deformity. Neuro:  Nonfocal  Psych: Normal affect   Labs    Chemistry Recent Labs Lab 01/20/17 0925 01/21/17 0520  NA 140 140  K 3.2* 4.6  CL 104 108  CO2 25 23  GLUCOSE 112* 95  BUN 16 13  CREATININE 1.14* 1.05*  CALCIUM 9.5 9.2  PROT 6.6  --   ALBUMIN 3.8  --   AST 39  --   ALT 20   --   ALKPHOS 57  --   BILITOT 0.7  --   GFRNONAA 44* 48*  GFRAA 51* 56*  ANIONGAP 11 9     Hematology Recent Labs Lab 01/20/17 0925 01/21/17 0520  WBC 10.0 8.4  RBC 5.03 4.67  HGB 15.5* 14.4  HCT 46.1* 43.2  MCV 91.7 92.5  MCH 30.8 30.8  MCHC 33.6 33.3  RDW 14.1 14.4  PLT 228 222    Cardiac Enzymes Recent Labs Lab 01/20/17 0925 01/20/17 1742 01/20/17 2323 01/21/17 0520  TROPONINI <0.03 <0.03 0.03* <0.03   No results for input(s): TROPIPOC in the last 168 hours.   BNP Recent Labs Lab 01/20/17 0925  BNP 312.0*     DDimer No results for input(s): DDIMER in the last 168 hours.   Radiology    Dg Chest Portable 1 View  Result Date: 01/20/2017 CLINICAL DATA:  83 year old female with  shortness of breath since last night. EXAM: PORTABLE CHEST 1 VIEW COMPARISON:  10/13/2016 and earlier. FINDINGS: Portable AP upright view at 0906 hours. Stable right chest stool lead cardiac pacemaker. Stable lung volumes. Stable cardiac size and mediastinal contours. Calcified aortic atherosclerosis. No pneumothorax, pulmonary edema, pleural effusion or confluent pulmonary opacity. Visualized tracheal air column is within normal limits. IMPRESSION: No acute cardiopulmonary abnormality. Calcified aortic atherosclerosis. Electronically Signed   By: Genevie Ann M.D.   On: 01/20/2017 09:19   Dg Abd 2 Views  Result Date: 01/20/2017 CLINICAL DATA:  Nausea. EXAM: ABDOMEN - 2 VIEW COMPARISON:  03/07/2015 FINDINGS: The bowel gas pattern is normal. There is no evidence of free air. No radio-opaque calculi or other significant radiographic abnormality is seen. IMPRESSION: Nonobstructive bowel gas pattern. Electronically Signed   By: Kerby Moors M.D.   On: 01/20/2017 11:00    Cardiac Studies    Patient Profile     83 y.o. female  Admitted with hypertensive urgency and afib with RVR.   Assessment & Plan    1. HTN urgency - severe HTN on admission, she had not taken her bp meds x 2 days at  home - restarted on home oral regimen. Initially bp's improved, this morning prior to morning meds bp trending back up - follow bp's into the afternoon after morning meds.   2. Afib - elevated rates on admission due to medication noncompliance - resting rates have normalized, overnight continues issues with tachycardia with standing. Unclear if related to orthostasis or inadequate rate control. Toprol increased to 75mg  daily, follow rates.  - flecanide not working, has been stopped. Last device check showed 100% afib - continue pradaxa  3. Orthostatic tachcyardia - signs of hypovolemia on admission. She endorsed poor oral intake. Started on IVFs. Hemoconentration resolving, downtrend in Cr and BUN - repeat orthostatics this AM   Overall exacerbations of blood pressure and afib due to medication noncompliance, compounded by probable hypovolemia. Medications being reintroduced, following to see how bp and heart rates settle. Follow bps and heart rates into the afternoon, if stabilize including with orthostatics would be ok for discharge, if not may need one more day of monitoring.     Merrily Pew, MD  01/21/2017, 9:47 AM
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
## 2017-01-21 NOTE — Progress Notes (Signed)
RN received discharge order. Patient notified of discharge earlier this Am but stated her son would not be able to pick her up until later this afternoon, MD was in room at the time and stated this is OK. Patient now has a friend at the bedside and both the patient and her family friend are agreeable that the the patient's family friend will take the patient home. Discharge paperwork provided to patient. Patient stated understanding of discharge paperwork and was able to teach information back. Patient's IV and telemetry removed. Patient taken downstairs in wheelchair by nurse tech.

## 2017-01-22 NOTE — Discharge Summary (Signed)
Physician Discharge Summary  Kelsey Sandoval IRJ:[id number] DOB: 1933-05-23 DOA: 01/20/2017  PCP: Sallee Lange, MD  Admit date: 01/20/2017 Discharge date: 01/22/2017  Admitted From: home Disposition:  home  Recommendations for Outpatient Follow-up:  1. Follow up with PCP in 1-2 weeks 2. Please obtain BMP/CBC in one week 3. Patient has been referred back to Dr. Laural Golden for further evaluation of nausea  Home Health: Equipment/Devices:  Discharge Condition: stable CODE STATUS: DNR Diet recommendation: Heart Healthy   Brief/Interim Summary: 83 year old female who presented with nausea, decreased by mouth intake and generalized weakness. The patient has had intermittent episodes of nausea for quite some time which has been worked up in the past. She does not appear to have any obvious etiology of this nausea. She came to the hospital and was found to have orthostatic tachycardia with heart rates of 180 on standing and atrial fibrillation. She was felt to be mildly dehydrated and started on IV fluids. She was seen by cardiology. Her Toprol dose was increased from 50 mg daily to 75 mg daily. She is continued on pradaxa for anticoagulation. Blood pressure was noted to be elevated on admission which was likely related to noncompliance with antihypertensives. Once her vacations are restarted, her blood pressures did improve. The following day, after hydration, she did feel mildly improving. Her orthostatic tachycardia had resolved and blood pressures were better. She is continued on Zofran for when necessary nausea. She will be referred back to Dr. Laural Golden who she has seen in the past regarding further workup for nausea. The patient has several issues including depression, anxiety and chronic pain. These are being addressed by her primary care physician. She appears otherwise stable for discharge today.  Discharge Diagnoses:  Active Problems:   Tachycardia-bradycardia syndrome (HCC)   Systemic  hypertension   Pacemaker   Hypothyroidism   Chronic pain syndrome   Chronic kidney disease (CKD), stage III (moderate)   Atrial fibrillation with RVR (HCC)   Nausea   Anxiety    Discharge Instructions  Discharge Instructions    Diet - low sodium heart healthy    Complete by:  As directed    Increase activity slowly    Complete by:  As directed      Allergies as of 01/21/2017      Reactions   Penicillins Other (See Comments)   Caused patient to pass out.Can take cephalosporins Has patient had a PCN reaction causing immediate rash, facial/tongue/throat swelling, SOB or lightheadedness with hypotension: no Has patient had a PCN reaction causing severe rash involving mucus membranes or skin necrosis: No Has patient had a PCN reaction that required hospitalization No Has patient had a PCN reaction occurring within the last 10 years: No If all of the above answers are "NO", then may proceed with Cephalosporin use.   Levaquin [levofloxacin] Nausea Only   Sulfa Antibiotics Other (See Comments)   Unknown   Zithromax [azithromycin] Nausea Only, Rash      Medication List    STOP taking these medications   flecainide 50 MG tablet Commonly known as:  TAMBOCOR     TAKE these medications   acetaminophen 650 MG CR tablet Commonly known as:  TYLENOL Take 1,300 mg by mouth every 8 (eight) hours as needed for pain.   albuterol 108 (90 Base) MCG/ACT inhaler Commonly known as:  PROVENTIL HFA;VENTOLIN HFA Inhale 2 puffs into the lungs every 6 (six) hours as needed for wheezing or shortness of breath.   ALPRAZolam 1 MG tablet Commonly  known as:  XANAX 1 qhs prn insomnia What changed:  how much to take  how to take this  when to take this  reasons to take this  additional instructions   amLODipine 10 MG tablet Commonly known as:  NORVASC Take 1 tablet (10 mg total) by mouth daily.   busPIRone 10 MG tablet Commonly known as:  BUSPAR Take 1 tablet (10 mg total) by mouth 3  (three) times daily.   dabigatran 150 MG Caps capsule Commonly known as:  PRADAXA Take 1 capsule (150 mg total) by mouth 2 (two) times daily.   DULoxetine 60 MG capsule Commonly known as:  CYMBALTA Take 1 capsule (60 mg total) by mouth 2 (two) times daily.   gabapentin 300 MG capsule Commonly known as:  NEURONTIN TAKE ONE CAPSULE BY MOUTH THREE TIMES DAILY   hydrALAZINE 25 MG tablet Commonly known as:  APRESOLINE Take 1 tablet (25 mg total) by mouth every 8 (eight) hours.   HYDROcodone-acetaminophen 5-325 MG tablet Commonly known as:  NORCO/VICODIN Take 1 tablet by mouth every 4 (four) hours as needed.   hydrOXYzine 10 MG tablet Commonly known as:  ATARAX/VISTARIL One bid prn nausea, caution drowsiness   levothyroxine 50 MCG tablet Commonly known as:  SYNTHROID, LEVOTHROID TAKE ONE TABLET BY MOUTH ONCE DAILY **CHANGE  IN  DOSE**   lisinopril 40 MG tablet Commonly known as:  PRINIVIL,ZESTRIL Take 1 tablet (40 mg total) by mouth daily.   metoprolol succinate 25 MG 24 hr tablet Commonly known as:  TOPROL-XL Take 3 tablets (75 mg total) by mouth daily. Take with or immediately following a meal. What changed:  medication strength  how much to take   ondansetron 8 MG disintegrating tablet Commonly known as:  ZOFRAN-ODT Take 1 tablet (8 mg total) by mouth every 8 (eight) hours as needed for nausea or vomiting.   pantoprazole 40 MG tablet Commonly known as:  PROTONIX Take 1 tablet (40 mg total) by mouth daily. Reported on 01/28/2016   pravastatin 80 MG tablet Commonly known as:  PRAVACHOL Take 1 tablet (80 mg total) by mouth daily.   vitamin B-12 1000 MCG tablet Commonly known as:  CYANOCOBALAMIN Take 1 tablet (1,000 mcg total) by mouth daily.       Allergies  Allergen Reactions  . Penicillins Other (See Comments)    Caused patient to pass out.Can take cephalosporins Has patient had a PCN reaction causing immediate rash, facial/tongue/throat swelling, SOB or  lightheadedness with hypotension: no Has patient had a PCN reaction causing severe rash involving mucus membranes or skin necrosis: No Has patient had a PCN reaction that required hospitalization No Has patient had a PCN reaction occurring within the last 10 years: No If all of the above answers are "NO", then may proceed with Cephalosporin use.   . Levaquin [Levofloxacin] Nausea Only  . Sulfa Antibiotics Other (See Comments)    Unknown  . Zithromax [Azithromycin] Nausea Only and Rash    Consultations:  Cardiology   Procedures/Studies: Dg Chest Portable 1 View  Result Date: 01/20/2017 CLINICAL DATA:  83 year old female with shortness of breath since last night. EXAM: PORTABLE CHEST 1 VIEW COMPARISON:  10/13/2016 and earlier. FINDINGS: Portable AP upright view at 0906 hours. Stable right chest stool lead cardiac pacemaker. Stable lung volumes. Stable cardiac size and mediastinal contours. Calcified aortic atherosclerosis. No pneumothorax, pulmonary edema, pleural effusion or confluent pulmonary opacity. Visualized tracheal air column is within normal limits. IMPRESSION: No acute cardiopulmonary abnormality. Calcified aortic atherosclerosis. Electronically  Signed   By: Genevie Ann M.D.   On: 01/20/2017 09:19   Dg Abd 2 Views  Result Date: 01/20/2017 CLINICAL DATA:  Nausea. EXAM: ABDOMEN - 2 VIEW COMPARISON:  03/07/2015 FINDINGS: The bowel gas pattern is normal. There is no evidence of free air. No radio-opaque calculi or other significant radiographic abnormality is seen. IMPRESSION: Nonobstructive bowel gas pattern. Electronically Signed   By: Kerby Moors M.D.   On: 01/20/2017 11:00       Subjective: Still has some nausea, but overall she is feeling better.  Discharge Exam: Vitals:   01/21/17 1300 01/21/17 1515  BP: (!) 169/81 133/74  Pulse: 72 76  Resp: 18 16  Temp: 98 F (36.7 C) 97.6 F (36.4 C)   Vitals:   01/21/17 0732 01/21/17 0832 01/21/17 1300 01/21/17 1515  BP: (!)  180/90 (!) 174/90 (!) 169/81 133/74  Pulse:  76 72 76  Resp:   18 16  Temp:   98 F (36.7 C) 97.6 F (36.4 C)  TempSrc:   Oral Oral  SpO2:   93% 97%  Weight:      Height:        General: Pt is alert, awake, not in acute distress Cardiovascular: Irregular, S1/S2 +, no rubs, no gallops Respiratory: CTA bilaterally, no wheezing, no rhonchi Abdominal: Soft, NT, ND, bowel sounds + Extremities: no edema, no cyanosis    The results of significant diagnostics from this hospitalization (including imaging, microbiology, ancillary and laboratory) are listed below for reference.     Microbiology: No results found for this or any previous visit (from the past 240 hour(s)).   Labs: BNP (last 3 results)  Recent Labs  08/06/16 1500 09/07/16 1026 01/20/17 0925  BNP 646.0* 232.0* 026.3*   Basic Metabolic Panel:  Recent Labs Lab 01/20/17 0925 01/21/17 0520  NA 140 140  K 3.2* 4.6  CL 104 108  CO2 25 23  GLUCOSE 112* 95  BUN 16 13  CREATININE 1.14* 1.05*  CALCIUM 9.5 9.2   Liver Function Tests:  Recent Labs Lab 01/20/17 0925  AST 39  ALT 20  ALKPHOS 57  BILITOT 0.7  PROT 6.6  ALBUMIN 3.8    Recent Labs Lab 01/20/17 0925  LIPASE 23   No results for input(s): AMMONIA in the last 168 hours. CBC:  Recent Labs Lab 01/20/17 0925 01/21/17 0520  WBC 10.0 8.4  NEUTROABS 7.5  --   HGB 15.5* 14.4  HCT 46.1* 43.2  MCV 91.7 92.5  PLT 228 222   Cardiac Enzymes:  Recent Labs Lab 01/20/17 0925 01/20/17 1742 01/20/17 2323 01/21/17 0520  TROPONINI <0.03 <0.03 0.03* <0.03   BNP: Invalid input(s): POCBNP CBG: No results for input(s): GLUCAP in the last 168 hours. D-Dimer No results for input(s): DDIMER in the last 72 hours. Hgb A1c No results for input(s): HGBA1C in the last 72 hours. Lipid Profile No results for input(s): CHOL, HDL, LDLCALC, TRIG, CHOLHDL, LDLDIRECT in the last 72 hours. Thyroid function studies  Recent Labs  01/20/17 1742  TSH  0.848   Anemia work up No results for input(s): VITAMINB12, FOLATE, FERRITIN, TIBC, IRON, RETICCTPCT in the last 72 hours. Urinalysis    Component Value Date/Time   COLORURINE YELLOW 01/20/2017 1130   APPEARANCEUR CLEAR 01/20/2017 1130   LABSPEC 1.015 01/20/2017 1130   PHURINE 6.5 01/20/2017 1130   GLUCOSEU NEGATIVE 01/20/2017 1130   HGBUR TRACE (A) 01/20/2017 1130   BILIRUBINUR NEGATIVE 01/20/2017 1130   BILIRUBINUR 3+ 01/14/2016  [place] 01/20/2017 1130   PROTEINUR 100 (A) 01/20/2017 1130   UROBILINOGEN negative 01/14/2016 1036   UROBILINOGEN 0.2 03/07/2015 2005   NITRITE NEGATIVE 01/20/2017 1130   LEUKOCYTESUR NEGATIVE 01/20/2017 1130   Sepsis Labs Invalid input(s): PROCALCITONIN,  WBC,  LACTICIDVEN Microbiology No results found for this or any previous visit (from the past 240 hour(s)).   Time coordinating discharge: Over 30 minutes  SIGNED:   Kathie Dike, MD  [facility] 01/22/2017, 7:32 PM Pager   If 7PM-7AM, please contact night-coverage [email or web address] Password [email or web address]

## 2017-01-22 NOTE — Progress Notes (Signed)
Appeal submitted and patient was able to pick up her medication 01/19/17 per pharmacy.

## 2017-01-25 ENCOUNTER — Encounter: Payer: Self-pay | Admitting: Family Medicine

## 2017-01-25 ENCOUNTER — Ambulatory Visit (INDEPENDENT_AMBULATORY_CARE_PROVIDER_SITE_OTHER): Payer: PPO | Admitting: Family Medicine

## 2017-01-25 VITALS — BP 120/78 | Ht 71.0 in | Wt 158.0 lb

## 2017-01-25 DIAGNOSIS — R634 Abnormal weight loss: Secondary | ICD-10-CM | POA: Diagnosis not present

## 2017-01-25 DIAGNOSIS — R110 Nausea: Secondary | ICD-10-CM

## 2017-01-25 MED ORDER — HYDROCODONE-ACETAMINOPHEN 7.5-325 MG PO TABS: 1.0000 | ORAL_TABLET | ORAL | 0 refills | Status: DC | PRN

## 2017-01-25 MED ORDER — TRAZODONE HCL 50 MG PO TABS: 25.0000 mg | ORAL_TABLET | Freq: Every evening | ORAL | 3 refills | Status: DC | PRN

## 2017-01-25 NOTE — Progress Notes (Signed)
   Subjective:    Patient ID: Kelsey Sandoval, female    DOB: [birth year], 83 y.o.   MRN: [id number]  Hypertension  This is a chronic problem. The current episode started more than 1 year ago. The problem has been gradually improving since onset. There are no associated agents to hypertension. There are no known risk factors for coronary artery disease. Treatments tried: amlodipine. The current treatment provides moderate improvement. There are no compliance problems.     Patient is having right hip/leg pain (sciatica). Pain down the left leg severe Dr Brien Few will be seeing her on Tuesday     Review of Systems Relates back pain sciatica. Relates some fatigue. Denies chest pain or shortness of breath.    Objective:   Physical Exam Lungs are clear hearts regular pulse normal extremities no edema skin warm dry       Assessment & Plan:  I have advised the patient to minimize Xanax at nighttime maximum use half tablet at bedtime Trazodone to help with sleep  Severe low back pain with sciatica is seen specialist hopefully they can help her out but given prescription for next step up on hydrocodone she is to follow-up in a couple weeks to see how this is doing  Reduce hydralazine to twice a day will recheck blood pressure sitting standing on follow-up she does have mild orthostasis  Chronic nausea referral to gastroenterology await their input  Patient will follow-up with psychiatry but I do not feel that this patient should be on any type of stronger medications because of all her pain medicine

## 2017-01-26 ENCOUNTER — Ambulatory Visit: Payer: Self-pay | Admitting: Orthopaedic Surgery

## 2017-01-26 DIAGNOSIS — M5416 Radiculopathy, lumbar region: Secondary | ICD-10-CM | POA: Diagnosis not present

## 2017-01-26 DIAGNOSIS — M5126 Other intervertebral disc displacement, lumbar region: Secondary | ICD-10-CM | POA: Diagnosis not present

## 2017-01-26 DIAGNOSIS — I10 Essential (primary) hypertension: Secondary | ICD-10-CM | POA: Diagnosis not present

## 2017-01-27 ENCOUNTER — Other Ambulatory Visit (HOSPITAL_COMMUNITY): Payer: Self-pay | Admitting: Psychiatry

## 2017-01-27 ENCOUNTER — Encounter: Payer: Self-pay | Admitting: Orthopaedic Surgery

## 2017-01-27 ENCOUNTER — Ambulatory Visit (INDEPENDENT_AMBULATORY_CARE_PROVIDER_SITE_OTHER): Payer: PPO | Admitting: Orthopaedic Surgery

## 2017-01-27 VITALS — BP 144/69 | HR 117 | Temp 97.7°F | Ht 71.0 in | Wt 164.0 lb

## 2017-01-27 DIAGNOSIS — M25562 Pain in left knee: Secondary | ICD-10-CM

## 2017-01-27 DIAGNOSIS — M25561 Pain in right knee: Secondary | ICD-10-CM | POA: Diagnosis not present

## 2017-01-27 DIAGNOSIS — G8929 Other chronic pain: Secondary | ICD-10-CM

## 2017-01-27 NOTE — Progress Notes (Signed)
CC: Both of my knees are hurting. I would like an injection in both knees.  The patient has had chronic pain and tenderness of both knees for some time.  Injections help.  There is no locking or giving way of the knee.  There is no new trauma. There is no redness or signs of infections.  The knees have a mild effusion and some crepitus.  There is no redness or signs of recent trauma.  Right knee ROM is 0-95 and left knee ROM is 0-100.  Impression:  Chronic pain of the both knees  Return:  6 weeks  PROCEDURE NOTE:  The patient requests injections of both knees, verbal consent was obtained.  The left and right knee were individually prepped appropriately after time out was performed.   Sterile technique was observed and injection of 1 cc of Depo-Medrol 40 mg with several cc's of plain xylocaine. Anesthesia was provided by ethyl chloride and a 20-gauge needle was used to inject each knee area. The injections were tolerated well.  A band aid dressing was applied.  The patient was advised to apply ice later today and tomorrow to the injection sight as needed.   Electronically Signed Sanjuana Kava, MD 4/25/20183:25 PM

## 2017-01-28 ENCOUNTER — Encounter: Payer: Self-pay | Admitting: Family Medicine

## 2017-02-01 ENCOUNTER — Other Ambulatory Visit: Payer: Self-pay | Admitting: Family Medicine

## 2017-02-02 DIAGNOSIS — M5416 Radiculopathy, lumbar region: Secondary | ICD-10-CM | POA: Diagnosis not present

## 2017-02-02 DIAGNOSIS — I10 Essential (primary) hypertension: Secondary | ICD-10-CM | POA: Diagnosis not present

## 2017-02-02 DIAGNOSIS — M5126 Other intervertebral disc displacement, lumbar region: Secondary | ICD-10-CM | POA: Diagnosis not present

## 2017-02-04 ENCOUNTER — Encounter: Payer: Self-pay | Admitting: Family Medicine

## 2017-02-04 ENCOUNTER — Ambulatory Visit (INDEPENDENT_AMBULATORY_CARE_PROVIDER_SITE_OTHER): Payer: PPO | Admitting: Family Medicine

## 2017-02-04 VITALS — BP 142/80 | Ht 71.0 in | Wt 157.0 lb

## 2017-02-04 DIAGNOSIS — G894 Chronic pain syndrome: Secondary | ICD-10-CM

## 2017-02-04 DIAGNOSIS — R110 Nausea: Secondary | ICD-10-CM

## 2017-02-04 MED ORDER — HYDROCODONE-ACETAMINOPHEN 7.5-325 MG PO TABS: 1.0000 | ORAL_TABLET | ORAL | 0 refills | Status: DC | PRN

## 2017-02-04 MED ORDER — ALPRAZOLAM 0.5 MG PO TABS: ORAL_TABLET | ORAL | 4 refills | Status: DC

## 2017-02-04 NOTE — Patient Instructions (Signed)
Please follow-up late June May use pain medicine as needed may use up to 5 per day when possible try to get by with 4 per day  Xanax TO be 0.5 mg evening for sleep when necessary

## 2017-02-04 NOTE — Progress Notes (Signed)
   Subjective:    Patient ID: Kelsey Sandoval, female    DOB: [birth year], 83 y.o.   MRN: [id number]  HPIFollow up on right hip pain. Had injections 2 days ago. Feeling some better.   No appetitie. Still having nausea. Mostly in the morning.  Patient states she is trying eat she is trying to drink liquids. She denies any severe abdominal pain. No rectal bleeding. No cough wheezing or difficulty breathing.  Patient states her depression is been giving her some trouble she plans on going back to see her psychiatrist She is taking pain medicine up to 5 per day to help with pain control.   Review of Systems See above. No vomiting no diarrhea no rectal bleeding    Objective:   Physical Exam Lungs are clear hearts regular       Assessment & Plan:  Her weight is relatively stable She has had moderate weight loss over the past 6 months She will see gastroenterology to work with her chronic nausea She may need to have a CT scan of abdomen and pelvis I believe that up to them  Chronic pain current prescription helping her no greater than 5 per day prescriptions given patient will follow-up mid to late June for recheck  Insomnia use Xanax low-dose at nighttime only  Recheck in June

## 2017-02-05 ENCOUNTER — Ambulatory Visit (INDEPENDENT_AMBULATORY_CARE_PROVIDER_SITE_OTHER): Payer: Self-pay | Admitting: Internal Medicine

## 2017-02-08 ENCOUNTER — Ambulatory Visit: Payer: PPO | Admitting: Family Medicine

## 2017-02-08 ENCOUNTER — Ambulatory Visit (INDEPENDENT_AMBULATORY_CARE_PROVIDER_SITE_OTHER): Payer: PPO | Admitting: Internal Medicine

## 2017-02-08 ENCOUNTER — Telehealth: Payer: Self-pay | Admitting: Internal Medicine

## 2017-02-08 ENCOUNTER — Encounter: Payer: Self-pay | Admitting: Internal Medicine

## 2017-02-08 VITALS — BP 120/64 | HR 85 | Ht 70.0 in | Wt 163.0 lb

## 2017-02-08 DIAGNOSIS — I480 Paroxysmal atrial fibrillation: Secondary | ICD-10-CM | POA: Diagnosis not present

## 2017-02-08 DIAGNOSIS — Z950 Presence of cardiac pacemaker: Secondary | ICD-10-CM

## 2017-02-08 DIAGNOSIS — I10 Essential (primary) hypertension: Secondary | ICD-10-CM | POA: Diagnosis not present

## 2017-02-08 LAB — CUP PACEART INCLINIC DEVICE CHECK
Battery Impedance: 274 Ohm
Battery Remaining Longevity: 112 mo
Battery Voltage: 2.78 V
Brady Statistic AP VP Percent: 1 %
Date Time Interrogation Session: 20180507124133
Implantable Lead Implant Date: 20120810
Implantable Lead Implant Date: 20120810
Implantable Lead Location: 753860
Implantable Lead Model: 5076
Implantable Lead Model: 5076
Implantable Pulse Generator Implant Date: 20120810
Lead Channel Impedance Value: 462 Ohm
Lead Channel Pacing Threshold Pulse Width: 0.4 ms
Lead Channel Sensing Intrinsic Amplitude: 0.5 mV
Lead Channel Sensing Intrinsic Amplitude: 15.67 mV
Lead Channel Setting Pacing Amplitude: 2 V
Lead Channel Setting Pacing Amplitude: 2.5 V
Lead Channel Setting Pacing Pulse Width: 0.4 ms
Lead Channel Setting Sensing Sensitivity: 5.6 mV
MDC IDC LEAD LOCATION: 753859
MDC IDC MSMT LEADCHNL RV IMPEDANCE VALUE: 676 Ohm
MDC IDC MSMT LEADCHNL RV PACING THRESHOLD AMPLITUDE: 1 V
MDC IDC STAT BRADY AP VS PERCENT: 0 %
MDC IDC STAT BRADY AS VP PERCENT: 1 %
MDC IDC STAT BRADY AS VS PERCENT: 98 %

## 2017-02-08 MED ORDER — METOPROLOL SUCCINATE ER 50 MG PO TB24: 50.0000 mg | ORAL_TABLET | Freq: Two times a day (BID) | ORAL | 3 refills | Status: DC

## 2017-02-08 MED ORDER — APIXABAN 5 MG PO TABS
5.0000 mg | ORAL_TABLET | Freq: Two times a day (BID) | ORAL | 3 refills | Status: DC
Start: 2017-02-08 — End: 2017-02-08

## 2017-02-08 MED ORDER — DABIGATRAN ETEXILATE MESYLATE 150 MG PO CAPS: 150.0000 mg | ORAL_CAPSULE | Freq: Two times a day (BID) | ORAL | 11 refills | Status: DC

## 2017-02-08 NOTE — Telephone Encounter (Signed)
Spoke with pt. She did not want to take the Eliquis due to price. She wants to be switched back to Pradaxa. Spoke with Dr. Lovena Le and he agreed she could stay on Pradaxa.

## 2017-02-08 NOTE — Telephone Encounter (Signed)
Please call patient regardign medication change from this morning. / tg

## 2017-02-08 NOTE — Progress Notes (Signed)
HPI Kelsey Sandoval returns today for ongoing evaluation and management of her DDD PM. She is a pleasant 83 yo woman with sinus node dysfunction and HTN who underwent PPM insertion 6 years ago. She also has now persistent atrial fib and is on systemic anti-coagulation. She denies chest pain but she has recently had more sob. Review of her PPM interogation demonstrates that she has been in atrial fib for the past several months. Her rates are in the 100 range much of the time. Allergies  Allergen Reactions  . Penicillins Other (See Comments)    Caused patient to pass out.Can take cephalosporins Has patient had a PCN reaction causing immediate rash, facial/tongue/throat swelling, SOB or lightheadedness with hypotension: no Has patient had a PCN reaction causing severe rash involving mucus membranes or skin necrosis: No Has patient had a PCN reaction that required hospitalization No Has patient had a PCN reaction occurring within the last 10 years: No If all of the above answers are "NO", then may proceed with Cephalosporin use.   . Levaquin [Levofloxacin] Nausea Only  . Sulfa Antibiotics Other (See Comments)    Unknown  . Zithromax [Azithromycin] Nausea Only and Rash     Current Outpatient Prescriptions  Medication Sig Dispense Refill  . acetaminophen (TYLENOL) 650 MG CR tablet Take 1,300 mg by mouth every 8 (eight) hours as needed for pain.    Marland Kitchen albuterol (PROVENTIL HFA;VENTOLIN HFA) 108 (90 BASE) MCG/ACT inhaler Inhale 2 puffs into the lungs every 6 (six) hours as needed for wheezing or shortness of breath. 18 g 5  . ALPRAZolam (XANAX) 0.5 MG tablet 1 qhs prn insomnia 30 tablet 4  . amLODipine (NORVASC) 10 MG tablet Take 1 tablet (10 mg total) by mouth daily. 30 tablet 5  . busPIRone (BUSPAR) 10 MG tablet Take 1 tablet (10 mg total) by mouth 3 (three) times daily. 90 tablet 3  . dabigatran (PRADAXA) 150 MG CAPS capsule Take 1 capsule (150 mg total) by mouth 2 (two) times daily. 180  capsule 3  . DULoxetine (CYMBALTA) 60 MG capsule Take 1 capsule (60 mg total) by mouth 2 (two) times daily. 60 capsule 3  . gabapentin (NEURONTIN) 300 MG capsule TAKE ONE CAPSULE BY MOUTH THREE TIMES DAILY 90 capsule 1  . hydrALAZINE (APRESOLINE) 25 MG tablet Take 1 tablet (25 mg total) by mouth every 8 (eight) hours. 90 tablet 0  . HYDROcodone-acetaminophen (NORCO) 7.5-325 MG tablet Take 1 tablet by mouth every 4 (four) hours as needed for moderate pain or severe pain. NO greater than 5 daily 150 tablet 0  . hydrOXYzine (ATARAX/VISTARIL) 10 MG tablet One bid prn nausea, caution drowsiness 40 tablet 2  . levothyroxine (SYNTHROID, LEVOTHROID) 50 MCG tablet TAKE ONE TABLET BY MOUTH ONCE DAILY (CHANGE  IN  DOSE) 30 tablet 5  . lisinopril (PRINIVIL,ZESTRIL) 40 MG tablet Take 1 tablet (40 mg total) by mouth daily. 90 tablet 1  . metoprolol succinate (TOPROL-XL) 25 MG 24 hr tablet Take 3 tablets (75 mg total) by mouth daily. Take with or immediately following a meal. 90 tablet 0  . ondansetron (ZOFRAN-ODT) 8 MG disintegrating tablet Take 1 tablet (8 mg total) by mouth every 8 (eight) hours as needed for nausea or vomiting. 30 tablet 5  . pantoprazole (PROTONIX) 40 MG tablet Take 1 tablet (40 mg total) by mouth daily. Reported on 01/28/2016 30 tablet 12  . pravastatin (PRAVACHOL) 80 MG tablet Take 1 tablet (80 mg total) by mouth daily.  90 tablet 1  . traZODone (DESYREL) 50 MG tablet Take 0.5-1 tablets (25-50 mg total) by mouth at bedtime as needed for sleep. 30 tablet 3  . vitamin B-12 (CYANOCOBALAMIN) 1000 MCG tablet Take 1 tablet (1,000 mcg total) by mouth daily.     No current facility-administered medications for this visit.      Past Medical History:  Diagnosis Date  . Anxiety   . Arthritis   . Back pain, chronic    Sciatica  . Cataracts, bilateral   . Depression   . Essential hypertension   . Hyperlipidemia   . Membranous nephropathy determined by biopsy 06/07/2015  . Nephrotic syndrome     RHUX  . Neuropathy   . Osteopenia   . Pacemaker    Medtronic  . PAF (paroxysmal atrial fibrillation) ([place])   . Pre-diabetes   . Tachycardia-bradycardia syndrome ([place])     ROS:   All systems reviewed and negative except as noted in the HPI.   Past Surgical History:  Procedure Laterality Date  . ABDOMINAL HYSTERECTOMY     partial-pt has no ovaries  . APPENDECTOMY    . BACK SURGERY    . BIOPSY N/A 01/17/2014   Procedure: BIOPSY;  Surgeon: Rogene Houston, MD;  Location: [facility];  Service: Endoscopy;  Laterality: N/A;  . CATARACT EXTRACTION W/PHACO Left 02/28/2015   Procedure: CATARACT EXTRACTION PHACO AND INTRAOCULAR LENS PLACEMENT (IOC);  Surgeon: [name], MD;  Location: [facility];  Service: Ophthalmology;  Laterality: Left;  CDE 18.71  . COLONOSCOPY  9/05  . COLONOSCOPY N/A 10/11/2013   Procedure: COLONOSCOPY;  Surgeon: Rogene Houston, MD;  Location: [facility];  Service: Endoscopy;  Laterality: N/A;  1200  . ESOPHAGOGASTRODUODENOSCOPY N/A 01/17/2014   Procedure: ESOPHAGOGASTRODUODENOSCOPY (EGD);  Surgeon: Rogene Houston, MD;  Location: [facility];  Service: Endoscopy;  Laterality: N/A;  230  . ESOPHAGOGASTRODUODENOSCOPY N/A 01/02/2016   Procedure: ESOPHAGOGASTRODUODENOSCOPY (EGD);  Surgeon: Rogene Houston, MD;  Location: [facility];  Service: Endoscopy;  Laterality: N/A;  240  . INSERT / REPLACE / REMOVE PACEMAKER     2012  . Wisdom tooth extracton       Family History  Problem Relation Age of Onset  . Colon cancer Brother   . Anxiety disorder Sister   . Depression Sister   . Anxiety disorder Sister   . Depression Sister      Social History   Social History  . Marital status: Divorced    Spouse name: N/A  . Number of children: N/A  . Years of education: N/A   Occupational History  . retired    Social History Main Topics  . Smoking status: Never Smoker  . Smokeless tobacco: Never Used  . Alcohol use No  . Drug use: No  . Sexual activity: Not  on file   Other Topics Concern  . Not on file   Social History Narrative  . No narrative on file     BP 120/64   Pulse 85   Ht 5\' 10"  (1.778 m)   Wt 163 lb (73.9 kg)   SpO2 96%   BMI 23.39 kg/m   Physical Exam:  Well appearing 83 yo woman, NAD HEENT: Unremarkable Neck:  6 cm JVD, no thyromegally Lymphatics:  No adenopathy Back:  No CVA tenderness Lungs:  Clear with no wheezes HEART:  IRegular irregular rhythm, no murmurs, no rubs, no clicks Abd:  soft, positive bowel sounds, no organomegally, no rebound,  no guarding Ext:  2 plus pulses, no edema, no cyanosis, no clubbing Skin:  No rashes no nodules Neuro:  CN II through XII intact, motor grossly intact   DEVICE  Normal device function.  See PaceArt for details.   Assess/Plan: 1. PAF - she is now in atria fib and her HR is not well controlled. We discussed the treatment options. She appears to have had breakthough of atrial fib on flecainide which was stopped. I have offered her either amiodarone, or uptitration of her beta blocker or AV node ablation. She would like to try increasing her beta blocker. 2. PPM - her medtronic DDD PM is working normally. Will recheck in several months. 3. HTN - her blood pressure is well controlled.  4. Chronic diastolic heart failure - she is class 2. Hopefully rate control will help improve her symptoms. She may have to have more diuretic.  Mikle Bosworth.D.

## 2017-02-08 NOTE — Patient Instructions (Addendum)
Medication Instructions:  STOP PRADAXA START ELIQUIS 5 MG TWO TIMES DAILY  INCREASE TOPROL XL TO 50 MG TWO TIMES DAILY   Labwork: NONE  Testing/Procedures: NONE  Follow-Up: Your physician wants you to follow-up in: 1 YEAR .  You will receive a reminder letter in the mail two months in advance. If you don't receive a letter, please call our office to schedule the follow-up appointment.   Any Other Special Instructions Will Be Listed Below (If Applicable).  Remote monitoring is used to monitor your Pacemaker of ICD from home. This monitoring reduces the number of office visits required to check your device to one time per year. It allows [place] to keep an eye on the functioning of your device to ensure it is working properly. You are scheduled for a device check from home on 05/10/2017. You may send your transmission at any time that day. If you have a wireless device, the transmission will be sent automatically. After your physician reviews your transmission, you will receive a postcard with your next transmission date.     If you need a refill on your cardiac medications before your next appointment, please call your pharmacy.

## 2017-02-13 ENCOUNTER — Other Ambulatory Visit: Payer: Self-pay | Admitting: Family Medicine

## 2017-02-16 ENCOUNTER — Ambulatory Visit (INDEPENDENT_AMBULATORY_CARE_PROVIDER_SITE_OTHER): Payer: Self-pay | Admitting: Internal Medicine

## 2017-02-24 ENCOUNTER — Ambulatory Visit (INDEPENDENT_AMBULATORY_CARE_PROVIDER_SITE_OTHER): Payer: PPO

## 2017-02-24 ENCOUNTER — Ambulatory Visit (INDEPENDENT_AMBULATORY_CARE_PROVIDER_SITE_OTHER): Payer: PPO | Admitting: Orthopaedic Surgery

## 2017-02-24 ENCOUNTER — Ambulatory Visit (HOSPITAL_COMMUNITY): Payer: Self-pay | Admitting: Psychiatry

## 2017-02-24 ENCOUNTER — Encounter: Payer: Self-pay | Admitting: Orthopaedic Surgery

## 2017-02-24 VITALS — BP 150/92 | HR 66 | Temp 97.3°F | Ht 70.0 in | Wt 168.0 lb

## 2017-02-24 DIAGNOSIS — M25561 Pain in right knee: Secondary | ICD-10-CM

## 2017-02-24 DIAGNOSIS — G8929 Other chronic pain: Secondary | ICD-10-CM | POA: Diagnosis not present

## 2017-02-24 NOTE — Progress Notes (Signed)
CC:  I have pain of my right knee. I would like an injection.  The patient has chronic pain of the right knee.  There is no recent trauma.  There is no redness.  Injections in the past have helped.  She has medial swelling of the knee consistent with a large hematoma.  She is on a blood thinner.  Her knee is completely worn out with DJD.  The knee has no redness, has an effusion and crepitus present.  ROM of the right knee is 0-65.  She is a candidate for a total knee. She has heart problems and is not a good surgical risk.  I have offered another opinion at one of the local medical [facility] and she will think about it.  Impression:  Chronic knee pain right  Return: keep regular appointment  PROCEDURE NOTE:  The patient requests injections of the right knee , verbal consent was obtained.  The right knee was prepped appropriately after time out was performed.   Sterile technique was observed and injection of 1 cc of Depo-Medrol 40 mg with several cc's of plain xylocaine. Anesthesia was provided by ethyl chloride and a 20-gauge needle was used to inject the knee area. The injection was tolerated well.  A band aid dressing was applied.  The patient was advised to apply ice later today and tomorrow to the injection sight as needed.  Electronically Signed Sanjuana Kava, MD 5/23/20184:26 PM

## 2017-02-26 ENCOUNTER — Emergency Department (HOSPITAL_COMMUNITY)
Admission: EM | Admit: 2017-02-26 | Discharge: 2017-02-27 | Disposition: A | Discharge status: Home / Self Care | Payer: PPO | Attending: Emergency Medicine | Admitting: Emergency Medicine

## 2017-02-26 ENCOUNTER — Encounter (HOSPITAL_COMMUNITY): Payer: Self-pay | Admitting: *Deleted

## 2017-02-26 ENCOUNTER — Emergency Department (HOSPITAL_COMMUNITY): Payer: PPO

## 2017-02-26 DIAGNOSIS — Z79899 Other long term (current) drug therapy: Secondary | ICD-10-CM | POA: Insufficient documentation

## 2017-02-26 DIAGNOSIS — I129 Hypertensive chronic kidney disease with stage 1 through stage 4 chronic kidney disease, or unspecified chronic kidney disease: Secondary | ICD-10-CM | POA: Diagnosis not present

## 2017-02-26 DIAGNOSIS — N183 Chronic kidney disease, stage 3 (moderate): Secondary | ICD-10-CM | POA: Diagnosis not present

## 2017-02-26 DIAGNOSIS — M79609 Pain in unspecified limb: Secondary | ICD-10-CM

## 2017-02-26 DIAGNOSIS — M7989 Other specified soft tissue disorders: Secondary | ICD-10-CM | POA: Diagnosis not present

## 2017-02-26 DIAGNOSIS — E039 Hypothyroidism, unspecified: Secondary | ICD-10-CM | POA: Insufficient documentation

## 2017-02-26 DIAGNOSIS — M79604 Pain in right leg: Secondary | ICD-10-CM | POA: Insufficient documentation

## 2017-02-26 LAB — COMPREHENSIVE METABOLIC PANEL
ALT: 19 U/L (ref 14–54)
AST: 27 U/L (ref 15–41)
Albumin: 3.6 g/dL (ref 3.5–5.0)
Alkaline Phosphatase: 74 U/L (ref 38–126)
Anion gap: 8 (ref 5–15)
BILIRUBIN TOTAL: 0.5 mg/dL (ref 0.3–1.2)
BUN: 31 mg/dL — AB (ref 6–20)
CO2: 25 mmol/L (ref 22–32)
Calcium: 8.9 mg/dL (ref 8.9–10.3)
Chloride: 106 mmol/L (ref 101–111)
Creatinine, Ser: 1.28 mg/dL — ABNORMAL HIGH (ref 0.44–1.00)
GFR, EST AFRICAN AMERICAN: 44 mL/min — AB (ref >60)
GFR, EST NON AFRICAN AMERICAN: 38 mL/min — AB (ref >60)
Glucose, Bld: 108 mg/dL — ABNORMAL HIGH (ref 65–99)
POTASSIUM: 4.3 mmol/L (ref 3.5–5.1)
Sodium: 139 mmol/L (ref 135–145)
TOTAL PROTEIN: 6.6 g/dL (ref 6.5–8.1)

## 2017-02-26 LAB — CBC WITH DIFFERENTIAL/PLATELET
Basophils Absolute: 0 10*3/uL (ref 0.0–0.1)
Basophils Relative: 0 %
EOS ABS: 0.1 10*3/uL (ref 0.0–0.7)
EOS PCT: 0 %
HCT: 35.3 % — ABNORMAL LOW (ref 36.0–46.0)
HEMOGLOBIN: 11.5 g/dL — AB (ref 12.0–15.0)
LYMPHS ABS: 2.3 10*3/uL (ref 0.7–4.0)
Lymphocytes Relative: 20 %
MCH: 30.6 pg (ref 26.0–34.0)
MCHC: 32.6 g/dL (ref 30.0–36.0)
MCV: 93.9 fL (ref 78.0–100.0)
MONO ABS: 1 10*3/uL (ref 0.1–1.0)
MONOS PCT: 9 %
NEUTROS PCT: 71 %
Neutro Abs: 8.2 10*3/uL — ABNORMAL HIGH (ref 1.7–7.7)
Platelets: 280 10*3/uL (ref 150–400)
RBC: 3.76 MIL/uL — ABNORMAL LOW (ref 3.87–5.11)
RDW: 14.1 % (ref 11.5–15.5)
WBC: 11.5 10*3/uL — ABNORMAL HIGH (ref 4.0–10.5)

## 2017-02-26 LAB — POC OCCULT BLOOD, ED: FECAL OCCULT BLD: NEGATIVE

## 2017-02-26 LAB — PROTIME-INR
INR: 1.25
PROTHROMBIN TIME: 15.8 s — AB (ref 11.4–15.2)

## 2017-02-26 MED ORDER — HYDROMORPHONE HCL 1 MG/ML IJ SOLN
1.0000 mg | Freq: Once | INTRAMUSCULAR | Status: AC
  Administered 2017-02-26: 1 mg via INTRAMUSCULAR
  Filled 2017-02-26: qty 1

## 2017-02-26 MED ORDER — MORPHINE SULFATE (PF) 4 MG/ML IV SOLN
4.0000 mg | Freq: Once | INTRAVENOUS | Status: AC
  Administered 2017-02-26: 4 mg via INTRAMUSCULAR
  Filled 2017-02-26: qty 1

## 2017-02-26 MED ORDER — ONDANSETRON 8 MG PO TBDP
8.0000 mg | ORAL_TABLET | Freq: Once | ORAL | Status: AC
  Administered 2017-02-26: 8 mg via ORAL
  Filled 2017-02-26: qty 1

## 2017-02-26 NOTE — ED Triage Notes (Signed)
Severe leg pain

## 2017-02-26 NOTE — ED Notes (Signed)
Right leg pain for the past few weeks. Was seen by ortho Wed, had steroid injection. Family states fluid was drawn off also. Pt needs surgery but not at this site per notes due to risk.

## 2017-02-26 NOTE — ED Provider Notes (Signed)
[facility] DEPT Provider Note   CSN: [id number] Arrival date & time: 02/26/17  1945   By signing my name below, I, Mayer Masker, attest that this documentation has been prepared under the direction and in the presence of Chanler Mendonca, Gwenyth Allegra, *. Electronically Signed: Mayer Masker, Scribe. 02/27/17. 12:02 AM.  History   Chief Complaint Chief Complaint  Patient presents with  . Leg Pain   The history is provided by the patient and a friend. No language interpreter was used.    HPI Comments: Kelsey Sandoval is a 83 y.o. female with a PMHx of Afib, neuropathy, and chronic right knee pain who presents to the Emergency Department complaining of constant, right-sided leg pain and swelling since 2 weeks. She states the pain radiates up her left leg all the way to her thigh. She takes hydrocodone every 4 hours for her pain relief with no relief. She has associated difficulty breathing that she says waxes and wanes with her Afib. She notes she is on Pradaxa for her Afib. She went to Dr. Luna Glasgow 2 days ago and he "tapped her knee and it was brown". She was given prednisone during this visit.   Past Medical History:  Diagnosis Date  . Anxiety   . Arthritis   . Back pain, chronic    Sciatica  . Cataracts, bilateral   . Depression   . Essential hypertension   . Hyperlipidemia   . Membranous nephropathy determined by biopsy 06/07/2015  . Nephrotic syndrome    RHUX  . Neuropathy   . Osteopenia   . Pacemaker    [employer]  . PAF (paroxysmal atrial fibrillation) ([place])   . Pre-diabetes   . Tachycardia-bradycardia syndrome [facility])     Patient Active Problem List   Diagnosis Date Noted  . Atrial fibrillation with RVR ([place]) 01/20/2017  . Nausea 01/20/2017  . Anxiety 01/20/2017  . Lobar pneumonia, unspecified organism ([place]) 09/11/2016  . Acute bronchitis 09/11/2016  . Chronic anticoagulation 09/07/2016  . Chronic kidney disease (CKD), stage III (moderate) 09/07/2016  . Hereditary and  idiopathic peripheral neuropathy 03/05/2016  . Major depression 12/04/2015  . Major depression in remission ([place]) 09/02/2015  . Membranous nephropathy determined by biopsy 06/07/2015  . Osteoarthritis of both knees 03/26/2015  . Chronic pain syndrome 03/26/2015  . Intractable nausea and vomiting   . Nausea with vomiting   . Malnutrition of moderate degree ([place]) 03/08/2015  . CAP (community acquired pneumonia) 03/08/2015  . UTI (lower urinary tract infection) 03/07/2015  . Hypothyroidism 02/14/2015  . Disorder of kidney 01/17/2015  . Nephrotic syndrome 06/21/2014  . Proteinuria 03/29/2014  . Osteopenia 03/29/2014  . Anemia, iron deficiency 08/22/2013  . Mitral insufficiency 06/11/2013  . Pacemaker 06/11/2013  . Paroxysmal atrial fibrillation ([place]) 09/26/2012  . Tachycardia-bradycardia syndrome ([place]) 09/26/2012  . Sick sinus syndrome ([place]) 09/26/2012  . Systemic hypertension 09/26/2012  . Dyslipidemia 09/26/2012    Past Surgical History:  Procedure Laterality Date  . ABDOMINAL HYSTERECTOMY     partial-pt has no ovaries  . APPENDECTOMY    . BACK SURGERY    . BIOPSY N/A 01/17/2014   Procedure: BIOPSY;  Surgeon: Rogene Houston, MD;  Location: [facility] ENDO SUITE;  Service: Endoscopy;  Laterality: N/A;  . CATARACT EXTRACTION W/PHACO Left 02/28/2015   Procedure: CATARACT EXTRACTION PHACO AND INTRAOCULAR LENS PLACEMENT (IOC);  Surgeon: [name], MD;  Location: [facility] ORS;  Service: Ophthalmology;  Laterality: Left;  CDE 18.71  . COLONOSCOPY  9/05  . COLONOSCOPY N/A 10/11/2013  Procedure: COLONOSCOPY;  Surgeon: Rogene Houston, MD;  Location: [facility] ENDO SUITE;  Service: Endoscopy;  Laterality: N/A;  1200  . ESOPHAGOGASTRODUODENOSCOPY N/A 01/17/2014   Procedure: ESOPHAGOGASTRODUODENOSCOPY (EGD);  Surgeon: Rogene Houston, MD;  Location: [facility] ENDO SUITE;  Service: Endoscopy;  Laterality: N/A;  230  . ESOPHAGOGASTRODUODENOSCOPY N/A 01/02/2016   Procedure: ESOPHAGOGASTRODUODENOSCOPY (EGD);  Surgeon:  Rogene Houston, MD;  Location: [facility] ENDO SUITE;  Service: Endoscopy;  Laterality: N/A;  240  . INSERT / REPLACE / REMOVE PACEMAKER     2012  . Wisdom tooth extracton      OB History    Gravida Para Term Preterm AB Living   2 2 2          SAB TAB Ectopic Multiple Live Births                   Home Medications    Prior to Admission medications   Medication Sig Start Date End Date Taking? Authorizing Provider  acetaminophen (TYLENOL) 650 MG CR tablet Take 1,300 mg by mouth every 8 (eight) hours as needed for pain.   Yes [provider]  albuterol (PROVENTIL HFA;VENTOLIN HFA) 108 (90 BASE) MCG/ACT inhaler Inhale 2 puffs into the lungs every 6 (six) hours as needed for wheezing or shortness of breath. 09/17/14  Yes Kathyrn Drown, MD  ALPRAZolam Duanne Moron) 0.5 MG tablet 1 qhs prn insomnia Patient taking differently: Take 1 mg by mouth at bedtime.  02/04/17  Yes Kathyrn Drown, MD  amLODipine (NORVASC) 10 MG tablet Take 1 tablet (10 mg total) by mouth daily. 01/04/17  Yes Luking, Elayne Snare, MD  busPIRone (BUSPAR) 10 MG tablet Take 1 tablet (10 mg total) by mouth 3 (three) times daily. 10/27/16  Yes Cloria Spring, MD  dabigatran (PRADAXA) 150 MG CAPS capsule Take 1 capsule (150 mg total) by mouth 2 (two) times daily. 02/08/17  Yes Evans Lance, MD  DULoxetine (CYMBALTA) 60 MG capsule Take 1 capsule (60 mg total) by mouth 2 (two) times daily. 10/27/16 10/27/17 Yes Cloria Spring, MD  hydrALAZINE (APRESOLINE) 25 MG tablet Take 1 tablet (25 mg total) by mouth every 8 (eight) hours. Patient taking differently: Take 25 mg by mouth 2 (two) times daily.  01/21/17  Yes Kathie Dike, MD  HYDROcodone-acetaminophen (NORCO) 7.5-325 MG tablet Take 1 tablet by mouth every 4 (four) hours as needed for moderate pain or severe pain. NO greater than 5 daily 02/04/17  Yes Luking, Elayne Snare, MD  hydrOXYzine (ATARAX/VISTARIL) 10 MG tablet One bid prn nausea, caution drowsiness Patient taking differently: Take 10  mg by mouth 2 (two) times daily as needed for nausea.  01/19/17  Yes Luking, Elayne Snare, MD  levothyroxine (SYNTHROID, LEVOTHROID) 50 MCG tablet TAKE ONE TABLET BY MOUTH ONCE DAILY (CHANGE  IN  DOSE) 02/02/17  Yes Luking, Scott A, MD  lisinopril (PRINIVIL,ZESTRIL) 40 MG tablet Take 1 tablet (40 mg total) by mouth daily. 01/19/17  Yes Kathyrn Drown, MD  metoprolol succinate (TOPROL XL) 50 MG 24 hr tablet Take 1 tablet (50 mg total) by mouth 2 (two) times daily. Take with or immediately following a meal. 02/08/17  Yes Evans Lance, MD  ondansetron (ZOFRAN-ODT) 8 MG disintegrating tablet Take 1 tablet (8 mg total) by mouth every 8 (eight) hours as needed for nausea or vomiting. 10/06/16  Yes Kathyrn Drown, MD  pantoprazole (PROTONIX) 40 MG tablet TAKE ONE TABLET BY MOUTH ONCE DAILY 02/15/17  Yes Luking,  Scott A, MD  pravastatin (PRAVACHOL) 80 MG tablet Take 1 tablet (80 mg total) by mouth daily. 01/19/17  Yes Kathyrn Drown, MD  traZODone (DESYREL) 50 MG tablet Take 0.5-1 tablets (25-50 mg total) by mouth at bedtime as needed for sleep. Patient taking differently: Take 25 mg by mouth at bedtime.  01/25/17  Yes Kathyrn Drown, MD  vitamin B-12 (CYANOCOBALAMIN) 1000 MCG tablet Take 1 tablet (1,000 mcg total) by mouth daily. 03/11/15  Yes Kathie Dike, MD  gabapentin (NEURONTIN) 600 MG tablet Take 1 tablet (600 mg total) by mouth 2 (two) times daily. 02/27/17   Orpah Greek, MD  oxyCODONE-acetaminophen (PERCOCET) 5-325 MG tablet Take 2 tablets by mouth every 4 (four) hours as needed. 02/27/17   Orpah Greek, MD    Family History Family History  Problem Relation Age of Onset  . Colon cancer Brother   . Anxiety disorder Sister   . Depression Sister   . Anxiety disorder Sister   . Depression Sister     Social History Social History  Substance Use Topics  . Smoking status: Never Smoker  . Smokeless tobacco: Never Used  . Alcohol use No     Allergies   Penicillins; Levaquin  [levofloxacin]; Sulfa antibiotics; and Zithromax [azithromycin]   Review of Systems Review of Systems  Respiratory: Positive for shortness of breath.   Cardiovascular: Positive for leg swelling.  Musculoskeletal: Positive for myalgias.  All other systems reviewed and are negative.    Physical Exam Updated Vital Signs BP (!) 140/92 (BP Location: Right Arm)   Pulse 74   Temp 98.2 F (36.8 C) (Oral)   Resp 20   Wt 76.2 kg (168 lb)   SpO2 93%   BMI 24.11 kg/m   Physical Exam  Constitutional: She is oriented to person, place, and time. She appears well-developed and well-nourished. No distress.  HENT:  Head: Normocephalic and atraumatic.  Right Ear: Hearing normal.  Left Ear: Hearing normal.  Nose: Nose normal.  Mouth/Throat: Oropharynx is clear and moist and mucous membranes are normal.  Eyes: Conjunctivae and EOM are normal. Pupils are equal, round, and reactive to light.  Neck: Normal range of motion. Neck supple.  Cardiovascular: Regular rhythm, S1 normal and S2 normal.  Exam reveals no gallop and no friction rub.   No murmur heard. Pulmonary/Chest: Effort normal and breath sounds normal. No respiratory distress. She exhibits no tenderness.  Abdominal: Soft. Normal appearance and bowel sounds are normal. There is no hepatosplenomegaly. There is no tenderness. There is no rebound, no guarding, no tenderness at McBurney's point and negative Murphy's sign. No hernia.  Musculoskeletal: Normal range of motion.  Knee has 90 degrees of flexion without obvious effusion  Minimal tenderness No erythema or warmth Tenderness on the distal lower leg soft tissues but not underlying skin changes  Neurological: She is alert and oriented to person, place, and time. She has normal strength. No cranial nerve deficit or sensory deficit. Coordination normal. GCS eye subscore is 4. GCS verbal subscore is 5. GCS motor subscore is 6.  Skin: Skin is warm, dry and intact. No rash noted. No cyanosis.   Psychiatric: She has a normal mood and affect. Her speech is normal and behavior is normal. Thought content normal.  Nursing note and vitals reviewed.    ED Treatments / Results  DIAGNOSTIC STUDIES: Oxygen Saturation is 96% on RA, normal by my interpretation.    COORDINATION OF CARE: 11:28 PM Discussed treatment plan with pt at  bedside and pt agreed to plan.  Labs (all labs ordered are listed, but only abnormal results are displayed) Labs Reviewed  CBC WITH DIFFERENTIAL/PLATELET - Abnormal; Notable for the following:       Result Value   WBC 11.5 (*)    RBC 3.76 (*)    Hemoglobin 11.5 (*)    HCT 35.3 (*)    Neutro Abs 8.2 (*)    All other components within normal limits  PROTIME-INR - Abnormal; Notable for the following:    Prothrombin Time 15.8 (*)    All other components within normal limits  COMPREHENSIVE METABOLIC PANEL - Abnormal; Notable for the following:    Glucose, Bld 108 (*)    BUN 31 (*)    Creatinine, Ser 1.28 (*)    GFR calc non Af Amer 38 (*)    GFR calc Af Amer 44 (*)    All other components within normal limits  POC OCCULT BLOOD, ED    EKG  EKG Interpretation None       Radiology Dg Knee Complete 4 Views Right  Result Date: 02/27/2017 CLINICAL DATA:  Acute onset of severe right leg pain and swelling. Initial encounter. EXAM: RIGHT KNEE - COMPLETE 4+ VIEW COMPARISON:  Right knee radiographs performed 02/24/2017 FINDINGS: There is no evidence of fracture or dislocation. Mild medial compartment narrowing is noted, with marginal osteophytes noted at all 3 compartments. No significant joint effusion is seen. Scattered vascular calcifications are seen. Diffuse soft tissue swelling is noted about the medial aspect of the knee. IMPRESSION: 1. No evidence of fracture or dislocation. 2. Tricompartmental osteoarthritis, with mild medial compartment narrowing. 3. Scattered vascular calcifications. 4. Diffuse soft tissue swelling about the medial aspect of the  knee. Underlying hematoma cannot be excluded. Electronically Signed   By: Garald Balding M.D.   On: 02/27/2017 00:11    Procedures Procedures (including critical care time)  Medications Ordered in ED Medications  morphine 4 MG/ML injection 4 mg (4 mg Intramuscular Given 02/26/17 2157)  HYDROmorphone (DILAUDID) injection 1 mg (1 mg Intramuscular Given 02/26/17 2335)  ondansetron (ZOFRAN-ODT) disintegrating tablet 8 mg (8 mg Oral Given 02/26/17 2337)     Initial Impression / Assessment and Plan / ED Course  I have reviewed the triage vital signs and the nursing notes.  Pertinent labs & imaging results that were available during my care of the patient were reviewed by me and considered in my medical decision making (see chart for details).     Patient presents to the ER for evaluation of leg pain. Patient was seen by Dr. Luna Glasgow, orthopedics this week for right knee pain. His note indicates that he diagnosed a moderate hematoma of the medial aspect of the knee. He injected her knee with Solu-Medrol. Examining her knee now does not reveal any evidence of hematoma. This has significantly improved apparently. She has normal range of motion of the knee and the knee is nontender. There is therefore no concern for septic arthritis of the knee. Patient now complaining of pain on the lateral aspects of the lower leg. This does not include the ankle, no ankle tenderness, swelling or joint effusion. Patient does have a history of neuropathy. Family is concerned that she is "bleeding somewhere". She does have a drop in her hemoglobin, likely secondary to the hematoma she had. There is no source of bleeding currently. This includes heme-negative stools. She is complaining of increased pain, was treated with Dilaudid here in the ER with some improvement. Will  provide her with a dispense pack of Percocet to use in place of her Lortab for tonight. Increase the Neurontin from 300 mg twice daily to 600 mg twice daily  and follow-up with Dr. Luna Glasgow and her primary care doctor.  Final Clinical Impressions(s) / ED Diagnoses   Final diagnoses:  Musculoskeletal pain of extremity    New Prescriptions New Prescriptions   GABAPENTIN (NEURONTIN) 600 MG TABLET    Take 1 tablet (600 mg total) by mouth 2 (two) times daily.   OXYCODONE-ACETAMINOPHEN (PERCOCET) 5-325 MG TABLET    Take 2 tablets by mouth every 4 (four) hours as needed.  I personally performed the services described in this documentation, which was scribed in my presence. The recorded information has been reviewed and is accurate.      Orpah Greek, MD 02/27/17 [phone]

## 2017-02-26 NOTE — ED Triage Notes (Signed)
Pt reports on blood thinner and swelling to her R leg- States Dr Luna Glasgow remarked that it might be from her predaxa- Seen by Dr Luna Glasgow on Weds  Dr Lance Sell is her PCP

## 2017-02-26 NOTE — ED Notes (Signed)
Family member [name] called again. Was advised to is still in the ED & MD is making rounds. Family concerned about blood clot although pt is on blood thinners. Requesting family call her. Message given to family.

## 2017-02-27 MED ORDER — OXYCODONE-ACETAMINOPHEN 5-325 MG PO TABS: 2.0000 | ORAL_TABLET | ORAL | 0 refills | Status: DC | PRN

## 2017-02-27 MED ORDER — GABAPENTIN 600 MG PO TABS: 600.0000 mg | ORAL_TABLET | Freq: Two times a day (BID) | ORAL | 0 refills | Status: DC

## 2017-02-27 NOTE — ED Notes (Signed)
Pt alert & oriented x4. Patient given discharge instructions, paperwork & prescription(s). Patient informed not to drive, operate any equipment & handel any important documents 4 hours after taking pain medication. Patient verbalized understanding. Pt left department w/ no further questions.

## 2017-03-03 DIAGNOSIS — M5416 Radiculopathy, lumbar region: Secondary | ICD-10-CM | POA: Diagnosis not present

## 2017-03-04 ENCOUNTER — Telehealth: Payer: Self-pay

## 2017-03-04 ENCOUNTER — Telehealth: Payer: Self-pay | Admitting: Family Medicine

## 2017-03-04 ENCOUNTER — Other Ambulatory Visit: Payer: Self-pay | Admitting: *Deleted

## 2017-03-04 MED ORDER — HYDRALAZINE HCL 25 MG PO TABS: 25.0000 mg | ORAL_TABLET | Freq: Two times a day (BID) | ORAL | 5 refills | Status: DC

## 2017-03-04 NOTE — Telephone Encounter (Signed)
Sent clearance documents to medical records to be faxed to [facility] [phone]. Dr. Lenon Ahmadi - "May hold Pradaxa 3 days. Restart when you deem appropriate".

## 2017-03-04 NOTE — Telephone Encounter (Signed)
Patient is needing a refill on her hydralazine to [facility].  She is completely out.

## 2017-03-04 NOTE — Telephone Encounter (Addendum)
Prescription refill request sent by pharmacy. Prescription refill authorized per protocol and sent electronically to pharmacy. Patient notified.

## 2017-03-04 NOTE — Telephone Encounter (Signed)
Please verify with patient how she is taking then may order 5 refills to reflect how she is taking it

## 2017-03-05 MED FILL — Oxycodone w/ Acetaminophen Tab 5-325 MG: ORAL | Qty: 6 | Status: AC

## 2017-03-10 ENCOUNTER — Encounter: Payer: Self-pay | Admitting: Orthopaedic Surgery

## 2017-03-10 ENCOUNTER — Ambulatory Visit (INDEPENDENT_AMBULATORY_CARE_PROVIDER_SITE_OTHER): Payer: PPO | Admitting: Orthopaedic Surgery

## 2017-03-10 VITALS — BP 114/72 | HR 94 | Ht 70.0 in | Wt 166.0 lb

## 2017-03-10 DIAGNOSIS — M25562 Pain in left knee: Secondary | ICD-10-CM

## 2017-03-10 DIAGNOSIS — G8929 Other chronic pain: Secondary | ICD-10-CM

## 2017-03-10 NOTE — Progress Notes (Signed)
CC:  I have pain of my left knee. I would like an injection.  The patient has chronic pain of the left knee.  There is no recent trauma.  There is no redness.  Injections in the past have helped.  The knee has no redness, has an effusion and crepitus present.  ROM of the left knee is 0-105.  Impression:  Chronic knee pain left  Return: prn  PROCEDURE NOTE:  The patient requests injections of the left knee, verbal consent was obtained.  The left knee was prepped appropriately after time out was performed.   Sterile technique was observed and injection of 1 cc of Depo-Medrol 40 mg with several cc's of plain xylocaine. Anesthesia was provided by ethyl chloride and a 20-gauge needle was used to inject the knee area. The injection was tolerated well.  A band aid dressing was applied.  The patient was advised to apply ice later today and tomorrow to the injection sight as needed.  Electronically Signed Sanjuana Kava, MD 6/6/20181:43 PM

## 2017-03-22 ENCOUNTER — Other Ambulatory Visit (HOSPITAL_COMMUNITY): Payer: Self-pay | Admitting: Psychiatry

## 2017-03-29 ENCOUNTER — Encounter: Payer: Self-pay | Admitting: Family Medicine

## 2017-03-29 ENCOUNTER — Ambulatory Visit (INDEPENDENT_AMBULATORY_CARE_PROVIDER_SITE_OTHER): Payer: PPO | Admitting: Family Medicine

## 2017-03-29 VITALS — BP 134/80 | Ht 70.0 in | Wt 163.0 lb

## 2017-03-29 DIAGNOSIS — G894 Chronic pain syndrome: Secondary | ICD-10-CM

## 2017-03-29 MED ORDER — ALPRAZOLAM 0.5 MG PO TABS: ORAL_TABLET | ORAL | 3 refills | Status: DC

## 2017-03-29 MED ORDER — HYDROCODONE-ACETAMINOPHEN 10-325 MG PO TABS: ORAL_TABLET | ORAL | 0 refills | Status: DC

## 2017-03-29 NOTE — Progress Notes (Signed)
   Subjective:    Patient ID: Kelsey Sandoval, female    DOB: [birth year], 83 y.o.   MRN: [id number]  HPI This patient was seen today for chronic pain  The medication list was reviewed and updated.   -Compliance with medication: yes  - Number patient states they take daily: 4 a day  -when was the last dose patient took? today  The patient was advised the importance of maintaining medication and not using illegal substances with these.  Refills needed: yes  The patient was educated that we can provide 3 monthly scripts for their medication, it is their responsibility to follow the instructions.  Side effects or complications from medications: none  Patient is aware that pain medications are meant to minimize the severity of the pain to allow their pain levels to improve to allow for better function. They are aware of that pain medications cannot totally remove their pain.  Due for UDT ( at least once per year) : last one 11/09/16  Saw Dr Daryll Drown of [facility]- had shots    Patient relates pain discomfort in the lower back region in addition this sciatica right leg also relates right hip pain right knee pain. She is concerned that she has a second issue going on from a orthopedic point of view causing the leg pain. She would like to be seen by orthopedics. She states her specialist who does injections Dr. Brien Few recommended an orthopedist she is uncertain of the name. It was her impression that he was setting her up.   Review of Systems  Constitutional: Negative for activity change, fatigue and fever.  Respiratory: Negative for cough and shortness of breath.   Cardiovascular: Negative for chest pain and leg swelling.  Neurological: Negative for headaches.       Objective:   Physical Exam  Constitutional: She appears well-nourished. No distress.  HENT:  Head: Normocephalic.  Cardiovascular: Normal rate and normal heart sounds.   No murmur heard. Pulmonary/Chest: Effort normal and  breath sounds normal.  Musculoskeletal: She exhibits no edema.  Lymphadenopathy:    She has no cervical adenopathy.  Neurological: She is alert.  Psychiatric: Her behavior is normal.  Vitals reviewed.         Assessment & Plan:  Back pain with sciatica. Patient states pain medicine not doing enough to alleviate the pain. She would like to be on a higher dose to see if this would help. She denies abusing the medicine. She states it does not cause drowsiness. We will increase the dose of 10 mg/325 one every 4 hours when necessary severe pain caution drowsiness if it does cause drowsiness do not drive, also report to [place] because we may need to change her medicine back 3 prescriptions were given. Drug registry was checked She will follow-up in approximately 3 months  Insomnia patient has a fair amount of anxiousness difficult time relaxing his been on Xanax for years we will reduce the dose to 0.5 mg daily at bedtime it was explained to the patient that this would be safer. She is been counseled in the past 2 minimize and potentially stop this medicine she does not feel she can do so.  No comprehensive lab work necessary on today's visit

## 2017-04-05 ENCOUNTER — Telehealth: Payer: Self-pay | Admitting: Orthopedic Surgery

## 2017-04-13 ENCOUNTER — Telehealth: Payer: Self-pay

## 2017-04-13 NOTE — Telephone Encounter (Signed)
Surgical clearance faxed to [facility].

## 2017-04-16 ENCOUNTER — Other Ambulatory Visit: Payer: Self-pay | Admitting: *Deleted

## 2017-04-16 MED ORDER — GABAPENTIN 600 MG PO TABS: 600.0000 mg | ORAL_TABLET | Freq: Two times a day (BID) | ORAL | 0 refills | Status: DC

## 2017-04-21 DIAGNOSIS — M5416 Radiculopathy, lumbar region: Secondary | ICD-10-CM | POA: Diagnosis not present

## 2017-04-21 DIAGNOSIS — M5126 Other intervertebral disc displacement, lumbar region: Secondary | ICD-10-CM | POA: Diagnosis not present

## 2017-04-21 DIAGNOSIS — I10 Essential (primary) hypertension: Secondary | ICD-10-CM | POA: Diagnosis not present

## 2017-04-29 DIAGNOSIS — M25561 Pain in right knee: Secondary | ICD-10-CM | POA: Diagnosis not present

## 2017-04-29 DIAGNOSIS — M25562 Pain in left knee: Secondary | ICD-10-CM | POA: Diagnosis not present

## 2017-04-29 DIAGNOSIS — M170 Bilateral primary osteoarthritis of knee: Secondary | ICD-10-CM | POA: Diagnosis not present

## 2017-04-29 DIAGNOSIS — G8929 Other chronic pain: Secondary | ICD-10-CM | POA: Diagnosis not present

## 2017-05-10 ENCOUNTER — Encounter: Payer: PPO | Admitting: *Deleted

## 2017-05-12 ENCOUNTER — Encounter: Payer: Self-pay | Admitting: Cardiology

## 2017-05-18 ENCOUNTER — Ambulatory Visit (INDEPENDENT_AMBULATORY_CARE_PROVIDER_SITE_OTHER): Payer: PPO | Admitting: *Deleted

## 2017-05-18 DIAGNOSIS — Z950 Presence of cardiac pacemaker: Secondary | ICD-10-CM

## 2017-05-18 DIAGNOSIS — I495 Sick sinus syndrome: Secondary | ICD-10-CM

## 2017-05-18 DIAGNOSIS — I480 Paroxysmal atrial fibrillation: Secondary | ICD-10-CM | POA: Diagnosis not present

## 2017-05-19 ENCOUNTER — Other Ambulatory Visit: Payer: Self-pay | Admitting: Family Medicine

## 2017-05-19 NOTE — Telephone Encounter (Signed)
I also did this prescription when the patient was present in June somehow notify the pharmacy redo it if necessary please see June's prescription thank you

## 2017-05-20 NOTE — Progress Notes (Signed)
Remote pacemaker transmission.   

## 2017-05-24 ENCOUNTER — Telehealth: Payer: Self-pay | Admitting: *Deleted

## 2017-05-24 NOTE — Telephone Encounter (Signed)
Fax from [facility]. Ondansetron odt 8mg  tablet denied. Please see letter in dr scott's folder.

## 2017-05-24 NOTE — Telephone Encounter (Signed)
Kelsey Sandoval-please inform the patient that Zofran is not covered by her insurance.(You can feel free to let her know that they will not cover it only in situations where person is having chemotherapy etc.) Phenergan may be used but it typically causes fairly severe sedation. If the patient desires to have a short prescription of this on hand for home use only we can do this but it could only be used for nausea in the evening time

## 2017-05-25 ENCOUNTER — Telehealth: Payer: Self-pay | Admitting: Family Medicine

## 2017-05-25 MED ORDER — PROMETHAZINE HCL 25 MG PO TABS: 12.5000 mg | ORAL_TABLET | Freq: Two times a day (BID) | ORAL | 3 refills | Status: DC | PRN

## 2017-05-25 NOTE — Telephone Encounter (Signed)
Spoke to pt. She is aware zofran not covered. Would like phenergan called in [facility] [place]. Need dose and directions.

## 2017-05-25 NOTE — Telephone Encounter (Signed)
Prescription sent electronically to pharmacy. Patient notified. 

## 2017-05-25 NOTE — Telephone Encounter (Signed)
Phenergan 25 mg-use one half tablet twice a day when necessary nausea caution drowsiness #15 3 refills

## 2017-05-25 NOTE — Telephone Encounter (Signed)
Left message return call 05/25/17

## 2017-05-25 NOTE — Telephone Encounter (Signed)
I am concerned that is too much, I would recommend gabapentin 300 mg 1 pill 3 times daily #90 with 5 refills

## 2017-05-25 NOTE — Telephone Encounter (Signed)
Pt called stating that the hospital increased her gabapentin to 600 mg. Pt is wanting to know if the dr is wanting to keep it at that or go back to 300 mg. Pt is needing refills accordingly.

## 2017-05-26 MED ORDER — GABAPENTIN 300 MG PO CAPS: 300.0000 mg | ORAL_CAPSULE | Freq: Three times a day (TID) | ORAL | 5 refills | Status: DC

## 2017-05-26 NOTE — Telephone Encounter (Signed)
Spoke with patient and informed her per Dr.Scott Luking- Dr.Scott is concerned that is too much, he would recommend gabapentin 300 mg 1 pill 3 times daily. Patient verbalized understanding.

## 2017-05-27 LAB — CUP PACEART REMOTE DEVICE CHECK
Battery Remaining Longevity: 102 mo
Brady Statistic AS VS Percent: 95 %
Implantable Lead Implant Date: 20120810
Implantable Lead Implant Date: 20120810
Implantable Lead Location: 753860
Implantable Pulse Generator Implant Date: 20120810
Lead Channel Impedance Value: 443 Ohm
Lead Channel Pacing Threshold Amplitude: 0.875 V
Lead Channel Pacing Threshold Pulse Width: 0.4 ms
Lead Channel Setting Pacing Amplitude: 2 V
Lead Channel Setting Pacing Amplitude: 2.5 V
Lead Channel Setting Sensing Sensitivity: 4 mV
MDC IDC LEAD LOCATION: 753859
MDC IDC MSMT BATTERY IMPEDANCE: 322 Ohm
MDC IDC MSMT BATTERY VOLTAGE: 2.79 V
MDC IDC MSMT LEADCHNL RA PACING THRESHOLD AMPLITUDE: 1 V
MDC IDC MSMT LEADCHNL RA PACING THRESHOLD PULSEWIDTH: 0.4 ms
MDC IDC MSMT LEADCHNL RV IMPEDANCE VALUE: 710 Ohm
MDC IDC SESS DTM: 20180814194722
MDC IDC SET LEADCHNL RV PACING PULSEWIDTH: 0.4 ms
MDC IDC STAT BRADY AP VP PERCENT: 3 %
MDC IDC STAT BRADY AP VS PERCENT: 1 %
MDC IDC STAT BRADY AS VP PERCENT: 2 %

## 2017-06-01 ENCOUNTER — Encounter: Payer: Self-pay | Admitting: Cardiology

## 2017-06-14 ENCOUNTER — Ambulatory Visit (INDEPENDENT_AMBULATORY_CARE_PROVIDER_SITE_OTHER): Payer: PPO | Admitting: Family Medicine

## 2017-06-14 ENCOUNTER — Encounter: Payer: Self-pay | Admitting: Family Medicine

## 2017-06-14 VITALS — BP 136/88 | Temp 98.0°F | Ht 70.0 in | Wt 161.0 lb

## 2017-06-14 DIAGNOSIS — I951 Orthostatic hypotension: Secondary | ICD-10-CM

## 2017-06-14 DIAGNOSIS — D72829 Elevated white blood cell count, unspecified: Secondary | ICD-10-CM | POA: Diagnosis not present

## 2017-06-14 DIAGNOSIS — M4808 Spinal stenosis, sacral and sacrococcygeal region: Secondary | ICD-10-CM

## 2017-06-14 DIAGNOSIS — I480 Paroxysmal atrial fibrillation: Secondary | ICD-10-CM

## 2017-06-14 DIAGNOSIS — M48062 Spinal stenosis, lumbar region with neurogenic claudication: Secondary | ICD-10-CM

## 2017-06-14 DIAGNOSIS — G894 Chronic pain syndrome: Secondary | ICD-10-CM

## 2017-06-14 DIAGNOSIS — E038 Other specified hypothyroidism: Secondary | ICD-10-CM

## 2017-06-14 MED ORDER — AMLODIPINE BESYLATE 5 MG PO TABS: 5.0000 mg | ORAL_TABLET | Freq: Every day | ORAL | 5 refills | Status: DC

## 2017-06-14 MED ORDER — HYDROCODONE-ACETAMINOPHEN 10-325 MG PO TABS: ORAL_TABLET | ORAL | 0 refills | Status: DC

## 2017-06-14 MED ORDER — LISINOPRIL 20 MG PO TABS: 20.0000 mg | ORAL_TABLET | Freq: Every day | ORAL | 5 refills | Status: DC

## 2017-06-14 NOTE — Progress Notes (Signed)
   Subjective:    Patient ID: Kelsey Sandoval, female    DOB: 12-27-1932, 83 y.o.   MRN: [id number]  HPInausea for months. Insurance will not pay for zofran. Out of phenergan.  This patient relates a lot of fatigue tiredness low energy at times feeling depressed in addition to this feels nauseated on a regular basis but does not have abdominal pain she denies sweats chills fever dysuria rectal bleeding wheezing difficulty breathing she does relate low energy low appetite and low desire to do things. Weakness and fatigue for months.   She is on multiple medications. These is gathered over the years. She brings those in today to go over We went over each medicine one by one talked about the uses and potential side effects  Review of Systems Please see above    Objective:   Physical Exam Heart irregular rate controlled pulse normal BP does drop with standing abdomen soft neck no masses subjective discomfort in the lower back with subjected discomfort into bilateral legs trace edema in both legs skin warm dry purpura noted      25 minutes was spent with the patient. Greater than half the time was spent in discussion and answering questions and counseling regarding the issues that the patient came in for today.  Assessment & Plan:  This is a complex patient with multiple issues going on  Spinal stenosis with claudication patient is been suffering with low back pain and leg pain for years we have tried anti-inflammatories but she can no longer take anti-inflammatories because she is on a blood thinner. She is also been on gabapentin physical therapy as well as pain medication and has not responded patient can only walk approximately 50-70 feet without having to stop and rest because of back and leg pain therefore the pain is problem has progressed to the point where I recommend lumbar MRI with contrast because of previous back troubles-I suspect spinal stenosis with neurogenic  claudication  Hypertension-her blood pressure does drop with standing I recommend we reduce some of her medications because of her feeling dizzy upon standing reduce lisinopril new dose 20 mg, reduce amlodipine new dose 5 mg  Polly medicine-patient feeling fatigued and nauseated we will check lab work but we also need to reduce gabapentin because of this could be contributing to her symptoms  Pain medicine she denies abusing it states it does help her function she was given 1 additional prescription she will follow-up in 3 weeks. Ideally she would not have to be on so much pain medicine but she has tolerated this dose for years. And she does have significant neurologic and orthopedic problems causing her pain  Atrial fibrillation stable on current medication  Hyperparathyroidism stable on current medication check lab work  Previous CBC earlier this year showed leukocytosis repeat CBC to look at white blood count

## 2017-06-15 ENCOUNTER — Encounter: Payer: Self-pay | Admitting: Family Medicine

## 2017-06-15 ENCOUNTER — Encounter: Payer: Self-pay | Admitting: Cardiology

## 2017-06-15 LAB — CBC WITH DIFFERENTIAL/PLATELET
BASOS ABS: 0 10*3/uL (ref 0.0–0.2)
Basos: 0 %
EOS (ABSOLUTE): 0 10*3/uL (ref 0.0–0.4)
Eos: 0 %
HEMOGLOBIN: 14 g/dL (ref 11.1–15.9)
Hematocrit: 43.1 % (ref 34.0–46.6)
IMMATURE GRANS (ABS): 0 10*3/uL (ref 0.0–0.1)
Immature Granulocytes: 0 %
LYMPHS ABS: 2.8 10*3/uL (ref 0.7–3.1)
LYMPHS: 24 %
MCH: 29.5 pg (ref 26.6–33.0)
MCHC: 32.5 g/dL (ref 31.5–35.7)
MCV: 91 fL (ref 79–97)
MONOCYTES: 8 %
Monocytes Absolute: 1 10*3/uL — ABNORMAL HIGH (ref 0.1–0.9)
NEUTROS ABS: 7.7 10*3/uL — AB (ref 1.4–7.0)
Neutrophils: 68 %
Platelets: 272 10*3/uL (ref 150–379)
RBC: 4.75 x10E6/uL (ref 3.77–5.28)
RDW: 13.9 % (ref 12.3–15.4)
WBC: 11.5 10*3/uL — AB (ref 3.4–10.8)

## 2017-06-15 LAB — BASIC METABOLIC PANEL
BUN / CREAT RATIO: 18 (ref 12–28)
BUN: 22 mg/dL (ref 8–27)
CHLORIDE: 100 mmol/L (ref 96–106)
CO2: 22 mmol/L (ref 20–29)
CREATININE: 1.25 mg/dL — AB (ref 0.57–1.00)
Calcium: 9.4 mg/dL (ref 8.7–10.3)
GFR calc Af Amer: 46 mL/min/{1.73_m2} — ABNORMAL LOW (ref >59)
GFR calc non Af Amer: 40 mL/min/{1.73_m2} — ABNORMAL LOW (ref >59)
GLUCOSE: 71 mg/dL (ref 65–99)
POTASSIUM: 4 mmol/L (ref 3.5–5.2)
SODIUM: 138 mmol/L (ref 134–144)

## 2017-06-15 LAB — T4, FREE: FREE T4: 1.51 ng/dL (ref 0.82–1.77)

## 2017-06-15 LAB — TSH: TSH: 1.62 u[IU]/mL (ref 0.450–4.500)

## 2017-06-21 ENCOUNTER — Telehealth: Payer: Self-pay | Admitting: Family Medicine

## 2017-06-21 ENCOUNTER — Ambulatory Visit (HOSPITAL_COMMUNITY): Admission: RE | Admit: 2017-06-21 | Payer: PPO | Source: Ambulatory Visit

## 2017-06-21 DIAGNOSIS — R29898 Other symptoms and signs involving the musculoskeletal system: Secondary | ICD-10-CM

## 2017-06-21 DIAGNOSIS — M4808 Spinal stenosis, sacral and sacrococcygeal region: Secondary | ICD-10-CM

## 2017-06-21 DIAGNOSIS — M79604 Pain in right leg: Secondary | ICD-10-CM

## 2017-06-21 DIAGNOSIS — M79605 Pain in left leg: Principal | ICD-10-CM

## 2017-06-21 NOTE — Telephone Encounter (Signed)
Patient had an MRI scheduled for today at [facility] but they cancelled it because she has a Psychologist, forensic.  She was given a number to call in [place] to a place that may be able to do it with her pacemaker.  She wants to know what Dr. Nicki Reaper recommends.

## 2017-06-22 NOTE — Telephone Encounter (Signed)
Pt notified about the MRI. Kelsey Sandoval scheduling will call her back with appt. Also please see message below about referral

## 2017-06-22 NOTE — Telephone Encounter (Signed)
Discussed with pt. Called the number the pt provided which is the number to [facility] scheduling [phone]. She can have it done it [place]. They have to review her chart and make sure they have all the info they need on the pacemaker. Scheduling states they are unable to schedule until they get the ok from the tech. And scheduling will call her back with the appt. Left message to return call to let pt know

## 2017-06-22 NOTE — Telephone Encounter (Signed)
Patient called to check on this message.  She is requesting a call back today.

## 2017-06-22 NOTE — Telephone Encounter (Signed)
Pt wants to get referral for back and leg pain.

## 2017-06-22 NOTE — Telephone Encounter (Signed)
If the patient can get the number regarding where in [place] they can safely do an MRI with a pacemaker-then please give this to our nursing staff. Nurse's-please call that facility in find out can a patient do a MRI safely with a pacemaker? It is my understanding that they cannot and we may well have to do a CAT scan-please find out additional information

## 2017-06-22 NOTE — Telephone Encounter (Signed)
[facility] scheduling call back and after reviewing chart they are not able to do MRI. Left message to return call to let pt know

## 2017-06-23 ENCOUNTER — Other Ambulatory Visit: Payer: Self-pay

## 2017-06-23 DIAGNOSIS — I480 Paroxysmal atrial fibrillation: Secondary | ICD-10-CM | POA: Diagnosis not present

## 2017-06-23 DIAGNOSIS — J45909 Unspecified asthma, uncomplicated: Secondary | ICD-10-CM | POA: Diagnosis not present

## 2017-06-23 DIAGNOSIS — J181 Lobar pneumonia, unspecified organism: Secondary | ICD-10-CM | POA: Diagnosis not present

## 2017-06-23 DIAGNOSIS — N049 Nephrotic syndrome with unspecified morphologic changes: Secondary | ICD-10-CM | POA: Diagnosis not present

## 2017-06-23 MED ORDER — LISINOPRIL 20 MG PO TABS: 20.0000 mg | ORAL_TABLET | Freq: Every day | ORAL | 1 refills | Status: DC

## 2017-06-23 NOTE — Telephone Encounter (Signed)
[facility] scheduling call back and after reviewing chart they are not able to do MRI. Pt notified. Pt states she can go anytime for ct scan if that is what the order will be changed to. Also wants referral for back and leg pain. Please advise if you want to change the order to ct scan and if you want to do referral

## 2017-06-24 LAB — BASIC METABOLIC PANEL
BUN/Creatinine Ratio: 13 (ref 12–28)
BUN: 17 mg/dL (ref 8–27)
CALCIUM: 9.5 mg/dL (ref 8.7–10.3)
CHLORIDE: 104 mmol/L (ref 96–106)
CO2: 22 mmol/L (ref 20–29)
CREATININE: 1.28 mg/dL — AB (ref 0.57–1.00)
GFR, EST AFRICAN AMERICAN: 45 mL/min/{1.73_m2} — AB (ref >59)
GFR, EST NON AFRICAN AMERICAN: 39 mL/min/{1.73_m2} — AB (ref >59)
Glucose: 114 mg/dL — ABNORMAL HIGH (ref 65–99)
Potassium: 4.2 mmol/L (ref 3.5–5.2)
Sodium: 144 mmol/L (ref 134–144)

## 2017-06-24 NOTE — Telephone Encounter (Signed)
Spoke with patient and informed her per Dr.Scott Luking- we are going to order a CT lumbar spine. Patient verbalized understanding and CT is scheduled for 07/02/17 at 1300. Patient notified and verbalized understanding.

## 2017-06-24 NOTE — Telephone Encounter (Signed)
CT scan of the lumbar spine for spinal stenosis with bilateral leg pain and weakness unable to do MRI because of pacemaker

## 2017-06-30 ENCOUNTER — Other Ambulatory Visit: Payer: Self-pay | Admitting: Family Medicine

## 2017-07-01 NOTE — Telephone Encounter (Signed)
May refill each one of these for 6 months

## 2017-07-02 ENCOUNTER — Ambulatory Visit (HOSPITAL_COMMUNITY)
Admission: RE | Admit: 2017-07-02 | Discharge: 2017-07-02 | Disposition: A | Discharge status: Home / Self Care | Payer: PPO | Source: Ambulatory Visit | Attending: Family Medicine | Admitting: Family Medicine

## 2017-07-02 DIAGNOSIS — M79605 Pain in left leg: Secondary | ICD-10-CM | POA: Diagnosis not present

## 2017-07-02 DIAGNOSIS — M5136 Other intervertebral disc degeneration, lumbar region: Secondary | ICD-10-CM | POA: Diagnosis not present

## 2017-07-02 DIAGNOSIS — M79604 Pain in right leg: Secondary | ICD-10-CM | POA: Insufficient documentation

## 2017-07-02 DIAGNOSIS — M4808 Spinal stenosis, sacral and sacrococcygeal region: Secondary | ICD-10-CM | POA: Diagnosis not present

## 2017-07-02 DIAGNOSIS — M5126 Other intervertebral disc displacement, lumbar region: Secondary | ICD-10-CM | POA: Insufficient documentation

## 2017-07-02 DIAGNOSIS — M48061 Spinal stenosis, lumbar region without neurogenic claudication: Secondary | ICD-10-CM | POA: Diagnosis not present

## 2017-07-02 DIAGNOSIS — M778 Other enthesopathies, not elsewhere classified: Secondary | ICD-10-CM | POA: Insufficient documentation

## 2017-07-02 DIAGNOSIS — R29898 Other symptoms and signs involving the musculoskeletal system: Secondary | ICD-10-CM | POA: Insufficient documentation

## 2017-07-05 ENCOUNTER — Ambulatory Visit (INDEPENDENT_AMBULATORY_CARE_PROVIDER_SITE_OTHER): Payer: PPO | Admitting: Family Medicine

## 2017-07-05 ENCOUNTER — Encounter: Payer: Self-pay | Admitting: Family Medicine

## 2017-07-05 VITALS — BP 130/82 | Ht 70.0 in | Wt 161.0 lb

## 2017-07-05 DIAGNOSIS — M4808 Spinal stenosis, sacral and sacrococcygeal region: Secondary | ICD-10-CM

## 2017-07-05 DIAGNOSIS — Z23 Encounter for immunization: Secondary | ICD-10-CM | POA: Diagnosis not present

## 2017-07-05 MED ORDER — ALPRAZOLAM 0.5 MG PO TABS: ORAL_TABLET | ORAL | 3 refills | Status: DC

## 2017-07-05 MED ORDER — HYDROCODONE-ACETAMINOPHEN 10-325 MG PO TABS: ORAL_TABLET | ORAL | 0 refills | Status: DC

## 2017-07-05 MED ORDER — GABAPENTIN 300 MG PO CAPS: ORAL_CAPSULE | ORAL | 5 refills | Status: DC

## 2017-07-05 NOTE — Progress Notes (Signed)
   Subjective:    Patient ID: Kelsey Sandoval, female    DOB: Aug 08, 1933, 83 y.o.   MRN: [id number]  HPI  Patient arrives for a follow up on back pain and recent CTscan of back. We had a long discussion regarding her CT scan in her lab work She does have renal insufficiency she does take her medicines as directed tries to do a good job of eating She also has significant issues with low back pain with radiation into both legs uses a cane to walk around hydrocodone takes the edge off the pain gabapentin helps to some degree PMH benign   Review of Systems Denies any chest tightness pressure pain shortness breath nausea vomiting diarrhea relates back pain and leg pain    Objective:   Physical Exam 15 minutes was spent with patient discussing her lab work results and CAT scan results and discussing her pain management greater than half the discussion was answering questions and addressing patient's concerns  Drug registry was checked 2 prescriptions were written patient will follow-up in 6 weeks' time     Assessment & Plan:  Spinal stenosis with sciatica-unfortunately not able to get good pain relief but patient does not want to go on to oxycodone. Therefore increase hydrocodone 10 mg tablets 1 every 4 hours maximum 6 per day, also increase gabapentin 300 mg 1 in the morning one midday to evening. Follow-up patient in 6 weeks. Referral to neurosurgery for further evaluation. Complicated patient on blood thinners not sure if surgery would be beneficial but I do believe patient deserves an opinion.  She has seen Dr. Brien Few for injections and stated that that did not help.

## 2017-07-12 ENCOUNTER — Encounter: Payer: Self-pay | Admitting: Cardiovascular Disease

## 2017-07-12 ENCOUNTER — Ambulatory Visit (INDEPENDENT_AMBULATORY_CARE_PROVIDER_SITE_OTHER): Payer: PPO | Admitting: Cardiovascular Disease

## 2017-07-12 VITALS — BP 144/82 | HR 92 | Ht 70.0 in

## 2017-07-12 DIAGNOSIS — I10 Essential (primary) hypertension: Secondary | ICD-10-CM

## 2017-07-12 DIAGNOSIS — I4819 Other persistent atrial fibrillation: Secondary | ICD-10-CM

## 2017-07-12 DIAGNOSIS — Z950 Presence of cardiac pacemaker: Secondary | ICD-10-CM | POA: Diagnosis not present

## 2017-07-12 DIAGNOSIS — I481 Persistent atrial fibrillation: Secondary | ICD-10-CM | POA: Diagnosis not present

## 2017-07-12 DIAGNOSIS — E785 Hyperlipidemia, unspecified: Secondary | ICD-10-CM | POA: Diagnosis not present

## 2017-07-12 NOTE — Patient Instructions (Signed)
Medication Instructions:  Your physician recommends that you continue on your current medications as directed. Please refer to the Current Medication list given to you today.   Labwork: NONE   Testing/Procedures: NONE   Follow-Up: Your physician wants you to follow-up in: 6 Months with Dr. Koneswaran. You will receive a reminder letter in the mail two months in advance. If you don't receive a letter, please call our office to schedule the follow-up appointment.   Any Other Special Instructions Will Be Listed Below (If Applicable).     If you need a refill on your cardiac medications before your next appointment, please call your pharmacy.  Thank you for choosing [facility]!   

## 2017-07-12 NOTE — Progress Notes (Signed)
SUBJECTIVE: The patient presents for follow-up of persistent atrial fibrillation and accelerated hypertension. She also has a pacemaker implanted for tachycardia-bradycardia syndrome (implanted 05/15/11).  She denies palpitations, chest pain, and shortness of breath. Primary complaints relate to right leg sciatica. She is scheduled to see a spine specialist next week. She denies bleeding problems but does easily bruise.    Review of Systems: As per "subjective", otherwise negative.  Allergies  Allergen Reactions  . Penicillins Other (See Comments)    Caused patient to pass out.Can take cephalosporins Has patient had a PCN reaction causing immediate rash, facial/tongue/throat swelling, SOB or lightheadedness with hypotension: no Has patient had a PCN reaction causing severe rash involving mucus membranes or skin necrosis: No Has patient had a PCN reaction that required hospitalization No Has patient had a PCN reaction occurring within the last 10 years: No If all of the above answers are "NO", then may proceed with Cephalosporin use.   . Levaquin [Levofloxacin] Nausea Only  . Sulfa Antibiotics Other (See Comments)    Unknown  . Zithromax [Azithromycin] Nausea Only and Rash    Current Outpatient Prescriptions  Medication Sig Dispense Refill  . acetaminophen (TYLENOL) 650 MG CR tablet Take 1,300 mg by mouth every 8 (eight) hours as needed for pain.    Marland Kitchen albuterol (PROVENTIL HFA;VENTOLIN HFA) 108 (90 BASE) MCG/ACT inhaler Inhale 2 puffs into the lungs every 6 (six) hours as needed for wheezing or shortness of breath. 18 g 5  . ALPRAZolam (XANAX) 0.5 MG tablet Take one qhs 30 tablet 3  . amLODipine (NORVASC) 5 MG tablet Take 1 tablet (5 mg total) by mouth daily. 30 tablet 5  . busPIRone (BUSPAR) 10 MG tablet TAKE ONE TABLET BY MOUTH THREE TIMES DAILY 90 tablet 3  . dabigatran (PRADAXA) 150 MG CAPS capsule Take 1 capsule (150 mg total) by mouth 2 (two) times daily. 60 capsule 11  .  DULoxetine (CYMBALTA) 60 MG capsule Take 1 capsule (60 mg total) by mouth 2 (two) times daily. 60 capsule 3  . gabapentin (NEURONTIN) 300 MG capsule 1 in the am, 1 in the afternoon, and 2 in the evening 120 capsule 5  . hydrALAZINE (APRESOLINE) 25 MG tablet Take 1 tablet (25 mg total) by mouth 2 (two) times daily. 60 tablet 5  . HYDROcodone-acetaminophen (NORCO) 10-325 MG tablet Take one tablet every 4 hours prn pain. Max 6 tablets per day 180 tablet 0  . hydrOXYzine (ATARAX/VISTARIL) 10 MG tablet One bid prn nausea, caution drowsiness (Patient taking differently: Take 10 mg by mouth 2 (two) times daily as needed for nausea. ) 40 tablet 2  . levothyroxine (SYNTHROID, LEVOTHROID) 50 MCG tablet TAKE 1 TABLET BY MOUTH ONCE DAILY (CHANGE  IN  DOSE) 90 tablet 1  . lisinopril (PRINIVIL,ZESTRIL) 20 MG tablet Take 1 tablet (20 mg total) by mouth daily. 90 tablet 1  . metoprolol succinate (TOPROL XL) 50 MG 24 hr tablet Take 1 tablet (50 mg total) by mouth 2 (two) times daily. Take with or immediately following a meal. 180 tablet 3  . ondansetron (ZOFRAN-ODT) 8 MG disintegrating tablet Take 1 tablet (8 mg total) by mouth every 8 (eight) hours as needed for nausea or vomiting. 30 tablet 5  . pantoprazole (PROTONIX) 40 MG tablet TAKE ONE TABLET BY MOUTH ONCE DAILY 30 tablet 11  . pravastatin (PRAVACHOL) 80 MG tablet Take 1 tablet (80 mg total) by mouth daily. 90 tablet 1  . traZODone (DESYREL) 50 MG  tablet TAKE 1/2 TO 1 (ONE-HALF TO ONE) TABLET BY MOUTH AT BEDTIME AS NEEDED FOR SLEEP 30 tablet 5  . vitamin B-12 (CYANOCOBALAMIN) 1000 MCG tablet Take 1 tablet (1,000 mcg total) by mouth daily.     No current facility-administered medications for this visit.     Past Medical History:  Diagnosis Date  . Anxiety   . Arthritis   . Back pain, chronic    Sciatica  . Cataracts, bilateral   . Depression   . Essential hypertension   . Hyperlipidemia   . Membranous nephropathy determined by biopsy 06/07/2015  .  Nephrotic syndrome    RHUX  . Neuropathy   . Osteopenia   . Pacemaker    [employer]onic  . PAF (paroxysmal atrial fibrillation) ([place])   . Pre-diabetes   . Tachycardia-bradycardia syndrome [facility])     Past Surgical History:  Procedure Laterality Date  . ABDOMINAL HYSTERECTOMY     partial-pt has no ovaries  . APPENDECTOMY    . BACK SURGERY    . BIOPSY N/A 01/17/2014   Procedure: BIOPSY;  Surgeon: Rogene Houston, MD;  Location: [facility];  Service: Endoscopy;  Laterality: N/A;  . CATARACT EXTRACTION W/PHACO Left 02/28/2015   Procedure: CATARACT EXTRACTION PHACO AND INTRAOCULAR LENS PLACEMENT (IOC);  Surgeon: [name], MD;  Location: [facility] ORS;  Service: Ophthalmology;  Laterality: Left;  CDE 18.71  . COLONOSCOPY  9/05  . COLONOSCOPY N/A 10/11/2013   Procedure: COLONOSCOPY;  Surgeon: Rogene Houston, MD;  Location: [facility];  Service: Endoscopy;  Laterality: N/A;  1200  . ESOPHAGOGASTRODUODENOSCOPY N/A 01/17/2014   Procedure: ESOPHAGOGASTRODUODENOSCOPY (EGD);  Surgeon: Rogene Houston, MD;  Location: [facility];  Service: Endoscopy;  Laterality: N/A;  230  . ESOPHAGOGASTRODUODENOSCOPY N/A 01/02/2016   Procedure: ESOPHAGOGASTRODUODENOSCOPY (EGD);  Surgeon: Rogene Houston, MD;  Location: [facility];  Service: Endoscopy;  Laterality: N/A;  240  . INSERT / REPLACE / REMOVE PACEMAKER     2012  . Wisdom tooth extracton      Social History   Social History  . Marital status: Divorced    Spouse name: N/A  . Number of children: N/A  . Years of education: N/A   Occupational History  . retired    Social History Main Topics  . Smoking status: Never Smoker  . Smokeless tobacco: Never Used  . Alcohol use No  . Drug use: No  . Sexual activity: Not on file   Other Topics Concern  . Not on file   Social History Narrative  . No narrative on file     Vitals:   07/12/17 1318  BP: (!) 144/82  Pulse: 92  SpO2: 95%  Height: 5\' 10"  (1.778 m)    Wt Readings from Last 3  Encounters:  07/05/17 161 lb (73 kg)  06/14/17 161 lb (73 kg)  03/29/17 163 lb (73.9 kg)     PHYSICAL EXAM General: NAD HEENT: Normal. Neck: No JVD, no thyromegaly. Lungs: Clear to auscultation bilaterally with normal respiratory effort. CV: Nondisplaced PMI.  Regular rate and irregular rhythm, normal S1/S2, no S3, no murmur. No pretibial or periankle edema.  No carotid bruit.   Abdomen: Soft, nontender, no distention.  Neurologic: Alert and oriented.  Psych: Normal affect. Skin: Normal. Musculoskeletal: No gross deformities.    ECG: Most recent ECG reviewed.   Labs: Lab Results  Component Value Date/Time   K 4.2 06/23/2017 09:05 AM   BUN 17 06/23/2017 09:05 AM  CREATININE 1.28 (H) 06/23/2017 09:05 AM   CREATININE 1.18 (H) 10/13/2016 10:04 AM   ALT 19 02/26/2017 08:53 PM   TSH 1.620 06/14/2017 04:44 PM   HGB 14.0 06/14/2017 04:44 PM     Lipids: Lab Results  Component Value Date/Time   LDLCALC 87 11/25/2015 09:55 AM   CHOL 187 11/25/2015 09:55 AM   TRIG 209 (H) 11/25/2015 09:55 AM   HDL 58 11/25/2015 09:55 AM       ASSESSMENT AND PLAN:  1. Persistent atrial fibrillation: Symptomatically stable on metoprolol succinate 50 mg twice daily. Anticoagulated with Pradaxa. No changes.  2. Tachycardia-bradycardia syndrome status post pacemaker placement: Stable with normal device function. Follows with EP.  3. Accelerated hypertension: Mildly elevated. I will monitor.  4. Hyperlipidemia: Continue pravastatin 80 mg.     Disposition: Follow up 6 months.  Kate Sable, M.D., F.A.C.C.

## 2017-07-23 ENCOUNTER — Other Ambulatory Visit: Payer: Self-pay | Admitting: Family Medicine

## 2017-07-26 ENCOUNTER — Other Ambulatory Visit (HOSPITAL_COMMUNITY): Payer: Self-pay | Admitting: Psychiatry

## 2017-08-04 ENCOUNTER — Other Ambulatory Visit: Payer: Self-pay | Admitting: Family Medicine

## 2017-08-06 DIAGNOSIS — M5416 Radiculopathy, lumbar region: Secondary | ICD-10-CM | POA: Diagnosis not present

## 2017-08-06 DIAGNOSIS — I10 Essential (primary) hypertension: Secondary | ICD-10-CM | POA: Diagnosis not present

## 2017-08-06 DIAGNOSIS — Z681 Body mass index (BMI) 19 or less, adult: Secondary | ICD-10-CM | POA: Diagnosis not present

## 2017-08-11 ENCOUNTER — Other Ambulatory Visit (HOSPITAL_COMMUNITY): Payer: Self-pay | Admitting: Psychiatry

## 2017-08-12 ENCOUNTER — Other Ambulatory Visit (HOSPITAL_COMMUNITY): Payer: Self-pay | Admitting: Psychiatry

## 2017-08-13 ENCOUNTER — Other Ambulatory Visit (HOSPITAL_COMMUNITY): Payer: Self-pay | Admitting: Psychiatry

## 2017-08-13 ENCOUNTER — Telehealth (HOSPITAL_COMMUNITY): Payer: Self-pay | Admitting: *Deleted

## 2017-08-13 MED ORDER — DULOXETINE HCL 60 MG PO CPEP: 60.0000 mg | ORAL_CAPSULE | Freq: Two times a day (BID) | ORAL | 0 refills | Status: DC

## 2017-08-13 NOTE — Telephone Encounter (Signed)
Pt called stating she is out of refills for her Cymbalta. Per pt chart, pt medication was last filled on 10-27-2016 which as the last time pt f/u with provider. Per pt chart, medication was given 60 tabs 3 refills. Informed pt that per her chart, she should have ran out of refills back in May 2018 who's been approving the medications. Asked pt if she's been taking her medication since she last f/u with provider. Per pt she thinks Dr. Harrington Challenger has been approving her medications and she's been taking her meds as prescribed and she's out of medication. Informed pt f/u appt has to be made and message will be sent to provider. Per pt, shes not been feeling well since the last time she saw Dr. Harrington Challenger and she's been getting shots in her hips and her son has been taking her to those appt. Offer pt provider next available appt and pt did not want that appt. Pt is schedule to f/u with provider 08-31-2017. Message will be sent to provider.

## 2017-08-13 NOTE — Telephone Encounter (Signed)
30 day supply sent in.

## 2017-08-16 ENCOUNTER — Encounter: Payer: Self-pay | Admitting: Family Medicine

## 2017-08-16 ENCOUNTER — Ambulatory Visit: Payer: PPO | Admitting: Family Medicine

## 2017-08-16 VITALS — BP 126/86 | Ht 70.0 in | Wt 152.0 lb

## 2017-08-16 DIAGNOSIS — D72829 Elevated white blood cell count, unspecified: Secondary | ICD-10-CM

## 2017-08-16 DIAGNOSIS — E038 Other specified hypothyroidism: Secondary | ICD-10-CM

## 2017-08-16 DIAGNOSIS — R634 Abnormal weight loss: Secondary | ICD-10-CM

## 2017-08-16 DIAGNOSIS — N289 Disorder of kidney and ureter, unspecified: Secondary | ICD-10-CM

## 2017-08-16 MED ORDER — HYDROCODONE-ACETAMINOPHEN 10-325 MG PO TABS: ORAL_TABLET | ORAL | 0 refills | Status: DC

## 2017-08-16 NOTE — Progress Notes (Signed)
   Subjective:    Patient ID: Kelsey Sandoval, female    DOB: 1932/10/28, 83 y.o.   MRN: [id number]  HPI Patient here today to follow up on her spinal stenosis/satica pain/Neuropathy in legs.Has seen Dr. Brien Few for pain management and received a shot in the back and has helped some. Has no appetite.Food tastes awful. Patient relates fair amount of back pain and some discomfort into her legs the injection helped some but she has ongoing trouble with pain and discomfort.  Denies any other particular troubles no fever chills sweats.  She does relate some moderate fatigue Review of Systems    Denies sweats chills fevers nausea vomiting diarrhea does relate poor appetite Objective:   Physical Exam Neck no masses lungs are clear no crackles heart regular pulse normal extremities no edema skin warm dry  25 minutes was spent with the patient. Greater than half the time was spent in discussion and answering questions and counseling regarding the issues that the patient came in for today.  I doubt that the patient has underlying cancer going on but if her weight loss continues we will need to do further testing I believe her weight loss is primarily related to poor p.o. intake    Assessment & Plan:  Weight loss-probably related into just not eating enough calories patient inquired about Megace but that is generally only used for severe anorexia related to severe cancer illness  She does have renal insufficiency will recheck metabolic 7 to make sure that is not getting worse  She also had slight leukocytosis on previous lab work we will repeat a CBC may need further looking into if this is going up  With weight loss we will check thyroid function.  She is on medication may need adjusting  Patient was encouraged to eat calorie rich foods to try to help her weight come up  The patient was seen today as part of a comprehensive visit regarding pain control. Patient's compliance with the medication as  well as discussion regarding effectiveness was completed. Prescriptions were written. Patient was advised to follow-up in 3 months. The patient was assessed for any signs of severe side effects. The patient was advised to take the medicine as directed and to report to [place] if any side effect issues.

## 2017-08-17 ENCOUNTER — Telehealth: Payer: Self-pay | Admitting: Cardiology

## 2017-08-17 ENCOUNTER — Ambulatory Visit (INDEPENDENT_AMBULATORY_CARE_PROVIDER_SITE_OTHER): Payer: PPO | Admitting: *Deleted

## 2017-08-17 DIAGNOSIS — I495 Sick sinus syndrome: Secondary | ICD-10-CM | POA: Diagnosis not present

## 2017-08-17 LAB — TSH: TSH: 3.03 u[IU]/mL (ref 0.450–4.500)

## 2017-08-17 LAB — BASIC METABOLIC PANEL
BUN/Creatinine Ratio: 14 (ref 12–28)
BUN: 18 mg/dL (ref 8–27)
CALCIUM: 9.4 mg/dL (ref 8.7–10.3)
CHLORIDE: 100 mmol/L (ref 96–106)
CO2: 22 mmol/L (ref 20–29)
Creatinine, Ser: 1.29 mg/dL — ABNORMAL HIGH (ref 0.57–1.00)
GFR calc non Af Amer: 39 mL/min/{1.73_m2} — ABNORMAL LOW (ref >59)
GFR, EST AFRICAN AMERICAN: 45 mL/min/{1.73_m2} — AB (ref >59)
Glucose: 87 mg/dL (ref 65–99)
POTASSIUM: 5 mmol/L (ref 3.5–5.2)
Sodium: 140 mmol/L (ref 134–144)

## 2017-08-17 LAB — CBC WITH DIFFERENTIAL/PLATELET
BASOS ABS: 0 10*3/uL (ref 0.0–0.2)
Basos: 0 %
EOS (ABSOLUTE): 0 10*3/uL (ref 0.0–0.4)
Eos: 0 %
Hematocrit: 42.4 % (ref 34.0–46.6)
Hemoglobin: 13.7 g/dL (ref 11.1–15.9)
IMMATURE GRANULOCYTES: 0 %
Immature Grans (Abs): 0 10*3/uL (ref 0.0–0.1)
Lymphocytes Absolute: 2.4 10*3/uL (ref 0.7–3.1)
Lymphs: 21 %
MCH: 29.5 pg (ref 26.6–33.0)
MCHC: 32.3 g/dL (ref 31.5–35.7)
MCV: 91 fL (ref 79–97)
MONOS ABS: 0.9 10*3/uL (ref 0.1–0.9)
Monocytes: 8 %
NEUTROS PCT: 71 %
Neutrophils Absolute: 7.7 10*3/uL — ABNORMAL HIGH (ref 1.4–7.0)
PLATELETS: 241 10*3/uL (ref 150–379)
RBC: 4.65 x10E6/uL (ref 3.77–5.28)
RDW: 14.4 % (ref 12.3–15.4)
WBC: 11 10*3/uL — AB (ref 3.4–10.8)

## 2017-08-17 LAB — T4, FREE: Free T4: 1.38 ng/dL (ref 0.82–1.77)

## 2017-08-17 NOTE — Telephone Encounter (Signed)
LMOVM reminding pt to send remote transmission.   

## 2017-08-18 LAB — CUP PACEART REMOTE DEVICE CHECK
Battery Impedance: 322 Ohm
Brady Statistic AP VS Percent: 0 %
Brady Statistic AS VS Percent: 95 %
Implantable Lead Implant Date: 20120810
Implantable Lead Location: 753860
Implantable Lead Model: 5076
Lead Channel Impedance Value: 463 Ohm
Lead Channel Impedance Value: 747 Ohm
Lead Channel Pacing Threshold Amplitude: 0.875 V
Lead Channel Pacing Threshold Pulse Width: 0.4 ms
Lead Channel Setting Pacing Amplitude: 2.5 V
Lead Channel Setting Sensing Sensitivity: 5.6 mV
MDC IDC LEAD IMPLANT DT: 20120810
MDC IDC LEAD LOCATION: 753859
MDC IDC MSMT BATTERY REMAINING LONGEVITY: 102 mo
MDC IDC MSMT BATTERY VOLTAGE: 2.79 V
MDC IDC MSMT LEADCHNL RA PACING THRESHOLD AMPLITUDE: 1 V
MDC IDC MSMT LEADCHNL RV PACING THRESHOLD PULSEWIDTH: 0.4 ms
MDC IDC PG IMPLANT DT: 20120810
MDC IDC SESS DTM: 20181114140147
MDC IDC SET LEADCHNL RA PACING AMPLITUDE: 2 V
MDC IDC SET LEADCHNL RV PACING PULSEWIDTH: 0.4 ms
MDC IDC STAT BRADY AP VP PERCENT: 2 %
MDC IDC STAT BRADY AS VP PERCENT: 2 %

## 2017-08-18 NOTE — Progress Notes (Signed)
Remote pacemaker transmission.   

## 2017-08-19 MED ORDER — LISINOPRIL 10 MG PO TABS: 10.0000 mg | ORAL_TABLET | Freq: Every day | ORAL | 1 refills | Status: DC

## 2017-08-19 NOTE — Addendum Note (Signed)
Addended by: Ofilia Neas R on: 08/19/2017 11:20 AM   Modules accepted: Orders

## 2017-08-20 ENCOUNTER — Encounter: Payer: Self-pay | Admitting: Cardiology

## 2017-08-31 ENCOUNTER — Encounter (HOSPITAL_COMMUNITY): Payer: Self-pay | Admitting: Psychiatry

## 2017-08-31 ENCOUNTER — Ambulatory Visit (INDEPENDENT_AMBULATORY_CARE_PROVIDER_SITE_OTHER): Payer: PPO | Admitting: Psychiatry

## 2017-08-31 VITALS — BP 134/82 | HR 83 | Ht 70.0 in | Wt 156.0 lb

## 2017-08-31 DIAGNOSIS — M549 Dorsalgia, unspecified: Secondary | ICD-10-CM

## 2017-08-31 DIAGNOSIS — M2550 Pain in unspecified joint: Secondary | ICD-10-CM

## 2017-08-31 DIAGNOSIS — F419 Anxiety disorder, unspecified: Secondary | ICD-10-CM | POA: Diagnosis not present

## 2017-08-31 DIAGNOSIS — R450 Nervousness: Secondary | ICD-10-CM

## 2017-08-31 DIAGNOSIS — Z818 Family history of other mental and behavioral disorders: Secondary | ICD-10-CM

## 2017-08-31 DIAGNOSIS — R002 Palpitations: Secondary | ICD-10-CM | POA: Diagnosis not present

## 2017-08-31 DIAGNOSIS — F322 Major depressive disorder, single episode, severe without psychotic features: Secondary | ICD-10-CM | POA: Diagnosis not present

## 2017-08-31 MED ORDER — BUSPIRONE HCL 10 MG PO TABS: 10.0000 mg | ORAL_TABLET | Freq: Three times a day (TID) | ORAL | 3 refills | Status: DC

## 2017-08-31 MED ORDER — DULOXETINE HCL 60 MG PO CPEP: 60.0000 mg | ORAL_CAPSULE | Freq: Two times a day (BID) | ORAL | 2 refills | Status: DC
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
## 2017-09-06 ENCOUNTER — Other Ambulatory Visit: Payer: Self-pay | Admitting: Family Medicine

## 2017-09-06 DIAGNOSIS — N289 Disorder of kidney and ureter, unspecified: Secondary | ICD-10-CM | POA: Diagnosis not present

## 2017-09-06 DIAGNOSIS — D72829 Elevated white blood cell count, unspecified: Secondary | ICD-10-CM | POA: Diagnosis not present

## 2017-09-07 LAB — CBC WITH DIFFERENTIAL/PLATELET
BASOS: 0 %
Basophils Absolute: 0 10*3/uL (ref 0.0–0.2)
EOS (ABSOLUTE): 0.1 10*3/uL (ref 0.0–0.4)
Eos: 1 %
Hematocrit: 39.8 % (ref 34.0–46.6)
Hemoglobin: 13.1 g/dL (ref 11.1–15.9)
IMMATURE GRANS (ABS): 0 10*3/uL (ref 0.0–0.1)
IMMATURE GRANULOCYTES: 0 %
LYMPHS: 28 %
Lymphocytes Absolute: 2 10*3/uL (ref 0.7–3.1)
MCH: 29.4 pg (ref 26.6–33.0)
MCHC: 32.9 g/dL (ref 31.5–35.7)
MCV: 89 fL (ref 79–97)
MONOS ABS: 0.5 10*3/uL (ref 0.1–0.9)
Monocytes: 8 %
NEUTROS PCT: 63 %
Neutrophils Absolute: 4.5 10*3/uL (ref 1.4–7.0)
PLATELETS: 242 10*3/uL (ref 150–379)
RBC: 4.46 x10E6/uL (ref 3.77–5.28)
RDW: 15.2 % (ref 12.3–15.4)
WBC: 7.1 10*3/uL (ref 3.4–10.8)

## 2017-09-07 LAB — BASIC METABOLIC PANEL
BUN/Creatinine Ratio: 17 (ref 12–28)
BUN: 19 mg/dL (ref 8–27)
CALCIUM: 9.2 mg/dL (ref 8.7–10.3)
CO2: 24 mmol/L (ref 20–29)
CREATININE: 1.11 mg/dL — AB (ref 0.57–1.00)
Chloride: 103 mmol/L (ref 96–106)
GFR calc Af Amer: 53 mL/min/{1.73_m2} — ABNORMAL LOW (ref >59)
GFR, EST NON AFRICAN AMERICAN: 46 mL/min/{1.73_m2} — AB (ref >59)
GLUCOSE: 130 mg/dL — AB (ref 65–99)
POTASSIUM: 4.6 mmol/L (ref 3.5–5.2)
SODIUM: 143 mmol/L (ref 134–144)

## 2017-09-10 ENCOUNTER — Telehealth: Payer: Self-pay | Admitting: Family Medicine

## 2017-09-10 NOTE — Telephone Encounter (Signed)
Please assist this patient call her pharmacy let the pharmacy know that it is okay for her to get her prescription filled today instead of Sunday because of upcoming weather event-if they will not allow for that please asked them to fill it on Saturday

## 2017-09-10 NOTE — Telephone Encounter (Signed)
Patient notified

## 2017-09-10 NOTE — Telephone Encounter (Signed)
Patient has an Rx for her pain medication that is due to be filled this coming up Sunday.  She wants to know if this can be filled today or tomorrow due to the weather?

## 2017-09-10 NOTE — Telephone Encounter (Signed)
Pharmacy notified that Dr Nicki Reaper states it is ok to fill the prescription today due to impending weather system

## 2017-09-15 ENCOUNTER — Ambulatory Visit: Payer: PPO | Admitting: Family Medicine

## 2017-09-20 ENCOUNTER — Encounter: Payer: Self-pay | Admitting: Family Medicine

## 2017-09-20 ENCOUNTER — Ambulatory Visit: Payer: PPO | Admitting: Family Medicine

## 2017-09-20 VITALS — BP 128/80 | Ht 70.0 in | Wt 159.1 lb

## 2017-09-20 DIAGNOSIS — I10 Essential (primary) hypertension: Secondary | ICD-10-CM

## 2017-09-20 DIAGNOSIS — N289 Disorder of kidney and ureter, unspecified: Secondary | ICD-10-CM

## 2017-09-20 NOTE — Progress Notes (Signed)
   Subjective:    Patient ID: Kelsey Sandoval, female    DOB: 03-29-1933, 83 y.o.   MRN: [id number]  HPI Patient is here today to go over her lab work. She states she has protein in urine and you were going to discuss this with her. We reviewed over her lab work I also reviewed over her medicine list She relates a lot of fatigue and tiredness denies any swelling to legs She does get out of energy quickly but denies shortness of breath chest pressure pain or discomfort  Review of Systems  Constitutional: Negative for activity change, fatigue and fever.  HENT: Negative for congestion.   Respiratory: Negative for cough, chest tightness and shortness of breath.   Cardiovascular: Negative for chest pain and leg swelling.  Gastrointestinal: Negative for abdominal pain.  Skin: Negative for color change.  Neurological: Negative for headaches.  Psychiatric/Behavioral: Negative for behavioral problems.       Objective:   Physical Exam  Constitutional: She appears well-developed and well-nourished. No distress.  HENT:  Head: Normocephalic and atraumatic.  Eyes: Right eye exhibits no discharge. Left eye exhibits no discharge.  Neck: No tracheal deviation present.  Cardiovascular: Normal rate, regular rhythm and normal heart sounds.  No murmur heard. Pulmonary/Chest: Effort normal and breath sounds normal. No respiratory distress. She has no wheezes. She has no rales.  Musculoskeletal: She exhibits no edema.  Lymphadenopathy:    She has no cervical adenopathy.  Neurological: She is alert. She exhibits normal muscle tone.  Skin: Skin is warm and dry. No erythema.  Psychiatric: Her behavior is normal.  Vitals reviewed.         Assessment & Plan:  Blood pressure good control continue current measures  Renal insufficiency stable.  No sign of any fluid overload.  Recheck lab work again in the spring  Chronic pain already has prescriptions will follow-up at her usual visit  She states  she will be seeing Dr. Brien Few for injections in the near future  Follow-up if any troubles

## 2017-10-06 DIAGNOSIS — M461 Sacroiliitis, not elsewhere classified: Secondary | ICD-10-CM | POA: Diagnosis not present

## 2017-10-06 DIAGNOSIS — I10 Essential (primary) hypertension: Secondary | ICD-10-CM | POA: Diagnosis not present

## 2017-10-13 ENCOUNTER — Other Ambulatory Visit: Payer: Self-pay

## 2017-10-13 MED ORDER — HYDROCODONE-ACETAMINOPHEN 10-325 MG PO TABS: ORAL_TABLET | ORAL | 0 refills | Status: DC

## 2017-11-02 ENCOUNTER — Telehealth: Payer: Self-pay | Admitting: Family Medicine

## 2017-11-09 ENCOUNTER — Ambulatory Visit: Payer: PPO | Admitting: Orthopaedic Surgery

## 2017-11-10 ENCOUNTER — Ambulatory Visit: Payer: PPO | Admitting: Orthopaedic Surgery

## 2017-11-10 ENCOUNTER — Encounter: Payer: Self-pay | Admitting: Orthopaedic Surgery

## 2017-11-10 VITALS — Ht 70.0 in

## 2017-11-10 DIAGNOSIS — M25561 Pain in right knee: Secondary | ICD-10-CM

## 2017-11-10 DIAGNOSIS — I340 Nonrheumatic mitral (valve) insufficiency: Secondary | ICD-10-CM

## 2017-11-10 DIAGNOSIS — M25562 Pain in left knee: Secondary | ICD-10-CM

## 2017-11-10 DIAGNOSIS — G894 Chronic pain syndrome: Secondary | ICD-10-CM

## 2017-11-10 DIAGNOSIS — G8929 Other chronic pain: Secondary | ICD-10-CM | POA: Diagnosis not present

## 2017-11-10 DIAGNOSIS — Z950 Presence of cardiac pacemaker: Secondary | ICD-10-CM

## 2017-11-10 NOTE — Progress Notes (Signed)
CC: Both of my knees are hurting. I would like an injection in both knees.  The patient has had chronic pain and tenderness of both knees for some time.  Injections help.  There is no locking or giving way of the knee.  There is no new trauma. There is no redness or signs of infections.  The knees have a mild effusion and some crepitus.  There is no redness or signs of recent trauma.  Right knee ROM is 0-100 and left knee ROM is 0-95.  Impression:  Chronic pain of the both knees  Return:  PRN  PROCEDURE NOTE:  The patient requests injections of both knees, verbal consent was obtained.  The left and right knee were individually prepped appropriately after time out was performed.   Sterile technique was observed and injection of 1 cc of Depo-Medrol 40 mg with several cc's of plain xylocaine. Anesthesia was provided by ethyl chloride and a 20-gauge needle was used to inject each knee area. The injections were tolerated well.  A band aid dressing was applied.  The patient was advised to apply ice later today and tomorrow to the injection sight as needed.  Call if any problem.  Precautions discussed.      Electronically Signed Sanjuana Kava, MD 2/6/20191:19 PM

## 2017-11-16 ENCOUNTER — Telehealth: Payer: Self-pay | Admitting: Cardiology

## 2017-11-16 ENCOUNTER — Ambulatory Visit (INDEPENDENT_AMBULATORY_CARE_PROVIDER_SITE_OTHER): Payer: PPO | Admitting: *Deleted

## 2017-11-16 DIAGNOSIS — I495 Sick sinus syndrome: Secondary | ICD-10-CM | POA: Diagnosis not present

## 2017-11-16 NOTE — Telephone Encounter (Signed)
Spoke with pt and reminded pt of remote transmission that is due today. Pt verbalized understanding.   

## 2017-11-17 ENCOUNTER — Encounter: Payer: Self-pay | Admitting: Cardiology

## 2017-11-17 ENCOUNTER — Ambulatory Visit: Payer: PPO | Admitting: Family Medicine

## 2017-11-17 NOTE — Progress Notes (Signed)
Remote pacemaker transmission.   

## 2017-11-18 ENCOUNTER — Ambulatory Visit (INDEPENDENT_AMBULATORY_CARE_PROVIDER_SITE_OTHER): Payer: PPO | Admitting: Family Medicine

## 2017-11-18 ENCOUNTER — Encounter: Payer: Self-pay | Admitting: Family Medicine

## 2017-11-18 VITALS — BP 140/86 | Ht 70.0 in | Wt 154.0 lb

## 2017-11-18 DIAGNOSIS — E038 Other specified hypothyroidism: Secondary | ICD-10-CM | POA: Diagnosis not present

## 2017-11-18 DIAGNOSIS — I10 Essential (primary) hypertension: Secondary | ICD-10-CM | POA: Diagnosis not present

## 2017-11-18 DIAGNOSIS — E785 Hyperlipidemia, unspecified: Secondary | ICD-10-CM | POA: Diagnosis not present

## 2017-11-18 DIAGNOSIS — Z79891 Long term (current) use of opiate analgesic: Secondary | ICD-10-CM

## 2017-11-18 MED ORDER — HYDROCODONE-ACETAMINOPHEN 10-325 MG PO TABS: ORAL_TABLET | ORAL | 0 refills | Status: DC

## 2017-11-18 MED ORDER — ALPRAZOLAM 0.5 MG PO TABS: ORAL_TABLET | ORAL | 3 refills | Status: DC

## 2017-11-18 MED ORDER — AMLODIPINE BESYLATE 2.5 MG PO TABS: 2.5000 mg | ORAL_TABLET | Freq: Every day | ORAL | 1 refills | Status: DC

## 2017-11-18 NOTE — Progress Notes (Signed)
Subjective:    Patient ID: Kelsey Sandoval, female    DOB: October 13, 1932, 84 y.o.   MRN: [id number]  HPI  This patient was seen today for chronic pain  The medication list was reviewed and updated.   -Compliance with medication: Yes  - Number patient states they take daily: 4-5 daily  -when was the last dose patient took? 1;45pm today  The patient was advised the importance of maintaining medication and not using illegal substances with these.  Here for refills and follow up  The patient was educated that we can provide 3 monthly scripts for their medication, it is their responsibility to follow the instructions.  Side effects or complications from medications: none  Patient is aware that pain medications are meant to minimize the severity of the pain to allow their pain levels to improve to allow for better function. They are aware of that pain medications cannot totally remove their pain.   Due for UDT ( at least once per year) : today 11/18/2017  Patient's weight is somewhat down compared to where it was patient states she is trying to eat well but she does not eat a large amount she denies chest pressure she denies early satiety she denies nausea vomiting diarrhea denies rectal bleeding denies cough denies wheezing denies night sweats fever or chills  She suffers with insomnia she uses Xanax for this she states it does not cause her to feel drowsy in the morning I have reduced her dose down and previously she understands.  Patient for blood pressure check up. Patient relates compliance with meds. Todays BP reviewed with the patient. Patient denies issues with medication. Patient relates reasonable diet. Patient tries to minimize salt. Patient aware of BP goals.  Patient here for follow-up regarding cholesterol.  Patient does try to maintain a reasonable diet.  Patient does take the medication on a regular basis.  Denies missing a dose.  The patient denies any obvious side effects.   Prior blood work results reviewed with the patient.  The patient is aware of his cholesterol goals and the need to keep it under good control to lessen the risk of disease.  Patient also has reflux issues and if she does not take her acid blocker she suffers therefore she takes it on a regular basis     Review of Systems  Constitutional: Negative for activity change and appetite change.  HENT: Negative for congestion.   Respiratory: Negative for cough.   Cardiovascular: Negative for chest pain.  Gastrointestinal: Negative for abdominal pain and vomiting.  Skin: Negative for color change.  Neurological: Negative for weakness.  Psychiatric/Behavioral: Negative for confusion.       Objective:   Physical Exam  Constitutional: She appears well-nourished. No distress.  HENT:  Head: Normocephalic.  Right Ear: External ear normal.  Left Ear: External ear normal.  Eyes: Right eye exhibits no discharge. Left eye exhibits no discharge.  Neck: No tracheal deviation present.  Cardiovascular: Normal rate, regular rhythm and normal heart sounds.  No murmur heard. Pulmonary/Chest: Effort normal and breath sounds normal. No respiratory distress. She has no wheezes. She has no rales.  Musculoskeletal: She exhibits no edema.  Lymphadenopathy:    She has no cervical adenopathy.  Neurological: She is alert.  Psychiatric: Her behavior is normal.  Vitals reviewed.      25 minutes was spent with the patient.  This statement verifies that 25 minutes was indeed spent with the patient. Greater than half the time  was spent in discussion, counseling and answering questions  regarding the issues that the patient came in for today as reflected in the diagnosis (s) please refer to documentation for further details. Significant time spent with patient regarding nutritional history pain medication insomnia and reviewing medications lipid thyroid etc.    Assessment & Plan:  The patient was seen today as  part of a comprehensive visit regarding pain control. Patient's compliance with the medication as well as discussion regarding effectiveness was completed. Prescriptions were written. Patient was advised to follow-up in 3 months. The patient was assessed for any signs of severe side effects. The patient was advised to take the medicine as directed and to report to [place] if any side effect issues. Patient compliant with medicine drug registry checked 3 prescriptions given  Insomnia severe needs Xanax at nighttime does not take it with her pain medicine denies it causing excessive drowsiness  Orthostatic hypotension reduce amlodipine 2.5 mg recommended  Mild weight loss I believe this is nutritional I recommend the patient increase the amount she is taking in on her exam today I do not find any evidence of cancer or underlying infection we will monitor this may need to go through more testing  Thyroid previous lab work in November look good continue current medicine  Heart disease stable

## 2017-11-24 LAB — CUP PACEART REMOTE DEVICE CHECK
Battery Remaining Longevity: 97 mo
Brady Statistic AS VP Percent: 2 %
Brady Statistic AS VS Percent: 95 %
Date Time Interrogation Session: 20190212214715
Implantable Lead Implant Date: 20120810
Implantable Lead Location: 753859
Implantable Lead Model: 5076
Implantable Pulse Generator Implant Date: 20120810
Lead Channel Pacing Threshold Amplitude: 0.875 V
Lead Channel Pacing Threshold Amplitude: 1 V
Lead Channel Pacing Threshold Pulse Width: 0.4 ms
Lead Channel Setting Pacing Amplitude: 2 V
Lead Channel Setting Pacing Pulse Width: 0.4 ms
Lead Channel Setting Sensing Sensitivity: 5.6 mV
MDC IDC LEAD IMPLANT DT: 20120810
MDC IDC LEAD LOCATION: 753860
MDC IDC MSMT BATTERY IMPEDANCE: 370 Ohm
MDC IDC MSMT BATTERY VOLTAGE: 2.79 V
MDC IDC MSMT LEADCHNL RA IMPEDANCE VALUE: 469 Ohm
MDC IDC MSMT LEADCHNL RA PACING THRESHOLD PULSEWIDTH: 0.4 ms
MDC IDC MSMT LEADCHNL RV IMPEDANCE VALUE: 765 Ohm
MDC IDC SET LEADCHNL RV PACING AMPLITUDE: 2.5 V
MDC IDC STAT BRADY AP VP PERCENT: 2 %
MDC IDC STAT BRADY AP VS PERCENT: 0 %

## 2017-11-24 LAB — TOXASSURE SELECT 13 (MW), URINE

## 2017-11-24 LAB — SPECIMEN STATUS REPORT

## 2017-12-01 ENCOUNTER — Ambulatory Visit (HOSPITAL_COMMUNITY): Payer: PPO | Admitting: Psychiatry

## 2017-12-14 ENCOUNTER — Ambulatory Visit (INDEPENDENT_AMBULATORY_CARE_PROVIDER_SITE_OTHER): Payer: PPO | Admitting: Psychiatry

## 2017-12-14 ENCOUNTER — Encounter (HOSPITAL_COMMUNITY): Payer: Self-pay | Admitting: Psychiatry

## 2017-12-14 VITALS — BP 157/79 | HR 99 | Ht 70.0 in | Wt 155.0 lb

## 2017-12-14 DIAGNOSIS — M2550 Pain in unspecified joint: Secondary | ICD-10-CM

## 2017-12-14 DIAGNOSIS — M549 Dorsalgia, unspecified: Secondary | ICD-10-CM

## 2017-12-14 DIAGNOSIS — M542 Cervicalgia: Secondary | ICD-10-CM | POA: Diagnosis not present

## 2017-12-14 DIAGNOSIS — F322 Major depressive disorder, single episode, severe without psychotic features: Secondary | ICD-10-CM | POA: Diagnosis not present

## 2017-12-14 DIAGNOSIS — Z818 Family history of other mental and behavioral disorders: Secondary | ICD-10-CM | POA: Diagnosis not present

## 2017-12-14 MED ORDER — DULOXETINE HCL 60 MG PO CPEP: 60.0000 mg | ORAL_CAPSULE | Freq: Two times a day (BID) | ORAL | 3 refills | Status: DC

## 2017-12-14 MED ORDER — MIRTAZAPINE 15 MG PO TABS: 15.0000 mg | ORAL_TABLET | Freq: Every day | ORAL | 3 refills | Status: DC

## 2017-12-14 MED ORDER — ALPRAZOLAM 0.5 MG PO TABS: 0.5000 mg | ORAL_TABLET | Freq: Every day | ORAL | 3 refills | Status: DC | PRN

## 2017-12-14 MED ORDER — BUSPIRONE HCL 10 MG PO TABS: 10.0000 mg | ORAL_TABLET | Freq: Three times a day (TID) | ORAL | 3 refills | Status: DC

## 2017-12-14 NOTE — Progress Notes (Signed)
[facility] MD/PA/NP OP Progress Note  12/14/2017 3:16 PM Kelsey SCHMELZER  MRN:  [id number]  Chief Complaint:  Chief Complaint    Depression; Anxiety; Follow-up     HPI: This patient is an 84 year old divorced white female who lives alone in [place].  She has 2 children and several grandchildren and great-grandchildren.  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  The patient returns for follow-up regarding depression and anxiety.  She was last seen 3 months ago.  The patient is doing fairly well.  She has a lot of fixed ideas about what she likes and does not like to do.  She does not like to exercise.  She does not like to cook and consequently is not eating the best.  Her family brings in meals for her.  She is tried the senior center with her sister but does not really like being around that many people but she states that she will try it again.  Her mood is been fairly stable but she wakes up anxious and takes half of her 0.5 mg Xanax and it seems to alleviate it.  She still has a lot of chronic pain but tries to get around the best she can and she still drives short distances.  She is dating a man who lives near her who also has a lot of fixed ideas.  They seem to be getting along fairly well.  Her main concern right now is that her appetite is diminished and she is not sleeping well.  She often has upsetting dreams about her boyfriend who died.  I suggested we add some mirtazapine at bedtime to help both sleep appetite and mood. Visit Diagnosis:    ICD-10-CM   1. Severe single current episode of major depressive disorder, without psychotic features ([place]) F32.2     Past Psychiatric History: none  Past Medical History:  Past Medical History:  Diagnosis Date  . Anxiety   . Arthritis   . Back pain, chronic    Sciatica  . Cataracts, bilateral   . Depression   . Essential hypertension   . Hyperlipidemia   . Membranous nephropathy determined by biopsy  06/07/2015  . Nephrotic syndrome    RHUX  . Neuropathy   . Osteopenia   . Pacemaker    [employer]  . PAF (paroxysmal atrial fibrillation) ([place])   . Pre-diabetes   . Tachycardia-bradycardia syndrome [facility])     Past Surgical History:  Procedure Laterality Date  . ABDOMINAL HYSTERECTOMY     partial-pt has no ovaries  . APPENDECTOMY    . BACK SURGERY    . BIOPSY N/A 01/17/2014   Procedure: BIOPSY;  Surgeon: Rogene Houston, MD;  Location: [facility];  Service: Endoscopy;  Laterality: N/A;  . CATARACT EXTRACTION W/PHACO Left 02/28/2015   Procedure: CATARACT EXTRACTION PHACO AND INTRAOCULAR LENS PLACEMENT (IOC);  Surgeon: [name], MD;  Location: [facility] ORS;  Service: Ophthalmology;  Laterality: Left;  CDE 18.71  . COLONOSCOPY  9/05  . COLONOSCOPY N/A 10/11/2013   Procedure: COLONOSCOPY;  Surgeon: Rogene Houston, MD;  Location: [facility];  Service: Endoscopy;  Laterality: N/A;  1200  . ESOPHAGOGASTRODUODENOSCOPY N/A 01/17/2014   Procedure: ESOPHAGOGASTRODUODENOSCOPY (EGD);  Surgeon: Rogene Houston, MD;  Location: [facility];  Service: Endoscopy;  Laterality: N/A;  230  . ESOPHAGOGASTRODUODENOSCOPY N/A 01/02/2016   Procedure: ESOPHAGOGASTRODUODENOSCOPY (EGD);  Surgeon: Rogene Houston, MD;  Location: [facility];  Service:  Endoscopy;  Laterality: N/A;  240  . INSERT / REPLACE / REMOVE PACEMAKER     2012  . Wisdom tooth extracton      Family Psychiatric History: See below  Family History:  Family History  Problem Relation Age of Onset  . Colon cancer Brother   . Anxiety disorder Sister   . Depression Sister   . Anxiety disorder Sister   . Depression Sister     Social History:  Social History   Socioeconomic History  . Marital status: Divorced    Spouse name: None  . Number of children: None  . Years of education: None  . Highest education level: None  Social Needs  . Financial resource strain: None  . Food insecurity - worry: None  . Food insecurity - inability:  None  . Transportation needs - medical: None  . Transportation needs - non-medical: None  Occupational History  . Occupation: retired  Tobacco Use  . Smoking status: Never Smoker  . Smokeless tobacco: Never Used  Substance and Sexual Activity  . Alcohol use: No    Alcohol/week: 0.0 oz  . Drug use: No  . Sexual activity: None  Other Topics Concern  . None  Social History Narrative  . None    Allergies:  Allergies  Allergen Reactions  . Penicillins Other (See Comments)    Caused patient to pass out.Can take cephalosporins Has patient had a PCN reaction causing immediate rash, facial/tongue/throat swelling, SOB or lightheadedness with hypotension: no Has patient had a PCN reaction causing severe rash involving mucus membranes or skin necrosis: No Has patient had a PCN reaction that required hospitalization No Has patient had a PCN reaction occurring within the last 10 years: No If all of the above answers are "NO", then may proceed with Cephalosporin use.   . Levaquin [Levofloxacin] Nausea Only  . Sulfa Antibiotics Other (See Comments)    Unknown  . Zithromax [Azithromycin] Nausea Only and Rash    Metabolic Disorder Labs: No results found for: HGBA1C, MPG No results found for: PROLACTIN Lab Results  Component Value Date   CHOL 187 11/25/2015   TRIG 209 (H) 11/25/2015   HDL 58 11/25/2015   CHOLHDL 3.2 11/25/2015   VLDL 47 (H) 10/08/2014   LDLCALC 87 11/25/2015   LDLCALC 58 10/08/2014   Lab Results  Component Value Date   TSH 3.030 08/16/2017   TSH 1.620 06/14/2017    Therapeutic Level Labs: No results found for: LITHIUM No results found for: VALPROATE No components found for:  CBMZ  Current Medications: Current Outpatient Medications  Medication Sig Dispense Refill  . acetaminophen (TYLENOL) 650 MG CR tablet Take 1,300 mg by mouth every 8 (eight) hours as needed for pain.    Marland Kitchen albuterol (PROVENTIL HFA;VENTOLIN HFA) 108 (90 BASE) MCG/ACT inhaler Inhale 2  puffs into the lungs every 6 (six) hours as needed for wheezing or shortness of breath. 18 g 5  . ALPRAZolam (XANAX) 0.5 MG tablet Take 1 tablet (0.5 mg total) by mouth daily as needed for anxiety. Take one qhs 30 tablet 3  . amLODipine (NORVASC) 2.5 MG tablet Take 1 tablet (2.5 mg total) by mouth daily. 90 tablet 1  . busPIRone (BUSPAR) 10 MG tablet Take 1 tablet (10 mg total) by mouth 3 (three) times daily. 90 tablet 3  . dabigatran (PRADAXA) 150 MG CAPS capsule Take 1 capsule (150 mg total) by mouth 2 (two) times daily. 60 capsule 11  . DULoxetine (CYMBALTA) 60 MG capsule  Take 1 capsule (60 mg total) by mouth 2 (two) times daily. 60 capsule 3  . gabapentin (NEURONTIN) 300 MG capsule 1 in the am, 1 in the afternoon, and 2 in the evening 120 capsule 5  . hydrALAZINE (APRESOLINE) 25 MG tablet TAKE 1 TABLET BY MOUTH TWICE DAILY 60 tablet 5  . HYDROcodone-acetaminophen (NORCO) 10-325 MG tablet Take one tablet every 4 hours prn pain. Max 6 tablets per day 180 tablet 0  . hydrOXYzine (ATARAX/VISTARIL) 10 MG tablet One bid prn nausea, caution drowsiness (Patient taking differently: Take 10 mg by mouth 2 (two) times daily as needed for nausea. ) 40 tablet 2  . levothyroxine (SYNTHROID, LEVOTHROID) 50 MCG tablet TAKE 1 TABLET BY MOUTH ONCE DAILY (CHANGE  IN  DOSE) 90 tablet 1  . lisinopril (PRINIVIL,ZESTRIL) 10 MG tablet Take 1 tablet (10 mg total) daily by mouth. 90 tablet 1  . metoprolol succinate (TOPROL XL) 50 MG 24 hr tablet Take 1 tablet (50 mg total) by mouth 2 (two) times daily. Take with or immediately following a meal. 180 tablet 3  . ondansetron (ZOFRAN-ODT) 8 MG disintegrating tablet Take 1 tablet (8 mg total) by mouth every 8 (eight) hours as needed for nausea or vomiting. 30 tablet 5  . pantoprazole (PROTONIX) 40 MG tablet TAKE ONE TABLET BY MOUTH ONCE DAILY 30 tablet 11  . pravastatin (PRAVACHOL) 80 MG tablet Take 1 tablet (80 mg total) by mouth daily. 90 tablet 1  . vitamin B-12  (CYANOCOBALAMIN) 1000 MCG tablet Take 1 tablet (1,000 mcg total) by mouth daily.    . mirtazapine (REMERON) 15 MG tablet Take 1 tablet (15 mg total) by mouth at bedtime. 30 tablet 3   No current facility-administered medications for this visit.      Musculoskeletal: Strength & Muscle Tone: Decreased Gait & Station: unsteady Patient leans: N/A  Psychiatric Specialty Exam: Review of Systems  Constitutional: Positive for weight loss.  Musculoskeletal: Positive for back pain, joint pain and neck pain.  All other systems reviewed and are negative.   Blood pressure (!) 157/79, pulse 99, height 5\' 10"  (1.778 m), weight 155 lb (70.3 kg), SpO2 100 %.Body mass index is 22.24 kg/m.  General Appearance: Casual, Neat and Well Groomed  Eye Contact:  Good  Speech:  Clear and Coherent  Volume:  Normal  Mood:  Anxious  Affect:  Congruent  Thought Process:  Goal Directed  Orientation:  Full (Time, Place, and Person)  Thought Content: WDL   Suicidal Thoughts:  No  Homicidal Thoughts:  No  Memory:  Immediate;   Good Recent;   Good Remote;   Fair  Judgement:  Fair  Insight:  Fair  Psychomotor Activity:  Decreased  Concentration:  Concentration: Good and Attention Span: Good  Recall:  Good  Fund of Knowledge: Good  Language: Good  Akathisia:  No  Handed:  Right  AIMS (if indicated): not done  Assets:  Communication Skills Desire for Improvement Resilience Social Support Talents/Skills  ADL's:  Intact  Cognition: WNL  Sleep:  Fair   Screenings: PHQ2-9     Office Visit from 11/18/2017 in [place] Office Visit from 09/20/2017 in [place] Office Visit from 01/23/2016 in [facility] Office Visit from 07/19/2015 in [facility]  PHQ-2 Total Score  0  2  0  0       Assessment and Plan: Patient is an 84 year old female with a history of depression and anxiety.  Recently she has been  having more bad dreams  and difficulty sleeping.  For this reason we will add mirtazapine 15 mg at bedtime to her regimen.  She has not really use the trazodone.  She will continue Cymbalta 60 mg twice a day for depression, BuSpar 10 mg 3 times a day for anxiety and Xanax 0.5 mg daily as needed for anxiety as well.  She will return to see me in 3 months   Levonne Spiller, MD 12/14/2017, 3:16 PM

## 2017-12-15 DIAGNOSIS — H25813 Combined forms of age-related cataract, bilateral: Secondary | ICD-10-CM | POA: Diagnosis not present

## 2017-12-15 DIAGNOSIS — H43813 Vitreous degeneration, bilateral: Secondary | ICD-10-CM | POA: Diagnosis not present

## 2017-12-24 ENCOUNTER — Telehealth: Payer: Self-pay

## 2017-12-24 NOTE — Telephone Encounter (Signed)
   [facility] Pre-operative Risk Assessment    Request for surgical clearance:  1. What type of surgery is being performed? Epidural Spinal Injection   2. When is this surgery scheduled? December 29, 2017   3. What type of clearance is required (medical clearance vs. Pharmacy clearance to hold med vs. Both)? Pharmacy clearance to hold med  4. Are there any medications that need to be held prior to surgery and how long? Pradaxa 4-5 days   5. Practice name and name of physician performing surgery? [facility] - Dr. Brien Few   6. What is your office phone and fax number? Phone: [phone], Fax: [phone]   7. Anesthesia type (None, local, MAC, general) ? None listed   Jacinta Shoe 12/24/2017, 2:15 PM  _________________________________________________________________   (provider comments below)

## 2017-12-27 NOTE — Telephone Encounter (Signed)
Routed to pharmacy pool to comment on holding pradaxa.

## 2017-12-27 NOTE — Telephone Encounter (Signed)
Patient with diagnosis of Afib on Pradaxa for anticoagulation.    Procedure: ESI Date of procedure: 12/29/17  CHADS2-VASc score of  4 (CHF, HTN, AGE, DM2, stroke/tia x 2, CAD, AGE, female)  CrCl 62ml/min  Per office protocol, patient can hold Pradaxa for 3 days prior to procedure, especially given impaired crcl.  We generally recommend hold for 3 days with high risk bleeding procedure and NOAC therapy. With procedure on 12/29/17 pt will not be able to hold Pradaxa unless has already been holding without clearance.

## 2017-12-29 DIAGNOSIS — M5416 Radiculopathy, lumbar region: Secondary | ICD-10-CM | POA: Diagnosis not present

## 2017-12-29 DIAGNOSIS — M7061 Trochanteric bursitis, right hip: Secondary | ICD-10-CM | POA: Diagnosis not present

## 2017-12-29 DIAGNOSIS — M5126 Other intervertebral disc displacement, lumbar region: Secondary | ICD-10-CM | POA: Diagnosis not present

## 2017-12-29 DIAGNOSIS — M461 Sacroiliitis, not elsewhere classified: Secondary | ICD-10-CM | POA: Diagnosis not present

## 2018-01-07 ENCOUNTER — Other Ambulatory Visit: Payer: Self-pay | Admitting: Family Medicine

## 2018-01-10 ENCOUNTER — Telehealth: Payer: Self-pay | Admitting: Family Medicine

## 2018-01-10 NOTE — Telephone Encounter (Signed)
Last seen 11/18/17

## 2018-01-10 NOTE — Telephone Encounter (Signed)
Prescription sent electronically to pharmacy. Patient notified. 

## 2018-01-10 NOTE — Telephone Encounter (Signed)
Called checking on refill request that was sent on Friday from [facility].  For Gabapentin

## 2018-01-10 NOTE — Telephone Encounter (Signed)
May have 6 refills on gabapentin

## 2018-01-21 ENCOUNTER — Other Ambulatory Visit: Payer: Self-pay | Admitting: Family Medicine

## 2018-01-24 ENCOUNTER — Other Ambulatory Visit: Payer: Self-pay | Admitting: Family Medicine

## 2018-01-27 DIAGNOSIS — H25811 Combined forms of age-related cataract, right eye: Secondary | ICD-10-CM | POA: Diagnosis not present

## 2018-01-27 DIAGNOSIS — H26492 Other secondary cataract, left eye: Secondary | ICD-10-CM | POA: Diagnosis not present

## 2018-02-01 ENCOUNTER — Other Ambulatory Visit: Payer: Self-pay | Admitting: Family Medicine

## 2018-02-15 ENCOUNTER — Telehealth: Payer: Self-pay | Admitting: Cardiology

## 2018-02-15 ENCOUNTER — Ambulatory Visit (INDEPENDENT_AMBULATORY_CARE_PROVIDER_SITE_OTHER): Payer: PPO | Admitting: *Deleted

## 2018-02-15 DIAGNOSIS — I495 Sick sinus syndrome: Secondary | ICD-10-CM | POA: Diagnosis not present

## 2018-02-15 NOTE — Telephone Encounter (Signed)
Spoke with pt and reminded pt of remote transmission that is due today. Pt verbalized understanding.   

## 2018-02-16 ENCOUNTER — Ambulatory Visit (INDEPENDENT_AMBULATORY_CARE_PROVIDER_SITE_OTHER): Payer: PPO | Admitting: Family Medicine

## 2018-02-16 ENCOUNTER — Ambulatory Visit (HOSPITAL_COMMUNITY)
Admission: RE | Admit: 2018-02-16 | Discharge: 2018-02-16 | Disposition: A | Discharge status: Home / Self Care | Payer: PPO | Source: Ambulatory Visit | Attending: Family Medicine | Admitting: Family Medicine

## 2018-02-16 ENCOUNTER — Encounter: Payer: Self-pay | Admitting: Family Medicine

## 2018-02-16 VITALS — BP 130/86 | Ht 70.0 in | Wt 140.4 lb

## 2018-02-16 DIAGNOSIS — N183 Chronic kidney disease, stage 3 unspecified: Secondary | ICD-10-CM

## 2018-02-16 DIAGNOSIS — G894 Chronic pain syndrome: Secondary | ICD-10-CM

## 2018-02-16 DIAGNOSIS — R0609 Other forms of dyspnea: Secondary | ICD-10-CM | POA: Diagnosis not present

## 2018-02-16 DIAGNOSIS — E038 Other specified hypothyroidism: Secondary | ICD-10-CM

## 2018-02-16 DIAGNOSIS — I480 Paroxysmal atrial fibrillation: Secondary | ICD-10-CM

## 2018-02-16 DIAGNOSIS — J449 Chronic obstructive pulmonary disease, unspecified: Secondary | ICD-10-CM | POA: Diagnosis not present

## 2018-02-16 DIAGNOSIS — R0602 Shortness of breath: Secondary | ICD-10-CM | POA: Diagnosis not present

## 2018-02-16 LAB — CUP PACEART REMOTE DEVICE CHECK
Battery Remaining Longevity: 90 mo
Brady Statistic AP VP Percent: 2 %
Brady Statistic AS VP Percent: 2 %
Brady Statistic AS VS Percent: 96 %
Date Time Interrogation Session: 20190514212123
Implantable Lead Implant Date: 20120810
Implantable Lead Location: 753860
Implantable Pulse Generator Implant Date: 20120810
Lead Channel Impedance Value: 439 Ohm
Lead Channel Pacing Threshold Amplitude: 1 V
Lead Channel Pacing Threshold Amplitude: 1 V
Lead Channel Pacing Threshold Pulse Width: 0.4 ms
Lead Channel Sensing Intrinsic Amplitude: 16 mV
Lead Channel Setting Pacing Amplitude: 2.5 V
Lead Channel Setting Sensing Sensitivity: 5.6 mV
MDC IDC LEAD IMPLANT DT: 20120810
MDC IDC LEAD LOCATION: 753859
MDC IDC MSMT BATTERY IMPEDANCE: 443 Ohm
MDC IDC MSMT BATTERY VOLTAGE: 2.79 V
MDC IDC MSMT LEADCHNL RA PACING THRESHOLD PULSEWIDTH: 0.4 ms
MDC IDC MSMT LEADCHNL RA SENSING INTR AMPL: 1 mV
MDC IDC MSMT LEADCHNL RV IMPEDANCE VALUE: 703 Ohm
MDC IDC SET LEADCHNL RA PACING AMPLITUDE: 2 V
MDC IDC SET LEADCHNL RV PACING PULSEWIDTH: 0.4 ms
MDC IDC STAT BRADY AP VS PERCENT: 0 %

## 2018-02-16 LAB — POCT HEMOGLOBIN: HEMOGLOBIN: 13 g/dL (ref 12.2–16.2)

## 2018-02-16 MED ORDER — HYDROCODONE-ACETAMINOPHEN 10-325 MG PO TABS: ORAL_TABLET | ORAL | 0 refills | Status: DC

## 2018-02-16 NOTE — Progress Notes (Signed)
Subjective:    Patient ID: Kelsey Sandoval, female    DOB: [birth year], 84 y.o.   MRN: [id number]  HPI This patient was seen today for chronic pain  The medication list was reviewed and updated.   -Compliance with medication: yes  - Number patient states they take daily: at least 6  -when was the last dose patient took? About 9:30 this morning  The patient was advised the importance of maintaining medication and not using illegal substances with these.  Here for refills and follow up  The patient was educated that we can provide 3 monthly scripts for their medication, it is their responsibility to follow the instructions.  Side effects or complications from medications: none  Patient is aware that pain medications are meant to minimize the severity of the pain to allow their pain levels to improve to allow for better function. They are aware of that pain medications cannot totally remove their pain.  Due for UDT ( at least once per year) : 11/18/2017  Pt states she has became SOB over the past few months.    Results for orders placed or performed in visit on 02/16/18  POCT hemoglobin  Result Value Ref Range   Hemoglobin 13.0 12.2 - 16.2 g/dL   The patient also relates that she has been feeling short of breath at times over the past couple months more so when she does stuff she gets short winded she denies chest tightness pressure pain she does have a history of atrial fibrillation and is on medication and is followed by cardiology  Patient states occasional cough but nothing severe denies wheezing denies sweats chills  denies fevers  Patient does have chronic kidney disease with some pedal edema but not severe  She also has hypothyroidism takes her medicine states energy level subpar the past 2 months Pain medicine does help her function she denies abusing it denies it making her feel drowsy she is able to function better with it Denies depression issues currently Review of  Systems  Constitutional: Negative for activity change, appetite change and fatigue.  HENT: Negative for congestion and rhinorrhea.   Respiratory: Negative for cough.   Cardiovascular: Positive for leg swelling. Negative for chest pain.  Gastrointestinal: Negative for abdominal pain, constipation and diarrhea.  Endocrine: Negative for polydipsia and polyphagia.  Genitourinary: Negative for flank pain and frequency.  Musculoskeletal: Positive for arthralgias and back pain.  Skin: Negative for color change.  Neurological: Negative for dizziness, weakness and headaches.  Psychiatric/Behavioral: Negative for confusion.       Objective:   Physical Exam  Constitutional: She appears well-developed and well-nourished. No distress.  HENT:  Head: Normocephalic and atraumatic.  Eyes: Right eye exhibits no discharge. Left eye exhibits no discharge.  Neck: No tracheal deviation present.  Cardiovascular: Normal rate, regular rhythm and normal heart sounds.  No murmur heard. Pulmonary/Chest: Effort normal and breath sounds normal. No respiratory distress. She has no wheezes. She has no rales.  Musculoskeletal: She exhibits no edema or deformity.  Lymphadenopathy:    She has no cervical adenopathy.  Neurological: She is alert. She exhibits normal muscle tone.  Skin: Skin is warm and dry.  Psychiatric: Her behavior is normal.  Vitals reviewed.         Assessment & Plan:  DOE-important for the patient to do the best she can we will go ahead and set her up with cardiology as well as set her up for chest x-ray lab work  Atrial  fibrillation rate is under good control continue current measures hypothyroidism continue medication check lab work await results    /Chronic kidney disease continue current medication check lab work await results  The patient was seen in followup for chronic pain. A review over at their current pain status was discussed. Discussion was held regarding the importance  of compliance with medication as well as pain medication contract. Discussion with the patient regarding the medication as it pertains to allowing for blunting of pain levels as well as improvement of function was completed. Questions regarding the pain management were answered. Importance of regular followup visits was discussed. Patient was informed that medication may cause drowsiness and should not be combined  with other medications/alcohol or street drugs. Patient was cautioned that drowsiness may impair ability to operate heavy machinery/vehicles/dangerous activities and should take this into consideration.  Drug registry checked 3 prescriptions given

## 2018-02-16 NOTE — Progress Notes (Signed)
Remote pacemaker transmission.   

## 2018-02-17 ENCOUNTER — Ambulatory Visit: Payer: PPO | Admitting: Orthopaedic Surgery

## 2018-02-17 ENCOUNTER — Encounter: Payer: Self-pay | Admitting: Orthopaedic Surgery

## 2018-02-17 ENCOUNTER — Other Ambulatory Visit: Payer: Self-pay | Admitting: *Deleted

## 2018-02-17 ENCOUNTER — Telehealth: Payer: Self-pay | Admitting: Family Medicine

## 2018-02-17 ENCOUNTER — Encounter: Payer: Self-pay | Admitting: Cardiology

## 2018-02-17 DIAGNOSIS — M25561 Pain in right knee: Secondary | ICD-10-CM

## 2018-02-17 DIAGNOSIS — I480 Paroxysmal atrial fibrillation: Secondary | ICD-10-CM

## 2018-02-17 DIAGNOSIS — M25562 Pain in left knee: Secondary | ICD-10-CM

## 2018-02-17 DIAGNOSIS — G8929 Other chronic pain: Secondary | ICD-10-CM

## 2018-02-17 LAB — BASIC METABOLIC PANEL
BUN/Creatinine Ratio: 19 (ref 12–28)
BUN: 21 mg/dL (ref 8–27)
CALCIUM: 9.1 mg/dL (ref 8.7–10.3)
CO2: 22 mmol/L (ref 20–29)
Chloride: 98 mmol/L (ref 96–106)
Creatinine, Ser: 1.11 mg/dL — ABNORMAL HIGH (ref 0.57–1.00)
GFR calc Af Amer: 53 mL/min/{1.73_m2} — ABNORMAL LOW (ref >59)
GFR calc non Af Amer: 46 mL/min/{1.73_m2} — ABNORMAL LOW (ref >59)
GLUCOSE: 75 mg/dL (ref 65–99)
Potassium: 4.2 mmol/L (ref 3.5–5.2)
Sodium: 139 mmol/L (ref 134–144)

## 2018-02-17 LAB — HEPATIC FUNCTION PANEL
ALT: 39 IU/L — ABNORMAL HIGH (ref 0–32)
AST: 58 IU/L — ABNORMAL HIGH (ref 0–40)
Albumin: 4.1 g/dL (ref 3.5–4.7)
Alkaline Phosphatase: 177 IU/L — ABNORMAL HIGH (ref 39–117)
BILIRUBIN TOTAL: 0.3 mg/dL (ref 0.0–1.2)
BILIRUBIN, DIRECT: 0.13 mg/dL (ref 0.00–0.40)
TOTAL PROTEIN: 6.4 g/dL (ref 6.0–8.5)

## 2018-02-17 LAB — TSH: TSH: 3.14 u[IU]/mL (ref 0.450–4.500)

## 2018-02-17 MED ORDER — ALBUTEROL SULFATE HFA 108 (90 BASE) MCG/ACT IN AERS: 2.0000 | INHALATION_SPRAY | Freq: Four times a day (QID) | RESPIRATORY_TRACT | 2 refills | Status: DC | PRN

## 2018-02-17 MED ORDER — METOPROLOL SUCCINATE ER 50 MG PO TB24: 50.0000 mg | ORAL_TABLET | Freq: Two times a day (BID) | ORAL | 1 refills | Status: DC

## 2018-02-17 NOTE — Telephone Encounter (Signed)
She may have 2 refills on the albuterol, may go ahead and at 90-day supply of metoprolol with one additional refill

## 2018-02-17 NOTE — Telephone Encounter (Signed)
meds sent to pharm. Pt notified.  

## 2018-02-17 NOTE — Progress Notes (Signed)
CC: Both of my knees are hurting. I would like an injection in both knees.  The patient has had chronic pain and tenderness of both knees for some time.  Injections help.  There is no locking or giving way of the knee.  There is no new trauma. There is no redness or signs of infections.  The knees have a mild effusion and some crepitus.  There is no redness or signs of recent trauma.  Right knee ROM is 0-100 and left knee ROM is 0-95.  Impression:  Chronic pain of the both knees  Return:  prn  PROCEDURE NOTE:  The patient requests injections of both knees, verbal consent was obtained.  The left and right knee were individually prepped appropriately after time out was performed.   Sterile technique was observed and injection of 1 cc of Depo-Medrol 40 mg with several cc's of plain xylocaine. Anesthesia was provided by ethyl chloride and a 20-gauge needle was used to inject each knee area. The injections were tolerated well.  A band aid dressing was applied.  The patient was advised to apply ice later today and tomorrow to the injection sight as needed.    Electronically Signed Sanjuana Kava, MD 5/16/20192:35 PM

## 2018-02-17 NOTE — Telephone Encounter (Signed)
Only inhaler on med list is albuterol but last sent in 2015. Pt states she discussed sob with you yesterday and an inhaler was going to be sent in but she does not know the name of it. And the metoprolol was last prescribed by DR. Lovena Le.

## 2018-02-17 NOTE — Telephone Encounter (Signed)
Patient said that she was supposed to have Rx for Metoprolol and inhaler called in yesterday by Dr. Nicki Reaper.  Please advise.  [facility]

## 2018-02-17 NOTE — Addendum Note (Signed)
Addended by: Karle Barr on: 02/17/2018 02:03 PM   Modules accepted: Orders

## 2018-02-18 ENCOUNTER — Other Ambulatory Visit: Payer: Self-pay | Admitting: *Deleted

## 2018-02-18 DIAGNOSIS — R0609 Other forms of dyspnea: Principal | ICD-10-CM

## 2018-02-18 DIAGNOSIS — R748 Abnormal levels of other serum enzymes: Secondary | ICD-10-CM

## 2018-02-21 ENCOUNTER — Telehealth: Payer: Self-pay

## 2018-02-21 ENCOUNTER — Other Ambulatory Visit: Payer: Self-pay | Admitting: Internal Medicine

## 2018-02-21 ENCOUNTER — Telehealth: Payer: Self-pay | Admitting: *Deleted

## 2018-02-21 DIAGNOSIS — I480 Paroxysmal atrial fibrillation: Secondary | ICD-10-CM

## 2018-02-21 NOTE — Telephone Encounter (Signed)
Patient is scheduled for U/s abd and needs to be e signed please

## 2018-02-21 NOTE — Telephone Encounter (Signed)
Pt called and said she never heard back about pft test. They left her a message but she has tried to call them back with no response. I called and left a message with [facility] resp scheduling to get pt scheduled. She cannot go on may 30th, June 3rd, June 7th. She is also going out of town this weekend so not on Friday may 24th or monday27th.

## 2018-02-22 NOTE — Telephone Encounter (Signed)
Patient is aware scheduled for Tuesday May 28,2019 at 11:00 am.

## 2018-02-23 ENCOUNTER — Encounter: Payer: Self-pay | Admitting: Family Medicine

## 2018-02-23 ENCOUNTER — Encounter (INDEPENDENT_AMBULATORY_CARE_PROVIDER_SITE_OTHER): Payer: Self-pay

## 2018-02-25 ENCOUNTER — Ambulatory Visit (HOSPITAL_COMMUNITY): Payer: Self-pay

## 2018-03-01 ENCOUNTER — Inpatient Hospital Stay (HOSPITAL_COMMUNITY): Admission: RE | Admit: 2018-03-01 | Payer: Self-pay | Source: Ambulatory Visit

## 2018-03-03 ENCOUNTER — Ambulatory Visit (HOSPITAL_COMMUNITY)
Admission: RE | Admit: 2018-03-03 | Discharge: 2018-03-03 | Disposition: A | Discharge status: Home / Self Care | Payer: PPO | Source: Ambulatory Visit | Attending: Family Medicine | Admitting: Family Medicine

## 2018-03-03 DIAGNOSIS — Z7901 Long term (current) use of anticoagulants: Secondary | ICD-10-CM | POA: Insufficient documentation

## 2018-03-03 DIAGNOSIS — R748 Abnormal levels of other serum enzymes: Secondary | ICD-10-CM | POA: Diagnosis not present

## 2018-03-03 DIAGNOSIS — E785 Hyperlipidemia, unspecified: Secondary | ICD-10-CM | POA: Diagnosis not present

## 2018-03-03 DIAGNOSIS — R0609 Other forms of dyspnea: Secondary | ICD-10-CM | POA: Diagnosis not present

## 2018-03-03 DIAGNOSIS — I081 Rheumatic disorders of both mitral and tricuspid valves: Secondary | ICD-10-CM | POA: Insufficient documentation

## 2018-03-03 DIAGNOSIS — I495 Sick sinus syndrome: Secondary | ICD-10-CM | POA: Insufficient documentation

## 2018-03-03 DIAGNOSIS — I480 Paroxysmal atrial fibrillation: Secondary | ICD-10-CM | POA: Diagnosis not present

## 2018-03-03 NOTE — Progress Notes (Signed)
*  PRELIMINARY RESULTS* Echocardiogram 2D Echocardiogram has been performed.  Kelsey Sandoval 03/03/2018, 11:33 AM

## 2018-03-04 NOTE — Patient Instructions (Signed)
Your procedure is scheduled on: 03/11/2018  Report to [facility] at   640  AM.  Call this number if you have problems the morning of surgery: [phone]   Do not eat food or drink liquids :After Midnight.      Take these medicines the morning of surgery with A SIP OF WATER: xanax, amlodipine, buspar, cymbalta, gabapentin, levothyroxine, lisinopril, metoprolol, zofran, protonix. Use your inhaler before you come.   Do not wear jewelry, make-up or nail polish.  Do not wear lotions, powders, or perfumes. You may wear deodorant.  Do not shave 48 hours prior to surgery.  Do not bring valuables to the hospital.  Contacts, dentures or bridgework may not be worn into surgery.  Leave suitcase in the car. After surgery it may be brought to your room.  For patients admitted to the hospital, checkout time is 11:00 AM the day of discharge.   Patients discharged the day of surgery will not be allowed to drive home.  :     Please read over the following fact sheets that you were given: Coughing and Deep Breathing, Surgical Site Infection Prevention, Anesthesia Post-op Instructions and Care and Recovery After Surgery    Cataract A cataract is a clouding of the lens of the eye. When a lens becomes cloudy, vision is reduced based on the degree and nature of the clouding. Many cataracts reduce vision to some degree. Some cataracts make people more near-sighted as they develop. Other cataracts increase glare. Cataracts that are ignored and become worse can sometimes look white. The white color can be seen through the pupil. CAUSES   Aging. However, cataracts may occur at any age, even in newborns.   Certain drugs.   Trauma to the eye.   Certain diseases such as diabetes.   Specific eye diseases such as chronic inflammation inside the eye or a sudden attack of a rare form of glaucoma.   Inherited or acquired medical problems.  SYMPTOMS   Gradual, progressive drop in vision in the affected eye.    Severe, rapid visual loss. This most often happens when trauma is the cause.  DIAGNOSIS  To detect a cataract, an eye doctor examines the lens. Cataracts are best diagnosed with an exam of the eyes with the pupils enlarged (dilated) by drops.  TREATMENT  For an early cataract, vision may improve by using different eyeglasses or stronger lighting. If that does not help your vision, surgery is the only effective treatment. A cataract needs to be surgically removed when vision loss interferes with your everyday activities, such as driving, reading, or watching TV. A cataract may also have to be removed if it prevents examination or treatment of another eye problem. Surgery removes the cloudy lens and usually replaces it with a substitute lens (intraocular lens, IOL).  At a time when both you and your doctor agree, the cataract will be surgically removed. If you have cataracts in both eyes, only one is usually removed at a time. This allows the operated eye to heal and be out of danger from any possible problems after surgery (such as infection or poor wound healing). In rare cases, a cataract may be doing damage to your eye. In these cases, your caregiver may advise surgical removal right away. The vast majority of people who have cataract surgery have better vision afterward. HOME CARE INSTRUCTIONS  If you are not planning surgery, you may be asked to do the following:  Use different eyeglasses.   Use  stronger or brighter lighting.   Ask your eye doctor about reducing your medicine dose or changing medicines if it is thought that a medicine caused your cataract. Changing medicines does not make the cataract go away on its own.   Become familiar with your surroundings. Poor vision can lead to injury. Avoid bumping into things on the affected side. You are at a higher risk for tripping or falling.   Exercise extreme care when driving or operating machinery.   Wear sunglasses if you are sensitive  to bright light or experiencing problems with glare.  SEEK IMMEDIATE MEDICAL CARE IF:   You have a worsening or sudden vision loss.   You notice redness, swelling, or increasing pain in the eye.   You have a fever.  Document Released: 09/21/2005 Document Revised: 09/10/2011 Document Reviewed: 05/15/2011 [facility] Patient Information 2012 [place].PATIENT INSTRUCTIONS POST-ANESTHESIA  IMMEDIATELY FOLLOWING SURGERY:  Do not drive or operate machinery for the first twenty four hours after surgery.  Do not make any important decisions for twenty four hours after surgery or while taking narcotic pain medications or sedatives.  If you develop intractable nausea and vomiting or a severe headache please notify your doctor immediately.  FOLLOW-UP:  Please make an appointment with your surgeon as instructed. You do not need to follow up with anesthesia unless specifically instructed to do so.  WOUND CARE INSTRUCTIONS (if applicable):  Keep a dry clean dressing on the anesthesia/puncture wound site if there is drainage.  Once the wound has quit draining you may leave it open to air.  Generally you should leave the bandage intact for twenty four hours unless there is drainage.  If the epidural site drains for more than 36-48 hours please call the anesthesia department.  QUESTIONS?:  Please feel free to call your physician or the hospital operator if you have any questions, and they will be happy to assist you.

## 2018-03-06 ENCOUNTER — Other Ambulatory Visit: Payer: Self-pay | Admitting: Family Medicine

## 2018-03-07 ENCOUNTER — Encounter (HOSPITAL_COMMUNITY): Payer: Self-pay

## 2018-03-07 ENCOUNTER — Encounter: Payer: Self-pay | Admitting: Family Medicine

## 2018-03-07 ENCOUNTER — Encounter (HOSPITAL_COMMUNITY)
Admission: RE | Admit: 2018-03-07 | Discharge: 2018-03-07 | Disposition: A | Discharge status: Home / Self Care | Payer: PPO | Source: Ambulatory Visit | Attending: Ophthalmology | Admitting: Ophthalmology

## 2018-03-07 ENCOUNTER — Other Ambulatory Visit: Payer: Self-pay

## 2018-03-07 ENCOUNTER — Telehealth: Payer: Self-pay

## 2018-03-07 ENCOUNTER — Encounter (INDEPENDENT_AMBULATORY_CARE_PROVIDER_SITE_OTHER): Payer: Self-pay

## 2018-03-07 DIAGNOSIS — Z01818 Encounter for other preprocedural examination: Secondary | ICD-10-CM | POA: Insufficient documentation

## 2018-03-07 HISTORY — DX: Hypothyroidism, unspecified: E03.9

## 2018-03-07 LAB — CBC WITH DIFFERENTIAL/PLATELET
Basophils Absolute: 0 10*3/uL (ref 0.0–0.1)
Basophils Relative: 0 %
Eosinophils Absolute: 0.1 10*3/uL (ref 0.0–0.7)
Eosinophils Relative: 1 %
HEMATOCRIT: 40.2 % (ref 36.0–46.0)
Hemoglobin: 12.7 g/dL (ref 12.0–15.0)
LYMPHS ABS: 1.7 10*3/uL (ref 0.7–4.0)
LYMPHS PCT: 18 %
MCH: 30 pg (ref 26.0–34.0)
MCHC: 31.6 g/dL (ref 30.0–36.0)
MCV: 94.8 fL (ref 78.0–100.0)
MONO ABS: 0.8 10*3/uL (ref 0.1–1.0)
MONOS PCT: 9 %
NEUTROS ABS: 6.5 10*3/uL (ref 1.7–7.7)
Neutrophils Relative %: 72 %
Platelets: 192 10*3/uL (ref 150–400)
RBC: 4.24 MIL/uL (ref 3.87–5.11)
RDW: 13.7 % (ref 11.5–15.5)
WBC: 9.1 10*3/uL (ref 4.0–10.5)

## 2018-03-07 LAB — BASIC METABOLIC PANEL
ANION GAP: 6 (ref 5–15)
BUN: 22 mg/dL — ABNORMAL HIGH (ref 6–20)
CALCIUM: 9.2 mg/dL (ref 8.9–10.3)
CO2: 29 mmol/L (ref 22–32)
CREATININE: 1.13 mg/dL — AB (ref 0.44–1.00)
Chloride: 105 mmol/L (ref 101–111)
GFR calc Af Amer: 51 mL/min — ABNORMAL LOW (ref >60)
GFR calc non Af Amer: 44 mL/min — ABNORMAL LOW (ref >60)
Glucose, Bld: 83 mg/dL (ref 65–99)
Potassium: 4.9 mmol/L (ref 3.5–5.1)
Sodium: 140 mmol/L (ref 135–145)

## 2018-03-07 NOTE — Telephone Encounter (Signed)
Appt scheduled, notified pt - [facility] & mailed letter

## 2018-03-07 NOTE — Telephone Encounter (Signed)
Patient would like the cardiology appt after this Friday. She is having eye surgery on Friday.Thanks,

## 2018-03-08 DIAGNOSIS — H25811 Combined forms of age-related cataract, right eye: Secondary | ICD-10-CM | POA: Diagnosis not present

## 2018-03-10 ENCOUNTER — Telehealth: Payer: Self-pay | Admitting: Family Medicine

## 2018-03-10 NOTE — Telephone Encounter (Signed)
Pharmacist from [facility] called to make sure it was ok to fill the hydrocodone 10/325 for this patient. He stated that with all the changes and that since patient is on opioid and benzo combos, the pharmacy has to make sure that doctor is aware and that patient has treatment plans.

## 2018-03-10 NOTE — Telephone Encounter (Signed)
I called and spoke with Kelsey Sandoval at [facility] he states he spoke with the Head Pharmacist and she states they can not send [place] [employer].That information is proprietary, but he could send [place] a copy of the cdc recommendations.I gave the fax information to him.He states he will fax it over for our review.

## 2018-03-10 NOTE — Telephone Encounter (Signed)
I am aware of this on this patient, I am also monitoring closely.  Patient has been educated not to take pain medication near bedtime.  Please also have pharmacist if they are allowed to do so fax [place] a copy of their new policy they are following so we can do our best to be aware of it thank you

## 2018-03-11 ENCOUNTER — Ambulatory Visit (HOSPITAL_COMMUNITY)
Admission: RE | Admit: 2018-03-11 | Discharge: 2018-03-11 | Disposition: A | Discharge status: Home / Self Care | Payer: PPO | Source: Ambulatory Visit | Attending: Ophthalmology | Admitting: Ophthalmology

## 2018-03-11 ENCOUNTER — Ambulatory Visit (HOSPITAL_COMMUNITY): Payer: PPO | Admitting: Anesthesiology

## 2018-03-11 ENCOUNTER — Encounter (HOSPITAL_COMMUNITY): Payer: Self-pay | Admitting: Anesthesiology

## 2018-03-11 ENCOUNTER — Encounter (HOSPITAL_COMMUNITY)
Admission: RE | Disposition: A | Discharge status: Home / Self Care | Payer: Self-pay | Source: Ambulatory Visit | Attending: Ophthalmology

## 2018-03-11 DIAGNOSIS — H4389 Other disorders of vitreous body: Secondary | ICD-10-CM | POA: Insufficient documentation

## 2018-03-11 DIAGNOSIS — I10 Essential (primary) hypertension: Secondary | ICD-10-CM | POA: Diagnosis not present

## 2018-03-11 DIAGNOSIS — E7800 Pure hypercholesterolemia, unspecified: Secondary | ICD-10-CM | POA: Diagnosis not present

## 2018-03-11 DIAGNOSIS — Z880 Allergy status to penicillin: Secondary | ICD-10-CM | POA: Diagnosis not present

## 2018-03-11 DIAGNOSIS — D649 Anemia, unspecified: Secondary | ICD-10-CM | POA: Diagnosis not present

## 2018-03-11 DIAGNOSIS — H269 Unspecified cataract: Secondary | ICD-10-CM | POA: Insufficient documentation

## 2018-03-11 DIAGNOSIS — H25811 Combined forms of age-related cataract, right eye: Secondary | ICD-10-CM | POA: Diagnosis not present

## 2018-03-11 DIAGNOSIS — Z950 Presence of cardiac pacemaker: Secondary | ICD-10-CM | POA: Diagnosis not present

## 2018-03-11 DIAGNOSIS — H2511 Age-related nuclear cataract, right eye: Secondary | ICD-10-CM | POA: Diagnosis not present

## 2018-03-11 DIAGNOSIS — Z79899 Other long term (current) drug therapy: Secondary | ICD-10-CM | POA: Diagnosis not present

## 2018-03-11 DIAGNOSIS — H59211 Accidental puncture and laceration of right eye and adnexa during an ophthalmic procedure: Secondary | ICD-10-CM | POA: Diagnosis not present

## 2018-03-11 DIAGNOSIS — E785 Hyperlipidemia, unspecified: Secondary | ICD-10-CM | POA: Diagnosis not present

## 2018-03-11 DIAGNOSIS — Z882 Allergy status to sulfonamides status: Secondary | ICD-10-CM | POA: Insufficient documentation

## 2018-03-11 DIAGNOSIS — I4891 Unspecified atrial fibrillation: Secondary | ICD-10-CM | POA: Insufficient documentation

## 2018-03-11 HISTORY — PX: ANTERIOR VITRECTOMY: SHX1173

## 2018-03-11 HISTORY — PX: CATARACT EXTRACTION W/PHACO: SHX586

## 2018-03-11 SURGERY — PHACOEMULSIFICATION, CATARACT, WITH IOL INSERTION
Anesthesia: Monitor Anesthesia Care | Site: Eye | Laterality: Right

## 2018-03-11 MED ORDER — CYCLOPENTOLATE-PHENYLEPHRINE 0.2-1 % OP SOLN
1.0000 [drp] | OPHTHALMIC | Status: AC
  Administered 2018-03-11 (×3): 1 [drp] via OPHTHALMIC

## 2018-03-11 MED ORDER — LIDOCAINE HCL 3.5 % OP GEL
1.0000 "application " | Freq: Once | OPHTHALMIC | Status: AC
  Administered 2018-03-11: 1 via OPHTHALMIC

## 2018-03-11 MED ORDER — MIDAZOLAM HCL 5 MG/5ML IJ SOLN
INTRAMUSCULAR | Status: DC | PRN
  Administered 2018-03-11 (×3): 1 mg via INTRAVENOUS

## 2018-03-11 MED ORDER — NA HYALUR & NA CHOND-NA HYALUR 0.55-0.5 ML IO KIT
PACK | INTRAOCULAR | Status: DC | PRN
  Administered 2018-03-11: 1 via OPHTHALMIC

## 2018-03-11 MED ORDER — POVIDONE-IODINE 5 % OP SOLN
OPHTHALMIC | Status: DC | PRN
  Administered 2018-03-11: 1 via OPHTHALMIC

## 2018-03-11 MED ORDER — MIDAZOLAM HCL 2 MG/2ML IJ SOLN
INTRAMUSCULAR | Status: AC
  Filled 2018-03-11: qty 2

## 2018-03-11 MED ORDER — PROVISC 10 MG/ML IO SOLN
INTRAOCULAR | Status: DC | PRN
  Administered 2018-03-11: .85 mL via INTRAOCULAR

## 2018-03-11 MED ORDER — LACTATED RINGERS IV SOLN
INTRAVENOUS | Status: DC | PRN
  Administered 2018-03-11: 08:00:00 via INTRAVENOUS

## 2018-03-11 MED ORDER — NEOMYCIN-POLYMYXIN-DEXAMETH 3.5-10000-0.1 OP SUSP
OPHTHALMIC | Status: DC | PRN
  Administered 2018-03-11: 2 [drp] via OPHTHALMIC

## 2018-03-11 MED ORDER — PHENYLEPHRINE HCL 2.5 % OP SOLN
1.0000 [drp] | OPHTHALMIC | Status: AC
  Administered 2018-03-11 (×3): 1 [drp] via OPHTHALMIC

## 2018-03-11 MED ORDER — EPINEPHRINE PF 1 MG/ML IJ SOLN
INTRAOCULAR | Status: DC | PRN
  Administered 2018-03-11: 500 mL

## 2018-03-11 MED ORDER — PROVISC 10 MG/ML IO SOLN
INTRAOCULAR | Status: DC | PRN
  Administered 2018-03-11: 0.85 mL via INTRAOCULAR

## 2018-03-11 MED ORDER — BSS IO SOLN
INTRAOCULAR | Status: DC | PRN
  Administered 2018-03-11: 15 mL

## 2018-03-11 MED ORDER — SODIUM HYALURONATE 23 MG/ML IO SOLN
INTRAOCULAR | Status: DC | PRN
  Administered 2018-03-11: 0.6 mL via INTRAOCULAR

## 2018-03-11 MED ORDER — LIDOCAINE HCL (PF) 1 % IJ SOLN
INTRAOCULAR | Status: DC | PRN
  Administered 2018-03-11: .9 mL via OPHTHALMIC

## 2018-03-11 MED ORDER — TETRACAINE HCL 0.5 % OP SOLN
1.0000 [drp] | OPHTHALMIC | Status: AC
  Administered 2018-03-11 (×3): 1 [drp] via OPHTHALMIC

## 2018-03-11 SURGICAL SUPPLY — 14 items
CLOTH BEACON ORANGE TIMEOUT ST (SAFETY) ×2 IMPLANT
EYE SHIELD UNIVERSAL CLEAR (GAUZE/BANDAGES/DRESSINGS) ×2 IMPLANT
GLOVE BIOGEL PI IND STRL 7.0 (GLOVE) IMPLANT
GLOVE BIOGEL PI INDICATOR 7.0 (GLOVE) ×4
LENS ALC ACRYL/TECN (Ophthalmic Related) ×2 IMPLANT
NDL HYPO 18GX1.5 BLUNT FILL (NEEDLE) IMPLANT
NEEDLE HYPO 18GX1.5 BLUNT FILL (NEEDLE) ×3 IMPLANT
PACK VIT ANT 23G (MISCELLANEOUS) ×2 IMPLANT
PAD ARMBOARD 7.5X6 YLW CONV (MISCELLANEOUS) ×2 IMPLANT
SYR TB 1ML LL NO SAFETY (SYRINGE) ×2 IMPLANT
TAPE SURG TRANSPORE 1 IN (GAUZE/BANDAGES/DRESSINGS) IMPLANT
TAPE SURGICAL TRANSPORE 1 IN (GAUZE/BANDAGES/DRESSINGS) ×2
VISCOELASTIC ADDITIONAL (OPHTHALMIC RELATED) ×6 IMPLANT
WATER STERILE IRR 250ML POUR (IV SOLUTION) ×2 IMPLANT

## 2018-03-11 NOTE — Anesthesia Postprocedure Evaluation (Signed)
Anesthesia Post Note  Patient: ANAISABEL PEDERSON  Procedure(s) Performed: CATARACT EXTRACTION PHACO  AND INTRAOCULAR LENS PLACEMENT RIGHT EYE  (Right Eye) ANTERIOR VITRECTOMY (Right Eye)  Patient location during evaluation: Short Stay Anesthesia Type: MAC Level of consciousness: awake and alert and oriented Pain management: pain level controlled Vital Signs Assessment: post-procedure vital signs reviewed and stable Respiratory status: spontaneous breathing Cardiovascular status: blood pressure returned to baseline and stable Postop Assessment: no apparent nausea or vomiting Anesthetic complications: no     Last Vitals:  Vitals:   03/11/18 0708  BP: (!) 176/99  Pulse: 72  Resp: 18  Temp: 37 C  SpO2: 100%    Last Pain:  Vitals:   03/11/18 0716  TempSrc:   PainSc: 0-No pain                 Alohilani Levenhagen

## 2018-03-11 NOTE — H&P (Signed)
The H and P was reviewed and updated. The patient was examined.  No changes were found after exam.  The surgical eye was marked.  

## 2018-03-11 NOTE — Progress Notes (Signed)
Patient complaining of seeing "3 big balls" whenever she looks down. Dr. Marisa Hua notified and will re-assess patient shortly.

## 2018-03-11 NOTE — Progress Notes (Signed)
Dr. Marisa Hua re-assessed patient and reassured her that this is to be expected. Patient ready for discharge.

## 2018-03-11 NOTE — Discharge Instructions (Signed)
Please discharge patient when stable, will follow up today with Dr. Marisa Hua at the [facility] office immediately following discharge.  Leave shield in place until visit.  All paperwork with discharge instructions will be given at the office.   See [facility] Post-operative Cataract Instructions  Monitored Anesthesia Care, Care After These instructions provide you with information about caring for yourself after your procedure. Your health care provider may also give you more specific instructions. Your treatment has been planned according to current medical practices, but problems sometimes occur. Call your health care provider if you have any problems or questions after your procedure. What can I expect after the procedure? After your procedure, it is common to:  Feel sleepy for several hours.  Feel clumsy and have poor balance for several hours.  Feel forgetful about what happened after the procedure.  Have poor judgment for several hours.  Feel nauseous or vomit.  Have a sore throat if you had a breathing tube during the procedure.  Follow these instructions at home: For at least 24 hours after the procedure:   Do not: ? Participate in activities in which you could fall or become injured. ? Drive. ? Use heavy machinery. ? Drink alcohol. ? Take sleeping pills or medicines that cause drowsiness. ? Make important decisions or sign legal documents. ? Take care of children on your own.  Rest. Eating and drinking  Follow the diet that is recommended by your health care provider.  If you vomit, drink water, juice, or soup when you can drink without vomiting.  Make sure you have little or no nausea before eating solid foods. General instructions  Have a responsible adult stay with you until you are awake and alert.  Take over-the-counter and prescription medicines only as told by your health care provider.  If you smoke, do not smoke without  supervision.  Keep all follow-up visits as told by your health care provider. This is important. Contact a health care provider if:  You keep feeling nauseous or you keep vomiting.  You feel light-headed.  You develop a rash.  You have a fever. Get help right away if:  You have trouble breathing. This information is not intended to replace advice given to you by your health care provider. Make sure you discuss any questions you have with your health care provider. Document Released: 01/12/2016 Document Revised: 05/13/2016 Document Reviewed: 01/12/2016 Elsevier Interactive Patient Education  [employer].

## 2018-03-11 NOTE — Op Note (Addendum)
Date of procedure: 03/11/18  Pre-operative diagnosis: Visually significant cataract, Right Eye (H25.?1)  Post-operative diagnosis: Visually significant cataract, Right Eye; Anterior capsule tear with posterior extension  Procedure: Removal of cataract via phacoemulsification and insertion of intra-ocular lens Alcon MA60AC18.5D into the Right Eye; Anterior Vitrectomy, Right Eye  Attending surgeon: Gerda Diss. Han Lysne, MD, MA  Anesthesia: MAC, Topical Akten  Complications: Anterior and posterior capsular tear, displacement of implanted IOL  Estimated Blood Loss: <59m (minimal)  Specimens: None  Implants: As above  Indications:  Visually significant cataract, Right Eye  Procedure:  The patient was seen and identified in the pre-operative area. The operative eye was identified and dilated.  The operative eye was marked.  Topical anesthesia was administered to the operative eye.     The patient was then to the operative suite and placed in the supine position.  A timeout was performed confirming the patient, procedure to be performed, and all other relevant information.   The patient's face was prepped and draped in the usual fashion for intra-ocular surgery.  A lid speculum was placed into the operative eye and the surgical microscope moved into place and focused.  A superotemporal paracentesis was created using a 20 gauge paracentesis blade.  Shugarcaine was injected into the anterior chamber.  Viscoelastic was injected into the anterior chamber.  A temporal clear-corneal main wound incision was created using a 2.475mmicrokeratome.  A continuous curvilinear capsulorrhexis was initiated using an irrigating cystitome and completed using capsulorrhexis forceps.  Hydrodissection and hydrodeliniation were performed.  Viscoelastic was injected into the anterior chamber.  A phacoemulsification handpiece and a chopper as a second instrument were used to remove the nucleus and epinucleus. At this time an  anterior capsular tear was appreciated.  Viscoat was placed.  A posterior tear was present.  A thorough anterior vitrectomy was performed.  The cortex was removed with the vitrector.  Provisc was injected into the anterior chamber.  The wound was enlarged.  The above lens was injected.  The lens was unable to be fixated in the sulcus.  The patient began experiencing severe eye pain.  A 10-0 nylon suture was placed in the main wound and buried.  Viscoelastic was removed with the vitrector.  The eye was pressurized and pain resolved.  The IOL was found to be descending and could not be safely rescued back into the sulcus.  The clear corneal wound and paracentesis wounds were then hydrated and checked with Weck-Cels to be watertight.  The lid-speculum and drape was removed, and the patient's face was cleaned with a wet and dry 4x4.  Maxitrol was instilled in the eye before a clear shield was taped over the eye. The patient was taken to the post-operative care unit in good condition, having tolerated the procedure well.  Post-Op Instructions: [facility] and will receive all other orders and instructions.

## 2018-03-11 NOTE — Transfer of Care (Signed)
Immediate Anesthesia Transfer of Care Note  Patient: Kelsey Sandoval  Procedure(s) Performed: CATARACT EXTRACTION PHACO  AND INTRAOCULAR LENS PLACEMENT RIGHT EYE  (Right Eye) ANTERIOR VITRECTOMY (Right Eye)  Patient Location: PACU  Anesthesia Type:MAC  Level of Consciousness: awake  Airway & Oxygen Therapy: Patient Spontanous Breathing  Post-op Assessment: Report given to RN  Post vital signs: Reviewed  Last Vitals:  Vitals Value Taken Time  BP    Temp    Pulse    Resp    SpO2      Last Pain:  Vitals:   03/11/18 0716  TempSrc:   PainSc: 0-No pain      Patients Stated Pain Goal: 5 (81/10/31 5945)  Complications: No apparent anesthesia complications

## 2018-03-11 NOTE — Anesthesia Preprocedure Evaluation (Signed)
Anesthesia Evaluation  Patient identified by MRN, date of birth, ID band Patient awake    Reviewed: Allergy & Precautions, H&P , NPO status , Patient's Chart, lab work & pertinent test results, reviewed documented beta blocker date and time   Airway Mallampati: II  TM Distance: >3 FB Neck ROM: full    Dental no notable dental hx. (+) Edentulous Lower   Pulmonary neg pulmonary ROS, pneumonia,    Pulmonary exam normal breath sounds clear to auscultation       Cardiovascular Exercise Tolerance: Good hypertension, negative cardio ROS  + dysrhythmias Atrial Fibrillation + pacemaker  Rhythm:irregular Rate:Normal     Neuro/Psych PSYCHIATRIC DISORDERS Anxiety Depression  Neuromuscular disease negative neurological ROS  negative psych ROS   GI/Hepatic negative GI ROS, Neg liver ROS, GERD  ,  Endo/Other  negative endocrine ROSHypothyroidism   Renal/GU Renal diseasenegative Renal ROS  negative genitourinary   Musculoskeletal   Abdominal   Peds  Hematology negative hematology ROS (+) anemia ,   Anesthesia Other Findings   Reproductive/Obstetrics negative OB ROS                             Anesthesia Physical Anesthesia Plan  ASA: III  Anesthesia Plan: MAC   Post-op Pain Management:    Induction:   PONV Risk Score and Plan:   Airway Management Planned:   Additional Equipment:   Intra-op Plan:   Post-operative Plan:   Informed Consent: I have reviewed the patients History and Physical, chart, labs and discussed the procedure including the risks, benefits and alternatives for the proposed anesthesia with the patient or authorized representative who has indicated his/her understanding and acceptance.   Dental Advisory Given  Plan Discussed with: CRNA  Anesthesia Plan Comments:         Anesthesia Quick Evaluation

## 2018-03-14 ENCOUNTER — Encounter (HOSPITAL_COMMUNITY): Payer: Self-pay | Admitting: Ophthalmology

## 2018-03-14 DIAGNOSIS — H35363 Drusen (degenerative) of macula, bilateral: Secondary | ICD-10-CM | POA: Diagnosis not present

## 2018-03-14 DIAGNOSIS — H33191 Other retinoschisis and retinal cysts, right eye: Secondary | ICD-10-CM | POA: Diagnosis not present

## 2018-03-14 DIAGNOSIS — H43813 Vitreous degeneration, bilateral: Secondary | ICD-10-CM | POA: Diagnosis not present

## 2018-03-14 DIAGNOSIS — T8522XA Displacement of intraocular lens, initial encounter: Secondary | ICD-10-CM | POA: Diagnosis not present

## 2018-03-16 ENCOUNTER — Ambulatory Visit (HOSPITAL_COMMUNITY): Payer: PPO | Admitting: Psychiatry

## 2018-03-21 ENCOUNTER — Telehealth: Payer: Self-pay | Admitting: Internal Medicine

## 2018-03-21 DIAGNOSIS — H43811 Vitreous degeneration, right eye: Secondary | ICD-10-CM | POA: Diagnosis not present

## 2018-03-21 DIAGNOSIS — H33191 Other retinoschisis and retinal cysts, right eye: Secondary | ICD-10-CM | POA: Diagnosis not present

## 2018-03-21 DIAGNOSIS — T8522XA Displacement of intraocular lens, initial encounter: Secondary | ICD-10-CM | POA: Diagnosis not present

## 2018-03-21 NOTE — Telephone Encounter (Signed)
Walk In pt form-surgical Clearance dropped off placed in Triage box.

## 2018-03-22 ENCOUNTER — Telehealth: Payer: Self-pay

## 2018-03-22 NOTE — Telephone Encounter (Signed)
Pt takes Pradaxa for afib with CHADS2VASc score of 4 (age x2, sex, HTN, pre diabetes). CrCl is 37.82. Would have recommended only holding for 3 days prior to procedure, however we received clearance late and pt has already been holding her Pradaxa for procedure tomorrow. Would recommend resuming anticoagulation as soon as safe.

## 2018-03-22 NOTE — Telephone Encounter (Signed)
   [facility] Pre-operative Risk Assessment    Request for surgical clearance:  1. What type of surgery is being performed? Retinal surgery   2. When is this surgery scheduled? 03/23/2018   3. What type of clearance is required (medical clearance vs. Pharmacy clearance to hold med vs. Both)? Cardiac clearance and last pacemaker testing  4. Are there any medications that need to be held prior to surgery and how long?  Pradaxa for 5 days   5. Practice name and name of physician performing surgery? Ernst Breach MD at [facility]   6. What is your office phone number- [phone]    7.   What is your office fax number-[phone]  8.   Anesthesia type (None, local, MAC, general) ? MAC   Kelsey Sandoval 03/22/2018, 8:59 AM  _________________________________________________________________   (provider comments below)

## 2018-03-22 NOTE — Telephone Encounter (Signed)
   Primary Cardiologist: Kate Sable, MD  Chart reviewed as part of pre-operative protocol coverage. Patient was contacted 03/22/2018 in reference to pre-operative risk assessment for pending surgery as outlined below.  Kelsey Sandoval was last seen on 07/12/2017 by Dr. Bronson Ing.  Since that day, Kelsey Sandoval has done well without chest pain, shortness of breath or palpitations. Per last pacemaker interrogation on 03/03/2018 She was having 52% AT/Afib burden.   Therefore, based on ACC/AHA guidelines, the patient would be at acceptable risk for the planned procedure without further cardiovascular testing.   Per our pharmacist:  Pt takes Pradaxa for afib with CHADS2VASc score of 4 (age x2, sex, HTN, pre diabetes). CrCl is 37.82. Would have recommended only holding for 3 days prior to procedure, however we received clearance late and pt has already been holding her Pradaxa for procedure tomorrow. Would recommend resuming anticoagulation as soon as safe.          I will route this recommendation to the requesting party via Epic fax function and remove from pre-op pool.  Please call with questions.  Daune Perch, NP 03/22/2018, 1:52 PM

## 2018-03-23 DIAGNOSIS — Z882 Allergy status to sulfonamides status: Secondary | ICD-10-CM | POA: Diagnosis not present

## 2018-03-23 DIAGNOSIS — Z961 Presence of intraocular lens: Secondary | ICD-10-CM | POA: Diagnosis not present

## 2018-03-23 DIAGNOSIS — G629 Polyneuropathy, unspecified: Secondary | ICD-10-CM | POA: Diagnosis not present

## 2018-03-23 DIAGNOSIS — G2581 Restless legs syndrome: Secondary | ICD-10-CM | POA: Diagnosis not present

## 2018-03-23 DIAGNOSIS — Z9842 Cataract extraction status, left eye: Secondary | ICD-10-CM | POA: Diagnosis not present

## 2018-03-23 DIAGNOSIS — Z950 Presence of cardiac pacemaker: Secondary | ICD-10-CM | POA: Diagnosis not present

## 2018-03-23 DIAGNOSIS — H2701 Aphakia, right eye: Secondary | ICD-10-CM | POA: Diagnosis not present

## 2018-03-23 DIAGNOSIS — T8522XA Displacement of intraocular lens, initial encounter: Secondary | ICD-10-CM | POA: Diagnosis not present

## 2018-03-23 DIAGNOSIS — I10 Essential (primary) hypertension: Secondary | ICD-10-CM | POA: Diagnosis not present

## 2018-03-23 DIAGNOSIS — E039 Hypothyroidism, unspecified: Secondary | ICD-10-CM | POA: Diagnosis not present

## 2018-03-23 DIAGNOSIS — F329 Major depressive disorder, single episode, unspecified: Secondary | ICD-10-CM | POA: Diagnosis not present

## 2018-03-23 DIAGNOSIS — H27111 Subluxation of lens, right eye: Secondary | ICD-10-CM | POA: Diagnosis not present

## 2018-03-23 DIAGNOSIS — Z793 Long term (current) use of hormonal contraceptives: Secondary | ICD-10-CM | POA: Diagnosis not present

## 2018-03-23 DIAGNOSIS — I4891 Unspecified atrial fibrillation: Secondary | ICD-10-CM | POA: Diagnosis not present

## 2018-03-23 DIAGNOSIS — E7800 Pure hypercholesterolemia, unspecified: Secondary | ICD-10-CM | POA: Diagnosis not present

## 2018-03-23 DIAGNOSIS — Z880 Allergy status to penicillin: Secondary | ICD-10-CM | POA: Diagnosis not present

## 2018-03-23 DIAGNOSIS — E785 Hyperlipidemia, unspecified: Secondary | ICD-10-CM | POA: Diagnosis not present

## 2018-03-23 DIAGNOSIS — F410 Panic disorder [episodic paroxysmal anxiety] without agoraphobia: Secondary | ICD-10-CM | POA: Diagnosis not present

## 2018-03-23 DIAGNOSIS — Z9841 Cataract extraction status, right eye: Secondary | ICD-10-CM | POA: Diagnosis not present

## 2018-03-31 ENCOUNTER — Encounter: Payer: Self-pay | Admitting: Nurse Practitioner

## 2018-04-04 ENCOUNTER — Telehealth: Payer: Self-pay | Admitting: Family Medicine

## 2018-04-04 NOTE — Telephone Encounter (Signed)
Pt called stating she's unable to keep appt 04/08/18 with cardiology due to having eye surgery & being unable to drive until her follow up  Pt needs this appointment around her schedule with other appointments  Gave patient phone number to [facility] office of [facility] - pt will call them directly to have her appointment changed

## 2018-04-08 ENCOUNTER — Encounter: Payer: Self-pay | Admitting: Nurse Practitioner

## 2018-04-17 ENCOUNTER — Other Ambulatory Visit (HOSPITAL_COMMUNITY): Payer: Self-pay | Admitting: Psychiatry

## 2018-04-20 ENCOUNTER — Ambulatory Visit: Payer: PPO | Admitting: Orthopaedic Surgery

## 2018-04-20 ENCOUNTER — Encounter: Payer: Self-pay | Admitting: Orthopaedic Surgery

## 2018-04-20 DIAGNOSIS — G8929 Other chronic pain: Secondary | ICD-10-CM | POA: Diagnosis not present

## 2018-04-20 DIAGNOSIS — M25561 Pain in right knee: Secondary | ICD-10-CM | POA: Diagnosis not present

## 2018-04-20 DIAGNOSIS — G894 Chronic pain syndrome: Secondary | ICD-10-CM

## 2018-04-20 DIAGNOSIS — M25562 Pain in left knee: Secondary | ICD-10-CM

## 2018-04-20 NOTE — Progress Notes (Signed)
CC: Both of my knees are hurting. I would like an injection in both knees.  The patient has had chronic pain and tenderness of both knees for some time.  Injections help.  There is no locking or giving way of the knee.  There is no new trauma. There is no redness or signs of infections.  The knees have a mild effusion and some crepitus.  There is no redness or signs of recent trauma.  Right knee ROM is 0-100 and left knee ROM is 0-105.  Impression:  Chronic pain of the both knees  Return:  prn  PROCEDURE NOTE:  The patient requests injections of both knees, verbal consent was obtained.  The left and right knee were individually prepped appropriately after time out was performed.   Sterile technique was observed and injection of 1 cc of Depo-Medrol 40 mg with several cc's of plain xylocaine. Anesthesia was provided by ethyl chloride and a 20-gauge needle was used to inject each knee area. The injections were tolerated well.  A band aid dressing was applied.  The patient was advised to apply ice later today and tomorrow to the injection sight as needed.   Electronically Signed Sanjuana Kava, MD 7/17/20191:53 PM

## 2018-04-26 ENCOUNTER — Ambulatory Visit (INDEPENDENT_AMBULATORY_CARE_PROVIDER_SITE_OTHER): Payer: PPO | Admitting: Internal Medicine

## 2018-04-26 ENCOUNTER — Encounter: Payer: Self-pay | Admitting: Internal Medicine

## 2018-04-26 VITALS — BP 180/94 | HR 92 | Ht 69.0 in | Wt 158.0 lb

## 2018-04-26 DIAGNOSIS — I495 Sick sinus syndrome: Secondary | ICD-10-CM | POA: Diagnosis not present

## 2018-04-26 DIAGNOSIS — I481 Persistent atrial fibrillation: Secondary | ICD-10-CM | POA: Diagnosis not present

## 2018-04-26 DIAGNOSIS — I4819 Other persistent atrial fibrillation: Secondary | ICD-10-CM

## 2018-04-26 DIAGNOSIS — I10 Essential (primary) hypertension: Secondary | ICD-10-CM

## 2018-04-26 LAB — CUP PACEART INCLINIC DEVICE CHECK
Battery Impedance: 443 Ohm
Battery Voltage: 2.78 V
Implantable Lead Implant Date: 20120810
Implantable Lead Location: 753860
Implantable Lead Model: 5076
Implantable Lead Model: 5076
Implantable Pulse Generator Implant Date: 20120810
Lead Channel Impedance Value: 456 Ohm
Lead Channel Impedance Value: 703 Ohm
Lead Channel Pacing Threshold Pulse Width: 0.4 ms
Lead Channel Pacing Threshold Pulse Width: 0.4 ms
Lead Channel Sensing Intrinsic Amplitude: 0.35 mV
Lead Channel Setting Pacing Pulse Width: 0.4 ms
Lead Channel Setting Sensing Sensitivity: 5.6 mV
MDC IDC LEAD IMPLANT DT: 20120810
MDC IDC LEAD LOCATION: 753859
MDC IDC MSMT BATTERY REMAINING LONGEVITY: 91 mo
MDC IDC MSMT LEADCHNL RV PACING THRESHOLD AMPLITUDE: 1 V
MDC IDC MSMT LEADCHNL RV PACING THRESHOLD AMPLITUDE: 1 V
MDC IDC MSMT LEADCHNL RV SENSING INTR AMPL: 15.67 mV
MDC IDC SESS DTM: 20190723135715
MDC IDC SET LEADCHNL RV PACING AMPLITUDE: 2.5 V
MDC IDC STAT BRADY RV PERCENT PACED: 33 %

## 2018-04-26 MED ORDER — AMLODIPINE BESYLATE 5 MG PO TABS: 5.0000 mg | ORAL_TABLET | Freq: Every day | ORAL | 3 refills | Status: DC

## 2018-04-26 NOTE — Progress Notes (Signed)
HPI The patient is a pleasant 84 yo woman with a h/o persistent atrial fib, symptomatic bradycardia, s/p PPM insertion. She has been stable. She c/o the price of her pradaxa. She denies chest pain or sob. No edema. Her pressures are up and she c/o being anxious when she goes to the doctors office. Allergies  Allergen Reactions  . Penicillins Other (See Comments)    Caused patient to pass out.Can take cephalosporins Has patient had a PCN reaction causing immediate rash, facial/tongue/throat swelling, SOB or lightheadedness with hypotension: no Has patient had a PCN reaction causing severe rash involving mucus membranes or skin necrosis: No Has patient had a PCN reaction that required hospitalization No Has patient had a PCN reaction occurring within the last 10 years: No If all of the above answers are "NO", then may proceed with Cephalosporin use.   . Levaquin [Levofloxacin] Nausea Only  . Sulfa Antibiotics Other (See Comments)    Unknown  . Zithromax [Azithromycin] Nausea Only and Rash     Current Outpatient Medications  Medication Sig Dispense Refill  . acetaminophen (TYLENOL) 650 MG CR tablet Take 1,300 mg by mouth every 8 (eight) hours as needed for pain.    Marland Kitchen albuterol (PROVENTIL HFA;VENTOLIN HFA) 108 (90 Base) MCG/ACT inhaler Inhale 2 puffs into the lungs every 6 (six) hours as needed for wheezing or shortness of breath. 18 g 2  . ALPRAZolam (XANAX) 0.5 MG tablet Take 1 tablet (0.5 mg total) by mouth daily as needed for anxiety. Take one qhs 30 tablet 3  . amLODipine (NORVASC) 2.5 MG tablet Take 1 tablet (2.5 mg total) by mouth daily. 90 tablet 1  . busPIRone (BUSPAR) 10 MG tablet Take 1 tablet (10 mg total) by mouth 3 (three) times daily. 90 tablet 3  . DULoxetine (CYMBALTA) 60 MG capsule TAKE 1 CAPSULE BY MOUTH TWICE DAILY 60 capsule 3  . gabapentin (NEURONTIN) 300 MG capsule TAKE 1 CAPSULE BY MOUTH IN THE MORNING, 1 CAPSULE IN THE AFTERNOON AND 2 CAPSULES IN THE EVENING.  120 capsule 5  . hydrALAZINE (APRESOLINE) 25 MG tablet TAKE 1 TABLET BY MOUTH TWICE DAILY 60 tablet 5  . HYDROcodone-acetaminophen (NORCO) 10-325 MG tablet Take one tablet every 4 hours prn pain. Max 6 tablets per day 180 tablet 0  . hydrOXYzine (ATARAX/VISTARIL) 10 MG tablet One bid prn nausea, caution drowsiness (Patient taking differently: Take 10 mg by mouth 2 (two) times daily as needed for nausea. ) 40 tablet 2  . levothyroxine (SYNTHROID, LEVOTHROID) 50 MCG tablet TAKE 1 TABLET BY MOUTH ONCE DAILY (CHANGE  IN  DOSE) 90 tablet 1  . lisinopril (PRINIVIL,ZESTRIL) 10 MG tablet TAKE 1 TABLET BY MOUTH ONCE DAILY **DISCONTINUE  20  MG  DOSE** 90 tablet 1  . metoprolol succinate (TOPROL XL) 50 MG 24 hr tablet Take 1 tablet (50 mg total) by mouth 2 (two) times daily. Take with or immediately following a meal. 180 tablet 1  . mirtazapine (REMERON) 15 MG tablet Take 1 tablet (15 mg total) by mouth at bedtime. 30 tablet 3  . ondansetron (ZOFRAN-ODT) 8 MG disintegrating tablet Take 1 tablet (8 mg total) by mouth every 8 (eight) hours as needed for nausea or vomiting. 30 tablet 5  . pantoprazole (PROTONIX) 40 MG tablet TAKE 1 TABLET BY MOUTH ONCE DAILY 90 tablet 1  . PRADAXA 150 MG CAPS capsule TAKE 1 CAPSULE BY MOUTH TWICE DAILY 180 capsule 1  . pravastatin (PRAVACHOL) 80 MG tablet Take  1 tablet (80 mg total) by mouth daily. 90 tablet 1  . vitamin B-12 (CYANOCOBALAMIN) 1000 MCG tablet Take 1 tablet (1,000 mcg total) by mouth daily.     No current facility-administered medications for this visit.      Past Medical History:  Diagnosis Date  . Anxiety   . Arthritis   . Back pain, chronic    Sciatica  . Cataracts, bilateral   . Depression   . Dysrhythmia    AFib  . Essential hypertension   . GERD (gastroesophageal reflux disease)   . Hyperlipidemia   . Hypothyroidism   . Membranous nephropathy determined by biopsy 06/07/2015  . Nephrotic syndrome    RHUX  . Neuropathy   . Osteopenia   .  Pacemaker    Medtronic  . PAF (paroxysmal atrial fibrillation) ([place])   . Pre-diabetes   . Tachycardia-bradycardia syndrome ([place])     ROS:   All systems reviewed and negative except as noted in the HPI.   Past Surgical History:  Procedure Laterality Date  . ABDOMINAL HYSTERECTOMY     partial-pt has no ovaries  . ANTERIOR VITRECTOMY Right 03/11/2018   Procedure: ANTERIOR VITRECTOMY;  Surgeon: Baruch Goldmann, MD;  Location: [facility];  Service: Ophthalmology;  Laterality: Right;  . APPENDECTOMY    . BACK SURGERY    . BIOPSY N/A 01/17/2014   Procedure: BIOPSY;  Surgeon: Rogene Houston, MD;  Location: [facility];  Service: Endoscopy;  Laterality: N/A;  . CATARACT EXTRACTION W/PHACO Left 02/28/2015   Procedure: CATARACT EXTRACTION PHACO AND INTRAOCULAR LENS PLACEMENT (IOC);  Surgeon: [name], MD;  Location: [facility];  Service: Ophthalmology;  Laterality: Left;  CDE 18.71  . CATARACT EXTRACTION W/PHACO Right 03/11/2018   Procedure: CATARACT EXTRACTION PHACO  AND INTRAOCULAR LENS PLACEMENT RIGHT EYE ;  Surgeon: Baruch Goldmann, MD;  Location: [facility];  Service: Ophthalmology;  Laterality: Right;  CDE: 19.39  . COLONOSCOPY  9/05  . COLONOSCOPY N/A 10/11/2013   Procedure: COLONOSCOPY;  Surgeon: Rogene Houston, MD;  [facility];  Service: Endoscopy;  Laterality: N/A;  1200  . ESOPHAGOGASTRODUODENOSCOPY N/A 01/17/2014   Procedure: ESOPHAGOGASTRODUODENOSCOPY (EGD);  Surgeon: Rogene Houston, MD;  [facility];  Service: Endoscopy;  Laterality: N/A;  230  . ESOPHAGOGASTRODUODENOSCOPY N/A 01/02/2016   Procedure: ESOPHAGOGASTRODUODENOSCOPY (EGD);  Surgeon: Rogene Houston, MD;  [facility];  Service: Endoscopy;  Laterality: N/A;  240  . INSERT / REPLACE / REMOVE PACEMAKER     2012  . Wisdom tooth extracton       Family History  Problem Relation Age of Onset  . Colon cancer Brother   . Anxiety disorder Sister   . Depression Sister   . Anxiety disorder Sister   .  Depression Sister      Social History   Socioeconomic History  . Marital status: Divorced    Spouse name: Not on file  . Number of children: Not on file  . Years of education: Not on file  . Highest education level: Not on file  Occupational History  . Occupation: retired  Scientific laboratory technician  . Financial resource strain: Not on file  . Food insecurity:    Worry: Not on file    Inability: Not on file  . Transportation needs:    Medical: Not on file    Non-medical: Not on file  Tobacco Use  . Smoking status: Never Smoker  . Smokeless tobacco: Never Used  Substance and  Sexual Activity  . Alcohol use: No    Alcohol/week: 0.0 oz  . Drug use: No  . Sexual activity: Not Currently    Birth control/protection: None  Lifestyle  . Physical activity:    Days per week: Not on file    Minutes per session: Not on file  . Stress: Not on file  Relationships  . Social connections:    Talks on phone: Not on file    Gets together: Not on file    Attends religious service: Not on file    Active member of club or organization: Not on file    Attends meetings of clubs or organizations: Not on file    Relationship status: Not on file  . Intimate partner violence:    Fear of current or ex partner: Not on file    Emotionally abused: Not on file    Physically abused: Not on file    Forced sexual activity: Not on file  Other Topics Concern  . Not on file  Social History Narrative  . Not on file     BP (!) 180/94 (BP Location: Right Arm)   Pulse 92   Ht 5\' 9"  (1.753 m)   Wt 158 lb (71.7 kg)   SpO2 90%   BMI 23.33 kg/m   Physical Exam:  Well appearing 84 yo woman, lots of sun damage to her skin, NAD HEENT: Unremarkable Neck:  6 cm JVD, no thyromegally Lymphatics:  No adenopathy Back:  No CVA tenderness Lungs:  Clear with no wheezes HEART:  Regular rate rhythm, no murmurs, no rubs, no clicks Abd:  soft, positive bowel sounds, no organomegally, no rebound, no guarding Ext:  2 plus  pulses, no edema, no cyanosis, no clubbing Skin:  No rashes no nodules Neuro:  CN II through XII intact, motor grossly intact  EKG - none  DEVICE  Normal device function.  See PaceArt for details.   Assess/Plan: 1. Atrial fib - she is always in atrial fib. Her rates are controlled. 2. PPM - her medtronic DDD PM is programmed VVIR and her device is working normally. We will recheck in several months. 3. HTN - her blood pressure is high and I have asked that she increase her amlodipine to 5 mg daily.  Mikle Bosworth.D.

## 2018-04-26 NOTE — Patient Instructions (Signed)
Medication Instructions:  Your physician recommends that you continue on your current medications as directed. Please refer to the Current Medication list given to you today.   Labwork: NONE   Testing/Procedures: NONE   Follow-Up: Your physician wants you to follow-up in: 1 Year with Dr. Taylor. You will receive a reminder letter in the mail two months in advance. If you don't receive a letter, please call our office to schedule the follow-up appointment.   Any Other Special Instructions Will Be Listed Below (If Applicable).     If you need a refill on your cardiac medications before your next appointment, please call your pharmacy.  Thank you for choosing  [facility]!   

## 2018-05-13 DIAGNOSIS — R0609 Other forms of dyspnea: Secondary | ICD-10-CM | POA: Diagnosis not present

## 2018-05-13 DIAGNOSIS — R748 Abnormal levels of other serum enzymes: Secondary | ICD-10-CM | POA: Diagnosis not present

## 2018-05-14 LAB — HEPATIC FUNCTION PANEL
ALBUMIN: 4 g/dL (ref 3.5–4.7)
ALT: 11 IU/L (ref 0–32)
AST: 20 IU/L (ref 0–40)
Alkaline Phosphatase: 94 IU/L (ref 39–117)
BILIRUBIN, DIRECT: 0.08 mg/dL (ref 0.00–0.40)
Bilirubin Total: 0.2 mg/dL (ref 0.0–1.2)
TOTAL PROTEIN: 6.3 g/dL (ref 6.0–8.5)

## 2018-05-14 LAB — ANA: ANA: NEGATIVE

## 2018-05-14 LAB — FERRITIN: FERRITIN: 39 ng/mL (ref 15–150)

## 2018-05-14 LAB — HEPATITIS C ANTIBODY: Hep C Virus Ab: <0.1 s/co ratio (ref 0.0–0.9)

## 2018-05-14 LAB — HEPATITIS B SURFACE ANTIGEN: HEP B S AG: NEGATIVE

## 2018-05-17 ENCOUNTER — Encounter: Payer: Self-pay | Admitting: Family Medicine

## 2018-05-17 ENCOUNTER — Ambulatory Visit (INDEPENDENT_AMBULATORY_CARE_PROVIDER_SITE_OTHER): Payer: PPO | Admitting: Family Medicine

## 2018-05-17 VITALS — BP 158/90 | Ht 69.0 in | Wt 159.8 lb

## 2018-05-17 DIAGNOSIS — E038 Other specified hypothyroidism: Secondary | ICD-10-CM

## 2018-05-17 DIAGNOSIS — I480 Paroxysmal atrial fibrillation: Secondary | ICD-10-CM | POA: Diagnosis not present

## 2018-05-17 DIAGNOSIS — N183 Chronic kidney disease, stage 3 unspecified: Secondary | ICD-10-CM

## 2018-05-17 MED ORDER — HYDROCODONE-ACETAMINOPHEN 10-325 MG PO TABS: ORAL_TABLET | ORAL | 0 refills | Status: DC

## 2018-05-17 NOTE — Progress Notes (Signed)
Subjective:    Patient ID: Kelsey Sandoval, female    DOB: 1933-01-12, 84 y.o.   MRN: [id number]  HPI This patient was seen today for chronic pain Patient recently stressed because of had a cataract surgery that would need some revision still having some visual difficulties Her other health issues are stable she takes her medicine on a regular basis  15 minutes was spent with patient today discussing healthcare issues which they came.  More than 50% of this visit-total duration of visit-was spent in counseling and coordination of care.  Please see diagnosis regarding the focus of this coordination and care  The medication list was reviewed and updated.   -Compliance with medication: yes  - Number patient states they take daily: 4-6 depends on how bad pt is hurting  -when was the last dose patient took? About 10:30 this morning  The patient was advised the importance of maintaining medication and not using illegal substances with these.  Here for refills and follow up  The patient was educated that we can provide 3 monthly scripts for their medication, it is their responsibility to follow the instructions.  Side effects or complications from medications: none  Patient is aware that pain medications are meant to minimize the severity of the pain to allow their pain levels to improve to allow for better function. They are aware of that pain medications cannot totally remove their pain.  Due for UDT ( at least once per year) : last UDT 11/18/17  Pt states she is concerned of why she begins to sweat. Pt states she can be getting ready in a house with air and she will break out into a sweat. This has been going on for a while. Pt states that she had a cataract removed from eye and has a rough time with that. Something went wrong with the surgery. Been under a lot of pressure with that.      Review of Systems  Constitutional: Negative for activity change, appetite change and fatigue.    HENT: Negative for congestion and rhinorrhea.   Respiratory: Negative for cough and shortness of breath.   Cardiovascular: Negative for chest pain and leg swelling.  Gastrointestinal: Negative for abdominal pain and diarrhea.  Endocrine: Negative for polydipsia and polyphagia.  Skin: Negative for color change.  Neurological: Negative for dizziness and weakness.  Psychiatric/Behavioral: Negative for behavioral problems and confusion.       Objective:   Physical Exam  Constitutional: She appears well-nourished. No distress.  HENT:  Head: Normocephalic and atraumatic.  Eyes: Right eye exhibits no discharge. Left eye exhibits no discharge.  Neck: No tracheal deviation present.  Cardiovascular: Normal rate, regular rhythm and normal heart sounds.  No murmur heard. Pulmonary/Chest: Effort normal and breath sounds normal. No respiratory distress.  Musculoskeletal: She exhibits no edema.  Lymphadenopathy:    She has no cervical adenopathy.  Neurological: She is alert. Coordination normal.  Skin: Skin is warm and dry.  Psychiatric: She has a normal mood and affect. Her behavior is normal.  Vitals reviewed.     Stable thyroid condition takes her medication Stable kidney condition keeps her blood pressure under good control eats healthy     Assessment & Plan:  The patient was seen in followup for chronic pain. A review over at their current pain status was discussed. Drug registry was checked. Prescriptions were given. Discussion was held regarding the importance of compliance with medication as well as pain medication contract.  Time  for questions regarding pain management plan occurred. Importance of regular followup visits was discussed. Patient was informed that medication may cause drowsiness and should not be combined  with other medications/alcohol or street drugs. Patient was cautioned that medication could cause drowsiness. If the patient feels medication is causing  altered alertness then do not drive or operate dangerous equipment.  Drug registry was checked  Lab work later this year follow-up in 3 months

## 2018-05-18 ENCOUNTER — Encounter: Payer: PPO | Admitting: *Deleted

## 2018-05-18 ENCOUNTER — Telehealth: Payer: Self-pay | Admitting: Cardiology

## 2018-05-18 NOTE — Telephone Encounter (Signed)
LMOVM reminding pt to send remote transmission.   

## 2018-05-19 ENCOUNTER — Encounter: Payer: Self-pay | Admitting: Cardiology

## 2018-05-20 ENCOUNTER — Other Ambulatory Visit: Payer: Self-pay | Admitting: Family Medicine

## 2018-05-20 ENCOUNTER — Other Ambulatory Visit: Payer: Self-pay

## 2018-05-20 MED ORDER — PANTOPRAZOLE SODIUM 40 MG PO TBEC: 40.0000 mg | DELAYED_RELEASE_TABLET | Freq: Every day | ORAL | 1 refills | Status: DC

## 2018-05-26 DIAGNOSIS — H59031 Cystoid macular edema following cataract surgery, right eye: Secondary | ICD-10-CM | POA: Diagnosis not present

## 2018-05-27 ENCOUNTER — Other Ambulatory Visit (HOSPITAL_COMMUNITY): Payer: Self-pay | Admitting: Psychiatry

## 2018-05-30 ENCOUNTER — Other Ambulatory Visit: Payer: Self-pay

## 2018-05-30 ENCOUNTER — Other Ambulatory Visit: Payer: Self-pay | Admitting: Family Medicine

## 2018-05-30 DIAGNOSIS — I480 Paroxysmal atrial fibrillation: Secondary | ICD-10-CM

## 2018-05-30 MED ORDER — LISINOPRIL 10 MG PO TABS: 10.0000 mg | ORAL_TABLET | Freq: Every day | ORAL | 1 refills | Status: DC

## 2018-05-31 ENCOUNTER — Other Ambulatory Visit: Payer: Self-pay | Admitting: *Deleted

## 2018-06-07 ENCOUNTER — Other Ambulatory Visit: Payer: Self-pay | Admitting: *Deleted

## 2018-06-07 DIAGNOSIS — I480 Paroxysmal atrial fibrillation: Secondary | ICD-10-CM

## 2018-06-07 MED ORDER — METOPROLOL SUCCINATE ER 50 MG PO TB24: ORAL_TABLET | ORAL | 0 refills | Status: DC

## 2018-06-08 ENCOUNTER — Ambulatory Visit (HOSPITAL_COMMUNITY): Payer: Self-pay | Admitting: Psychiatry

## 2018-06-09 ENCOUNTER — Ambulatory Visit (INDEPENDENT_AMBULATORY_CARE_PROVIDER_SITE_OTHER): Payer: PPO | Admitting: Psychiatry

## 2018-06-09 ENCOUNTER — Encounter (HOSPITAL_COMMUNITY): Payer: Self-pay | Admitting: Psychiatry

## 2018-06-09 VITALS — BP 152/86 | HR 77 | Ht 69.0 in | Wt 163.0 lb

## 2018-06-09 DIAGNOSIS — F322 Major depressive disorder, single episode, severe without psychotic features: Secondary | ICD-10-CM

## 2018-06-09 MED ORDER — ALPRAZOLAM 0.5 MG PO TABS: 0.5000 mg | ORAL_TABLET | Freq: Every day | ORAL | 2 refills | Status: DC | PRN

## 2018-06-09 MED ORDER — BUSPIRONE HCL 10 MG PO TABS: 10.0000 mg | ORAL_TABLET | Freq: Three times a day (TID) | ORAL | 2 refills | Status: DC

## 2018-06-09 MED ORDER — DULOXETINE HCL 60 MG PO CPEP: 60.0000 mg | ORAL_CAPSULE | Freq: Two times a day (BID) | ORAL | 2 refills | Status: DC

## 2018-06-09 MED ORDER — MIRTAZAPINE 15 MG PO TABS: 15.0000 mg | ORAL_TABLET | Freq: Every day | ORAL | 2 refills | Status: DC

## 2018-06-09 NOTE — Progress Notes (Signed)
[facility] MD/PA/NP OP Progress Note  06/09/2018 1:34 PM Kelsey Sandoval  MRN:  [id number]  Chief Complaint:  Chief Complaint    Depression; Anxiety; Follow-up     HPI: This patient is an 84 year old divorced white female who lives alone in [place].  She has 2 children and several grandchildren and great-grandchildren.  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  The patient returns after 6 months.  She has missed some appointments due to recurrent cataract surgeries.  She is still having trouble seeing out of her right eye.  She is having a lot of chronic back and leg and knee pain as well.  She is walking with a cane.  Nevertheless her spirits seem to be pretty good.  She still has lots of dreams about her boyfriend who died.  The Remeron is helping a little bit with her sleep.  Xanax continues to help her anxiety.  She denies suicidal ideation and seems to "keep going."  He is eating better with the mirtazapine Visit Diagnosis:    ICD-10-CM   1. Severe single current episode of major depressive disorder, without psychotic features ([place]) F32.2     Past Psychiatric History: none  Past Medical History:  Past Medical History:  Diagnosis Date  . Anxiety   . Arthritis   . Back pain, chronic    Sciatica  . Cataracts, bilateral   . Depression   . Dysrhythmia    AFib  . Essential hypertension   . GERD (gastroesophageal reflux disease)   . Hyperlipidemia   . Hypothyroidism   . Membranous nephropathy determined by biopsy 06/07/2015  . Nephrotic syndrome    RHUX  . Neuropathy   . Osteopenia   . Pacemaker    Medtronic  . PAF (paroxysmal atrial fibrillation) ([place])   . Pre-diabetes   . Tachycardia-bradycardia syndrome [facility])     Past Surgical History:  Procedure Laterality Date  . ABDOMINAL HYSTERECTOMY     partial-pt has no ovaries  . ANTERIOR VITRECTOMY Right 03/11/2018   Procedure: ANTERIOR VITRECTOMY;  Surgeon: Baruch Goldmann, MD;  Location: [facility];   Service: Ophthalmology;  Laterality: Right;  . APPENDECTOMY    . BACK SURGERY    . BIOPSY N/A 01/17/2014   Procedure: BIOPSY;  Surgeon: Rogene Houston, MD;  [facility];  Service: Endoscopy;  Laterality: N/A;  . CATARACT EXTRACTION W/PHACO Left 02/28/2015   Procedure: CATARACT EXTRACTION PHACO AND INTRAOCULAR LENS PLACEMENT (IOC);  Surgeon: [name], MD;  Location: [facility];  Service: Ophthalmology;  Laterality: Left;  CDE 18.71  . CATARACT EXTRACTION W/PHACO Right 03/11/2018   Procedure: CATARACT EXTRACTION PHACO  AND INTRAOCULAR LENS PLACEMENT RIGHT EYE ;  Surgeon: Baruch Goldmann, MD;  Location: [facility];  Service: Ophthalmology;  Laterality: Right;  CDE: 19.39  . COLONOSCOPY  9/05  . COLONOSCOPY N/A 10/11/2013   Procedure: COLONOSCOPY;  Surgeon: Rogene Houston, MD;  [facility];  Service: Endoscopy;  Laterality: N/A;  1200  . ESOPHAGOGASTRODUODENOSCOPY N/A 01/17/2014   Procedure: ESOPHAGOGASTRODUODENOSCOPY (EGD);  Surgeon: Rogene Houston, MD;  [facility];  Service: Endoscopy;  Laterality: N/A;  230  . ESOPHAGOGASTRODUODENOSCOPY N/A 01/02/2016   Procedure: ESOPHAGOGASTRODUODENOSCOPY (EGD);  Surgeon: Rogene Houston, MD;  [facility];  Service: Endoscopy;  Laterality: N/A;  240  . INSERT / REPLACE / REMOVE PACEMAKER     2012  . Wisdom tooth extracton      Family  Psychiatric History: See below  Family History:  Family History  Problem Relation Age of Onset  . Colon cancer Brother   . Anxiety disorder Sister   . Depression Sister   . Anxiety disorder Sister   . Depression Sister     Social History:  Social History   Socioeconomic History  . Marital status: Divorced    Spouse name: Not on file  . Number of children: Not on file  . Years of education: Not on file  . Highest education level: Not on file  Occupational History  . Occupation: retired  Scientific laboratory technician  . Financial resource strain: Not on file  . Food insecurity:    Worry:  Not on file    Inability: Not on file  . Transportation needs:    Medical: Not on file    Non-medical: Not on file  Tobacco Use  . Smoking status: Never Smoker  . Smokeless tobacco: Never Used  Substance and Sexual Activity  . Alcohol use: No    Alcohol/week: 0.0 standard drinks  . Drug use: No  . Sexual activity: Not Currently    Birth control/protection: None  Lifestyle  . Physical activity:    Days per week: Not on file    Minutes per session: Not on file  . Stress: Not on file  Relationships  . Social connections:    Talks on phone: Not on file    Gets together: Not on file    Attends religious service: Not on file    Active member of club or organization: Not on file    Attends meetings of clubs or organizations: Not on file    Relationship status: Not on file  Other Topics Concern  . Not on file  Social History Narrative  . Not on file    Allergies:  Allergies  Allergen Reactions  . Penicillins Other (See Comments)    Caused patient to pass out.Can take cephalosporins Has patient had a PCN reaction causing immediate rash, facial/tongue/throat swelling, SOB or lightheadedness with hypotension: no Has patient had a PCN reaction causing severe rash involving mucus membranes or skin necrosis: No Has patient had a PCN reaction that required hospitalization No Has patient had a PCN reaction occurring within the last 10 years: No If all of the above answers are "NO", then may proceed with Cephalosporin use.   . Levaquin [Levofloxacin] Nausea Only  . Sulfa Antibiotics Other (See Comments)    Unknown  . Zithromax [Azithromycin] Nausea Only and Rash    Metabolic Disorder Labs: No results found for: HGBA1C, MPG No results found for: PROLACTIN Lab Results  Component Value Date   CHOL 187 11/25/2015   TRIG 209 (H) 11/25/2015   HDL 58 11/25/2015   CHOLHDL 3.2 11/25/2015   VLDL 47 (H) 10/08/2014   LDLCALC 87 11/25/2015   LDLCALC 58 10/08/2014   Lab Results   Component Value Date   TSH 3.140 02/16/2018   TSH 3.030 08/16/2017    Therapeutic Level Labs: No results found for: LITHIUM No results found for: VALPROATE No components found for:  CBMZ  Current Medications: Current Outpatient Medications  Medication Sig Dispense Refill  . acetaminophen (TYLENOL) 650 MG CR tablet Take 1,300 mg by mouth every 8 (eight) hours as needed for pain.    Marland Kitchen albuterol (PROVENTIL HFA;VENTOLIN HFA) 108 (90 Base) MCG/ACT inhaler Inhale 2 puffs into the lungs every 6 (six) hours as needed for wheezing or shortness of breath. 18 g 2  . ALPRAZolam (  XANAX) 0.5 MG tablet Take 1 tablet (0.5 mg total) by mouth daily as needed for anxiety. Take one qhs 30 tablet 2  . amLODipine (NORVASC) 5 MG tablet Take 1 tablet (5 mg total) by mouth daily. 90 tablet 3  . busPIRone (BUSPAR) 10 MG tablet Take 1 tablet (10 mg total) by mouth 3 (three) times daily. 270 tablet 2  . DULoxetine (CYMBALTA) 60 MG capsule Take 1 capsule (60 mg total) by mouth 2 (two) times daily. 180 capsule 2  . gabapentin (NEURONTIN) 300 MG capsule TAKE 1 CAPSULE BY MOUTH IN THE MORNING, 1 CAPSULE IN THE AFTERNOON AND 2 CAPSULES IN THE EVENING. 120 capsule 5  . hydrALAZINE (APRESOLINE) 25 MG tablet TAKE 1 TABLET BY MOUTH TWICE DAILY 60 tablet 5  . HYDROcodone-acetaminophen (NORCO) 10-325 MG tablet Take one tablet every 4 hours prn pain. Max 6 tablets per day 180 tablet 0  . hydrOXYzine (ATARAX/VISTARIL) 10 MG tablet One bid prn nausea, caution drowsiness (Patient taking differently: Take 10 mg by mouth 2 (two) times daily as needed for nausea. ) 40 tablet 2  . levothyroxine (SYNTHROID, LEVOTHROID) 50 MCG tablet TAKE 1 TABLET BY MOUTH ONCE DAILY (CHANGE  IN  DOSE) 90 tablet 1  . lisinopril (PRINIVIL,ZESTRIL) 10 MG tablet Take 1 tablet (10 mg total) by mouth daily. 90 tablet 1  . metoprolol succinate (TOPROL-XL) 50 MG 24 hr tablet TAKE 1 TABLET BY MOUTH TWICE DAILY. TAKE WITH OR IMMEDIATELY FOLLOWING A MEAL. 180  tablet 0  . mirtazapine (REMERON) 15 MG tablet Take 1 tablet (15 mg total) by mouth at bedtime. 90 tablet 2  . ondansetron (ZOFRAN-ODT) 8 MG disintegrating tablet Take 1 tablet (8 mg total) by mouth every 8 (eight) hours as needed for nausea or vomiting. 30 tablet 5  . pantoprazole (PROTONIX) 40 MG tablet Take 1 tablet (40 mg total) by mouth daily. 90 tablet 1  . PRADAXA 150 MG CAPS capsule TAKE 1 CAPSULE BY MOUTH TWICE DAILY 180 capsule 1  . pravastatin (PRAVACHOL) 80 MG tablet TAKE 1 TABLET BY MOUTH ONCE DAILY 90 tablet 1  . vitamin B-12 (CYANOCOBALAMIN) 1000 MCG tablet Take 1 tablet (1,000 mcg total) by mouth daily.     No current facility-administered medications for this visit.      Musculoskeletal: Strength & Muscle Tone: decreased Gait & Station: unsteady Patient leans: N/A  Psychiatric Specialty Exam: Review of Systems  Eyes: Positive for blurred vision.  Musculoskeletal: Positive for back pain and joint pain.  All other systems reviewed and are negative.   Blood pressure (!) 152/86, pulse 77, height 5\' 9"  (1.753 m), weight 163 lb (73.9 kg), SpO2 96 %.Body mass index is 24.07 kg/m.  General Appearance: Casual, Neat and Well Groomed  Eye Contact:  Good  Speech:  Clear and Coherent  Volume:  Normal  Mood:  Euthymic  Affect:  Congruent  Thought Process:  Goal Directed  Orientation:  Full (Time, Place, and Person)  Thought Content: WDL   Suicidal Thoughts:  No  Homicidal Thoughts:  No  Memory:  Immediate;   Good Recent;   Good Remote;   Good  Judgement:  Fair  Insight:  Fair  Psychomotor Activity:  Decreased  Concentration:  Concentration: Fair and Attention Span: Fair  Recall:  Good  Fund of Knowledge: Fair  Language: Good  Akathisia:  No  Handed:  Right  AIMS (if indicated): not done  Assets:  Communication Skills Desire for Improvement Resilience Social Support Talents/Skills  ADL's:  Intact  Cognition: WNL  Sleep:  Fair   Screenings: PHQ2-9      Office Visit from 05/17/2018 in [place] Office Visit from 11/18/2017 in [place] Office Visit from 09/20/2017 in [place] Office Visit from 01/23/2016 in [facility] Office Visit from 07/19/2015 in [facility]  PHQ-2 Total Score  4  0  2  0  0  PHQ-9 Total Score  8  -  -  -  -       Assessment and Plan: This patient is a 84 year old female with a history of depression and anxiety.  For the most part she is doing well.  She will continue Cymbalta 60 mg twice daily for depression, BuSpar 10 mg 3 times daily for anxiety and Xanax 0.5 mg daily as needed for anxiety as well as mirtazapine 15 mg at bedtime for depression and sleep.  She will return to see me in 3 months   Levonne Spiller, MD 06/09/2018, 1:34 PM

## 2018-06-11 IMAGING — US US ABDOMEN LIMITED
1 series · 14 of 25 positions shown · non-contrast
Comparison: CT abdomen and pelvis March 07, 2015

CLINICAL DATA: Elevated liver enzymes

EXAM:
ULTRASOUND ABDOMEN LIMITED RIGHT UPPER QUADRANT

[Series 1: us abdomen limited · 0.18mm/px · 14 of 66 slices shown]
[im 1/66]
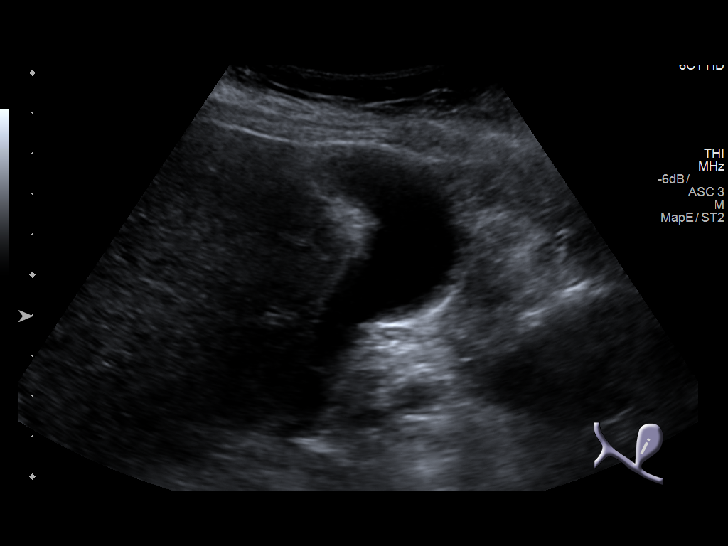
[im 6/66]
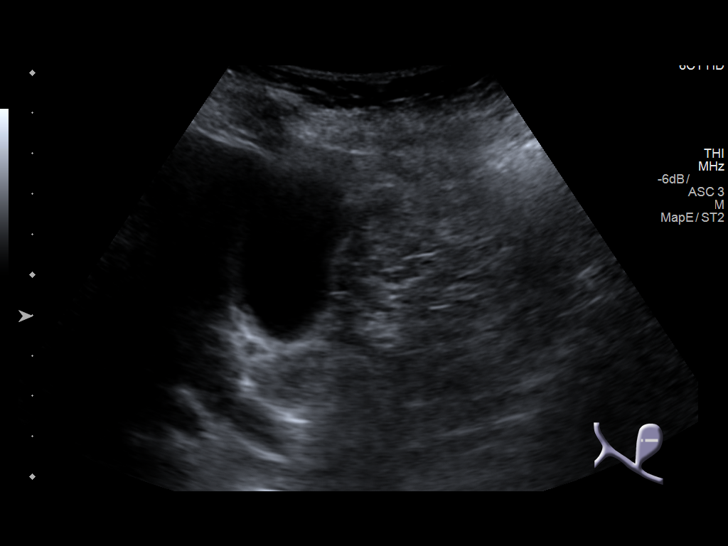
[im 11/66]
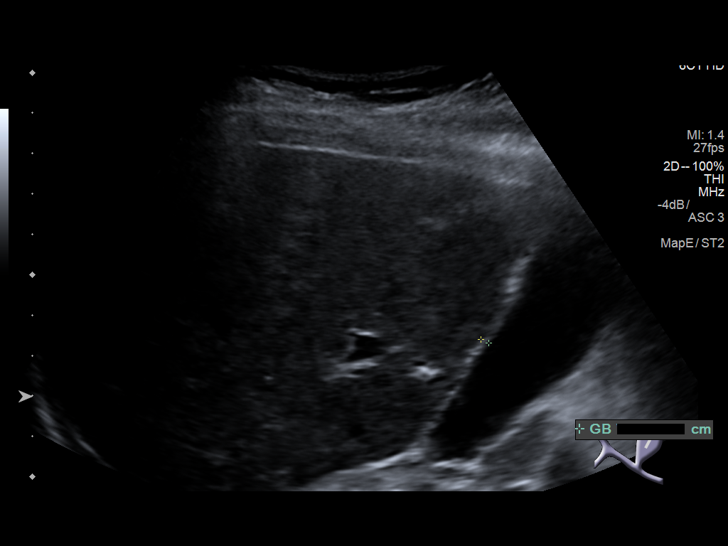
[im 17/66]
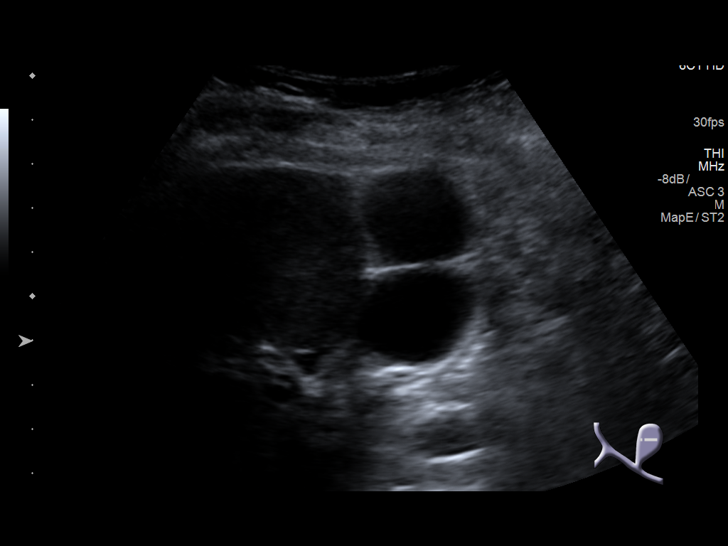
[im 22/66]
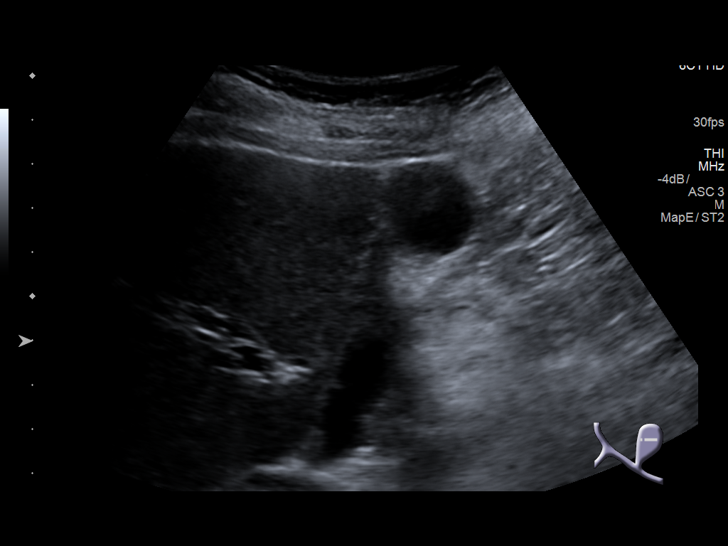
[im 25/66]
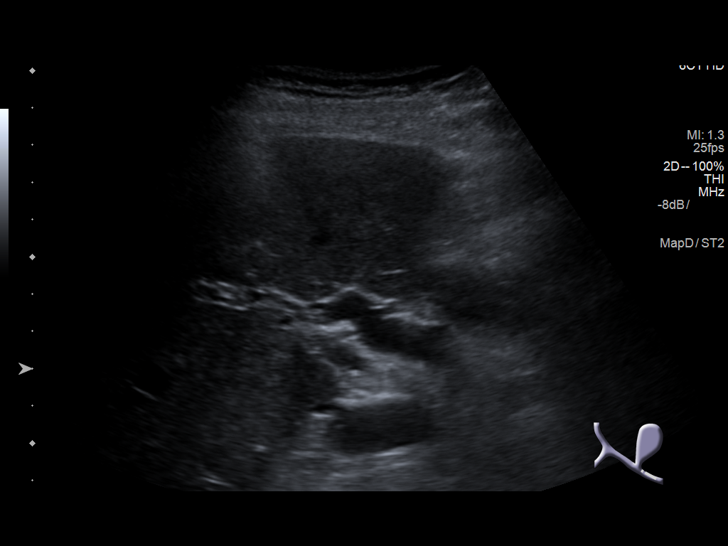
[im 30/66]
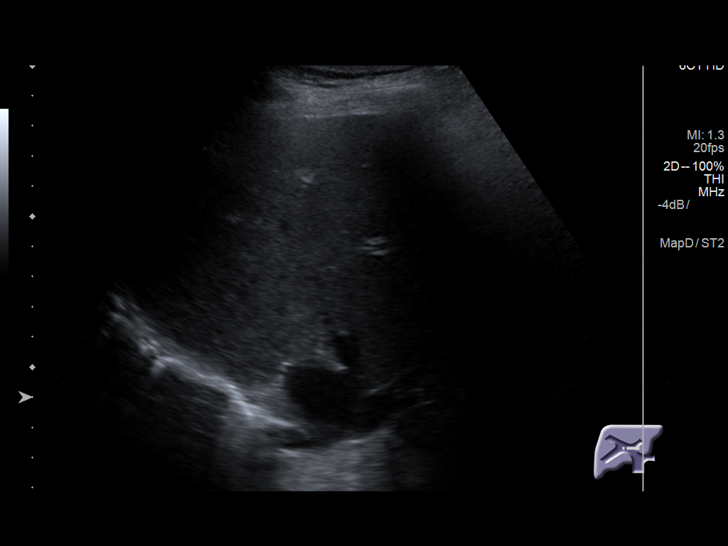
[im 36/66]
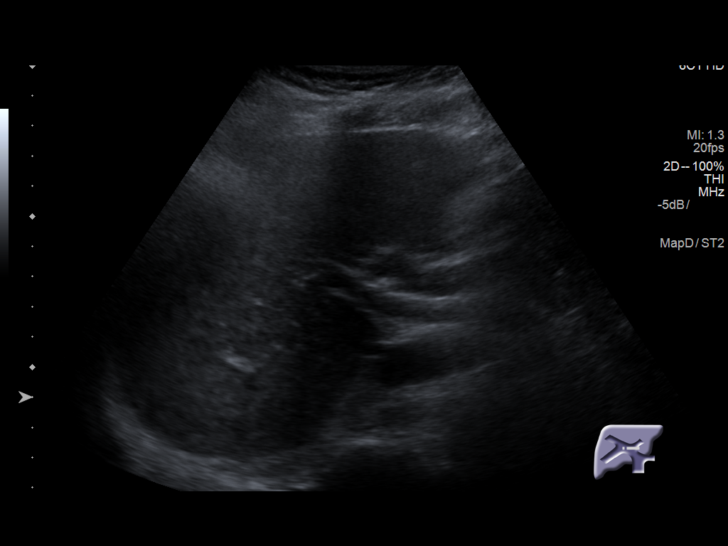
[im 41/66]
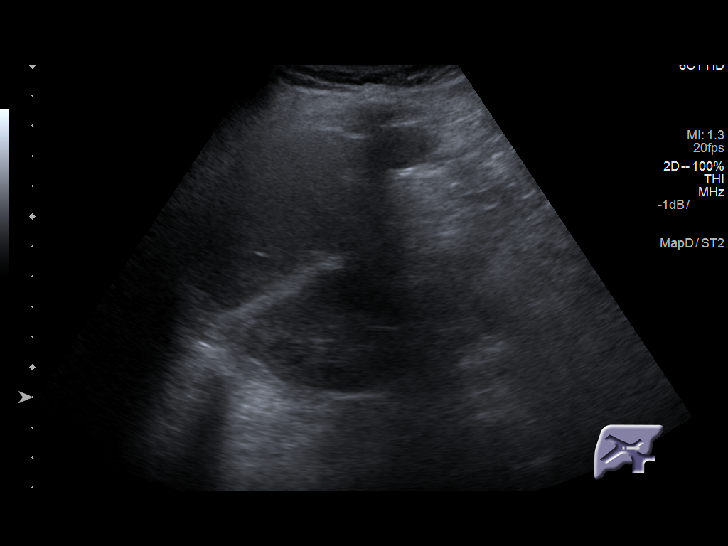
[im 44/66]
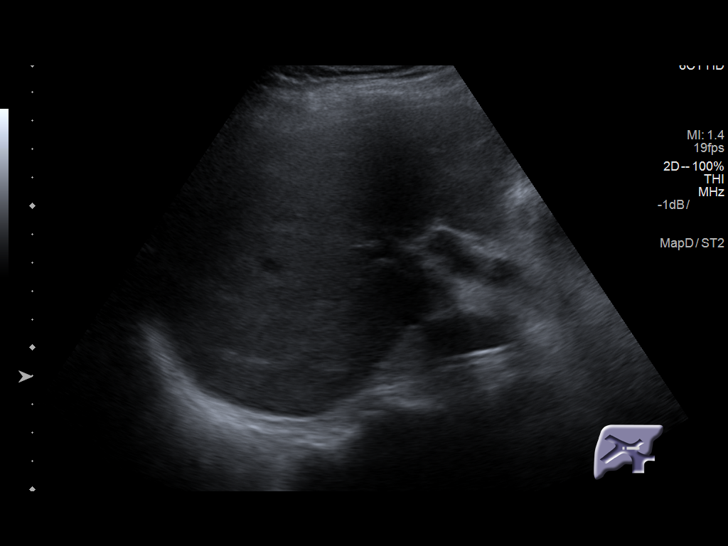
[im 49/66]
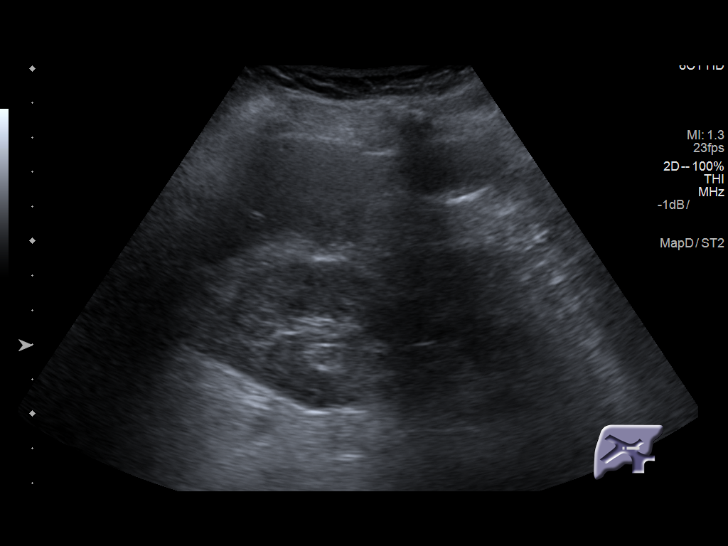
[im 55/66]
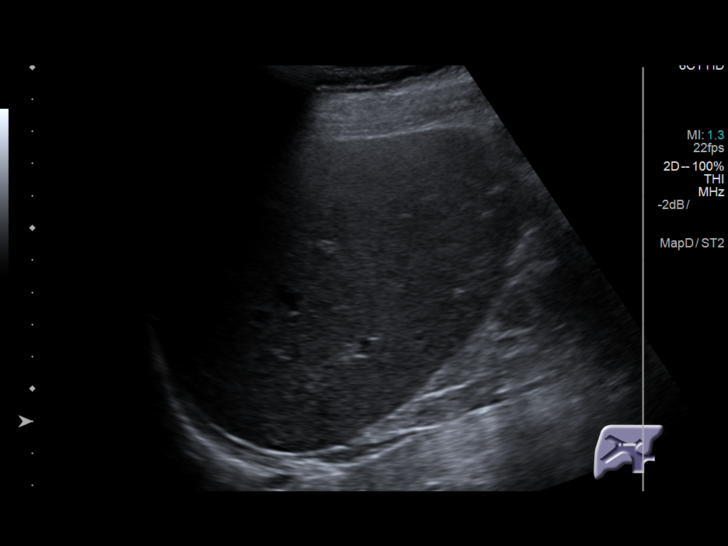
[im 60/66]
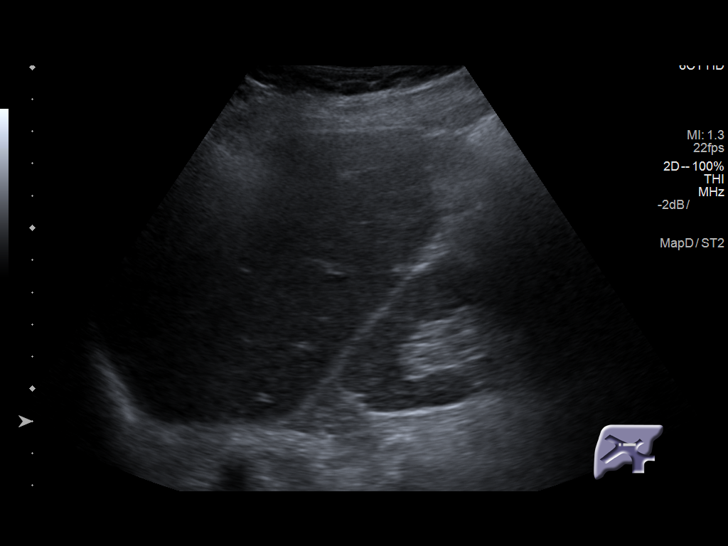
[im 66/66]
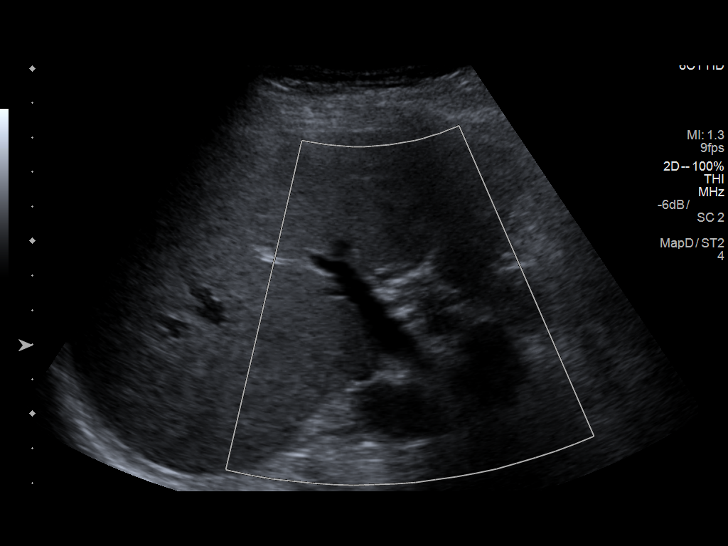

[14 of 25 positions shown; findings below may reference images not displayed]

FINDINGS: Gallbladder:

No gallstones or wall thickening visualized. There is no
pericholecystic fluid. No sonographic Murphy sign noted by
sonographer.

Common bile duct:

Diameter: 9 mm, prominent. No intrahepatic biliary duct dilatation
evident. No visualized biliary duct mass or calculus.

Liver:

No focal lesion identified. Within normal limits in parenchymal
echogenicity. Portal vein is patent on color Doppler imaging with
normal direction of blood flow towards the liver.
IMPRESSION: Prominent common bile duct without mass or calculus appreciable. No
gallbladder pathology evident. No liver lesions evident.

MRCP would be the imaging study of choice to further evaluate the
common bile duct if there remains concern for pathology in this
area.

## 2018-06-14 ENCOUNTER — Other Ambulatory Visit: Payer: Self-pay | Admitting: Family Medicine

## 2018-06-23 DIAGNOSIS — H35361 Drusen (degenerative) of macula, right eye: Secondary | ICD-10-CM | POA: Diagnosis not present

## 2018-06-23 DIAGNOSIS — H59031 Cystoid macular edema following cataract surgery, right eye: Secondary | ICD-10-CM | POA: Diagnosis not present

## 2018-06-23 DIAGNOSIS — H33191 Other retinoschisis and retinal cysts, right eye: Secondary | ICD-10-CM | POA: Diagnosis not present

## 2018-06-23 DIAGNOSIS — H43812 Vitreous degeneration, left eye: Secondary | ICD-10-CM | POA: Diagnosis not present

## 2018-06-24 DIAGNOSIS — M170 Bilateral primary osteoarthritis of knee: Secondary | ICD-10-CM | POA: Diagnosis not present

## 2018-06-30 DIAGNOSIS — M1990 Unspecified osteoarthritis, unspecified site: Secondary | ICD-10-CM | POA: Diagnosis not present

## 2018-07-04 ENCOUNTER — Other Ambulatory Visit: Payer: Self-pay | Admitting: Family Medicine

## 2018-07-25 ENCOUNTER — Other Ambulatory Visit: Payer: Self-pay | Admitting: Family Medicine

## 2018-07-31 ENCOUNTER — Emergency Department (HOSPITAL_COMMUNITY): Payer: PPO

## 2018-07-31 ENCOUNTER — Other Ambulatory Visit: Payer: Self-pay

## 2018-07-31 ENCOUNTER — Encounter (HOSPITAL_COMMUNITY): Payer: Self-pay

## 2018-07-31 ENCOUNTER — Observation Stay (HOSPITAL_COMMUNITY)
Admission: EM | Admit: 2018-07-31 | Discharge: 2018-08-01 | Disposition: A | Discharge status: Home / Self Care | Payer: PPO | Attending: Internal Medicine | Admitting: Internal Medicine

## 2018-07-31 DIAGNOSIS — I509 Heart failure, unspecified: Secondary | ICD-10-CM | POA: Diagnosis not present

## 2018-07-31 DIAGNOSIS — Z79899 Other long term (current) drug therapy: Secondary | ICD-10-CM | POA: Insufficient documentation

## 2018-07-31 DIAGNOSIS — I110 Hypertensive heart disease with heart failure: Secondary | ICD-10-CM | POA: Diagnosis not present

## 2018-07-31 DIAGNOSIS — N183 Chronic kidney disease, stage 3 (moderate): Secondary | ICD-10-CM | POA: Insufficient documentation

## 2018-07-31 DIAGNOSIS — I160 Hypertensive urgency: Secondary | ICD-10-CM | POA: Diagnosis not present

## 2018-07-31 DIAGNOSIS — E039 Hypothyroidism, unspecified: Secondary | ICD-10-CM | POA: Insufficient documentation

## 2018-07-31 DIAGNOSIS — R0602 Shortness of breath: Secondary | ICD-10-CM | POA: Diagnosis not present

## 2018-07-31 DIAGNOSIS — I700 Atherosclerosis of aorta: Secondary | ICD-10-CM | POA: Diagnosis not present

## 2018-07-31 DIAGNOSIS — I10 Essential (primary) hypertension: Secondary | ICD-10-CM | POA: Diagnosis not present

## 2018-07-31 DIAGNOSIS — J9811 Atelectasis: Secondary | ICD-10-CM | POA: Diagnosis not present

## 2018-07-31 LAB — CBC
HCT: 40.6 % (ref 36.0–46.0)
Hemoglobin: 12.4 g/dL (ref 12.0–15.0)
MCH: 29.2 pg (ref 26.0–34.0)
MCHC: 30.5 g/dL (ref 30.0–36.0)
MCV: 95.8 fL (ref 80.0–100.0)
Platelets: 234 K/uL (ref 150–400)
RBC: 4.24 MIL/uL (ref 3.87–5.11)
RDW: 13.4 % (ref 11.5–15.5)
WBC: 9 K/uL (ref 4.0–10.5)
nRBC: 0 % (ref 0.0–0.2)

## 2018-07-31 LAB — BASIC METABOLIC PANEL WITH GFR
Anion gap: 6 (ref 5–15)
BUN: 16 mg/dL (ref 8–23)
CO2: 27 mmol/L (ref 22–32)
Calcium: 9.2 mg/dL (ref 8.9–10.3)
Chloride: 109 mmol/L (ref 98–111)
Creatinine, Ser: 1 mg/dL (ref 0.44–1.00)
GFR calc Af Amer: 59 mL/min — ABNORMAL LOW
GFR calc non Af Amer: 51 mL/min — ABNORMAL LOW
Glucose, Bld: 96 mg/dL (ref 70–99)
Potassium: 4.6 mmol/L (ref 3.5–5.1)
Sodium: 142 mmol/L (ref 135–145)

## 2018-07-31 LAB — TROPONIN I
Troponin I: <0.03 ng/mL
Troponin I: <0.03 ng/mL (ref <0.03)

## 2018-07-31 LAB — BRAIN NATRIURETIC PEPTIDE: B Natriuretic Peptide: 597 pg/mL — ABNORMAL HIGH (ref 0.0–100.0)

## 2018-07-31 MED ORDER — SODIUM CHLORIDE 0.9% FLUSH: 3.0000 mL | Freq: Two times a day (BID) | INTRAVENOUS | Status: DC

## 2018-07-31 MED ORDER — ACETAMINOPHEN 650 MG RE SUPP: 650.0000 mg | Freq: Four times a day (QID) | RECTAL | Status: DC | PRN

## 2018-07-31 MED ORDER — LISINOPRIL 10 MG PO TABS: 20.0000 mg | ORAL_TABLET | Freq: Every day | ORAL | 1 refills | Status: DC

## 2018-07-31 MED ORDER — FUROSEMIDE 20 MG PO TABS: 20.0000 mg | ORAL_TABLET | Freq: Every day | ORAL | 0 refills | Status: DC

## 2018-07-31 MED ORDER — LISINOPRIL 10 MG PO TABS
10.0000 mg | ORAL_TABLET | ORAL | Status: AC
  Administered 2018-07-31: 10 mg via ORAL
  Filled 2018-07-31: qty 1

## 2018-07-31 MED ORDER — FUROSEMIDE 10 MG/ML IJ SOLN
40.0000 mg | Freq: Once | INTRAMUSCULAR | Status: AC
  Administered 2018-07-31: 40 mg via INTRAVENOUS
  Filled 2018-07-31: qty 4

## 2018-07-31 MED ORDER — ACETAMINOPHEN 325 MG PO TABS
650.0000 mg | ORAL_TABLET | Freq: Four times a day (QID) | ORAL | Status: DC | PRN
  Administered 2018-07-31 – 2018-08-01 (×2): 650 mg via ORAL
  Filled 2018-07-31 (×2): qty 2

## 2018-07-31 MED ORDER — AMLODIPINE BESYLATE 5 MG PO TABS
5.0000 mg | ORAL_TABLET | Freq: Once | ORAL | Status: AC
  Administered 2018-07-31: 5 mg via ORAL
  Filled 2018-07-31: qty 1

## 2018-07-31 MED ORDER — SODIUM CHLORIDE 0.9% FLUSH: 3.0000 mL | INTRAVENOUS | Status: DC | PRN

## 2018-07-31 MED ORDER — IOPAMIDOL (ISOVUE-370) INJECTION 76%
75.0000 mL | Freq: Once | INTRAVENOUS | Status: AC | PRN
  Administered 2018-07-31: 75 mL via INTRAVENOUS

## 2018-07-31 MED ORDER — NITROGLYCERIN 2 % TD OINT
1.0000 [in_us] | TOPICAL_OINTMENT | Freq: Four times a day (QID) | TRANSDERMAL | Status: DC
  Administered 2018-07-31 – 2018-08-01 (×2): 1 [in_us] via TOPICAL
  Filled 2018-07-31 (×2): qty 1

## 2018-07-31 MED ORDER — SODIUM CHLORIDE 0.9 % IV SOLN: 250.0000 mL | INTRAVENOUS | Status: DC | PRN

## 2018-07-31 MED ORDER — LISINOPRIL 10 MG PO TABS: 20.0000 mg | ORAL_TABLET | Freq: Every day | ORAL | Status: DC

## 2018-07-31 MED ORDER — HYDRALAZINE HCL 20 MG/ML IJ SOLN
10.0000 mg | Freq: Four times a day (QID) | INTRAMUSCULAR | Status: DC | PRN
  Administered 2018-07-31: 10 mg via INTRAVENOUS
  Filled 2018-07-31: qty 1

## 2018-07-31 MED ORDER — HYDROCODONE-ACETAMINOPHEN 10-325 MG PO TABS
1.0000 | ORAL_TABLET | ORAL | Status: DC | PRN
  Administered 2018-07-31 – 2018-08-01 (×4): 1 via ORAL
  Filled 2018-07-31 (×4): qty 1

## 2018-07-31 NOTE — ED Notes (Signed)
Spoke with pt and family- pt has decided to stay for hospital admission. Pt gowned. Dr Hillard Danker aware.

## 2018-07-31 NOTE — ED Triage Notes (Signed)
Pt c/o shortness of breath and chest pain that started 2 days prior.

## 2018-07-31 NOTE — H&P (Signed)
TRH H&P   Patient Demographics:    Kelsey Sandoval, is a 84 y.o. female  MRN: [id number]   DOB - 1933-05-05  Admit Date - 07/31/2018  Outpatient Primary MD for the patient is Kathyrn Drown, MD  Referring MD/NP/PA:  Dorie Rank  Outpatient Specialists:     Patient coming from: home  Chief Complaint  Patient presents with  . Shortness of Breath      HPI:    Kelsey Sandoval  is a 84 y.o. female, w hypertension, hyperlipidemia, hypothyroidism, Pafib, apparently c/o dyspnea for the past 2 days.  Pt notes that her bp medication decreased recently ? Pt denies fever, chills, cough, cp, palp, n/v, diarrhea, brbpr, black stool.   In ED,  T 97.9, P 79, Bp 167/83  Pox 95% on RA  CTA chest IMPRESSION: No evidence of pulmonary embolism.  Small right pleural effusion and tiny amount of left pleural fluid.  Mild cardiomegaly.  Mild atherosclerotic coronary artery disease.  Aortic Atherosclerosis (ICD10-I70.0).  Minimal mediastinal adenopathy likely reactive.  Na 142, K 4.6, Bun 16, Creatinine 1.00 Wbc 9.0, Hgb 12.4, Plt 234 Trop <0.03 BNP 597.0   Pt will be admitted for dyspnea secondary to hypertensive urgency.      Review of systems:    In addition to the HPI above, No Fever-chills, No Headache, No changes with Vision or hearing, No problems swallowing food or Liquids, No Chest pain, No Cough  No Abdominal pain, No Nausea or Vommitting, Bowel movements are regular, No Blood in stool or Urine, No dysuria, No new skin rashes or bruises, No new joints pains-aches,  No new weakness, tingling, numbness in any extremity, No recent weight gain or loss, No polyuria, polydypsia or polyphagia, No significant Mental Stressors.  A full 10 point Review of Systems was done, except as stated above, all other Review of Systems were negative.   With Past History of the  following :    Past Medical History:  Diagnosis Date  . Anxiety   . Arthritis   . Back pain, chronic    Sciatica  . Cataracts, bilateral   . Depression   . Dysrhythmia    AFib  . Essential hypertension   . GERD (gastroesophageal reflux disease)   . Hyperlipidemia   . Hypothyroidism   . Membranous nephropathy determined by biopsy 06/07/2015  . Nephrotic syndrome    RHUX  . Neuropathy   . Osteopenia   . Pacemaker    [employer]  . PAF (paroxysmal atrial fibrillation) ([place])   . Pre-diabetes   . Tachycardia-bradycardia syndrome [facility])       Past Surgical History:  Procedure Laterality Date  . ABDOMINAL HYSTERECTOMY     partial-pt has no ovaries  . ANTERIOR VITRECTOMY Right 03/11/2018   Procedure: ANTERIOR VITRECTOMY;  Surgeon: Baruch Goldmann, MD;  Location: [facility];  Service: Ophthalmology;  Laterality: Right;  .  APPENDECTOMY    . BACK SURGERY    . BIOPSY N/A 01/17/2014   Procedure: BIOPSY;  Surgeon: Rogene Houston, MD;  Location: [facility];  Service: Endoscopy;  Laterality: N/A;  . CATARACT EXTRACTION W/PHACO Left 02/28/2015   Procedure: CATARACT EXTRACTION PHACO AND INTRAOCULAR LENS PLACEMENT (IOC);  Surgeon: [name], MD;  Location: [facility];  Service: Ophthalmology;  Laterality: Left;  CDE 18.71  . CATARACT EXTRACTION W/PHACO Right 03/11/2018   Procedure: CATARACT EXTRACTION PHACO  AND INTRAOCULAR LENS PLACEMENT RIGHT EYE ;  Surgeon: Baruch Goldmann, MD;  Location: [facility];  Service: Ophthalmology;  Laterality: Right;  CDE: 19.39  . COLONOSCOPY  9/05  . COLONOSCOPY N/A 10/11/2013   Procedure: COLONOSCOPY;  Surgeon: Rogene Houston, MD;  Location: [facility];  Service: Endoscopy;  Laterality: N/A;  1200  . ESOPHAGOGASTRODUODENOSCOPY N/A 01/17/2014   Procedure: ESOPHAGOGASTRODUODENOSCOPY (EGD);  Surgeon: Rogene Houston, MD;  Location: [facility];  Service: Endoscopy;  Laterality: N/A;  230  . ESOPHAGOGASTRODUODENOSCOPY N/A 01/02/2016   Procedure: ESOPHAGOGASTRODUODENOSCOPY  (EGD);  Surgeon: Rogene Houston, MD;  Location: [facility];  Service: Endoscopy;  Laterality: N/A;  240  . INSERT / REPLACE / REMOVE PACEMAKER     2012  . Wisdom tooth extracton        Social History:     Social History   Tobacco Use  . Smoking status: Never Smoker  . Smokeless tobacco: Never Used  Substance Use Topics  . Alcohol use: No    Alcohol/week: 0.0 standard drinks     Lives - at home  Mobility - walks by self   Family History :     Family History  Problem Relation Age of Onset  . Colon cancer Brother   . Anxiety disorder Sister   . Depression Sister   . Anxiety disorder Sister   . Depression Sister        Home Medications:   Prior to Admission medications   Medication Sig Start Date End Date Taking? Authorizing Provider  acetaminophen (TYLENOL) 650 MG CR tablet Take 1,300 mg by mouth every 8 (eight) hours as needed for pain.   Yes [provider]  albuterol (PROVENTIL HFA;VENTOLIN HFA) 108 (90 Base) MCG/ACT inhaler Inhale 2 puffs into the lungs every 6 (six) hours as needed for wheezing or shortness of breath. 02/17/18  Yes Kathyrn Drown, MD  ALPRAZolam Duanne Moron) 0.5 MG tablet Take 1 tablet (0.5 mg total) by mouth daily as needed for anxiety. Take one qhs Patient taking differently: Take 0.5 mg by mouth at bedtime. Take one qhs 06/09/18  Yes Cloria Spring, MD  amLODipine (NORVASC) 5 MG tablet Take 1 tablet (5 mg total) by mouth daily. Patient taking differently: Take 2.5 mg by mouth every morning.  04/26/18 07/31/18 Yes Evans Lance, MD  busPIRone (BUSPAR) 10 MG tablet Take 1 tablet (10 mg total) by mouth 3 (three) times daily. 06/09/18  Yes Cloria Spring, MD  DULoxetine (CYMBALTA) 60 MG capsule Take 1 capsule (60 mg total) by mouth 2 (two) times daily. 06/09/18  Yes Cloria Spring, MD  gabapentin (NEURONTIN) 300 MG capsule TAKE 1 CAPSULE BY MOUTH IN THE MORNING, 1 CAPSULE IN THE AFTERNOON, AND 2 CAPSULES IN THE EVENING Patient taking  differently: Take 300 mg by mouth 3 (three) times daily.  07/05/18  Yes Kathyrn Drown, MD  hydrALAZINE (APRESOLINE) 25 MG tablet TAKE 1 TABLET BY MOUTH TWICE DAILY Patient taking differently: Take  25 mg by mouth 2 (two) times daily.  03/07/18  Yes Kathyrn Drown, MD  HYDROcodone-acetaminophen (NORCO) 10-325 MG tablet Take one tablet every 4 hours prn pain. Max 6 tablets per day 05/17/18  Yes Luking, Elayne Snare, MD  hydrOXYzine (ATARAX/VISTARIL) 10 MG tablet One bid prn nausea, caution drowsiness Patient taking differently: Take 10 mg by mouth 2 (two) times daily as needed for nausea or vomiting.  01/19/17  Yes Luking, Elayne Snare, MD  levothyroxine (SYNTHROID, LEVOTHROID) 50 MCG tablet TAKE 1 TABLET BY MOUTH ONCE DAILY Patient taking differently: Take 50 mcg by mouth daily before breakfast.  07/26/18  Yes Luking, Scott A, MD  metoprolol succinate (TOPROL-XL) 50 MG 24 hr tablet TAKE 1 TABLET BY MOUTH TWICE DAILY. TAKE WITH OR IMMEDIATELY FOLLOWING A MEAL. Patient taking differently: Take 50 mg by mouth 2 (two) times daily. TAKE WITH OR IMMEDIATELY FOLLOWING A MEAL. 06/07/18  Yes Luking, Elayne Snare, MD  mirtazapine (REMERON) 15 MG tablet Take 1 tablet (15 mg total) by mouth at bedtime. Patient taking differently: Take 7.5-15 mg by mouth at bedtime as needed (FOR SLEEP).  06/09/18  Yes Cloria Spring, MD  pantoprazole (PROTONIX) 40 MG tablet Take 1 tablet (40 mg total) by mouth daily. Patient taking differently: Take 40 mg by mouth every morning.  05/20/18  Yes Luking, Elayne Snare, MD  PRADAXA 150 MG CAPS capsule TAKE 1 CAPSULE BY MOUTH TWICE DAILY Patient taking differently: Take 150 mg by mouth 2 (two) times daily.  02/21/18  Yes Evans Lance, MD  pravastatin (PRAVACHOL) 80 MG tablet TAKE 1 TABLET BY MOUTH ONCE DAILY Patient taking differently: Take 80 mg by mouth every evening.  05/30/18  Yes Luking, Elayne Snare, MD  traZODone (DESYREL) 50 MG tablet Take 25-50 mg by mouth at bedtime. 06/20/18  Yes [provider]   vitamin B-12 (CYANOCOBALAMIN) 1000 MCG tablet Take 1 tablet (1,000 mcg total) by mouth daily. Patient taking differently: Take 1,000 mcg by mouth every morning.  03/11/15  Yes Kathie Dike, MD  furosemide (LASIX) 20 MG tablet Take 1 tablet (20 mg total) by mouth daily. 07/31/18   Dorie Rank, MD  lisinopril (PRINIVIL,ZESTRIL) 10 MG tablet Take 2 tablets (20 mg total) by mouth daily. 07/31/18   Dorie Rank, MD     Allergies:     Allergies  Allergen Reactions  . Penicillins Other (See Comments)    Caused patient to pass out.Can take cephalosporins Has patient had a PCN reaction causing immediate rash, facial/tongue/throat swelling, SOB or lightheadedness with hypotension: no Has patient had a PCN reaction causing severe rash involving mucus membranes or skin necrosis: No Has patient had a PCN reaction that required hospitalization No Has patient had a PCN reaction occurring within the last 10 years: No If all of the above answers are "NO", then may proceed with Cephalosporin use.   . Levaquin [Levofloxacin] Nausea Only  . Sulfa Antibiotics Other (See Comments)    Unknown  . Zithromax [Azithromycin] Nausea Only and Rash     Physical Exam:   Vitals  Blood pressure (!) 176/84, pulse 77, temperature 97.9 F (36.6 C), temperature source Oral, resp. rate 16, height 5\' 9"  (1.753 m), weight 69.4 kg, SpO2 94 %.   1. General  lying in bed in NAD,   2. Normal affect and insight, Not Suicidal or Homicidal, Awake Alert, Oriented X 3.  3. No F.N deficits, ALL C.Nerves Intact, Strength 5/5 all 4 extremities, Sensation intact all 4 extremities, Plantars  down going.  4. Ears and Eyes appear Normal, Conjunctivae clear, PERRLA. Moist Oral Mucosa.  5. Supple Neck, No JVD, No cervical lymphadenopathy appriciated, No Carotid Bruits.  6. Symmetrical Chest wall movement, Good air movement bilaterally, CTAB.  7. Irr, irr, s1, s2  8. Positive Bowel Sounds, Abdomen Soft, No tenderness, No  organomegaly appriciated,No rebound -guarding or rigidity.  9.  No Cyanosis, Normal Skin Turgor, No Skin Rash or Bruise.  10. Good muscle tone,  joints appear normal , no effusions, Normal ROM.  11. No Palpable Lymph Nodes in Neck or Axillae     Data Review:    CBC Recent Labs  Lab 07/31/18 1724  WBC 9.0  HGB 12.4  HCT 40.6  PLT 234  MCV 95.8  MCH 29.2  MCHC 30.5  RDW 13.4   ------------------------------------------------------------------------------------------------------------------  Chemistries  Recent Labs  Lab 07/31/18 1724  NA 142  K 4.6  CL 109  CO2 27  GLUCOSE 96  BUN 16  CREATININE 1.00  CALCIUM 9.2   ------------------------------------------------------------------------------------------------------------------ estimated creatinine clearance is 44.5 mL/min (by C-G formula based on SCr of 1 mg/dL). ------------------------------------------------------------------------------------------------------------------ No results for input(s): TSH, T4TOTAL, T3FREE, THYROIDAB in the last 72 hours.  Invalid input(s): FREET3  Coagulation profile No results for input(s): INR, PROTIME in the last 168 hours. ------------------------------------------------------------------------------------------------------------------- No results for input(s): DDIMER in the last 72 hours. -------------------------------------------------------------------------------------------------------------------  Cardiac Enzymes Recent Labs  Lab 07/31/18 1724 07/31/18 2212  TROPONINI <0.03 <0.03   ------------------------------------------------------------------------------------------------------------------    Component Value Date/Time   BNP 597.0 (H) 07/31/2018 1736   BNP 218.0 (H) 02/27/2014 1008     ---------------------------------------------------------------------------------------------------------------  Urinalysis    Component Value Date/Time   COLORURINE  YELLOW 01/20/2017 1130   APPEARANCEUR CLEAR 01/20/2017 1130   LABSPEC 1.015 01/20/2017 1130   PHURINE 6.5 01/20/2017 1130   GLUCOSEU NEGATIVE 01/20/2017 1130   HGBUR TRACE (A) 01/20/2017 1130   BILIRUBINUR NEGATIVE 01/20/2017 1130   BILIRUBINUR 3+ 01/14/2016 1036   KETONESUR NEGATIVE 01/20/2017 1130   PROTEINUR 100 (A) 01/20/2017 1130   UROBILINOGEN negative 01/14/2016 1036   UROBILINOGEN 0.2 03/07/2015 2005   NITRITE NEGATIVE 01/20/2017 1130   LEUKOCYTESUR NEGATIVE 01/20/2017 1130    ----------------------------------------------------------------------------------------------------------------   Imaging Results:    Dg Chest 2 View  Result Date: 07/31/2018 CLINICAL DATA:  Dyspnea EXAM: CHEST - 2 VIEW COMPARISON:  None. FINDINGS: Cardiomegaly with moderate aortic atherosclerosis. Right hilar soft tissue prominence may be secondary to patient rotation and vascular overlap. Adenopathy or underlying mass is not entirely excluded. CT with IV contrast is suggested for better assessment. Emphysematous hyperinflation of the lungs with probable trace left effusion accounting for hazy opacity at the left lung base admixed with atelectasis. Acute osseous abnormality. Right-sided pacemaker apparatus with leads in the right atrium and right ventricle are stable. IMPRESSION: 1. COPD with cardiomegaly and aortic atherosclerosis. 2. Left basilar atelectasis with probable small left effusion. 3. Right hilar prominence may be due to patient rotation and vascular summation. Underlying adenopathy or mass cannot be entirely excluded. CT of the chest with IV contrast for better assessment is suggested. Electronically Signed   By: Ashley Royalty M.D.   On: 07/31/2018 15:30   Ct Angio Chest Pe W And/or Wo Contrast  Result Date: 07/31/2018 CLINICAL DATA:  Shortness of breath and chest pain beginning 2 days ago. EXAM: CT ANGIOGRAPHY CHEST WITH CONTRAST TECHNIQUE: Multidetector CT imaging of the chest was performed  using the standard protocol during bolus administration of intravenous contrast. Multiplanar  CT image reconstructions and MIPs were obtained to evaluate the vascular anatomy. CONTRAST:  60mL ISOVUE-370 IOPAMIDOL (ISOVUE-370) INJECTION 76% COMPARISON:  CT 05/27/2015 and chest x-ray 07/31/2018 FINDINGS: Cardiovascular: Mild cardiomegaly. Calcification of the mitral valve annulus. Cardiac pacer leads are present. Minimal calcified plaque over the 3 vessel coronary arteries. Mild calcified plaque over the thoracic aorta. Pulmonary arterial system is well opacified and demonstrates no evidence of emboli. Mediastinum/Nodes: 1.4 cm subcarinal lymph node. 1.4 cm precarinal lymph node. The likely reactive. No significant hilar adenopathy. Remaining mediastinal structures are unremarkable. Lungs/Pleura: Lungs are adequately inflated and demonstrate a small right pleural effusion and tiny amount left pleural fluid. No focal airspace consolidation. Scarring over the lingula and left lower lobe. Airways are within normal. Upper Abdomen: No acute findings. Calcified plaque over the abdominal aorta. Musculoskeletal: Degenerative change of the spine. Review of the MIP images confirms the above findings. IMPRESSION: No evidence of pulmonary embolism. Small right pleural effusion and tiny amount of left pleural fluid. Mild cardiomegaly.  Mild atherosclerotic coronary artery disease. Aortic Atherosclerosis (ICD10-I70.0). Minimal mediastinal adenopathy likely reactive. Electronically Signed   By: Marin Olp M.D.   On: 07/31/2018 19:30      Assessment & Plan:    Active Problems:   Hypertensive urgency    Dyspnea secondary to hypertensive urgency Hypertensive urgency Tele Trop I q6h x3 Check TSH Check cardiac echo Hydralazine 10mg  iv q6h prn sbp >100  Hypertension Cont Amlodipine 5mg  po qday Cont Lisinopril 20mg  po qhs Cont Toprol xl 50mg  po bid  Hyperlipidemia Cont Pravastatin 80mg  po qhs  Pafib Cont  Pradaxa Cont Toprol XL as above  Anxiety Cont Xanax Cont Buspar Cont Cymbalta  Hypothyroidism Cont Levothyroxine 54micrograms po qday  Gerd Cont PPI   DVT Prophylaxis  Pradaxa - SCDs   AM Labs Ordered, also please review Full Orders  Family Communication: Admission, patients condition and plan of care including tests being ordered have been discussed with the patient who indicate understanding and agree with the plan and Code Status.  Code Status  FULL CODE  Likely DC to  home  Condition GUARDED    Consults called:  none  Admission status: observation  Time spent in minutes : 7   Jani Gravel M.D on 07/31/2018 at 11:43 PM  Between 7am to 7pm - Pager - [phone]  . After 7pm go to [email or web address] - password [email or web address]  [facility] - Office  [phone]

## 2018-07-31 NOTE — ED Provider Notes (Addendum)
[facility] EMERGENCY DEPARTMENT Provider Note   CSN: [id number] Arrival date & time: 07/31/18  1427     History   Chief Complaint Chief Complaint  Patient presents with  . Shortness of Breath    HPI Kelsey Sandoval is a 84 y.o. female.   Shortness of Breath  This is a new problem. Episode onset: 1 month ago. The problem has been gradually worsening (today it was more severe). Pertinent negatives include no fever, no sore throat, no swollen glands, no cough, no PND, no vomiting, no abdominal pain and no leg swelling. She has tried beta-agonist inhalers for the symptoms. The treatment provided no relief. She has had no prior hospitalizations. Associated medical issues include COPD. Associated medical issues do not include PE, past MI or DVT.    Past Medical History:  Diagnosis Date  . Anxiety   . Arthritis   . Back pain, chronic    Sciatica  . Cataracts, bilateral   . Depression   . Dysrhythmia    AFib  . Essential hypertension   . GERD (gastroesophageal reflux disease)   . Hyperlipidemia   . Hypothyroidism   . Membranous nephropathy determined by biopsy 06/07/2015  . Nephrotic syndrome    RHUX  . Neuropathy   . Osteopenia   . Pacemaker    [employer]  . PAF (paroxysmal atrial fibrillation) ([place])   . Pre-diabetes   . Tachycardia-bradycardia syndrome [facility])     Patient Active Problem List   Diagnosis Date Noted  . Atrial fibrillation with RVR ([place]) 01/20/2017  . Nausea 01/20/2017  . Anxiety 01/20/2017  . Acute bronchitis 09/11/2016  . Chronic anticoagulation 09/07/2016  . Chronic kidney disease (CKD), stage III (moderate) ([place] [address]) 09/07/2016  . Hereditary and idiopathic peripheral neuropathy 03/05/2016  . Major depression 12/04/2015  . Major depression in remission ([place]) 09/02/2015  . Membranous nephropathy determined by biopsy 06/07/2015  . Osteoarthritis of both knees 03/26/2015  . Chronic pain syndrome 03/26/2015  . Intractable nausea and vomiting   . Nausea  with vomiting   . Malnutrition of moderate degree ([place]) 03/08/2015  . UTI (lower urinary tract infection) 03/07/2015  . Hypothyroidism 02/14/2015  . Disorder of kidney 01/17/2015  . Nephrotic syndrome 06/21/2014  . Proteinuria 03/29/2014  . Osteopenia 03/29/2014  . Anemia, iron deficiency 08/22/2013  . Mitral insufficiency 06/11/2013  . Pacemaker 06/11/2013  . Paroxysmal atrial fibrillation ([place]) 09/26/2012  . Tachycardia-bradycardia syndrome ([place]) 09/26/2012  . Sick sinus syndrome ([place]) 09/26/2012  . Systemic hypertension 09/26/2012  . Dyslipidemia 09/26/2012    Past Surgical History:  Procedure Laterality Date  . ABDOMINAL HYSTERECTOMY     partial-pt has no ovaries  . ANTERIOR VITRECTOMY Right 03/11/2018   Procedure: ANTERIOR VITRECTOMY;  Surgeon: Baruch Goldmann, MD;  Location: [facility] ORS;  Service: Ophthalmology;  Laterality: Right;  . APPENDECTOMY    . BACK SURGERY    . BIOPSY N/A 01/17/2014   Procedure: BIOPSY;  Surgeon: Rogene Houston, MD;  Location: [facility] ENDO SUITE;  Service: Endoscopy;  Laterality: N/A;  . CATARACT EXTRACTION W/PHACO Left 02/28/2015   Procedure: CATARACT EXTRACTION PHACO AND INTRAOCULAR LENS PLACEMENT (IOC);  Surgeon: [name], MD;  Location: [facility] ORS;  Service: Ophthalmology;  Laterality: Left;  CDE 18.71  . CATARACT EXTRACTION W/PHACO Right 03/11/2018   Procedure: CATARACT EXTRACTION PHACO  AND INTRAOCULAR LENS PLACEMENT RIGHT EYE ;  Surgeon: Baruch Goldmann, MD;  Location: [facility] ORS;  Service: Ophthalmology;  Laterality: Right;  CDE: 19.39  . COLONOSCOPY  9/05  .  COLONOSCOPY N/A 10/11/2013   Procedure: COLONOSCOPY;  Surgeon: Rogene Houston, MD;  Location: [facility] ENDO SUITE;  Service: Endoscopy;  Laterality: N/A;  1200  . ESOPHAGOGASTRODUODENOSCOPY N/A 01/17/2014   Procedure: ESOPHAGOGASTRODUODENOSCOPY (EGD);  Surgeon: Rogene Houston, MD;  Location: [facility] END[facility] SU[facility];  Service: Endoscopy;  Laterality: N/A;  230  . ESOPHAGOGASTRODUODENOSCOPY N/A 01/02/2016   Procedure:  ESOPHAGOGASTRODUODENOSCOPY (EGD);  Surgeon: Rogene Houston, MD;  Location: [facility] ENDO SUITE;  Service: Endoscopy;  Laterality: N/A;  240  . INSERT / REPLACE / REMOVE PACEMAKER     2012  . Wisdom tooth extracton       OB History    Gravida  2   Para  2   Term  2   Preterm      AB      Living        SAB      TAB      Ectopic      Multiple      Live Births               Home Medications    Prior to Admission medications   Medication Sig Start Date End Date Taking? Authorizing Provider  acetaminophen (TYLENOL) 650 MG CR tablet Take 1,300 mg by mouth every 8 (eight) hours as needed for pain.   Yes [provider]  albuterol (PROVENTIL HFA;VENTOLIN HFA) 108 (90 Base) MCG/ACT inhaler Inhale 2 puffs into the lungs every 6 (six) hours as needed for wheezing or shortness of breath. 02/17/18  Yes Kathyrn Drown, MD  ALPRAZolam Duanne Moron) 0.5 MG tablet Take 1 tablet (0.5 mg total) by mouth daily as needed for anxiety. Take one qhs Patient taking differently: Take 0.5 mg by mouth at bedtime. Take one qhs 06/09/18  Yes Cloria Spring, MD  amLODipine (NORVASC) 5 MG tablet Take 1 tablet (5 mg total) by mouth daily. Patient taking differently: Take 2.5 mg by mouth every morning.  04/26/18 07/31/18 Yes Evans Lance, MD  busPIRone (BUSPAR) 10 MG tablet Take 1 tablet (10 mg total) by mouth 3 (three) times daily. 06/09/18  Yes Cloria Spring, MD  DULoxetine (CYMBALTA) 60 MG capsule Take 1 capsule (60 mg total) by mouth 2 (two) times daily. 06/09/18  Yes Cloria Spring, MD  gabapentin (NEURONTIN) 300 MG capsule TAKE 1 CAPSULE BY MOUTH IN THE MORNING, 1 CAPSULE IN THE AFTERNOON, AND 2 CAPSULES IN THE EVENING Patient taking differently: Take 300 mg by mouth 3 (three) times daily.  07/05/18  Yes Kathyrn Drown, MD  hydrALAZINE (APRESOLINE) 25 MG tablet TAKE 1 TABLET BY MOUTH TWICE DAILY Patient taking differently: Take 25 mg by mouth 2 (two) times daily.  03/07/18  Yes Kathyrn Drown, MD    HYDROcodone-acetaminophen (NORCO) 10-325 MG tablet Take one tablet every 4 hours prn pain. Max 6 tablets per day 05/17/18  Yes Luking, Elayne Snare, MD  hydrOXYzine (ATARAX/VISTARIL) 10 MG tablet One bid prn nausea, caution drowsiness Patient taking differently: Take 10 mg by mouth 2 (two) times daily as needed for nausea or vomiting.  01/19/17  Yes Luking, Elayne Snare, MD  levothyroxine (SYNTHROID, LEVOTHROID) 50 MCG tablet TAKE 1 TABLET BY MOUTH ONCE DAILY Patient taking differently: Take 50 mcg by mouth daily before breakfast.  07/26/18  Yes Luking, Scott A, MD  metoprolol succinate (TOPROL-XL) 50 MG 24 hr tablet TAKE 1 TABLET BY MOUTH TWICE DAILY. TAKE WITH OR IMMEDIATELY FOLLOWING A MEAL. Patient taking differently: Take 50  mg by mouth 2 (two) times daily. TAKE WITH OR IMMEDIATELY FOLLOWING A MEAL. 06/07/18  Yes Luking, Elayne Snare, MD  mirtazapine (REMERON) 15 MG tablet Take 1 tablet (15 mg total) by mouth at bedtime. Patient taking differently: Take 7.5-15 mg by mouth at bedtime as needed (FOR SLEEP).  06/09/18  Yes Cloria Spring, MD  pantoprazole (PROTONIX) 40 MG tablet Take 1 tablet (40 mg total) by mouth daily. Patient taking differently: Take 40 mg by mouth every morning.  05/20/18  Yes Luking, Elayne Snare, MD  PRADAXA 150 MG CAPS capsule TAKE 1 CAPSULE BY MOUTH TWICE DAILY Patient taking differently: Take 150 mg by mouth 2 (two) times daily.  02/21/18  Yes Evans Lance, MD  pravastatin (PRAVACHOL) 80 MG tablet TAKE 1 TABLET BY MOUTH ONCE DAILY Patient taking differently: Take 80 mg by mouth every evening.  05/30/18  Yes Luking, Elayne Snare, MD  traZODone (DESYREL) 50 MG tablet Take 25-50 mg by mouth at bedtime. 06/20/18  Yes [provider]  vitamin B-12 (CYANOCOBALAMIN) 1000 MCG tablet Take 1 tablet (1,000 mcg total) by mouth daily. Patient taking differently: Take 1,000 mcg by mouth every morning.  03/11/15  Yes Kathie Dike, MD  furosemide (LASIX) 20 MG tablet Take 1 tablet (20 mg total) by mouth  daily. 07/31/18   Dorie Rank, MD  lisinopril (PRINIVIL,ZESTRIL) 10 MG tablet Take 2 tablets (20 mg total) by mouth daily. 07/31/18   Dorie Rank, MD    Family History Family History  Problem Relation Age of Onset  . Colon cancer Brother   . Anxiety disorder Sister   . Depression Sister   . Anxiety disorder Sister   . Depression Sister     Social History Social History   Tobacco Use  . Smoking status: Never Smoker  . Smokeless tobacco: Never Used  Substance Use Topics  . Alcohol use: No    Alcohol/week: 0.0 standard drinks  . Drug use: No     Allergies   Penicillins; Levaquin [levofloxacin]; Sulfa antibiotics; and Zithromax [azithromycin]   Review of Systems Review of Systems  Constitutional: Negative for fever.  HENT: Negative for sore throat.   Respiratory: Positive for shortness of breath. Negative for cough.   Cardiovascular: Negative for leg swelling and PND.  Gastrointestinal: Negative for abdominal pain and vomiting.  All other systems reviewed and are negative.    Physical Exam Updated Vital Signs BP (!) 187/106   Pulse 72   Temp 97.9 F (36.6 C) (Oral)   Resp 19   Ht 1.753 m (5\' 9" )   Wt 69.4 kg   SpO2 95%   BMI 22.59 kg/m   Physical Exam  Constitutional:  Non-toxic appearance. She does not appear ill. No distress.  HENT:  Head: Normocephalic and atraumatic.  Right Ear: External ear normal.  Left Ear: External ear normal.  Eyes: Conjunctivae are normal. Right eye exhibits no discharge. Left eye exhibits no discharge. No scleral icterus.  Neck: Neck supple. No tracheal deviation present.  Cardiovascular: Normal rate, regular rhythm and intact distal pulses.  Pulmonary/Chest: Effort normal and breath sounds normal. No stridor. No respiratory distress. She has no wheezes. She has no rales.  Abdominal: Soft. Bowel sounds are normal. She exhibits no distension. There is no tenderness. There is no rebound and no guarding.  Musculoskeletal: She  exhibits no tenderness.       Right lower leg: She exhibits edema.       Left lower leg: She exhibits edema.  Neurological: She is alert. She has normal strength. No cranial nerve deficit (no facial droop, extraocular movements intact, no slurred speech) or sensory deficit. She exhibits normal muscle tone. She displays no seizure activity. Coordination normal.  Skin: Skin is warm and dry. No rash noted.  Psychiatric: She has a normal mood and affect.  Nursing note and vitals reviewed.    ED Treatments / Results  Labs (all labs ordered are listed, but only abnormal results are displayed) Labs Reviewed  BASIC METABOLIC PANEL - Abnormal; Notable for the following components:      Result Value   GFR calc non Af Amer 51 (*)    GFR calc Af Amer 59 (*)    All other components within normal limits  BRAIN NATRIURETIC PEPTIDE - Abnormal; Notable for the following components:   B Natriuretic Peptide 597.0 (*)    All other components within normal limits  CBC  TROPONIN I    EKG EKG Interpretation  Date/Time:  Sunday July 31 2018 14:39:34 EDT Ventricular Rate:  79 PR Interval:    QRS Duration: 80 QT Interval:  384 QTC Calculation: 440 R Axis:   16 Text Interpretation:  Atrial fibrillation with a competing junctional pacemaker Abnormal ECG t wave abnormality on prior ECG less prominent Confirmed by Dorie Rank [phone]) on 07/31/2018 6:28:36 PM   Radiology Dg Chest 2 View  Result Date: 07/31/2018 CLINICAL DATA:  Dyspnea EXAM: CHEST - 2 VIEW COMPARISON:  None. FINDINGS: Cardiomegaly with moderate aortic atherosclerosis. Right hilar soft tissue prominence may be secondary to patient rotation and vascular overlap. Adenopathy or underlying mass is not entirely excluded. CT with IV contrast is suggested for better assessment. Emphysematous hyperinflation of the lungs with probable trace left effusion accounting for hazy opacity at the left lung base admixed with atelectasis. Acute osseous  abnormality. Right-sided pacemaker apparatus with leads in the right atrium and right ventricle are stable. IMPRESSION: 1. COPD with cardiomegaly and aortic atherosclerosis. 2. Left basilar atelectasis with probable small left effusion. 3. Right hilar prominence may be due to patient rotation and vascular summation. Underlying adenopathy or mass cannot be entirely excluded. CT of the chest with IV contrast for better assessment is suggested. Electronically Signed   By: Ashley Royalty M.D.   On: 07/31/2018 15:30   Ct Angio Chest Pe W And/or Wo Contrast  Result Date: 07/31/2018 CLINICAL DATA:  Shortness of breath and chest pain beginning 2 days ago. EXAM: CT ANGIOGRAPHY CHEST WITH CONTRAST TECHNIQUE: Multidetector CT imaging of the chest was performed using the standard protocol during bolus administration of intravenous contrast. Multiplanar CT image reconstructions and MIPs were obtained to evaluate the vascular anatomy. CONTRAST:  43mL ISOVUE-370 IOPAMIDOL (ISOVUE-370) INJECTION 76% COMPARISON:  CT 05/27/2015 and chest x-ray 07/31/2018 FINDINGS: Cardiovascular: Mild cardiomegaly. Calcification of the mitral valve annulus. Cardiac pacer leads are present. Minimal calcified plaque over the 3 vessel coronary arteries. Mild calcified plaque over the thoracic aorta. Pulmonary arterial system is well opacified and demonstrates no evidence of emboli. Mediastinum/Nodes: 1.4 cm subcarinal lymph node. 1.4 cm precarinal lymph node. The likely reactive. No significant hilar adenopathy. Remaining mediastinal structures are unremarkable. Lungs/Pleura: Lungs are adequately inflated and demonstrate a small right pleural effusion and tiny amount left pleural fluid. No focal airspace consolidation. Scarring over the lingula and left lower lobe. Airways are within normal. Upper Abdomen: No acute findings. Calcified plaque over the abdominal aorta. Musculoskeletal: Degenerative change of the spine. Review of the MIP images confirms  the above findings.  IMPRESSION: No evidence of pulmonary embolism. Small right pleural effusion and tiny amount of left pleural fluid. Mild cardiomegaly.  Mild atherosclerotic coronary artery disease. Aortic Atherosclerosis (ICD10-I70.0). Minimal mediastinal adenopathy likely reactive. Electronically Signed   By: Marin Olp M.D.   On: 07/31/2018 19:30    Procedures .Critical Care Performed by: Dorie Rank, MD Authorized by: Dorie Rank, MD   Critical care provider statement:    Critical care time (minutes):  30   Critical care was time spent personally by me on the following activities:  Discussions with consultants, evaluation of patient's response to treatment, examination of patient, ordering and performing treatments and interventions, ordering and review of laboratory studies, ordering and review of radiographic studies, pulse oximetry, re-evaluation of patient's condition, obtaining history from patient or surrogate and review of old charts   (including critical care time)  Medications Ordered in ED Medications  nitroGLYCERIN (NITROGLYN) 2 % ointment 1 inch (1 inch Topical Given 07/31/18 2004)  amLODipine (NORVASC) tablet 5 mg (5 mg Oral Given 07/31/18 1827)  iopamidol (ISOVUE-370) 76 % injection 75 mL (75 mLs Intravenous Contrast Given 07/31/18 1859)  furosemide (LASIX) injection 40 mg (40 mg Intravenous Given 07/31/18 2000)  lisinopril (PRINIVIL,ZESTRIL) tablet 10 mg (10 mg Oral Given 07/31/18 2004)     Initial Impression / Assessment and Plan / ED Course  I have reviewed the triage vital signs and the nursing notes.  Pertinent labs & imaging results that were available during my care of the patient were reviewed by me and considered in my medical decision making (see chart for details).  Clinical Course as of Jul 31 2026  Nancy Fetter Jul 31, 2018  1915 blood pressure remains elevated.  Will add on lasix and NTG   [JK]  2025 Patient's blood pressure is slightly better although still  elevated.  Patient has been given additional lisinopril as well as Lasix   [JK]    Clinical Course User Index [JK] Dorie Rank, MD    Patient presented to the emergency room with complaints of shortness of breath.  Symptoms were concerning for the possibility of pulmonary embolism versus congestive heart failure versus pneumonia.  Chest x-ray did show some pleural effusions but no evidence of pneumonia or severe CHF. c T scan did not show evidence of pulmonary embolism.  Patient remained hypertensive in the emergency room.  I suspect the patient's symptoms are related to congestive heart failure associated with poorly controlled hypertension.  Recommended admission to the hospital so we can get her blood pressure under control and try some gentle diuresis.  Patient was adamant that she did not want to be admitted to the hospital.  She is not having any difficulty breathing and she is not hypoxic.  I will have her increase her lisinopril.  Also start her on Lasix.  I recommended she follow-up with her primary care doctor or cardiologist earlier this week to make sure she is improving.  Patient understands for any worsening symptoms.  Final Clinical Impressions(s) / ED Diagnoses   Final diagnoses:  Essential hypertension  Acute on chronic congestive heart failure, unspecified heart failure type [facility])    ED Discharge Orders         Ordered    lisinopril (PRINIVIL,ZESTRIL) 10 MG tablet  Daily     07/31/18 2025    furosemide (LASIX) 20 MG tablet  Daily     07/31/18 2025           Dorie Rank, MD 07/31/18 2027  Family  members have now convinced pt to stay     Dorie Rank, MD 07/31/18 2120

## 2018-07-31 NOTE — ED Notes (Signed)
Ice packs provided for bilateral knees.

## 2018-07-31 NOTE — Discharge Instructions (Signed)
You decided to go home today instead of being admitted to the hospital. Increase your lisinopril to 20 mg daily.  Take the Lasix as prescribed.  Make sure to follow-up with either your cardiologist or primary care doctor in the next couple of days to make sure you are improving.  Return to the emergency room if you start having any worsening symptoms.

## 2018-07-31 NOTE — ED Notes (Signed)
Pt to CT at this time.

## 2018-07-31 NOTE — ED Notes (Signed)
Pt c/o headache- will review PRN medications

## 2018-07-31 NOTE — ED Notes (Signed)
Pt assisted to bathroom via WC- pt did have some shortness of breath noted.

## 2018-08-01 ENCOUNTER — Observation Stay (HOSPITAL_COMMUNITY): Payer: PPO

## 2018-08-01 DIAGNOSIS — I160 Hypertensive urgency: Secondary | ICD-10-CM | POA: Diagnosis not present

## 2018-08-01 LAB — CBC
HEMATOCRIT: 37.5 % (ref 36.0–46.0)
HEMOGLOBIN: 11.9 g/dL — AB (ref 12.0–15.0)
MCH: 29.9 pg (ref 26.0–34.0)
MCHC: 31.7 g/dL (ref 30.0–36.0)
MCV: 94.2 fL (ref 80.0–100.0)
Platelets: 231 10*3/uL (ref 150–400)
RBC: 3.98 MIL/uL (ref 3.87–5.11)
RDW: 13.2 % (ref 11.5–15.5)
WBC: 10 10*3/uL (ref 4.0–10.5)
nRBC: 0 % (ref 0.0–0.2)

## 2018-08-01 LAB — COMPREHENSIVE METABOLIC PANEL
ALT: 11 U/L (ref 0–44)
AST: 22 U/L (ref 15–41)
Albumin: 3.6 g/dL (ref 3.5–5.0)
Alkaline Phosphatase: 75 U/L (ref 38–126)
Anion gap: 10 (ref 5–15)
BUN: 15 mg/dL (ref 8–23)
CHLORIDE: 103 mmol/L (ref 98–111)
CO2: 26 mmol/L (ref 22–32)
Calcium: 9 mg/dL (ref 8.9–10.3)
Creatinine, Ser: 0.95 mg/dL (ref 0.44–1.00)
GFR calc Af Amer: >60 mL/min (ref >60)
GFR, EST NON AFRICAN AMERICAN: 54 mL/min — AB (ref >60)
Glucose, Bld: 93 mg/dL (ref 70–99)
POTASSIUM: 3.7 mmol/L (ref 3.5–5.1)
Sodium: 139 mmol/L (ref 135–145)
Total Bilirubin: 0.8 mg/dL (ref 0.3–1.2)
Total Protein: 6.3 g/dL — ABNORMAL LOW (ref 6.5–8.1)

## 2018-08-01 LAB — TROPONIN I: Troponin I: <0.03 ng/mL (ref <0.03)

## 2018-08-01 MED ORDER — GABAPENTIN 300 MG PO CAPS: 300.0000 mg | ORAL_CAPSULE | Freq: Three times a day (TID) | ORAL | Status: DC

## 2018-08-01 MED ORDER — LISINOPRIL 10 MG PO TABS: 20.0000 mg | ORAL_TABLET | Freq: Every day | ORAL | 1 refills | Status: DC

## 2018-08-01 MED ORDER — HYDRALAZINE HCL 25 MG PO TABS: 25.0000 mg | ORAL_TABLET | Freq: Three times a day (TID) | ORAL | 0 refills | Status: DC

## 2018-08-01 MED ORDER — PRAVASTATIN SODIUM 40 MG PO TABS: 80.0000 mg | ORAL_TABLET | Freq: Every evening | ORAL | Status: DC

## 2018-08-01 MED ORDER — TRAZODONE HCL 50 MG PO TABS: 25.0000 mg | ORAL_TABLET | Freq: Every day | ORAL | Status: DC

## 2018-08-01 MED ORDER — FUROSEMIDE 20 MG PO TABS: 20.0000 mg | ORAL_TABLET | Freq: Every day | ORAL | 0 refills | Status: DC

## 2018-08-01 MED ORDER — PANTOPRAZOLE SODIUM 40 MG PO TBEC
40.0000 mg | DELAYED_RELEASE_TABLET | Freq: Every morning | ORAL | Status: DC
  Administered 2018-08-01: 40 mg via ORAL
  Filled 2018-08-01: qty 1

## 2018-08-01 MED ORDER — INFLUENZA VAC SPLIT HIGH-DOSE 0.5 ML IM SUSY: 0.5000 mL | PREFILLED_SYRINGE | INTRAMUSCULAR | Status: DC

## 2018-08-01 MED ORDER — HYDRALAZINE HCL 25 MG PO TABS
25.0000 mg | ORAL_TABLET | Freq: Three times a day (TID) | ORAL | Status: DC
  Administered 2018-08-01 (×2): 25 mg via ORAL
  Filled 2018-08-01 (×2): qty 1

## 2018-08-01 MED ORDER — ALPRAZOLAM 0.5 MG PO TABS
0.5000 mg | ORAL_TABLET | Freq: Every day | ORAL | Status: DC
  Administered 2018-08-01: 0.5 mg via ORAL
  Filled 2018-08-01: qty 1

## 2018-08-01 MED ORDER — GABAPENTIN 300 MG PO CAPS
300.0000 mg | ORAL_CAPSULE | Freq: Two times a day (BID) | ORAL | Status: DC
  Administered 2018-08-01 (×2): 300 mg via ORAL
  Filled 2018-08-01 (×2): qty 1

## 2018-08-01 MED ORDER — BUDESONIDE-FORMOTEROL FUMARATE 80-4.5 MCG/ACT IN AERO: 2.0000 | INHALATION_SPRAY | Freq: Every morning | RESPIRATORY_TRACT | 12 refills | Status: DC

## 2018-08-01 MED ORDER — BUSPIRONE HCL 5 MG PO TABS
10.0000 mg | ORAL_TABLET | Freq: Three times a day (TID) | ORAL | Status: DC
  Administered 2018-08-01 (×2): 10 mg via ORAL
  Filled 2018-08-01 (×2): qty 2

## 2018-08-01 MED ORDER — DULOXETINE HCL 60 MG PO CPEP
60.0000 mg | ORAL_CAPSULE | Freq: Two times a day (BID) | ORAL | Status: DC
  Administered 2018-08-01: 60 mg via ORAL
  Filled 2018-08-01 (×2): qty 1

## 2018-08-01 MED ORDER — GABAPENTIN 300 MG PO CAPS: 600.0000 mg | ORAL_CAPSULE | Freq: Every day | ORAL | Status: DC

## 2018-08-01 MED ORDER — LEVOTHYROXINE SODIUM 50 MCG PO TABS
50.0000 ug | ORAL_TABLET | Freq: Every day | ORAL | Status: DC
  Administered 2018-08-01: 50 ug via ORAL
  Filled 2018-08-01: qty 1

## 2018-08-01 MED ORDER — ONDANSETRON HCL 4 MG/2ML IJ SOLN
4.0000 mg | Freq: Four times a day (QID) | INTRAMUSCULAR | Status: DC | PRN
  Administered 2018-08-01 (×2): 4 mg via INTRAVENOUS
  Filled 2018-08-01 (×2): qty 2

## 2018-08-01 MED ORDER — AMLODIPINE BESYLATE 5 MG PO TABS
5.0000 mg | ORAL_TABLET | Freq: Every day | ORAL | Status: DC
  Administered 2018-08-01: 5 mg via ORAL
  Filled 2018-08-01: qty 1

## 2018-08-01 MED ORDER — METOPROLOL SUCCINATE ER 50 MG PO TB24
50.0000 mg | ORAL_TABLET | Freq: Two times a day (BID) | ORAL | Status: DC
  Administered 2018-08-01 (×2): 50 mg via ORAL
  Filled 2018-08-01 (×2): qty 1

## 2018-08-01 MED ORDER — ALBUTEROL SULFATE (2.5 MG/3ML) 0.083% IN NEBU: 3.0000 mL | INHALATION_SOLUTION | Freq: Four times a day (QID) | RESPIRATORY_TRACT | Status: DC | PRN

## 2018-08-01 MED ORDER — DABIGATRAN ETEXILATE MESYLATE 150 MG PO CAPS
150.0000 mg | ORAL_CAPSULE | Freq: Two times a day (BID) | ORAL | Status: DC
  Administered 2018-08-01: 150 mg via ORAL
  Filled 2018-08-01: qty 1

## 2018-08-01 MED ORDER — AMLODIPINE BESYLATE 5 MG PO TABS: 5.0000 mg | ORAL_TABLET | Freq: Every day | ORAL | 3 refills | Status: DC

## 2018-08-01 NOTE — Care Management Obs Status (Signed)
[facility] NOTIFICATION   Patient Details  Name: MARQUIS DILES MRN: [id number] Date of Birth: 1932-11-07   Medicare Observation Status Notification Given:  Yes    Shelda Altes 08/01/2018, 11:27 AM

## 2018-08-01 NOTE — Discharge Summary (Addendum)
Physician Discharge Summary  Kelsey Sandoval QQP:[id number] DOB: May 18, 1933 DOA: 07/31/2018  PCP: Kathyrn Drown, MD  Admit date: 07/31/2018  Discharge date: 08/01/2018  Admitted From: Home  Disposition: Home  Recommendations for Outpatient Follow-up:  1. Follow up with PCP in 1-2 weeks Follow blood pressure readings at that time.  Home Health: None  Equipment/Devices: None, has walker at home  Discharge Condition: Stable  CODE STATUS: DNR  Diet recommendation: Heart Healthy  Brief/Interim Summary: This is an 84 year old Caucasian female with history of hypertension, dyslipidemia, hypothyroidism, and paroxysmal atrial fibrillation who presented with complaints of shortness of breath that was thought to be related to hypertensive crisis.  She appears to be taking her medications at home, but states that she may have missed some doses.  She is otherwise competent to take her medications at home and cannot give any reasons for missing the doses.  I have refilled her medications as previously prescribed and urged her follow-up with her PCP in the next 1 week to ensure that her blood pressure remains stable.  Her blood pressures are now well controlled and she has no further symptomatology.  She has been ambulated with no decrease in O2 saturation noted either.  Discharge Diagnoses:  Active Problems:   Hypertensive urgency  Principal diagnosis: Hypertensive crisis.  Discharge Instructions  Discharge Instructions    Diet - low sodium heart healthy   Complete by:  As directed    Increase activity slowly   Complete by:  As directed      Allergies as of 08/01/2018      Reactions   Penicillins Other (See Comments)   Caused patient to pass out.Can take cephalosporins Has patient had a PCN reaction causing immediate rash, facial/tongue/throat swelling, SOB or lightheadedness with hypotension: no Has patient had a PCN reaction causing severe rash involving mucus membranes or skin  necrosis: No Has patient had a PCN reaction that required hospitalization No Has patient had a PCN reaction occurring within the last 10 years: No If all of the above answers are "NO", then may proceed with Cephalosporin use.   Levaquin [levofloxacin] Nausea Only   Sulfa Antibiotics Other (See Comments)   Unknown   Zithromax [azithromycin] Nausea Only, Rash      Medication List    TAKE these medications   acetaminophen 650 MG CR tablet Commonly known as:  TYLENOL Take 1,300 mg by mouth every 8 (eight) hours as needed for pain.   albuterol 108 (90 Base) MCG/ACT inhaler Commonly known as:  PROVENTIL HFA;VENTOLIN HFA Inhale 2 puffs into the lungs every 6 (six) hours as needed for wheezing or shortness of breath.   ALPRAZolam 0.5 MG tablet Commonly known as:  XANAX Take 1 tablet (0.5 mg total) by mouth daily as needed for anxiety. Take one qhs What changed:  when to take this   amLODipine 5 MG tablet Commonly known as:  NORVASC Take 1 tablet (5 mg total) by mouth daily. What changed:    how much to take  when to take this   budesonide-formoterol 80-4.5 MCG/ACT inhaler Commonly known as:  SYMBICORT Inhale 2 puffs into the lungs every morning.   busPIRone 10 MG tablet Commonly known as:  BUSPAR Take 1 tablet (10 mg total) by mouth 3 (three) times daily.   DULoxetine 60 MG capsule Commonly known as:  CYMBALTA Take 1 capsule (60 mg total) by mouth 2 (two) times daily.   furosemide 20 MG tablet Commonly known as:  LASIX Take 1  tablet (20 mg total) by mouth daily.   gabapentin 300 MG capsule Commonly known as:  NEURONTIN TAKE 1 CAPSULE BY MOUTH IN THE MORNING, 1 CAPSULE IN THE AFTERNOON, AND 2 CAPSULES IN THE EVENING What changed:  See the new instructions.   hydrALAZINE 25 MG tablet Commonly known as:  APRESOLINE Take 1 tablet (25 mg total) by mouth every 8 (eight) hours. What changed:  when to take this   HYDROcodone-acetaminophen 10-325 MG tablet Commonly known  as:  NORCO Take one tablet every 4 hours prn pain. Max 6 tablets per day   hydrOXYzine 10 MG tablet Commonly known as:  ATARAX/VISTARIL One bid prn nausea, caution drowsiness What changed:    how much to take  how to take this  when to take this  reasons to take this  additional instructions   levothyroxine 50 MCG tablet Commonly known as:  SYNTHROID, LEVOTHROID TAKE 1 TABLET BY MOUTH ONCE DAILY What changed:  when to take this   lisinopril 10 MG tablet Commonly known as:  PRINIVIL,ZESTRIL Take 2 tablets (20 mg total) by mouth daily. What changed:  how much to take   metoprolol succinate 50 MG 24 hr tablet Commonly known as:  TOPROL-XL TAKE 1 TABLET BY MOUTH TWICE DAILY. TAKE WITH OR IMMEDIATELY FOLLOWING A MEAL. What changed:    how much to take  how to take this  when to take this  additional instructions   mirtazapine 15 MG tablet Commonly known as:  REMERON Take 1 tablet (15 mg total) by mouth at bedtime. What changed:    how much to take  when to take this  reasons to take this   pantoprazole 40 MG tablet Commonly known as:  PROTONIX Take 1 tablet (40 mg total) by mouth daily. What changed:  when to take this   PRADAXA 150 MG Caps capsule Generic drug:  dabigatran TAKE 1 CAPSULE BY MOUTH TWICE DAILY What changed:  how much to take   pravastatin 80 MG tablet Commonly known as:  PRAVACHOL TAKE 1 TABLET BY MOUTH ONCE DAILY What changed:  when to take this   traZODone 50 MG tablet Commonly known as:  DESYREL Take 25-50 mg by mouth at bedtime.   vitamin B-12 1000 MCG tablet Commonly known as:  CYANOCOBALAMIN Take 1 tablet (1,000 mcg total) by mouth daily. What changed:  when to take this      Follow-up Information    Luking, Elayne Snare, MD Follow up in 1 week(s).   Specialty:  Family Medicine Contact information: [place] [address] [phone]          Allergies  Allergen Reactions  . Penicillins  Other (See Comments)    Caused patient to pass out.Can take cephalosporins Has patient had a PCN reaction causing immediate rash, facial/tongue/throat swelling, SOB or lightheadedness with hypotension: no Has patient had a PCN reaction causing severe rash involving mucus membranes or skin necrosis: No Has patient had a PCN reaction that required hospitalization No Has patient had a PCN reaction occurring within the last 10 years: No If all of the above answers are "NO", then may proceed with Cephalosporin use.   . Levaquin [Levofloxacin] Nausea Only  . Sulfa Antibiotics Other (See Comments)    Unknown  . Zithromax [Azithromycin] Nausea Only and Rash    Consultations:  None   Procedures/Studies: Dg Chest 2 View  Result Date: 07/31/2018 CLINICAL DATA:  Dyspnea EXAM: CHEST - 2 VIEW COMPARISON:  None.  FINDINGS: Cardiomegaly with moderate aortic atherosclerosis. Right hilar soft tissue prominence may be secondary to patient rotation and vascular overlap. Adenopathy or underlying mass is not entirely excluded. CT with IV contrast is suggested for better assessment. Emphysematous hyperinflation of the lungs with probable trace left effusion accounting for hazy opacity at the left lung base admixed with atelectasis. Acute osseous abnormality. Right-sided pacemaker apparatus with leads in the right atrium and right ventricle are stable. IMPRESSION: 1. COPD with cardiomegaly and aortic atherosclerosis. 2. Left basilar atelectasis with probable small left effusion. 3. Right hilar prominence may be due to patient rotation and vascular summation. Underlying adenopathy or mass cannot be entirely excluded. CT of the chest with IV contrast for better assessment is suggested. Electronically Signed   By: Ashley Royalty M.D.   On: 07/31/2018 15:30   Ct Angio Chest Pe W And/or Wo Contrast  Result Date: 07/31/2018 CLINICAL DATA:  Shortness of breath and chest pain beginning 2 days ago. EXAM: CT ANGIOGRAPHY CHEST  WITH CONTRAST TECHNIQUE: Multidetector CT imaging of the chest was performed using the standard protocol during bolus administration of intravenous contrast. Multiplanar CT image reconstructions and MIPs were obtained to evaluate the vascular anatomy. CONTRAST:  50mL ISOVUE-370 IOPAMIDOL (ISOVUE-370) INJECTION 76% COMPARISON:  CT 05/27/2015 and chest x-ray 07/31/2018 FINDINGS: Cardiovascular: Mild cardiomegaly. Calcification of the mitral valve annulus. Cardiac pacer leads are present. Minimal calcified plaque over the 3 vessel coronary arteries. Mild calcified plaque over the thoracic aorta. Pulmonary arterial system is well opacified and demonstrates no evidence of emboli. Mediastinum/Nodes: 1.4 cm subcarinal lymph node. 1.4 cm precarinal lymph node. The likely reactive. No significant hilar adenopathy. Remaining mediastinal structures are unremarkable. Lungs/Pleura: Lungs are adequately inflated and demonstrate a small right pleural effusion and tiny amount left pleural fluid. No focal airspace consolidation. Scarring over the lingula and left lower lobe. Airways are within normal. Upper Abdomen: No acute findings. Calcified plaque over the abdominal aorta. Musculoskeletal: Degenerative change of the spine. Review of the MIP images confirms the above findings. IMPRESSION: No evidence of pulmonary embolism. Small right pleural effusion and tiny amount of left pleural fluid. Mild cardiomegaly.  Mild atherosclerotic coronary artery disease. Aortic Atherosclerosis (ICD10-I70.0). Minimal mediastinal adenopathy likely reactive. Electronically Signed   By: Marin Olp M.D.   On: 07/31/2018 19:30    Discharge Exam: Vitals:   08/01/18 0930 08/01/18 1505  BP: (!) 142/89 (!) 155/76  Pulse: 88 73  Resp: 18 16  Temp: 98.4 F (36.9 C)   SpO2: 94% 96%   Vitals:   08/01/18 0014 08/01/18 0703 08/01/18 0930 08/01/18 1505  BP: (!) 167/83 134/67 (!) 142/89 (!) 155/76  Pulse: 69 74 88 73  Resp: 16 16 18 16   Temp:  98 F (36.7 C) 98 F (36.7 C) 98.4 F (36.9 C)   TempSrc: Oral Oral Oral   SpO2: 96% 95% 94% 96%  Weight:      Height:        General: Pt is alert, awake, not in acute distress Cardiovascular: RRR, S1/S2 +, no rubs, no gallops Respiratory: CTA bilaterally, no wheezing, no rhonchi Abdominal: Soft, NT, ND, bowel sounds + Extremities: no edema, no cyanosis    The results of significant diagnostics from this hospitalization (including imaging, microbiology, ancillary and laboratory) are listed below for reference.     Microbiology: No results found for this or any previous visit (from the past 240 hour(s)).   Labs: BNP (last 3 results) Recent Labs    07/31/18 1736  BNP 384.5*   Basic Metabolic Panel: Recent Labs  Lab 07/31/18 1724 08/01/18 0252  NA 142 139  K 4.6 3.7  CL 109 103  CO2 27 26  GLUCOSE 96 93  BUN 16 15  CREATININE 1.00 0.95  CALCIUM 9.2 9.0   Liver Function Tests: Recent Labs  Lab 08/01/18 0252  AST 22  ALT 11  ALKPHOS 75  BILITOT 0.8  PROT 6.3*  ALBUMIN 3.6   No results for input(s): LIPASE, AMYLASE in the last 168 hours. No results for input(s): AMMONIA in the last 168 hours. CBC: Recent Labs  Lab 07/31/18 1724 08/01/18 0252  WBC 9.0 10.0  HGB 12.4 11.9*  HCT 40.6 37.5  MCV 95.8 94.2  PLT 234 231   Cardiac Enzymes: Recent Labs  Lab 07/31/18 1724 07/31/18 2212 08/01/18 0252 08/01/18 0934  TROPONINI <0.03 <0.03 <0.03 <0.03   BNP: Invalid input(s): POCBNP CBG: No results for input(s): GLUCAP in the last 168 hours. D-Dimer No results for input(s): DDIMER in the last 72 hours. Hgb A1c No results for input(s): HGBA1C in the last 72 hours. Lipid Profile No results for input(s): CHOL, HDL, LDLCALC, TRIG, CHOLHDL, LDLDIRECT in the last 72 hours. Thyroid function studies No results for input(s): TSH, T4TOTAL, T3FREE, THYROIDAB in the last 72 hours.  Invalid input(s): FREET3 Anemia work up No results for input(s):  VITAMINB12, FOLATE, FERRITIN, TIBC, IRON, RETICCTPCT in the last 72 hours. Urinalysis    Component Value Date/Time   COLORURINE YELLOW 01/20/2017 1130   APPEARANCEUR CLEAR 01/20/2017 1130   LABSPEC 1.015 01/20/2017 1130   PHURINE 6.5 01/20/2017 1130   GLUCOSEU NEGATIVE 01/20/2017 1130   HGBUR TRACE (A) 01/20/2017 1130   BILIRUBINUR NEGATIVE 01/20/2017 1130   BILIRUBINUR 3+ 01/14/2016 1036   KETONESUR NEGATIVE 01/20/2017 1130   PROTEINUR 100 (A) 01/20/2017 1130   UROBILINOGEN negative 01/14/2016 1036   UROBILINOGEN 0.2 03/07/2015 2005   NITRITE NEGATIVE 01/20/2017 1130   LEUKOCYTESUR NEGATIVE 01/20/2017 1130   Sepsis Labs Invalid input(s): PROCALCITONIN,  WBC,  LACTICIDVEN Microbiology No results found for this or any previous visit (from the past 240 hour(s)).   Time coordinating discharge: 35 minutes  SIGNED:   Rodena Goldmann, DO [facility] 08/01/2018, 5:39 PM Pager [phone]  If 7PM-7AM, please contact night-coverage [email or web address] Password [email or web address]

## 2018-08-01 NOTE — Progress Notes (Addendum)
Patient had recent echo.  Dr. Manuella Ghazi notified.  Stated if patient ambulates without difficulty no need for new echo.  Patient tolerated well and being discharged home.  AVS reviewed with patient's daughter.  Verbalized understanding of discharge instructions, physician follow-up, medications.  Both IVs removed.  Site WNL.  Patient transported by NT via wheelchair to main entrance at discharge.  Patient stable at time of discharge.  Echo discontinued per Dr. Manuella Ghazi.

## 2018-08-01 NOTE — Progress Notes (Signed)
   Patient Saturations on Room Air at Rest = 97%  Patient Saturations on Room Air while Ambulating = 92%  Patient's heart rate up to 130s during ambulation but returned to 80s.  Patient resting in bed on RA.

## 2018-08-01 NOTE — Progress Notes (Signed)
Spoke with Olean Ree, RN to see if patient needed another echo. She had one 5 months ago. RN checking with doctor.

## 2018-08-02 ENCOUNTER — Other Ambulatory Visit (HOSPITAL_COMMUNITY): Payer: Self-pay | Admitting: *Deleted

## 2018-08-02 ENCOUNTER — Other Ambulatory Visit (HOSPITAL_COMMUNITY): Payer: Self-pay

## 2018-08-03 ENCOUNTER — Encounter: Payer: Self-pay | Admitting: Family Medicine

## 2018-08-03 ENCOUNTER — Ambulatory Visit (INDEPENDENT_AMBULATORY_CARE_PROVIDER_SITE_OTHER): Payer: PPO | Admitting: Family Medicine

## 2018-08-03 VITALS — BP 130/84 | Ht 69.0 in | Wt 159.4 lb

## 2018-08-03 DIAGNOSIS — I10 Essential (primary) hypertension: Secondary | ICD-10-CM

## 2018-08-03 DIAGNOSIS — Z23 Encounter for immunization: Secondary | ICD-10-CM

## 2018-08-03 DIAGNOSIS — I509 Heart failure, unspecified: Secondary | ICD-10-CM

## 2018-08-03 DIAGNOSIS — N183 Chronic kidney disease, stage 3 unspecified: Secondary | ICD-10-CM

## 2018-08-03 MED ORDER — TRAZODONE HCL 50 MG PO TABS: ORAL_TABLET | ORAL | 5 refills | Status: DC

## 2018-08-03 MED ORDER — HYDRALAZINE HCL 25 MG PO TABS: 25.0000 mg | ORAL_TABLET | Freq: Three times a day (TID) | ORAL | 0 refills | Status: DC

## 2018-08-03 MED ORDER — BUDESONIDE-FORMOTEROL FUMARATE 80-4.5 MCG/ACT IN AERO: 2.0000 | INHALATION_SPRAY | Freq: Every morning | RESPIRATORY_TRACT | 12 refills | Status: DC

## 2018-08-03 MED ORDER — AMLODIPINE BESYLATE 5 MG PO TABS: 5.0000 mg | ORAL_TABLET | Freq: Every day | ORAL | 3 refills | Status: DC

## 2018-08-03 MED ORDER — LISINOPRIL 20 MG PO TABS: 20.0000 mg | ORAL_TABLET | Freq: Every day | ORAL | 1 refills | Status: DC

## 2018-08-03 NOTE — Progress Notes (Signed)
   Subjective:    Patient ID: Kelsey Sandoval, female    DOB: 06-25-1933, 84 y.o.   MRN: [id number]  HPI Pt here today for hospital follow up.  Patient was in the hospital because of CHF She had some shortness of breath coughing swelling in her legs Elevated BNP CAT scan did not show DVT Did show a little bit of pleuritic fluid She was treated with Lasix and increased blood pressure medicine because of severely elevated blood pressure She is at home currently family looks in on her she does fix some of her meals no falls no injuries recently She will need some follow-up lab work She will also need refills on her medicine Education regarding falls, CHF, proper use of medicine  Review of Systems  Constitutional: Negative for activity change, appetite change and fatigue.  HENT: Negative for congestion and rhinorrhea.   Respiratory: Negative for cough and shortness of breath.   Cardiovascular: Negative for chest pain and leg swelling.  Gastrointestinal: Negative for abdominal pain and diarrhea.  Endocrine: Negative for polydipsia and polyphagia.  Skin: Negative for color change.  Neurological: Negative for dizziness and weakness.  Psychiatric/Behavioral: Negative for behavioral problems and confusion.       Objective:   Physical Exam  Constitutional: She appears well-nourished. No distress.  HENT:  Head: Normocephalic and atraumatic.  Eyes: Right eye exhibits no discharge. Left eye exhibits no discharge.  Neck: No tracheal deviation present.  Cardiovascular: Normal rate, regular rhythm and normal heart sounds.  No murmur heard. Pulmonary/Chest: Effort normal and breath sounds normal. No respiratory distress.  Musculoskeletal: She exhibits no edema.  Lymphadenopathy:    She has no cervical adenopathy.  Neurological: She is alert. Coordination normal.  Skin: Skin is warm and dry.  Psychiatric: She has a normal mood and affect. Her behavior is normal.  Vitals  reviewed.         Assessment & Plan:  HTN with CHF- her recent ejection fraction was good more than likely had fluid overload related to HTN related to diastolic dysfunction responding well to medicine will blood pressures are good today check lab work patient to follow-up within 2 weeks for her pain management visit.  Patient was encouraged to eat healthy minimize salt weigh herself on a regular basis and if any problems with weight going up to notify by [place] right away

## 2018-08-04 ENCOUNTER — Encounter: Payer: Self-pay | Admitting: Family Medicine

## 2018-08-04 LAB — BASIC METABOLIC PANEL
BUN/Creatinine Ratio: 16 (ref 12–28)
BUN: 20 mg/dL (ref 8–27)
CO2: 24 mmol/L (ref 20–29)
CREATININE: 1.23 mg/dL — AB (ref 0.57–1.00)
Calcium: 9 mg/dL (ref 8.7–10.3)
Chloride: 101 mmol/L (ref 96–106)
GFR, EST AFRICAN AMERICAN: 47 mL/min/{1.73_m2} — AB (ref >59)
GFR, EST NON AFRICAN AMERICAN: 41 mL/min/{1.73_m2} — AB (ref >59)
Glucose: 90 mg/dL (ref 65–99)
Potassium: 4 mmol/L (ref 3.5–5.2)
SODIUM: 140 mmol/L (ref 134–144)

## 2018-08-04 LAB — BRAIN NATRIURETIC PEPTIDE: BNP: 318.5 pg/mL — ABNORMAL HIGH (ref 0.0–100.0)

## 2018-08-09 ENCOUNTER — Telehealth: Payer: Self-pay | Admitting: Family Medicine

## 2018-08-09 MED ORDER — FUROSEMIDE 20 MG PO TABS: 20.0000 mg | ORAL_TABLET | Freq: Every day | ORAL | 3 refills | Status: DC

## 2018-08-09 NOTE — Telephone Encounter (Signed)
Medication sent to the requested pharmacy. Patient is aware.

## 2018-08-09 NOTE — Telephone Encounter (Signed)
Patient is wanting to know if she needs to continue taking lasix 20 mg on which she was prescribe when she was in the hospital. If so she will need a prescription for it she used her last pill today. She was last seen 08/01/18.[facility]

## 2018-08-09 NOTE — Telephone Encounter (Signed)
Please send in prescription Lasix 20 mg 1 each morning #30 with 3 refills

## 2018-08-09 NOTE — Telephone Encounter (Signed)
Please advise 

## 2018-08-17 ENCOUNTER — Ambulatory Visit (INDEPENDENT_AMBULATORY_CARE_PROVIDER_SITE_OTHER): Payer: PPO | Admitting: Family Medicine

## 2018-08-17 ENCOUNTER — Encounter: Payer: Self-pay | Admitting: Family Medicine

## 2018-08-17 VITALS — BP 172/88 | Ht 69.0 in | Wt 167.0 lb

## 2018-08-17 DIAGNOSIS — G894 Chronic pain syndrome: Secondary | ICD-10-CM

## 2018-08-17 DIAGNOSIS — Z79891 Long term (current) use of opiate analgesic: Secondary | ICD-10-CM | POA: Diagnosis not present

## 2018-08-17 MED ORDER — HYDROCODONE-ACETAMINOPHEN 10-325 MG PO TABS: ORAL_TABLET | ORAL | 0 refills | Status: DC

## 2018-08-17 NOTE — Progress Notes (Signed)
   Subjective:    Patient ID: Kelsey Sandoval, female    DOB: Jan 08, 1933, 84 y.o.   MRN: [id number]  HPI  This patient was seen today for chronic pain  The medication list was reviewed and updated.   -Compliance with medication: Yes  - Number patient states they take daily: 4-6 per day  -when was the last dose patient took? One this am and one at lunch today.  The patient was advised the importance of maintaining medication and not using illegal substances with these.  Here for refills and follow up  The patient was educated that we can provide 3 monthly scripts for their medication, it is their responsibility to follow the instructions.  Side effects or complications from medications: No  Patient is aware that pain medications are meant to minimize the severity of the pain to allow their pain levels to improve to allow for better function. They are aware of that pain medications cannot totally remove their pain.  Due for UDT ( at least once per year) : 11/2018  Patient states she is also here to follow up on recent hospitalization per pt and she had some fluid around her heart.   She denies any type of chest tightness pressure pain or shortness of breath She has follow-up with cardiology on 11 December  Review of Systems  Constitutional: Negative for activity change and appetite change.  HENT: Negative for congestion and rhinorrhea.   Respiratory: Negative for cough and shortness of breath.   Cardiovascular: Negative for chest pain and leg swelling.  Gastrointestinal: Negative for abdominal pain, nausea and vomiting.  Skin: Negative for color change.  Neurological: Negative for dizziness and weakness.  Psychiatric/Behavioral: Negative for agitation and confusion.       Objective:   Physical Exam  Constitutional: She appears well-nourished. No distress.  HENT:  Head: Normocephalic.  Cardiovascular: Normal rate, regular rhythm and normal heart sounds.  No murmur  heard. Pulmonary/Chest: Effort normal and breath sounds normal.  Musculoskeletal: She exhibits no edema.  Lymphadenopathy:    She has no cervical adenopathy.  Neurological: She is alert.  Psychiatric: Her behavior is normal.  Vitals reviewed.   From a cardiac standpoint patient is doing well weights are staying stable according to her no sign of CHF on examination today      Assessment & Plan:  The patient was seen in followup for chronic pain. A review over at their current pain status was discussed. Drug registry was checked. Prescriptions were given. Discussion was held regarding the importance of compliance with medication as well as pain medication contract.  Time for questions regarding pain management plan occurred. Importance of regular followup visits was discussed. Patient was informed that medication may cause drowsiness and should not be combined  with other medications/alcohol or street drugs. Patient was cautioned that medication could cause drowsiness. If the patient feels medication is causing altered alertness then do not drive or operate dangerous equipment.  Drug registry was checked 3 prescriptions given Patient to follow-up in 3 months

## 2018-08-17 NOTE — Patient Instructions (Signed)
DASH Eating Plan DASH stands for "Dietary Approaches to Stop Hypertension." The DASH eating plan is a healthy eating plan that has been shown to reduce high blood pressure (hypertension). It may also reduce your risk for type 2 diabetes, heart disease, and stroke. The DASH eating plan may also help with weight loss. What are tips for following this plan? General guidelines  Avoid eating more than 2,300 mg (milligrams) of salt (sodium) a day. If you have hypertension, you may need to reduce your sodium intake to 1,500 mg a day.  Limit alcohol intake to no more than 1 drink a day for nonpregnant women and 2 drinks a day for men. One drink equals 12 oz of beer, 5 oz of wine, or 1 oz of hard liquor.  Work with your health care provider to maintain a healthy body weight or to lose weight. Ask what an ideal weight is for you.  Get at least 30 minutes of exercise that causes your heart to beat faster (aerobic exercise) most days of the week. Activities may include walking, swimming, or biking.  Work with your health care provider or diet and nutrition specialist (dietitian) to adjust your eating plan to your individual calorie needs. Reading food labels  Check food labels for the amount of sodium per serving. Choose foods with less than 5 percent of the Daily Value of sodium. Generally, foods with less than 300 mg of sodium per serving fit into this eating plan.  To find whole grains, look for the word "whole" as the first word in the ingredient list. Shopping  Buy products labeled as "low-sodium" or "no salt added."  Buy fresh foods. Avoid canned foods and premade or frozen meals. Cooking  Avoid adding salt when cooking. Use salt-free seasonings or herbs instead of table salt or sea salt. Check with your health care provider or pharmacist before using salt substitutes.  Do not fry foods. Cook foods using healthy methods such as baking, boiling, grilling, and broiling instead.  Cook with  heart-healthy oils, such as olive, canola, soybean, or sunflower oil. Meal planning   Eat a balanced diet that includes: ? 5 or more servings of fruits and vegetables each day. At each meal, try to fill half of your plate with fruits and vegetables. ? Up to 6-8 servings of whole grains each day. ? Less than 6 oz of lean meat, poultry, or fish each day. A 3-oz serving of meat is about the same size as a deck of cards. One egg equals 1 oz. ? 2 servings of low-fat dairy each day. ? A serving of nuts, seeds, or beans 5 times each week. ? Heart-healthy fats. Healthy fats called Omega-3 fatty acids are found in foods such as flaxseeds and coldwater fish, like sardines, salmon, and mackerel.  Limit how much you eat of the following: ? Canned or prepackaged foods. ? Food that is high in trans fat, such as fried foods. ? Food that is high in saturated fat, such as fatty meat. ? Sweets, desserts, sugary drinks, and other foods with added sugar. ? Full-fat dairy products.  Do not salt foods before eating.  Try to eat at least 2 vegetarian meals each week.  Eat more home-cooked food and less restaurant, buffet, and fast food.  When eating at a restaurant, ask that your food be prepared with less salt or no salt, if possible. What foods are recommended? The items listed may not be a complete list. Talk with your dietitian about what   dietary choices are best for you. Grains Whole-grain or whole-wheat bread. Whole-grain or whole-wheat pasta. Brown rice. Oatmeal. Quinoa. Bulgur. Whole-grain and low-sodium cereals. Pita bread. Low-fat, low-sodium crackers. Whole-wheat flour tortillas. Vegetables Fresh or frozen vegetables (raw, steamed, roasted, or grilled). Low-sodium or reduced-sodium tomato and vegetable juice. Low-sodium or reduced-sodium tomato sauce and tomato paste. Low-sodium or reduced-sodium canned vegetables. Fruits All fresh, dried, or frozen fruit. Canned fruit in natural juice (without  added sugar). Meat and other protein foods Skinless chicken or turkey. Ground chicken or turkey. Pork with fat trimmed off. Fish and seafood. Egg whites. Dried beans, peas, or lentils. Unsalted nuts, nut butters, and seeds. Unsalted canned beans. Lean cuts of beef with fat trimmed off. Low-sodium, lean deli meat. Dairy Low-fat (1%) or fat-free (skim) milk. Fat-free, low-fat, or reduced-fat cheeses. Nonfat, low-sodium ricotta or cottage cheese. Low-fat or nonfat yogurt. Low-fat, low-sodium cheese. Fats and oils Soft margarine without trans fats. Vegetable oil. Low-fat, reduced-fat, or light mayonnaise and salad dressings (reduced-sodium). Canola, safflower, olive, soybean, and sunflower oils. Avocado. Seasoning and other foods Herbs. Spices. Seasoning mixes without salt. Unsalted popcorn and pretzels. Fat-free sweets. What foods are not recommended? The items listed may not be a complete list. Talk with your dietitian about what dietary choices are best for you. Grains Baked goods made with fat, such as croissants, muffins, or some breads. Dry pasta or rice meal packs. Vegetables Creamed or fried vegetables. Vegetables in a cheese sauce. Regular canned vegetables (not low-sodium or reduced-sodium). Regular canned tomato sauce and paste (not low-sodium or reduced-sodium). Regular tomato and vegetable juice (not low-sodium or reduced-sodium). Pickles. Olives. Fruits Canned fruit in a light or heavy syrup. Fried fruit. Fruit in cream or butter sauce. Meat and other protein foods Fatty cuts of meat. Ribs. Fried meat. Bacon. Sausage. Bologna and other processed lunch meats. Salami. Fatback. Hotdogs. Bratwurst. Salted nuts and seeds. Canned beans with added salt. Canned or smoked fish. Whole eggs or egg yolks. Chicken or turkey with skin. Dairy Whole or 2% milk, cream, and half-and-half. Whole or full-fat cream cheese. Whole-fat or sweetened yogurt. Full-fat cheese. Nondairy creamers. Whipped toppings.  Processed cheese and cheese spreads. Fats and oils Butter. Stick margarine. Lard. Shortening. Ghee. Bacon fat. Tropical oils, such as coconut, palm kernel, or palm oil. Seasoning and other foods Salted popcorn and pretzels. Onion salt, garlic salt, seasoned salt, table salt, and sea salt. Worcestershire sauce. Tartar sauce. Barbecue sauce. Teriyaki sauce. Soy sauce, including reduced-sodium. Steak sauce. Canned and packaged gravies. Fish sauce. Oyster sauce. Cocktail sauce. Horseradish that you find on the shelf. Ketchup. Mustard. Meat flavorings and tenderizers. Bouillon cubes. Hot sauce and Tabasco sauce. Premade or packaged marinades. Premade or packaged taco seasonings. Relishes. Regular salad dressings. Where to find more information:  National Heart, Lung, and Blood Institute: www.nhlbi.nih.gov  American Heart Association: www.heart.org Summary  The DASH eating plan is a healthy eating plan that has been shown to reduce high blood pressure (hypertension). It may also reduce your risk for type 2 diabetes, heart disease, and stroke.  With the DASH eating plan, you should limit salt (sodium) intake to 2,300 mg a day. If you have hypertension, you may need to reduce your sodium intake to 1,500 mg a day.  When on the DASH eating plan, aim to eat more fresh fruits and vegetables, whole grains, lean proteins, low-fat dairy, and heart-healthy fats.  Work with your health care provider or diet and nutrition specialist (dietitian) to adjust your eating plan to your individual   calorie needs. This information is not intended to replace advice given to you by your health care provider. Make sure you discuss any questions you have with your health care provider. Document Released: 09/10/2011 Document Revised: 09/14/2016 Document Reviewed: 09/14/2016 Elsevier Interactive Patient Education  2018 [employer]  

## 2018-08-23 ENCOUNTER — Other Ambulatory Visit: Payer: Self-pay | Admitting: Internal Medicine

## 2018-08-23 DIAGNOSIS — I480 Paroxysmal atrial fibrillation: Secondary | ICD-10-CM

## 2018-08-24 NOTE — Telephone Encounter (Signed)
Pt is a 84 yr old female who last Dr Lovena Le on 04/26/18. Weight on 08/17/18 was 75.8Kg. SCr on 08/03/18 was 1.23. CrCl is 41 mL/min. Will refill Pradaxa 150mg  BID.

## 2018-08-25 DIAGNOSIS — H33191 Other retinoschisis and retinal cysts, right eye: Secondary | ICD-10-CM | POA: Diagnosis not present

## 2018-08-25 DIAGNOSIS — H35363 Drusen (degenerative) of macula, bilateral: Secondary | ICD-10-CM | POA: Diagnosis not present

## 2018-08-25 DIAGNOSIS — H43812 Vitreous degeneration, left eye: Secondary | ICD-10-CM | POA: Diagnosis not present

## 2018-08-25 DIAGNOSIS — H59031 Cystoid macular edema following cataract surgery, right eye: Secondary | ICD-10-CM | POA: Diagnosis not present

## 2018-08-26 ENCOUNTER — Telehealth: Payer: Self-pay | Admitting: Family Medicine

## 2018-08-26 NOTE — Telephone Encounter (Signed)
Pt contacted office due to wanting to let Dr.Scott know about her weight. Pt states that yesterday she was 171 last night and this morning she was 163. Pt states she has been eating better and been in the 160s with her weight. Pt states she does not feel bad and she is only a little bit short of breath. Pt wanted to know if provider wanted to see her or if he wants to increase fluid pill. Provider not in office at time of call. Please advise. Thank you  (no same day appts available today).

## 2018-08-26 NOTE — Telephone Encounter (Signed)
I am not sure that this makes sense Being 171 pounds last night 163 pounds would be quite a drop And now she is asking whether or not to increase her fluid pill? If she would like for me to see her I could see her at 11:00 If not I at least need to know how does she feel the following are doing Shortness of breath? Does she get increased shortness of breath when she lays down? Swelling in her legs more than usual?  Less than usual? Is she weighing herself on the same scale?

## 2018-08-26 NOTE — Telephone Encounter (Signed)
Contacted patient. Pt states that she is not having any swelling in legs, only a little bit of shortness of breath when laying down. Pt states when she does something in a hurry she does get a little short of breath. Pt states she is weighing on the same scale. Spoke with provider and provider stated that he could see her this morning or Monday. Pt states she does not have a ride at the moment to see provider today so she would like to make one on Monday. Pt was transferred up front to make appt for Monday.

## 2018-08-29 ENCOUNTER — Encounter: Payer: Self-pay | Admitting: Family Medicine

## 2018-08-29 ENCOUNTER — Ambulatory Visit (INDEPENDENT_AMBULATORY_CARE_PROVIDER_SITE_OTHER): Payer: PPO | Admitting: Family Medicine

## 2018-08-29 VITALS — BP 130/70 | Ht 69.0 in | Wt 167.8 lb

## 2018-08-29 DIAGNOSIS — I509 Heart failure, unspecified: Secondary | ICD-10-CM | POA: Diagnosis not present

## 2018-08-29 DIAGNOSIS — N289 Disorder of kidney and ureter, unspecified: Secondary | ICD-10-CM | POA: Diagnosis not present

## 2018-08-29 DIAGNOSIS — I10 Essential (primary) hypertension: Secondary | ICD-10-CM

## 2018-08-29 MED ORDER — TORSEMIDE 20 MG PO TABS: ORAL_TABLET | ORAL | 4 refills | Status: DC

## 2018-08-29 NOTE — Progress Notes (Signed)
   Subjective:    Patient ID: Kelsey Sandoval, female    DOB: November 24, 1932, 84 y.o.   MRN: [id number]  Congestive Heart Failure  Presents for follow-up visit. Associated symptoms include fatigue and shortness of breath. Pertinent negatives include no chest pain.  Pt has phone message; pt called office on Friday. Patient relates that she had a swing in her weight from 171 on one evening all the way down to 163 the next morning The patient states she does get a little bit out of breath if she pushes her cell but denies any severe shortness of breath    Review of Systems  Constitutional: Positive for fatigue. Negative for activity change and fever.  HENT: Negative for congestion, ear pain and rhinorrhea.   Eyes: Negative for discharge.  Respiratory: Positive for shortness of breath. Negative for cough and wheezing.   Cardiovascular: Positive for leg swelling. Negative for chest pain.  She denies PND     Objective:   Physical Exam  Constitutional: She appears well-nourished. No distress.  HENT:  Head: Normocephalic and atraumatic.  Eyes: Right eye exhibits no discharge. Left eye exhibits no discharge.  Neck: No tracheal deviation present.  Cardiovascular: Normal rate and normal heart sounds.  No murmur heard. Pulmonary/Chest: Effort normal and breath sounds normal. No respiratory distress.  Musculoskeletal: She exhibits no edema.  Lymphadenopathy:    She has no cervical adenopathy.  Neurological: She is alert. Coordination normal.  Skin: Skin is warm and dry.  Psychiatric: She has a normal mood and affect. Her behavior is normal.  Vitals reviewed.  She does have small amount of edema in the lower legs but not severe       Assessment & Plan:  Patient is worried that she is going to develop severe CHF I encouraged her to weigh herself only in the morning wearing the same clothing when she first gets up after using the restroom If her weight goes up above 5 pounds over 3-day span  to notify us To follow-up with cardiology in December and [place] in January Change lab work to a met 7 along with a BMP before her next visit with cardiology Stop furosemide Use torsemide 20 mg each morning Follow-up sooner if problems Warning signs discussed

## 2018-08-29 NOTE — Patient Instructions (Signed)
DASH Eating Plan DASH stands for "Dietary Approaches to Stop Hypertension." The DASH eating plan is a healthy eating plan that has been shown to reduce high blood pressure (hypertension). It may also reduce your risk for type 2 diabetes, heart disease, and stroke. The DASH eating plan may also help with weight loss. What are tips for following this plan? General guidelines  Avoid eating more than 2,300 mg (milligrams) of salt (sodium) a day. If you have hypertension, you may need to reduce your sodium intake to 1,500 mg a day.  Limit alcohol intake to no more than 1 drink a day for nonpregnant women and 2 drinks a day for men. One drink equals 12 oz of beer, 5 oz of wine, or 1 oz of hard liquor.  Work with your health care provider to maintain a healthy body weight or to lose weight. Ask what an ideal weight is for you.  Get at least 30 minutes of exercise that causes your heart to beat faster (aerobic exercise) most days of the week. Activities may include walking, swimming, or biking.  Work with your health care provider or diet and nutrition specialist (dietitian) to adjust your eating plan to your individual calorie needs. Reading food labels  Check food labels for the amount of sodium per serving. Choose foods with less than 5 percent of the Daily Value of sodium. Generally, foods with less than 300 mg of sodium per serving fit into this eating plan.  To find whole grains, look for the word "whole" as the first word in the ingredient list. Shopping  Buy products labeled as "low-sodium" or "no salt added."  Buy fresh foods. Avoid canned foods and premade or frozen meals. Cooking  Avoid adding salt when cooking. Use salt-free seasonings or herbs instead of table salt or sea salt. Check with your health care provider or pharmacist before using salt substitutes.  Do not fry foods. Cook foods using healthy methods such as baking, boiling, grilling, and broiling instead.  Cook with  heart-healthy oils, such as olive, canola, soybean, or sunflower oil. Meal planning   Eat a balanced diet that includes: ? 5 or more servings of fruits and vegetables each day. At each meal, try to fill half of your plate with fruits and vegetables. ? Up to 6-8 servings of whole grains each day. ? Less than 6 oz of lean meat, poultry, or fish each day. A 3-oz serving of meat is about the same size as a deck of cards. One egg equals 1 oz. ? 2 servings of low-fat dairy each day. ? A serving of nuts, seeds, or beans 5 times each week. ? Heart-healthy fats. Healthy fats called Omega-3 fatty acids are found in foods such as flaxseeds and coldwater fish, like sardines, salmon, and mackerel.  Limit how much you eat of the following: ? Canned or prepackaged foods. ? Food that is high in trans fat, such as fried foods. ? Food that is high in saturated fat, such as fatty meat. ? Sweets, desserts, sugary drinks, and other foods with added sugar. ? Full-fat dairy products.  Do not salt foods before eating.  Try to eat at least 2 vegetarian meals each week.  Eat more home-cooked food and less restaurant, buffet, and fast food.  When eating at a restaurant, ask that your food be prepared with less salt or no salt, if possible. What foods are recommended? The items listed may not be a complete list. Talk with your dietitian about what   dietary choices are best for you. Grains Whole-grain or whole-wheat bread. Whole-grain or whole-wheat pasta. Brown rice. Oatmeal. Quinoa. Bulgur. Whole-grain and low-sodium cereals. Pita bread. Low-fat, low-sodium crackers. Whole-wheat flour tortillas. Vegetables Fresh or frozen vegetables (raw, steamed, roasted, or grilled). Low-sodium or reduced-sodium tomato and vegetable juice. Low-sodium or reduced-sodium tomato sauce and tomato paste. Low-sodium or reduced-sodium canned vegetables. Fruits All fresh, dried, or frozen fruit. Canned fruit in natural juice (without  added sugar). Meat and other protein foods Skinless chicken or turkey. Ground chicken or turkey. Pork with fat trimmed off. Fish and seafood. Egg whites. Dried beans, peas, or lentils. Unsalted nuts, nut butters, and seeds. Unsalted canned beans. Lean cuts of beef with fat trimmed off. Low-sodium, lean deli meat. Dairy Low-fat (1%) or fat-free (skim) milk. Fat-free, low-fat, or reduced-fat cheeses. Nonfat, low-sodium ricotta or cottage cheese. Low-fat or nonfat yogurt. Low-fat, low-sodium cheese. Fats and oils Soft margarine without trans fats. Vegetable oil. Low-fat, reduced-fat, or light mayonnaise and salad dressings (reduced-sodium). Canola, safflower, olive, soybean, and sunflower oils. Avocado. Seasoning and other foods Herbs. Spices. Seasoning mixes without salt. Unsalted popcorn and pretzels. Fat-free sweets. What foods are not recommended? The items listed may not be a complete list. Talk with your dietitian about what dietary choices are best for you. Grains Baked goods made with fat, such as croissants, muffins, or some breads. Dry pasta or rice meal packs. Vegetables Creamed or fried vegetables. Vegetables in a cheese sauce. Regular canned vegetables (not low-sodium or reduced-sodium). Regular canned tomato sauce and paste (not low-sodium or reduced-sodium). Regular tomato and vegetable juice (not low-sodium or reduced-sodium). Pickles. Olives. Fruits Canned fruit in a light or heavy syrup. Fried fruit. Fruit in cream or butter sauce. Meat and other protein foods Fatty cuts of meat. Ribs. Fried meat. Bacon. Sausage. Bologna and other processed lunch meats. Salami. Fatback. Hotdogs. Bratwurst. Salted nuts and seeds. Canned beans with added salt. Canned or smoked fish. Whole eggs or egg yolks. Chicken or turkey with skin. Dairy Whole or 2% milk, cream, and half-and-half. Whole or full-fat cream cheese. Whole-fat or sweetened yogurt. Full-fat cheese. Nondairy creamers. Whipped toppings.  Processed cheese and cheese spreads. Fats and oils Butter. Stick margarine. Lard. Shortening. Ghee. Bacon fat. Tropical oils, such as coconut, palm kernel, or palm oil. Seasoning and other foods Salted popcorn and pretzels. Onion salt, garlic salt, seasoned salt, table salt, and sea salt. Worcestershire sauce. Tartar sauce. Barbecue sauce. Teriyaki sauce. Soy sauce, including reduced-sodium. Steak sauce. Canned and packaged gravies. Fish sauce. Oyster sauce. Cocktail sauce. Horseradish that you find on the shelf. Ketchup. Mustard. Meat flavorings and tenderizers. Bouillon cubes. Hot sauce and Tabasco sauce. Premade or packaged marinades. Premade or packaged taco seasonings. Relishes. Regular salad dressings. Where to find more information:  National Heart, Lung, and Blood Institute: www.nhlbi.nih.gov  American Heart Association: www.heart.org Summary  The DASH eating plan is a healthy eating plan that has been shown to reduce high blood pressure (hypertension). It may also reduce your risk for type 2 diabetes, heart disease, and stroke.  With the DASH eating plan, you should limit salt (sodium) intake to 2,300 mg a day. If you have hypertension, you may need to reduce your sodium intake to 1,500 mg a day.  When on the DASH eating plan, aim to eat more fresh fruits and vegetables, whole grains, lean proteins, low-fat dairy, and heart-healthy fats.  Work with your health care provider or diet and nutrition specialist (dietitian) to adjust your eating plan to your individual   calorie needs. This information is not intended to replace advice given to you by your health care provider. Make sure you discuss any questions you have with your health care provider. Document Released: 09/10/2011 Document Revised: 09/14/2016 Document Reviewed: 09/14/2016 Elsevier Interactive Patient Education  2018 [employer]  

## 2018-09-08 DIAGNOSIS — I10 Essential (primary) hypertension: Secondary | ICD-10-CM | POA: Diagnosis not present

## 2018-09-08 DIAGNOSIS — I509 Heart failure, unspecified: Secondary | ICD-10-CM | POA: Diagnosis not present

## 2018-09-08 DIAGNOSIS — N289 Disorder of kidney and ureter, unspecified: Secondary | ICD-10-CM | POA: Diagnosis not present

## 2018-09-09 LAB — BASIC METABOLIC PANEL
BUN/Creatinine Ratio: 20 (ref 12–28)
BUN: 23 mg/dL (ref 8–27)
CALCIUM: 9.4 mg/dL (ref 8.7–10.3)
CHLORIDE: 103 mmol/L (ref 96–106)
CO2: 23 mmol/L (ref 20–29)
Creatinine, Ser: 1.15 mg/dL — ABNORMAL HIGH (ref 0.57–1.00)
GFR calc Af Amer: 51 mL/min/{1.73_m2} — ABNORMAL LOW (ref >59)
GFR calc non Af Amer: 44 mL/min/{1.73_m2} — ABNORMAL LOW (ref >59)
Glucose: 82 mg/dL (ref 65–99)
Potassium: 3.9 mmol/L (ref 3.5–5.2)
Sodium: 145 mmol/L — ABNORMAL HIGH (ref 134–144)

## 2018-09-09 LAB — BRAIN NATRIURETIC PEPTIDE: BNP: 480.4 pg/mL — ABNORMAL HIGH (ref 0.0–100.0)

## 2018-09-14 ENCOUNTER — Encounter: Payer: Self-pay | Admitting: Student

## 2018-09-14 ENCOUNTER — Ambulatory Visit (INDEPENDENT_AMBULATORY_CARE_PROVIDER_SITE_OTHER): Payer: PPO | Admitting: Student

## 2018-09-14 VITALS — BP 126/86 | HR 72 | Ht 69.0 in | Wt 168.4 lb

## 2018-09-14 DIAGNOSIS — I495 Sick sinus syndrome: Secondary | ICD-10-CM

## 2018-09-14 DIAGNOSIS — I340 Nonrheumatic mitral (valve) insufficiency: Secondary | ICD-10-CM

## 2018-09-14 DIAGNOSIS — I10 Essential (primary) hypertension: Secondary | ICD-10-CM | POA: Diagnosis not present

## 2018-09-14 DIAGNOSIS — I5189 Other ill-defined heart diseases: Secondary | ICD-10-CM

## 2018-09-14 DIAGNOSIS — I4821 Permanent atrial fibrillation: Secondary | ICD-10-CM | POA: Diagnosis not present

## 2018-09-14 DIAGNOSIS — Z79899 Other long term (current) drug therapy: Secondary | ICD-10-CM

## 2018-09-14 MED ORDER — TORSEMIDE 20 MG PO TABS: 40.0000 mg | ORAL_TABLET | Freq: Every day | ORAL | 3 refills | Status: DC

## 2018-09-14 NOTE — Patient Instructions (Signed)
Medication Instructions:  Your physician has recommended you make the following change in your medication:  Increase Torsemide to 40 mg Daily   If you need a refill on your cardiac medications before your next appointment, please call your pharmacy.   Lab work: Your physician recommends that you return for lab work in: 2 weeks   If you have labs (blood work) drawn today and your tests are completely normal, you will receive your results only by: Marland Kitchen MyChart Message (if you have MyChart) OR . A paper copy in the mail If you have any lab test that is abnormal or we need to change your treatment, we will call you to review the results.  Testing/Procedures: NONE   Follow-Up: At [facility], you and your health needs are our priority.  As part of our continuing mission to provide you with exceptional heart care, we have created designated Provider Care Teams.  These Care Teams include your primary Cardiologist (physician) and Advanced Practice Providers (APPs -  Physician Assistants and Nurse Practitioners) who all work together to provide you with the care you need, when you need it. You will need a follow up appointment in 4-6 weeks.  Please call our office 2 months in advance to schedule this appointment.  You may see Kate Sable, MD or one of the following Advanced Practice Providers on your designated Care Team:   Bernerd Pho, PA-C [facility]) . Ermalinda Barrios, PA-C ([place])  Any Other Special Instructions Will Be Listed Below (If Applicable). Thank you for choosing [facility]!

## 2018-09-14 NOTE — Progress Notes (Signed)
Cardiology Office Note    Date:  09/14/2018   ID:  Kelsey Sandoval, DOB Aug 28, 1933, MRN [id number]  PCP:  Kelsey Drown, MD  Cardiologist: Kelsey Sable, MD   EP: Dr. Lovena Sandoval  Chief Complaint  Patient presents with  . Follow-up    worsening dyspnea and lower extremity edema    History of Present Illness:    Kelsey Sandoval is a 84 y.o. female with past medical history of permanent atrial fibrillation (on Pradaxa for anticoagulation), tachy-brady syndrome (s/p Medtronic PPM placement), mitral regurgitation, HTN, and HLD who presents to the office today for evaluation of CHF.  She was last examined by Dr. Lovena Sandoval in 04/2018 and denied any recent chest pain or dyspnea on exertion at that time. BP was significantly elevated at 180/94 and she reported being anxious but given her significantly elevated readings, Amlodipine was increased to 5 mg daily.  In the interim, she was admitted to [facility] from 07/31/2018 to 08/01/2018 for evaluation of worsening dyspnea over the past several days. It was thought this was possibly due to hypertensive urgency as BP was elevated to 187/106 upon arrival. She reported having missed several days of her medications. Her initial labs showed WBC 9.0, Hgb 12.4, platelets 234, Na+ 142, K+ 4.6, and creatinine 1.00. Initial and cyclic troponin values were negative but BNP was elevated to 597. CXR showed COPD with cardiomegaly and aortic atherosclerosis and a probable small left pleural effusion. CTA was negative for a PE. EKG showed rate controlled atrial fibrillation, heart rate 79.  She was continued on Amlodipine 5 mg daily, Lisinopril 20 mg daily and Toprol-XL 50 mg twice daily with Hydralazine having been titrated from 25 mg twice daily to 25 mg 3 times daily and Lasix 20mg  daily being initiated.   Since hospital discharge, she has been evaluated by her PCP and BNP on 09/08/2018 remained elevated at 480 creatinine was stable at 1.15. Was transitioned from  Lasix to Torsemide 20mg  daily at the time of her visit with close Cardiology follow-up recommended.  In talking with the patient today, she reports continuing to have episodes of dyspnea on exertion and orthopnea since hospital discharge. Says that weight has overall been variable at  162 - 165 lbs on her home scales. Denies any recent chest pain, palpitations, lightheadedness, dizziness, or presyncope.  She initially tells me she follows a low-sodium diet but then says she has consumed soup multiple times over the past week and typically adds salt to foods. Reports BP has overall been well controlled when checked and she reports good compliance with her current medication regimen. She remains on Pradaxa for anticoagulation and denies any evidence of active bleeding.   Past Medical History:  Diagnosis Date  . Anxiety   . Arthritis   . Back pain, chronic    Sciatica  . Cataracts, bilateral   . Depression   . Dysrhythmia    AFib  . Essential hypertension   . GERD (gastroesophageal reflux disease)   . Hyperlipidemia   . Hypothyroidism   . Membranous nephropathy determined by biopsy 06/07/2015  . Nephrotic syndrome    RHUX  . Neuropathy   . Osteopenia   . Pacemaker    Medtronic  . PAF (paroxysmal atrial fibrillation) ([place])   . Pre-diabetes   . Tachycardia-bradycardia syndrome [facility])     Past Surgical History:  Procedure Laterality Date  . ABDOMINAL HYSTERECTOMY     partial-pt has no ovaries  . ANTERIOR VITRECTOMY Right 03/11/2018  Procedure: ANTERIOR VITRECTOMY;  Surgeon: Baruch Goldmann, MD;  Location: [facility] ORS;  Service: Ophthalmology;  Laterality: Right;  . APPENDECTOMY    . BACK SURGERY    . BIOPSY N/A 01/17/2014   Procedure: BIOPSY;  Surgeon: Rogene Houston, MD;  Location: [facility] ENDO SUITE;  Service: Endoscopy;  Laterality: N/A;  . CATARACT EXTRACTION W/PHACO Left 02/28/2015   Procedure: CATARACT EXTRACTION PHACO AND INTRAOCULAR LENS PLACEMENT (IOC);  Surgeon: [name], MD;   Location: [facility] ORS;  Service: Ophthalmology;  Laterality: Left;  CDE 18.71  . CATARACT EXTRACTION W/PHACO Right 03/11/2018   Procedure: CATARACT EXTRACTION PHACO  AND INTRAOCULAR LENS PLACEMENT RIGHT EYE ;  Surgeon: Baruch Goldmann, MD;  Location: [facility] ORS;  Service: Ophthalmology;  Laterality: Right;  CDE: 19.39  . COLONOSCOPY  9/05  . COLONOSCOPY N/A 10/11/2013   Procedure: COLONOSCOPY;  Surgeon: Rogene Houston, MD;  Location: [facility] SU[facility];  Service: Endoscopy;  Laterality: N/A;  1200  . ESOPHAGOGASTRODUODENOSCOPY N/A 01/17/2014   Procedure: ESOPHAGOGASTRODUODENOSCOPY (EGD);  Surgeon: Rogene Houston, MD;  Location: [facility];  Service: Endoscopy;  Laterality: N/A;  230  . ESOPHAGOGASTRODUODENOSCOPY N/A 01/02/2016   Procedure: ESOPHAGOGASTRODUODENOSCOPY (EGD);  Surgeon: Rogene Houston, MD;  Location: [facility] END[facility] SU[facility];  Service: Endoscopy;  Laterality: N/A;  240  . INSERT / REPLACE / REMOVE PACEMAKER     2012  . Wisdom tooth extracton      Current Medications: Outpatient Medications Prior to Visit  Medication Sig Dispense Refill  . acetaminophen (TYLENOL) 650 MG CR tablet Take 1,300 mg by mouth every 8 (eight) hours as needed for pain.    Marland Kitchen albuterol (PROVENTIL HFA;VENTOLIN HFA) 108 (90 Base) MCG/ACT inhaler Inhale 2 puffs into the lungs every 6 (six) hours as needed for wheezing or shortness of breath. 18 g 2  . ALPRAZolam (XANAX) 0.5 MG tablet Take 1 tablet (0.5 mg total) by mouth daily as needed for anxiety. Take one qhs (Patient taking differently: Take 0.5 mg by mouth at bedtime. Take one qhs) 30 tablet 2  . amLODipine (NORVASC) 5 MG tablet Take 1 tablet (5 mg total) by mouth daily. 90 tablet 3  . budesonide-formoterol (SYMBICORT) 80-4.5 MCG/ACT inhaler Inhale 2 puffs into the lungs every morning. 1 Inhaler 12  . busPIRone (BUSPAR) 10 MG tablet Take 1 tablet (10 mg total) by mouth 3 (three) times daily. 270 tablet 2  . DULoxetine (CYMBALTA) 60 MG capsule Take 1 capsule (60 mg total) by mouth 2  (two) times daily. 180 capsule 2  . gabapentin (NEURONTIN) 300 MG capsule TAKE 1 CAPSULE BY MOUTH IN THE MORNING, 1 CAPSULE IN THE AFTERNOON, AND 2 CAPSULES IN THE EVENING (Patient taking differently: Take 300 mg by mouth 3 (three) times daily. ) 120 capsule 5  . HYDROcodone-acetaminophen (NORCO) 10-325 MG tablet Take one tablet every 4 hours prn pain. Max 6 tablets per day 180 tablet 0  . HYDROcodone-acetaminophen (NORCO) 10-325 MG tablet 1 q4 hours prn 180 tablet 0  . HYDROcodone-acetaminophen (NORCO) 10-325 MG tablet 1 q4 hours prn pain 180 tablet 0  . hydrOXYzine (ATARAX/VISTARIL) 10 MG tablet One bid prn nausea, caution drowsiness (Patient taking differently: Take 10 mg by mouth 2 (two) times daily as needed for nausea or vomiting. ) 40 tablet 2  . levothyroxine (SYNTHROID, LEVOTHROID) 50 MCG tablet TAKE 1 TABLET BY MOUTH ONCE DAILY (Patient taking differently: Take 50 mcg by mouth daily before breakfast. ) 90 tablet 1  . lisinopril (PRINIVIL,ZESTRIL) 20 MG tablet Take  1 tablet (20 mg total) by mouth daily. 90 tablet 1  . metoprolol succinate (TOPROL-XL) 50 MG 24 hr tablet TAKE 1 TABLET BY MOUTH TWICE DAILY. TAKE WITH OR IMMEDIATELY FOLLOWING A MEAL. (Patient taking differently: Take 50 mg by mouth 2 (two) times daily. TAKE WITH OR IMMEDIATELY FOLLOWING A MEAL.) 180 tablet 0  . mirtazapine (REMERON) 15 MG tablet Take 1 tablet (15 mg total) by mouth at bedtime. (Patient taking differently: Take 7.5-15 mg by mouth at bedtime as needed (FOR SLEEP). ) 90 tablet 2  . pantoprazole (PROTONIX) 40 MG tablet Take 1 tablet (40 mg total) by mouth daily. (Patient taking differently: Take 40 mg by mouth every morning. ) 90 tablet 1  . PRADAXA 150 MG CAPS capsule TAKE 1 CAPSULE BY MOUTH TWICE DAILY 180 capsule 2  . pravastatin (PRAVACHOL) 80 MG tablet TAKE 1 TABLET BY MOUTH ONCE DAILY (Patient taking differently: Take 80 mg by mouth every evening. ) 90 tablet 1  . traZODone (DESYREL) 50 MG tablet 1/2 tablet to 1  qhs prn 30 tablet 5  . vitamin B-12 (CYANOCOBALAMIN) 1000 MCG tablet Take 1 tablet (1,000 mcg total) by mouth daily. (Patient taking differently: Take 1,000 mcg by mouth every morning. )    . torsemide (DEMADEX) 20 MG tablet 1 q a.m. 30 tablet 4  . hydrALAZINE (APRESOLINE) 25 MG tablet Take 1 tablet (25 mg total) by mouth every 8 (eight) hours. 90 tablet 0   No facility-administered medications prior to visit.      Allergies:   Penicillins; Levaquin [levofloxacin]; Sulfa antibiotics; and Zithromax [azithromycin]   Social History   Socioeconomic History  . Marital status: Divorced    Spouse name: Not on file  . Number of children: Not on file  . Years of education: Not on file  . Highest education level: Not on file  Occupational History  . Occupation: retired  Scientific laboratory technician  . Financial resource strain: Not on file  . Food insecurity:    Worry: Not on file    Inability: Not on file  . Transportation needs:    Medical: Not on file    Non-medical: Not on file  Tobacco Use  . Smoking status: Never Smoker  . Smokeless tobacco: Never Used  Substance and Sexual Activity  . Alcohol use: No    Alcohol/week: 0.0 standard drinks  . Drug use: No  . Sexual activity: Not Currently    Birth control/protection: None  Lifestyle  . Physical activity:    Days per week: Not on file    Minutes per session: Not on file  . Stress: Not on file  Relationships  . Social connections:    Talks on phone: Not on file    Gets together: Not on file    Attends religious service: Not on file    Active member of club or organization: Not on file    Attends meetings of clubs or organizations: Not on file    Relationship status: Not on file  Other Topics Concern  . Not on file  Social History Narrative  . Not on file     Family History:  The patient's family history includes Anxiety disorder in her sister and sister; Colon cancer in her brother; Depression in her sister and sister.   Review of  Systems:   Please see the history of present illness.     General:  No chills, fever, night sweats or weight changes.  Cardiovascular:  No chest pain, edema, palpitations,  paroxysmal nocturnal dyspnea. Positive for dyspnea on exertion and orthopnea.  Dermatological: No rash, lesions/masses Respiratory: No cough, dyspnea Urologic: No hematuria, dysuria Abdominal:   No nausea, vomiting, diarrhea, bright red blood per rectum, melena, or hematemesis Neurologic:  No visual changes, wkns, changes in mental status. All other systems reviewed and are otherwise negative except as noted above.   Physical Exam:    VS:  BP 126/86   Pulse 72   Ht 5\' 9"  (1.753 m)   Wt 168 lb 6.4 oz (76.4 kg)   SpO2 97%   BMI 24.87 kg/m    General: Well developed, well nourished Caucasian female appearing in no acute distress. Head: Normocephalic, atraumatic, sclera non-icteric, no xanthomas, nares are without discharge.  Neck: No carotid bruits. JVD not elevated.  Lungs: Respirations regular and unlabored, mild rales along left base.  Heart: Irregularly irregular. No S3 or S4.  No rubs or gallops appreciated. 2/6 holosystolic murmur along Apex.  Abdomen: Soft, non-tender, non-distended with normoactive bowel sounds. No hepatomegaly. No rebound/guarding. No obvious abdominal masses. Msk:  Strength and tone appear normal for age. No joint deformities or effusions. Extremities: No clubbing or cyanosis. Trace ankle edema.  Distal pedal pulses are 2+ bilaterally. Neuro: Alert and oriented X 3. Moves all extremities spontaneously. No focal deficits noted. Psych:  Responds to questions appropriately with a normal affect. Skin: No rashes or lesions noted  Wt Readings from Last 3 Encounters:  09/14/18 168 lb 6.4 oz (76.4 kg)  08/29/18 167 lb 12.8 oz (76.1 kg)  08/17/18 167 lb 0.4 oz (75.8 kg)     Studies/Labs Reviewed:   EKG:  EKG is not ordered today.   Recent Labs: 02/16/2018: TSH 3.140 08/01/2018: ALT 11;  Hemoglobin 11.9; Platelets 231 09/08/2018: BNP 480.4; BUN 23; Creatinine, Ser 1.15; Potassium 3.9; Sodium 145   Lipid Panel    Component Value Date/Time   CHOL 187 11/25/2015 0955   TRIG 209 (H) 11/25/2015 0955   HDL 58 11/25/2015 0955   CHOLHDL 3.2 11/25/2015 0955   CHOLHDL 3.6 10/08/2014 0940   VLDL 47 (H) 10/08/2014 0940   LDLCALC 87 11/25/2015 0955    Additional studies/ records that were reviewed today include:   Echocardiogram: 02/2018 Study Conclusions  - Left ventricle: The cavity size was normal. There was mild   concentric hypertrophy. Systolic function was normal. The   estimated ejection fraction was in the range of 60% to 65%. Wall   motion was normal; there were no regional wall motion   abnormalities. The study was not technically sufficient to allow   evaluation of LV diastolic dysfunction due to atrial   fibrillation. - Aortic valve: Mildly calcified annulus. Trileaflet. There was   trivial regurgitation. - Mitral valve: Mildly calcified annulus. There was moderate to   severe regurgitation. - Left atrium: The atrium was severely dilated. - Right ventricle: Pacer wire or catheter noted in right ventricle. - Right atrium: The atrium was mildly dilated. Pacer wire or   catheter noted in right atrium. - Tricuspid valve: There was moderate regurgitation. - Pulmonary arteries: PA peak pressure: 47 mm Hg (S).  Assessment:    1. Diastolic dysfunction   2. Mitral valve insufficiency, unspecified etiology   3. Permanent atrial fibrillation   4. Tachycardia-bradycardia syndrome ([place])   5. Essential hypertension   6. Medication management      Plan:   In order of problems listed above:  1. Diastolic Dysfunction - She was recently admitted for evaluation of worsening  dyspnea thought to be due to hypertensive urgency but BNP was elevated to 597 and has remained elevated when checked in the ambulatory setting. She reports having dyspnea on exertion and  orthopnea with an associated weight gain over the past few weeks. She has noticed some improvement in her symptoms since being switched from Lasix to Torsemide by her PCP but her respiratory status is not back to baseline and BNP remains elevated to > 400. She also has mild rales on examination today. Will plan to further titrate Torsemide to 40 mg daily with a repeat BMET in 2 weeks. I suspect dietary noncompliance is playing a large role in her symptoms as she reports consuming soups regularly and adding salt to most meals. We reviewed the importance of sodium restriction. If her symptoms do not improve despite dietary compliance and medication titration, would consider a repeat limited echocardiogram for reassessment of EF and her mitral valve.  2. Mitral Regurgitation - Echocardiogram in 02/2018 showed moderate to severe mitral regurgitation. The patient reports she was unaware of these findings. This could possibly be playing a role in her symptoms but will try medication management initially as outlined above with plans for a repeat echo if symptoms persist. If her MR has progressed, she might be a candidate for MitraClip.   3. Permanent Atrial Fibrillation - She denies any recent palpitations and reports her heart rate has been well controlled in the 70's to 90's when checked in the ambulatory setting. Continue Toprol-XL 50 mg twice daily for rate control. - Denies any evidence of active bleeding. Remains on Pradaxa for anticoagulation.  4. Tachy-brady syndrome  - s/p Medtronic PPM placement which is followed by Dr. Lovena Sandoval. Most recent interrogation in 04/2018 showed normal device function.  5. HTN - BP is well controlled at 126/86 during today's visit. Continue Amlodipine, Hydralazine, Lisinopril, and Toprol-XL at current dosing.   Medication Adjustments/Labs and Tests Ordered: Current medicines are reviewed at length with the patient today.  Concerns regarding medicines are outlined above.   Medication changes, Labs and Tests ordered today are listed in the Patient Instructions below. Patient Instructions  Medication Instructions:  Your physician has recommended you make the following change in your medication:  Increase Torsemide to 40 mg Daily   If you need a refill on your cardiac medications before your next appointment, please call your pharmacy.   Lab work: Your physician recommends that you return for lab work in: 2 weeks   If you have labs (blood work) drawn today and your tests are completely normal, you will receive your results only by: Marland Kitchen MyChart Message (if you have MyChart) OR . A paper copy in the mail If you have any lab test that is abnormal or we need to change your treatment, we will call you to review the results.  Testing/Procedures: NONE   Follow-Up: At [facility], you and your health needs are our priority.  As part of our continuing mission to provide you with exceptional heart care, we have created designated Provider Care Teams.  These Care Teams include your primary Cardiologist (physician) and Advanced Practice Providers (APPs -  Physician Assistants and Nurse Practitioners) who all work together to provide you with the care you need, when you need it. You will need a follow up appointment in 4-6 weeks.  Please call our office 2 months in advance to schedule this appointment.  You may see Kelsey Sable, MD or one of the following Advanced Practice Providers on your designated  Care Team:   Bernerd Pho, PA-C ([place]) . Ermalinda Barrios, PA-C ([place])  Any Other Special Instructions Will Be Listed Below (If Applicable). Thank you for choosing [facility]!     Signed, Erma Heritage, PA-C  09/14/2018 5:48 PM    [place] [address] [place], [place] [address] Phone: [phone]

## 2018-10-03 ENCOUNTER — Other Ambulatory Visit: Payer: Self-pay | Admitting: Physician Assistant

## 2018-10-03 ENCOUNTER — Other Ambulatory Visit: Payer: Self-pay | Admitting: Family Medicine

## 2018-10-03 DIAGNOSIS — Z79899 Other long term (current) drug therapy: Secondary | ICD-10-CM | POA: Diagnosis not present

## 2018-10-04 ENCOUNTER — Other Ambulatory Visit: Payer: Self-pay | Admitting: Family Medicine

## 2018-10-04 LAB — BASIC METABOLIC PANEL
BUN/Creatinine Ratio: 20 (ref 12–28)
BUN: 32 mg/dL — AB (ref 8–27)
CO2: 21 mmol/L (ref 20–29)
Calcium: 9.3 mg/dL (ref 8.7–10.3)
Chloride: 104 mmol/L (ref 96–106)
Creatinine, Ser: 1.57 mg/dL — ABNORMAL HIGH (ref 0.57–1.00)
GFR, EST AFRICAN AMERICAN: 35 mL/min/{1.73_m2} — AB (ref >59)
GFR, EST NON AFRICAN AMERICAN: 30 mL/min/{1.73_m2} — AB (ref >59)
Glucose: 80 mg/dL (ref 65–99)
Potassium: 4.4 mmol/L (ref 3.5–5.2)
Sodium: 144 mmol/L (ref 134–144)

## 2018-10-04 LAB — SPECIMEN STATUS REPORT

## 2018-10-06 ENCOUNTER — Telehealth: Payer: Self-pay | Admitting: *Deleted

## 2018-10-06 NOTE — Telephone Encounter (Signed)
Called patient with test results. No answer. Left message to call back.  

## 2018-10-06 NOTE — Telephone Encounter (Signed)
-----   Message from Erma Heritage, [place] sent at 10/06/2018  8:59 AM EST ----- Please let the patient know her kidney function has trended upwards with creatinine going from 1.15 to 1.57. If breathing has significantly improved, would reduce Torsemide back to 20mg  daily. If still having dyspnea, would reduce to 20mg  daily alternating with 40mg  daily. Will need a repeat BMET in 2 weeks. Please forward a copy of labs to Kathyrn Drown, MD.

## 2018-10-10 ENCOUNTER — Telehealth: Payer: Self-pay | Admitting: *Deleted

## 2018-10-10 ENCOUNTER — Encounter: Payer: Self-pay | Admitting: Family Medicine

## 2018-10-10 ENCOUNTER — Ambulatory Visit (INDEPENDENT_AMBULATORY_CARE_PROVIDER_SITE_OTHER): Payer: PPO | Admitting: Family Medicine

## 2018-10-10 VITALS — BP 130/72 | Ht 69.0 in | Wt 170.0 lb

## 2018-10-10 DIAGNOSIS — N289 Disorder of kidney and ureter, unspecified: Secondary | ICD-10-CM | POA: Diagnosis not present

## 2018-10-10 DIAGNOSIS — N183 Chronic kidney disease, stage 3 unspecified: Secondary | ICD-10-CM

## 2018-10-10 DIAGNOSIS — L989 Disorder of the skin and subcutaneous tissue, unspecified: Secondary | ICD-10-CM | POA: Diagnosis not present

## 2018-10-10 DIAGNOSIS — G894 Chronic pain syndrome: Secondary | ICD-10-CM | POA: Diagnosis not present

## 2018-10-10 DIAGNOSIS — J449 Chronic obstructive pulmonary disease, unspecified: Secondary | ICD-10-CM

## 2018-10-10 DIAGNOSIS — I700 Atherosclerosis of aorta: Secondary | ICD-10-CM

## 2018-10-10 DIAGNOSIS — J439 Emphysema, unspecified: Secondary | ICD-10-CM | POA: Diagnosis not present

## 2018-10-10 DIAGNOSIS — I4891 Unspecified atrial fibrillation: Secondary | ICD-10-CM

## 2018-10-10 DIAGNOSIS — Z79899 Other long term (current) drug therapy: Secondary | ICD-10-CM

## 2018-10-10 HISTORY — DX: Atherosclerosis of aorta: I70.0

## 2018-10-10 HISTORY — DX: Chronic obstructive pulmonary disease, unspecified: J44.9

## 2018-10-10 MED ORDER — HYDROCODONE-ACETAMINOPHEN 10-325 MG PO TABS: ORAL_TABLET | ORAL | 0 refills | Status: DC

## 2018-10-10 MED ORDER — TORSEMIDE 20 MG PO TABS: ORAL_TABLET | ORAL | 3 refills | Status: DC

## 2018-10-10 MED ORDER — BUDESONIDE-FORMOTEROL FUMARATE 80-4.5 MCG/ACT IN AERO: 2.0000 | INHALATION_SPRAY | Freq: Two times a day (BID) | RESPIRATORY_TRACT | 12 refills | Status: DC

## 2018-10-10 NOTE — Telephone Encounter (Signed)
-----   Message from Erma Heritage, [place] sent at 10/06/2018  8:59 AM EST ----- Please let the patient know her kidney function has trended upwards with creatinine going from 1.15 to 1.57. If breathing has significantly improved, would reduce Torsemide back to 20mg  daily. If still having dyspnea, would reduce to 20mg  daily alternating with 40mg  daily. Will need a repeat BMET in 2 weeks. Please forward a copy of labs to Kathyrn Drown, MD.

## 2018-10-10 NOTE — Progress Notes (Signed)
Subjective:    Patient ID: Kelsey Sandoval, female    DOB: 1933/01/05, 85 y.o.   MRN: [id number]  HPI Patient is here today to follow up on fluid retention. She states she still does not feel she is getting enough of the fluid off,and still having some sob with exertion. She is taking Torsemide 40 mg per day. She states the heart Dr has asked that she start taking it 20 mg alt with 40 mg the next.She will start this soon.Patient states she does not have any problems with swelling/edema in ankles,legs,arms,hands.  Patient states she has ran out of her Symbicort early and will need another prescription.  She does have an area on her left upper chest that she has been picking it and it is been bleeding some but she denies any other trouble with it is been going on for quite some time  Patient has atrial fibrillation and takes medication tries to keep things under control has not had any flareups recently in terms of shortness of breath chest tightness pressure pain  Patient had recent elevation of her creatinine with her recent lab test today decreased her diuretic and they will have her recheck her met 7 again in approximately 2 weeks  Review of Systems  Constitutional: Negative for activity change, appetite change and fatigue.  HENT: Negative for congestion and rhinorrhea.   Respiratory: Negative for cough and shortness of breath.   Cardiovascular: Negative for chest pain and leg swelling.  Gastrointestinal: Negative for abdominal pain and diarrhea.  Endocrine: Negative for polydipsia and polyphagia.  Skin: Negative for color change.  Neurological: Negative for dizziness and weakness.  Psychiatric/Behavioral: Negative for behavioral problems and confusion.       Objective:   Physical Exam Vitals signs reviewed.  Constitutional:      General: She is not in acute distress. HENT:     Head: Normocephalic and atraumatic.  Eyes:     General:        Right eye: No discharge.        Left  eye: No discharge.  Neck:     Trachea: No tracheal deviation.  Cardiovascular:     Rate and Rhythm: Normal rate and regular rhythm.     Heart sounds: Normal heart sounds. No murmur.  Pulmonary:     Effort: Pulmonary effort is normal. No respiratory distress.     Breath sounds: Normal breath sounds.  Lymphadenopathy:     Cervical: No cervical adenopathy.  Skin:    General: Skin is warm and dry.  Neurological:     Mental Status: She is alert.     Coordination: Coordination normal.  Psychiatric:        Behavior: Behavior normal.    Skin lesion left upper chest excoriated needs to see dermatology       Assessment & Plan:  Skin lesion-referral to Dr. Rozann Lesches for further evaluation  Atrial fib rate controlled on medication tolerating well  Significant chronic kidney disease stage III creatinine elevated recommend the decreased diuretic the cardiologist has her on 2 on 1 day followed by 1 the following day they will recheck a met 7 in approximately 2 weeks  Aortic atherosclerosis very important to keep cholesterol under control previous labs reviewed new labs will be ordered on next visit  Chronic pain The patient was seen in followup for chronic pain. A review over at their current pain status was discussed. Drug registry was checked. Prescriptions were given. Discussion was held regarding the  importance of compliance with medication as well as pain medication contract.  Time for questions regarding pain management plan occurred. Importance of regular followup visits was discussed. Patient was informed that medication may cause drowsiness and should not be combined  with other medications/alcohol or street drugs. Patient was cautioned that medication could cause drowsiness. If the patient feels medication is causing altered alertness then do not drive or operate dangerous equipment.  COPD Symbicort doing well with this.  Continue the Symbicort.  She is to use it twice  daily  25 minutes was spent with the patient.  This statement verifies that 25 minutes was indeed spent with the patient.  More than 50% of this visit-total duration of the visit-was spent in counseling and coordination of care. The issues that the patient came in for today as reflected in the diagnosis (s) please refer to documentation for further details.

## 2018-10-11 ENCOUNTER — Telehealth: Payer: Self-pay

## 2018-10-11 NOTE — Addendum Note (Signed)
Addended by: Karle Barr on: 10/11/2018 09:40 AM   Modules accepted: Orders

## 2018-10-11 NOTE — Telephone Encounter (Signed)
Discussed with pt. Pt verbalized understanding.  °

## 2018-10-11 NOTE — Telephone Encounter (Signed)
Patient is a excellent question Please reassure the patient that previously just several weeks ago her kidney functions were much better They are not in a dangerous range right now but they indicated that the diuretic was too strong so therefore the dose was adjusted When she does a follow-up kidney function test in a couple weeks if this shows a significant improvement then there is no need to see nephrology On the other hand if we are not seeing significant improvement then we can help set her up with a nephrologist as well Please discuss this with patient make sure she is comfortable with our approach thank you

## 2018-10-11 NOTE — Progress Notes (Signed)
Patient is aware of all and placed in Epic.

## 2018-10-11 NOTE — Telephone Encounter (Signed)
Patient wants to know if she should see a Nephrologist.

## 2018-10-17 ENCOUNTER — Encounter: Payer: Self-pay | Admitting: Family Medicine

## 2018-10-17 DIAGNOSIS — M170 Bilateral primary osteoarthritis of knee: Secondary | ICD-10-CM | POA: Diagnosis not present

## 2018-10-19 ENCOUNTER — Other Ambulatory Visit (HOSPITAL_COMMUNITY)
Admission: RE | Admit: 2018-10-19 | Discharge: 2018-10-19 | Disposition: A | Discharge status: Home / Self Care | Payer: PPO | Source: Ambulatory Visit | Attending: Student | Admitting: Student

## 2018-10-19 ENCOUNTER — Ambulatory Visit (INDEPENDENT_AMBULATORY_CARE_PROVIDER_SITE_OTHER): Payer: PPO | Admitting: Student

## 2018-10-19 ENCOUNTER — Encounter: Payer: Self-pay | Admitting: Student

## 2018-10-19 VITALS — BP 144/81 | HR 87 | Ht 69.0 in | Wt 173.2 lb

## 2018-10-19 DIAGNOSIS — R0609 Other forms of dyspnea: Secondary | ICD-10-CM

## 2018-10-19 DIAGNOSIS — I10 Essential (primary) hypertension: Secondary | ICD-10-CM

## 2018-10-19 DIAGNOSIS — Z79899 Other long term (current) drug therapy: Secondary | ICD-10-CM | POA: Diagnosis not present

## 2018-10-19 DIAGNOSIS — I340 Nonrheumatic mitral (valve) insufficiency: Secondary | ICD-10-CM | POA: Diagnosis not present

## 2018-10-19 DIAGNOSIS — I4821 Permanent atrial fibrillation: Secondary | ICD-10-CM

## 2018-10-19 DIAGNOSIS — I495 Sick sinus syndrome: Secondary | ICD-10-CM

## 2018-10-19 DIAGNOSIS — I5189 Other ill-defined heart diseases: Secondary | ICD-10-CM | POA: Diagnosis not present

## 2018-10-19 LAB — BASIC METABOLIC PANEL
ANION GAP: 10 (ref 5–15)
BUN: 43 mg/dL — ABNORMAL HIGH (ref 8–23)
CO2: 25 mmol/L (ref 22–32)
Calcium: 8.7 mg/dL — ABNORMAL LOW (ref 8.9–10.3)
Chloride: 102 mmol/L (ref 98–111)
Creatinine, Ser: 1.46 mg/dL — ABNORMAL HIGH (ref 0.44–1.00)
GFR calc Af Amer: 38 mL/min — ABNORMAL LOW (ref >60)
GFR calc non Af Amer: 33 mL/min — ABNORMAL LOW (ref >60)
Glucose, Bld: 85 mg/dL (ref 70–99)
Potassium: 4.1 mmol/L (ref 3.5–5.1)
Sodium: 137 mmol/L (ref 135–145)

## 2018-10-19 LAB — BRAIN NATRIURETIC PEPTIDE: B Natriuretic Peptide: 519 pg/mL — ABNORMAL HIGH (ref 0.0–100.0)

## 2018-10-19 MED ORDER — TORSEMIDE 20 MG PO TABS: 40.0000 mg | ORAL_TABLET | Freq: Every day | ORAL | 3 refills | Status: DC

## 2018-10-19 NOTE — Progress Notes (Signed)
Cardiology Office Note    Date:  10/19/2018   ID:  KA BENCH, DOB 30-Nov-1932, MRN [id number]  PCP:  Kathyrn Drown, MD  Cardiologist: Kate Sable, MD   EP: Dr. Lovena Le  Chief Complaint  Patient presents with  . Follow-up    4 week visit    History of Present Illness:    Kelsey Sandoval is a 85 y.o. female with past medical history of permanent atrial fibrillation (on Pradaxa for anticoagulation), chronic diastolic CHF, tachy-brady syndrome (s/p Medtronic PPM placement), mitral regurgitation, HTN, HLD, and Stage 3 CKD who presents to the office today for 4-week follow-up.  She was last examined by myself on 09/14/2018 and reported worsening episodes of dyspnea on exertion and orthopnea since hospital discharge in 07/2018. She was consuming a high sodium diet at the time of her visit. Torsemide was titrated to 40 mg daily at the time of her visit and a repeat BMET was obtained in 2 weeks which showed creatinine had trended upwards from 1.15 to 1.57.  It was recommended she reduce her Torsemide back to 20 mg daily alternating with 40mg  daily.  In talking with the patient and her son today, she reports that her dyspnea initially started to improve after Torsemide was increased to 40 mg daily but has since worsened with dose reduction following recent labs. She reports having progressive dyspnea on exertion and orthopnea. Says that her baseline weight was typically between 163 - 165 lbs but has now increased to 170 lbs on her home scales. She denies any recent chest pain, palpitations, dizziness, or presyncope.  She is still consuming a high-sodium diet including frozen meals and soup. Does not routinely cook due to living by herself.   Past Medical History:  Diagnosis Date  . Anxiety   . Aortic atherosclerosis ([place]) 10/10/2018  . Arthritis   . Back pain, chronic    Sciatica  . Cataracts, bilateral   . COPD (chronic obstructive pulmonary disease) ([place]) 10/10/2018  . Depression    . Dysrhythmia    AFib  . Essential hypertension   . GERD (gastroesophageal reflux disease)   . Hyperlipidemia   . Hypothyroidism   . Membranous nephropathy determined by biopsy 06/07/2015  . Nephrotic syndrome    RHUX  . Neuropathy   . Osteopenia   . Pacemaker    Medtronic  . PAF (paroxysmal atrial fibrillation) ([place])   . Pre-diabetes   . Tachycardia-bradycardia syndrome [facility])     Past Surgical History:  Procedure Laterality Date  . ABDOMINAL HYSTERECTOMY     partial-pt has no ovaries  . ANTERIOR VITRECTOMY Right 03/11/2018   Procedure: ANTERIOR VITRECTOMY;  Surgeon: Baruch Goldmann, MD;  Location: [facility] ORS;  Service: Ophthalmology;  Laterality: Right;  . APPENDECTOMY    . BACK SURGERY    . BIOPSY N/A 01/17/2014   Procedure: BIOPSY;  Surgeon: Rogene Houston, MD;  Location: [facility] ENDO SUITE;  Service: Endoscopy;  Laterality: N/A;  . CATARACT EXTRACTION W/PHACO Left 02/28/2015   Procedure: CATARACT EXTRACTION PHACO AND INTRAOCULAR LENS PLACEMENT (IOC);  Surgeon: [name], MD;  Location: [facility] ORS;  Service: Ophthalmology;  Laterality: Left;  CDE 18.71  . CATARACT EXTRACTION W/PHACO Right 03/11/2018   Procedure: CATARACT EXTRACTION PHACO  AND INTRAOCULAR LENS PLACEMENT RIGHT EYE ;  Surgeon: Baruch Goldmann, MD;  Location: [facility] ORS;  Service: Ophthalmology;  Laterality: Right;  CDE: 19.39  . COLONOSCOPY  9/05  . COLONOSCOPY N/A 10/11/2013   Procedure: COLONOSCOPY;  Surgeon: Bernadene Person  Gloriann Loan, MD;  Location: [facility];  Service: Endoscopy;  Laterality: N/A;  1200  . ESOPHAGOGASTRODUODENOSCOPY N/A 01/17/2014   Procedure: ESOPHAGOGASTRODUODENOSCOPY (EGD);  Surgeon: Rogene Houston, MD;  Location: [facility];  Service: Endoscopy;  Laterality: N/A;  230  . ESOPHAGOGASTRODUODENOSCOPY N/A 01/02/2016   Procedure: ESOPHAGOGASTRODUODENOSCOPY (EGD);  Surgeon: Rogene Houston, MD;  Location: [facility];  Service: Endoscopy;  Laterality: N/A;  240  . INSERT / REPLACE / REMOVE PACEMAKER     2012  .  Wisdom tooth extracton      Current Medications: Outpatient Medications Prior to Visit  Medication Sig Dispense Refill  . acetaminophen (TYLENOL) 650 MG CR tablet Take 1,300 mg by mouth every 8 (eight) hours as needed for pain.    Marland Kitchen ALPRAZolam (XANAX) 0.5 MG tablet Take 1 tablet (0.5 mg total) by mouth daily as needed for anxiety. Take one qhs (Patient taking differently: Take 0.5 mg by mouth at bedtime. Take one qhs) 30 tablet 2  . amLODipine (NORVASC) 5 MG tablet Take 1 tablet (5 mg total) by mouth daily. 90 tablet 3  . budesonide-formoterol (SYMBICORT) 80-4.5 MCG/ACT inhaler Inhale 2 puffs into the lungs 2 (two) times daily. 1 Inhaler 12  . busPIRone (BUSPAR) 10 MG tablet Take 1 tablet (10 mg total) by mouth 3 (three) times daily. 270 tablet 2  . DULoxetine (CYMBALTA) 60 MG capsule Take 1 capsule (60 mg total) by mouth 2 (two) times daily. 180 capsule 2  . gabapentin (NEURONTIN) 300 MG capsule TAKE 1 CAPSULE BY MOUTH IN THE MORNING, 1 CAPSULE IN THE AFTERNOON, AND 2 CAPSULES IN THE EVENING (Patient taking differently: Take 300 mg by mouth 3 (three) times daily. ) 120 capsule 5  . hydrALAZINE (APRESOLINE) 25 MG tablet TAKE 1 TABLET BY MOUTH EVERY 8 HOURS 90 tablet 1  . HYDROcodone-acetaminophen (NORCO) 10-325 MG tablet Take one tablet every 4 hours prn pain. Max 6 tablets per day 180 tablet 0  . HYDROcodone-acetaminophen (NORCO) 10-325 MG tablet 1 q4 hours prn 180 tablet 0  . HYDROcodone-acetaminophen (NORCO) 10-325 MG tablet 1 q4 hours prn pain 180 tablet 0  . hydrOXYzine (ATARAX/VISTARIL) 10 MG tablet One bid prn nausea, caution drowsiness (Patient taking differently: Take 10 mg by mouth 2 (two) times daily as needed for nausea or vomiting. ) 40 tablet 2  . levothyroxine (SYNTHROID, LEVOTHROID) 50 MCG tablet TAKE 1 TABLET BY MOUTH ONCE DAILY (Patient taking differently: Take 50 mcg by mouth daily before breakfast. ) 90 tablet 1  . lisinopril (PRINIVIL,ZESTRIL) 20 MG tablet TAKE 1 TABLET BY  MOUTH ONCE DAILY 90 tablet 1  . metoprolol succinate (TOPROL-XL) 50 MG 24 hr tablet TAKE 1 TABLET BY MOUTH TWICE DAILY. TAKE WITH OR IMMEDIATELY FOLLOWING A MEAL. (Patient taking differently: Take 50 mg by mouth 2 (two) times daily. TAKE WITH OR IMMEDIATELY FOLLOWING A MEAL.) 180 tablet 0  . mirtazapine (REMERON) 15 MG tablet Take 1 tablet (15 mg total) by mouth at bedtime. (Patient taking differently: Take 7.5-15 mg by mouth at bedtime as needed (FOR SLEEP). ) 90 tablet 2  . pantoprazole (PROTONIX) 40 MG tablet Take 1 tablet (40 mg total) by mouth daily. (Patient taking differently: Take 40 mg by mouth every morning. ) 90 tablet 1  . PRADAXA 150 MG CAPS capsule TAKE 1 CAPSULE BY MOUTH TWICE DAILY 180 capsule 2  . pravastatin (PRAVACHOL) 80 MG tablet TAKE 1 TABLET BY MOUTH ONCE DAILY (Patient taking differently: Take 80 mg by mouth  every evening. ) 90 tablet 1  . PROAIR HFA 108 (90 Base) MCG/ACT inhaler INHALE 2 PUFFS BY MOUTH EVERY 6 HOURS AS NEEDED FOR WHEEZING OR SHORTNESS OF BREATH 9 g 0  . traZODone (DESYREL) 50 MG tablet 1/2 tablet to 1 qhs prn 30 tablet 5  . vitamin B-12 (CYANOCOBALAMIN) 1000 MCG tablet Take 1 tablet (1,000 mcg total) by mouth daily. (Patient taking differently: Take 1,000 mcg by mouth every morning. )    . torsemide (DEMADEX) 20 MG tablet Take 20 mg ( 1 Tablet ) alternating with 40 mg ( 2 Tablets) Daily (Patient taking differently: Take 20 mg ( 1 Tablet ) alternating with 40 mg ( 2 Tablets) Daily Per pt she states she is taking 40 mg per day.) 135 tablet 3   No facility-administered medications prior to visit.      Allergies:   Penicillins; Levaquin [levofloxacin]; Sulfa antibiotics; and Zithromax [azithromycin]   Social History   Socioeconomic History  . Marital status: Divorced    Spouse name: Not on file  . Number of children: Not on file  . Years of education: Not on file  . Highest education level: Not on file  Occupational History  . Occupation: retired    Scientific laboratory technician  . Financial resource strain: Not on file  . Food insecurity:    Worry: Not on file    Inability: Not on file  . Transportation needs:    Medical: Not on file    Non-medical: Not on file  Tobacco Use  . Smoking status: Never Smoker  . Smokeless tobacco: Never Used  Substance and Sexual Activity  . Alcohol use: No    Alcohol/week: 0.0 standard drinks  . Drug use: No  . Sexual activity: Not Currently    Birth control/protection: None  Lifestyle  . Physical activity:    Days per week: Not on file    Minutes per session: Not on file  . Stress: Not on file  Relationships  . Social connections:    Talks on phone: Not on file    Gets together: Not on file    Attends religious service: Not on file    Active member of club or organization: Not on file    Attends meetings of clubs or organizations: Not on file    Relationship status: Not on file  Other Topics Concern  . Not on file  Social History Narrative  . Not on file     Family History:  The patient's family history includes Anxiety disorder in her sister and sister; Colon cancer in her brother; Depression in her sister and sister.   Review of Systems:   Please see the history of present illness.     General:  No chills, fever, night sweats or weight changes.  Cardiovascular:  No chest pain, edema, palpitations, paroxysmal nocturnal dyspnea. Positive for orthopnea and dyspnea on exertion.  Dermatological: No rash, lesions/masses Respiratory: No cough, dyspnea Urologic: No hematuria, dysuria Abdominal:   No nausea, vomiting, diarrhea, bright red blood per rectum, melena, or hematemesis Neurologic:  No visual changes, wkns, changes in mental status. All other systems reviewed and are otherwise negative except as noted above.   Physical Exam:    VS:  BP (!) 144/81   Pulse 87   Ht 5\' 9"  (1.753 m)   Wt 173 lb 3.2 oz (78.6 kg)   SpO2 97%   BMI 25.58 kg/m    General: Well developed, elderly Caucasian  female appearing in no  acute distress. Head: Normocephalic, atraumatic, sclera non-icteric, no xanthomas, nares are without discharge.  Neck: No carotid bruits. JVD at 9cm Lungs: Respirations regular and unlabored, without wheezes or rales.  Heart: Irregularly irregular. No S3 or S4.  No rubs or gallops appreciated. 2/6 holosystolic murmur along Apex.  Abdomen: Soft, non-tender, non-distended with normoactive bowel sounds. No hepatomegaly. No rebound/guarding. No obvious abdominal masses. Msk:  Strength and tone appear normal for age. No joint deformities or effusions. Extremities: No clubbing or cyanosis. No lower extremity edema.  Distal pedal pulses are 2+ bilaterally. Neuro: Alert and oriented X 3. Moves all extremities spontaneously. No focal deficits noted. Psych:  Responds to questions appropriately with a normal affect. Skin: No rashes or lesions noted  Wt Readings from Last 3 Encounters:  10/19/18 173 lb 3.2 oz (78.6 kg)  10/10/18 170 lb (77.1 kg)  09/14/18 168 lb 6.4 oz (76.4 kg)     Studies/Labs Reviewed:   EKG:  EKG is not ordered today.    Recent Labs: 02/16/2018: TSH 3.140 08/01/2018: ALT 11; Hemoglobin 11.9; Platelets 231 09/08/2018: BNP 480.4 10/03/2018: BUN 32; Creatinine, Ser 1.57; Potassium 4.4; Sodium 144   Lipid Panel    Component Value Date/Time   CHOL 187 11/25/2015 0955   TRIG 209 (H) 11/25/2015 0955   HDL 58 11/25/2015 0955   CHOLHDL 3.2 11/25/2015 0955   CHOLHDL 3.6 10/08/2014 0940   VLDL 47 (H) 10/08/2014 0940   LDLCALC 87 11/25/2015 0955    Additional studies/ records that were reviewed today include:   Echocardiogram: 02/2018 Study Conclusions  - Left ventricle: The cavity size was normal. There was mild   concentric hypertrophy. Systolic function was normal. The   estimated ejection fraction was in the range of 60% to 65%. Wall   motion was normal; there were no regional wall motion   abnormalities. The study was not technically sufficient  to allow   evaluation of LV diastolic dysfunction due to atrial   fibrillation. - Aortic valve: Mildly calcified annulus. Trileaflet. There was   trivial regurgitation. - Mitral valve: Mildly calcified annulus. There was moderate to   severe regurgitation. - Left atrium: The atrium was severely dilated. - Right ventricle: Pacer wire or catheter noted in right ventricle. - Right atrium: The atrium was mildly dilated. Pacer wire or   catheter noted in right atrium. - Tricuspid valve: There was moderate regurgitation. - Pulmonary arteries: PA peak pressure: 47 mm Hg (S).  Assessment:    1. Diastolic dysfunction   2. Dyspnea on exertion   3. Permanent atrial fibrillation   4. Tachycardia-bradycardia syndrome ([place])   5. Mitral valve insufficiency, unspecified etiology   6. Essential hypertension   7. Medication management      Plan:   In order of problems listed above:  1. Diastolic Dysfunction/ Dyspnea on Exertion - She has continued to experience worsening dyspnea on exertion and orthopnea since dose reduction of Torsemide. Reports that weight has increased from 165 lbs to 170 lbs on her home scales with a similar weight gain on our office scales. Will plan to obtain a repeat echocardiogram in the setting of her progressive symptoms to assess LV function along with her known mitral regurgitation. Will titrate Torsemide back to 40 mg daily in the setting of her progressive symptoms. Will repeat a BNP and BMET today. I reviewed with the patient and her son we will need to follow her kidney function closely with any changes in her diuretic regimen given her known  Stage 3 CKD. Was strongly encouraged to reduce her sodium intake.    2. Permanent Atrial Fibrillation/ Use of Long-term Anticoagulation - She denies any recent palpitations and heart rate is well controlled in the 80's during today's visit.  Continue Toprol-XL for rate control. - Denies any evidence of active bleeding. Continue  Pradaxa for anticoagulation.  3. Tachy-brady Syndrome - She is s/p Medtronic PPM placement which is followed by Dr. Lovena Le. Most recent interrogation showed normal device function.   4. Mitral Regurgitation - This was moderate to severe by most recent echocardiogram in 02/2018. Will plan to obtain repeat imaging in the setting of her progressive dyspnea. - if MR is now severe, would consider referral to the Structural Heart Team for she might not be a candidate for conventional surgery but perhaps a candidate for MitraClip.   5. HTN - BP slightly elevated at 144/81 during today's visit. She reports this has overall been well controlled at home. Will continue current medication regimen at this time including Amlodipine 5 mg daily, Hydralazine 25 mg 3 times daily, Lisinopril 20 mg daily, and Toprol-XL 50 mg twice daily.  Would plan to further titrate hydralazine if BP remains above goal.    Medication Adjustments/Labs and Tests Ordered: Current medicines are reviewed at length with the patient today.  Concerns regarding medicines are outlined above.  Medication changes, Labs and Tests ordered today are listed in the Patient Instructions below. Patient Instructions  Medication Instructions:  Your physician has recommended you make the following change in your medication:  Torsemide 40 mg Daily   Labwork:Your physician recommends that you return for lab work in: Today    Testing/Procedures: Your physician has requested that you have an echocardiogram. Echocardiography is a painless test that uses sound waves to create images of your heart. It provides your doctor with information about the size and shape of your heart and how well your heart's chambers and valves are working. This procedure takes approximately one hour. There are no restrictions for this procedure.  Follow-Up: Your physician recommends that you schedule a follow-up appointment in: 4-5 weeks.   Any Other Special Instructions  Will Be Listed Below (If Applicable).  If you need a refill on your cardiac medications before your next appointment, please call your pharmacy. Thank you for choosing [facility]!    Signed, Erma Heritage, PA-C  10/19/2018 1:53 PM     [facility] [address] [place], [place] [address] Phone: [phone] Fax: [phone]

## 2018-10-19 NOTE — Patient Instructions (Addendum)
Medication Instructions:  Your physician has recommended you make the following change in your medication:  Torsemide 40 mg Daily   Labwork:Your physician recommends that you return for lab work in: Today     Testing/Procedures: Your physician has requested that you have an echocardiogram. Echocardiography is a painless test that uses sound waves to create images of your heart. It provides your doctor with information about the size and shape of your heart and how well your heart's chambers and valves are working. This procedure takes approximately one hour. There are no restrictions for this procedure.    Follow-Up: Your physician recommends that you schedule a follow-up appointment in: 4-5 weeks.    Any Other Special Instructions Will Be Listed Below (If Applicable).     If you need a refill on your cardiac medications before your next appointment, please call your pharmacy. Thank you for choosing [facility]!

## 2018-10-20 ENCOUNTER — Telehealth: Payer: Self-pay | Admitting: *Deleted

## 2018-10-20 DIAGNOSIS — Z79899 Other long term (current) drug therapy: Secondary | ICD-10-CM

## 2018-10-20 NOTE — Telephone Encounter (Signed)
-----   Message from Erma Heritage, [place] sent at 10/19/2018  5:14 PM EST ----- Please let the patient know that her renal function did improve from last check and fluid level has also slightly improved since 07/2018 but does remain elevated.  Will continue with current plans as discussed at her office visit today to titrate Torsemide to 40 mg daily given her worsening dyspnea and obtain a repeat echocardiogram. Would repeat her BMET in 2-3 weeks. Please forward a copy of labs to Kathyrn Drown, MD.

## 2018-10-24 ENCOUNTER — Telehealth: Payer: Self-pay | Admitting: Family Medicine

## 2018-10-24 NOTE — Telephone Encounter (Signed)
Discussed with pt. Pt verbalized understanding.  °

## 2018-10-24 NOTE — Telephone Encounter (Signed)
Please let the patient know that I looked over her lab work Cardiology is repeating her lab work in 2 to 3 weeks which I believe is reasonable Tell the patient she can feel free to give [place] an update in 2 to 3 weeks Follow-up sooner with [place] if any problems

## 2018-10-24 NOTE — Telephone Encounter (Signed)
Pt states she completed blood work with cardiologist and was placed on fluid pill pt states she must wait 2-3 wks until labs can be drawn if any is necessary. Advise.

## 2018-10-25 ENCOUNTER — Ambulatory Visit (HOSPITAL_COMMUNITY): Admission: RE | Admit: 2018-10-25 | Payer: PPO | Source: Ambulatory Visit

## 2018-10-27 DIAGNOSIS — H33191 Other retinoschisis and retinal cysts, right eye: Secondary | ICD-10-CM | POA: Diagnosis not present

## 2018-10-27 DIAGNOSIS — H43812 Vitreous degeneration, left eye: Secondary | ICD-10-CM | POA: Diagnosis not present

## 2018-10-27 DIAGNOSIS — H35363 Drusen (degenerative) of macula, bilateral: Secondary | ICD-10-CM | POA: Diagnosis not present

## 2018-10-27 DIAGNOSIS — H59031 Cystoid macular edema following cataract surgery, right eye: Secondary | ICD-10-CM | POA: Diagnosis not present

## 2018-10-31 ENCOUNTER — Ambulatory Visit (HOSPITAL_COMMUNITY)
Admission: RE | Admit: 2018-10-31 | Discharge: 2018-10-31 | Disposition: A | Discharge status: Home / Self Care | Payer: PPO | Source: Ambulatory Visit | Attending: Student | Admitting: Student

## 2018-10-31 DIAGNOSIS — I5189 Other ill-defined heart diseases: Secondary | ICD-10-CM | POA: Insufficient documentation

## 2018-10-31 DIAGNOSIS — R0609 Other forms of dyspnea: Secondary | ICD-10-CM

## 2018-10-31 NOTE — Progress Notes (Signed)
*  PRELIMINARY RESULTS* Echocardiogram 2D Echocardiogram has been performed.  Kelsey Sandoval 10/31/2018, 3:07 PM

## 2018-11-10 DIAGNOSIS — D692 Other nonthrombocytopenic purpura: Secondary | ICD-10-CM | POA: Diagnosis not present

## 2018-11-10 DIAGNOSIS — C44519 Basal cell carcinoma of skin of other part of trunk: Secondary | ICD-10-CM | POA: Diagnosis not present

## 2018-11-12 ENCOUNTER — Other Ambulatory Visit: Payer: Self-pay | Admitting: Family Medicine

## 2018-11-14 ENCOUNTER — Other Ambulatory Visit (HOSPITAL_COMMUNITY): Payer: Self-pay | Admitting: Psychiatry

## 2018-11-15 ENCOUNTER — Telehealth (HOSPITAL_COMMUNITY): Payer: Self-pay | Admitting: *Deleted

## 2018-11-15 ENCOUNTER — Ambulatory Visit: Payer: PPO | Admitting: Family Medicine

## 2018-11-15 ENCOUNTER — Other Ambulatory Visit (HOSPITAL_COMMUNITY): Payer: Self-pay | Admitting: Psychiatry

## 2018-11-15 MED ORDER — DULOXETINE HCL 60 MG PO CPEP: 60.0000 mg | ORAL_CAPSULE | Freq: Two times a day (BID) | ORAL | 2 refills | Status: DC

## 2018-11-15 MED ORDER — BUSPIRONE HCL 10 MG PO TABS: 10.0000 mg | ORAL_TABLET | Freq: Three times a day (TID) | ORAL | 2 refills | Status: DC

## 2018-11-15 MED ORDER — ALPRAZOLAM 0.5 MG PO TABS: 0.5000 mg | ORAL_TABLET | Freq: Every day | ORAL | 2 refills | Status: DC

## 2018-11-15 NOTE — Telephone Encounter (Signed)
Dr Harrington Challenger Patient LVM requesting refills on her medications. Next visit 12/08/2018

## 2018-11-15 NOTE — Telephone Encounter (Signed)
sent 

## 2018-11-16 ENCOUNTER — Other Ambulatory Visit: Payer: Self-pay | Admitting: Student

## 2018-11-16 DIAGNOSIS — Z79899 Other long term (current) drug therapy: Secondary | ICD-10-CM | POA: Diagnosis not present

## 2018-11-17 ENCOUNTER — Telehealth: Payer: Self-pay | Admitting: *Deleted

## 2018-11-17 LAB — BASIC METABOLIC PANEL
BUN/Creatinine Ratio: 20 (ref 12–28)
BUN: 31 mg/dL — ABNORMAL HIGH (ref 8–27)
CHLORIDE: 106 mmol/L (ref 96–106)
CO2: 20 mmol/L (ref 20–29)
Calcium: 8.7 mg/dL (ref 8.7–10.3)
Creatinine, Ser: 1.55 mg/dL — ABNORMAL HIGH (ref 0.57–1.00)
GFR calc non Af Amer: 31 mL/min/{1.73_m2} — ABNORMAL LOW (ref >59)
GFR, EST AFRICAN AMERICAN: 35 mL/min/{1.73_m2} — AB (ref >59)
Glucose: 76 mg/dL (ref 65–99)
Potassium: 5 mmol/L (ref 3.5–5.2)
Sodium: 142 mmol/L (ref 134–144)

## 2018-11-17 NOTE — Telephone Encounter (Signed)
Called patient with test results. No answer. Left message to call back.  

## 2018-11-17 NOTE — Telephone Encounter (Signed)
-----   Message from Erma Heritage, [place] sent at 11/17/2018 12:58 PM EST ----- Please let the patient know that her kidney function has overall remained stable when compared to prior values over the past month. We had decreased her Torsemide from 40 mg daily to 20 mg daily alternating with 40mg  daily in 10/2018 but she experienced worsening weight gain and dyspnea with this reduction. If weight overall stable, would continue current dosing for now as she has a follow-up visit within the next 2-3 weeks and volume status can be reassessed at that time. Please make sure she is not taking any NSAIDS. Forward a copy to Kathyrn Drown, MD.

## 2018-11-20 ENCOUNTER — Other Ambulatory Visit: Payer: Self-pay | Admitting: Family Medicine

## 2018-11-23 DIAGNOSIS — M461 Sacroiliitis, not elsewhere classified: Secondary | ICD-10-CM | POA: Diagnosis not present

## 2018-11-27 ENCOUNTER — Other Ambulatory Visit: Payer: Self-pay | Admitting: Family Medicine

## 2018-11-28 ENCOUNTER — Other Ambulatory Visit: Payer: Self-pay | Admitting: Student

## 2018-11-28 NOTE — Telephone Encounter (Signed)
Patient called stating that she could not get refill on Lasix until the first of March. She states that she would run out before then by like 2 day. She wants to know if it will be ok to go for those days without taking medicine.  tg

## 2018-11-28 NOTE — Telephone Encounter (Signed)
Note    ----- Message from Erma Heritage, PA-C sent at 11/17/2018 12:58 PM EST ----- Please let the patient know that her kidney function has overall remained stable when compared to prior values over the past month. We had decreased her Torsemide from 40 mg daily to 20 mg daily alternating with 40mg  daily in 10/2018 but she experienced worsening weight gain and dyspnea with this reduction. If weight overall stable, would continue current dosing for now as she has a follow-up visit within the next 2-3 weeks and volume status can be reassessed at that time. Please make sure she is not taking any NSAIDS. Forward a copy to [place] A, MD.      Need to clarify pt that she is taking Torsemide.Also, per note, she should be taking torsemide 40 mg alternating 20 mg   Line just rings, no vm or machine

## 2018-12-06 ENCOUNTER — Telehealth: Payer: Self-pay

## 2018-12-06 ENCOUNTER — Other Ambulatory Visit: Payer: Self-pay

## 2018-12-06 ENCOUNTER — Other Ambulatory Visit (HOSPITAL_COMMUNITY)
Admission: RE | Admit: 2018-12-06 | Discharge: 2018-12-06 | Disposition: A | Discharge status: Home / Self Care | Payer: PPO | Source: Ambulatory Visit | Attending: Cardiovascular Disease | Admitting: Cardiovascular Disease

## 2018-12-06 ENCOUNTER — Encounter: Payer: Self-pay | Admitting: Cardiovascular Disease

## 2018-12-06 ENCOUNTER — Ambulatory Visit: Payer: PPO | Admitting: Cardiovascular Disease

## 2018-12-06 ENCOUNTER — Ambulatory Visit (HOSPITAL_COMMUNITY)
Admission: RE | Admit: 2018-12-06 | Discharge: 2018-12-06 | Disposition: A | Discharge status: Home / Self Care | Payer: PPO | Source: Ambulatory Visit | Attending: Cardiovascular Disease | Admitting: Cardiovascular Disease

## 2018-12-06 VITALS — BP 116/60 | HR 48 | Ht 69.0 in | Wt 173.0 lb

## 2018-12-06 DIAGNOSIS — N183 Chronic kidney disease, stage 3 unspecified: Secondary | ICD-10-CM

## 2018-12-06 DIAGNOSIS — R0602 Shortness of breath: Secondary | ICD-10-CM

## 2018-12-06 DIAGNOSIS — I340 Nonrheumatic mitral (valve) insufficiency: Secondary | ICD-10-CM | POA: Diagnosis not present

## 2018-12-06 DIAGNOSIS — I10 Essential (primary) hypertension: Secondary | ICD-10-CM

## 2018-12-06 DIAGNOSIS — I4821 Permanent atrial fibrillation: Secondary | ICD-10-CM | POA: Diagnosis not present

## 2018-12-06 DIAGNOSIS — Z950 Presence of cardiac pacemaker: Secondary | ICD-10-CM

## 2018-12-06 DIAGNOSIS — R0609 Other forms of dyspnea: Secondary | ICD-10-CM

## 2018-12-06 DIAGNOSIS — I495 Sick sinus syndrome: Secondary | ICD-10-CM | POA: Diagnosis not present

## 2018-12-06 DIAGNOSIS — Z79899 Other long term (current) drug therapy: Secondary | ICD-10-CM

## 2018-12-06 LAB — BASIC METABOLIC PANEL
Anion gap: 11 (ref 5–15)
BUN: 62 mg/dL — ABNORMAL HIGH (ref 8–23)
CALCIUM: 8.2 mg/dL — AB (ref 8.9–10.3)
CO2: 20 mmol/L — ABNORMAL LOW (ref 22–32)
Chloride: 100 mmol/L (ref 98–111)
Creatinine, Ser: 2.97 mg/dL — ABNORMAL HIGH (ref 0.44–1.00)
GFR calc Af Amer: 16 mL/min — ABNORMAL LOW (ref >60)
GFR calc non Af Amer: 14 mL/min — ABNORMAL LOW (ref >60)
Glucose, Bld: 95 mg/dL (ref 70–99)
Potassium: 4.7 mmol/L (ref 3.5–5.1)
Sodium: 131 mmol/L — ABNORMAL LOW (ref 135–145)

## 2018-12-06 LAB — CBC
HCT: 33.7 % — ABNORMAL LOW (ref 36.0–46.0)
Hemoglobin: 10.4 g/dL — ABNORMAL LOW (ref 12.0–15.0)
MCH: 28.5 pg (ref 26.0–34.0)
MCHC: 30.9 g/dL (ref 30.0–36.0)
MCV: 92.3 fL (ref 80.0–100.0)
Platelets: 211 10*3/uL (ref 150–400)
RBC: 3.65 MIL/uL — ABNORMAL LOW (ref 3.87–5.11)
RDW: 13.7 % (ref 11.5–15.5)
WBC: 8.7 10*3/uL (ref 4.0–10.5)
nRBC: 0 % (ref 0.0–0.2)

## 2018-12-06 LAB — BRAIN NATRIURETIC PEPTIDE: B Natriuretic Peptide: 744 pg/mL — ABNORMAL HIGH (ref 0.0–100.0)

## 2018-12-06 MED ORDER — TORSEMIDE 20 MG PO TABS: 40.0000 mg | ORAL_TABLET | Freq: Every day | ORAL | 3 refills | Status: DC

## 2018-12-06 MED ORDER — TORSEMIDE 20 MG PO TABS: 20.0000 mg | ORAL_TABLET | Freq: Every day | ORAL | 3 refills | Status: DC

## 2018-12-06 NOTE — Patient Instructions (Signed)
Medication Instructions: Your physician recommends that you continue on your current medications as directed. Please refer to the Current Medication list given to you today.   Labwork: Cbc,bmet, bnp today  Procedures/Testing: Your physician has recommended that you have a pulmonary function test. Pulmonary Function Tests are a group of tests that measure how well air moves in and out of your lungs.    Follow-Up: 2 months with Bernerd Pho PA-C  Any Additional Special Instructions Will Be Listed Below (If Applicable).     If you need a refill on your cardiac medications before your next appointment, please call your pharmacy.

## 2018-12-06 NOTE — Progress Notes (Signed)
SUBJECTIVE: The patient presents for follow-up of diastolic heart failure.  She had been evaluated in the office and adjustments to torsemide dosage were performed.  She developed acute renal insufficiency and torsemide was switched to 40 mg and 20 mg on alternate days rather than 40 mg daily.  It was noted that she consumes a diet high in sodium with frozen meals and soup.  She lives by herself.  She is here with her son.  She says that she continues to have exertional dyspnea and feels weak and tired.  She seldom has chest pains.  Her son who is with her says she actually seems better.  She denies palpitations and leg swelling.  She denies orthopnea and paroxysmal nocturnal dyspnea.    Review of Systems: As per "subjective", otherwise negative.  Allergies  Allergen Reactions  . Penicillins Other (See Comments)    Caused patient to pass out.Can take cephalosporins Has patient had a PCN reaction causing immediate rash, facial/tongue/throat swelling, SOB or lightheadedness with hypotension: no Has patient had a PCN reaction causing severe rash involving mucus membranes or skin necrosis: No Has patient had a PCN reaction that required hospitalization No Has patient had a PCN reaction occurring within the last 10 years: No If all of the above answers are "NO", then may proceed with Cephalosporin use.   . Levaquin [Levofloxacin] Nausea Only  . Sulfa Antibiotics Other (See Comments)    Unknown  . Zithromax [Azithromycin] Nausea Only and Rash    Current Outpatient Medications  Medication Sig Dispense Refill  . acetaminophen (TYLENOL) 650 MG CR tablet Take 1,300 mg by mouth every 8 (eight) hours as needed for pain.    Marland Kitchen ALPRAZolam (XANAX) 0.5 MG tablet Take 1 tablet (0.5 mg total) by mouth at bedtime. Take one qhs 30 tablet 2  . amLODipine (NORVASC) 5 MG tablet Take 1 tablet (5 mg total) by mouth daily. 90 tablet 3  . budesonide-formoterol (SYMBICORT) 80-4.5 MCG/ACT inhaler Inhale  2 puffs into the lungs 2 (two) times daily. 1 Inhaler 12  . busPIRone (BUSPAR) 10 MG tablet Take 1 tablet (10 mg total) by mouth 3 (three) times daily. 270 tablet 2  . DULoxetine (CYMBALTA) 60 MG capsule Take 1 capsule (60 mg total) by mouth 2 (two) times daily. 180 capsule 2  . gabapentin (NEURONTIN) 300 MG capsule TAKE 1 CAPSULE BY MOUTH IN THE MORNING, 1 CAPSULE IN THE AFTERNOON, AND 2 CAPSULES IN THE EVENING (Patient taking differently: Take 300 mg by mouth 3 (three) times daily. ) 120 capsule 5  . hydrALAZINE (APRESOLINE) 25 MG tablet TAKE 1 TABLET BY MOUTH EVERY 8 HOURS 90 tablet 5  . HYDROcodone-acetaminophen (NORCO) 10-325 MG tablet Take one tablet every 4 hours prn pain. Max 6 tablets per day 180 tablet 0  . HYDROcodone-acetaminophen (NORCO) 10-325 MG tablet 1 q4 hours prn 180 tablet 0  . HYDROcodone-acetaminophen (NORCO) 10-325 MG tablet 1 q4 hours prn pain 180 tablet 0  . hydrOXYzine (ATARAX/VISTARIL) 10 MG tablet One bid prn nausea, caution drowsiness (Patient taking differently: Take 10 mg by mouth 2 (two) times daily as needed for nausea or vomiting. ) 40 tablet 2  . levothyroxine (SYNTHROID, LEVOTHROID) 50 MCG tablet TAKE 1 TABLET BY MOUTH ONCE DAILY (Patient taking differently: Take 50 mcg by mouth daily before breakfast. ) 90 tablet 1  . lisinopril (PRINIVIL,ZESTRIL) 20 MG tablet TAKE 1 TABLET BY MOUTH ONCE DAILY 90 tablet 1  . metoprolol succinate (TOPROL-XL) 50 MG  24 hr tablet TAKE 1 TABLET BY MOUTH TWICE DAILY. TAKE WITH OR IMMEDIATELY FOLLOWING A MEAL. 180 tablet 1  . mirtazapine (REMERON) 15 MG tablet Take 1 tablet (15 mg total) by mouth at bedtime. (Patient taking differently: Take 7.5-15 mg by mouth at bedtime as needed (FOR SLEEP). ) 90 tablet 2  . pantoprazole (PROTONIX) 40 MG tablet Take 1 tablet (40 mg total) by mouth daily. (Patient taking differently: Take 40 mg by mouth every morning. ) 90 tablet 1  . PRADAXA 150 MG CAPS capsule TAKE 1 CAPSULE BY MOUTH TWICE DAILY 180  capsule 2  . pravastatin (PRAVACHOL) 80 MG tablet TAKE 1 TABLET BY MOUTH ONCE DAILY (Patient taking differently: Take 80 mg by mouth every evening. ) 90 tablet 1  . PROAIR HFA 108 (90 Base) MCG/ACT inhaler INHALE 2 PUFFS INTO LUNGS EVERY 6 HOURS AS NEEDED FOR WHEEZING OR SHORTNESS OF BREATH 9 g 6  . torsemide (DEMADEX) 20 MG tablet Take 2 tablets (40 mg total) by mouth daily. 180 tablet 3  . traZODone (DESYREL) 50 MG tablet 1/2 tablet to 1 qhs prn 30 tablet 5  . vitamin B-12 (CYANOCOBALAMIN) 1000 MCG tablet Take 1 tablet (1,000 mcg total) by mouth daily. (Patient taking differently: Take 1,000 mcg by mouth every morning. )     No current facility-administered medications for this visit.     Past Medical History:  Diagnosis Date  . Anxiety   . Aortic atherosclerosis ([place]) 10/10/2018  . Arthritis   . Back pain, chronic    Sciatica  . Cataracts, bilateral   . COPD (chronic obstructive pulmonary disease) ([place]) 10/10/2018  . Depression   . Dysrhythmia    AFib  . Essential hypertension   . GERD (gastroesophageal reflux disease)   . Hyperlipidemia   . Hypothyroidism   . Membranous nephropathy determined by biopsy 06/07/2015  . Nephrotic syndrome    RHUX  . Neuropathy   . Osteopenia   . Pacemaker    Medtronic  . PAF (paroxysmal atrial fibrillation) ([place])   . Pre-diabetes   . Tachycardia-bradycardia syndrome [facility])     Past Surgical History:  Procedure Laterality Date  . ABDOMINAL HYSTERECTOMY     partial-pt has no ovaries  . ANTERIOR VITRECTOMY Right 03/11/2018   Procedure: ANTERIOR VITRECTOMY;  Surgeon: Baruch Goldmann, MD;  Location: [facility];  Service: Ophthalmology;  Laterality: Right;  . APPENDECTOMY    . BACK SURGERY    . BIOPSY N/A 01/17/2014   Procedure: BIOPSY;  Surgeon: Rogene Houston, MD;  Location: [facility];  Service: Endoscopy;  Laterality: N/A;  . CATARACT EXTRACTION W/PHACO Left 02/28/2015   Procedure: CATARACT EXTRACTION PHACO AND INTRAOCULAR LENS PLACEMENT (IOC);   Surgeon: [name], MD;  Location: [facility];  Service: Ophthalmology;  Laterality: Left;  CDE 18.71  . CATARACT EXTRACTION W/PHACO Right 03/11/2018   Procedure: CATARACT EXTRACTION PHACO  AND INTRAOCULAR LENS PLACEMENT RIGHT EYE ;  Surgeon: Baruch Goldmann, MD;  Location: [facility];  Service: Ophthalmology;  Laterality: Right;  CDE: 19.39  . COLONOSCOPY  9/05  . COLONOSCOPY N/A 10/11/2013   Procedure: COLONOSCOPY;  Surgeon: Rogene Houston, MD;  Location: [facility];  Service: Endoscopy;  Laterality: N/A;  1200  . ESOPHAGOGASTRODUODENOSCOPY N/A 01/17/2014   Procedure: ESOPHAGOGASTRODUODENOSCOPY (EGD);  Surgeon: Rogene Houston, MD;  Location: [facility];  Service: Endoscopy;  Laterality: N/A;  230  . ESOPHAGOGASTRODUODENOSCOPY N/A 01/02/2016   Procedure: ESOPHAGOGASTRODUODENOSCOPY (EGD);  Surgeon: Rogene Houston, MD;  Location: [facility];  Service: Endoscopy;  Laterality: N/A;  240  . INSERT / REPLACE / REMOVE PACEMAKER     2012  . Wisdom tooth extracton      Social History   Socioeconomic History  . Marital status: Divorced    Spouse name: Not on file  . Number of children: Not on file  . Years of education: Not on file  . Highest education level: Not on file  Occupational History  . Occupation: retired  Scientific laboratory technician  . Financial resource strain: Not on file  . Food insecurity:    Worry: Not on file    Inability: Not on file  . Transportation needs:    Medical: Not on file    Non-medical: Not on file  Tobacco Use  . Smoking status: Never Smoker  . Smokeless tobacco: Never Used  Substance and Sexual Activity  . Alcohol use: No    Alcohol/week: 0.0 standard drinks  . Drug use: No  . Sexual activity: Not Currently    Birth control/protection: None  Lifestyle  . Physical activity:    Days per week: Not on file    Minutes per session: Not on file  . Stress: Not on file  Relationships  . Social connections:    Talks on phone: Not on file    Gets together: Not on file     Attends religious service: Not on file    Active member of club or organization: Not on file    Attends meetings of clubs or organizations: Not on file    Relationship status: Not on file  . Intimate partner violence:    Fear of current or ex partner: Not on file    Emotionally abused: Not on file    Physically abused: Not on file    Forced sexual activity: Not on file  Other Topics Concern  . Not on file  Social History Narrative  . Not on file     Vitals:   12/06/18 0848  BP: 116/60  Pulse: (!) 48  SpO2: 93%  Weight: 173 lb (78.5 kg)  Height: 5\' 9"  (1.753 m)    Wt Readings from Last 3 Encounters:  12/06/18 173 lb (78.5 kg)  10/19/18 173 lb 3.2 oz (78.6 kg)  10/10/18 170 lb (77.1 kg)     PHYSICAL EXAM General: NAD HEENT: Normal. Neck: No JVD, no thyromegaly. Lungs: Clear to auscultation bilaterally with normal respiratory effort. CV: Regular rate and irregular rhythm, normal S1/S2, no S3, no murmur. No pretibial or periankle edema.  Abdomen: Soft, nontender, no distention.  Neurologic: Alert and oriented.  Psych: Normal affect. Skin: Normal. Musculoskeletal: No gross deformities.    ECG: Reviewed above under Subjective   Labs: Lab Results  Component Value Date/Time   K 5.0 11/16/2018 11:37 AM   BUN 31 (H) 11/16/2018 11:37 AM   CREATININE 1.55 (H) 11/16/2018 11:37 AM   CREATININE 1.18 (H) 10/13/2016 10:04 AM   ALT 11 08/01/2018 02:52 AM   TSH 3.140 02/16/2018 02:06 PM   HGB 11.9 (L) 08/01/2018 02:52 AM   HGB 13.1 09/06/2017 02:31 PM     Lipids: Lab Results  Component Value Date/Time   LDLCALC 87 11/25/2015 09:55 AM   CHOL 187 11/25/2015 09:55 AM   TRIG 209 (H) 11/25/2015 09:55 AM   HDL 58 11/25/2015 09:55 AM       ASSESSMENT AND PLAN: 1.  Exertional dyspnea: Weight is stable at 173 pounds.  PFTs ordered last May were  never performed.  CT angiography of the chest on 07/31/2018 showed minimal coronary artery calcifications.  BNP 519 on 10/19/2018.   Echocardiogram demonstrated trivial mitral regurgitation.  She does not have typical symptoms for angina and CT angiogram on 07/31/2018 showed minimal coronary artery calcifications.  CBC on 08/01/2018 showed hemoglobin of 11.9. I will obtain pulmonary function testing.  I will obtain a follow-up CBC and check a BNP and basic metabolic panel.  2.  Permanent atrial fibrillation: Symptomatically stable on Toprol-XL.  Continue Pradaxa for anticoagulation.  3.  Tachycardia-bradycardia syndrome: Status post pacemaker.  She has normal device function.  4.  Mitral regurgitation: Echocardiogram on 10/31/2018 demonstrated trivial mitral regurgitation and mild aortic regurgitation.  Severity had been classified as moderate to severe in the past.  5.  Hypertension: BP is normal.  No changes to therapy.  6.  Chronic kidney disease stage III: BUN 31 and creatinine 1.55 on 11/16/2018.  Overall this has remained stable.  I will repeat a basic metabolic panel.   Disposition: Follow up 2 months   Kate Sable, M.D., F.A.C.C.

## 2018-12-06 NOTE — Telephone Encounter (Signed)
-----   Message from Herminio Commons, MD sent at 12/06/2018 10:35 AM EST ----- Renal function has gotten significantly worse which likely explains her fatigue and worsening symptoms.  Sodium is also mildly low.  Hold torsemide for the next 2 days and reduce dosage to 20 mg daily.  Repeat basic metabolic panel in 1 week.

## 2018-12-06 NOTE — Telephone Encounter (Signed)
Patient in office for visit today.

## 2018-12-06 NOTE — Telephone Encounter (Signed)
Left message for daughter to call back, labs show worsening renal function.Needs to drop torsemide dose to 20 mg daily after holding for 2 days, repeat bmet in 1 week and needs to come and get chest x-ray

## 2018-12-07 ENCOUNTER — Telehealth: Payer: Self-pay | Admitting: Family Medicine

## 2018-12-07 DIAGNOSIS — D649 Anemia, unspecified: Secondary | ICD-10-CM

## 2018-12-07 NOTE — Telephone Encounter (Signed)
Recent lab work shows hemoglobin has dropped  Cardiology send [place] this information  Nurses-please connect with patient let her know her hemoglobin has dropped compared to previous this needs further looking into it is not emergent but we do need to move forward.  I recommend hemoglobin hematocrit, TIBC, ferritin, B12-normocytic anemia-also iFOB OT and finally a follow-up office visit several days after doing these above tests

## 2018-12-08 ENCOUNTER — Ambulatory Visit (HOSPITAL_COMMUNITY): Payer: Self-pay | Admitting: Psychiatry

## 2018-12-08 DIAGNOSIS — Z85828 Personal history of other malignant neoplasm of skin: Secondary | ICD-10-CM | POA: Diagnosis not present

## 2018-12-08 DIAGNOSIS — T148XXA Other injury of unspecified body region, initial encounter: Secondary | ICD-10-CM | POA: Diagnosis not present

## 2018-12-08 DIAGNOSIS — Z08 Encounter for follow-up examination after completed treatment for malignant neoplasm: Secondary | ICD-10-CM | POA: Diagnosis not present

## 2018-12-08 NOTE — Telephone Encounter (Signed)
Patient advised per Dr Nicki Reaper:  her hemoglobin has dropped compared to previous this needs further looking into it is not emergent but we do need to move forward.  Dr Nicki Reaper  recommends hemoglobin hematocrit, TIBC, ferritin, B12-normocytic anemia-also iFOB stool test and finally a follow-up office visit several days after doing these above tests. Blood work ordered in [employer]. Patient notified and verbalized understanding.

## 2018-12-08 NOTE — Addendum Note (Signed)
Addended by: Dairl Ponder on: 12/08/2018 10:11 AM   Modules accepted: Orders

## 2018-12-08 NOTE — Addendum Note (Signed)
Addended by: Dairl Ponder on: 12/08/2018 10:54 AM   Modules accepted: Orders

## 2018-12-12 ENCOUNTER — Other Ambulatory Visit (HOSPITAL_COMMUNITY)
Admission: RE | Admit: 2018-12-12 | Discharge: 2018-12-12 | Disposition: A | Discharge status: Home / Self Care | Payer: PPO | Source: Ambulatory Visit | Attending: Family Medicine | Admitting: Family Medicine

## 2018-12-12 ENCOUNTER — Ambulatory Visit (INDEPENDENT_AMBULATORY_CARE_PROVIDER_SITE_OTHER): Payer: PPO | Admitting: Family Medicine

## 2018-12-12 ENCOUNTER — Telehealth: Payer: Self-pay | Admitting: Cardiovascular Disease

## 2018-12-12 ENCOUNTER — Encounter: Payer: Self-pay | Admitting: Family Medicine

## 2018-12-12 VITALS — BP 132/82 | Ht 69.0 in | Wt 173.0 lb

## 2018-12-12 DIAGNOSIS — N289 Disorder of kidney and ureter, unspecified: Secondary | ICD-10-CM | POA: Insufficient documentation

## 2018-12-12 DIAGNOSIS — R0609 Other forms of dyspnea: Secondary | ICD-10-CM

## 2018-12-12 DIAGNOSIS — I480 Paroxysmal atrial fibrillation: Secondary | ICD-10-CM

## 2018-12-12 DIAGNOSIS — E875 Hyperkalemia: Secondary | ICD-10-CM | POA: Diagnosis not present

## 2018-12-12 DIAGNOSIS — N183 Chronic kidney disease, stage 3 unspecified: Secondary | ICD-10-CM

## 2018-12-12 LAB — BASIC METABOLIC PANEL
Anion gap: 8 (ref 5–15)
BUN: 64 mg/dL — ABNORMAL HIGH (ref 8–23)
CO2: 21 mmol/L — AB (ref 22–32)
Calcium: 9.2 mg/dL (ref 8.9–10.3)
Chloride: 102 mmol/L (ref 98–111)
Creatinine, Ser: 2.78 mg/dL — ABNORMAL HIGH (ref 0.44–1.00)
GFR calc Af Amer: 18 mL/min — ABNORMAL LOW (ref >60)
GFR calc non Af Amer: 15 mL/min — ABNORMAL LOW (ref >60)
Glucose, Bld: 96 mg/dL (ref 70–99)
Potassium: 5.8 mmol/L — ABNORMAL HIGH (ref 3.5–5.1)
Sodium: 131 mmol/L — ABNORMAL LOW (ref 135–145)

## 2018-12-12 LAB — D-DIMER, QUANTITATIVE: D-Dimer, Quant: 1.13 ug/mL-FEU — ABNORMAL HIGH (ref 0.00–0.50)

## 2018-12-12 LAB — BRAIN NATRIURETIC PEPTIDE: B NATRIURETIC PEPTIDE 5: 940 pg/mL — AB (ref 0.0–100.0)

## 2018-12-12 MED ORDER — DEXLANSOPRAZOLE 60 MG PO CPDR: 60.0000 mg | DELAYED_RELEASE_CAPSULE | Freq: Every day | ORAL | 4 refills | Status: DC

## 2018-12-12 NOTE — Telephone Encounter (Signed)
Patient's daughter left message stating that she believed patient experienced sx of mini stroke this weekend. She was requesting that patient be seen but she has appointment with PCP today @ 4pm. Please call patient's daughter. / tg

## 2018-12-12 NOTE — Progress Notes (Signed)
Subjective:    Patient ID: Kelsey Sandoval, female    DOB: 1933-01-30, 85 y.o.   MRN: [id number]  HPI  Patient arrives to discuss not feeling well for a about 3 weeks. Patient stated that Saturday and yesterday her family reports that they feel like she had a mini stroke. Patient's face was drooping and she was unable to get her thoughts and words out .  The daughter relates that she is much improved compared to where she was about 70% improved.  The patient also states she is dealt with 3 to 4 weeks of shortness of breath she often feels like she cannot get her breath she gets short winded when she moves around she denies chest pressure or tightness denies coughing up denies hemoptysis denies sweats and chills  Patient also has severe renal chronic kidney disease with proteinuria- her creatinine had been in the 1.5 range but recently jumped up cardiology reduced her diuretic Review of Systems  Constitutional: Negative for activity change, appetite change and fatigue.  HENT: Negative for congestion and rhinorrhea.   Respiratory: Positive for shortness of breath. Negative for cough.   Cardiovascular: Negative for chest pain and leg swelling.  Gastrointestinal: Negative for abdominal pain and diarrhea.  Endocrine: Negative for polydipsia and polyphagia.  Skin: Negative for color change.  Neurological: Negative for dizziness and weakness.  Psychiatric/Behavioral: Negative for behavioral problems and confusion.       Objective:   Physical Exam Vitals signs reviewed.  Constitutional:      General: She is not in acute distress. HENT:     Head: Normocephalic and atraumatic.  Eyes:     General:        Right eye: No discharge.        Left eye: No discharge.  Neck:     Trachea: No tracheal deviation.  Cardiovascular:     Rate and Rhythm: Normal rate. Rhythm irregular.     Heart sounds: Normal heart sounds. No murmur.  Pulmonary:     Effort: Pulmonary effort is normal. No respiratory  distress.     Breath sounds: Normal breath sounds.  Lymphadenopathy:     Cervical: No cervical adenopathy.  Skin:    General: Skin is warm and dry.  Neurological:     Mental Status: She is alert.     Coordination: Coordination normal.  Psychiatric:        Behavior: Behavior normal.           Assessment & Plan:  Patient is on Pradaxa daily I doubt DVT/pulmonary embolism will check d-dimer  Could have an element of CHF but recent echo showed good ejection fraction will check BNP  Significant kidney disease along with recent onset of shortness of breath and fatigue recheck metabolic 7  Possible stroke related condition just recently seemingly resolved now will do CT scan of the head without contrast patient cannot do MRI because of pacemaker  Patient also has underlying anxiety related conditions being cared for by psychiatry  Patient also has pulmonary function test coming up await the results O2 saturation today 95%  Polymedicine- I will be working hard to try to get the patient reduced on some of her medications.  I do not feel gabapentin is helping her any longer I recommend a slow titration over the next 3 weeks and reduce it down to none I also recommend stopping trazodone.  In addition to this depending on how patient is doing may see if her psychiatrist can reduce  her Remeron  Stat labs were ordered  Patient has hyperkalemia probably related to her kidney disease I instructed the daughter to stop her lisinopril.  BNP has gone up change torsemide to 1-1/2 tablet daily  Will discuss case with cardiology-will recheck potassium on Wednesday we will also get nephrology involved If patient gets worse to go to ER 40 minutes spent with patient and with family Greater than half the amount was spent in consultation and coordination   recheck in 10-14 days EKG no acute changes

## 2018-12-12 NOTE — Telephone Encounter (Signed)
1047 hrs : [id number]

## 2018-12-13 ENCOUNTER — Other Ambulatory Visit: Payer: Self-pay | Admitting: Family Medicine

## 2018-12-13 ENCOUNTER — Telehealth: Payer: Self-pay | Admitting: Family Medicine

## 2018-12-13 DIAGNOSIS — R0609 Other forms of dyspnea: Principal | ICD-10-CM

## 2018-12-13 DIAGNOSIS — I10 Essential (primary) hypertension: Secondary | ICD-10-CM

## 2018-12-13 DIAGNOSIS — N183 Chronic kidney disease, stage 3 unspecified: Secondary | ICD-10-CM

## 2018-12-13 NOTE — Telephone Encounter (Signed)
So as best we can tell her problems are manageable but all of her problems are very serious and can eventually cause her to pass away at 85 years of age- we are doing the best we can to manage through multiple health issues-she needs to keep her appointment with cardiology as planned her follow-up with [place] as planned plus also nephrology consult to follow-up with her nephrologist  I believe patient was seen [facility]

## 2018-12-13 NOTE — Telephone Encounter (Signed)
Spoke with daughter [name]. [name] states that patients bed hurts her legs and back so she got up last night and took a pain pill. Pt also took a stool softener last night after eating, daughter states that she ate well. Pt went to bathroom and did feel better afterwards. Pt daughter states that breathing is better. Daughter is wanting reassurance that patient is not going to pass away. Daughter has concerns that pt heart could just stop if they do not stay on top of her problems. Lab orders placed and daughter states that she will send her brother up to pick the lab orders up. Pt daughter verbalized understanding.

## 2018-12-13 NOTE — Telephone Encounter (Signed)
Spoke with daughter [name]. Pt was seen by Dr. Wolfgang Phoenix on yesterday, lab work completed and results called to daughter last night at 2230. Dr. Wolfgang Phoenix informed daughter that he will get in touch with cardiology regarding her care plan. Daughter would like Dr. Bronson Ing to reach out to [place] today and someone to call and update her.

## 2018-12-13 NOTE — Telephone Encounter (Signed)
Left message to return call 

## 2018-12-13 NOTE — Telephone Encounter (Signed)
I did speak with Cardiology-please call and talk with the daughter regarding this-she is on the demographics-[place] They agree with stopping the lisinopril It is important to repeat metabolic 7 on Wednesday this should be done stat at the hospital lab I would like for the patient to do a follow-up visit in 1 week Instead of March 26

## 2018-12-13 NOTE — Progress Notes (Signed)
Left provider cell phone number to have Dr.Koneswaren call

## 2018-12-13 NOTE — Telephone Encounter (Signed)
I just spoke with Dr. Wolfgang Phoenix and I agree with his management plan. He plans to call the family and update them.

## 2018-12-14 ENCOUNTER — Emergency Department (HOSPITAL_COMMUNITY)
Admission: EM | Admit: 2018-12-14 | Discharge: 2018-12-14 | Disposition: A | Discharge status: Home / Self Care | Payer: PPO | Source: Home / Self Care

## 2018-12-14 ENCOUNTER — Emergency Department (HOSPITAL_BASED_OUTPATIENT_CLINIC_OR_DEPARTMENT_OTHER): Payer: PPO

## 2018-12-14 ENCOUNTER — Other Ambulatory Visit: Payer: Self-pay

## 2018-12-14 ENCOUNTER — Encounter (HOSPITAL_COMMUNITY): Payer: Self-pay | Admitting: Emergency Medicine

## 2018-12-14 ENCOUNTER — Emergency Department (HOSPITAL_COMMUNITY): Payer: PPO

## 2018-12-14 ENCOUNTER — Other Ambulatory Visit (HOSPITAL_COMMUNITY)
Admission: RE | Admit: 2018-12-14 | Discharge: 2018-12-14 | Disposition: A | Discharge status: Home / Self Care | Payer: PPO | Source: Ambulatory Visit | Attending: Family Medicine | Admitting: Family Medicine

## 2018-12-14 ENCOUNTER — Emergency Department (HOSPITAL_BASED_OUTPATIENT_CLINIC_OR_DEPARTMENT_OTHER)
Admission: EM | Admit: 2018-12-14 | Discharge: 2018-12-14 | Disposition: A | Discharge status: Home / Self Care | Payer: PPO | Attending: Emergency Medicine | Admitting: Emergency Medicine

## 2018-12-14 ENCOUNTER — Encounter (HOSPITAL_BASED_OUTPATIENT_CLINIC_OR_DEPARTMENT_OTHER): Payer: Self-pay | Admitting: Emergency Medicine

## 2018-12-14 ENCOUNTER — Ambulatory Visit: Admission: EM | Admit: 2018-12-14 | Discharge: 2018-12-14 | Payer: PPO | Source: Home / Self Care

## 2018-12-14 DIAGNOSIS — R0609 Other forms of dyspnea: Secondary | ICD-10-CM

## 2018-12-14 DIAGNOSIS — N183 Chronic kidney disease, stage 3 (moderate): Secondary | ICD-10-CM

## 2018-12-14 DIAGNOSIS — Y929 Unspecified place or not applicable: Secondary | ICD-10-CM | POA: Diagnosis not present

## 2018-12-14 DIAGNOSIS — T148XXA Other injury of unspecified body region, initial encounter: Secondary | ICD-10-CM

## 2018-12-14 DIAGNOSIS — J449 Chronic obstructive pulmonary disease, unspecified: Secondary | ICD-10-CM | POA: Diagnosis not present

## 2018-12-14 DIAGNOSIS — X58XXXA Exposure to other specified factors, initial encounter: Secondary | ICD-10-CM | POA: Insufficient documentation

## 2018-12-14 DIAGNOSIS — M79604 Pain in right leg: Secondary | ICD-10-CM

## 2018-12-14 DIAGNOSIS — S8011XA Contusion of right lower leg, initial encounter: Secondary | ICD-10-CM | POA: Insufficient documentation

## 2018-12-14 DIAGNOSIS — I10 Essential (primary) hypertension: Secondary | ICD-10-CM

## 2018-12-14 DIAGNOSIS — E039 Hypothyroidism, unspecified: Secondary | ICD-10-CM | POA: Diagnosis not present

## 2018-12-14 DIAGNOSIS — Z7902 Long term (current) use of antithrombotics/antiplatelets: Secondary | ICD-10-CM | POA: Insufficient documentation

## 2018-12-14 DIAGNOSIS — Z79899 Other long term (current) drug therapy: Secondary | ICD-10-CM | POA: Diagnosis not present

## 2018-12-14 DIAGNOSIS — Z5321 Procedure and treatment not carried out due to patient leaving prior to being seen by health care provider: Secondary | ICD-10-CM

## 2018-12-14 DIAGNOSIS — Z950 Presence of cardiac pacemaker: Secondary | ICD-10-CM | POA: Diagnosis not present

## 2018-12-14 DIAGNOSIS — M79661 Pain in right lower leg: Secondary | ICD-10-CM | POA: Diagnosis not present

## 2018-12-14 DIAGNOSIS — R7989 Other specified abnormal findings of blood chemistry: Secondary | ICD-10-CM

## 2018-12-14 DIAGNOSIS — Y999 Unspecified external cause status: Secondary | ICD-10-CM | POA: Insufficient documentation

## 2018-12-14 DIAGNOSIS — I129 Hypertensive chronic kidney disease with stage 1 through stage 4 chronic kidney disease, or unspecified chronic kidney disease: Secondary | ICD-10-CM | POA: Diagnosis not present

## 2018-12-14 DIAGNOSIS — M7989 Other specified soft tissue disorders: Secondary | ICD-10-CM

## 2018-12-14 DIAGNOSIS — Y939 Activity, unspecified: Secondary | ICD-10-CM | POA: Diagnosis not present

## 2018-12-14 LAB — BASIC METABOLIC PANEL
Anion gap: 9 (ref 5–15)
BUN: 49 mg/dL — ABNORMAL HIGH (ref 8–23)
CO2: 22 mmol/L (ref 22–32)
Calcium: 9.2 mg/dL (ref 8.9–10.3)
Chloride: 103 mmol/L (ref 98–111)
Creatinine, Ser: 2.35 mg/dL — ABNORMAL HIGH (ref 0.44–1.00)
GFR calc Af Amer: 21 mL/min — ABNORMAL LOW (ref >60)
GFR calc non Af Amer: 19 mL/min — ABNORMAL LOW (ref >60)
Glucose, Bld: 104 mg/dL — ABNORMAL HIGH (ref 70–99)
Potassium: 4.9 mmol/L (ref 3.5–5.1)
Sodium: 134 mmol/L — ABNORMAL LOW (ref 135–145)

## 2018-12-14 NOTE — Progress Notes (Signed)
Provider has spoken with cardiology

## 2018-12-14 NOTE — Discharge Instructions (Addendum)
Continue your current medications.  The ultrasound did not show a blood clot in the leg.  The bruising likely will take several weeks to resolve.

## 2018-12-14 NOTE — ED Provider Notes (Signed)
[facility] EMERGENCY DEPARTMENT Provider Note   CSN: [id number] Arrival date & time: 12/14/18  1953    History   Chief Complaint Chief Complaint  Patient presents with   Leg Pain    HPI Kelsey Sandoval is a 85 y.o. female.     HPI Patient presents to the emergency room for evaluation of leg swelling.  Patient states she started having pain and discomfort in her right leg today.  Patient noticed when she went to stand up she had bruising and discomfort in her right leg.  Patient denies any trouble with chest pain or shortness of breath.  She was concerned that she might have a DVT so she came to the ED for evaluation.  Patient has seen her primary care doctor recently.  Outpatient laboratory tests were drawn.  She did have a positive d-dimer a couple days ago.  I do not see where she has had any imaging studies since then. Past Medical History:  Diagnosis Date   Anxiety    Aortic atherosclerosis ([place]) 10/10/2018   Arthritis    Back pain, chronic    Sciatica   Cataracts, bilateral    COPD (chronic obstructive pulmonary disease) ([place]) 10/10/2018   Depression    Dysrhythmia    AFib   Essential hypertension    GERD (gastroesophageal reflux disease)    Hyperlipidemia    Hypothyroidism    Membranous nephropathy determined by biopsy 06/07/2015   Nephrotic syndrome    RHUX   Neuropathy    Osteopenia    Pacemaker    [employer]   PAF (paroxysmal atrial fibrillation) ([place])    Pre-diabetes    Tachycardia-bradycardia syndrome ([place])     Patient Active Problem List   Diagnosis Date Noted   Aortic atherosclerosis ([place]) 10/10/2018   COPD (chronic obstructive pulmonary disease) ([place]) 10/10/2018   Hypertensive urgency 07/31/2018   Atrial fibrillation with RVR ([place]) 01/20/2017   Nausea 01/20/2017   Anxiety 01/20/2017   Acute bronchitis 09/11/2016   Chronic anticoagulation 09/07/2016   Chronic kidney disease (CKD), stage III (moderate) ([place])  09/07/2016   Hereditary and idiopathic peripheral neuropathy 03/05/2016   Major depression 12/04/2015   Major depression in remission ([place]) 09/02/2015   Membranous nephropathy determined by biopsy 06/07/2015   Osteoarthritis of both knees 03/26/2015   Chronic pain syndrome 03/26/2015   Intractable nausea and vomiting    Nausea with vomiting    Malnutrition of moderate degree ([place]) 03/08/2015   UTI (lower urinary tract infection) 03/07/2015   Hypothyroidism 02/14/2015   Disorder of kidney 01/17/2015   Nephrotic syndrome 06/21/2014   Proteinuria 03/29/2014   Osteopenia 03/29/2014   Anemia, iron deficiency 08/22/2013   Mitral insufficiency 06/11/2013   Pacemaker 06/11/2013   Paroxysmal atrial fibrillation ([place]) 09/26/2012   Tachycardia-bradycardia syndrome ([place]) 09/26/2012   Sick sinus syndrome ([place]) 09/26/2012   Systemic hypertension 09/26/2012   Dyslipidemia 09/26/2012    Past Surgical History:  Procedure Laterality Date   ABDOMINAL HYSTERECTOMY     partial-pt has no ovaries   ANTERIOR VITRECTOMY Right 03/11/2018   Procedure: ANTERIOR VITRECTOMY;  Surgeon: Baruch Goldmann, MD;  Location: [facility];  Service: Ophthalmology;  Laterality: Right;   APPENDECTOMY     BACK SURGERY     BIOPSY N/A 01/17/2014   Procedure: BIOPSY;  Surgeon: Rogene Houston, MD;  Location: [facility] SU[facility];  Service: Endoscopy;  Laterality: N/A;   CATARACT EXTRACTION W/PHACO Left 02/28/2015   Procedure: CATARACT EXTRACTION PHACO AND INTRAOCULAR LENS PLACEMENT (IOC);  Surgeon: [name], MD;  Location: [facility];  Service: Ophthalmology;  Laterality: Left;  CDE 18.71   CATARACT EXTRACTION W/PHACO Right 03/11/2018   Procedure: CATARACT EXTRACTION PHACO  AND INTRAOCULAR LENS PLACEMENT RIGHT EYE ;  Surgeon: Baruch Goldmann, MD;  Location: [facility];  Service: Ophthalmology;  Laterality: Right;  CDE: 19.39   COLONOSCOPY  9/05   COLONOSCOPY N/A 10/11/2013   Procedure: COLONOSCOPY;  Surgeon: Rogene Houston, MD;  Location: [facility];  Service: Endoscopy;  Laterality: N/A;  1200   ESOPHAGOGASTRODUODENOSCOPY N/A 01/17/2014   Procedure: ESOPHAGOGASTRODUODENOSCOPY (EGD);  Surgeon: Rogene Houston, MD;  Location: [facility];  Service: Endoscopy;  Laterality: N/A;  230   ESOPHAGOGASTRODUODENOSCOPY N/A 01/02/2016   Procedure: ESOPHAGOGASTRODUODENOSCOPY (EGD);  Surgeon: Rogene Houston, MD;  Location: [facility];  Service: Endoscopy;  Laterality: N/A;  240   INSERT / REPLACE / REMOVE PACEMAKER     2012   Wisdom tooth extracton       OB History    Gravida  2   Para  2   Term  2   Preterm      AB      Living        SAB      TAB      Ectopic      Multiple      Live Births               Home Medications    Prior to Admission medications   Medication Sig Start Date End Date Taking? Authorizing Provider  acetaminophen (TYLENOL) 650 MG CR tablet Take 1,300 mg by mouth every 8 (eight) hours as needed for pain.    [provider]  ALPRAZolam Duanne Moron) 0.5 MG tablet Take 1 tablet (0.5 mg total) by mouth at bedtime. Take one qhs 11/15/18   Cloria Spring, MD  amLODipine (NORVASC) 5 MG tablet Take 1 tablet (5 mg total) by mouth daily. 08/03/18 12/06/18  Kathyrn Drown, MD  budesonide-formoterol (SYMBICORT) 80-4.5 MCG/ACT inhaler Inhale 2 puffs into the lungs 2 (two) times daily. 10/10/18   Kathyrn Drown, MD  busPIRone (BUSPAR) 10 MG tablet Take 1 tablet (10 mg total) by mouth 3 (three) times daily. 11/15/18   Cloria Spring, MD  dexlansoprazole (DEXILANT) 60 MG capsule Take 1 capsule (60 mg total) by mouth daily. 12/12/18   Kathyrn Drown, MD  DULoxetine (CYMBALTA) 60 MG capsule Take 1 capsule (60 mg total) by mouth 2 (two) times daily. 11/15/18   Cloria Spring, MD  gabapentin (NEURONTIN) 300 MG capsule TAKE 1 CAPSULE BY MOUTH IN THE MORNING, 1 CAPSULE IN THE AFTERNOON, AND 2 CAPSULES IN THE EVENING Patient taking differently: Take 300 mg by mouth 3 (three) times  daily.  07/05/18   Kathyrn Drown, MD  hydrALAZINE (APRESOLINE) 25 MG tablet TAKE 1 TABLET BY MOUTH EVERY 8 HOURS 11/28/18   Luking, Elayne Snare, MD  HYDROcodone-acetaminophen (NORCO) 10-325 MG tablet Take one tablet every 4 hours prn pain. Max 6 tablets per day 10/10/18   Kathyrn Drown, MD  HYDROcodone-acetaminophen [facility]) 10-325 MG tablet 1 q4 hours prn 10/10/18   Kathyrn Drown, MD  HYDROcodone-acetaminophen [facility]) 10-325 MG tablet 1 q4 hours prn pain 10/10/18   Kathyrn Drown, MD  hydrOXYzine (ATARAX/VISTARIL) 10 MG tablet One bid prn nausea, caution drowsiness Patient taking differently: Take 10 mg by mouth 2 (two) times daily as needed for nausea or vomiting.  01/19/17   Kathyrn Drown, MD  levothyroxine (SYNTHROID, LEVOTHROID) 50 MCG tablet TAKE 1 TABLET BY MOUTH ONCE DAILY Patient taking differently: Take 50 mcg by mouth daily before breakfast.  07/26/18   Kathyrn Drown, MD  metoprolol succinate (TOPROL-XL) 50 MG 24 hr tablet TAKE 1 TABLET BY MOUTH TWICE DAILY. TAKE WITH OR IMMEDIATELY FOLLOWING A MEAL. 11/14/18   Kathyrn Drown, MD  mirtazapine (REMERON) 15 MG tablet Take 1 tablet (15 mg total) by mouth at bedtime. Patient taking differently: Take 7.5-15 mg by mouth at bedtime as needed (FOR SLEEP).  06/09/18   Cloria Spring, MD  PRADAXA 150 MG CAPS capsule TAKE 1 CAPSULE BY MOUTH TWICE DAILY 08/24/18   Evans Lance, MD  pravastatin (PRAVACHOL) 80 MG tablet TAKE 1 TABLET BY MOUTH ONCE DAILY Patient taking differently: Take 80 mg by mouth every evening.  05/30/18   Kathyrn Drown, MD  PROAIR HFA 108 (90 Base) MCG/ACT inhaler INHALE 2 PUFFS INTO LUNGS EVERY 6 HOURS AS NEEDED FOR WHEEZING OR SHORTNESS OF BREATH 11/21/18   Kathyrn Drown, MD  torsemide (DEMADEX) 20 MG tablet Take 1 tablet (20 mg total) by mouth daily. 12/06/18 03/06/19  Herminio Commons, MD  vitamin B-12 (CYANOCOBALAMIN) 1000 MCG tablet Take 1 tablet (1,000 mcg total) by mouth daily. Patient taking differently: Take 1,000 mcg  by mouth every morning.  03/11/15   Kathie Dike, MD    Family History Family History  Problem Relation Age of Onset   Colon cancer Brother    Anxiety disorder Sister    Depression Sister    Anxiety disorder Sister    Depression Sister     Social History Social History   Tobacco Use   Smoking status: Never Smoker   Smokeless tobacco: Never Used  Substance Use Topics   Alcohol use: No    Alcohol/week: 0.0 standard drinks   Drug use: No     Allergies   Penicillins; Levaquin [levofloxacin]; Sulfa antibiotics; and Zithromax [azithromycin]   Review of Systems Review of Systems  All other systems reviewed and are negative.    Physical Exam Updated Vital Signs BP 134/84    Pulse 84    Temp 98.2 F (36.8 C) (Oral)    Resp 18    Ht 1.753 m (5\' 9" )    Wt 78.5 kg    SpO2 95%    BMI 25.55 kg/m   Physical Exam Vitals signs and nursing note reviewed.  Constitutional:      General: She is not in acute distress.    Appearance: She is well-developed.  HENT:     Head: Normocephalic and atraumatic.     Right Ear: External ear normal.     Left Ear: External ear normal.  Eyes:     General: No scleral icterus.       Right eye: No discharge.        Left eye: No discharge.     Conjunctiva/sclera: Conjunctivae normal.  Neck:     Musculoskeletal: Neck supple.     Trachea: No tracheal deviation.  Cardiovascular:     Rate and Rhythm: Normal rate and regular rhythm.  Pulmonary:     Effort: Pulmonary effort is normal. No respiratory distress.     Breath sounds: Normal breath sounds. No stridor. No wheezing or rales.  Abdominal:     General: Bowel sounds are normal. There is no distension.     Palpations: Abdomen is soft.  Tenderness: There is no abdominal tenderness. There is no guarding or rebound.  Musculoskeletal:        General: No tenderness.  Skin:    General: Skin is warm and dry.     Findings: Bruising present. No rash.     Comments: Right anterior  lower leg appears to have an area of extensive superficial bruising/ecchymoses, no posterior calf tenderness, no surrounding erythema  Neurological:     Mental Status: She is alert.     Cranial Nerves: No cranial nerve deficit (no facial droop, extraocular movements intact, no slurred speech).     Sensory: No sensory deficit.     Motor: No abnormal muscle tone or seizure activity.     Coordination: Coordination normal.      ED Treatments / Results  Labs (all labs ordered are listed, but only abnormal results are displayed) Labs Reviewed - No data to display  EKG None  Radiology US Venous Img Lower Unilateral Right  Result Date: 12/14/2018 CLINICAL DATA:  85 year old female with right lower PAIN AND extremity swelling, abnormal D-dimer. EXAM: RIGHT LOWER EXTREMITY VENOUS DOPPLER ULTRASOUND TECHNIQUE: Gray-scale sonography with graded compression, as well as color Doppler and duplex ultrasound were performed to evaluate the lower extremity deep venous systems from the level of the common femoral vein and including the common femoral, femoral, profunda femoral, popliteal and calf veins including the posterior tibial, peroneal and gastrocnemius veins when visible. The superficial great saphenous vein was also interrogated. Spectral Doppler was utilized to evaluate flow at rest and with distal augmentation maneuvers in the common femoral, femoral and popliteal veins. COMPARISON:  None. FINDINGS: Contralateral Common Femoral Vein: Respiratory phasicity is normal and symmetric with the symptomatic side. No evidence of thrombus. Normal compressibility. Common Femoral Vein: No evidence of thrombus. Normal compressibility, respiratory phasicity and response to augmentation. Saphenofemoral Junction: No evidence of thrombus. Normal compressibility and flow on color Doppler imaging. Profunda Femoral Vein: No evidence of thrombus. Normal compressibility and flow on color Doppler imaging. Femoral Vein: No  evidence of thrombus. Normal compressibility, respiratory phasicity and response to augmentation. Popliteal Vein: No evidence of thrombus. Normal compressibility, respiratory phasicity and response to augmentation. Calf Veins: No evidence of thrombus. Normal compressibility and flow on color Doppler imaging. Venous Reflux:  None. Other Findings: Complex but nonvascular appearing collection in the right popliteal fossa encompasses 3.0 x 1.1 x 1.6 centimeters (image 34). IMPRESSION: 1. No evidence of right lower extremity deep venous thrombosis. 2. Right popliteal fossa 3 cm baker cyst. Electronically Signed   By: Genevie Ann M.D.   On: 12/14/2018 21:31    Procedures Procedures (including critical care time)  Medications Ordered in ED Medications - No data to display   Initial Impression / Assessment and Plan / ED Course  I have reviewed the triage vital signs and the nursing notes.  Pertinent labs & imaging results that were available during my care of the patient were reviewed by me and considered in my medical decision making (see chart for details).   Resented to the emergency room for evaluation of possible DVT.  Doppler study does not show any evidence of DVT.  I suspect patient has a superficial hematoma.  Discussed continuing her current medications for pain.  She can also try applying ice and/or heat.  Final Clinical Impressions(s) / ED Diagnoses   Final diagnoses:  Hematoma    ED Discharge Orders    None       Dorie Rank, MD 12/14/18 [id number]

## 2018-12-14 NOTE — ED Triage Notes (Signed)
Pt reports she was sent by Dr. Lance Sell office for blood work (BMET), pt reports short stay sent her to ER for bruising to lower right leg, pt reports she takes blood thinners and bruising appeared this morning after standing up from chair, pt denies injury, pt reports hx of COPD and CHF

## 2018-12-14 NOTE — ED Triage Notes (Signed)
Pt denies injury, sx onset this am.

## 2018-12-14 NOTE — ED Triage Notes (Signed)
RLE pain with broken blood vessel in leg. Ambulatory.

## 2018-12-14 NOTE — Telephone Encounter (Signed)
Pt has a result note also.

## 2018-12-16 ENCOUNTER — Other Ambulatory Visit: Payer: Self-pay | Admitting: Family Medicine

## 2018-12-16 DIAGNOSIS — N183 Chronic kidney disease, stage 3 unspecified: Secondary | ICD-10-CM

## 2018-12-16 DIAGNOSIS — R413 Other amnesia: Secondary | ICD-10-CM

## 2018-12-16 MED ORDER — ONDANSETRON 8 MG PO TBDP: ORAL_TABLET | ORAL | 2 refills | Status: DC

## 2018-12-16 NOTE — Telephone Encounter (Signed)
May have CT of the head due to cerebrovascular disease and mentation changes and memory dysfunction  I would recommend a consultation with nephrology to be with [facility]  I recommend Zofran for nausea  If she does not already have some 8 mg tablet 1 3 times daily as needed nausea, #18, 2 refills

## 2018-12-16 NOTE — Telephone Encounter (Signed)
CT scan of head placed, Zofran sent to pharmacy and referral to [facility] placed. Daughter would like to be called with appts. Daughter is very thankful of Dr.Scott and wanted to say Thank You

## 2018-12-16 NOTE — Telephone Encounter (Signed)
Contacted daughter [name]. [name] verbalized understanding. [name] states she does not mind going to [facility] but pt has seen a nephrologist in [facility] before. Pt has also see Dr.nachman with [facility] in 2017 and daughter states that pt kidney function was down and doing great. [name] would also like to know would Dramamine be OK or would Zofran be better for patient nausea. [name] would also like to know if provider is wanting to do a CT scan of head with out contrast due to patient not being able to get thoughts together. Also [name] is wanting to be at appt next week but is unsure if she is going to be able to; linda would like to be on phone with provider at office visit.

## 2018-12-19 ENCOUNTER — Other Ambulatory Visit: Payer: Self-pay

## 2018-12-19 ENCOUNTER — Telehealth: Payer: Self-pay | Admitting: Family Medicine

## 2018-12-19 ENCOUNTER — Encounter: Payer: Self-pay | Admitting: Family Medicine

## 2018-12-19 ENCOUNTER — Ambulatory Visit (INDEPENDENT_AMBULATORY_CARE_PROVIDER_SITE_OTHER): Payer: PPO | Admitting: Family Medicine

## 2018-12-19 VITALS — BP 140/92 | Temp 97.5°F

## 2018-12-19 DIAGNOSIS — N183 Chronic kidney disease, stage 3 unspecified: Secondary | ICD-10-CM

## 2018-12-19 DIAGNOSIS — I10 Essential (primary) hypertension: Secondary | ICD-10-CM | POA: Diagnosis not present

## 2018-12-19 DIAGNOSIS — R5383 Other fatigue: Secondary | ICD-10-CM

## 2018-12-19 NOTE — Telephone Encounter (Signed)
Pt's daughter calling to see if it is ok for pt to take iron supplement.

## 2018-12-19 NOTE — Progress Notes (Signed)
   Subjective:    Patient ID: Kelsey Sandoval, female    DOB: Dec 21, 1932, 85 y.o.   MRN: [id number]  HPI Pt here today for 2 week follow up Patient has been taking her medicines feels somewhat drowsy and drained today denies high fever sweats chills.  Taking in liquids to some degree  Also has chronic kidney disease and its possible this could be getting worse she was under the care of [facility] nephrology for membranous disease but she is not been able to go there recently because of transportation issues and frailty of health she needs to get established with local nephrology.  . Pt is weak, nauseated and sick. Pt has taken Zofran. Pt son states she has been sick since last visit.    Review of Systems  Constitutional: Negative for activity change, appetite change and fatigue.  HENT: Negative for congestion and rhinorrhea.   Respiratory: Negative for cough and shortness of breath.   Cardiovascular: Negative for chest pain and leg swelling.  Gastrointestinal: Negative for abdominal pain and diarrhea.  Endocrine: Negative for polydipsia and polyphagia.  Musculoskeletal: Positive for arthralgias and back pain.  Skin: Negative for color change.  Neurological: Negative for dizziness and weakness.  Psychiatric/Behavioral: Negative for behavioral problems and confusion.       Objective:   Physical Exam Vitals signs reviewed.  Constitutional:      General: She is not in acute distress. HENT:     Head: Normocephalic.  Cardiovascular:     Rate and Rhythm: Normal rate and regular rhythm.     Heart sounds: Normal heart sounds. No murmur.  Pulmonary:     Effort: Pulmonary effort is normal.     Breath sounds: Normal breath sounds.  Lymphadenopathy:     Cervical: No cervical adenopathy.  Neurological:     Mental Status: She is alert.  Psychiatric:        Behavior: Behavior normal.           Assessment & Plan:  Patient mildly drowsy which is concerning.  This puts her at increased risk  for falling.  I have talked with the patient and talk with the daughter I recommend for the patient to reduce her pain medicine to 4 a day and over time we may try to reduce this even more  In addition to this I recommend the patient be careful use a walker Also recommended for her to avoid Remeron Reduce her nighttime Xanax to half a tablet Lab work ordered today for renal insufficiency she has membranous kidney disease has referral to [facility] awaiting their consultation  I did discuss with family how we will try to manage this patient via phone over the next few weeks and then see her back in a couple months

## 2018-12-19 NOTE — Patient Instructions (Addendum)
As we discussed Reduce the pain medicine Hydrocodone 1 tablet may be taken up to 4 times per day Xanax at nighttime should be a half a tablet at nighttime Stop Remeron for now Please do your lab work Your daughter will give [place] a follow-up toward the end of this week We will do the best we can to manage you by phone We can see you in person if need be Follow-up will be in approximately 4 to 6 weeks But it is hard to predict on that one

## 2018-12-20 ENCOUNTER — Encounter: Payer: Self-pay | Admitting: Family Medicine

## 2018-12-20 LAB — CBC WITH DIFFERENTIAL/PLATELET
Basophils Absolute: 0 10*3/uL (ref 0.0–0.2)
Basos: 0 %
EOS (ABSOLUTE): 0.1 10*3/uL (ref 0.0–0.4)
Eos: 1 %
Hematocrit: 40.6 % (ref 34.0–46.6)
Hemoglobin: 13.6 g/dL (ref 11.1–15.9)
Immature Grans (Abs): 0 10*3/uL (ref 0.0–0.1)
Immature Granulocytes: 0 %
Lymphocytes Absolute: 1.6 10*3/uL (ref 0.7–3.1)
Lymphs: 17 %
MCH: 29.6 pg (ref 26.6–33.0)
MCHC: 33.5 g/dL (ref 31.5–35.7)
MCV: 88 fL (ref 79–97)
MONOS ABS: 0.7 10*3/uL (ref 0.1–0.9)
Monocytes: 7 %
Neutrophils Absolute: 7.2 10*3/uL — ABNORMAL HIGH (ref 1.4–7.0)
Neutrophils: 75 %
Platelets: 263 10*3/uL (ref 150–450)
RBC: 4.6 x10E6/uL (ref 3.77–5.28)
RDW: 13.7 % (ref 11.7–15.4)
WBC: 9.7 10*3/uL (ref 3.4–10.8)

## 2018-12-20 LAB — BASIC METABOLIC PANEL
BUN/Creatinine Ratio: 17 (ref 12–28)
BUN: 34 mg/dL — ABNORMAL HIGH (ref 8–27)
CO2: 25 mmol/L (ref 20–29)
Calcium: 9.2 mg/dL (ref 8.7–10.3)
Chloride: 101 mmol/L (ref 96–106)
Creatinine, Ser: 2.03 mg/dL — ABNORMAL HIGH (ref 0.57–1.00)
GFR calc Af Amer: 26 mL/min/{1.73_m2} — ABNORMAL LOW (ref >59)
GFR calc non Af Amer: 22 mL/min/{1.73_m2} — ABNORMAL LOW (ref >59)
Glucose: 106 mg/dL — ABNORMAL HIGH (ref 65–99)
Potassium: 4.9 mmol/L (ref 3.5–5.2)
Sodium: 141 mmol/L (ref 134–144)

## 2018-12-21 ENCOUNTER — Encounter: Payer: Self-pay | Admitting: Family Medicine

## 2018-12-21 ENCOUNTER — Telehealth: Payer: Self-pay | Admitting: Family Medicine

## 2018-12-21 NOTE — Telephone Encounter (Signed)
Spoke with daughter [name] this morning and let her know that pt does not need to take iron supplement. See result note from today.

## 2018-12-21 NOTE — Telephone Encounter (Signed)
I do not recommend iron supplement please send my chart message

## 2018-12-21 NOTE — Telephone Encounter (Signed)
Need 12/19/2018 OV note completed & signed so I may send with nephrology referral   Please advise

## 2018-12-21 NOTE — Telephone Encounter (Signed)
Was completed thank you 

## 2018-12-27 ENCOUNTER — Other Ambulatory Visit: Payer: Self-pay

## 2018-12-27 ENCOUNTER — Encounter: Payer: Self-pay | Admitting: Family Medicine

## 2018-12-27 MED ORDER — ONDANSETRON 8 MG PO TBDP: ORAL_TABLET | ORAL | 2 refills | Status: DC

## 2018-12-27 NOTE — Telephone Encounter (Signed)
Nurses Please inform family may send as my chart message as well as act upon the following- we can reschedule the CT scan-essentially put on hold until the restrictions are lifted. Please  keep a written note on this so that this does not fall through the cracks in regards to getting it rescheduled once the restrictions are lifted by the health system  She may have refills on Zofran.  This can help with the nausea  As for the pain medicine the next step I would recommend would be they do reduce to cut it down to 3.5 tablets/day then after a period of time can drop it to 3/day  It is okay to use melatonin to help sleep at night eventually we will need to reduce alprazolam from 0.5 mg down to 0.25 mg each evening but for now may keep it as 0.5

## 2018-12-29 ENCOUNTER — Ambulatory Visit (HOSPITAL_COMMUNITY): Payer: PPO

## 2018-12-29 ENCOUNTER — Ambulatory Visit: Payer: PPO | Admitting: Family Medicine

## 2019-01-04 ENCOUNTER — Encounter (HOSPITAL_COMMUNITY): Payer: Self-pay

## 2019-01-09 ENCOUNTER — Ambulatory Visit: Payer: PPO | Admitting: Family Medicine

## 2019-01-18 ENCOUNTER — Other Ambulatory Visit: Payer: Self-pay | Admitting: *Deleted

## 2019-01-18 ENCOUNTER — Telehealth: Payer: Self-pay | Admitting: Family Medicine

## 2019-01-18 MED ORDER — TORSEMIDE 20 MG PO TABS: ORAL_TABLET | ORAL | 1 refills | Status: DC

## 2019-01-18 MED ORDER — GABAPENTIN 300 MG PO CAPS: 300.0000 mg | ORAL_CAPSULE | Freq: Three times a day (TID) | ORAL | 0 refills | Status: DC

## 2019-01-18 NOTE — Telephone Encounter (Signed)
Patient's daughter [name] wanting to know if patient is on her correct dosage of hydralazine 25 mg because she was taking it 3 times a day and her new bottle said one time a day also had questions about her fluid pill,dabapentin 300 mg because her heart doctor changed dosage and you have changed dosage and she wants to know the correct dosage. Patient needs refill on her hydrocodone 10/325 she has 3 pills left. I told daughter you might need a phone visit with patient. Please Advise

## 2019-01-18 NOTE — Telephone Encounter (Signed)
Discussed with pt's daughter and she verbalized understanding. Med list updated and changes sent to pharm.

## 2019-01-18 NOTE — Telephone Encounter (Signed)
Advised that hydrocodone should be on file at the pharm. [name] states she will call [facility]. Daughter wanted to let dr scott know she is down to 3 and a half a day and taking one tylenol 325mg  per day. Wants to know if she can go to 3 tabs daily and use a 650mg  tylenol or if she should continue what she has been taking. States she does still complain of some pain but has not had any sob since decreasing pain med.    Also discussed with her that hydralazine 25mg  in chart was one tid and then she said yes that is how she is taking,   [name] states dr scott spoke with cardiology about changing torsemide 20mg  from one daily to one and a half daily. She has had to go back to one daily so she wont run out of pills. Can a new script with new directions go to pharm.   [name] states pt stopped remeron and gabapentin was reduced but her leg was twitching really bad and pt states she could not stand it so she increased herself back up to gabapentin 300mg  one tid. Daughter wants to know if this is ok  Pt states zofran is causing her to have a headache and has been using draminine instead. States she feels nausea in the mornings. Daughter is not sure how much she is taking per day.   Pt has a follow up on April 27th.

## 2019-01-18 NOTE — Telephone Encounter (Signed)
1.  As for the Dramamine it is okay to use this but I would not use it excessively because it can cause drowsiness  As for the gabapentin it is fine for her to take it make sure the medication list reflects it  As for the Lasix we may redo the prescription to be 1-1/2 in the morning  Lets keep the follow-up visit as planned We can always move it sooner if problems  As for the pain medicine it is fine to do 3 a day with 1 650 milligrams Tylenol as discussed in the message

## 2019-01-23 ENCOUNTER — Other Ambulatory Visit: Payer: Self-pay | Admitting: Family Medicine

## 2019-01-30 ENCOUNTER — Other Ambulatory Visit: Payer: Self-pay

## 2019-01-30 ENCOUNTER — Ambulatory Visit (INDEPENDENT_AMBULATORY_CARE_PROVIDER_SITE_OTHER): Payer: PPO | Admitting: Family Medicine

## 2019-01-30 DIAGNOSIS — G894 Chronic pain syndrome: Secondary | ICD-10-CM

## 2019-01-30 DIAGNOSIS — E785 Hyperlipidemia, unspecified: Secondary | ICD-10-CM | POA: Diagnosis not present

## 2019-01-30 DIAGNOSIS — R5383 Other fatigue: Secondary | ICD-10-CM | POA: Diagnosis not present

## 2019-01-30 DIAGNOSIS — N183 Chronic kidney disease, stage 3 unspecified: Secondary | ICD-10-CM

## 2019-01-30 DIAGNOSIS — I10 Essential (primary) hypertension: Secondary | ICD-10-CM

## 2019-01-30 MED ORDER — TRIAMCINOLONE ACETONIDE 0.1 % EX CREA: 1.0000 "application " | TOPICAL_CREAM | Freq: Two times a day (BID) | CUTANEOUS | 2 refills | Status: DC | PRN

## 2019-01-30 NOTE — Progress Notes (Addendum)
   Subjective:    Patient ID: Kelsey Sandoval, female    DOB: [birth year], 85 y.o.   MRN: [id number]  HPI  Format- phone  Patient present at home Provider present at office Consent for interaction obtained Coronavirus outbreak made virtual visit necessary   Patient calls for a follow up on SOB and pain. Daughter states she is doing better and not as SOB and has decreased her pain meds and gabapentin. Patient does continue to complain of constant fatigue. Patient will not take a shower and daughter concerned about that.   Very nice patient Having difficult time with fatigue and tiredness gives out fairly easily She has some multitude of chronic health issues that are taking its toll on the patient including CHF mild COPD as well as frailty with age and chronic pain She also has chronic kidney disease as well she is doing the best she can at eating healthy she is trying to get up and walk multiple times a day but not doing any true exercise just more so what she needs to do within the house she denies fevers chest pain shortness of breath bleeding issues vomiting  We have tried to taper down on her gabapentin and we have gotten the gabapentin down to 3 times daily that is the lowest amount that she is been able to tolerate if it goes below that she gets restless legs and leg pain  As for her pain we have tried to taper down on this and her daughter is gotten her down to 3 tablets a day but she states some days she hurts terribly Virtual Visit via Video Note  I connected with Kelsey Sandoval on 01/30/19 at  1:10 PM EDT by a video enabled telemedicine application and verified that I am speaking with the correct person using two identifiers.   I discussed the limitations of evaluation and management by telemedicine and the availability of in person appointments. The patient expressed understanding and agreed to proceed.  History of Present Illness:    Observations/Objective:   Assessment  and Plan:   Follow Up Instructions:    I discussed the assessment and treatment plan with the patient. The patient was provided an opportunity to ask questions and all were answered. The patient agreed with the plan and demonstrated an understanding of the instructions.   The patient was advised to call back or seek an in-person evaluation if the symptoms worsen or if the condition fails to improve as anticipated.  I provided 15 minutes of non-face-to-face time during this encounter.    Review of Systems     Objective:   Physical Exam        Assessment & Plan:  Leg pain-chronic back pain-I do not recommend any injections for the next month because of coronavirus then she can see her specialist as for the pain medicine I would recommend 3 no more than 4/day  As for the gabapentin 3/day seems reasonable  Because of the fatigue and tiredness additional lab work but not to do these until June with a follow-up office visit in June encourage the patient to get some protein in her diet drink plenty liquids and to stay physically active  Follow-up in June

## 2019-01-30 NOTE — Addendum Note (Signed)
Addended by: Dairl Ponder on: 01/30/2019 02:46 PM   Modules accepted: Orders

## 2019-02-01 ENCOUNTER — Telehealth: Payer: Self-pay | Admitting: Student

## 2019-02-01 NOTE — Telephone Encounter (Signed)
Virtual Visit Pre-Appointment Phone Call  "(Name), I am calling you today to discuss your upcoming appointment. We are currently trying to limit exposure to the virus that causes COVID-19 by seeing patients at home rather than in the office."  1. "What is the BEST phone number to call the day of the visit?" - include this in appointment notes  2. Do you have or have access to (through a family member/friend) a smartphone with video capability that we can use for your visit?" a. If yes - list this number in appt notes as cell (if different from BEST phone #) and list the appointment type as a VIDEO visit in appointment notes b. If no - list the appointment type as a PHONE visit in appointment notes  3. Confirm consent - "In the setting of the current Covid19 crisis, you are scheduled for a (phone or video) visit with your provider on (date) at (time).  Just as we do with many in-office visits, in order for you to participate in this visit, we must obtain consent.  If you'd like, I can send this to your mychart (if signed up) or email for you to review.  Otherwise, I can obtain your verbal consent now.  All virtual visits are billed to your insurance company just like a normal visit would be.  By agreeing to a virtual visit, we'd like you to understand that the technology does not allow for your provider to perform an examination, and thus may limit your provider's ability to fully assess your condition. If your provider identifies any concerns that need to be evaluated in person, we will make arrangements to do so.  Finally, though the technology is pretty good, we cannot assure that it will always work on either your or our end, and in the setting of a video visit, we may have to convert it to a phone-only visit.  In either situation, we cannot ensure that we have a secure connection.  Are you willing to proceed?" STAFF: Did the patient verbally acknowledge consent to telehealth visit? Document  YES/NO here: Yes  4. Advise patient to be prepared - "Two hours prior to your appointment, go ahead and check your blood pressure, pulse, oxygen saturation, and your weight (if you have the equipment to check those) and write them all down. When your visit starts, your provider will ask you for this information. If you have an Apple Watch or Kardia device, please plan to have heart rate information ready on the day of your appointment. Please have a pen and paper handy nearby the day of the visit as well."  5. Give patient instructions for MyChart download to smartphone OR [email or web address] as below if video visit (depending on what platform provider is using)  6. Inform patient they will receive a phone call 15 minutes prior to their appointment time (may be from unknown caller ID) so they should be prepared to answer    TELEPHONE CALL NOTE  Kelsey Sandoval has been deemed a candidate for a follow-up tele-health visit to limit community exposure during the Covid-19 pandemic. I spoke with the patient via phone to ensure availability of phone/video source, confirm preferred email & phone number, and discuss instructions and expectations.  I reminded Kelsey Sandoval to be prepared with any vital sign and/or heart rhythm information that could potentially be obtained via home monitoring, at the time of her visit. I reminded Kelsey Sandoval to expect a phone call prior to  her visit.  Terry L Goins 02/01/2019 10:09 AM

## 2019-02-06 ENCOUNTER — Other Ambulatory Visit (HOSPITAL_COMMUNITY): Payer: Self-pay | Admitting: Psychiatry

## 2019-02-06 ENCOUNTER — Telehealth (HOSPITAL_COMMUNITY): Payer: Self-pay | Admitting: *Deleted

## 2019-02-06 MED ORDER — ALPRAZOLAM 0.5 MG PO TABS: 0.5000 mg | ORAL_TABLET | Freq: Every day | ORAL | 2 refills | Status: DC

## 2019-02-06 NOTE — Telephone Encounter (Signed)
sent 

## 2019-02-06 NOTE — Telephone Encounter (Signed)
Dr Harrington Challenger Patient called for a refill on Xanax next appointment is 02/23/2019. Per RX last refill was on 01/12/2019

## 2019-02-07 ENCOUNTER — Telehealth: Payer: Self-pay | Admitting: *Deleted

## 2019-02-07 ENCOUNTER — Telehealth (INDEPENDENT_AMBULATORY_CARE_PROVIDER_SITE_OTHER): Payer: PPO | Admitting: Student

## 2019-02-07 ENCOUNTER — Encounter: Payer: Self-pay | Admitting: Student

## 2019-02-07 VITALS — BP 138/70 | HR 80 | Ht 69.0 in | Wt 154.0 lb

## 2019-02-07 DIAGNOSIS — Z7189 Other specified counseling: Secondary | ICD-10-CM

## 2019-02-07 DIAGNOSIS — N183 Chronic kidney disease, stage 3 unspecified: Secondary | ICD-10-CM

## 2019-02-07 DIAGNOSIS — I495 Sick sinus syndrome: Secondary | ICD-10-CM

## 2019-02-07 DIAGNOSIS — I340 Nonrheumatic mitral (valve) insufficiency: Secondary | ICD-10-CM

## 2019-02-07 DIAGNOSIS — I4821 Permanent atrial fibrillation: Secondary | ICD-10-CM

## 2019-02-07 DIAGNOSIS — I5189 Other ill-defined heart diseases: Secondary | ICD-10-CM | POA: Diagnosis not present

## 2019-02-07 DIAGNOSIS — I10 Essential (primary) hypertension: Secondary | ICD-10-CM

## 2019-02-07 NOTE — Telephone Encounter (Signed)
LMOVM for pt to return call 

## 2019-02-07 NOTE — Patient Instructions (Signed)
Medication Instructions:  Your physician recommends that you continue on your current medications as directed. Please refer to the Current Medication list given to you today.   Labwork: NONE  Testing/Procedures: NONE  Follow-Up: Your physician recommends that you schedule a follow-up appointment in: 3-4 Months with Dr. Bronson Ing   Any Other Special Instructions Will Be Listed Below (If Applicable). Someone from the [facility] with contact you regarding your remote check.     If you need a refill on your cardiac medications before your next appointment, please call your pharmacy.  Thank you for choosing [facility]!

## 2019-02-07 NOTE — Telephone Encounter (Signed)
Pt needs remote device check appt. See office visit from 02/07/2019. Please call and schedule.

## 2019-02-07 NOTE — Progress Notes (Signed)
Virtual Visit via Telephone Note   This visit type was conducted due to national recommendations for restrictions regarding the COVID-19 Pandemic (e.g. social distancing) in an effort to limit this patient's exposure and mitigate transmission in our community.  Due to her co-morbid illnesses, this patient is at least at moderate risk for complications without adequate follow up.  This format is felt to be most appropriate for this patient at this time.  The patient did not have access to video technology/had technical difficulties with video requiring transitioning to audio format only (telephone).  All issues noted in this document were discussed and addressed.  No physical exam could be performed with this format.  Please refer to the patient's chart for her  consent to telehealth for [facility].   Date:  02/07/2019   ID:  Kelsey Sandoval, DOB 21-Aug-1933, MRN [id number]  Patient Location: Home Provider Location: Home  PCP:  Kathyrn Drown, MD  Cardiologist:  Kate Sable, MD  Electrophysiologist:  Cristopher Peru, MD   Evaluation Performed:  Follow-Up Visit  Chief Complaint:  Fatigue; Nausea  History of Present Illness:    Kelsey Sandoval is a 85 y.o. female with past medical history of permanentatrial fibrillation (on Pradaxa for anticoagulation), chronic diastolic CHF, tachy-brady syndrome (s/p Medtronic PPM placement),mitral regurgitation,HTN, HLD, and Stage 3 CKD who presents for a 8-month follow-up telehealth visit.   She was last examined by Dr. Bronson Ing in 12/2018 and reported having continual dyspnea on exertion but denied any recent change in her symptoms. Weight was stable at 173 lbs and she was continued on Torsemide 40mg  daily alternating with 20mg  daily. She has followed up with her PCP in the interim and Torsemide was adjusted to 30mg  daily. Creatinine was previously 1.4 - 1.5, peaking at 2.97 on 12/06/2018. This had improved to 2.03 on 12/19/2018. She has been  referred to [facility] by her PCP.   In talking with the patient today, she reports having intermittent nausea for the past month. Not necessarily triggered by food consumption. Denies any associated chest pain or dyspnea. No recent melena or hematochezia. She says her main symptoms prior to needing PPM placement was nausea so she has been concerned her symptoms might be of a cardiac etiology.   She reports good compliance with Torsemide and has been taking 30mg  daily. Says weight has overall been stable on her home scales. No recent orthopnea or PND.   She does report having neuropathy and symptoms worsened when Gabapentin was reduced by her PCP. This was recently increased and she reports some improvement in her symptoms.   The patient does not have symptoms concerning for COVID-19 infection (fever, chills, cough, or new shortness of breath).    Past Medical History:  Diagnosis Date  . Anxiety   . Aortic atherosclerosis ([place]) 10/10/2018  . Arthritis   . Back pain, chronic    Sciatica  . Cataracts, bilateral   . COPD (chronic obstructive pulmonary disease) ([place]) 10/10/2018  . Depression   . Dysrhythmia    AFib  . Essential hypertension   . GERD (gastroesophageal reflux disease)   . Hyperlipidemia   . Hypothyroidism   . Membranous nephropathy determined by biopsy 06/07/2015  . Nephrotic syndrome    RHUX  . Neuropathy   . Osteopenia   . Pacemaker    Medtronic  . PAF (paroxysmal atrial fibrillation) ([place])   . Pre-diabetes   . Tachycardia-bradycardia syndrome [facility])    Past Surgical History:  Procedure Laterality  Date  . ABDOMINAL HYSTERECTOMY     partial-pt has no ovaries  . ANTERIOR VITRECTOMY Right 03/11/2018   Procedure: ANTERIOR VITRECTOMY;  Surgeon: Baruch Goldmann, MD;  Location: [facility] ORS;  Service: Ophthalmology;  Laterality: Right;  . APPENDECTOMY    . BACK SURGERY    . BIOPSY N/A 01/17/2014   Procedure: BIOPSY;  Surgeon: Rogene Houston, MD;  Location: [facility] ENDO SUITE;   Service: Endoscopy;  Laterality: N/A;  . CATARACT EXTRACTION W/PHACO Left 02/28/2015   Procedure: CATARACT EXTRACTION PHACO AND INTRAOCULAR LENS PLACEMENT (IOC);  Surgeon: [name], MD;  Location: [facility] ORS;  Service: Ophthalmology;  Laterality: Left;  CDE 18.71  . CATARACT EXTRACTION W/PHACO Right 03/11/2018   Procedure: CATARACT EXTRACTION PHACO  AND INTRAOCULAR LENS PLACEMENT RIGHT EYE ;  Surgeon: Baruch Goldmann, MD;  Location: [facility] ORS;  Service: Ophthalmology;  Laterality: Right;  CDE: 19.39  . COLONOSCOPY  9/05  . COLONOSCOPY N/A 10/11/2013   Procedure: COLONOSCOPY;  Surgeon: Rogene Houston, MD;  Location: [facility];  Service: Endoscopy;  Laterality: N/A;  1200  . ESOPHAGOGASTRODUODENOSCOPY N/A 01/17/2014   Procedure: ESOPHAGOGASTRODUODENOSCOPY (EGD);  Surgeon: Rogene Houston, MD;  Location: [facility];  Service: Endoscopy;  Laterality: N/A;  230  . ESOPHAGOGASTRODUODENOSCOPY N/A 01/02/2016   Procedure: ESOPHAGOGASTRODUODENOSCOPY (EGD);  Surgeon: Rogene Houston, MD;  Location: [facility];  Service: Endoscopy;  Laterality: N/A;  240  . INSERT / REPLACE / REMOVE PACEMAKER     2012  . Wisdom tooth extracton       Current Meds  Medication Sig  . acetaminophen (TYLENOL) 650 MG CR tablet Take 1,300 mg by mouth every 8 (eight) hours as needed for pain.  Marland Kitchen ALPRAZolam (XANAX) 0.5 MG tablet Take 1 tablet (0.5 mg total) by mouth at bedtime. Take one qhs  . amLODipine (NORVASC) 5 MG tablet Take 1 tablet (5 mg total) by mouth daily.  . budesonide-formoterol (SYMBICORT) 80-4.5 MCG/ACT inhaler Inhale 2 puffs into the lungs 2 (two) times daily.  . busPIRone (BUSPAR) 10 MG tablet Take 1 tablet (10 mg total) by mouth 3 (three) times daily.  Marland Kitchen dexlansoprazole (DEXILANT) 60 MG capsule Take 1 capsule (60 mg total) by mouth daily.  . DULoxetine (CYMBALTA) 60 MG capsule Take 1 capsule (60 mg total) by mouth 2 (two) times daily.  Marland Kitchen gabapentin (NEURONTIN) 300 MG capsule Take 1 capsule (300 mg total) by  mouth 3 (three) times daily.  . hydrALAZINE (APRESOLINE) 25 MG tablet TAKE 1 TABLET BY MOUTH EVERY 8 HOURS  . HYDROcodone-acetaminophen (NORCO) 10-325 MG tablet Take one tablet every 4 hours prn pain. Max 6 tablets per day  . hydrOXYzine (ATARAX/VISTARIL) 10 MG tablet One bid prn nausea, caution drowsiness (Patient taking differently: Take 10 mg by mouth 2 (two) times daily as needed for nausea or vomiting. )  . levothyroxine (SYNTHROID) 50 MCG tablet Take 1 tablet (50 mcg total) by mouth daily before breakfast.  . metoprolol succinate (TOPROL-XL) 50 MG 24 hr tablet TAKE 1 TABLET BY MOUTH TWICE DAILY. TAKE WITH OR IMMEDIATELY FOLLOWING A MEAL.  . mirtazapine (REMERON) 15 MG tablet Take 1 tablet (15 mg total) by mouth at bedtime.  . ondansetron (ZOFRAN ODT) 8 MG disintegrating tablet Take one tablet three times daily as needed for nausea  . PRADAXA 150 MG CAPS capsule TAKE 1 CAPSULE BY MOUTH TWICE DAILY  . pravastatin (PRAVACHOL) 80 MG tablet Take 1 tablet (80 mg total) by mouth every evening.  Marland Kitchen PROAIR HFA  108 (90 Base) MCG/ACT inhaler INHALE 2 PUFFS INTO LUNGS EVERY 6 HOURS AS NEEDED FOR WHEEZING OR SHORTNESS OF BREATH  . torsemide (DEMADEX) 20 MG tablet Take one and one half tablet by mouth each morning.  . triamcinolone cream (KENALOG) 0.1 % Apply 1 application topically 2 (two) times daily as needed.  . vitamin B-12 (CYANOCOBALAMIN) 1000 MCG tablet Take 1 tablet (1,000 mcg total) by mouth daily. (Patient taking differently: Take 1,000 mcg by mouth every morning. )     Allergies:   Penicillins; Levaquin [levofloxacin]; Sulfa antibiotics; and Zithromax [azithromycin]   Social History   Tobacco Use  . Smoking status: Never Smoker  . Smokeless tobacco: Never Used  Substance Use Topics  . Alcohol use: No    Alcohol/week: 0.0 standard drinks  . Drug use: No     Family Hx: The patient's family history includes Anxiety disorder in her sister and sister; Colon cancer in her brother;  Depression in her sister and sister.  ROS:   Please see the history of present illness.     All other systems reviewed and are negative.   Prior CV studies:   The following studies were reviewed today:  Echocardiogram: 10/2018 Study Conclusions  - Left ventricle: The cavity size was normal. Wall thickness was   increased increased in a pattern of mild to moderate LVH.   Systolic function was normal. The estimated ejection fraction was   in the range of 60% to 65%. Wall motion was normal; there were no   regional wall motion abnormalities. The study is not technically   sufficient to allow evaluation of LV diastolic function. - Aortic valve: There was mild regurgitation. Valve area (VTI):   2.41 cm^2. Valve area (Vmax): 2.13 cm^2. - Left atrium: The atrium was severely dilated. - Right ventricle: The cavity size was mildly dilated. - Right atrium: The atrium was moderately dilated.  Labs/Other Tests and Data Reviewed:    EKG:  No ECG reviewed.  Recent Labs: 02/16/2018: TSH 3.140 08/01/2018: ALT 11 12/12/2018: B Natriuretic Peptide 940.0 12/19/2018: BUN 34; Creatinine, Ser 2.03; Hemoglobin 13.6; Platelets 263; Potassium 4.9; Sodium 141   Recent Lipid Panel Lab Results  Component Value Date/Time   CHOL 187 11/25/2015 09:55 AM   TRIG 209 (H) 11/25/2015 09:55 AM   HDL 58 11/25/2015 09:55 AM   CHOLHDL 3.2 11/25/2015 09:55 AM   CHOLHDL 3.6 10/08/2014 09:40 AM   LDLCALC 87 11/25/2015 09:55 AM    Wt Readings from Last 3 Encounters:  02/07/19 154 lb (69.9 kg)  12/14/18 173 lb (78.5 kg)  12/12/18 173 lb (78.5 kg)     Objective:    Vital Signs:  BP 138/70   Pulse 80   Ht 5\' 9"  (1.753 m)   Wt 154 lb (69.9 kg)   BMI 22.74 kg/m    General: Pleasant, female sounding in NAD Psych: Normal affect. Neuro: Alert and oriented X 3. Lungs:  Resp regular and unlabored while talking on the phone.   ASSESSMENT & PLAN:    1. Chronic Diastolic CHF - she reports that overall  weight has been stable on her home scales and she denies any orthopnea, PND, or edema.  - will continue on Torsemide 30mg  daily. Has been referred to Nephrology as outlined below and is scheduled for repeat labs in the coming weeks. I reviewed with her that if her nausea does not start to improve, I would recommend obtaining these sooner for closer evaluation of her renal function and  electrolytes. She wishes to hold off on labs if possible due to the current COVID-19 situation.   2. Permanent Atrial Fibrillation/Use of Long-Term Anticoagulation - she denies any recent palpitations and HR has been well-controlled when checked at home. Continue Toprol-XL 50mg  BID for rate-control.  - she does experience easy bruising which has occurred for several years but denies any melena, hematochezia, or hematuria. Continue Pradaxa for anticoagulation.   3. Tachy-brady Syndrome - s/p Medtronic PPM placement which is followed by Dr. Lovena Le. She reports persistent nausea for the past month as outlined above but denies any associated symptoms. Doubt a cardiac etiology but she notes similar symptoms when she required PPM placement initially. Will reach out to the device clinic to see if a remote interrogation can be arranged.   4. Mitral Regurgitation - Previously documented as moderate to severe but reported as trivial by most recent echocardiogram in 10/2018 as outlined above.  5. HTN - BP has been well-controlled when checked at home, at 138/70 on most recent check. - continue Amlodipine, Hydralazine, and Toprol-XL at current dosing.   6. Stage 3 CKD - Creatinine was previously 1.4 - 1.5, peaking at 2.97 on 12/06/2018. Improved to 2.03 on 12/19/2018. She has been referred to [facility] by her PCP. Has repeat labs scheduled for next month.   7. COVID-19 Education - The signs and symptoms of COVID-19 were discussed with the patient. The importance of social distancing was discussed today.  Time:   Today, I  have spent 19 minutes with the patient with telehealth technology discussing the above problems.     Medication Adjustments/Labs and Tests Ordered: Current medicines are reviewed at length with the patient today.  Concerns regarding medicines are outlined above.   Tests Ordered: No orders of the defined types were placed in this encounter.   Medication Changes: No orders of the defined types were placed in this encounter.   Disposition:  Follow up with Dr. Bronson Ing in 3-4 months.   Signed, Erma Heritage, PA-C  02/07/2019 6:42 PM    [facility]

## 2019-02-08 NOTE — Telephone Encounter (Signed)
Patient agrees to send remote transmission today.

## 2019-02-09 ENCOUNTER — Other Ambulatory Visit: Payer: Self-pay

## 2019-02-09 ENCOUNTER — Ambulatory Visit (INDEPENDENT_AMBULATORY_CARE_PROVIDER_SITE_OTHER): Payer: PPO | Admitting: *Deleted

## 2019-02-09 DIAGNOSIS — I495 Sick sinus syndrome: Secondary | ICD-10-CM

## 2019-02-09 LAB — CUP PACEART REMOTE DEVICE CHECK
Battery Impedance: 540 Ohm
Battery Remaining Longevity: 103 mo
Battery Voltage: 2.79 V
Brady Statistic RV Percent Paced: 10 %
Date Time Interrogation Session: 20200506230653
Implantable Lead Implant Date: 20120810
Implantable Lead Implant Date: 20120810
Implantable Lead Location: 753859
Implantable Lead Location: 753860
Implantable Lead Model: 5076
Implantable Lead Model: 5076
Implantable Pulse Generator Implant Date: 20120810
Lead Channel Impedance Value: 67 Ohm
Lead Channel Impedance Value: 730 Ohm
Lead Channel Pacing Threshold Amplitude: 1 V
Lead Channel Pacing Threshold Pulse Width: 0.4 ms
Lead Channel Setting Pacing Amplitude: 2.5 V
Lead Channel Setting Pacing Pulse Width: 0.4 ms
Lead Channel Setting Sensing Sensitivity: 5.6 mV

## 2019-02-09 NOTE — Telephone Encounter (Signed)
Transmission received.

## 2019-02-14 ENCOUNTER — Encounter: Payer: Self-pay | Admitting: Cardiology

## 2019-02-14 NOTE — Progress Notes (Signed)
Remote pacemaker transmission.   

## 2019-02-20 ENCOUNTER — Telehealth: Payer: Self-pay | Admitting: Internal Medicine

## 2019-02-20 DIAGNOSIS — I480 Paroxysmal atrial fibrillation: Secondary | ICD-10-CM

## 2019-02-20 MED ORDER — DABIGATRAN ETEXILATE MESYLATE 75 MG PO CAPS: 75.0000 mg | ORAL_CAPSULE | Freq: Two times a day (BID) | ORAL | 1 refills | Status: DC

## 2019-02-20 NOTE — Telephone Encounter (Signed)
Talked to patient today 02/20/2019.  Due to CKD and Scr increased to > 2.0 for last 2 months, Pradaxa dose was changed to 75mg  BID.    Patient with diagnosis of AFIB on PRADAXAfor anticoagulation.    Procedure: SPINAL INJECTION Date of procedure: TBD  CHADS2-VASc score of  5 (CHF, HTN, AGE X 2, female)  CrCl = 23 ML/MIN Platelet count 263  Per office protocol, patient can hold PRADAXA for 5 days prior to procedure.    Patient will not need bridging with Lovenox (enoxaparin) around procedure.

## 2019-02-20 NOTE — Telephone Encounter (Signed)
Please comment on pradaxa.

## 2019-02-20 NOTE — Telephone Encounter (Signed)
   Primary Cardiologist: Kate Sable, MD  Chart reviewed as part of pre-operative protocol coverage.  CHARLEA NARDO was last seen on 02/07/19 by Bernerd Pho PAC.  Since that day, HADESSAH GRENNAN has done well.    Per our pharmacy staff: Talked to patient today 02/20/2019.  Due to CKD and Scr increased to > 2.0 for last 2 months, Pradaxa dose was changed to 75mg  BID.   Patient with diagnosis of AFIB on PRADAXAfor anticoagulation.    Procedure: SPINAL INJECTION Date of procedure: TBD  CHADS2-VASc score of  5 (CHF, HTN, AGE X 2, female)  CrCl = 23 ML/MIN Platelet count 263  Per office protocol, patient can hold PRADAXA for 5 days prior to procedure.    Patient will not need bridging with Lovenox (enoxaparin) around procedure.  Therefore, based on ACC/AHA guidelines, the patient would be at acceptable risk for the planned procedure without further cardiovascular testing.   I will route this recommendation to the requesting party via Epic fax function and remove from pre-op pool.  Please call with questions.  [place], PA 02/20/2019, 5:23 PM

## 2019-02-20 NOTE — Telephone Encounter (Signed)
Follow up   Patient states that she is retuning a message that she believes is from the preop team. Please call.

## 2019-02-20 NOTE — Telephone Encounter (Signed)
   [facility] Pre-operative Risk Assessment    Request for surgical clearance:  1. What type of surgery is being performed?  Spinal Injection   2. When is this surgery scheduled? TBD   3. What type of clearance is required (medical clearance vs. Pharmacy clearance to hold med vs. Both)?  Both  4. Are there any medications that need to be held prior to surgery and how long? Pradaxa 4-5 days   5. Practice name and name of physician performing surgery?  [facility]- Dr. Brien Few   6. What is your office phone number? [phone]    7.   What is your office fax number? [phone]  8.   Anesthesia type (None, local, MAC, general) ? Not mentioned  _________________________________________________________________   (provider comments below)

## 2019-02-23 ENCOUNTER — Ambulatory Visit (INDEPENDENT_AMBULATORY_CARE_PROVIDER_SITE_OTHER): Payer: PPO | Admitting: Psychiatry

## 2019-02-23 ENCOUNTER — Other Ambulatory Visit: Payer: Self-pay

## 2019-02-23 ENCOUNTER — Encounter (HOSPITAL_COMMUNITY): Payer: Self-pay | Admitting: Psychiatry

## 2019-02-23 DIAGNOSIS — F322 Major depressive disorder, single episode, severe without psychotic features: Secondary | ICD-10-CM | POA: Diagnosis not present

## 2019-02-23 MED ORDER — BUSPIRONE HCL 10 MG PO TABS: 10.0000 mg | ORAL_TABLET | Freq: Three times a day (TID) | ORAL | 2 refills | Status: DC

## 2019-02-23 MED ORDER — ALPRAZOLAM 0.5 MG PO TABS: 0.5000 mg | ORAL_TABLET | Freq: Two times a day (BID) | ORAL | 2 refills | Status: DC | PRN

## 2019-02-23 MED ORDER — MIRTAZAPINE 15 MG PO TABS: 15.0000 mg | ORAL_TABLET | Freq: Every day | ORAL | 2 refills | Status: DC

## 2019-02-23 MED ORDER — DULOXETINE HCL 60 MG PO CPEP: 60.0000 mg | ORAL_CAPSULE | Freq: Two times a day (BID) | ORAL | 2 refills | Status: DC

## 2019-02-23 NOTE — Progress Notes (Signed)
Virtual Visit via Telephone Note  I connected with Kelsey Sandoval on 02/23/19 at  1:00 PM EDT by telephone and verified that I am speaking with the correct person using two identifiers.   I discussed the limitations, risks, security and privacy concerns of performing an evaluation and management service by telephone and the availability of in person appointments. I also discussed with the patient that there may be a patient responsible charge related to this service. The patient expressed understanding and agreed to proceed.      I discussed the assessment and treatment plan with the patient. The patient was provided an opportunity to ask questions and all were answered. The patient agreed with the plan and demonstrated an understanding of the instructions.   The patient was advised to call back or seek an in-person evaluation if the symptoms worsen or if the condition fails to improve as anticipated.  I provided 15 minutes of non-face-to-face time during this encounter.   Kelsey Spiller, MD  [facility] MD/PA/NP OP Progress Note  02/23/2019 1:21 PM Kelsey Sandoval  MRN:  [id number]  Chief Complaint:  Chief Complaint    Depression; Anxiety; Follow-up     HPI: This patient is an 85 year old divorced white female who lives alone in [place].  She has 2 children and several grandchildren and great-grandchildren.  She is retired from a Research officer, trade [employer].  The patient has a history of depression and anxiety.  She returns after about 8 months.  She is having some cardiac problems and also a lot of sciatic nerve pain down her legs.  A lot of appointments and procedures and had to be put on hold due to the coronavirus.  She is walking with a walker at home.  She states overall she is doing okay because her children and grandchildren help her out a lot by getting food and doing the cleaning.  She talks to her sister and friends on the phone.  She denies serious depression suicidal ideation.  She asked if  she can have an extra Xanax to take during the day because she is a little bit more anxious and sometimes has panic attacks and I think this is reasonable.  I warned her not to take this with her pain medication.  She states that she is eating and sleeping pretty well. Visit Diagnosis:    ICD-10-CM   1. Severe single current episode of major depressive disorder, without psychotic features ([name]) F32.2     Past Psychiatric History:none  Past Medical History:  Past Medical History:  Diagnosis Date  . Anxiety   . Aortic atherosclerosis ([name]) 10/10/2018  . Arthritis   . Back pain, chronic    Sciatica  . Cataracts, bilateral   . COPD (chronic obstructive pulmonary disease) ([name]) 10/10/2018  . Depression   . Dysrhythmia    AFib  . Essential hypertension   . GERD (gastroesophageal reflux disease)   . Hyperlipidemia   . Hypothyroidism   . Membranous nephropathy determined by biopsy 06/07/2015  . Nephrotic syndrome    RHUX  . Neuropathy   . Osteopenia   . Pacemaker    Medtronic  . PAF (paroxysmal atrial fibrillation) ([name])   . Pre-diabetes   . Tachycardia-bradycardia syndrome [facility])     Past Surgical History:  Procedure Laterality Date  . ABDOMINAL HYSTERECTOMY     partial-pt has no ovaries  . ANTERIOR VITRECTOMY Right 03/11/2018   Procedure: ANTERIOR VITRECTOMY;  Surgeon: Baruch Goldmann, MD;  Location: [facility] ORS;  Service:  Ophthalmology;  Laterality: Right;  . APPENDECTOMY    . BACK SURGERY    . BIOPSY N/A 01/17/2014   Procedure: BIOPSY;  Surgeon: Rogene Houston, MD;  Location: [facility] ENDO SUITE;  Service: Endoscopy;  Laterality: N/A;  . CATARACT EXTRACTION W/PHACO Left 02/28/2015   Procedure: CATARACT EXTRACTION PHACO AND INTRAOCULAR LENS PLACEMENT (IOC);  Surgeon: [name], MD;  Location: [facility] ORS;  Service: Ophthalmology;  Laterality: Left;  CDE 18.71  . CATARACT EXTRACTION W/PHACO Right 03/11/2018   Procedure: CATARACT EXTRACTION PHACO  AND INTRAOCULAR LENS PLACEMENT RIGHT EYE ;  Surgeon:  Baruch Goldmann, MD;  Location: [facility] ORS;  Service: Ophthalmology;  Laterality: Right;  CDE: 19.39  . COLONOSCOPY  9/05  . COLONOSCOPY N/A 10/11/2013   Procedure: COLONOSCOPY;  Surgeon: Rogene Houston, MD;  Location: [facility] END[facility] SU[facility];  Service: Endoscopy;  Laterality: N/A;  1200  . ESOPHAGOGASTRODUODENOSCOPY N/A 01/17/2014   Procedure: ESOPHAGOGASTRODUODENOSCOPY (EGD);  Surgeon: Rogene Houston, MD;  Location: [facility];  Service: Endoscopy;  Laterality: N/A;  230  . ESOPHAGOGASTRODUODENOSCOPY N/A 01/02/2016   Procedure: ESOPHAGOGASTRODUODENOSCOPY (EGD);  Surgeon: Rogene Houston, MD;  Location: [facility];  Service: Endoscopy;  Laterality: N/A;  240  . INSERT / REPLACE / REMOVE PACEMAKER     2012  . Wisdom tooth extracton      Family Psychiatric History: see below  Family History:  Family History  Problem Relation Age of Onset  . Colon cancer Brother   . Anxiety disorder Sister   . Depression Sister   . Anxiety disorder Sister   . Depression Sister     Social History:  Social History   Socioeconomic History  . Marital status: Divorced    Spouse name: Not on file  . Number of children: Not on file  . Years of education: Not on file  . Highest education level: Not on file  Occupational History  . Occupation: retired  Scientific laboratory technician  . Financial resource strain: Not on file  . Food insecurity:    Worry: Not on file    Inability: Not on file  . Transportation needs:    Medical: Not on file    Non-medical: Not on file  Tobacco Use  . Smoking status: Never Smoker  . Smokeless tobacco: Never Used  Substance and Sexual Activity  . Alcohol use: No    Alcohol/week: 0.0 standard drinks  . Drug use: No  . Sexual activity: Not Currently    Birth control/protection: None  Lifestyle  . Physical activity:    Days per week: Not on file    Minutes per session: Not on file  . Stress: Not on file  Relationships  . Social connections:    Talks on phone: Not on file    Gets  together: Not on file    Attends religious service: Not on file    Active member of club or organization: Not on file    Attends meetings of clubs or organizations: Not on file    Relationship status: Not on file  Other Topics Concern  . Not on file  Social History Narrative  . Not on file    Allergies:  Allergies  Allergen Reactions  . Penicillins Other (See Comments)    Caused patient to pass out.Can take cephalosporins Has patient had a PCN reaction causing immediate rash, facial/tongue/throat swelling, SOB or lightheadedness with hypotension: no Has patient had a PCN reaction causing severe rash involving mucus membranes or skin necrosis: No Has  patient had a PCN reaction that required hospitalization No Has patient had a PCN reaction occurring within the last 10 years: No If all of the above answers are "NO", then may proceed with Cephalosporin use.   . Levaquin [Levofloxacin] Nausea Only  . Sulfa Antibiotics Other (See Comments)    Unknown  . Zithromax [Azithromycin] Nausea Only and Rash    Metabolic Disorder Labs: No results found for: HGBA1C, MPG No results found for: PROLACTIN Lab Results  Component Value Date   CHOL 187 11/25/2015   TRIG 209 (H) 11/25/2015   HDL 58 11/25/2015   CHOLHDL 3.2 11/25/2015   VLDL 47 (H) 10/08/2014   LDLCALC 87 11/25/2015   LDLCALC 58 10/08/2014   Lab Results  Component Value Date   TSH 3.140 02/16/2018   TSH 3.030 08/16/2017    Therapeutic Level Labs: No results found for: LITHIUM No results found for: VALPROATE No components found for:  CBMZ  Current Medications: Current Outpatient Medications  Medication Sig Dispense Refill  . acetaminophen (TYLENOL) 650 MG CR tablet Take 1,300 mg by mouth every 8 (eight) hours as needed for pain.    Marland Kitchen ALPRAZolam (XANAX) 0.5 MG tablet Take 1 tablet (0.5 mg total) by mouth 2 (two) times daily as needed for anxiety. Take one qhs 60 tablet 2  . amLODipine (NORVASC) 5 MG tablet Take 1 tablet  (5 mg total) by mouth daily. 90 tablet 3  . budesonide-formoterol (SYMBICORT) 80-4.5 MCG/ACT inhaler Inhale 2 puffs into the lungs 2 (two) times daily. 1 Inhaler 12  . busPIRone (BUSPAR) 10 MG tablet Take 1 tablet (10 mg total) by mouth 3 (three) times daily. 270 tablet 2  . dabigatran (PRADAXA) 75 MG CAPS capsule Take 1 capsule (75 mg total) by mouth 2 (two) times daily. DOSE CHANGE. 60 capsule 1  . dexlansoprazole (DEXILANT) 60 MG capsule Take 1 capsule (60 mg total) by mouth daily. 30 capsule 4  . DULoxetine (CYMBALTA) 60 MG capsule Take 1 capsule (60 mg total) by mouth 2 (two) times daily. 180 capsule 2  . gabapentin (NEURONTIN) 300 MG capsule Take 1 capsule (300 mg total) by mouth 3 (three) times daily. 90 capsule 0  . hydrALAZINE (APRESOLINE) 25 MG tablet TAKE 1 TABLET BY MOUTH EVERY 8 HOURS 90 tablet 5  . HYDROcodone-acetaminophen (NORCO) 10-325 MG tablet Take one tablet every 4 hours prn pain. Max 6 tablets per day 180 tablet 0  . hydrOXYzine (ATARAX/VISTARIL) 10 MG tablet One bid prn nausea, caution drowsiness (Patient taking differently: Take 10 mg by mouth 2 (two) times daily as needed for nausea or vomiting. ) 40 tablet 2  . levothyroxine (SYNTHROID) 50 MCG tablet Take 1 tablet (50 mcg total) by mouth daily before breakfast. 90 tablet 0  . metoprolol succinate (TOPROL-XL) 50 MG 24 hr tablet TAKE 1 TABLET BY MOUTH TWICE DAILY. TAKE WITH OR IMMEDIATELY FOLLOWING A MEAL. 180 tablet 1  . mirtazapine (REMERON) 15 MG tablet Take 1 tablet (15 mg total) by mouth at bedtime. 90 tablet 2  . ondansetron (ZOFRAN ODT) 8 MG disintegrating tablet Take one tablet three times daily as needed for nausea 18 tablet 2  . pravastatin (PRAVACHOL) 80 MG tablet Take 1 tablet (80 mg total) by mouth every evening. 90 tablet 0  . PROAIR HFA 108 (90 Base) MCG/ACT inhaler INHALE 2 PUFFS INTO LUNGS EVERY 6 HOURS AS NEEDED FOR WHEEZING OR SHORTNESS OF BREATH 9 g 6  . torsemide (DEMADEX) 20 MG tablet Take one and one  half tablet by mouth each morning. 135 tablet 1  . triamcinolone cream (KENALOG) 0.1 % Apply 1 application topically 2 (two) times daily as needed. 60 g 2  . vitamin B-12 (CYANOCOBALAMIN) 1000 MCG tablet Take 1 tablet (1,000 mcg total) by mouth daily. (Patient taking differently: Take 1,000 mcg by mouth every morning. )     No current facility-administered medications for this visit.      Musculoskeletal: Strength & Muscle Tone: decreased Gait & Station: unsteady Patient leans: N/A  Psychiatric Specialty Exam: Review of Systems  Constitutional: Positive for malaise/fatigue.  Musculoskeletal: Positive for back pain and joint pain.  Psychiatric/Behavioral: The patient is nervous/anxious.   All other systems reviewed and are negative.   There were no vitals taken for this visit.There is no height or weight on file to calculate BMI.  General Appearance: NA  Eye Contact:  NA  Speech:  Clear and Coherent  Volume:  Normal  Mood:  Anxious  Affect:  NA  Thought Process:  Goal Directed  Orientation:  Full (Time, Place, and Person)  Thought Content: Rumination   Suicidal Thoughts:  No  Homicidal Thoughts:  No  Memory:  Immediate;   Good Recent;   Good Remote;   Fair  Judgement:  Fair  Insight:  Fair  Psychomotor Activity:  Decreased  Concentration:  Concentration: Good and Attention Span: Good  Recall:  [place] of Knowledge: Fair  Language: Good  Akathisia:  No  Handed:  Right  AIMS (if indicated): not done  Assets:  Communication Skills Desire for Improvement Resilience Social Support Talents/Skills  ADL's:  Intact  Cognition: WNL  Sleep:  Good   Screenings: GAD-7     Office Visit from 08/29/2018 in [place]  Total GAD-7 Score  1    PHQ2-9     Office Visit from 08/29/2018 in [place] Office Visit from 05/17/2018 in [place] Office Visit from 11/18/2017 in [place] Office Visit from 09/20/2017 in  [place] Office Visit from 01/23/2016 in [facility]  PHQ-2 Total Score  0  4  0  2  0  PHQ-9 Total Score  3  8  -  -  -       Assessment and Plan: This patient is an 85 year old female with a history of depression and anxiety.  She is doing fairly well on her current regimen given all of her other health problems on the coronavirus pandemic.  Because she is more anxious we will increase Xanax to 0.5 mg twice daily as needed.  She will continue Cymbalta 60 mg twice daily for depression, BuSpar 10 mg 3 times daily for anxiety, and mirtazapine 15 mg at bedtime for anxiety and sleep.  She will return to see me in 3 months   Kelsey Spiller, MD 02/23/2019, 1:21 PM

## 2019-02-28 DIAGNOSIS — M5126 Other intervertebral disc displacement, lumbar region: Secondary | ICD-10-CM | POA: Diagnosis not present

## 2019-02-28 DIAGNOSIS — M5416 Radiculopathy, lumbar region: Secondary | ICD-10-CM | POA: Diagnosis not present

## 2019-03-01 ENCOUNTER — Other Ambulatory Visit: Payer: Self-pay

## 2019-03-01 ENCOUNTER — Telehealth: Payer: Self-pay | Admitting: Family Medicine

## 2019-03-01 ENCOUNTER — Ambulatory Visit (INDEPENDENT_AMBULATORY_CARE_PROVIDER_SITE_OTHER): Payer: PPO | Admitting: Family Medicine

## 2019-03-01 DIAGNOSIS — I480 Paroxysmal atrial fibrillation: Secondary | ICD-10-CM | POA: Diagnosis not present

## 2019-03-01 DIAGNOSIS — J439 Emphysema, unspecified: Secondary | ICD-10-CM | POA: Diagnosis not present

## 2019-03-01 DIAGNOSIS — E038 Other specified hypothyroidism: Secondary | ICD-10-CM | POA: Diagnosis not present

## 2019-03-01 MED ORDER — HYDROCODONE-ACETAMINOPHEN 10-325 MG PO TABS: ORAL_TABLET | ORAL | 0 refills | Status: DC

## 2019-03-01 NOTE — Progress Notes (Signed)
Subjective:  Patient unable to do video visit  Patient ID: Kelsey Sandoval, female    DOB: October 16, 1932, 85 y.o.   MRN: [id number]  HPI This patient was seen today for chronic pain The patient and her daughter was on a conference call with [place]. The medication list was reviewed and updated. 25 to 30 minutes was spent with the patient and her daughter discussing the patient issues.  Discussing how the patient is doing was also side effects and potential issues regarding how she is doing. The patient feels her energy level is doing all right.  She does relate getting short of breath when she pushes herself she denies any severe tachycardia issues no passing out spells She seems to be eating okay. She is seeing a psychiatrist for depression and anxiety and she feels at times it is rough on her they have adjusted her medicine.  The daughter has expressed how when the patient is on a lower dose of medicine she seems to be more alert interactive and like her usual self when she is on higher doses sometimes she acts groggy or under the effect of medicine.  In addition to this apparently the patient has had a tendency to take the medication too frequently or too often when giving a larger quantity. We did discuss polypharmacy and also discussed how it is necessary to be honest some of these medicines in order to keep depression and anxiety under control.  I did encourage them to keep their appointments with psychiatry.  -Compliance with medication: Hydrocodone 10-325 mg   - Number patient states they take daily: 3.5-4 a day   -when was the last dose patient took? This morning   The patient was advised the importance of maintaining medication and not using illegal substances with these.  Here for refills and follow up  The patient was educated that we can provide 3 monthly scripts for their medication, it is their responsibility to follow the instructions.  Side effects or complications from  medications: none  Patient is aware that pain medications are meant to minimize the severity of the pain to allow their pain levels to improve to allow for better function. They are aware of that pain medications cannot totally remove their pain.  Due for UDT ( at least once per year) :   Pt states that she is doing much better. Pt daughter states she is not getting as confused as much. Pt daughter states she does have some spots on her arms that look like skin cancer but pt is not sure if that's what this is.  Virtual Visit via Video Note  I connected with Kelsey Sandoval on 03/01/19 at  1:10 PM EDT by a video enabled telemedicine application and verified that I am speaking with the correct person using two identifiers.  Location: Patient: home Provider: office   I discussed the limitations of evaluation and management by telemedicine and the availability of in person appointments. The patient expressed understanding and agreed to proceed.  History of Present Illness:    Observations/Objective:   Assessment and Plan:   Follow Up Instructions:    I discussed the assessment and treatment plan with the patient. The patient was provided an opportunity to ask questions and all were answered. The patient agreed with the plan and demonstrated an understanding of the instructions.   The patient was advised to call back or seek an in-person evaluation if the symptoms worsen or if the condition fails to  improve as anticipated.  I provided 28 minutes of non-face-to-face time during this encounter.   Vicente Males, LPN        Review of Systems  Constitutional: Negative for activity change and appetite change.  HENT: Negative for congestion and rhinorrhea.   Respiratory: Negative for cough and shortness of breath.   Cardiovascular: Negative for chest pain and leg swelling.  Gastrointestinal: Negative for abdominal pain, nausea and vomiting.  Skin: Negative for color change.   Neurological: Negative for dizziness and weakness.  Psychiatric/Behavioral: Negative for agitation and confusion.       Objective:   Physical Exam  Today's visit was via telephone Physical exam was not possible for this visit       Assessment & Plan:  Depression and anxiety I encouraged him to follow through with psychiatry.  Apparently the patient has now agreed to be on just 1 Xanax at night and she states that she will split it in half if necessary.  As best as family can tell blood pressure under good control continue the medication  COPD under good control with inhalers still gets some shortness of breath  Chronic back pain sees Dr. Brien Few for injections is on pain medicine averaging about 4 a day currently some days 5 their goal is to gradually taper down to possibly 3 and half  Thyroid medicine as directed no problems there  Uses diuretic as directed not having any significant swelling in the legs.  We will do a follow-up again in about 6 weeks

## 2019-03-01 NOTE — Telephone Encounter (Signed)
Pt daughter calling office to update provider on pts Xanax ordered. Pt daughter states that mom called Dr.Ross on 02/23/2019 and had a Televist. Dr.Ross had sent in Alprazolam 0.5 mg take 2 tablet po daily. Original order is one po at bedtime. Pt daughter called pharmacy to deactivate the script that Dr.Ross had called in. Daughter wondering if provider could call Dr.Ross and see why this was sent in. Please advise. Thank you

## 2019-03-02 NOTE — Telephone Encounter (Signed)
So noted I would advise family to notify Dr. Alan Ripper office of these changes as well

## 2019-03-02 NOTE — Telephone Encounter (Signed)
Please inform the daughter I will send a communication to Dr. Harrington Challenger

## 2019-03-02 NOTE — Telephone Encounter (Signed)
Discussed wit pt's daughter [name] who is on dpr. She Also wanted dr Nicki Reaper to know they have stopped trazodone and remeron.. trazodone is off of med list but remeron is still on list prescribed by dr Harrington Challenger and currently takine one daily of xanax and sometimes half during the day. Also buspar was cut down from 10mg  one tid to one daily

## 2019-03-02 NOTE — Telephone Encounter (Signed)
Daughter also wants to let you know she cut down buspar 10mg  from one tid to one daily

## 2019-03-20 DIAGNOSIS — N183 Chronic kidney disease, stage 3 (moderate): Secondary | ICD-10-CM | POA: Diagnosis not present

## 2019-03-20 DIAGNOSIS — N022 Recurrent and persistent hematuria with diffuse membranous glomerulonephritis: Secondary | ICD-10-CM | POA: Diagnosis not present

## 2019-03-20 DIAGNOSIS — N179 Acute kidney failure, unspecified: Secondary | ICD-10-CM | POA: Diagnosis not present

## 2019-03-20 DIAGNOSIS — N184 Chronic kidney disease, stage 4 (severe): Secondary | ICD-10-CM | POA: Diagnosis not present

## 2019-03-20 DIAGNOSIS — M549 Dorsalgia, unspecified: Secondary | ICD-10-CM | POA: Diagnosis not present

## 2019-03-20 DIAGNOSIS — R7303 Prediabetes: Secondary | ICD-10-CM | POA: Diagnosis not present

## 2019-03-20 DIAGNOSIS — I129 Hypertensive chronic kidney disease with stage 1 through stage 4 chronic kidney disease, or unspecified chronic kidney disease: Secondary | ICD-10-CM | POA: Diagnosis not present

## 2019-03-20 DIAGNOSIS — G8929 Other chronic pain: Secondary | ICD-10-CM | POA: Diagnosis not present

## 2019-03-29 ENCOUNTER — Other Ambulatory Visit: Payer: Self-pay | Admitting: Family Medicine

## 2019-03-30 DIAGNOSIS — L570 Actinic keratosis: Secondary | ICD-10-CM | POA: Diagnosis not present

## 2019-03-30 DIAGNOSIS — L578 Other skin changes due to chronic exposure to nonionizing radiation: Secondary | ICD-10-CM | POA: Diagnosis not present

## 2019-03-30 DIAGNOSIS — I8312 Varicose veins of left lower extremity with inflammation: Secondary | ICD-10-CM | POA: Diagnosis not present

## 2019-03-30 DIAGNOSIS — L821 Other seborrheic keratosis: Secondary | ICD-10-CM | POA: Diagnosis not present

## 2019-03-30 DIAGNOSIS — I872 Venous insufficiency (chronic) (peripheral): Secondary | ICD-10-CM | POA: Diagnosis not present

## 2019-03-30 DIAGNOSIS — I8311 Varicose veins of right lower extremity with inflammation: Secondary | ICD-10-CM | POA: Diagnosis not present

## 2019-03-30 NOTE — Telephone Encounter (Signed)
Please verify dosing with patient before filling if dosing is correct may have 5 refills

## 2019-03-31 NOTE — Telephone Encounter (Signed)
Pt states this is correct 300mg  one tid. Refills sent

## 2019-04-11 ENCOUNTER — Telehealth: Payer: Self-pay | Admitting: Family Medicine

## 2019-04-11 ENCOUNTER — Other Ambulatory Visit: Payer: Self-pay | Admitting: Family Medicine

## 2019-04-11 DIAGNOSIS — N183 Chronic kidney disease, stage 3 (moderate): Secondary | ICD-10-CM | POA: Diagnosis not present

## 2019-04-11 DIAGNOSIS — I10 Essential (primary) hypertension: Secondary | ICD-10-CM | POA: Diagnosis not present

## 2019-04-11 DIAGNOSIS — R5383 Other fatigue: Secondary | ICD-10-CM | POA: Diagnosis not present

## 2019-04-11 NOTE — Telephone Encounter (Signed)
Patient is requesting refill on hydrocodone 10/325 not due for refill until 7/27

## 2019-04-11 NOTE — Telephone Encounter (Signed)
Patient to contact pharmacy for refill and call us back if any problems.

## 2019-04-11 NOTE — Telephone Encounter (Signed)
Last seen 03/01/2019

## 2019-04-11 NOTE — Telephone Encounter (Signed)
Please advise. Thank you

## 2019-04-11 NOTE — Telephone Encounter (Signed)
According to the drug registry the last time she got this filled was late May but I sent in 3 prescriptions for her at that time I think the patient believes she needs to call us every time she needs a refill The prescription should be available at her pharmacy Please find out from pharmacy if they have the prescriptions on file-they should Verify with them when to be filled then call the patient with the info  If for some reason the pharmacy does not have the prescriptions please notify me

## 2019-04-12 LAB — CBC WITH DIFFERENTIAL/PLATELET
Basophils Absolute: 0 10*3/uL (ref 0.0–0.2)
Basos: 0 %
EOS (ABSOLUTE): 0.1 10*3/uL (ref 0.0–0.4)
Eos: 1 %
Hematocrit: 41.5 % (ref 34.0–46.6)
Hemoglobin: 13.9 g/dL (ref 11.1–15.9)
Immature Grans (Abs): 0 10*3/uL (ref 0.0–0.1)
Immature Granulocytes: 0 %
Lymphocytes Absolute: 1.5 10*3/uL (ref 0.7–3.1)
Lymphs: 16 %
MCH: 29.3 pg (ref 26.6–33.0)
MCHC: 33.5 g/dL (ref 31.5–35.7)
MCV: 87 fL (ref 79–97)
Monocytes Absolute: 0.6 10*3/uL (ref 0.1–0.9)
Monocytes: 7 %
Neutrophils Absolute: 7.1 10*3/uL — ABNORMAL HIGH (ref 1.4–7.0)
Neutrophils: 76 %
Platelets: 243 10*3/uL (ref 150–450)
RBC: 4.75 x10E6/uL (ref 3.77–5.28)
RDW: 13.1 % (ref 11.7–15.4)
WBC: 9.4 10*3/uL (ref 3.4–10.8)

## 2019-04-12 LAB — BASIC METABOLIC PANEL
BUN/Creatinine Ratio: 19 (ref 12–28)
BUN: 25 mg/dL (ref 8–27)
CO2: 25 mmol/L (ref 20–29)
Calcium: 9.6 mg/dL (ref 8.7–10.3)
Chloride: 101 mmol/L (ref 96–106)
Creatinine, Ser: 1.34 mg/dL — ABNORMAL HIGH (ref 0.57–1.00)
GFR calc Af Amer: 42 mL/min/{1.73_m2} — ABNORMAL LOW (ref >59)
GFR calc non Af Amer: 37 mL/min/{1.73_m2} — ABNORMAL LOW (ref >59)
Glucose: 92 mg/dL (ref 65–99)
Potassium: 5.4 mmol/L — ABNORMAL HIGH (ref 3.5–5.2)
Sodium: 142 mmol/L (ref 134–144)

## 2019-04-12 LAB — HEPATIC FUNCTION PANEL
ALT: 10 IU/L (ref 0–32)
AST: 19 IU/L (ref 0–40)
Albumin: 3.9 g/dL (ref 3.6–4.6)
Alkaline Phosphatase: 106 IU/L (ref 39–117)
Bilirubin Total: 0.5 mg/dL (ref 0.0–1.2)
Bilirubin, Direct: 0.18 mg/dL (ref 0.00–0.40)
Total Protein: 6.2 g/dL (ref 6.0–8.5)

## 2019-04-12 LAB — TSH: TSH: 1.93 u[IU]/mL (ref 0.450–4.500)

## 2019-04-21 ENCOUNTER — Other Ambulatory Visit: Payer: Self-pay | Admitting: Family Medicine

## 2019-04-21 ENCOUNTER — Other Ambulatory Visit: Payer: Self-pay | Admitting: Cardiovascular Disease

## 2019-04-21 DIAGNOSIS — I480 Paroxysmal atrial fibrillation: Secondary | ICD-10-CM

## 2019-04-28 ENCOUNTER — Other Ambulatory Visit: Payer: Self-pay | Admitting: Family Medicine

## 2019-05-01 MED ORDER — PRAVASTATIN SODIUM 80 MG PO TABS: 80.0000 mg | ORAL_TABLET | Freq: Every evening | ORAL | 0 refills | Status: AC

## 2019-05-01 MED ORDER — LEVOTHYROXINE SODIUM 50 MCG PO TABS: ORAL_TABLET | ORAL | 0 refills | Status: DC

## 2019-05-01 NOTE — Addendum Note (Signed)
Addended by: Dairl Ponder on: 05/01/2019 11:31 AM   Modules accepted: Orders

## 2019-05-03 ENCOUNTER — Telehealth (HOSPITAL_COMMUNITY): Payer: Self-pay | Admitting: *Deleted

## 2019-05-03 NOTE — Telephone Encounter (Signed)
PATIENT HAS BEEN SCHEDULED FOR 05/05/2019  APPT HAS BEEN HAVING SOME ISSUES

## 2019-05-05 ENCOUNTER — Other Ambulatory Visit: Payer: Self-pay

## 2019-05-05 ENCOUNTER — Ambulatory Visit (INDEPENDENT_AMBULATORY_CARE_PROVIDER_SITE_OTHER): Payer: PPO | Admitting: Psychiatry

## 2019-05-05 ENCOUNTER — Encounter (HOSPITAL_COMMUNITY): Payer: Self-pay | Admitting: Psychiatry

## 2019-05-05 DIAGNOSIS — F322 Major depressive disorder, single episode, severe without psychotic features: Secondary | ICD-10-CM

## 2019-05-05 MED ORDER — BUSPIRONE HCL 10 MG PO TABS: 10.0000 mg | ORAL_TABLET | Freq: Three times a day (TID) | ORAL | 2 refills | Status: DC

## 2019-05-05 MED ORDER — PAROXETINE HCL 20 MG PO TABS: 20.0000 mg | ORAL_TABLET | Freq: Every day | ORAL | 2 refills | Status: DC

## 2019-05-05 MED ORDER — DULOXETINE HCL 60 MG PO CPEP: 60.0000 mg | ORAL_CAPSULE | Freq: Every day | ORAL | 2 refills | Status: DC

## 2019-05-05 MED ORDER — ALPRAZOLAM 0.5 MG PO TABS: 0.5000 mg | ORAL_TABLET | Freq: Two times a day (BID) | ORAL | 2 refills | Status: DC | PRN

## 2019-05-05 NOTE — Progress Notes (Signed)
Virtual Visit via Telephone Note  I connected with Kelsey Sandoval on 05/05/19 at  9:20 AM EDT by telephone and verified that I am speaking with the correct person using two identifiers.   I discussed the limitations, risks, security and privacy concerns of performing an evaluation and management service by telephone and the availability of in person appointments. I also discussed with the patient that there may be a patient responsible charge related to this service. The patient expressed understanding and agreed to proceed.      I discussed the assessment and treatment plan with the patient. The patient was provided an opportunity to ask questions and all were answered. The patient agreed with the plan and demonstrated an understanding of the instructions.   The patient was advised to call back or seek an in-person evaluation if the symptoms worsen or if the condition fails to improve as anticipated.  I provided 15 minutes of non-face-to-face time during this encounter.   Levonne Spiller, MD  [facility] MD/PA/NP OP Progress Note  05/05/2019 9:38 AM Kelsey Sandoval  MRN:  [id number]  Chief Complaint:  Chief Complaint    Depression; Anxiety; Follow-up     HPI: This patient is an 85 year old divorced white female who lives alone in [place].  She has 2 children and several grandchildren and great-grandchildren.  She is retired from a Research officer, trade [employer].  The patient returns after 3 months.  She states that she is doing well and she sounds distraught and tearful.  She states that her daughter is refusing to help her right now.  She states her grand daughter will not let her see her great-grandchildren.  She misses all these people terribly and feels lonely and isolated.  Her sister visits every day however and her son is still trying to help her as well.  She seems focused on all the negativity in her life.  The Xanax is helping her but she is not sure that the antidepressant is working anymore's.   She asked if she could try Paxil.  She is sleeping okay but is still having a lot of knee and back pain.  She is eating fairly well.  She denies suicidal ideation. Visit Diagnosis:    ICD-10-CM   1. Severe single current episode of major depressive disorder, without psychotic features ([place])  F32.2     Past Psychiatric History: none  Past Medical History:  Past Medical History:  Diagnosis Date  . Anxiety   . Aortic atherosclerosis ([place]) 10/10/2018  . Arthritis   . Back pain, chronic    Sciatica  . Cataracts, bilateral   . COPD (chronic obstructive pulmonary disease) ([place]) 10/10/2018  . Depression   . Dysrhythmia    AFib  . Essential hypertension   . GERD (gastroesophageal reflux disease)   . Hyperlipidemia   . Hypothyroidism   . Membranous nephropathy determined by biopsy 06/07/2015  . Nephrotic syndrome    RHUX  . Neuropathy   . Osteopenia   . Pacemaker    Medtronic  . PAF (paroxysmal atrial fibrillation) ([place])   . Pre-diabetes   . Tachycardia-bradycardia syndrome [facility])     Past Surgical History:  Procedure Laterality Date  . ABDOMINAL HYSTERECTOMY     partial-pt has no ovaries  . ANTERIOR VITRECTOMY Right 03/11/2018   Procedure: ANTERIOR VITRECTOMY;  Surgeon: Baruch Goldmann, MD;  Location: [facility] OR[facility];  Service: Ophthalmology;  Laterality: Right;  . APPENDECTOMY    . BACK SURGERY    . BIOPSY N/A 01/17/2014  Procedure: BIOPSY;  Surgeon: Rogene Houston, MD;  Location: [facility];  Service: Endoscopy;  Laterality: N/A;  . CATARACT EXTRACTION W/PHACO Left 02/28/2015   Procedure: CATARACT EXTRACTION PHACO AND INTRAOCULAR LENS PLACEMENT (IOC);  Surgeon: [name], MD;  Location: [facility];  Service: Ophthalmology;  Laterality: Left;  CDE 18.71  . CATARACT EXTRACTION W/PHACO Right 03/11/2018   Procedure: CATARACT EXTRACTION PHACO  AND INTRAOCULAR LENS PLACEMENT RIGHT EYE ;  Surgeon: Baruch Goldmann, MD;  Location: [facility];  Service: Ophthalmology;  Laterality: Right;  CDE: 19.39  .  COLONOSCOPY  9/05  . COLONOSCOPY N/A 10/11/2013   Procedure: COLONOSCOPY;  Surgeon: Rogene Houston, MD;  Location: [facility];  Service: Endoscopy;  Laterality: N/A;  1200  . ESOPHAGOGASTRODUODENOSCOPY N/A 01/17/2014   Procedure: ESOPHAGOGASTRODUODENOSCOPY (EGD);  Surgeon: Rogene Houston, MD;  Location: [facility];  Service: Endoscopy;  Laterality: N/A;  230  . ESOPHAGOGASTRODUODENOSCOPY N/A 01/02/2016   Procedure: ESOPHAGOGASTRODUODENOSCOPY (EGD);  Surgeon: Rogene Houston, MD;  Location: [facility];  Service: Endoscopy;  Laterality: N/A;  240  . INSERT / REPLACE / REMOVE PACEMAKER     2012  . Wisdom tooth extracton      Family Psychiatric History: see below  Family History:  Family History  Problem Relation Age of Onset  . Colon cancer Brother   . Anxiety disorder Sister   . Depression Sister   . Anxiety disorder Sister   . Depression Sister     Social History:  Social History   Socioeconomic History  . Marital status: Divorced    Spouse name: Not on file  . Number of children: Not on file  . Years of education: Not on file  . Highest education level: Not on file  Occupational History  . Occupation: retired  Scientific laboratory technician  . Financial resource strain: Not on file  . Food insecurity    Worry: Not on file    Inability: Not on file  . Transportation needs    Medical: Not on file    Non-medical: Not on file  Tobacco Use  . Smoking status: Never Smoker  . Smokeless tobacco: Never Used  Substance and Sexual Activity  . Alcohol use: No    Alcohol/week: 0.0 standard drinks  . Drug use: No  . Sexual activity: Not Currently    Birth control/protection: None  Lifestyle  . Physical activity    Days per week: Not on file    Minutes per session: Not on file  . Stress: Not on file  Relationships  . Social Herbalist on phone: Not on file    Gets together: Not on file    Attends religious service: Not on file    Active member of club or organization:  Not on file    Attends meetings of clubs or organizations: Not on file    Relationship status: Not on file  Other Topics Concern  . Not on file  Social History Narrative  . Not on file    Allergies:  Allergies  Allergen Reactions  . Penicillins Other (See Comments)    Caused patient to pass out.Can take cephalosporins Has patient had a PCN reaction causing immediate rash, facial/tongue/throat swelling, SOB or lightheadedness with hypotension: no Has patient had a PCN reaction causing severe rash involving mucus membranes or skin necrosis: No Has patient had a PCN reaction that required hospitalization No Has patient had a PCN reaction occurring within the last 10 years: No  If all of the above answers are "NO", then may proceed with Cephalosporin use.   . Levaquin [Levofloxacin] Nausea Only  . Sulfa Antibiotics Other (See Comments)    Unknown  . Zithromax [Azithromycin] Nausea Only and Rash    Metabolic Disorder Labs: No results found for: HGBA1C, MPG No results found for: PROLACTIN Lab Results  Component Value Date   CHOL 187 11/25/2015   TRIG 209 (H) 11/25/2015   HDL 58 11/25/2015   CHOLHDL 3.2 11/25/2015   VLDL 47 (H) 10/08/2014   LDLCALC 87 11/25/2015   LDLCALC 58 10/08/2014   Lab Results  Component Value Date   TSH 1.930 04/11/2019   TSH 3.140 02/16/2018    Therapeutic Level Labs: No results found for: LITHIUM No results found for: VALPROATE No components found for:  CBMZ  Current Medications: Current Outpatient Medications  Medication Sig Dispense Refill  . acetaminophen (TYLENOL) 650 MG CR tablet Take 1,300 mg by mouth every 8 (eight) hours as needed for pain.    Marland Kitchen ALPRAZolam (XANAX) 0.5 MG tablet Take 1 tablet (0.5 mg total) by mouth 2 (two) times daily as needed for anxiety. Take one qhs 60 tablet 2  . amLODipine (NORVASC) 5 MG tablet Take 1 tablet (5 mg total) by mouth daily. 90 tablet 3  . budesonide-formoterol (SYMBICORT) 80-4.5 MCG/ACT inhaler  Inhale 2 puffs into the lungs 2 (two) times daily. 1 Inhaler 12  . busPIRone (BUSPAR) 10 MG tablet Take 1 tablet (10 mg total) by mouth 3 (three) times daily. 270 tablet 2  . DULoxetine (CYMBALTA) 60 MG capsule Take 1 capsule (60 mg total) by mouth daily. 180 capsule 2  . gabapentin (NEURONTIN) 300 MG capsule TAKE 1 CAPSULE BY MOUTH THREE TIMES DAILY 90 capsule 5  . hydrALAZINE (APRESOLINE) 25 MG tablet TAKE 1 TABLET BY MOUTH EVERY 8 HOURS 90 tablet 5  . HYDROcodone-acetaminophen (NORCO) 10-325 MG tablet Take one tablet by mouth every 4 hours prn pain. Max 5/day 150 tablet 0  . hydrOXYzine (ATARAX/VISTARIL) 10 MG tablet One bid prn nausea, caution drowsiness (Patient taking differently: Take 10 mg by mouth 2 (two) times daily as needed for nausea or vomiting. ) 40 tablet 2  . levothyroxine (SYNTHROID) 50 MCG tablet TAKE 1 TABLET BY MOUTH ONCE DAILY BEFORE BREAKFAST 90 tablet 0  . metoprolol succinate (TOPROL-XL) 50 MG 24 hr tablet TAKE 1 TABLET BY MOUTH TWICE DAILY. TAKE WITH OR IMMEDIATELY FOLLOWING A MEAL. 180 tablet 1  . ondansetron (ZOFRAN ODT) 8 MG disintegrating tablet Take one tablet three times daily as needed for nausea 18 tablet 2  . PARoxetine (PAXIL) 20 MG tablet Take 1 tablet (20 mg total) by mouth daily. 30 tablet 2  . PRADAXA 75 MG CAPS capsule Take 1 capsule by mouth twice daily 60 capsule 6  . pravastatin (PRAVACHOL) 80 MG tablet Take 1 tablet (80 mg total) by mouth every evening. 90 tablet 0  . PROAIR HFA 108 (90 Base) MCG/ACT inhaler INHALE 2 PUFFS INTO LUNGS EVERY 6 HOURS AS NEEDED FOR WHEEZING OR SHORTNESS OF BREATH 9 g 6  . torsemide (DEMADEX) 20 MG tablet Take one and one half tablet by mouth each morning. 135 tablet 1  . triamcinolone cream (KENALOG) 0.1 % Apply 1 application topically 2 (two) times daily as needed. 60 g 2  . vitamin B-12 (CYANOCOBALAMIN) 1000 MCG tablet Take 1 tablet (1,000 mcg total) by mouth daily. (Patient taking differently: Take 1,000 mcg by mouth every  morning. )  No current facility-administered medications for this visit.      Musculoskeletal: Strength & Muscle Tone: decreased Gait & Station: unsteady Patient leans: N/A  Psychiatric Specialty Exam: Review of Systems  Musculoskeletal: Positive for back pain and joint pain.  Psychiatric/Behavioral: Positive for depression. The patient is nervous/anxious.   All other systems reviewed and are negative.   There were no vitals taken for this visit.There is no height or weight on file to calculate BMI.  General Appearance: NA  Eye Contact:  NA  Speech:  Clear and Coherent  Volume:  Normal  Mood:  Anxious and Depressed  Affect:  NA  Thought Process:  Goal Directed  Orientation:  Full (Time, Place, and Person)  Thought Content: Rumination   Suicidal Thoughts:  No  Homicidal Thoughts:  No  Memory:  Immediate;   Good Recent;   Good Remote;   Fair  Judgement:  Fair  Insight:  Fair  Psychomotor Activity:  Decreased  Concentration:  Concentration: Poor and Attention Span: Poor  Recall:  Good  Fund of Knowledge: Fair  Language: Good  Akathisia:  No  Handed:  Right  AIMS (if indicated): not done  Assets:  Communication Skills Desire for Improvement Physical Health Resilience Social Support  ADL's:  Intact  Cognition: WNL  Sleep:  Fair   Screenings: GAD-7     Office Visit from 08/29/2018 in [place]  Total GAD-7 Score  1    PHQ2-9     Office Visit from 08/29/2018 in [place] Office Visit from 05/17/2018 in [place] Office Visit from 11/18/2017 in [place] Office Visit from 09/20/2017 in [place] Office Visit from 01/23/2016 in [facility]  PHQ-2 Total Score  0  4  0  2  0  PHQ-9 Total Score  3  8  -  -  -       Assessment and Plan: This patient is an 86 year old female with a history of depression and anxiety.  She is feeling a lot more isolated because  of the coronavirus pandemic the inability of people to visit her and her chronic pain symptoms.  She feels that she is being slighted by certain family members and tends to focus on this.  Due to her anxiety we will continue Xanax 0.5 mg twice daily and BuSpar 10 mg 3 times daily.  We will discontinue mirtazapine at night and decrease Cymbalta to 60 mg daily.  She will add Paxil 20 mg daily also for depression.  She will return to see me in 4 weeks   Levonne Spiller, MD 05/05/2019, 9:39 AM

## 2019-05-11 ENCOUNTER — Ambulatory Visit (INDEPENDENT_AMBULATORY_CARE_PROVIDER_SITE_OTHER): Payer: PPO | Admitting: *Deleted

## 2019-05-11 DIAGNOSIS — I495 Sick sinus syndrome: Secondary | ICD-10-CM | POA: Diagnosis not present

## 2019-05-11 DIAGNOSIS — L570 Actinic keratosis: Secondary | ICD-10-CM | POA: Diagnosis not present

## 2019-05-11 DIAGNOSIS — L578 Other skin changes due to chronic exposure to nonionizing radiation: Secondary | ICD-10-CM | POA: Diagnosis not present

## 2019-05-12 ENCOUNTER — Telehealth: Payer: Self-pay

## 2019-05-12 LAB — CUP PACEART REMOTE DEVICE CHECK
Battery Impedance: 589 Ohm
Battery Remaining Longevity: 100 mo
Battery Voltage: 2.79 V
Brady Statistic RV Percent Paced: 11 %
Date Time Interrogation Session: 20200807130045
Implantable Lead Implant Date: 20120810
Implantable Lead Implant Date: 20120810
Implantable Lead Location: 753859
Implantable Lead Location: 753860
Implantable Lead Model: 5076
Implantable Lead Model: 5076
Implantable Pulse Generator Implant Date: 20120810
Lead Channel Impedance Value: 67 Ohm
Lead Channel Impedance Value: 763 Ohm
Lead Channel Pacing Threshold Amplitude: 1.125 V
Lead Channel Pacing Threshold Pulse Width: 0.4 ms
Lead Channel Setting Pacing Amplitude: 2.5 V
Lead Channel Setting Pacing Pulse Width: 0.4 ms
Lead Channel Setting Sensing Sensitivity: 5.6 mV

## 2019-05-12 NOTE — Telephone Encounter (Signed)
Left message for patient to remind of missed remote transmission.  

## 2019-05-15 ENCOUNTER — Telehealth: Payer: Self-pay | Admitting: Family Medicine

## 2019-05-15 NOTE — Telephone Encounter (Signed)
Daughter Kelsey Sandoval returned call. Informed Kelsey Sandoval that patient has one more script that can be filled at [facility]. Kelsey Sandoval states she will call pharmacy and get them to fill it

## 2019-05-15 NOTE — Telephone Encounter (Signed)
Pt last picked up medication on 04/14/2019 and does have one script left that can be filled. Left message to return call

## 2019-05-15 NOTE — Telephone Encounter (Signed)
Patient is requesting refill on pain medication hydrocodone 10/325. Please call [phone]

## 2019-05-16 ENCOUNTER — Encounter: Payer: Self-pay | Admitting: Cardiology

## 2019-05-16 NOTE — Progress Notes (Signed)
Remote pacemaker transmission.   

## 2019-06-01 ENCOUNTER — Telehealth: Payer: Self-pay | Admitting: Family Medicine

## 2019-06-01 NOTE — Telephone Encounter (Signed)
Please review form from her mail order pharmacy

## 2019-06-02 ENCOUNTER — Other Ambulatory Visit: Payer: Self-pay

## 2019-06-02 ENCOUNTER — Ambulatory Visit (INDEPENDENT_AMBULATORY_CARE_PROVIDER_SITE_OTHER): Payer: PPO | Admitting: Psychiatry

## 2019-06-02 ENCOUNTER — Encounter (HOSPITAL_COMMUNITY): Payer: Self-pay | Admitting: Psychiatry

## 2019-06-02 DIAGNOSIS — F322 Major depressive disorder, single episode, severe without psychotic features: Secondary | ICD-10-CM

## 2019-06-02 MED ORDER — DULOXETINE HCL 60 MG PO CPEP: 60.0000 mg | ORAL_CAPSULE | Freq: Every day | ORAL | 2 refills | Status: AC

## 2019-06-02 MED ORDER — ALPRAZOLAM 0.5 MG PO TABS: 0.5000 mg | ORAL_TABLET | Freq: Two times a day (BID) | ORAL | 2 refills | Status: DC | PRN

## 2019-06-02 MED ORDER — PAROXETINE HCL 20 MG PO TABS: 20.0000 mg | ORAL_TABLET | Freq: Every day | ORAL | 2 refills | Status: DC

## 2019-06-02 MED ORDER — BUSPIRONE HCL 10 MG PO TABS: 10.0000 mg | ORAL_TABLET | Freq: Three times a day (TID) | ORAL | 2 refills | Status: DC

## 2019-06-02 NOTE — Progress Notes (Signed)
Virtual Visit via Telephone Note  I connected with Kelsey Sandoval on 06/02/19 at  9:20 AM EDT by telephone and verified that I am speaking with the correct person using two identifiers.   I discussed the limitations, risks, security and privacy concerns of performing an evaluation and management service by telephone and the availability of in person appointments. I also discussed with the patient that there may be a patient responsible charge related to this service. The patient expressed understanding and agreed to proceed.     I discussed the assessment and treatment plan with the patient. The patient was provided an opportunity to ask questions and all were answered. The patient agreed with the plan and demonstrated an understanding of the instructions.   The patient was advised to call back or seek an in-person evaluation if the symptoms worsen or if the condition fails to improve as anticipated.  I provided 15 minutes of non-face-to-face time during this encounter.   Levonne Spiller, MD  [facility] MD/PA/NP OP Progress Note  06/02/2019 9:38 AM Kelsey Sandoval  MRN:  [id number]  Chief Complaint:  Chief Complaint    Depression; Anxiety; Follow-up     HPI: This patient is an 85 year old divorced white female who lives alone in [place].  She has 2 children and several grandchildren and great-grandchildren.  She is retired from a Research officer, trade union.  The patient returns after 4 weeks.  Last time she stated that she was not doing well and felt more depressed.  She felt neglected by her daughter and stated that her granddaughter was not allowing her to see the great-grandchildren even on virtual methods.  She was feeling alone and abandoned.  We did switch 1 of her Cymbalta dosages to Paxil and she seems to be doing better.  She also states that her daughter has come around more.  She is now talking to her granddaughter but for some reason the granddaughter still does not want her communicating with  the great-grandchildren although she will not explain why.  The patient states that she is excepting this for now letting it go.  She still has a good deal of knee and back pain and is on medication for this.  She only uses Xanax at night primarily and very rarely uses 1/2 tablet during the day.  She states that her mood is much better and she denies suicidal ideation. Visit Diagnosis:    ICD-10-CM   1. Severe single current episode of major depressive disorder, without psychotic features ([place])  F32.2     Past Psychiatric History: none  Past Medical History:  Past Medical History:  Diagnosis Date  . Anxiety   . Aortic atherosclerosis ([place]) 10/10/2018  . Arthritis   . Back pain, chronic    Sciatica  . Cataracts, bilateral   . COPD (chronic obstructive pulmonary disease) ([place]) 10/10/2018  . Depression   . Dysrhythmia    AFib  . Essential hypertension   . GERD (gastroesophageal reflux disease)   . Hyperlipidemia   . Hypothyroidism   . Membranous nephropathy determined by biopsy 06/07/2015  . Nephrotic syndrome    RHUX  . Neuropathy   . Osteopenia   . Pacemaker    Medtronic  . PAF (paroxysmal atrial fibrillation) ([place])   . Pre-diabetes   . Tachycardia-bradycardia syndrome [facility])     Past Surgical History:  Procedure Laterality Date  . ABDOMINAL HYSTERECTOMY     partial-pt has no ovaries  . ANTERIOR VITRECTOMY Right 03/11/2018   Procedure:  ANTERIOR VITRECTOMY;  Surgeon: Baruch Goldmann, MD;  Location: [facility] ORS;  Service: Ophthalmology;  Laterality: Right;  . APPENDECTOMY    . BACK SURGERY    . BIOPSY N/A 01/17/2014   Procedure: BIOPSY;  Surgeon: Rogene Houston, MD;  Location: [facility] END[facility] SU[facility];  Service: Endoscopy;  Laterality: N/A;  . CATARACT EXTRACTION W/PHACO Left 02/28/2015   Procedure: CATARACT EXTRACTION PHACO AND INTRAOCULAR LENS PLACEMENT (IOC);  Surgeon: [name], MD;  Location: [facility] ORS;  Service: Ophthalmology;  Laterality: Left;  CDE 18.71  . CATARACT EXTRACTION W/PHACO Right  03/11/2018   Procedure: CATARACT EXTRACTION PHACO  AND INTRAOCULAR LENS PLACEMENT RIGHT EYE ;  Surgeon: Baruch Goldmann, MD;  Location: [facility] ORS;  Service: Ophthalmology;  Laterality: Right;  CDE: 19.39  . COLONOSCOPY  9/05  . COLONOSCOPY N/A 10/11/2013   Procedure: COLONOSCOPY;  Surgeon: Rogene Houston, MD;  Location: [facility];  Service: Endoscopy;  Laterality: N/A;  1200  . ESOPHAGOGASTRODUODENOSCOPY N/A 01/17/2014   Procedure: ESOPHAGOGASTRODUODENOSCOPY (EGD);  Surgeon: Rogene Houston, MD;  [facility];  Service: Endoscopy;  Laterality: N/A;  230  . ESOPHAGOGASTRODUODENOSCOPY N/A 01/02/2016   Procedure: ESOPHAGOGASTRODUODENOSCOPY (EGD);  Surgeon: Rogene Houston, MD;  Location[facility];  Service: Endoscopy;  Laterality: N/A;  240  . INSERT / REPLACE / REMOVE PACEMAKER     2012  . Wisdom tooth extracton      Family Psychiatric History: See below  Family History:  Family History  Problem Relation Age of Onset  . Colon cancer Brother   . Anxiety disorder Sister   . Depression Sister   . Anxiety disorder Sister   . Depression Sister     Social History:  Social History   Socioeconomic History  . Marital status: Divorced    Spouse name: Not on file  . Number of children: Not on file  . Years of education: Not on file  . Highest education level: Not on file  Occupational History  . Occupation: retired  Scientific laboratory technician  . Financial resource strain: Not on file  . Food insecurity    Worry: Not on file    Inability: Not on file  . Transportation needs    Medical: Not on file    Non-medical: Not on file  Tobacco Use  . Smoking status: Never Smoker  . Smokeless tobacco: Never Used  Substance and Sexual Activity  . Alcohol use: No    Alcohol/week: 0.0 standard drinks  . Drug use: No  . Sexual activity: Not Currently    Birth control/protection: None  Lifestyle  . Physical activity    Days per week: Not on file    Minutes per session: Not on file  . Stress:  Not on file  Relationships  . Social Herbalist on phone: Not on file    Gets together: Not on file    Attends religious service: Not on file    Active member of club or organization: Not on file    Attends meetings of clubs or organizations: Not on file    Relationship status: Not on file  Other Topics Concern  . Not on file  Social History Narrative  . Not on file    Allergies:  Allergies  Allergen Reactions  . Penicillins Other (See Comments)    Caused patient to pass out.Can take cephalosporins Has patient had a PCN reaction causing immediate rash, facial/tongue/throat swelling, SOB or lightheadedness with hypotension: no Has patient had a  PCN reaction causing severe rash involving mucus membranes or skin necrosis: No Has patient had a PCN reaction that required hospitalization No Has patient had a PCN reaction occurring within the last 10 years: No If all of the above answers are "NO", then may proceed with Cephalosporin use.   . Levaquin [Levofloxacin] Nausea Only  . Sulfa Antibiotics Other (See Comments)    Unknown  . Zithromax [Azithromycin] Nausea Only and Rash    Metabolic Disorder Labs: No results found for: HGBA1C, MPG No results found for: PROLACTIN Lab Results  Component Value Date   CHOL 187 11/25/2015   TRIG 209 (H) 11/25/2015   HDL 58 11/25/2015   CHOLHDL 3.2 11/25/2015   VLDL 47 (H) 10/08/2014   LDLCALC 87 11/25/2015   LDLCALC 58 10/08/2014   Lab Results  Component Value Date   TSH 1.930 04/11/2019   TSH 3.140 02/16/2018    Therapeutic Level Labs: No results found for: LITHIUM No results found for: VALPROATE No components found for:  CBMZ  Current Medications: Current Outpatient Medications  Medication Sig Dispense Refill  . acetaminophen (TYLENOL) 650 MG CR tablet Take 1,300 mg by mouth every 8 (eight) hours as needed for pain.    Marland Kitchen ALPRAZolam (XANAX) 0.5 MG tablet Take 1 tablet (0.5 mg total) by mouth 2 (two) times daily as  needed for anxiety. Take one qhs 60 tablet 2  . amLODipine (NORVASC) 5 MG tablet Take 1 tablet (5 mg total) by mouth daily. 90 tablet 3  . budesonide-formoterol (SYMBICORT) 80-4.5 MCG/ACT inhaler Inhale 2 puffs into the lungs 2 (two) times daily. 1 Inhaler 12  . busPIRone (BUSPAR) 10 MG tablet Take 1 tablet (10 mg total) by mouth 3 (three) times daily. 270 tablet 2  . DULoxetine (CYMBALTA) 60 MG capsule Take 1 capsule (60 mg total) by mouth daily. 180 capsule 2  . gabapentin (NEURONTIN) 300 MG capsule TAKE 1 CAPSULE BY MOUTH THREE TIMES DAILY 90 capsule 5  . hydrALAZINE (APRESOLINE) 25 MG tablet TAKE 1 TABLET BY MOUTH EVERY 8 HOURS 90 tablet 5  . HYDROcodone-acetaminophen (NORCO) 10-325 MG tablet Take one tablet by mouth every 4 hours prn pain. Max 5/day 150 tablet 0  . hydrOXYzine (ATARAX/VISTARIL) 10 MG tablet One bid prn nausea, caution drowsiness (Patient taking differently: Take 10 mg by mouth 2 (two) times daily as needed for nausea or vomiting. ) 40 tablet 2  . levothyroxine (SYNTHROID) 50 MCG tablet TAKE 1 TABLET BY MOUTH ONCE DAILY BEFORE BREAKFAST 90 tablet 0  . metoprolol succinate (TOPROL-XL) 50 MG 24 hr tablet TAKE 1 TABLET BY MOUTH TWICE DAILY. TAKE WITH OR IMMEDIATELY FOLLOWING A MEAL. 180 tablet 1  . ondansetron (ZOFRAN ODT) 8 MG disintegrating tablet Take one tablet three times daily as needed for nausea 18 tablet 2  . PARoxetine (PAXIL) 20 MG tablet Take 1 tablet (20 mg total) by mouth daily. 30 tablet 2  . PRADAXA 75 MG CAPS capsule Take 1 capsule by mouth twice daily 60 capsule 6  . pravastatin (PRAVACHOL) 80 MG tablet Take 1 tablet (80 mg total) by mouth every evening. 90 tablet 0  . PROAIR HFA 108 (90 Base) MCG/ACT inhaler INHALE 2 PUFFS INTO LUNGS EVERY 6 HOURS AS NEEDED FOR WHEEZING OR SHORTNESS OF BREATH 9 g 6  . torsemide (DEMADEX) 20 MG tablet Take one and one half tablet by mouth each morning. 135 tablet 1  . triamcinolone cream (KENALOG) 0.1 % Apply 1 application  topically 2 (two) times daily  as needed. 60 g 2  . vitamin B-12 (CYANOCOBALAMIN) 1000 MCG tablet Take 1 tablet (1,000 mcg total) by mouth daily. (Patient taking differently: Take 1,000 mcg by mouth every morning. )     No current facility-administered medications for this visit.      Musculoskeletal: Strength & Muscle Tone: decreased Gait & Station: unsteady Patient leans: N/A  Psychiatric Specialty Exam: Review of Systems  Respiratory: Positive for shortness of breath.   Musculoskeletal: Positive for back pain and joint pain.  Neurological: Positive for weakness.  All other systems reviewed and are negative.   There were no vitals taken for this visit.There is no height or weight on file to calculate BMI.  General Appearance: NA  Eye Contact:  NA  Speech:  Clear and Coherent  Volume:  Normal  Mood:  Euthymic  Affect:  NA  Thought Process:  Goal Directed  Orientation:  Full (Time, Place, and Person)  Thought Content: WDL   Suicidal Thoughts:  No  Homicidal Thoughts:  No  Memory:  Immediate;   Good Recent;   Good Remote;   Good  Judgement:  Good  Insight:  Fair  Psychomotor Activity:  Decreased  Concentration:  Concentration: Fair and Attention Span: Fair  Recall:  Good  Fund of Knowledge: Fair  Language: Good  Akathisia:  No  Handed:  Right  AIMS (if indicated): not done  Assets:  Communication Skills Desire for Improvement Resilience Social Support  ADL's:  Intact  Cognition: WNL  Sleep:  Fair   Screenings: GAD-7     Office Visit from 08/29/2018 in [place]  Total GAD-7 Score  1    PHQ2-9     Office Visit from 08/29/2018 in [place] Office Visit from 05/17/2018 in [place] Office Visit from 11/18/2017 in [place] Office Visit from 09/20/2017 in [place] Office Visit from 01/23/2016 in [facility]  PHQ-2 Total Score  0  4  0  2  0  PHQ-9 Total  Score  3  8  -  -  -       Assessment and Plan: This patient is an 85 year old female with a history of depression and anxiety.  She seems to be doing better possibly because of medication and also because her family has become more attentive again.  She will continue Xanax 0.5 mg at bedtime and 0.25 mg during the day as needed for anxiety, BuSpar 10 mg 3 times daily for anxiety, Cymbalta 60 mg daily as well as Paxil 20 mg at bedtime for depression.  She will return to see me in 3 months   Levonne Spiller, MD 06/02/2019, 9:38 AM

## 2019-06-05 ENCOUNTER — Ambulatory Visit (INDEPENDENT_AMBULATORY_CARE_PROVIDER_SITE_OTHER): Payer: PPO | Admitting: Cardiovascular Disease

## 2019-06-05 ENCOUNTER — Other Ambulatory Visit: Payer: Self-pay

## 2019-06-05 ENCOUNTER — Encounter: Payer: Self-pay | Admitting: Cardiovascular Disease

## 2019-06-05 VITALS — BP 123/74 | HR 72 | Temp 96.0°F | Ht 68.0 in | Wt 157.0 lb

## 2019-06-05 DIAGNOSIS — N183 Chronic kidney disease, stage 3 unspecified: Secondary | ICD-10-CM

## 2019-06-05 DIAGNOSIS — Z950 Presence of cardiac pacemaker: Secondary | ICD-10-CM | POA: Diagnosis not present

## 2019-06-05 DIAGNOSIS — I5032 Chronic diastolic (congestive) heart failure: Secondary | ICD-10-CM

## 2019-06-05 DIAGNOSIS — I10 Essential (primary) hypertension: Secondary | ICD-10-CM | POA: Diagnosis not present

## 2019-06-05 DIAGNOSIS — R0609 Other forms of dyspnea: Secondary | ICD-10-CM

## 2019-06-05 DIAGNOSIS — I4821 Permanent atrial fibrillation: Secondary | ICD-10-CM | POA: Diagnosis not present

## 2019-06-05 DIAGNOSIS — I495 Sick sinus syndrome: Secondary | ICD-10-CM

## 2019-06-05 NOTE — Patient Instructions (Signed)
Medication Instructions: Your physician recommends that you continue on your current medications as directed. Please refer to the Current Medication list given to you today.   Labwork: none  Procedures/Testing: Your physician has requested that you have a lexiscan myoview. For further information please visit [email or web address]. Please follow instruction sheet, as given.    Follow-Up: 6 months with Bernerd Pho, PA-C  Any Additional Special Instructions Will Be Listed Below (If Applicable).     If you need a refill on your cardiac medications before your next appointment, please call your pharmacy.

## 2019-06-05 NOTE — Progress Notes (Addendum)
SUBJECTIVE: Kelsey Sandoval presents for routine follow-up.  Past medical history includes permanentatrial fibrillation (on Pradaxa for anticoagulation),chronic diastolic CHF,tachy-brady syndrome (s/p Medtronic PPM placement),mitral regurgitation,HTN, HLD, and stage III chronic kidney disease.  She is bothered by right leg sciatica.  She walks with a walker.  She denies chest pain but continues to complain of exertional dyspnea and fatigue.  She also has significant bilateral hip pain.  She is accompanied by a friend.   Review of Systems: As per "subjective", otherwise negative.  Allergies  Allergen Reactions  . Penicillins Other (See Comments)    Caused patient to pass out.Can take cephalosporins Has patient had a PCN reaction causing immediate rash, facial/tongue/throat swelling, SOB or lightheadedness with hypotension: no Has patient had a PCN reaction causing severe rash involving mucus membranes or skin necrosis: No Has patient had a PCN reaction that required hospitalization No Has patient had a PCN reaction occurring within the last 10 years: No If all of the above answers are "NO", then may proceed with Cephalosporin use.   . Levaquin [Levofloxacin] Nausea Only  . Sulfa Antibiotics Other (See Comments)    Unknown  . Zithromax [Azithromycin] Nausea Only and Rash    Current Outpatient Medications  Medication Sig Dispense Refill  . ALPRAZolam (XANAX) 0.5 MG tablet Take 1 tablet (0.5 mg total) by mouth 2 (two) times daily as needed for anxiety. Take one qhs 60 tablet 2  . amLODipine (NORVASC) 5 MG tablet Take 1 tablet (5 mg total) by mouth daily. 90 tablet 3  . budesonide-formoterol (SYMBICORT) 80-4.5 MCG/ACT inhaler Inhale 2 puffs into the lungs 2 (two) times daily. 1 Inhaler 12  . busPIRone (BUSPAR) 10 MG tablet Take 1 tablet (10 mg total) by mouth 3 (three) times daily. 270 tablet 2  . DULoxetine (CYMBALTA) 60 MG capsule Take 1 capsule (60 mg total) by mouth daily. 180  capsule 2  . gabapentin (NEURONTIN) 300 MG capsule TAKE 1 CAPSULE BY MOUTH THREE TIMES DAILY 90 capsule 5  . hydrALAZINE (APRESOLINE) 25 MG tablet TAKE 1 TABLET BY MOUTH EVERY 8 HOURS 90 tablet 5  . HYDROcodone-acetaminophen (NORCO) 10-325 MG tablet Take one tablet by mouth every 4 hours prn pain. Max 5/day 150 tablet 0  . hydrOXYzine (ATARAX/VISTARIL) 10 MG tablet One bid prn nausea, caution drowsiness (Patient taking differently: Take 10 mg by mouth 2 (two) times daily as needed for nausea or vomiting. ) 40 tablet 2  . levothyroxine (SYNTHROID) 50 MCG tablet TAKE 1 TABLET BY MOUTH ONCE DAILY BEFORE BREAKFAST 90 tablet 0  . metoprolol succinate (TOPROL-XL) 50 MG 24 hr tablet TAKE 1 TABLET BY MOUTH TWICE DAILY. TAKE WITH OR IMMEDIATELY FOLLOWING A MEAL. 180 tablet 1  . ondansetron (ZOFRAN ODT) 8 MG disintegrating tablet Take one tablet three times daily as needed for nausea 18 tablet 2  . PARoxetine (PAXIL) 20 MG tablet Take 1 tablet (20 mg total) by mouth daily. 30 tablet 2  . PRADAXA 75 MG CAPS capsule Take 1 capsule by mouth twice daily 60 capsule 6  . pravastatin (PRAVACHOL) 80 MG tablet Take 1 tablet (80 mg total) by mouth every evening. 90 tablet 0  . PROAIR HFA 108 (90 Base) MCG/ACT inhaler INHALE 2 PUFFS INTO LUNGS EVERY 6 HOURS AS NEEDED FOR WHEEZING OR SHORTNESS OF BREATH 9 g 6  . torsemide (DEMADEX) 20 MG tablet Take one and one half tablet by mouth each morning. 135 tablet 1  . triamcinolone cream (KENALOG) 0.1 %  Apply 1 application topically 2 (two) times daily as needed. 60 g 2  . vitamin B-12 (CYANOCOBALAMIN) 1000 MCG tablet Take 1 tablet (1,000 mcg total) by mouth daily. (Patient taking differently: Take 1,000 mcg by mouth every morning. )     No current facility-administered medications for this visit.     Past Medical History:  Diagnosis Date  . Anxiety   . Aortic atherosclerosis ([place]) 10/10/2018  . Arthritis   . Back pain, chronic    Sciatica  . Cataracts, bilateral   .  COPD (chronic obstructive pulmonary disease) ([place]) 10/10/2018  . Depression   . Dysrhythmia    AFib  . Essential hypertension   . GERD (gastroesophageal reflux disease)   . Hyperlipidemia   . Hypothyroidism   . Membranous nephropathy determined by biopsy 06/07/2015  . Nephrotic syndrome    RHUX  . Neuropathy   . Osteopenia   . Pacemaker    Medtronic  . PAF (paroxysmal atrial fibrillation) ([place])   . Pre-diabetes   . Tachycardia-bradycardia syndrome [facility])     Past Surgical History:  Procedure Laterality Date  . ABDOMINAL HYSTERECTOMY     partial-pt has no ovaries  . ANTERIOR VITRECTOMY Right 03/11/2018   Procedure: ANTERIOR VITRECTOMY;  Surgeon: Baruch Goldmann, MD;  Location: [facility];  Service: Ophthalmology;  Laterality: Right;  . APPENDECTOMY    . BACK SURGERY    . BIOPSY N/A 01/17/2014   Procedure: BIOPSY;  Surgeon: Rogene Houston, MD;  Location[facility];  Service: Endoscopy;  Laterality: N/A;  . CATARACT EXTRACTION W/PHACO Left 02/28/2015   Procedure: CATARACT EXTRACTION PHACO AND INTRAOCULAR LENS PLACEMENT (IOC);  Surgeon: [name], MD;  Location: [facility] ORS;  Service: Ophthalmology;  Laterality: Left;  CDE 18.71  . CATARACT EXTRACTION W/PHACO Right 03/11/2018   Procedure: CATARACT EXTRACTION PHACO  AND INTRAOCULAR LENS PLACEMENT RIGHT EYE ;  Surgeon: Baruch Goldmann, MD;  Location: [facility];  Service: Ophthalmology;  Laterality: Right;  CDE: 19.39  . COLONOSCOPY  9/05  . COLONOSCOPY N/A 10/11/2013   Procedure: COLONOSCOPY;  Surgeon: Rogene Houston, MD;  [facility];  Service: Endoscopy;  Laterality: N/A;  1200  . ESOPHAGOGASTRODUODENOSCOPY N/A 01/17/2014   Procedure: ESOPHAGOGASTRODUODENOSCOPY (EGD);  Surgeon: Rogene Houston, MD;  [facility];  Service: Endoscopy;  Laterality: N/A;  230  . ESOPHAGOGASTRODUODENOSCOPY N/A 01/02/2016   Procedure: ESOPHAGOGASTRODUODENOSCOPY (EGD);  Surgeon: Rogene Houston, MD;  [facility];  Service: Endoscopy;   Laterality: N/A;  240  . INSERT / REPLACE / REMOVE PACEMAKER     2012  . Wisdom tooth extracton      Social History   Socioeconomic History  . Marital status: Divorced    Spouse name: Not on file  . Number of children: Not on file  . Years of education: Not on file  . Highest education level: Not on file  Occupational History  . Occupation: retired  Scientific laboratory technician  . Financial resource strain: Not on file  . Food insecurity    Worry: Not on file    Inability: Not on file  . Transportation needs    Medical: Not on file    Non-medical: Not on file  Tobacco Use  . Smoking status: Never Smoker  . Smokeless tobacco: Never Used  Substance and Sexual Activity  . Alcohol use: No    Alcohol/week: 0.0 standard drinks  . Drug use: No  . Sexual activity: Not Currently    Birth control/protection:  None  Lifestyle  . Physical activity    Days per week: Not on file    Minutes per session: Not on file  . Stress: Not on file  Relationships  . Social Herbalist on phone: Not on file    Gets together: Not on file    Attends religious service: Not on file    Active member of club or organization: Not on file    Attends meetings of clubs or organizations: Not on file    Relationship status: Not on file  . Intimate partner violence    Fear of current or ex partner: Not on file    Emotionally abused: Not on file    Physically abused: Not on file    Forced sexual activity: Not on file  Other Topics Concern  . Not on file  Social History Narrative  . Not on file     Vitals:   06/05/19 1057  BP: 123/74  Pulse: 72  Temp: (!) 96 F (35.6 C)  SpO2: 98%  Height: 5\' 8"  (1.727 m)    Wt Readings from Last 3 Encounters:  02/07/19 154 lb (69.9 kg)  12/14/18 173 lb (78.5 kg)  12/12/18 173 lb (78.5 kg)     PHYSICAL EXAM General: NAD HEENT: Normal. Neck: No JVD, no thyromegaly. Lungs: Clear to auscultation bilaterally with normal respiratory effort. CV: Regular rate  and irregular rhythm, normal S1/S2, no S3, no murmur. No pretibial or periankle edema.  Abdomen: Soft, nontender, no distention.  Neurologic: Alert and oriented.  Psych: Normal affect. Skin: Normal. Musculoskeletal: No gross deformities.    ECG: Reviewed above under Subjective   Labs: Lab Results  Component Value Date/Time   K 5.4 (H) 04/11/2019 10:39 AM   BUN 25 04/11/2019 10:39 AM   CREATININE 1.34 (H) 04/11/2019 10:39 AM   CREATININE 1.18 (H) 10/13/2016 10:04 AM   ALT 10 04/11/2019 10:39 AM   TSH 1.930 04/11/2019 10:39 AM   HGB 13.9 04/11/2019 10:39 AM     Lipids: Lab Results  Component Value Date/Time   LDLCALC 87 11/25/2015 09:55 AM   CHOL 187 11/25/2015 09:55 AM   TRIG 209 (H) 11/25/2015 09:55 AM   HDL 58 11/25/2015 09:55 AM       ASSESSMENT AND PLAN: 1.  Chronic diastolic heart failure: Symptomatically stable.  Continue torsemide 30 mg daily.  2.  Permanent atrial fibrillation: Continue Toprol-XL 50 mg twice daily.  Continue Pradaxa for systemic anticoagulation.  3.  Tachycardia-bradycardia syndrome: Status post Medtronic permanent pacemaker.  Followed by Dr. Lovena Le.  4.  Hypertension: Controlled.  No changes.  5.  Stage III chronic kidney disease: Followed by nephrology.  Creatinine 1.34 on 04/11/2019.  6.  Exertional dyspnea and fatigue: I will proceed with a nuclear myocardial perfusion imaging study to evaluate for ischemic heart disease (Lexiscan Myoview).   Disposition: Follow up 6 months   Kate Sable, M.D., F.A.C.C.

## 2019-06-08 NOTE — Telephone Encounter (Signed)
Was reviewed

## 2019-06-13 ENCOUNTER — Other Ambulatory Visit: Payer: Self-pay

## 2019-06-13 ENCOUNTER — Encounter (HOSPITAL_BASED_OUTPATIENT_CLINIC_OR_DEPARTMENT_OTHER)
Admission: RE | Admit: 2019-06-13 | Discharge: 2019-06-13 | Disposition: A | Discharge status: Home / Self Care | Payer: PPO | Source: Ambulatory Visit | Attending: Cardiovascular Disease | Admitting: Cardiovascular Disease

## 2019-06-13 ENCOUNTER — Encounter (HOSPITAL_COMMUNITY)
Admission: RE | Admit: 2019-06-13 | Discharge: 2019-06-13 | Disposition: A | Discharge status: Home / Self Care | Payer: PPO | Source: Ambulatory Visit | Attending: Cardiovascular Disease | Admitting: Cardiovascular Disease

## 2019-06-13 DIAGNOSIS — R0609 Other forms of dyspnea: Secondary | ICD-10-CM

## 2019-06-13 LAB — NM MYOCAR MULTI W/SPECT W/WALL MOTION / EF
LV dias vol: 68 mL (ref 46–106)
LV sys vol: 18 mL
Peak HR: 88 {beats}/min
RATE: 0.37
Rest HR: 69 {beats}/min
SDS: 1
SRS: 0
SSS: 1
TID: 1.16

## 2019-06-13 MED ORDER — TECHNETIUM TC 99M TETROFOSMIN IV KIT
10.0000 | PACK | Freq: Once | INTRAVENOUS | Status: AC | PRN
  Administered 2019-06-13: 10:00:00 9.72 via INTRAVENOUS

## 2019-06-13 MED ORDER — REGADENOSON 0.4 MG/5ML IV SOLN
INTRAVENOUS | Status: AC
  Administered 2019-06-13: 0.4 mg via INTRAVENOUS
  Filled 2019-06-13: qty 5

## 2019-06-13 MED ORDER — SODIUM CHLORIDE 0.9% FLUSH
INTRAVENOUS | Status: AC
  Administered 2019-06-13: 12:00:00 10 mL via INTRAVENOUS
  Filled 2019-06-13: qty 10

## 2019-06-13 MED ORDER — TECHNETIUM TC 99M TETROFOSMIN IV KIT
30.0000 | PACK | Freq: Once | INTRAVENOUS | Status: AC | PRN
  Administered 2019-06-13: 30 via INTRAVENOUS

## 2019-06-20 ENCOUNTER — Other Ambulatory Visit: Payer: Self-pay | Admitting: Family Medicine

## 2019-06-22 ENCOUNTER — Telehealth: Payer: Self-pay | Admitting: Family Medicine

## 2019-06-22 ENCOUNTER — Other Ambulatory Visit: Payer: Self-pay | Admitting: Family Medicine

## 2019-06-22 MED ORDER — HYDROCODONE-ACETAMINOPHEN 10-325 MG PO TABS: ORAL_TABLET | ORAL | 0 refills | Status: DC

## 2019-06-22 NOTE — Telephone Encounter (Signed)
Medication sent in as requested Patient may do virtual or in person follow-up in the first couple weeks of October

## 2019-06-22 NOTE — Telephone Encounter (Signed)
Discussed with pt's daughter. She states she will call back to make appt because she is on the way to taking her husband to ed. She wanted to let dr scott know taht she told her mom that dr Nicki Reaper cut her back to one qid even though rx was wrote for one up to 5 times a day and she had been doing well on that but she fell over a hump in the carpet she was using her walker and she got some bruises but she is ok but she was complaining of pain so she has been giving up to 5 per day since she is sore. States it is nothing she needs to be seen. Just wanted to let dr scott know. States she has been doing well.

## 2019-06-22 NOTE — Telephone Encounter (Signed)
Patient is requesting refill on hydrocodone 10/325 she will be out on 9/22. [facility] [place]

## 2019-07-18 ENCOUNTER — Telehealth: Payer: Self-pay | Admitting: Cardiovascular Disease

## 2019-07-18 NOTE — Telephone Encounter (Signed)
Pt is having hip injection and is needing to hold her medication

## 2019-07-18 NOTE — Telephone Encounter (Signed)
Letter to hold Pradaxa for 5 day placed on Dr. Lovena Le desk for review. Pt notified and voiced understanding.

## 2019-07-19 ENCOUNTER — Other Ambulatory Visit: Payer: Self-pay | Admitting: *Deleted

## 2019-07-19 ENCOUNTER — Telehealth: Payer: Self-pay | Admitting: Family Medicine

## 2019-07-19 MED ORDER — HYDROXYZINE HCL 25 MG PO TABS: ORAL_TABLET | ORAL | 2 refills | Status: DC

## 2019-07-19 NOTE — Telephone Encounter (Signed)
Please advise. Thank you

## 2019-07-19 NOTE — Telephone Encounter (Signed)
1.  It would be beneficial to know what dermatologist she saw so we could get a copy of that record #2 she may use hydroxyzine as needed to help with the itching She already has some prescribed previously if she needs more sent and please let us know  #3 she should take a generic Zyrtec-certrazine 10 mg 1 every single day #4 lotion skin on a regular basis #5 avoid hot showers hot baths Limit how long she takes her bath/shower Follow-up office visit recommended if ongoing

## 2019-07-19 NOTE — Telephone Encounter (Signed)
Patient said she is still itching all over.  Has seen a dermatologist about this and has tried creams etc. Hasn't tried Benedryl yet. Said she doesn't really have a rash but gets red where she scratches.  Her head even itches.  What can she do?  (No available appts this week with Dr. Nicki Reaper)

## 2019-07-19 NOTE — Telephone Encounter (Signed)
I would try a stronger dose of hydroxyzine 25 mg 1 every 6 hours as needed itching, #30, 2 refills, cautioned drowsiness Also the generic of Zyrtec daily Follow-up with dermatology in [facility] can call them to see if they would be willing to see her sooner Lotion to skin daily

## 2019-07-19 NOTE — Telephone Encounter (Signed)
Pt states that she is not sure of what dermatologist she seen but they are in [place]. Pt has treatments from dermatologist (creams and stuff). Pt states hydroxyzine did not help. Pt states when she scratches it feels like a little "ball" under her skin. Pt states her head itches terrible and she cant get her hair cut. Pt does have a follow up appt with dermatologist in November. Please advise. Thank you. Pt states she will try generic Zyrtec.   [facility] [place]

## 2019-07-20 NOTE — Telephone Encounter (Signed)
Discussed with pt and med sent to pharm.  

## 2019-07-24 ENCOUNTER — Other Ambulatory Visit: Payer: Self-pay | Admitting: Family Medicine

## 2019-07-24 ENCOUNTER — Telehealth: Payer: Self-pay | Admitting: Family Medicine

## 2019-07-24 MED ORDER — HYDROCODONE-ACETAMINOPHEN 10-325 MG PO TABS: ORAL_TABLET | ORAL | 0 refills | Status: DC

## 2019-07-24 NOTE — Telephone Encounter (Signed)
Please talk with daughter  Find out how much pain medicine she is currently using We can send in the prescription then she can schedule accordingly This week I am already essentially booked up for chronic health issues that Therefore we are targeting getting her in within the next couple weeks

## 2019-07-24 NOTE — Telephone Encounter (Signed)
Prescription for 150 tablets was sent in as requested will do a follow-up pain visit as scheduled coming

## 2019-07-24 NOTE — Telephone Encounter (Signed)
Discussed with pt's daughter and she states her mom is doing really well with the med and she takes 4 a day and she will run out Saturday. Transferred to the front to schedule a virtual visit for pain management in the next couple of weeks but not this week. Uses [facility] in [place]

## 2019-07-24 NOTE — Telephone Encounter (Signed)
Daughter is requesting appointment for pain management for patient. Patient will be out of medication on 9/25 and needing appointment this week if possible. Please advise

## 2019-07-26 DIAGNOSIS — I10 Essential (primary) hypertension: Secondary | ICD-10-CM | POA: Diagnosis not present

## 2019-07-26 DIAGNOSIS — M5416 Radiculopathy, lumbar region: Secondary | ICD-10-CM | POA: Diagnosis not present

## 2019-07-27 ENCOUNTER — Encounter: Payer: PPO | Admitting: Internal Medicine

## 2019-07-28 ENCOUNTER — Other Ambulatory Visit: Payer: Self-pay

## 2019-07-29 ENCOUNTER — Other Ambulatory Visit (INDEPENDENT_AMBULATORY_CARE_PROVIDER_SITE_OTHER): Payer: PPO | Admitting: *Deleted

## 2019-07-29 DIAGNOSIS — Z23 Encounter for immunization: Secondary | ICD-10-CM

## 2019-08-03 ENCOUNTER — Ambulatory Visit (INDEPENDENT_AMBULATORY_CARE_PROVIDER_SITE_OTHER): Payer: PPO | Admitting: Family Medicine

## 2019-08-03 DIAGNOSIS — M170 Bilateral primary osteoarthritis of knee: Secondary | ICD-10-CM | POA: Diagnosis not present

## 2019-08-03 DIAGNOSIS — E038 Other specified hypothyroidism: Secondary | ICD-10-CM

## 2019-08-03 DIAGNOSIS — J439 Emphysema, unspecified: Secondary | ICD-10-CM

## 2019-08-03 MED ORDER — HYDROCODONE-ACETAMINOPHEN 10-325 MG PO TABS: ORAL_TABLET | ORAL | 0 refills | Status: DC

## 2019-08-03 NOTE — Progress Notes (Signed)
Subjective:    Patient ID: Kelsey Sandoval, female    DOB: 1932/12/22, 85 y.o.   MRN: [id number]  HPI This patient was seen today for chronic pain  The medication list was reviewed and updated.   -Compliance with medication: yes  - Number patient states they take daily: mostly 3 -4 a day and 5 a day when she had that fall  -when was the last dose patient took? This morning   The patient was advised the importance of maintaining medication and not using illegal substances with these.  Here for refills and follow up  The patient was educated that we can provide 3 monthly scripts for their medication, it is their responsibility to follow the instructions.  Side effects or complications from medications: none  Patient is aware that pain medications are meant to minimize the severity of the pain to allow their pain levels to improve to allow for better function. They are aware of that pain medications cannot totally remove their pain.  Due for UDT ( at least once per year) : last one 11/18/17. Unable to do today since doing a virtual visit.   Left knee cramping and hurting. Has fallen twice in the last 2 months per pt's daughter. Daughter [name] states she is scared to tell people she is falling because her son told her if you tell doctor you are falling then someone will have to come in and stay with you. Daughter thinks it is the walker causing the falls.   Suppose to use her daily inhaler for copd but she only takes it when she feels she needs it because of a side effect of it.  Patient denies any severe shortness of breath.  States she takes her medicine intermittently.  Cardiac stable  Hypothyroidism takes her medicine on a regular basis denies any major issues there Virtual Visit via Telephone Note  I connected with Kelsey Sandoval on 08/03/19 at 10:00 AM EDT by telephone and verified that I am speaking with the correct person using two identifiers.  Location: Patient: home  Provider: office   I discussed the limitations, risks, security and privacy concerns of performing an evaluation and management service by telephone and the availability of in person appointments. I also discussed with the patient that there may be a patient responsible charge related to this service. The patient expressed understanding and agreed to proceed.   History of Present Illness:    Observations/Objective:   Assessment and Plan:   Follow Up Instructions:    I discussed the assessment and treatment plan with the patient. The patient was provided an opportunity to ask questions and all were answered. The patient agreed with the plan and demonstrated an understanding of the instructions.   The patient was advised to call back or seek an in-person evaluation if the symptoms worsen or if the condition fails to improve as anticipated.  I provided 20 minutes of non-face-to-face time during this encounter.         Review of Systems  Constitutional: Negative for activity change, appetite change and fatigue.  HENT: Negative for congestion and rhinorrhea.   Respiratory: Negative for cough and shortness of breath.   Cardiovascular: Negative for chest pain and leg swelling.  Gastrointestinal: Negative for abdominal pain and diarrhea.  Endocrine: Negative for polydipsia and polyphagia.  Musculoskeletal: Positive for arthralgias and back pain.  Skin: Negative for color change.  Neurological: Negative for dizziness and weakness.  Psychiatric/Behavioral: Negative for behavioral problems and  confusion.       Objective:   Physical Exam Today's visit was via telephone Physical exam was not possible for this visit        Assessment & Plan:  1. Pulmonary emphysema, unspecified emphysema type ([place]) She will use her inhaler more often not having any acute deficiencies currently no need for further testing currently  2. Other specified hypothyroidism Takes her thyroid  medicine does well with this labs earlier this year look good no need to repeat labs currently follow-up by spring  3. Primary osteoarthritis of both knees This patient does have chronic pain she uses her pain medicine for 5/day family tries to do a good job making sure that she does not overuse the medication  Follow-up again in 3 months virtual visit

## 2019-08-10 ENCOUNTER — Telehealth: Payer: Self-pay | Admitting: Cardiovascular Disease

## 2019-08-10 ENCOUNTER — Telehealth: Payer: Self-pay | Admitting: Family Medicine

## 2019-08-10 ENCOUNTER — Encounter: Payer: PPO | Admitting: *Deleted

## 2019-08-10 NOTE — Telephone Encounter (Signed)
Patient stated she went to the ortho 2 weeks ago and had shot in her back for sciatica. Patient states that she fell on that side and leg when she got home and the leg has been swollen since. She called ortho and the ortho told her it could have come from the shot or the fall but she needed to be seen for evaluation to make sure not blood clot

## 2019-08-10 NOTE — Telephone Encounter (Signed)
Patient has another appt with specialist in the pm tomorrow and wanted to see Dr Nicki Reaper in the am- Appointment scheduled.

## 2019-08-10 NOTE — Telephone Encounter (Signed)
Called pt. No answer, left message for pt to return call.  

## 2019-08-10 NOTE — Telephone Encounter (Signed)
u s tomorrow a m, ov tomorrow aft with dr scott virt or f to f

## 2019-08-10 NOTE — Telephone Encounter (Signed)
Patient was seen by orthopaedic doctor two week ago for injection and he noticed her right leg was swollen . He suggested that she get an ultra sound to see if she had a blood clot. Please advise [phone]

## 2019-08-10 NOTE — Telephone Encounter (Signed)
ntsw noticed it swollen two weeks ago and raised concerns then re clot???

## 2019-08-10 NOTE — Telephone Encounter (Signed)
Please give pt's daughter [name] a call pt fell a couple weeks ago and she's been having some swelling in her rt leg since then. They're not sure if it could be a blood clot but would like to have someone give her a call to discuss getting an ultrasound ordered. They have also called PCP office, Dr. Wolfgang Phoenix.   [name] @ [phone]

## 2019-08-11 ENCOUNTER — Encounter: Payer: Self-pay | Admitting: Family Medicine

## 2019-08-11 ENCOUNTER — Other Ambulatory Visit: Payer: Self-pay

## 2019-08-11 ENCOUNTER — Ambulatory Visit (INDEPENDENT_AMBULATORY_CARE_PROVIDER_SITE_OTHER): Payer: PPO | Admitting: Family Medicine

## 2019-08-11 ENCOUNTER — Ambulatory Visit (HOSPITAL_COMMUNITY)
Admission: RE | Admit: 2019-08-11 | Discharge: 2019-08-11 | Disposition: A | Discharge status: Home / Self Care | Payer: PPO | Source: Ambulatory Visit | Attending: Family Medicine | Admitting: Family Medicine

## 2019-08-11 VITALS — Temp 97.6°F

## 2019-08-11 DIAGNOSIS — R600 Localized edema: Secondary | ICD-10-CM

## 2019-08-11 DIAGNOSIS — M79661 Pain in right lower leg: Secondary | ICD-10-CM | POA: Insufficient documentation

## 2019-08-11 MED ORDER — PANTOPRAZOLE SODIUM 40 MG PO TBEC: 40.0000 mg | DELAYED_RELEASE_TABLET | Freq: Every day | ORAL | 5 refills | Status: AC

## 2019-08-11 NOTE — Telephone Encounter (Signed)
Pt has appointment with pcp to discuss this issue today @ 10.

## 2019-08-11 NOTE — Progress Notes (Signed)
   Subjective:    Patient ID: Kelsey Sandoval, female    DOB: Oct 13, 1932, 85 y.o.   MRN: [id number]  HPI Pt here for right foot swelling. Pt states she had an injection in her hip and then her leg/foot began to swell. Pt states no pain. Pt right knee is also swelling.  Patient took a fall 2 weeks ago having some swelling in the leg some tenderness in the calf muscle she was off of her Pradaxa for at least 3 days when this fall occurred she is back on her Pradaxa now she does have some swelling in the foot she denies any shortness of breath chest tightness pressure pain she denies fever chills sweats no nausea or vomiting.  On all of her medicines    Review of Systems  Constitutional: Negative for activity change, appetite change and fatigue.  HENT: Negative for congestion and rhinorrhea.   Respiratory: Negative for cough and shortness of breath.   Cardiovascular: Negative for chest pain and leg swelling.  Gastrointestinal: Negative for abdominal pain and diarrhea.  Endocrine: Negative for polydipsia and polyphagia.  Skin: Negative for color change.  Neurological: Negative for dizziness and weakness.  Psychiatric/Behavioral: Negative for behavioral problems and confusion.   complains of pain in the right lower leg    Objective:   Physical Exam  Lungs clear respiratory rate normal heart irregular rate controlled extremities some swelling in the right foot also tenderness in the calf and behind the knee severe osteoarthritis both knees bruising on the right knee noted  25 minutes was spent with the patient.  This statement verifies that 25 minutes was indeed spent with the patient.  More than 50% of this visit-total duration of the visit-was spent in counseling and coordination of care. The issues that the patient came in for today as reflected in the diagnosis (s) please refer to documentation for further details.     Assessment & Plan:  Significant trouble with osteoarthritis Frequent  falls Right leg pain and discomfort need to rule out DVT Warning signs discussed follow-up based upon the ultrasound results Continue current medications Encourage cane or walker use

## 2019-08-15 ENCOUNTER — Telehealth: Payer: Self-pay | Admitting: *Deleted

## 2019-08-15 NOTE — Telephone Encounter (Signed)
Called pt with results and she wanted me to ask dr Nicki Reaper if there was something else she could take for itching. Hydroxyzine 25 mg is not helping. She states she takes it every 6 hours. She sees skin doctor on Monday she states but wants something before then because she can hardly stand it. Pt aware we will call her back tomorrow.   [facility] [place].

## 2019-08-15 NOTE — Telephone Encounter (Signed)
Unfortunately there is nothing magical that could help She could take 1 to 2 tablets every 4-6 hours as needed if need to send in the new prescription let us know If she has enough to last her until she sees a dermatologist leave as such

## 2019-08-16 ENCOUNTER — Other Ambulatory Visit: Payer: Self-pay | Admitting: Family Medicine

## 2019-08-16 NOTE — Telephone Encounter (Signed)
Discussed with pt. Pt verbalized understanding.  °

## 2019-08-20 ENCOUNTER — Other Ambulatory Visit: Payer: Self-pay | Admitting: Family Medicine

## 2019-08-21 ENCOUNTER — Telehealth: Payer: Self-pay | Admitting: Family Medicine

## 2019-08-21 ENCOUNTER — Other Ambulatory Visit: Payer: Self-pay

## 2019-08-21 ENCOUNTER — Ambulatory Visit (INDEPENDENT_AMBULATORY_CARE_PROVIDER_SITE_OTHER): Payer: PPO | Admitting: Family Medicine

## 2019-08-21 ENCOUNTER — Encounter: Payer: Self-pay | Admitting: Family Medicine

## 2019-08-21 VITALS — BP 110/72 | Temp 97.7°F | Ht 68.0 in | Wt 160.2 lb

## 2019-08-21 DIAGNOSIS — R0600 Dyspnea, unspecified: Secondary | ICD-10-CM

## 2019-08-21 DIAGNOSIS — R0609 Other forms of dyspnea: Secondary | ICD-10-CM

## 2019-08-21 DIAGNOSIS — R5383 Other fatigue: Secondary | ICD-10-CM | POA: Diagnosis not present

## 2019-08-21 DIAGNOSIS — L738 Other specified follicular disorders: Secondary | ICD-10-CM | POA: Diagnosis not present

## 2019-08-21 DIAGNOSIS — R600 Localized edema: Secondary | ICD-10-CM

## 2019-08-21 DIAGNOSIS — L570 Actinic keratosis: Secondary | ICD-10-CM | POA: Diagnosis not present

## 2019-08-21 DIAGNOSIS — L298 Other pruritus: Secondary | ICD-10-CM | POA: Diagnosis not present

## 2019-08-21 NOTE — Progress Notes (Signed)
   Subjective:    Patient ID: Kelsey Sandoval, female    DOB: 04/05/1933, 85 y.o.   MRN: [id number]  HPI pt arrives with son [name]. Follow up right leg swelling has never went down and yesterday left leg started swelling.    Daughter linda wanted to know if  b-type natriuretic peptid test could be done.  Bilateral swelling in both legs Recently DVT ultrasound negative on the right leg She also bumped into her leg on the left side causing bruising She is on blood thinner and is consistent with taking it Denies PND denies orthopnea does get winded and out of breath with activity has to use a wheelchair for any significant distances uses a walker for short distances denies chest tightness pressure pain   Review of Systems  Constitutional: Negative for activity change, fatigue and fever.  HENT: Negative for congestion and rhinorrhea.   Respiratory: Positive for shortness of breath. Negative for cough and chest tightness.   Cardiovascular: Positive for leg swelling. Negative for chest pain.  Gastrointestinal: Negative for abdominal pain and nausea.  Skin: Negative for color change.  Neurological: Negative for dizziness and headaches.  Psychiatric/Behavioral: Negative for agitation and behavioral problems.       Objective:   Physical Exam Vitals signs reviewed.  Constitutional:      General: She is not in acute distress. HENT:     Head: Normocephalic and atraumatic.  Eyes:     General:        Right eye: No discharge.        Left eye: No discharge.  Neck:     Trachea: No tracheal deviation.  Cardiovascular:     Rate and Rhythm: Normal rate.     Heart sounds: Normal heart sounds. No murmur.  Pulmonary:     Effort: Pulmonary effort is normal. No respiratory distress.     Breath sounds: Normal breath sounds.  Lymphadenopathy:     Cervical: No cervical adenopathy.  Skin:    General: Skin is warm and dry.  Neurological:     Mental Status: She is alert.     Coordination:  Coordination normal.  Psychiatric:        Behavior: Behavior normal.     Pedal edema noted in both legs bruising noted on both legs no calf pain Fall Risk  08/11/2019 08/03/2019 11/18/2017 01/23/2016 07/19/2015  Falls in the past year? 1 1 No No No  Number falls in past yr: 1 1 - - -  Injury with Fall? 1 0 - - -  Risk for fall due to : Impaired mobility;Impaired balance/gait Impaired balance/gait;Impaired mobility - - -  Follow up Falls evaluation completed;Education provided;Falls prevention discussed Falls evaluation completed - - -        Assessment & Plan:  Fluid retention Patient just increase her Demadex to 1-1/2 tablet/day starting today Lab work ordered including BNP and 7 I doubt CHF issues more likely related to dependent edema and fluid retention hopefully diuretic adjustment will help We will see cardiology tomorrow follow-up here in a month follow-up sooner if not improving

## 2019-08-21 NOTE — Telephone Encounter (Signed)
Patient will do follow-up later today

## 2019-08-21 NOTE — Telephone Encounter (Signed)
Patient was seen on 11/6 for right left swelling and had test done but now states that her left leg is swelling now.Please advise

## 2019-08-21 NOTE — Telephone Encounter (Signed)
So is patient having swelling in both legs or just the left leg? Is it minimal swelling such as the foot and ankle or is it the whole lower leg?  this would need to be seen if swelling is significant Is she taking her blood thinner? We could work her in this afternoon

## 2019-08-21 NOTE — Telephone Encounter (Signed)
Patient states she hit the tub with her left leg last night and woke up with it swollen up and now both are swollen bad and she is starting to worry about her kidneys because she doesn't feel this is normal.

## 2019-08-21 NOTE — Telephone Encounter (Signed)
Patient scheduled office visit today with Dr Nicki Reaper for evaluation.

## 2019-08-22 ENCOUNTER — Ambulatory Visit (INDEPENDENT_AMBULATORY_CARE_PROVIDER_SITE_OTHER): Payer: PPO | Admitting: Internal Medicine

## 2019-08-22 ENCOUNTER — Encounter: Payer: PPO | Admitting: Internal Medicine

## 2019-08-22 ENCOUNTER — Encounter: Payer: Self-pay | Admitting: Internal Medicine

## 2019-08-22 VITALS — BP 126/83 | HR 74 | Temp 97.0°F | Ht 69.0 in | Wt 161.0 lb

## 2019-08-22 DIAGNOSIS — I4821 Permanent atrial fibrillation: Secondary | ICD-10-CM | POA: Diagnosis not present

## 2019-08-22 DIAGNOSIS — I10 Essential (primary) hypertension: Secondary | ICD-10-CM | POA: Diagnosis not present

## 2019-08-22 DIAGNOSIS — R0609 Other forms of dyspnea: Secondary | ICD-10-CM

## 2019-08-22 DIAGNOSIS — I5189 Other ill-defined heart diseases: Secondary | ICD-10-CM | POA: Diagnosis not present

## 2019-08-22 DIAGNOSIS — R0600 Dyspnea, unspecified: Secondary | ICD-10-CM

## 2019-08-22 LAB — BASIC METABOLIC PANEL
BUN/Creatinine Ratio: 14 (ref 12–28)
BUN: 21 mg/dL (ref 8–27)
CO2: 23 mmol/L (ref 20–29)
Calcium: 8 mg/dL — ABNORMAL LOW (ref 8.7–10.3)
Chloride: 104 mmol/L (ref 96–106)
Creatinine, Ser: 1.54 mg/dL — ABNORMAL HIGH (ref 0.57–1.00)
GFR calc Af Amer: 35 mL/min/{1.73_m2} — ABNORMAL LOW (ref >59)
GFR calc non Af Amer: 31 mL/min/{1.73_m2} — ABNORMAL LOW (ref >59)
Glucose: 106 mg/dL — ABNORMAL HIGH (ref 65–99)
Potassium: 3.7 mmol/L (ref 3.5–5.2)
Sodium: 142 mmol/L (ref 134–144)

## 2019-08-22 LAB — CBC WITH DIFFERENTIAL/PLATELET
Basophils Absolute: 0.1 10*3/uL (ref 0.0–0.2)
Basos: 1 %
EOS (ABSOLUTE): 0.4 10*3/uL (ref 0.0–0.4)
Eos: 5 %
Hematocrit: 40.3 % (ref 34.0–46.6)
Hemoglobin: 13.6 g/dL (ref 11.1–15.9)
Immature Grans (Abs): 0 10*3/uL (ref 0.0–0.1)
Immature Granulocytes: 0 %
Lymphocytes Absolute: 1.8 10*3/uL (ref 0.7–3.1)
Lymphs: 24 %
MCH: 30.6 pg (ref 26.6–33.0)
MCHC: 33.7 g/dL (ref 31.5–35.7)
MCV: 91 fL (ref 79–97)
Monocytes Absolute: 0.6 10*3/uL (ref 0.1–0.9)
Monocytes: 8 %
Neutrophils Absolute: 4.6 10*3/uL (ref 1.4–7.0)
Neutrophils: 62 %
Platelets: 285 10*3/uL (ref 150–450)
RBC: 4.45 x10E6/uL (ref 3.77–5.28)
RDW: 15 % (ref 11.7–15.4)
WBC: 7.5 10*3/uL (ref 3.4–10.8)

## 2019-08-22 LAB — BRAIN NATRIURETIC PEPTIDE: BNP: 451.9 pg/mL — ABNORMAL HIGH (ref 0.0–100.0)

## 2019-08-22 NOTE — Telephone Encounter (Signed)
error 

## 2019-08-22 NOTE — Patient Instructions (Addendum)
Medication Instructions:  Your physician has recommended you make the following change in your medication:   Stop Amlodipine   *If you need a refill on your cardiac medications before your next appointment, please call your pharmacy*  Lab Work: NONE  If you have labs (blood work) drawn today and your tests are completely normal, you will receive your results only by: Marland Kitchen MyChart Message (if you have MyChart) OR . A paper copy in the mail If you have any lab test that is abnormal or we need to change your treatment, we will call you to review the results.  Testing/Procedures: NONE   Follow-Up: At [facility], you and your health needs are our priority.  As part of our continuing mission to provide you with exceptional heart care, we have created designated Provider Care Teams.  These Care Teams include your primary Cardiologist (physician) and Advanced Practice Providers (APPs -  Physician Assistants and Nurse Practitioners) who all work together to provide you with the care you need, when you need it.  Your next appointment:   1 month(s)  The format for your next appointment:   In Person  Provider:   Cristopher Peru, MD  Other Instructions Thank you for choosing [facility]!

## 2019-08-22 NOTE — Progress Notes (Signed)
HPI The patient is a pleasant 85 yo woman with a h/o persistent atrial fib, symptomatic bradycardia, s/p PPM insertion. She has been stable. She has diastolic heart failure symptoms including sob and weakness.  She denies chest pain. No edema. Her pressures are better though she c/o being anxious when she goes to the doctors office. After we increased her amlodpine, her legs have swollen.  Allergies  Allergen Reactions  . Penicillins Other (See Comments)    Caused patient to pass out.Can take cephalosporins Has patient had a PCN reaction causing immediate rash, facial/tongue/throat swelling, SOB or lightheadedness with hypotension: no Has patient had a PCN reaction causing severe rash involving mucus membranes or skin necrosis: No Has patient had a PCN reaction that required hospitalization No Has patient had a PCN reaction occurring within the last 10 years: No If all of the above answers are "NO", then may proceed with Cephalosporin use.   . Levaquin [Levofloxacin] Nausea Only  . Sulfa Antibiotics Other (See Comments)    Unknown  . Zithromax [Azithromycin] Nausea Only and Rash     Current Outpatient Medications  Medication Sig Dispense Refill  . ALPRAZolam (XANAX) 0.5 MG tablet Take 1 tablet (0.5 mg total) by mouth 2 (two) times daily as needed for anxiety. Take one qhs 60 tablet 2  . amLODipine (NORVASC) 5 MG tablet Take 1 tablet (5 mg total) by mouth daily. 90 tablet 3  . budesonide-formoterol (SYMBICORT) 80-4.5 MCG/ACT inhaler Inhale 2 puffs into the lungs 2 (two) times daily. 1 Inhaler 12  . busPIRone (BUSPAR) 10 MG tablet Take 1 tablet (10 mg total) by mouth 3 (three) times daily. 270 tablet 2  . DULoxetine (CYMBALTA) 60 MG capsule Take 1 capsule (60 mg total) by mouth daily. 180 capsule 2  . gabapentin (NEURONTIN) 300 MG capsule TAKE 1 CAPSULE BY MOUTH THREE TIMES DAILY 90 capsule 5  . hydrALAZINE (APRESOLINE) 25 MG tablet TAKE 1 TABLET BY MOUTH EVERY 8 HOURS 90 tablet 5   . HYDROcodone-acetaminophen (NORCO) 10-325 MG tablet Take one tablet by mouth every 4 hours prn pain. Max 5/day 150 tablet 0  . HYDROcodone-acetaminophen (NORCO) 10-325 MG tablet Take one tablet by mouth every 4 hours prn pain. Max 5/day. 150 tablet 0  . HYDROcodone-acetaminophen (NORCO) 10-325 MG tablet Take one tablet by mouth every 4 hours prn pain. Max 5/day 150 tablet 0  . hydrOXYzine (ATARAX/VISTARIL) 10 MG tablet One bid prn nausea, caution drowsiness 40 tablet 2  . hydrOXYzine (ATARAX/VISTARIL) 25 MG tablet Take one tablet every 6 hours prn itching. Caution drowiness 30 tablet 2  . levothyroxine (SYNTHROID) 50 MCG tablet TAKE 1 TABLET BY MOUTH ONCE DAILY BEFORE BREAKFAST 90 tablet 1  . lisinopril (ZESTRIL) 10 MG tablet Take 5 mg by mouth. Takes one half of 10mg     . metoprolol succinate (TOPROL-XL) 50 MG 24 hr tablet TAKE 1 TABLET BY MOUTH TWICE DAILY.  TAKE WITH OR IMMEDIATELY FOLLOWING A MEAL 180 tablet 0  . ondansetron (ZOFRAN-ODT) 8 MG disintegrating tablet DISSOLVE 1 TABLET IN MOUTH THREE TIMES DAILY AS NEEDED FOR NAUSEA 18 tablet 6  . pantoprazole (PROTONIX) 40 MG tablet Take 1 tablet (40 mg total) by mouth daily. 30 tablet 5  . PARoxetine (PAXIL) 20 MG tablet Take 1 tablet (20 mg total) by mouth daily. 30 tablet 2  . PRADAXA 75 MG CAPS capsule Take 1 capsule by mouth twice daily 60 capsule 6  . pravastatin (PRAVACHOL) 80 MG tablet Take 1  tablet (80 mg total) by mouth every evening. 90 tablet 0  . PROAIR HFA 108 (90 Base) MCG/ACT inhaler INHALE 2 PUFFS INTO LUNGS EVERY 6 HOURS AS NEEDED FOR WHEEZING OR SHORTNESS OF BREATH 9 g 6  . torsemide (DEMADEX) 20 MG tablet TAKE 1 & 1/2 (ONE & ONE-HALF) TABLETS BY MOUTH IN THE MORNING 90 tablet 1  . triamcinolone cream (KENALOG) 0.1 % Apply 1 application topically 2 (two) times daily as needed. 60 g 2  . vitamin B-12 (CYANOCOBALAMIN) 1000 MCG tablet Take 1 tablet (1,000 mcg total) by mouth daily. (Patient taking differently: Take 1,000 mcg by  mouth every morning. )     No current facility-administered medications for this visit.      Past Medical History:  Diagnosis Date  . Anxiety   . Aortic atherosclerosis ([place]) 10/10/2018  . Arthritis   . Back pain, chronic    Sciatica  . Cataracts, bilateral   . COPD (chronic obstructive pulmonary disease) ([place]) 10/10/2018  . Depression   . Dysrhythmia    AFib  . Essential hypertension   . GERD (gastroesophageal reflux disease)   . Hyperlipidemia   . Hypothyroidism   . Membranous nephropathy determined by biopsy 06/07/2015  . Nephrotic syndrome    RHUX  . Neuropathy   . Osteopenia   . Pacemaker    Medtronic  . PAF (paroxysmal atrial fibrillation) ([place])   . Pre-diabetes   . Tachycardia-bradycardia syndrome ([place])     ROS:   All systems reviewed and negative except as noted in the HPI.   Past Surgical History:  Procedure Laterality Date  . ABDOMINAL HYSTERECTOMY     partial-pt has no ovaries  . ANTERIOR VITRECTOMY Right 03/11/2018   Procedure: ANTERIOR VITRECTOMY;  Surgeon: Baruch Goldmann, MD;  Location: [facility];  Service: Ophthalmology;  Laterality: Right;  . APPENDECTOMY    . BACK SURGERY    . BIOPSY N/A 01/17/2014   Procedure: BIOPSY;  Surgeon: Rogene Houston, MD;  Location: [facility];  Service: Endoscopy;  Laterality: N/A;  . CATARACT EXTRACTION W/PHACO Left 02/28/2015   Procedure: CATARACT EXTRACTION PHACO AND INTRAOCULAR LENS PLACEMENT (IOC);  Surgeon: [name], MD;  Location: [facility];  Service: Ophthalmology;  Laterality: Left;  CDE 18.71  . CATARACT EXTRACTION W/PHACO Right 03/11/2018   Procedure: CATARACT EXTRACTION PHACO  AND INTRAOCULAR LENS PLACEMENT RIGHT EYE ;  Surgeon: Baruch Goldmann, MD;  Location: [facility];  Service: Ophthalmology;  Laterality: Right;  CDE: 19.39  . COLONOSCOPY  9/05  . COLONOSCOPY N/A 10/11/2013   Procedure: COLONOSCOPY;  Surgeon: Rogene Houston, MD;  Location: [facility];  Service: Endoscopy;  Laterality: N/A;  1200  .  ESOPHAGOGASTRODUODENOSCOPY N/A 01/17/2014   Procedure: ESOPHAGOGASTRODUODENOSCOPY (EGD);  Surgeon: Rogene Houston, MD;  [facility]: [facility];  Service: Endoscopy;  Laterality: N/A;  230  . ESOPHAGOGASTRODUODENOSCOPY N/A 01/02/2016   Procedure: ESOPHAGOGASTRODUODENOSCOPY (EGD);  Surgeon: Rogene Houston, MD;  Location: [facility];  Service: Endoscopy;  Laterality: N/A;  240  . INSERT / REPLACE / REMOVE PACEMAKER     2012  . Wisdom tooth extracton       Family History  Problem Relation Age of Onset  . Colon cancer Brother   . Anxiety disorder Sister   . Depression Sister   . Anxiety disorder Sister   . Depression Sister      Social History   Socioeconomic History  . Marital status: Divorced    Spouse name: Not on  file  . Number of children: Not on file  . Years of education: Not on file  . Highest education level: Not on file  Occupational History  . Occupation: retired  Scientific laboratory technician  . Financial resource strain: Not on file  . Food insecurity    Worry: Not on file    Inability: Not on file  . Transportation needs    Medical: Not on file    Non-medical: Not on file  Tobacco Use  . Smoking status: Never Smoker  . Smokeless tobacco: Never Used  Substance and Sexual Activity  . Alcohol use: No    Alcohol/week: 0.0 standard drinks  . Drug use: No  . Sexual activity: Not Currently    Birth control/protection: None  Lifestyle  . Physical activity    Days per week: Not on file    Minutes per session: Not on file  . Stress: Not on file  Relationships  . Social Herbalist on phone: Not on file    Gets together: Not on file    Attends religious service: Not on file    Active member of club or organization: Not on file    Attends meetings of clubs or organizations: Not on file    Relationship status: Not on file  . Intimate partner violence    Fear of current or ex partner: Not on file    Emotionally abused: Not on file    Physically abused: Not on  file    Forced sexual activity: Not on file  Other Topics Concern  . Not on file  Social History Narrative  . Not on file     BP 126/83 (BP Location: Right Arm)   Pulse 74   Temp (!) 97 F (36.1 C)   Ht 5\' 9"  (1.753 m)   Wt 161 lb (73 kg)   SpO2 92%   BMI 23.78 kg/m   Physical Exam:  Stable but elderly appearing woman, NAD HEENT: Unremarkable Neck:  6 cm JVD, no thyromegally Lymphatics:  No adenopathy Back:  No CVA tenderness Lungs:  Clear with no wheezes HEART:  Regular rate rhythm, no murmurs, no rubs, no clicks Abd:  soft, positive bowel sounds, no organomegally, no rebound, no guarding Ext:  2 plus pulses, 2+ edema, no cyanosis, no clubbing Skin:  No rashes no nodules Neuro:  CN II through XII intact, motor grossly intact  DEVICE  Normal device function.  See PaceArt for details.   Assess/Plan: 1. Atrial fib - she is chronically in atrial fib. Her rates are controlled. 2. PPM - her medtronic DDD PM is programmed VVIR and her device is working normally. We will recheck in several months. 3. HTN - her blood pressure is better but she has developed edema and I have asked her to reduce her salt intake. 4. Peripheral edema - I have asked her to stop the amlodipine.  We will see her back in several weeks to see how her volume overload is doing.  Mikle Bosworth.D

## 2019-08-29 ENCOUNTER — Telehealth: Payer: Self-pay | Admitting: Family Medicine

## 2019-08-29 NOTE — Telephone Encounter (Signed)
So there is no perfect answer  Is a patient having increased swelling in her legs compared to when the last time she was here to see [place]?  Does the patient noticed shortness of breath more if she tries to lay down? I imagine she also notices the shortness of breath with walking around as well?  He is the patient consistently taking the Demadex 1-1/2 each morning?

## 2019-08-29 NOTE — Telephone Encounter (Signed)
Pt states the swelling has went completely down. Laying down and sitting she has no issues. After brushing teeth, washing face and using the bathroom, she went to fix breakfast and she states that she felt as if she was going to pass out. Pt is taking Demadex 1 1/2 tablets each morning. Please advise. Thank you

## 2019-08-29 NOTE — Telephone Encounter (Signed)
So it is going to be pretty much impossible to evaluate this by phone I would recommend a follow-up with [place] or the cardiologist in person patient may need to have additional lab work as well Choices would be Wednesday with me Friday with Dr. Richardson Landry Or with [place] next week Or with cardiology whenever they can get her in

## 2019-08-29 NOTE — Telephone Encounter (Signed)
Pt contacted and verbalized understanding. Pt transferred up front to set up appt

## 2019-08-29 NOTE — Telephone Encounter (Signed)
Pt contacted office and states that she is short of breath when doing things around the house. Pt states that the cardiologist took her off of one of her blood pressure medications last week. Pt states she is now taking 1.5 tablets of Lasix (med list does not reflect Lasix however it does say Torsemide 20 mg). Pt not having any headaches, chest pain, or any vision issues. Please advise. Thank you

## 2019-09-04 ENCOUNTER — Encounter: Payer: Self-pay | Admitting: Family Medicine

## 2019-09-04 ENCOUNTER — Ambulatory Visit (INDEPENDENT_AMBULATORY_CARE_PROVIDER_SITE_OTHER): Payer: PPO | Admitting: Family Medicine

## 2019-09-04 ENCOUNTER — Other Ambulatory Visit: Payer: Self-pay

## 2019-09-04 VITALS — BP 132/76 | Temp 97.1°F

## 2019-09-04 DIAGNOSIS — N1832 Chronic kidney disease, stage 3b: Secondary | ICD-10-CM

## 2019-09-04 DIAGNOSIS — J439 Emphysema, unspecified: Secondary | ICD-10-CM | POA: Diagnosis not present

## 2019-09-04 DIAGNOSIS — H5704 Mydriasis: Secondary | ICD-10-CM

## 2019-09-04 DIAGNOSIS — M6281 Muscle weakness (generalized): Secondary | ICD-10-CM

## 2019-09-04 DIAGNOSIS — R270 Ataxia, unspecified: Secondary | ICD-10-CM | POA: Diagnosis not present

## 2019-09-04 DIAGNOSIS — I480 Paroxysmal atrial fibrillation: Secondary | ICD-10-CM | POA: Diagnosis not present

## 2019-09-04 DIAGNOSIS — R0609 Other forms of dyspnea: Secondary | ICD-10-CM

## 2019-09-04 DIAGNOSIS — R0600 Dyspnea, unspecified: Secondary | ICD-10-CM | POA: Diagnosis not present

## 2019-09-04 DIAGNOSIS — E038 Other specified hypothyroidism: Secondary | ICD-10-CM

## 2019-09-04 MED ORDER — PREDNISONE 20 MG PO TABS: ORAL_TABLET | ORAL | 0 refills | Status: DC

## 2019-09-04 MED ORDER — ONDANSETRON HCL 8 MG PO TABS: 8.0000 mg | ORAL_TABLET | Freq: Three times a day (TID) | ORAL | 0 refills | Status: AC | PRN

## 2019-09-04 MED ORDER — BUDESONIDE-FORMOTEROL FUMARATE 160-4.5 MCG/ACT IN AERO: 2.0000 | INHALATION_SPRAY | Freq: Two times a day (BID) | RESPIRATORY_TRACT | 3 refills | Status: AC

## 2019-09-04 NOTE — Progress Notes (Signed)
Subjective:    Patient ID: Kelsey Sandoval, female    DOB: 1933-06-28, 85 y.o.   MRN: [id number]  HPI Pt here today for fatigue. Pt states she gives out easily with just doing household chores and everyday ADLs. Pt is having some swelling in ankles.  Pt daughter did send my chart message with full details.   Dot is coming in today due to feet swelling again. Swelling went down after last visit and increasing meds to 30mg .  She went back to 20mg  once the swelling decreased, stating it made her tired. Started back on 30mg  on 11/28. BP has been much higher than usual since the one BP med was removed.  Put this back in her meds for this week. Continues to complain of nausea each AM. Could we try Phenergan? States Zofran makes her sleepy. BIGGEST concern today is her falling.  She will not tell you she falls as my brother makes her believe she will be committed to a facility if she does. She fell again last night.  She was standing and reaching up into a cabinet. States her legs just give out and she feels like she is going to pass out.  States this started when she had inj. in back. Can we get a PT in to help with strengthening? Any recommendations on how to get care in the home?  Ulanda Edison. [phone]  The above information from my chart was verified patient gets short of breath with activity she gets short of breath moving from room to room The patient relates that she has very little energy.  She also states her appetite is not good either. She has not been able to use her inhaler correctly because of difficulty coordinating it Review of Systems  Constitutional: Negative for activity change, appetite change and fatigue.  HENT: Negative for congestion and rhinorrhea.   Respiratory: Negative for cough and shortness of breath.   Cardiovascular: Negative for chest pain and leg swelling.  Gastrointestinal: Positive for nausea. Negative for abdominal pain and diarrhea.  Endocrine: Negative for  polydipsia and polyphagia.  Skin: Negative for color change.  Neurological: Negative for dizziness and weakness.  Psychiatric/Behavioral: Negative for behavioral problems and confusion.       Objective:   Physical Exam Vitals signs reviewed.  Constitutional:      General: She is not in acute distress. HENT:     Head: Normocephalic and atraumatic.  Eyes:     General:        Right eye: No discharge.        Left eye: No discharge.  Neck:     Trachea: No tracheal deviation.  Cardiovascular:     Rate and Rhythm: Normal rate and regular rhythm.     Heart sounds: Normal heart sounds. No murmur.  Pulmonary:     Effort: Pulmonary effort is normal. No respiratory distress.     Breath sounds: Normal breath sounds.  Lymphadenopathy:     Cervical: No cervical adenopathy.  Skin:    General: Skin is warm and dry.  Neurological:     Mental Status: She is alert.     Coordination: Coordination normal.  Psychiatric:        Behavior: Behavior normal.     Patient has pupil dilation on the right side compared to the left side this is a new finding she denies any type of severe headache Patient with minimal swelling in the ankles     Assessment & Plan:  HTN blood pressure reading here actually looks good.  I would stick with current medications you are doing.  DOE-I doubt that this is heart failure her echo earlier this year showed good ejection fraction I think it is related to either her lungs, deconditioning, or possibly worsening of her underlying medical illnesses lab work ordered await the results Chest x-ray ordered Increase Symbicort to the higher dose Warnings were discussed in detail 25 minutes was spent with the patient.  This statement verifies that 25 minutes was indeed spent with the patient.  More than 50% of this visit-total duration of the visit-was spent in counseling and coordination of care. The issues that the patient came in for today as reflected in the diagnosis (s)  please refer to documentation for further details.  Patient has chronic pain continue current medication.  Patient has had a couple falls because her legs got weak she would benefit from physical therapy and further evaluation  This patient also would benefit from home health for evaluation of her on a periodic basis make sure she is using her medicines correctly, her daughter does a good job looking in on her, but also the patient would benefit from physical therapy for ataxia and falling and strengthening plus also would benefit from a Education officer, museum from home health trying to intervene to see if there is any aids who may be able to help her with bathing for simple ADLs  Hold off on CT of the head for now

## 2019-09-05 ENCOUNTER — Other Ambulatory Visit: Payer: Self-pay

## 2019-09-05 LAB — CBC WITH DIFFERENTIAL/PLATELET
Basophils Absolute: 0.1 10*3/uL (ref 0.0–0.2)
Basos: 1 %
EOS (ABSOLUTE): 0.2 10*3/uL (ref 0.0–0.4)
Eos: 2 %
Hematocrit: 43.5 % (ref 34.0–46.6)
Hemoglobin: 14.8 g/dL (ref 11.1–15.9)
Immature Grans (Abs): 0 10*3/uL (ref 0.0–0.1)
Immature Granulocytes: 0 %
Lymphocytes Absolute: 1.2 10*3/uL (ref 0.7–3.1)
Lymphs: 12 %
MCH: 32 pg (ref 26.6–33.0)
MCHC: 34 g/dL (ref 31.5–35.7)
MCV: 94 fL (ref 79–97)
Monocytes Absolute: 0.7 10*3/uL (ref 0.1–0.9)
Monocytes: 7 %
Neutrophils Absolute: 7.6 10*3/uL — ABNORMAL HIGH (ref 1.4–7.0)
Neutrophils: 78 %
Platelets: 264 10*3/uL (ref 150–450)
RBC: 4.62 x10E6/uL (ref 3.77–5.28)
RDW: 15.3 % (ref 11.7–15.4)
WBC: 9.9 10*3/uL (ref 3.4–10.8)

## 2019-09-05 LAB — PREALBUMIN: PREALBUMIN: 14 mg/dL (ref 9–32)

## 2019-09-05 LAB — BASIC METABOLIC PANEL
BUN/Creatinine Ratio: 14 (ref 12–28)
BUN: 21 mg/dL (ref 8–27)
CO2: 24 mmol/L (ref 20–29)
Calcium: 8.2 mg/dL — ABNORMAL LOW (ref 8.7–10.3)
Chloride: 102 mmol/L (ref 96–106)
Creatinine, Ser: 1.54 mg/dL — ABNORMAL HIGH (ref 0.57–1.00)
GFR calc Af Amer: 35 mL/min/{1.73_m2} — ABNORMAL LOW (ref >59)
GFR calc non Af Amer: 31 mL/min/{1.73_m2} — ABNORMAL LOW (ref >59)
Glucose: 113 mg/dL — ABNORMAL HIGH (ref 65–99)
Potassium: 3.5 mmol/L (ref 3.5–5.2)
Sodium: 143 mmol/L (ref 134–144)

## 2019-09-05 LAB — BRAIN NATRIURETIC PEPTIDE: BNP: 627.7 pg/mL — ABNORMAL HIGH (ref 0.0–100.0)

## 2019-09-05 MED ORDER — AMLODIPINE BESYLATE 5 MG PO TABS: 5.0000 mg | ORAL_TABLET | Freq: Every day | ORAL | 1 refills | Status: DC

## 2019-09-05 NOTE — Progress Notes (Signed)
Pt O2 was 93%. Pt is currently on Amlodipine 5 mg daily.

## 2019-09-07 ENCOUNTER — Ambulatory Visit (HOSPITAL_COMMUNITY)
Admission: RE | Admit: 2019-09-07 | Discharge: 2019-09-07 | Disposition: A | Discharge status: Home / Self Care | Payer: PPO | Source: Ambulatory Visit | Attending: Family Medicine | Admitting: Family Medicine

## 2019-09-07 ENCOUNTER — Other Ambulatory Visit: Payer: Self-pay

## 2019-09-07 DIAGNOSIS — R0600 Dyspnea, unspecified: Secondary | ICD-10-CM | POA: Diagnosis not present

## 2019-09-07 DIAGNOSIS — H5704 Mydriasis: Secondary | ICD-10-CM | POA: Insufficient documentation

## 2019-09-07 DIAGNOSIS — R0602 Shortness of breath: Secondary | ICD-10-CM | POA: Diagnosis not present

## 2019-09-07 DIAGNOSIS — R270 Ataxia, unspecified: Secondary | ICD-10-CM | POA: Insufficient documentation

## 2019-09-07 DIAGNOSIS — M6281 Muscle weakness (generalized): Secondary | ICD-10-CM | POA: Insufficient documentation

## 2019-09-08 ENCOUNTER — Telehealth: Payer: Self-pay | Admitting: Family Medicine

## 2019-09-08 NOTE — Telephone Encounter (Signed)
Please see chest x-ray result please relay this to the daughter If the patient seems to be getting worse next week we can move up the follow-up office visit Otherwise please initiate higher dose of diuretic along with doing the lab work in 1 week next Thursday metabolic 7 And also keeping follow-up visit on the 17th thank you

## 2019-09-08 NOTE — Addendum Note (Signed)
Addended by: Vicente Males on: 09/08/2019 04:47 PM   Modules accepted: Orders

## 2019-09-08 NOTE — Telephone Encounter (Signed)
Pt's daughter calling checking on xray results

## 2019-09-08 NOTE — Telephone Encounter (Signed)
Contacted daughter and daughter verbalized understanding. Daughter given results of xray and was informed of increasing diuretic and repeat lab work. Pt daughter states they will keep follow up

## 2019-09-08 NOTE — Telephone Encounter (Signed)
Would like results of CXR done yesterday

## 2019-09-10 DIAGNOSIS — I130 Hypertensive heart and chronic kidney disease with heart failure and stage 1 through stage 4 chronic kidney disease, or unspecified chronic kidney disease: Secondary | ICD-10-CM | POA: Diagnosis not present

## 2019-09-10 DIAGNOSIS — G8929 Other chronic pain: Secondary | ICD-10-CM | POA: Diagnosis not present

## 2019-09-10 DIAGNOSIS — M6281 Muscle weakness (generalized): Secondary | ICD-10-CM | POA: Diagnosis not present

## 2019-09-10 DIAGNOSIS — I509 Heart failure, unspecified: Secondary | ICD-10-CM | POA: Diagnosis not present

## 2019-09-10 DIAGNOSIS — I480 Paroxysmal atrial fibrillation: Secondary | ICD-10-CM | POA: Diagnosis not present

## 2019-09-10 DIAGNOSIS — N1832 Chronic kidney disease, stage 3b: Secondary | ICD-10-CM | POA: Diagnosis not present

## 2019-09-10 DIAGNOSIS — Z791 Long term (current) use of non-steroidal anti-inflammatories (NSAID): Secondary | ICD-10-CM | POA: Diagnosis not present

## 2019-09-10 DIAGNOSIS — H5704 Mydriasis: Secondary | ICD-10-CM | POA: Diagnosis not present

## 2019-09-10 DIAGNOSIS — Z9181 History of falling: Secondary | ICD-10-CM | POA: Diagnosis not present

## 2019-09-10 DIAGNOSIS — Z79891 Long term (current) use of opiate analgesic: Secondary | ICD-10-CM | POA: Diagnosis not present

## 2019-09-10 DIAGNOSIS — J439 Emphysema, unspecified: Secondary | ICD-10-CM | POA: Diagnosis not present

## 2019-09-10 DIAGNOSIS — E038 Other specified hypothyroidism: Secondary | ICD-10-CM | POA: Diagnosis not present

## 2019-09-10 DIAGNOSIS — R270 Ataxia, unspecified: Secondary | ICD-10-CM | POA: Diagnosis not present

## 2019-09-11 ENCOUNTER — Telehealth: Payer: Self-pay | Admitting: Family Medicine

## 2019-09-11 NOTE — Telephone Encounter (Signed)
Verbal orders given  

## 2019-09-11 NOTE — Telephone Encounter (Signed)
It would be fine to go ahead and give permission

## 2019-09-11 NOTE — Telephone Encounter (Signed)
Kelsey Sandoval with [facility] calling requesting skilled nursing 2 x 3 weeks and an evaluation for OT.   CB# [phone]

## 2019-09-16 ENCOUNTER — Encounter (HOSPITAL_COMMUNITY): Payer: Self-pay

## 2019-09-16 ENCOUNTER — Emergency Department (HOSPITAL_COMMUNITY): Payer: PPO

## 2019-09-16 ENCOUNTER — Inpatient Hospital Stay (HOSPITAL_COMMUNITY)
Admission: EM | Admit: 2019-09-16 | Discharge: 2019-09-20 | DRG: 092 | Disposition: A | Discharge status: Skilled Nursing Facility | Payer: PPO | Attending: Family Medicine | Admitting: Family Medicine

## 2019-09-16 ENCOUNTER — Other Ambulatory Visit: Payer: Self-pay

## 2019-09-16 DIAGNOSIS — I129 Hypertensive chronic kidney disease with stage 1 through stage 4 chronic kidney disease, or unspecified chronic kidney disease: Secondary | ICD-10-CM | POA: Diagnosis not present

## 2019-09-16 DIAGNOSIS — Z818 Family history of other mental and behavioral disorders: Secondary | ICD-10-CM

## 2019-09-16 DIAGNOSIS — S199XXA Unspecified injury of neck, initial encounter: Secondary | ICD-10-CM | POA: Diagnosis not present

## 2019-09-16 DIAGNOSIS — I482 Chronic atrial fibrillation, unspecified: Secondary | ICD-10-CM | POA: Diagnosis not present

## 2019-09-16 DIAGNOSIS — F419 Anxiety disorder, unspecified: Secondary | ICD-10-CM | POA: Diagnosis not present

## 2019-09-16 DIAGNOSIS — R778 Other specified abnormalities of plasma proteins: Secondary | ICD-10-CM | POA: Diagnosis present

## 2019-09-16 DIAGNOSIS — M109 Gout, unspecified: Secondary | ICD-10-CM | POA: Diagnosis present

## 2019-09-16 DIAGNOSIS — R7303 Prediabetes: Secondary | ICD-10-CM | POA: Diagnosis present

## 2019-09-16 DIAGNOSIS — W19XXXA Unspecified fall, initial encounter: Secondary | ICD-10-CM

## 2019-09-16 DIAGNOSIS — N183 Chronic kidney disease, stage 3 unspecified: Secondary | ICD-10-CM | POA: Diagnosis present

## 2019-09-16 DIAGNOSIS — S80212A Abrasion, left knee, initial encounter: Secondary | ICD-10-CM | POA: Diagnosis present

## 2019-09-16 DIAGNOSIS — S0101XA Laceration without foreign body of scalp, initial encounter: Secondary | ICD-10-CM | POA: Diagnosis not present

## 2019-09-16 DIAGNOSIS — M5430 Sciatica, unspecified side: Secondary | ICD-10-CM | POA: Diagnosis present

## 2019-09-16 DIAGNOSIS — M7989 Other specified soft tissue disorders: Secondary | ICD-10-CM | POA: Diagnosis not present

## 2019-09-16 DIAGNOSIS — Z79899 Other long term (current) drug therapy: Secondary | ICD-10-CM

## 2019-09-16 DIAGNOSIS — Z87441 Personal history of nephrotic syndrome: Secondary | ICD-10-CM | POA: Diagnosis not present

## 2019-09-16 DIAGNOSIS — S0003XA Contusion of scalp, initial encounter: Secondary | ICD-10-CM | POA: Diagnosis present

## 2019-09-16 DIAGNOSIS — S0511XA Contusion of eyeball and orbital tissues, right eye, initial encounter: Secondary | ICD-10-CM | POA: Diagnosis present

## 2019-09-16 DIAGNOSIS — G2581 Restless legs syndrome: Secondary | ICD-10-CM | POA: Diagnosis present

## 2019-09-16 DIAGNOSIS — Z9842 Cataract extraction status, left eye: Secondary | ICD-10-CM

## 2019-09-16 DIAGNOSIS — M8580 Other specified disorders of bone density and structure, unspecified site: Secondary | ICD-10-CM | POA: Diagnosis present

## 2019-09-16 DIAGNOSIS — Z79891 Long term (current) use of opiate analgesic: Secondary | ICD-10-CM

## 2019-09-16 DIAGNOSIS — K219 Gastro-esophageal reflux disease without esophagitis: Secondary | ICD-10-CM | POA: Diagnosis not present

## 2019-09-16 DIAGNOSIS — S8992XA Unspecified injury of left lower leg, initial encounter: Secondary | ICD-10-CM | POA: Diagnosis not present

## 2019-09-16 DIAGNOSIS — Z880 Allergy status to penicillin: Secondary | ICD-10-CM

## 2019-09-16 DIAGNOSIS — W1830XA Fall on same level, unspecified, initial encounter: Secondary | ICD-10-CM | POA: Diagnosis present

## 2019-09-16 DIAGNOSIS — R296 Repeated falls: Secondary | ICD-10-CM | POA: Diagnosis not present

## 2019-09-16 DIAGNOSIS — G894 Chronic pain syndrome: Secondary | ICD-10-CM | POA: Diagnosis present

## 2019-09-16 DIAGNOSIS — E039 Hypothyroidism, unspecified: Secondary | ICD-10-CM | POA: Diagnosis present

## 2019-09-16 DIAGNOSIS — Z7951 Long term (current) use of inhaled steroids: Secondary | ICD-10-CM

## 2019-09-16 DIAGNOSIS — Z9841 Cataract extraction status, right eye: Secondary | ICD-10-CM

## 2019-09-16 DIAGNOSIS — I480 Paroxysmal atrial fibrillation: Secondary | ICD-10-CM | POA: Diagnosis present

## 2019-09-16 DIAGNOSIS — R220 Localized swelling, mass and lump, head: Secondary | ICD-10-CM | POA: Diagnosis not present

## 2019-09-16 DIAGNOSIS — E785 Hyperlipidemia, unspecified: Secondary | ICD-10-CM | POA: Diagnosis present

## 2019-09-16 DIAGNOSIS — S3993XA Unspecified injury of pelvis, initial encounter: Secondary | ICD-10-CM | POA: Diagnosis not present

## 2019-09-16 DIAGNOSIS — S80211A Abrasion, right knee, initial encounter: Secondary | ICD-10-CM | POA: Diagnosis present

## 2019-09-16 DIAGNOSIS — Z20828 Contact with and (suspected) exposure to other viral communicable diseases: Secondary | ICD-10-CM | POA: Diagnosis not present

## 2019-09-16 DIAGNOSIS — S41011A Laceration without foreign body of right shoulder, initial encounter: Secondary | ICD-10-CM | POA: Diagnosis not present

## 2019-09-16 DIAGNOSIS — S8991XA Unspecified injury of right lower leg, initial encounter: Secondary | ICD-10-CM | POA: Diagnosis not present

## 2019-09-16 DIAGNOSIS — S4991XA Unspecified injury of right shoulder and upper arm, initial encounter: Secondary | ICD-10-CM | POA: Diagnosis not present

## 2019-09-16 DIAGNOSIS — S0990XA Unspecified injury of head, initial encounter: Secondary | ICD-10-CM | POA: Diagnosis not present

## 2019-09-16 DIAGNOSIS — Z882 Allergy status to sulfonamides status: Secondary | ICD-10-CM

## 2019-09-16 DIAGNOSIS — T424X5A Adverse effect of benzodiazepines, initial encounter: Secondary | ICD-10-CM | POA: Diagnosis present

## 2019-09-16 DIAGNOSIS — G629 Polyneuropathy, unspecified: Secondary | ICD-10-CM | POA: Diagnosis not present

## 2019-09-16 DIAGNOSIS — R0602 Shortness of breath: Secondary | ICD-10-CM | POA: Diagnosis not present

## 2019-09-16 DIAGNOSIS — J449 Chronic obstructive pulmonary disease, unspecified: Secondary | ICD-10-CM | POA: Diagnosis present

## 2019-09-16 DIAGNOSIS — Z7901 Long term (current) use of anticoagulants: Secondary | ICD-10-CM | POA: Diagnosis not present

## 2019-09-16 DIAGNOSIS — S299XXA Unspecified injury of thorax, initial encounter: Secondary | ICD-10-CM | POA: Diagnosis not present

## 2019-09-16 DIAGNOSIS — I700 Atherosclerosis of aorta: Secondary | ICD-10-CM | POA: Diagnosis present

## 2019-09-16 DIAGNOSIS — M6281 Muscle weakness (generalized): Secondary | ICD-10-CM | POA: Diagnosis not present

## 2019-09-16 DIAGNOSIS — Z950 Presence of cardiac pacemaker: Secondary | ICD-10-CM

## 2019-09-16 DIAGNOSIS — I1310 Hypertensive heart and chronic kidney disease without heart failure, with stage 1 through stage 4 chronic kidney disease, or unspecified chronic kidney disease: Secondary | ICD-10-CM | POA: Diagnosis not present

## 2019-09-16 DIAGNOSIS — Z79890 Hormone replacement therapy (postmenopausal): Secondary | ICD-10-CM

## 2019-09-16 DIAGNOSIS — E876 Hypokalemia: Secondary | ICD-10-CM | POA: Diagnosis present

## 2019-09-16 DIAGNOSIS — R41841 Cognitive communication deficit: Secondary | ICD-10-CM | POA: Diagnosis not present

## 2019-09-16 DIAGNOSIS — Y92009 Unspecified place in unspecified non-institutional (private) residence as the place of occurrence of the external cause: Secondary | ICD-10-CM | POA: Diagnosis not present

## 2019-09-16 DIAGNOSIS — T148XXA Other injury of unspecified body region, initial encounter: Secondary | ICD-10-CM

## 2019-09-16 DIAGNOSIS — Z881 Allergy status to other antibiotic agents status: Secondary | ICD-10-CM

## 2019-09-16 DIAGNOSIS — Z961 Presence of intraocular lens: Secondary | ICD-10-CM | POA: Diagnosis present

## 2019-09-16 DIAGNOSIS — F325 Major depressive disorder, single episode, in full remission: Secondary | ICD-10-CM | POA: Diagnosis not present

## 2019-09-16 DIAGNOSIS — T40605A Adverse effect of unspecified narcotics, initial encounter: Secondary | ICD-10-CM | POA: Diagnosis not present

## 2019-09-16 DIAGNOSIS — Z90711 Acquired absence of uterus with remaining cervical stump: Secondary | ICD-10-CM

## 2019-09-16 DIAGNOSIS — I5041 Acute combined systolic (congestive) and diastolic (congestive) heart failure: Secondary | ICD-10-CM | POA: Diagnosis not present

## 2019-09-16 DIAGNOSIS — I495 Sick sinus syndrome: Secondary | ICD-10-CM | POA: Diagnosis present

## 2019-09-16 DIAGNOSIS — Z800 Family history of malignant neoplasm of digestive organs: Secondary | ICD-10-CM

## 2019-09-16 LAB — COMPREHENSIVE METABOLIC PANEL
ALT: 26 U/L (ref 0–44)
AST: 32 U/L (ref 15–41)
Albumin: 2.3 g/dL — ABNORMAL LOW (ref 3.5–5.0)
Alkaline Phosphatase: 98 U/L (ref 38–126)
Anion gap: 12 (ref 5–15)
BUN: 26 mg/dL — ABNORMAL HIGH (ref 8–23)
CO2: 30 mmol/L (ref 22–32)
Calcium: 8 mg/dL — ABNORMAL LOW (ref 8.9–10.3)
Chloride: 97 mmol/L — ABNORMAL LOW (ref 98–111)
Creatinine, Ser: 1.47 mg/dL — ABNORMAL HIGH (ref 0.44–1.00)
GFR calc Af Amer: 38 mL/min — ABNORMAL LOW (ref >60)
GFR calc non Af Amer: 32 mL/min — ABNORMAL LOW (ref >60)
Glucose, Bld: 99 mg/dL (ref 70–99)
Potassium: 3 mmol/L — ABNORMAL LOW (ref 3.5–5.1)
Sodium: 139 mmol/L (ref 135–145)
Total Bilirubin: 1.1 mg/dL (ref 0.3–1.2)
Total Protein: 5.2 g/dL — ABNORMAL LOW (ref 6.5–8.1)

## 2019-09-16 LAB — CBC WITH DIFFERENTIAL/PLATELET
Abs Immature Granulocytes: 0.11 10*3/uL — ABNORMAL HIGH (ref 0.00–0.07)
Basophils Absolute: 0 10*3/uL (ref 0.0–0.1)
Basophils Relative: 0 %
Eosinophils Absolute: 0 10*3/uL (ref 0.0–0.5)
Eosinophils Relative: 0 %
HCT: 44.9 % (ref 36.0–46.0)
Hemoglobin: 14.8 g/dL (ref 12.0–15.0)
Immature Granulocytes: 1 %
Lymphocytes Relative: 7 %
Lymphs Abs: 1.3 10*3/uL (ref 0.7–4.0)
MCH: 31.6 pg (ref 26.0–34.0)
MCHC: 33 g/dL (ref 30.0–36.0)
MCV: 95.9 fL (ref 80.0–100.0)
Monocytes Absolute: 1.4 10*3/uL — ABNORMAL HIGH (ref 0.1–1.0)
Monocytes Relative: 7 %
Neutro Abs: 16.3 10*3/uL — ABNORMAL HIGH (ref 1.7–7.7)
Neutrophils Relative %: 85 %
Platelets: 252 10*3/uL (ref 150–400)
RBC: 4.68 MIL/uL (ref 3.87–5.11)
RDW: 14.7 % (ref 11.5–15.5)
WBC: 19.1 10*3/uL — ABNORMAL HIGH (ref 4.0–10.5)
nRBC: 0 % (ref 0.0–0.2)

## 2019-09-16 LAB — TROPONIN I (HIGH SENSITIVITY)
Troponin I (High Sensitivity): 39 ng/L — ABNORMAL HIGH (ref <18)
Troponin I (High Sensitivity): 39 ng/L — ABNORMAL HIGH (ref <18)

## 2019-09-16 LAB — SARS CORONAVIRUS 2 (TAT 6-24 HRS): SARS Coronavirus 2: NEGATIVE

## 2019-09-16 LAB — LIPASE, BLOOD: Lipase: 32 U/L (ref 11–51)

## 2019-09-16 MED ORDER — ALUM & MAG HYDROXIDE-SIMETH 200-200-20 MG/5ML PO SUSP: 30.0000 mL | Freq: Once | ORAL | Status: DC

## 2019-09-16 MED ORDER — HEPARIN SODIUM (PORCINE) 5000 UNIT/ML IJ SOLN
5000.0000 [IU] | Freq: Three times a day (TID) | INTRAMUSCULAR | Status: DC
  Administered 2019-09-16 – 2019-09-17 (×2): 5000 [IU] via SUBCUTANEOUS
  Filled 2019-09-16 (×2): qty 1

## 2019-09-16 MED ORDER — OXYCODONE HCL 5 MG PO TABS
5.0000 mg | ORAL_TABLET | Freq: Once | ORAL | Status: AC
  Administered 2019-09-16: 5 mg via ORAL
  Filled 2019-09-16: qty 1

## 2019-09-16 MED ORDER — ALBUTEROL SULFATE (2.5 MG/3ML) 0.083% IN NEBU
3.0000 mL | INHALATION_SOLUTION | Freq: Four times a day (QID) | RESPIRATORY_TRACT | Status: DC | PRN
  Administered 2019-09-20: 3 mL via RESPIRATORY_TRACT
  Filled 2019-09-16: qty 3

## 2019-09-16 MED ORDER — DULOXETINE HCL 60 MG PO CPEP
60.0000 mg | ORAL_CAPSULE | Freq: Every day | ORAL | Status: DC
  Administered 2019-09-16: 60 mg via ORAL
  Filled 2019-09-16: qty 1

## 2019-09-16 MED ORDER — LEVOTHYROXINE SODIUM 50 MCG PO TABS
50.0000 ug | ORAL_TABLET | Freq: Every day | ORAL | Status: DC
  Administered 2019-09-17 – 2019-09-20 (×4): 50 ug via ORAL
  Filled 2019-09-16 (×4): qty 1

## 2019-09-16 MED ORDER — METOPROLOL SUCCINATE ER 50 MG PO TB24
50.0000 mg | ORAL_TABLET | Freq: Two times a day (BID) | ORAL | Status: DC
  Administered 2019-09-16 – 2019-09-20 (×8): 50 mg via ORAL
  Filled 2019-09-16 (×8): qty 1

## 2019-09-16 MED ORDER — ASPIRIN 81 MG PO CHEW
324.0000 mg | CHEWABLE_TABLET | Freq: Once | ORAL | Status: AC
  Administered 2019-09-16: 324 mg via ORAL
  Filled 2019-09-16: qty 4

## 2019-09-16 MED ORDER — HYDRALAZINE HCL 25 MG PO TABS
25.0000 mg | ORAL_TABLET | Freq: Once | ORAL | Status: AC
  Administered 2019-09-16: 25 mg via ORAL
  Filled 2019-09-16: qty 1

## 2019-09-16 MED ORDER — PRAVASTATIN SODIUM 40 MG PO TABS
80.0000 mg | ORAL_TABLET | Freq: Every evening | ORAL | Status: DC
  Administered 2019-09-16 – 2019-09-19 (×4): 80 mg via ORAL
  Filled 2019-09-16 (×4): qty 2

## 2019-09-16 MED ORDER — AMLODIPINE BESYLATE 5 MG PO TABS
5.0000 mg | ORAL_TABLET | Freq: Once | ORAL | Status: AC
  Administered 2019-09-16: 5 mg via ORAL
  Filled 2019-09-16: qty 1

## 2019-09-16 MED ORDER — PANTOPRAZOLE SODIUM 40 MG PO TBEC
40.0000 mg | DELAYED_RELEASE_TABLET | Freq: Every day | ORAL | Status: DC
  Administered 2019-09-16 – 2019-09-20 (×5): 40 mg via ORAL
  Filled 2019-09-16 (×5): qty 1

## 2019-09-16 MED ORDER — SODIUM CHLORIDE 0.9 % IV BOLUS
500.0000 mL | Freq: Once | INTRAVENOUS | Status: AC
  Administered 2019-09-16: 500 mL via INTRAVENOUS

## 2019-09-16 MED ORDER — LISINOPRIL 5 MG PO TABS
5.0000 mg | ORAL_TABLET | Freq: Every day | ORAL | Status: DC
  Administered 2019-09-16 – 2019-09-17 (×2): 5 mg via ORAL
  Filled 2019-09-16 (×2): qty 1

## 2019-09-16 MED ORDER — PAROXETINE HCL 20 MG PO TABS
20.0000 mg | ORAL_TABLET | Freq: Every day | ORAL | Status: DC
  Administered 2019-09-16 – 2019-09-20 (×5): 20 mg via ORAL
  Filled 2019-09-16 (×5): qty 1

## 2019-09-16 MED ORDER — POTASSIUM CHLORIDE CRYS ER 20 MEQ PO TBCR
40.0000 meq | EXTENDED_RELEASE_TABLET | Freq: Once | ORAL | Status: AC
  Administered 2019-09-16: 40 meq via ORAL
  Filled 2019-09-16: qty 2

## 2019-09-16 MED ORDER — ONDANSETRON 4 MG PO TBDP
4.0000 mg | ORAL_TABLET | Freq: Once | ORAL | Status: AC
  Administered 2019-09-16: 4 mg via ORAL
  Filled 2019-09-16: qty 1

## 2019-09-16 MED ORDER — FLUTICASONE FUROATE-VILANTEROL 200-25 MCG/INH IN AEPB
1.0000 | INHALATION_SPRAY | Freq: Every day | RESPIRATORY_TRACT | Status: DC
  Administered 2019-09-17 – 2019-09-20 (×4): 1 via RESPIRATORY_TRACT
  Filled 2019-09-16: qty 28

## 2019-09-16 MED ORDER — HYDRALAZINE HCL 25 MG PO TABS
25.0000 mg | ORAL_TABLET | Freq: Three times a day (TID) | ORAL | Status: DC
  Administered 2019-09-16 – 2019-09-17 (×4): 25 mg via ORAL
  Filled 2019-09-16 (×4): qty 1

## 2019-09-16 MED ORDER — HYDROCODONE-ACETAMINOPHEN 10-325 MG PO TABS
1.0000 | ORAL_TABLET | Freq: Four times a day (QID) | ORAL | Status: DC | PRN
  Administered 2019-09-16 – 2019-09-20 (×11): 1 via ORAL
  Filled 2019-09-16 (×11): qty 1

## 2019-09-16 MED ORDER — GABAPENTIN 300 MG PO CAPS
300.0000 mg | ORAL_CAPSULE | Freq: Three times a day (TID) | ORAL | Status: DC
  Administered 2019-09-16 – 2019-09-20 (×13): 300 mg via ORAL
  Filled 2019-09-16 (×2): qty 1
  Filled 2019-09-16: qty 3
  Filled 2019-09-16 (×10): qty 1

## 2019-09-16 MED ORDER — ALPRAZOLAM 0.5 MG PO TABS
0.5000 mg | ORAL_TABLET | Freq: Two times a day (BID) | ORAL | Status: DC | PRN
  Administered 2019-09-16 – 2019-09-19 (×4): 0.5 mg via ORAL
  Filled 2019-09-16 (×4): qty 1

## 2019-09-16 MED ORDER — FLUTICASONE FUROATE-VILANTEROL 200-25 MCG/INH IN AEPB
1.0000 | INHALATION_SPRAY | Freq: Every day | RESPIRATORY_TRACT | Status: DC
  Filled 2019-09-16: qty 28

## 2019-09-16 MED ORDER — BUSPIRONE HCL 5 MG PO TABS
10.0000 mg | ORAL_TABLET | Freq: Three times a day (TID) | ORAL | Status: DC
  Administered 2019-09-16 – 2019-09-17 (×3): 10 mg via ORAL
  Filled 2019-09-16 (×3): qty 2

## 2019-09-16 NOTE — ED Provider Notes (Addendum)
[facility] Emergency Department Provider Note MRN:  [id number]  Arrival date & time: 09/16/19     Chief Complaint   Fall   History of Present Illness   Kelsey Sandoval is a 85 y.o. year-old female with a history of A. fib, COPD presenting to the ED with chief complaint of fall.  Patient thinks that she lost her balance while she was on her way to the bathroom this evening.  Fell to the floor onto her knees and right shoulder.  Struck her head against the doorway.  Takes Pradaxa.  Denies loss of consciousness.  No vomiting since the fall.  Denies neck or back pain, no chest pain.  Patient is endorsing shortness of breath but explains that this is chronic and unchanged.  No abdominal pain.  No numbness or weakness to the arms or legs.  Review of Systems  A complete 10 system review of systems was obtained and all systems are negative except as noted in the HPI and PMH.   Patient's Health History    Past Medical History:  Diagnosis Date  . Anxiety   . Aortic atherosclerosis ([place]) 10/10/2018  . Arthritis   . Back pain, chronic    Sciatica  . Cataracts, bilateral   . COPD (chronic obstructive pulmonary disease) ([place]) 10/10/2018  . Depression   . Dysrhythmia    AFib  . Essential hypertension   . GERD (gastroesophageal reflux disease)   . Hyperlipidemia   . Hypothyroidism   . Membranous nephropathy determined by biopsy 06/07/2015  . Nephrotic syndrome    RHUX  . Neuropathy   . Osteopenia   . Pacemaker    [employer]  . PAF (paroxysmal atrial fibrillation) ([place])   . Pre-diabetes   . Tachycardia-bradycardia syndrome [facility])     Past Surgical History:  Procedure Laterality Date  . ABDOMINAL HYSTERECTOMY     partial-pt has no ovaries  . ANTERIOR VITRECTOMY Right 03/11/2018   Procedure: ANTERIOR VITRECTOMY;  Surgeon: Baruch Goldmann, MD;  Location: [facility] ORS;  Service: Ophthalmology;  Laterality: Right;  . APPENDECTOMY    . BACK SURGERY    . BIOPSY N/A 01/17/2014     Procedure: BIOPSY;  Surgeon: Rogene Houston, MD;  Location: [facility] ENDO SUITE;  Service: Endoscopy;  Laterality: N/A;  . CATARACT EXTRACTION W/PHACO Left 02/28/2015   Procedure: CATARACT EXTRACTION PHACO AND INTRAOCULAR LENS PLACEMENT (IOC);  Surgeon: [name], MD;  Location: [facility] ORS;  Service: Ophthalmology;  Laterality: Left;  CDE 18.71  . CATARACT EXTRACTION W/PHACO Right 03/11/2018   Procedure: CATARACT EXTRACTION PHACO  AND INTRAOCULAR LENS PLACEMENT RIGHT EYE ;  Surgeon: Baruch Goldmann, MD;  Location: [facility] OR[facility];  Service: Ophthalmology;  Laterality: Right;  CDE: 19.39  . COLONOSCOPY  9/05  . COLONOSCOPY N/A 10/11/2013   Procedure: COLONOSCOPY;  Surgeon: Rogene Houston, MD;  Location: [facility];  Service: Endoscopy;  Laterality: N/A;  1200  . ESOPHAGOGASTRODUODENOSCOPY N/A 01/17/2014   Procedure: ESOPHAGOGASTRODUODENOSCOPY (EGD);  Surgeon: Rogene Houston, MD;  Location: [facility];  Service: Endoscopy;  Laterality: N/A;  230  . ESOPHAGOGASTRODUODENOSCOPY N/A 01/02/2016   Procedure: ESOPHAGOGASTRODUODENOSCOPY (EGD);  Surgeon: Rogene Houston, MD;  Location: [facility];  Service: Endoscopy;  Laterality: N/A;  240  . INSERT / REPLACE / REMOVE PACEMAKER     2012  . Wisdom tooth extracton      Family History  Problem Relation Age of Onset  . Colon cancer Brother   . Anxiety disorder  Sister   . Depression Sister   . Anxiety disorder Sister   . Depression Sister     Social History   Socioeconomic History  . Marital status: Divorced    Spouse name: Not on file  . Number of children: Not on file  . Years of education: Not on file  . Highest education level: Not on file  Occupational History  . Occupation: retired  Tobacco Use  . Smoking status: Never Smoker  . Smokeless tobacco: Never Used  Substance and Sexual Activity  . Alcohol use: No    Alcohol/week: 0.0 standard drinks  . Drug use: No  . Sexual activity: Not Currently    Birth control/protection: None  Other Topics  Concern  . Not on file  Social History Narrative  . Not on file   Social Determinants of Health   Financial Resource Strain:   . Difficulty of Paying Living Expenses: Not on file  Food Insecurity:   . Worried About Charity fundraiser in the Last Year: Not on file  . Ran Out of Food in the Last Year: Not on file  Transportation Needs:   . Lack of Transportation (Medical): Not on file  . Lack of Transportation (Non-Medical): Not on file  Physical Activity:   . Days of Exercise per Week: Not on file  . Minutes of Exercise per Session: Not on file  Stress:   . Feeling of Stress : Not on file  Social Connections:   . Frequency of Communication with Friends and Family: Not on file  . Frequency of Social Gatherings with Friends and Family: Not on file  . Attends Religious Services: Not on file  . Active Member of Clubs or Organizations: Not on file  . Attends Archivist Meetings: Not on file  . Marital Status: Not on file  Intimate Partner Violence:   . Fear of Current or Ex-Partner: Not on file  . Emotionally Abused: Not on file  . Physically Abused: Not on file  . Sexually Abused: Not on file     Physical Exam  Vital Signs and Nursing Notes reviewed Vitals:   09/16/19 0628 09/16/19 0631  BP:  (!) 179/112  Pulse:    Resp: (!) 21   Temp:    SpO2:      CONSTITUTIONAL: Chronically ill-appearing, NAD NEURO:  Alert and oriented x 3, no focal deficits EYES: Anisocoria with right pupil greater than left pupil, normal extraocular movements ENT/NECK:  no LAD, no JVD CARDIO: Regular rate, well-perfused, normal S1 and S2 PULM:  CTAB no wheezing or rhonchi GI/GU:  normal bowel sounds, non-distended, non-tender MSK/SPINE:  No gross deformities, no edema SKIN: Large skin tear to right shoulder, abrasions to bilateral knees with large hematoma to the medial left knee PSYCH:  Appropriate speech and behavior  Diagnostic and Interventional Summary    EKG  Interpretation  Date/Time:  Saturday September 16 2019 05:23:02 EST Ventricular Rate:  83 PR Interval:    QRS Duration: 87 QT Interval:  329 QTC Calculation: 387 R Axis:   53 Text Interpretation: Atrial fibrillation Borderline repolarization abnormality No significant change was found Confirmed by Gerlene Fee [phone]) on 09/16/2019 5:26:54 AM      Labs Reviewed - No data to display  CT Head  Final Result    CT Cervical Spine  Final Result    CT Maxillofacial Wo Contrast  Final Result    DG Knee Complete 4 Views Left    (Results Pending)  DG Knee Complete 4 Views Right    (Results Pending)  DG Pelvis 1-2 Views    (Results Pending)  XR Chest Single View    (Results Pending)  DG Shoulder Right    (Results Pending)    Medications  oxyCODONE (Oxy IR/ROXICODONE) immediate release tablet 5 mg (has no administration in time range)  oxyCODONE (Oxy IR/ROXICODONE) immediate release tablet 5 mg (5 mg Oral Given 09/16/19 0459)     Procedures  /  Critical Care Procedures  ED Course and Medical Decision Making  I have reviewed the triage vital signs and the nursing notes.  Pertinent labs & imaging results that were available during my care of the patient were reviewed by me and considered in my medical decision making (see below for details).     Mechanical fall, anticoagulated, head trauma, will obtain CT imaging to exclude intracranial bleeding, orbital wall fracture, cervical spinal injury.  CT imaging is without significant or emergent process, hematoma evident to the right brow.  Overlying abrasion but no laceration requiring repair.  Multiple skin tears that we will manage with petroleum gauze and Kerlix wrapping.  Awaiting x-ray imaging, anticipating discharge if x-rays are without acute fracture.  7:05 AM update: Awaiting x-ray imaging and ambulatory assessment.  Signed out to oncoming provider at shift change.  Discussed patient's condition with her daughter, who agrees to  keep a close eye on her today.  Family is working on getting her 24-hour assistance at home.  Barth Kirks. Sedonia Small, [place] [email or web address]  Final Clinical Impressions(s) / ED Diagnoses     ICD-10-CM   1. Hematoma of scalp, initial encounter  S00.03XA   2. Fall  W19.XXXA XR Chest Single View    XR Chest Single View  3. Multiple skin tears  T14.8XXA   4. Anticoagulated  Z79.01     ED Discharge Orders    None       Discharge Instructions Discussed with and Provided to Patient:   Discharge Instructions   None       Maudie Flakes, MD 09/16/19 3825    Maudie Flakes, MD 09/16/19 [phone]

## 2019-09-16 NOTE — ED Notes (Signed)
ekg given to dr. Sedonia Small

## 2019-09-16 NOTE — ED Notes (Signed)
Daughter updated regarding admission/observation over night

## 2019-09-16 NOTE — ED Provider Notes (Signed)
Care assumed from Dr. Sedonia Small.  Patient with a fall onto her knees with a head injury on Pradaxa.  Her CT scans are reassuring.  She is awaiting multiple x-rays. Anticipate discharge if results are reassuring and she is able to ambulate.  X-rays are reassuring without acute fracture. Blood pressure is elevated and patient is given her home medications.  We will attempt ambulation.  Patient complained of dizziness and nausea with dry heaving on attempted ambulation.  She received pain medication on empty stomach. We will repeat EKG and obtain basic labs.  EKG shows more pronounced ST depression anteriorly and laterally. ASA given.  Troponin minimally elevated at 39.  Leukocytosis and hypokalemia.   With recurrent falls, elevated troponin and new ST depression on EKG, will plan for observation admission  D/w Dr. Billie Ruddy.    EKG Interpretation  Date/Time:  Saturday September 16 2019 08:34:26 EST Ventricular Rate:  97 PR Interval:    QRS Duration: 89 QT Interval:  330 QTC Calculation: 420 R Axis:   43 Text Interpretation: Atrial fibrillation Nonspecific repol abnormality, diffuse leads anterior and lateral ST depression Confirmed by Ezequiel Essex [phone]) on 09/16/2019 8:37:00 AM         Ezequiel Essex, MD 09/16/19 1747

## 2019-09-16 NOTE — ED Notes (Signed)
[employer] rep has been paged for pacemaker interrogation

## 2019-09-16 NOTE — ED Triage Notes (Signed)
Pt states she was getting up to go to the bathroom when she fell, not sure if she was dizzy or just lost her balance.  Pt has swelling to over right eye, skin tear to right shoulder, and swelling to right knee with small skin tear.

## 2019-09-16 NOTE — H&P (Signed)
History and Physical    Kelsey Sandoval:[id number] DOB: [birth year] DOA: 09/16/2019  PCP: Kathyrn Drown, MD  Patient coming from: home  I have personally briefly reviewed patient's old medical records in [place]  Chief Complaint: fall  HPI: Kelsey Sandoval is a 85 y.o. female with medical history significant of COPD not on home O2, chronic afib on pradaxa, chronic pain on chronic opioids, anxiety and depression, HTN who presented after a fall.   Hx obtained with pt and her daughter on speaker phone.  Pt fell around 3 am when she got up to go to the bathroom.  Pt has been having frequent falls recently, usually occurs first thing in the morning or in the middle of the night.  Pt reported that when she gets upright, she feels something washing over her and gets weak, however, unsure if she ever looses consciousness prior to her falls.  Her PCP is aware of her frequent falls and has ordered HHPT, however, it hasn't started yet.  Pt lives alone with family members checking on her frequently.  She does walk with a walker.  No recent illness.  Pt has chronic back pain and sciatica for which she takes opioids pain meds and gabapentin.  Pt also has chronic anxiety for which she takes Xanax nightly to help her sleep.  Pt has depression for which she takes many duplicate psych meds (daughter said Paxil has helped the most with pts moods).  Pt takes medication for her restless legs.  Pt is also taking many different blood pressure medications with variable blood pressure measurements.  Pt recently asked her PCP for a course of prednisone (7 days) to take just to make her feel better and improve her appetite.  Pt has COPD and chronic dyspnea which improved some after her PCP added some inhalers.  No fever, dyspnea, cough, chest pain, abdominal pain, N/V/D, dysuria.  Of note, daughter mentioned that pt recently had AKI and even suggested herself that pt may have decreased clearance of her medications  due to reduced kidney function (daughter is involved in health care.)   ED Course: initial vitals: afebrile, HR 91, BP 198/122, sating 100% on room air.  Labs notable for WBC 19.1, normal Hgb, potassium 3, Cr 1.47 (around baseline), albumin 2.3, BG 99, trop 39 and flat, EKG showed afib with no ACS-related finding, pan-scan showed Hematoma over the RIGHT orbital rim but no other acute findings.  Pt was admitted for observation.  Assessment/Plan  # Fall at home # Recent recurrent falls # Polypharmacy --Falls happen mostly first thing in the morning or middle of the nights.  Significant risk factors include multiple sedating medications, multiple blood pressure medications that may cause orthostasis, and weakness associated with her age and cormorbidities.  Daughter also mentioned that pt recently had AKI and her kidney function may have declined to cause reduced clearance of her medications. --pan-scan in the ED showed Hematoma over the RIGHT orbital rim but no other acute findings.  PLAN: --Need to address polypharmacy, and dose medications based on her current kidney function. --Monitor on tele  --Orthostatic blood pressure --PT/OT  # Hypokalemia --Replete PRN  # COPD not on home O2, stable --continue home Breo  # Chronic afib on home Pradaxa, rate controlled --continue home metop and Pradaxa  # Chronic MSK pain and sciatica  --continue home gabapentin 300 mg TID for now --continue home Norco 10 q6h PRN for now     # Chronic anxiety  and depression --continue home Paxil (reportedly most helpful) --continue home Buspar for now --d/c home Cymbalta --continue home Xanax 0.5 mg BID PRN for now  # HTN --continue home hydralazine, Lisinopril, metop, torsemide  --d/c amlodipine per PCP due to swelling  # Hypothyroidism --continue home Synthroid  # HLD --continue home statin  # GERD --continue home PPI   DVT prophylaxis: YN:[id number]  Code Status: Full code  Family  Communication: updated daughter on the phone  Disposition Plan: Likely home with HHPT  Admission status: Observation    Review of Systems: As per HPI otherwise 10 point review of systems negative.   Past Medical History:  Diagnosis Date  . Anxiety   . Aortic atherosclerosis ([place]) 10/10/2018  . Arthritis   . Back pain, chronic    Sciatica  . Cataracts, bilateral   . COPD (chronic obstructive pulmonary disease) ([place]) 10/10/2018  . Depression   . Dysrhythmia    AFib  . Essential hypertension   . GERD (gastroesophageal reflux disease)   . Hyperlipidemia   . Hypothyroidism   . Membranous nephropathy determined by biopsy 06/07/2015  . Nephrotic syndrome    RHUX  . Neuropathy   . Osteopenia   . Pacemaker    [employer]  . PAF (paroxysmal atrial fibrillation) ([place])   . Pre-diabetes   . Tachycardia-bradycardia syndrome [facility])     Past Surgical History:  Procedure Laterality Date  . ABDOMINAL HYSTERECTOMY     partial-pt has no ovaries  . ANTERIOR VITRECTOMY Right 03/11/2018   Procedure: ANTERIOR VITRECTOMY;  Surgeon: Baruch Goldmann, MD;  Location: [facility] ORS;  Service: Ophthalmology;  Laterality: Right;  . APPENDECTOMY    . BACK SURGERY    . BIOPSY N/A 01/17/2014   Procedure: BIOPSY;  Surgeon: Rogene Houston, MD;  Location: [facility] END[facility] SUITE;  Service: Endoscopy;  Laterality: N/A;  . CATARACT EXTRACTION W/PHACO Left 02/28/2015   Procedure: CATARACT EXTRACTION PHACO AND INTRAOCULAR LENS PLACEMENT (IOC);  Surgeon: [name], MD;  Location: [facility] ORS;  Service: Ophthalmology;  Laterality: Left;  CDE 18.71  . CATARACT EXTRACTION W/PHACO Right 03/11/2018   Procedure: CATARACT EXTRACTION PHACO  AND INTRAOCULAR LENS PLACEMENT RIGHT EYE ;  Surgeon: Baruch Goldmann, MD;  Location: [facility] OR[facility];  Service: Ophthalmology;  Laterality: Right;  CDE: 19.39  . COLONOSCOPY  9/05  . COLONOSCOPY N/A 10/11/2013   Procedure: COLONOSCOPY;  Surgeon: Rogene Houston, MD;  Location: [facility];  Service: Endoscopy;  Laterality:  N/A;  1200  . ESOPHAGOGASTRODUODENOSCOPY N/A 01/17/2014   Procedure: ESOPHAGOGASTRODUODENOSCOPY (EGD);  Surgeon: Rogene Houston, MD;  Location: [facility];  Service: Endoscopy;  Laterality: N/A;  230  . ESOPHAGOGASTRODUODENOSCOPY N/A 01/02/2016   Procedure: ESOPHAGOGASTRODUODENOSCOPY (EGD);  Surgeon: Rogene Houston, MD;  Location: [facility];  Service: Endoscopy;  Laterality: N/A;  240  . INSERT / REPLACE / REMOVE PACEMAKER     2012  . Wisdom tooth extracton       reports that she has never smoked. She has never used smokeless tobacco. She reports that she does not drink alcohol or use drugs.  Allergies  Allergen Reactions  . Penicillins Other (See Comments)    Caused patient to pass out.Can take cephalosporins Has patient had a PCN reaction causing immediate rash, facial/tongue/throat swelling, SOB or lightheadedness with hypotension: no Has patient had a PCN reaction causing severe rash involving mucus membranes or skin necrosis: No Has patient had a PCN reaction that required hospitalization No Has patient had a PCN reaction  occurring within the last 10 years: No If all of the above answers are "NO", then may proceed with Cephalosporin use.   . Levaquin [Levofloxacin] Nausea Only  . Sulfa Antibiotics Other (See Comments)    Unknown  . Zithromax [Azithromycin] Nausea Only and Rash    Family History  Problem Relation Age of Onset  . Colon cancer Brother   . Anxiety disorder Sister   . Depression Sister   . Anxiety disorder Sister   . Depression Sister     Prior to Admission medications   Medication Sig Start Date End Date Taking? Authorizing Provider  ALPRAZolam Duanne Moron) 0.5 MG tablet Take 1 tablet (0.5 mg total) by mouth 2 (two) times daily as needed for anxiety. Take one qhs 06/02/19  Yes Cloria Spring, MD  budesonide-formoterol [facility]) 160-4.5 MCG/ACT inhaler Inhale 2 puffs into the lungs 2 (two) times daily. 09/04/19  Yes Luking, Elayne Snare, MD  busPIRone (BUSPAR)  10 MG tablet Take 1 tablet (10 mg total) by mouth 3 (three) times daily. 06/02/19  Yes Cloria Spring, MD  DULoxetine (CYMBALTA) 60 MG capsule Take 1 capsule (60 mg total) by mouth daily. 06/02/19  Yes Cloria Spring, MD  gabapentin (NEURONTIN) 300 MG capsule TAKE 1 CAPSULE BY MOUTH THREE TIMES DAILY 03/31/19  Yes Kathyrn Drown, MD  HYDROcodone-acetaminophen (NORCO) 10-325 MG tablet Take one tablet by mouth every 4 hours prn pain. Max 5/day 08/03/19  Yes Kathyrn Drown, MD  hydrOXYzine (ATARAX/VISTARIL) 25 MG tablet Take one tablet every 6 hours prn itching. Caution drowiness 07/19/19  Yes Luking, Elayne Snare, MD  levothyroxine (SYNTHROID) 50 MCG tablet TAKE 1 TABLET BY MOUTH ONCE DAILY BEFORE BREAKFAST 07/25/19  Yes Luking, Scott A, MD  lisinopril (ZESTRIL) 10 MG tablet Take 5 mg by mouth. Takes one half of 10mg    Yes [provider]  metoprolol succinate (TOPROL-XL) 50 MG 24 hr tablet TAKE 1 TABLET BY MOUTH TWICE DAILY.  TAKE WITH OR IMMEDIATELY FOLLOWING A MEAL 06/20/19  Yes Luking, Scott A, MD  ondansetron (ZOFRAN) 8 MG tablet Take 1 tablet (8 mg total) by mouth every 8 (eight) hours as needed for nausea. 09/04/19  Yes Luking, Elayne Snare, MD  pantoprazole (PROTONIX) 40 MG tablet Take 1 tablet (40 mg total) by mouth daily. 08/11/19  Yes Luking, Elayne Snare, MD  PARoxetine (PAXIL) 20 MG tablet Take 1 tablet (20 mg total) by mouth daily. 06/02/19 06/01/20 Yes Cloria Spring, MD  PRADAXA 75 MG CAPS capsule Take 1 capsule by mouth twice daily 04/21/19  Yes Herminio Commons, MD  pravastatin (PRAVACHOL) 80 MG tablet Take 1 tablet (80 mg total) by mouth every evening. 05/01/19  Yes Luking, Elayne Snare, MD  torsemide (DEMADEX) 20 MG tablet TAKE 1 & 1/2 (ONE & ONE-HALF) TABLETS BY MOUTH IN THE MORNING Patient taking differently: Take 40 mg by mouth daily.  08/21/19  Yes Kathyrn Drown, MD  triamcinolone cream (KENALOG) 0.1 % Apply 1 application topically 2 (two) times daily as needed. 01/30/19  Yes Kathyrn Drown, MD  vitamin B-12 (CYANOCOBALAMIN) 1000 MCG tablet Take 1 tablet (1,000 mcg total) by mouth daily. Patient taking differently: Take 1,000 mcg by mouth every morning.  03/11/15  Yes Kathie Dike, MD  HYDROcodone-acetaminophen (NORCO) 10-325 MG tablet Take one tablet by mouth every 4 hours prn pain. Max 5/day. Patient not taking: Reported on 09/16/2019 08/03/19   Kathyrn Drown, MD  PROAIR HFA 108 [phone] Base) MCG/ACT inhaler INHALE  2 PUFFS INTO LUNGS EVERY 6 HOURS AS NEEDED FOR WHEEZING OR SHORTNESS OF BREATH 11/21/18   Kathyrn Drown, MD    Physical Exam: Vitals:   09/16/19 1656 09/16/19 1736 09/16/19 2114 09/16/19 2115  BP: (!) 169/93 (!) 185/101 (!) 150/82   Pulse: 91 81 70   Resp: 16 19 18    Temp: 98 F (36.7 C) 98.3 F (36.8 C) 98.4 F (36.9 C)   TempSrc: Oral Oral Oral   SpO2: 96% 97% 96% 97%  Weight:  71.7 kg    Height:  5\' 9"  (1.753 m)      Vitals:   09/16/19 1656 09/16/19 1736 09/16/19 2114 09/16/19 2115  BP: (!) 169/93 (!) 185/101 (!) 150/82   Pulse: 91 81 70   Resp: 16 19 18    Temp: 98 F (36.7 C) 98.3 F (36.8 C) 98.4 F (36.9 C)   TempSrc: Oral Oral Oral   SpO2: 96% 97% 96% 97%  Weight:  71.7 kg    Height:  5\' 9"  (1.753 m)     Constitutional: NAD, AAOx3 HEENT: ecchymosis and swelling over the right eye, EOMI CV: irregular, no M,R,G. Distal pulses +2.  No cyanosis.   RESP: CTA B/L, normal respiratory effort  GI: +BS, NTND Extremities: No effusions, edema, or tenderness in BLE SKIN: warm, dry.  Extensive bruising all over her body.  Paper thin skin around her lower legs Neuro: II - XII grossly intact.  Sensation intact Psych: Normal mood and affect.  Appropriate judgement and reason    Labs on Admission: I have personally reviewed following labs and imaging studies  CBC: Recent Labs  Lab 09/16/19 0844  WBC 19.1*  NEUTROABS 16.3*  HGB 14.8  HCT 44.9  MCV 95.9  PLT 517   Basic Metabolic Panel: Recent Labs  Lab 09/16/19 0844  NA 139  K 3.0*    CL 97*  CO2 30  GLUCOSE 99  BUN 26*  CREATININE 1.47*  CALCIUM 8.0*   GFR: Estimated Creatinine Clearance: 29.8 mL/min (A) (by C-G formula based on SCr of 1.47 mg/dL (H)). Liver Function Tests: Recent Labs  Lab 09/16/19 0844  AST 32  ALT 26  ALKPHOS 98  BILITOT 1.1  PROT 5.2*  ALBUMIN 2.3*   Recent Labs  Lab 09/16/19 0844  LIPASE 32   No results for input(s): AMMONIA in the last 168 hours. Coagulation Profile: No results for input(s): INR, PROTIME in the last 168 hours. Cardiac Enzymes: No results for input(s): CKTOTAL, CKMB, CKMBINDEX, TROPONINI in the last 168 hours. BNP (last 3 results) No results for input(s): PROBNP in the last 8760 hours. HbA1C: No results for input(s): HGBA1C in the last 72 hours. CBG: No results for input(s): GLUCAP in the last 168 hours. Lipid Profile: No results for input(s): CHOL, HDL, LDLCALC, TRIG, CHOLHDL, LDLDIRECT in the last 72 hours. Thyroid Function Tests: No results for input(s): TSH, T4TOTAL, FREET4, T3FREE, THYROIDAB in the last 72 hours. Anemia Panel: No results for input(s): VITAMINB12, FOLATE, FERRITIN, TIBC, IRON, RETICCTPCT in the last 72 hours. Urine analysis:    Component Value Date/Time   COLORURINE YELLOW 01/20/2017 1130   APPEARANCEUR CLEAR 01/20/2017 1130   LABSPEC 1.015 01/20/2017 1130   PHURINE 6.5 01/20/2017 1130   GLUCOSEU NEGATIVE 01/20/2017 1130   HGBUR TRACE (A) 01/20/2017 1130   BILIRUBINUR NEGATIVE 01/20/2017 1130   BILIRUBINUR 3+ 01/14/2016 1036   KETONESUR NEGATIVE 01/20/2017 1130   PROTEINUR 100 (A) 01/20/2017 1130   UROBILINOGEN negative 01/14/2016 1036  UROBILINOGEN 0.2 03/07/2015 2005   NITRITE NEGATIVE 01/20/2017 1130   LEUKOCYTESUR NEGATIVE 01/20/2017 1130    Radiological Exams on Admission: DG Pelvis 1-2 Views  Result Date: 09/16/2019 CLINICAL DATA:  Per triage note: Pt states she was getting up to go to the bathroom when she fell, not sure if she was dizzy or just lost her balance.  Pt has swelling to over right eye, skin tear to right shoulder, and swelling to right knee with small skin tear. fall EXAM: PELVIS - 1-2 VIEW COMPARISON:  None. FINDINGS: Hips are located. No pelvic fracture or sacral fracture. No evidence of femoral neck fracture on single view. IMPRESSION: 1. No pelvic fracture. Electronically Signed   By: Suzy Bouchard M.D.   On: 09/16/2019 07:35   DG Shoulder Right  Result Date: 09/16/2019 CLINICAL DATA:  Per triage note: Pt states she was getting up to go to the bathroom when she fell, not sure if she was dizzy or just lost her balance. Pt has swelling to over right eye, skin tear to right shoulder, and swelling to right knee with small skin tear. fall EXAM: RIGHT SHOULDER - 2+ VIEW COMPARISON:  None. FINDINGS: Glenohumeral joint is intact. No evidence of scapular fracture or humeral fracture. The acromioclavicular joint is intact. The humerus rides high in the glenohumeral joint suggesting chronic rotator cuff pathology. IMPRESSION: No fracture or dislocation of the right shoulder. Electronically Signed   By: Suzy Bouchard M.D.   On: 09/16/2019 07:36   CT Head  Result Date: 09/16/2019 CLINICAL DATA:  Fall, pradaxa Facial trauma, Right orbital wall swelling/fx Per ed note: Pt states she was getting up to go to the bathroom when she fell, not sure if she was dizzy or just lost her balance. Pt has swelling to over right eye, EXAM: CT HEAD WITHOUT CONTRAST CT MAXILLOFACIAL WITHOUT CONTRAST CT CERVICAL SPINE WITHOUT CONTRAST TECHNIQUE: Multidetector CT imaging of the head, cervical spine, and maxillofacial structures were performed using the standard protocol without intravenous contrast. Multiplanar CT image reconstructions of the cervical spine and maxillofacial structures were also generated. COMPARISON:  None. FINDINGS: CT HEAD FINDINGS Brain: No intracranial hemorrhage. No parenchymal contusion. No midline shift or mass effect. Basilar cisterns are patent. No  skull base fracture. No fluid in the paranasal sinuses or mastoid air cells. Orbits are normal. There are periventricular and subcortical white matter hypodensities. Generalized cortical atrophy. Vascular: No hyperdense vessel or unexpected calcification. Skull: No skull fracture Other: Hematoma over the RIGHT orbital rim measuring 1.6 cm in depth. No associated skull fracture CT MAXILLOFACIAL FINDINGS Osseous: No orbital wall fracture. Maxillary sinus walls are intact. Pterygoid plates are intact. Zygomatic arches intact. The mandibular condyles are located. No mandibular fracture. Orbits: Intraconal contents globes are intact. No proptosis. Sinuses: No fluid sinuses Soft tissues: Unremarkable CT CERVICAL SPINE FINDINGS Alignment: Normal alignment of the cervical vertebral bodies. Skull base and vertebrae: Normal craniocervical junction. No loss of vertebral body height or disc height. Normal facet articulation. No evidence of fracture. Soft tissues and spinal canal: No prevertebral soft tissue swelling. No perispinal or epidural hematoma. Disc levels:  Unremarkable Upper chest: Layering LEFT effusion Other: None IMPRESSION: 1. No intracranial trauma. 2. Hematoma over the RIGHT orbital rim. 3. No facial bone fracture. 4. No cervical spine fracture. 5. Layering LEFT effusion. Electronically Signed   By: Suzy Bouchard M.D.   On: 09/16/2019 06:19   CT Cervical Spine  Result Date: 09/16/2019 CLINICAL DATA:  Fall, pradaxa Facial trauma, Right  orbital wall swelling/fx Per ed note: Pt states she was getting up to go to the bathroom when she fell, not sure if she was dizzy or just lost her balance. Pt has swelling to over right eye, EXAM: CT HEAD WITHOUT CONTRAST CT MAXILLOFACIAL WITHOUT CONTRAST CT CERVICAL SPINE WITHOUT CONTRAST TECHNIQUE: Multidetector CT imaging of the head, cervical spine, and maxillofacial structures were performed using the standard protocol without intravenous contrast. Multiplanar CT image  reconstructions of the cervical spine and maxillofacial structures were also generated. COMPARISON:  None. FINDINGS: CT HEAD FINDINGS Brain: No intracranial hemorrhage. No parenchymal contusion. No midline shift or mass effect. Basilar cisterns are patent. No skull base fracture. No fluid in the paranasal sinuses or mastoid air cells. Orbits are normal. There are periventricular and subcortical white matter hypodensities. Generalized cortical atrophy. Vascular: No hyperdense vessel or unexpected calcification. Skull: No skull fracture Other: Hematoma over the RIGHT orbital rim measuring 1.6 cm in depth. No associated skull fracture CT MAXILLOFACIAL FINDINGS Osseous: No orbital wall fracture. Maxillary sinus walls are intact. Pterygoid plates are intact. Zygomatic arches intact. The mandibular condyles are located. No mandibular fracture. Orbits: Intraconal contents globes are intact. No proptosis. Sinuses: No fluid sinuses Soft tissues: Unremarkable CT CERVICAL SPINE FINDINGS Alignment: Normal alignment of the cervical vertebral bodies. Skull base and vertebrae: Normal craniocervical junction. No loss of vertebral body height or disc height. Normal facet articulation. No evidence of fracture. Soft tissues and spinal canal: No prevertebral soft tissue swelling. No perispinal or epidural hematoma. Disc levels:  Unremarkable Upper chest: Layering LEFT effusion Other: None IMPRESSION: 1. No intracranial trauma. 2. Hematoma over the RIGHT orbital rim. 3. No facial bone fracture. 4. No cervical spine fracture. 5. Layering LEFT effusion. Electronically Signed   By: Suzy Bouchard M.D.   On: 09/16/2019 06:19   XR Chest Single View  Result Date: 09/16/2019 CLINICAL DATA:  Per triage note: Pt states she was getting up to go to the bathroom when she fell, not sure if she was dizzy or just lost her balance. Pt has swelling to over right eye, skin tear to right shoulder, and swelling to right knee with small skin tear.  EXAM: PORTABLE CHEST 1 VIEW COMPARISON:  None. FINDINGS: LEFT-sided pacemaker overlies stable cardiac silhouette. A small LEFT effusion not changed from prior. Otherwise lungs are clear. No fracture or pneumothorax. IMPRESSION: No change in small LEFT effusion.  No evidence of trauma. Electronically Signed   By: Suzy Bouchard M.D.   On: 09/16/2019 07:38   DG Knee Complete 4 Views Left  Result Date: 09/16/2019 CLINICAL DATA:  Per triage note: Pt states she was getting up to go to the bathroom when she fell, not sure if she was dizzy or just lost her balance. Pt has swelling to over right eye, skin tear to right shoulder, and swelling to right knee with small skin tear. fall EXAM: LEFT KNEE - COMPLETE 4+ VIEW COMPARISON:  None. FINDINGS: Severe narrowing of the medial compartment with bone on bone approximation and osteophytosis. Spurring of the superior patella. No joint effusion. No acute fracture dislocation. 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 No acute findings. Electronically Signed   By: Suzy Bouchard M.D.   On: 09/16/2019 07:32   DG Knee Complete 4 Views Right  Result Date: 09/16/2019 CLINICAL DATA:  Per triage note: Pt states she was getting up to go to the bathroom when she fell, not sure if she was dizzy or just lost her  balance. Pt has swelling to over right eye, skin tear to right shoulder, and swelling to right knee with small skin tear. fall EXAM: RIGHT KNEE - COMPLETE 4+ VIEW COMPARISON:  None. FINDINGS: Osteophytosis and joint space narrowing of the medial compartment. Spurring of the patella. Vascular calcifications. No joint effusion. IMPRESSION: 1. No acute findings of the RIGHT knee. 2. Osteoarthritis most severe in the medial compartment. Electronically Signed   By: Suzy Bouchard M.D.   On: 09/16/2019 07:33   CT Maxillofacial Wo Contrast  Result Date: 09/16/2019 CLINICAL DATA:  Fall, pradaxa Facial trauma, Right orbital wall  swelling/fx Per ed note: Pt states she was getting up to go to the bathroom when she fell, not sure if she was dizzy or just lost her balance. Pt has swelling to over right eye, EXAM: CT HEAD WITHOUT CONTRAST CT MAXILLOFACIAL WITHOUT CONTRAST CT CERVICAL SPINE WITHOUT CONTRAST TECHNIQUE: Multidetector CT imaging of the head, cervical spine, and maxillofacial structures were performed using the standard protocol without intravenous contrast. Multiplanar CT image reconstructions of the cervical spine and maxillofacial structures were also generated. COMPARISON:  None. FINDINGS: CT HEAD FINDINGS Brain: No intracranial hemorrhage. No parenchymal contusion. No midline shift or mass effect. Basilar cisterns are patent. No skull base fracture. No fluid in the paranasal sinuses or mastoid air cells. Orbits are normal. There are periventricular and subcortical white matter hypodensities. Generalized cortical atrophy. Vascular: No hyperdense vessel or unexpected calcification. Skull: No skull fracture Other: Hematoma over the RIGHT orbital rim measuring 1.6 cm in depth. No associated skull fracture CT MAXILLOFACIAL FINDINGS Osseous: No orbital wall fracture. Maxillary sinus walls are intact. Pterygoid plates are intact. Zygomatic arches intact. The mandibular condyles are located. No mandibular fracture. Orbits: Intraconal contents globes are intact. No proptosis. Sinuses: No fluid sinuses Soft tissues: Unremarkable CT CERVICAL SPINE FINDINGS Alignment: Normal alignment of the cervical vertebral bodies. Skull base and vertebrae: Normal craniocervical junction. No loss of vertebral body height or disc height. Normal facet articulation. No evidence of fracture. Soft tissues and spinal canal: No prevertebral soft tissue swelling. No perispinal or epidural hematoma. Disc levels:  Unremarkable Upper chest: Layering LEFT effusion Other: None IMPRESSION: 1. No intracranial trauma. 2. Hematoma over the RIGHT orbital rim. 3. No facial  bone fracture. 4. No cervical spine fracture. 5. Layering LEFT effusion. Electronically Signed   By: Suzy Bouchard M.D.   On: 09/16/2019 06:19     Enzo Bi MD [facility]  If 7PM-7AM, please contact night-coverage 09/17/2019, 4:54 AM

## 2019-09-16 NOTE — ED Notes (Signed)
Pt back from x-ray.

## 2019-09-17 DIAGNOSIS — F419 Anxiety disorder, unspecified: Secondary | ICD-10-CM | POA: Diagnosis present

## 2019-09-17 DIAGNOSIS — J449 Chronic obstructive pulmonary disease, unspecified: Secondary | ICD-10-CM | POA: Diagnosis present

## 2019-09-17 DIAGNOSIS — T424X5A Adverse effect of benzodiazepines, initial encounter: Secondary | ICD-10-CM | POA: Diagnosis present

## 2019-09-17 DIAGNOSIS — M109 Gout, unspecified: Secondary | ICD-10-CM | POA: Diagnosis present

## 2019-09-17 DIAGNOSIS — M5430 Sciatica, unspecified side: Secondary | ICD-10-CM | POA: Diagnosis present

## 2019-09-17 DIAGNOSIS — R296 Repeated falls: Secondary | ICD-10-CM | POA: Diagnosis present

## 2019-09-17 DIAGNOSIS — G894 Chronic pain syndrome: Secondary | ICD-10-CM | POA: Diagnosis present

## 2019-09-17 DIAGNOSIS — I129 Hypertensive chronic kidney disease with stage 1 through stage 4 chronic kidney disease, or unspecified chronic kidney disease: Secondary | ICD-10-CM | POA: Diagnosis present

## 2019-09-17 DIAGNOSIS — R7303 Prediabetes: Secondary | ICD-10-CM | POA: Diagnosis present

## 2019-09-17 DIAGNOSIS — Z20828 Contact with and (suspected) exposure to other viral communicable diseases: Secondary | ICD-10-CM | POA: Diagnosis present

## 2019-09-17 DIAGNOSIS — F325 Major depressive disorder, single episode, in full remission: Secondary | ICD-10-CM | POA: Diagnosis present

## 2019-09-17 DIAGNOSIS — I700 Atherosclerosis of aorta: Secondary | ICD-10-CM | POA: Diagnosis present

## 2019-09-17 DIAGNOSIS — I482 Chronic atrial fibrillation, unspecified: Secondary | ICD-10-CM | POA: Diagnosis present

## 2019-09-17 DIAGNOSIS — E039 Hypothyroidism, unspecified: Secondary | ICD-10-CM | POA: Diagnosis present

## 2019-09-17 DIAGNOSIS — S0511XA Contusion of eyeball and orbital tissues, right eye, initial encounter: Secondary | ICD-10-CM | POA: Diagnosis present

## 2019-09-17 DIAGNOSIS — G2581 Restless legs syndrome: Secondary | ICD-10-CM | POA: Diagnosis present

## 2019-09-17 DIAGNOSIS — W1830XA Fall on same level, unspecified, initial encounter: Secondary | ICD-10-CM | POA: Diagnosis present

## 2019-09-17 DIAGNOSIS — E785 Hyperlipidemia, unspecified: Secondary | ICD-10-CM | POA: Diagnosis present

## 2019-09-17 DIAGNOSIS — Y92009 Unspecified place in unspecified non-institutional (private) residence as the place of occurrence of the external cause: Secondary | ICD-10-CM | POA: Diagnosis not present

## 2019-09-17 DIAGNOSIS — Z87441 Personal history of nephrotic syndrome: Secondary | ICD-10-CM | POA: Diagnosis not present

## 2019-09-17 DIAGNOSIS — K219 Gastro-esophageal reflux disease without esophagitis: Secondary | ICD-10-CM | POA: Diagnosis present

## 2019-09-17 DIAGNOSIS — T40605A Adverse effect of unspecified narcotics, initial encounter: Secondary | ICD-10-CM | POA: Diagnosis present

## 2019-09-17 DIAGNOSIS — G629 Polyneuropathy, unspecified: Secondary | ICD-10-CM | POA: Diagnosis present

## 2019-09-17 DIAGNOSIS — N183 Chronic kidney disease, stage 3 unspecified: Secondary | ICD-10-CM | POA: Diagnosis present

## 2019-09-17 DIAGNOSIS — E876 Hypokalemia: Secondary | ICD-10-CM | POA: Diagnosis present

## 2019-09-17 DIAGNOSIS — M8580 Other specified disorders of bone density and structure, unspecified site: Secondary | ICD-10-CM | POA: Diagnosis present

## 2019-09-17 LAB — URINALYSIS, ROUTINE W REFLEX MICROSCOPIC
Bilirubin Urine: NEGATIVE
Glucose, UA: 50 mg/dL — AB
Hgb urine dipstick: NEGATIVE
Ketones, ur: NEGATIVE mg/dL
Leukocytes,Ua: NEGATIVE
Nitrite: NEGATIVE
Protein, ur: >=300 mg/dL — AB
Specific Gravity, Urine: 1.018 (ref 1.005–1.030)
pH: 6 (ref 5.0–8.0)

## 2019-09-17 MED ORDER — DABIGATRAN ETEXILATE MESYLATE 75 MG PO CAPS
75.0000 mg | ORAL_CAPSULE | Freq: Two times a day (BID) | ORAL | Status: DC
  Administered 2019-09-17 – 2019-09-20 (×7): 75 mg via ORAL
  Filled 2019-09-17 (×7): qty 1

## 2019-09-17 MED ORDER — TORSEMIDE 20 MG PO TABS
40.0000 mg | ORAL_TABLET | Freq: Every day | ORAL | Status: DC
  Administered 2019-09-17 – 2019-09-20 (×4): 40 mg via ORAL
  Filled 2019-09-17 (×4): qty 2

## 2019-09-17 MED ORDER — HYDRALAZINE HCL 25 MG PO TABS
50.0000 mg | ORAL_TABLET | Freq: Once | ORAL | Status: AC
  Administered 2019-09-17: 50 mg via ORAL
  Filled 2019-09-17: qty 2

## 2019-09-17 MED ORDER — LISINOPRIL 10 MG PO TABS
10.0000 mg | ORAL_TABLET | Freq: Every day | ORAL | Status: DC
  Administered 2019-09-18 – 2019-09-20 (×3): 10 mg via ORAL
  Filled 2019-09-17 (×3): qty 1

## 2019-09-17 MED ORDER — HYDRALAZINE HCL 25 MG PO TABS
50.0000 mg | ORAL_TABLET | Freq: Three times a day (TID) | ORAL | Status: DC
  Administered 2019-09-17 – 2019-09-20 (×9): 50 mg via ORAL
  Filled 2019-09-17 (×9): qty 2

## 2019-09-17 NOTE — Progress Notes (Signed)
PROGRESS NOTE    Kelsey Sandoval  KYH:[id number] DOB: 1932-12-07 DOA: 09/16/2019 PCP: Kathyrn Drown, MD    Assessment & Plan:   Active Problems:   Falls frequently    Kelsey Sandoval is a 85 y.o. female with medical history significant of COPD not on home O2, chronic afib on pradaxa, chronic pain on chronic opioids, anxiety and depression, HTN who presented after a fall.    # Fall at home # Recent recurrent falls # Polypharmacy --Falls happen mostly first thing in the morning or middle of the nights.  Significant risk factors include multiple sedating medications, multiple blood pressure medications that may cause orthostasis, and weakness associated with her age and cormorbidities.  Daughter also mentioned that pt recently had AKI and her kidney function may have declined to cause reduced clearance of her medications. --pan-scan in the ED showed Hematoma over the RIGHT orbital rim but no other acute findings.  PLAN: --Need to address polypharmacy, and dose medications based on her current kidney function. --Monitor on tele  --Orthostatic blood pressure --PT/OT  # Hypokalemia --Replete PRN  # COPD not on home O2, stable --continue home Breo  # Chronic afib on home Pradaxa, rate controlled --continue home metop and Pradaxa  # Chronic MSK pain and sciatica  --continue home gabapentin 300 mg TID for now --continue home Norco 10 q6h PRN for now     # Chronic anxiety and depression --continue home Paxil (reportedly most helpful) --d/c home Buspar due to poor GFR, per pharmacy --home Cymbalta d/c'ed --continue home Xanax 0.5 mg BID PRN for now  # HTN --Has been high in 180's despite being on all her home BP meds.  --increase home hydralazine to 50 mg TID --increase home Lisinopril to 10 mg daily --continue home metop, torsemide  --amlodipine d/c'ed per PCP due to swelling  # Hypothyroidism --continue home Synthroid  # HLD --continue home statin  #  GERD --continue home PPI  # Left great toe pain and erythema --Considered gout, however pt has no hx of gout.  Pain does not seem severe.  Will monitor for now because pt will likely need steroid if starting gout treatment (and she just recently finished a course of prednisone).   DVT prophylaxis: TD:[id number]  Code Status: Full code  Family Communication: updated daughter on the phone who asked pt to ask for help to get up for bathroom Disposition Plan: Likely home with HHPT  Admission status: Due to pt's high risk of falling, and her living at home alone, pt was not safe to be discharged without proper PT eval and management plan.  Pt was changed to inpatient status today, per Presenter, broadcasting.   Subjective and Interval History:  Pt complained of dysuria, but UA neg for infection, so likely due to irritation of the female external cath.  Pt asked to use the commode, but had declined to get out of bed due to fear of falling.  Pt also complained of pain in her left big toe, which was noted to be red and swollen.  No fever, dyspnea, chest pain, abdominal pain, N/V/D, increased swelling.  PT had not seen her.   Objective: Vitals:   09/17/19 2147 09/18/19 0606 09/18/19 0800 09/18/19 1035  BP: (!) 179/90 (!) 148/86 134/89   Pulse: 87 92 94   Resp: 16 16 18    Temp: 98.6 F (37 C) 97.8 F (36.6 C) 98.7 F (37.1 C)   TempSrc: Oral Oral Oral   SpO2: 99%  95% 95% 93%  Weight:      Height:        Intake/Output Summary (Last 24 hours) at 09/18/2019 1127 Last data filed at 09/17/2019 1700 Gross per 24 hour  Intake 960 ml  Output 1550 ml  Net -590 ml   Filed Weights   09/16/19 0455 09/16/19 1736  Weight: 68 kg 71.7 kg    Examination:   Constitutional: NAD, AAOx3 HEENT: ecchymosis and swelling over the right eye improved, EOMI CV: irregular, no M,R,G. Distal pulses +2.  No cyanosis.  RESP: CTA B/L, normal respiratory effort  GI: +BS, NTND Extremities: No effusions, edema in  BLE.  Both great toes have bunions, however, left one is erythematous, however, not very tender to palpation. SKIN: warm, dry.  Extensive bruising all over her body.  Serious drainage from above her right ankle.  Paper thin skin around her lower legs Neuro: II - XII grossly intact.  Sensation intact Psych: Normal mood and affect.  Appropriate judgement and reason   Data Reviewed: I have personally reviewed following labs and imaging studies  CBC: Recent Labs  Lab 09/16/19 0844  WBC 19.1*  NEUTROABS 16.3*  HGB 14.8  HCT 44.9  MCV 95.9  PLT 161   Basic Metabolic Panel: Recent Labs  Lab 09/16/19 0844  NA 139  K 3.0*  CL 97*  CO2 30  GLUCOSE 99  BUN 26*  CREATININE 1.47*  CALCIUM 8.0*   GFR: Estimated Creatinine Clearance: 29.8 mL/min (A) (by C-G formula based on SCr of 1.47 mg/dL (H)). Liver Function Tests: Recent Labs  Lab 09/16/19 0844  AST 32  ALT 26  ALKPHOS 98  BILITOT 1.1  PROT 5.2*  ALBUMIN 2.3*   Recent Labs  Lab 09/16/19 0844  LIPASE 32   No results for input(s): AMMONIA in the last 168 hours. Coagulation Profile: No results for input(s): INR, PROTIME in the last 168 hours. Cardiac Enzymes: No results for input(s): CKTOTAL, CKMB, CKMBINDEX, TROPONINI in the last 168 hours. BNP (last 3 results) No results for input(s): PROBNP in the last 8760 hours. HbA1C: No results for input(s): HGBA1C in the last 72 hours. CBG: No results for input(s): GLUCAP in the last 168 hours. Lipid Profile: No results for input(s): CHOL, HDL, LDLCALC, TRIG, CHOLHDL, LDLDIRECT in the last 72 hours. Thyroid Function Tests: No results for input(s): TSH, T4TOTAL, FREET4, T3FREE, THYROIDAB in the last 72 hours. Anemia Panel: No results for input(s): VITAMINB12, FOLATE, FERRITIN, TIBC, IRON, RETICCTPCT in the last 72 hours. Sepsis Labs: No results for input(s): PROCALCITON, LATICACIDVEN in the last 168 hours.  Recent Results (from the past 240 hour(s))  SARS CORONAVIRUS 2  (TAT 6-24 HRS) Nasopharyngeal Nasopharyngeal Swab     Status: None   Collection Time: 09/16/19  8:40 AM   Specimen: Nasopharyngeal Swab  Result Value Ref Range Status   SARS Coronavirus 2 NEGATIVE NEGATIVE Final    Comment: (NOTE) SARS-CoV-2 target nucleic acids are NOT DETECTED. The SARS-CoV-2 RNA is generally detectable in upper and lower respiratory specimens during the acute phase of infection. Negative results do not preclude SARS-CoV-2 infection, do not rule out co-infections with other pathogens, and should not be used as the sole basis for treatment or other patient management decisions. Negative results must be combined with clinical observations, patient history, and epidemiological information. The expected result is Negative. Fact Sheet for Patients: SugarRoll.be Fact Sheet for Healthcare Providers: https://www.woods-mathews.com/ This test is not yet approved or cleared by the [place] FDA and  has been authorized for detection and/or diagnosis of SARS-CoV-2 by FDA under an Emergency Use Authorization (EUA). This EUA will remain  in effect (meaning this test can be used) for the duration of the COVID-19 declaration under Section 56 4(b)(1) of the Act, 21 U.S.C. section 360bbb-3(b)(1), unless the authorization is terminated or revoked sooner. Performed at [facility], [place] [address], [place], [place] [address]       Radiology Studies: No results found.   Scheduled Meds: . dabigatran  75 mg Oral BID  . fluticasone furoate-vilanterol  1 puff Inhalation Daily  . gabapentin  300 mg Oral TID  . hydrALAZINE  50 mg Oral Q8H  . levothyroxine  50 mcg Oral Q0600  . lisinopril  10 mg Oral Daily  . metoprolol succinate  50 mg Oral BID  . pantoprazole  40 mg Oral Daily  . PARoxetine  20 mg Oral Daily  . pravastatin  80 mg Oral QPM  . torsemide  40 mg Oral Daily   Continuous Infusions:   LOS: 1 day     Enzo Bi,  MD [facility] If 7PM-7AM, please contact night-coverage 09/18/2019, 11:27 AM

## 2019-09-18 MED ORDER — PREDNISONE 20 MG PO TABS
40.0000 mg | ORAL_TABLET | Freq: Every day | ORAL | Status: DC
  Administered 2019-09-18 – 2019-09-20 (×2): 40 mg via ORAL
  Filled 2019-09-18 (×3): qty 2

## 2019-09-18 NOTE — Plan of Care (Signed)
  Problem: Acute Rehab PT Goals(only PT should resolve) Goal: Pt Will Go Supine/Side To Sit Outcome: Progressing Flowsheets (Taken 09/18/2019 1459) Pt will go Supine/Side to Sit: with supervision Goal: Patient Will Transfer Sit To/From Stand Outcome: Progressing Flowsheets (Taken 09/18/2019 1459) Patient will transfer sit to/from stand:  with min guard assist  with minimal assist Goal: Pt Will Transfer Bed To Chair/Chair To Bed Outcome: Progressing Flowsheets (Taken 09/18/2019 1459) Pt will Transfer Bed to Chair/Chair to Bed:  min guard assist  with min assist Goal: Pt Will Ambulate Outcome: Progressing Flowsheets (Taken 09/18/2019 1459) Pt will Ambulate:  25 feet  with minimal assist  with moderate assist  with rolling walker   2:59 PM, 09/18/19 Lonell Grandchild, MPT Physical Therapist with [facility] [phone] office [phone] mobile phone

## 2019-09-18 NOTE — Progress Notes (Signed)
PROGRESS NOTE    Kelsey Sandoval  FOY:[id number] DOB: Dec 06, 1932 DOA: 09/16/2019 PCP: Kathyrn Drown, MD    Assessment & Plan:   Active Problems:   Falls frequently    Kelsey Sandoval is a 85 y.o. female with medical history significant of COPD not on home O2, chronic afib on pradaxa, chronic pain on chronic opioids, anxiety and depression, HTN who presented after a fall.    # Fall at home # Recent recurrent falls # Polypharmacy --Falls happen mostly first thing in the morning or middle of the nights.  Significant risk factors include multiple sedating medications, multiple blood pressure medications that may cause orthostasis, and weakness associated with her age and cormorbidities.  Daughter also mentioned that pt recently had AKI and her kidney function may have declined to cause reduced clearance of her medications. --pan-scan in the ED showed Hematoma over the RIGHT orbital rim but no other acute findings.  PLAN: --Need to address polypharmacy, and dose medications based on her current kidney function. --Monitor on tele  --Orthostatic blood pressure --PT/OT rec SNF rehab --Nursing to assist pt to be out of bed to toilet.    # Hypokalemia --Replete PRN  # COPD not on home O2, stable --continue home Breo  # Chronic afib on home Pradaxa, rate controlled --continue home metop and Pradaxa  # Chronic MSK pain and sciatica  --continue home gabapentin 300 mg TID for now --continue home Norco 10 q6h PRN for now     # Chronic anxiety and depression --continue home Paxil (reportedly most helpful) --home Buspar d/c'ed due to poor GFR, per pharmacy --home Cymbalta d/c'ed since already on Paxil --continue home Xanax 0.5 mg BID PRN for now  # HTN --Has been high in 180's despite being on all her home BP meds.  --continue hydralazine at increased dose of 50 mg TID --continue home Lisinopril at increased dose of 10 mg daily --continue home metop, torsemide  --amlodipine  d/c'ed per PCP due to swelling  # Hypothyroidism --continue home Synthroid  # HLD --continue home statin  # GERD --continue home PPI  # Left great toe pain and erythema --Considered gout, however pt has no hx of gout.   --Since pain persists, will start treatment for presumed gout.  Due to reduced GFR, will treat with steroid. --Start prednisone 40 mg daily   DVT prophylaxis: [id number]  Code Status: Full code  Family Communication: not today Disposition Plan: SNF rehab Admission status: Due to pt's high risk of falling, and her living at home alone, pt was not safe to be discharged without proper PT eval and management plan.  Pt was changed to inpatient status on 12/13, per utilization manager.   Subjective and Interval History:  PT worked with pt and recommended SNF rehab.  Still had pain in her left great toe while working with PT.  No fever, dyspnea, chest pain, abdominal pain, N/V/D.      Objective: Vitals:   09/18/19 0800 09/18/19 1035 09/18/19 1617 09/18/19 2023  BP: 134/89  (!) 182/98   Pulse: 94  83   Resp: 18  18   Temp: 98.7 F (37.1 C)  97.7 F (36.5 C)   TempSrc: Oral  Oral   SpO2: 95% 93% 100% 100%  Weight:      Height:       No intake or output data in the 24 hours ending 09/18/19 2046 Filed Weights   09/16/19 0455 09/16/19 1736  Weight: 68 kg 71.7 kg  Examination:   Constitutional: NAD, AAOx3 HEENT: ecchymosis and swelling over the right eye improved, EOMI CV: irregular, no M,R,G. Distal pulses +2.  No cyanosis.  RESP: CTA B/L, normal respiratory effort  GI: +BS, NTND Extremities: No effusions, edema in BLE.  Both great toes have bunions, however, left one is erythematous, however, not very tender to palpation. SKIN: warm, dry.  Extensive bruising all over her body.  Serious drainage from above her right ankle.  Paper thin skin around her lower legs Neuro: II - XII grossly intact.  Sensation intact Psych: Depressed and anxious mood  and affect.    Data Reviewed: I have personally reviewed following labs and imaging studies  CBC: Recent Labs  Lab 09/16/19 0844  WBC 19.1*  NEUTROABS 16.3*  HGB 14.8  HCT 44.9  MCV 95.9  PLT 785   Basic Metabolic Panel: Recent Labs  Lab 09/16/19 0844  NA 139  K 3.0*  CL 97*  CO2 30  GLUCOSE 99  BUN 26*  CREATININE 1.47*  CALCIUM 8.0*   GFR: Estimated Creatinine Clearance: 29.8 mL/min (A) (by C-G formula based on SCr of 1.47 mg/dL (H)). Liver Function Tests: Recent Labs  Lab 09/16/19 0844  AST 32  ALT 26  ALKPHOS 98  BILITOT 1.1  PROT 5.2*  ALBUMIN 2.3*   Recent Labs  Lab 09/16/19 0844  LIPASE 32   No results for input(s): AMMONIA in the last 168 hours. Coagulation Profile: No results for input(s): INR, PROTIME in the last 168 hours. Cardiac Enzymes: No results for input(s): CKTOTAL, CKMB, CKMBINDEX, TROPONINI in the last 168 hours. BNP (last 3 results) No results for input(s): PROBNP in the last 8760 hours. HbA1C: No results for input(s): HGBA1C in the last 72 hours. CBG: No results for input(s): GLUCAP in the last 168 hours. Lipid Profile: No results for input(s): CHOL, HDL, LDLCALC, TRIG, CHOLHDL, LDLDIRECT in the last 72 hours. Thyroid Function Tests: No results for input(s): TSH, T4TOTAL, FREET4, T3FREE, THYROIDAB in the last 72 hours. Anemia Panel: No results for input(s): VITAMINB12, FOLATE, FERRITIN, TIBC, IRON, RETICCTPCT in the last 72 hours. Sepsis Labs: No results for input(s): PROCALCITON, LATICACIDVEN in the last 168 hours.  Recent Results (from the past 240 hour(s))  SARS CORONAVIRUS 2 (TAT 6-24 HRS) Nasopharyngeal Nasopharyngeal Swab     Status: None   Collection Time: 09/16/19  8:40 AM   Specimen: Nasopharyngeal Swab  Result Value Ref Range Status   SARS Coronavirus 2 NEGATIVE NEGATIVE Final    Comment: (NOTE) SARS-CoV-2 target nucleic acids are NOT DETECTED. The SARS-CoV-2 RNA is generally detectable in upper and  lower respiratory specimens during the acute phase of infection. Negative results do not preclude SARS-CoV-2 infection, do not rule out co-infections with other pathogens, and should not be used as the sole basis for treatment or other patient management decisions. Negative results must be combined with clinical observations, patient history, and epidemiological information. The expected result is Negative. Fact Sheet for Patients: SugarRoll.be Fact Sheet for Healthcare Providers: https://www.woods-mathews.com/ This test is not yet approved or cleared by the [place] FDA and  has been authorized for detection and/or diagnosis of SARS-CoV-2 by FDA under an Emergency Use Authorization (EUA). This EUA will remain  in effect (meaning this test can be used) for the duration of the COVID-19 declaration under Section 56 4(b)(1) of the Act, 21 U.S.C. section 360bbb-3(b)(1), unless the authorization is terminated or revoked sooner. Performed at [facility], [place] [address] [place], [place]  [place]       Radiology Studies: No results found.   Scheduled Meds: . dabigatran  75 mg Oral BID  . fluticasone furoate-vilanterol  1 puff Inhalation Daily  . gabapentin  300 mg Oral TID  . hydrALAZINE  50 mg Oral Q8H  . levothyroxine  50 mcg Oral Q0600  . lisinopril  10 mg Oral Daily  . metoprolol succinate  50 mg Oral BID  . pantoprazole  40 mg Oral Daily  . PARoxetine  20 mg Oral Daily  . pravastatin  80 mg Oral QPM  . predniSONE  40 mg Oral Q breakfast  . torsemide  40 mg Oral Daily   Continuous Infusions:   LOS: 1 day     Enzo Bi, MD [facility] If 7PM-7AM, please contact night-coverage 09/18/2019, 8:46 PM

## 2019-09-18 NOTE — Plan of Care (Signed)
  Problem: Education: Goal: Knowledge of General Education information will improve Description Including pain rating scale, medication(s)/side effects and non-pharmacologic comfort measures Outcome: Progressing   Problem: Health Behavior/Discharge Planning: Goal: Ability to manage health-related needs will improve Outcome: Progressing   

## 2019-09-18 NOTE — TOC Initial Note (Addendum)
Transition of Care [facility]) - Initial/Assessment Note    Patient Details  Name: Kelsey Sandoval MRN: [id number] Date of Birth: Mar 21, 1933  [facility] CM/SW Contact:    Brezlyn Manrique, Chauncey Reading, RN Phone Number: 09/18/2019, 1:58 PM  Clinical Narrative:  Here for falls, ?polypharmacy contributing. From home alone, has RW. Has PCP, family takes to appointments. No oxygen at home.  Recommended for SNF. Discussed with patient, she is agreeable. Will send out referrals when PT note available and discuss bed offers with patient. PASSR pending, anticipate a level II passr.  Call to daughter, no answer.         Expected Discharge Plan: Skilled Nursing Facility Barriers to Discharge: Continued Medical Work up, SNF Pending bed offer   Patient Goals and CMS Choice Patient states their goals for this hospitalization and ongoing recovery are:: GO TO [place] CMS Medicare.gov Compare Post Acute Care list provided to:: Patient Choice offered to / list presented to : Patient  Expected Discharge Plan and Services Expected Discharge Plan: [place]   Discharge Planning Services: CM Consult Post Acute Care Choice: Nursing Home Living arrangements for the past 2 months: Single Family Home                   Prior Living Arrangements/Services Living arrangements for the past 2 months: Single Family Home Lives with:: Self Patient language and need for interpreter reviewed:: Yes Do you feel safe going back to the place where you live?: Yes      Need for Family Participation in Patient Care: Yes (Comment) Care giver support system in place?: Yes (comment) Current home services: DME(RW) Criminal Activity/Legal Involvement Pertinent to Current Situation/Hospitalization: No - Comment as needed  Activities of Daily Living Home Assistive Devices/Equipment: Environmental consultant (specify type), Cane (specify quad or straight), Blood pressure cuff ADL Screening (condition at time  of admission) Patient's cognitive ability adequate to safely complete daily activities?: Yes Is the patient deaf or have difficulty hearing?: No Does the patient have difficulty seeing, even when wearing glasses/contacts?: No Does the patient have difficulty concentrating, remembering, or making decisions?: No Patient able to express need for assistance with ADLs?: Yes Does the patient have difficulty dressing or bathing?: No Independently performs ADLs?: Yes (appropriate for developmental age) Does the patient have difficulty walking or climbing stairs?: No Weakness of Legs: Both Weakness of Arms/Hands: None  Permission Sought/Granted Permission sought to share information with : Facility Art therapist granted to share information with : Yes, Verbal Permission Granted              Emotional Assessment   Attitude/Demeanor/Rapport: Engaged Affect (typically observed): Accepting, Appropriate Orientation: : Oriented to Self, Oriented to Place, Oriented to  Time      Admission diagnosis:  Falls frequently [R29.6] Fall [W19.XXXA] Anticoagulated [Z79.01] Multiple skin tears [T14.8XXA] Hematoma of scalp, initial encounter [S00.03XA] Patient Active Problem List   Diagnosis Date Noted  . Falls frequently 09/16/2019  . Aortic atherosclerosis ([place]) 10/10/2018  . COPD (chronic obstructive pulmonary disease) ([place]) 10/10/2018  . Hypertensive urgency 07/31/2018  . Atrial fibrillation with RVR ([place]) 01/20/2017  . Nausea 01/20/2017  . Anxiety 01/20/2017  . Acute bronchitis 09/11/2016  . Chronic anticoagulation 09/07/2016  . Chronic kidney disease (CKD), stage III (moderate) 09/07/2016  . Hereditary and idiopathic peripheral neuropathy 03/05/2016  . Major depression 12/04/2015  . Major depression in remission ([place]) 09/02/2015  . Membranous nephropathy determined by biopsy 06/07/2015  . Osteoarthritis of  both knees 03/26/2015  . Chronic pain syndrome 03/26/2015  .  Intractable nausea and vomiting   . Nausea with vomiting   . Malnutrition of moderate degree ([place]) 03/08/2015  . UTI (lower urinary tract infection) 03/07/2015  . Hypothyroidism 02/14/2015  . Disorder of kidney 01/17/2015  . Nephrotic syndrome 06/21/2014  . Proteinuria 03/29/2014  . Osteopenia 03/29/2014  . Anemia, iron deficiency 08/22/2013  . Mitral insufficiency 06/11/2013  . Pacemaker 06/11/2013  . Paroxysmal atrial fibrillation ([place]) 09/26/2012  . Tachycardia-bradycardia syndrome ([place]) 09/26/2012  . Sick sinus syndrome ([place]) 09/26/2012  . Systemic hypertension 09/26/2012  . Dyslipidemia 09/26/2012   PCP:  Kathyrn Drown, MD Pharmacy:   [place], [place] - [place] [address] [place] [address] [place] [address] Phone: [phone] Fax: [phone]  [place], [place] [address] Phone: [phone] Fax: [phone]     Social Determinants of Health (SDOH) Interventions    Readmission Risk Interventions No flowsheet data found.

## 2019-09-18 NOTE — NC FL2 (Signed)
[place] LEVEL OF CARE SCREENING TOOL     IDENTIFICATION  Patient Name: Kelsey Sandoval Birthdate: [birth year] Sex: female Admission Date (Current Location): 09/16/2019  [facility] and [place] Number:  [employer] and Address:  [place] [address], [place]      Provider Number: [id number]  Attending Physician Name and Address:  Enzo Bi, MD  Relative Name and Phone Number:       Current Level of Care: Hospital Recommended Level of Care: [place] Prior Approval Number:    Date Approved/Denied:   PASRR Number: [id number]  Discharge Plan: Home    Current Diagnoses: Patient Active Problem List   Diagnosis Date Noted  . Falls frequently 09/16/2019  . Aortic atherosclerosis ([place]) 10/10/2018  . COPD (chronic obstructive pulmonary disease) ([place]) 10/10/2018  . Hypertensive urgency 07/31/2018  . Atrial fibrillation with RVR ([place]) 01/20/2017  . Nausea 01/20/2017  . Anxiety 01/20/2017  . Acute bronchitis 09/11/2016  . Chronic anticoagulation 09/07/2016  . Chronic kidney disease (CKD), stage III (moderate) 09/07/2016  . Hereditary and idiopathic peripheral neuropathy 03/05/2016  . Major depression 12/04/2015  . Major depression in remission ([place]) 09/02/2015  . Membranous nephropathy determined by biopsy 06/07/2015  . Osteoarthritis of both knees 03/26/2015  . Chronic pain syndrome 03/26/2015  . Intractable nausea and vomiting   . Nausea with vomiting   . Malnutrition of moderate degree ([place]) 03/08/2015  . UTI (lower urinary tract infection) 03/07/2015  . Hypothyroidism 02/14/2015  . Disorder of kidney 01/17/2015  . Nephrotic syndrome 06/21/2014  . Proteinuria 03/29/2014  . Osteopenia 03/29/2014  . Anemia, iron deficiency 08/22/2013  . Mitral insufficiency 06/11/2013  . Pacemaker 06/11/2013  . Paroxysmal atrial fibrillation ([place]) 09/26/2012  . Tachycardia-bradycardia syndrome ([place]) 09/26/2012  .  Sick sinus syndrome ([place]) 09/26/2012  . Systemic hypertension 09/26/2012  . Dyslipidemia 09/26/2012    Orientation RESPIRATION BLADDER Height & Weight     Self, Time, Situation  O2(see DC summary) Continent Weight: 71.7 kg Height:  5\' 9"  (175.3 cm)  BEHAVIORAL SYMPTOMS/MOOD NEUROLOGICAL BOWEL NUTRITION STATUS      Continent Diet(See DC summary)  AMBULATORY STATUS COMMUNICATION OF NEEDS Skin   Extensive Assist Verbally Normal                       Personal Care Assistance Level of Assistance  Bathing, Dressing, Feeding Bathing Assistance: Limited assistance Feeding assistance: Independent Dressing Assistance: Limited assistance     Functional Limitations Info  Sight, Hearing, Speech Sight Info: Adequate Hearing Info: Adequate Speech Info: Adequate    SPECIAL CARE FACTORS FREQUENCY  PT (By licensed PT)     PT Frequency: 5x/week              Contractures Contractures Info: Not present    Additional Factors Info  Code Status, Allergies, Psychotropic Code Status Info: Full code Allergies Info: PCN, levaquin, sulfa, zithromax Psychotropic Info: xanax, cymbalta, neurontin, paxil         Current Medications (09/18/2019):  This is the current hospital active medication list Current Facility-Administered Medications  Medication Dose Route Frequency Provider Last Rate Last Admin  . albuterol (PROVENTIL) (2.5 MG/3ML) 0.083% nebulizer solution 3 mL  3 mL Inhalation Q6H PRN Enzo Bi, MD      . ALPRAZolam Duanne Moron) tablet 0.5 mg  0.5 mg Oral BID PRN Enzo Bi, MD   0.5 mg at 09/18/19 5643  . dabigatran (PRADAXA) capsule 75 mg  75 mg Oral BID Enzo Bi, MD   75 mg at 09/18/19 0957  . fluticasone furoate-vilanterol (BREO ELLIPTA) 200-25 MCG/INH 1 puff  1 puff Inhalation Daily Enzo Bi, MD   1 puff at 09/18/19 1031  . gabapentin (NEURONTIN) capsule 300 mg  300 mg Oral TID Enzo Bi, MD   300 mg at 09/18/19 0957  . hydrALAZINE (APRESOLINE) tablet 50 mg  50 mg Oral Q8H  Enzo Bi, MD   50 mg at 09/18/19 8372  . HYDROcodone-acetaminophen (NORCO) 10-325 MG per tablet 1 tablet  1 tablet Oral Q6H PRN Enzo Bi, MD   1 tablet at 09/18/19 [phone]  . levothyroxine (SYNTHROID) tablet 50 mcg  50 mcg Oral Q0600 Enzo Bi, MD   50 mcg at 09/18/19 [phone]  . lisinopril (ZESTRIL) tablet 10 mg  10 mg Oral Daily Enzo Bi, MD   10 mg at 09/18/19 0956  . metoprolol succinate (TOPROL-XL) 24 hr tablet 50 mg  50 mg Oral BID Enzo Bi, MD   50 mg at 09/18/19 0955  . pantoprazole (PROTONIX) EC tablet 40 mg  40 mg Oral Daily Enzo Bi, MD   40 mg at 09/18/19 0955  . PARoxetine (PAXIL) tablet 20 mg  20 mg Oral Daily Enzo Bi, MD   20 mg at 09/18/19 0956  . pravastatin (PRAVACHOL) tablet 80 mg  80 mg Oral QPM Enzo Bi, MD   80 mg at 09/17/19 1708  . torsemide (DEMADEX) tablet 40 mg  40 mg Oral Daily Enzo Bi, MD   40 mg at 09/18/19 2080     Discharge Medications: Please see discharge summary for a list of discharge medications.  Relevant Imaging Results:  Relevant Lab Results:   Additional Information SSN [id number]  Javis Abboud, Chauncey Reading, RN

## 2019-09-18 NOTE — Evaluation (Signed)
Physical Therapy Evaluation Patient Details Name: Kelsey Sandoval MRN: [id number] DOB: 22-Jul-1933 Today's Date: 09/18/2019   History of Present Illness  Kelsey Sandoval is a 85 y.o. female with medical history significant of COPD not on home O2, chronic afib on pradaxa, chronic pain on chronic opioids, anxiety and depression, HTN who presented after a fall.    Clinical Impression  Patient limited to a few steps at bedside due to c/o severe pain in left great toe with redness noted, very unsteady and at high risk for falls.  Patient tolerated sitting up in chair after therapy - nursing staff notified.  Patient will benefit from continued physical therapy in hospital and recommended venue below to increase strength, balance, endurance for safe ADLs and gait.    Follow Up Recommendations SNF    Equipment Recommendations  None recommended by PT    Recommendations for Other Services       Precautions / Restrictions Precautions Precautions: Fall Restrictions Weight Bearing Restrictions: No      Mobility  Bed Mobility Overal bed mobility: Needs Assistance Bed Mobility: Supine to Sit     Supine to sit: Min guard;Min assist     General bed mobility comments: increased time, labored movement  Transfers Overall transfer level: Needs assistance Equipment used: Rolling walker (2 wheeled) Transfers: Sit to/from [employer] Sit to Stand: Min assist;Mod assist Stand pivot transfers: Mod assist       General transfer comment: limited mostly due to left foot pain  Ambulation/Gait Ambulation/Gait assistance: Mod assist Gait Distance (Feet): 5 Feet Assistive device: Rolling walker (2 wheeled) Gait Pattern/deviations: Decreased step length - right;Decreased step length - left;Decreased stance time - left;Decreased stride length;Antalgic Gait velocity: slow   General Gait Details: slow labored steps with limited weightbearing on LLE due to increased  foot/toe  pain  Stairs            Wheelchair Mobility    Modified Rankin (Stroke Patients Only)       Balance Overall balance assessment: Needs assistance Sitting-balance support: Feet supported;No upper extremity supported Sitting balance-Leahy Scale: Fair Sitting balance - Comments: fair/good seated at bedside   Standing balance support: During functional activity;Bilateral upper extremity supported Standing balance-Leahy Scale: Poor Standing balance comment: fair/poor using RW                             Pertinent Vitals/Pain Pain Assessment: Faces Faces Pain Scale: Hurts even more Pain Location: left foot medial side of great toe possibly due to gout per patient Pain Descriptors / Indicators: Grimacing;Guarding;Sharp;Sore Pain Intervention(s): Limited activity within patient's tolerance;Monitored during session    [place] expects to be discharged to:: Private residence Living Arrangements: Alone Available Help at Discharge: Family;Available PRN/intermittently Type of Home: House Home Access: Ramped entrance     Home Layout: One level Home Equipment: Walker - 2 wheels;Bedside commode;Shower seat;Cane - single point      Prior Function Level of Independence: Independent with assistive device(s)         Comments: household ambulator with RW     Hand Dominance        Extremity/Trunk Assessment   Upper Extremity Assessment Upper Extremity Assessment: Generalized weakness    Lower Extremity Assessment Lower Extremity Assessment: Generalized weakness    Cervical / Trunk Assessment Cervical / Trunk Assessment: Normal  Communication   Communication: No difficulties  Cognition Arousal/Alertness: Awake/alert Behavior During Therapy: WFL for tasks assessed/performed  Overall Cognitive Status: Within Functional Limits for tasks assessed                                        General Comments      Exercises      Assessment/Plan    PT Assessment Patient needs continued PT services  PT Problem List Decreased strength;Decreased activity tolerance;Decreased balance;Decreased mobility       PT Treatment Interventions Gait training;Stair training;Functional mobility training;Therapeutic activities;Therapeutic exercise;Patient/family education    PT Goals (Current goals can be found in the Care Plan section)  Acute Rehab PT Goals Patient Stated Goal: return home after rehab PT Goal Formulation: With patient Time For Goal Achievement: 10/02/19 Potential to Achieve Goals: Good    Frequency Min 3X/week   Barriers to discharge        Co-evaluation               AM-PAC PT "6 Clicks" Mobility  Outcome Measure Help needed turning from your back to your side while in a flat bed without using bedrails?: A Little Help needed moving from lying on your back to sitting on the side of a flat bed without using bedrails?: A Little Help needed moving to and from a bed to a chair (including a wheelchair)?: A Lot Help needed standing up from a chair using your arms (e.g., wheelchair or bedside chair)?: A Lot Help needed to walk in hospital room?: A Lot Help needed climbing 3-5 steps with a railing? : Total 6 Click Score: 13    End of Session   Activity Tolerance: Patient limited by lethargy;Patient tolerated treatment well;Patient limited by pain Patient left: in chair;with call bell/phone within reach Nurse Communication: Mobility status PT Visit Diagnosis: Unsteadiness on feet (R26.81);Other abnormalities of gait and mobility (R26.89);Muscle weakness (generalized) (M62.81);Pain Pain - Right/Left: Left Pain - part of body: Ankle and joints of foot    Time: 1019-1046 PT Time Calculation (min) (ACUTE ONLY): 27 min   Charges:   PT Evaluation $PT Eval Moderate Complexity: 1 Mod PT Treatments $Therapeutic Activity: 23-37 mins        2:57 PM, 09/18/19 Lonell Grandchild, MPT Physical  Therapist with [facility] [phone] office [phone] mobile phone

## 2019-09-19 ENCOUNTER — Telehealth: Payer: Self-pay | Admitting: Family Medicine

## 2019-09-19 ENCOUNTER — Encounter: Payer: PPO | Admitting: Internal Medicine

## 2019-09-19 LAB — BASIC METABOLIC PANEL
Anion gap: 9 (ref 5–15)
BUN: 29 mg/dL — ABNORMAL HIGH (ref 8–23)
CO2: 31 mmol/L (ref 22–32)
Calcium: 8.2 mg/dL — ABNORMAL LOW (ref 8.9–10.3)
Chloride: 97 mmol/L — ABNORMAL LOW (ref 98–111)
Creatinine, Ser: 1.55 mg/dL — ABNORMAL HIGH (ref 0.44–1.00)
GFR calc Af Amer: 35 mL/min — ABNORMAL LOW (ref >60)
GFR calc non Af Amer: 30 mL/min — ABNORMAL LOW (ref >60)
Glucose, Bld: 122 mg/dL — ABNORMAL HIGH (ref 70–99)
Potassium: 4 mmol/L (ref 3.5–5.1)
Sodium: 137 mmol/L (ref 135–145)

## 2019-09-19 LAB — CBC
HCT: 43 % (ref 36.0–46.0)
Hemoglobin: 14 g/dL (ref 12.0–15.0)
MCH: 31.7 pg (ref 26.0–34.0)
MCHC: 32.6 g/dL (ref 30.0–36.0)
MCV: 97.3 fL (ref 80.0–100.0)
Platelets: 233 10*3/uL (ref 150–400)
RBC: 4.42 MIL/uL (ref 3.87–5.11)
RDW: 14.8 % (ref 11.5–15.5)
WBC: 12.4 10*3/uL — ABNORMAL HIGH (ref 4.0–10.5)
nRBC: 0 % (ref 0.0–0.2)

## 2019-09-19 LAB — URINE CULTURE: Culture: <10000 — AB

## 2019-09-19 LAB — MAGNESIUM: Magnesium: 1.9 mg/dL (ref 1.7–2.4)

## 2019-09-19 NOTE — Progress Notes (Signed)
Physical Therapy Treatment Patient Details Name: Kelsey Sandoval MRN: [id number] DOB: 08/17/1933 Today's Date: 09/19/2019    History of Present Illness Kelsey Sandoval is a 85 y.o. female with medical history significant of COPD not on home O2, chronic afib on pradaxa, chronic pain on chronic opioids, anxiety and depression, HTN who presented after a fall.    PT Comments    Patient presents seated in chair (assisted by nursing staff) and  demonstrates increased endurance/distance for ambulation with less c/o pain left great toe, no loss of balance, but limited due to fatigue and mild SOB.  Patient demonstrates good return for completing BLE ROM/strengthening exercises while seated in chair and tolerated staying up in chair after therapy.  Patient will benefit from continued physical therapy in hospital and recommended venue below to increase strength, balance, endurance for safe ADLs and gait.   Follow Up Recommendations  SNF;Supervision - Intermittent;Supervision for mobility/OOB     Equipment Recommendations  None recommended by PT    Recommendations for Other Services       Precautions / Restrictions Precautions Precautions: Fall Restrictions Weight Bearing Restrictions: No    Mobility  Bed Mobility               General bed mobility comments: Patient presents seated in chair (assisted by nursing staff)  Transfers Overall transfer level: Needs assistance Equipment used: Rolling walker (2 wheeled) Transfers: Sit to/from [employer] Transfers Sit to Stand: Min assist;Mod assist Stand pivot transfers: Min assist       General transfer comment: requires verbal cues for proper hand placement during sit to stands with fair/good carryover  Ambulation/Gait Ambulation/Gait assistance: Min assist Gait Distance (Feet): 40 Feet Assistive device: Rolling walker (2 wheeled) Gait Pattern/deviations: Decreased step length - right;Decreased step length - left;Decreased  stance time - left;Decreased stride length;Antalgic Gait velocity: slow   General Gait Details: increased endurance/distance for ambulation with less c/o pain in left great toe, demonstrating slow labored cadence without loss of balance   Stairs             Wheelchair Mobility    Modified Rankin (Stroke Patients Only)       Balance Overall balance assessment: Needs assistance Sitting-balance support: Feet supported;No upper extremity supported Sitting balance-Leahy Scale: Fair Sitting balance - Comments: fair/good seated at bedside   Standing balance support: During functional activity;Bilateral upper extremity supported Standing balance-Leahy Scale: Fair Standing balance comment: using RW                            Cognition Arousal/Alertness: Awake/alert Behavior During Therapy: WFL for tasks assessed/performed Overall Cognitive Status: Within Functional Limits for tasks assessed                                        Exercises General Exercises - Lower Extremity Long Arc Quad: Seated;AROM;Strengthening;Both;10 reps Hip Flexion/Marching: Seated;AROM;Strengthening;Both;10 reps Toe Raises: Seated;AROM;Strengthening;Both;10 reps Heel Raises: Seated;AROM;Strengthening;Both;10 reps    General Comments        Pertinent Vitals/Pain Pain Assessment: Faces Faces Pain Scale: Hurts little more Pain Location: left foot medial side of great toe possibly due to gout per patient Pain Descriptors / Indicators: Guarding;Sore Pain Intervention(s): Limited activity within patient's tolerance;Monitored during session    Home Living  Prior Function            PT Goals (current goals can now be found in the care plan section) Acute Rehab PT Goals Patient Stated Goal: return home after rehab PT Goal Formulation: With patient Time For Goal Achievement: 10/02/19 Potential to Achieve Goals: Good Progress towards PT  goals: Progressing toward goals    Frequency    Min 3X/week      PT Plan Current plan remains appropriate    Co-evaluation              AM-PAC PT "6 Clicks" Mobility   Outcome Measure  Help needed turning from your back to your side while in a flat bed without using bedrails?: A Little Help needed moving from lying on your back to sitting on the side of a flat bed without using bedrails?: A Little Help needed moving to and from a bed to a chair (including a wheelchair)?: A Little Help needed standing up from a chair using your arms (e.g., wheelchair or bedside chair)?: A Lot Help needed to walk in hospital room?: A Little Help needed climbing 3-5 steps with a railing? : A Lot 6 Click Score: 16    End of Session Equipment Utilized During Treatment: Gait belt Activity Tolerance: Patient tolerated treatment well;Patient limited by fatigue Patient left: in chair;with call bell/phone within reach Nurse Communication: Mobility status PT Visit Diagnosis: Unsteadiness on feet (R26.81);Other abnormalities of gait and mobility (R26.89);Muscle weakness (generalized) (M62.81);Pain Pain - Right/Left: Left Pain - part of body: Ankle and joints of foot     Time: 1510-1538 PT Time Calculation (min) (ACUTE ONLY): 28 min  Charges:  $Gait Training: 8-22 mins $Therapeutic Exercise: 8-22 mins                     3:56 PM, 09/19/19 Lonell Grandchild, MPT Physical Therapist with [facility] [phone] office [phone] mobile phone

## 2019-09-19 NOTE — Progress Notes (Signed)
PROGRESS NOTE    Kelsey Sandoval  DJS:[id number] DOB: [birth year] DOA: 09/16/2019 PCP: Kathyrn Drown, MD    Assessment & Plan:   Active Problems:   Falls frequently    Kelsey Sandoval is a 85 y.o. female with medical history significant of COPD not on home O2, chronic afib on pradaxa, chronic pain on chronic opioids, anxiety and depression, HTN who presented after a fall.    # Fall at home # Recent recurrent falls # Polypharmacy --Falls happen mostly first thing in the morning or middle of the nights.  Significant risk factors include multiple sedating medications, multiple blood pressure medications that may cause orthostasis, and weakness associated with her age and cormorbidities.  Daughter also mentioned that pt recently had AKI and her kidney function may have declined to cause reduced clearance of her medications. --pan-scan in the ED showed Hematoma over the RIGHT orbital rim but no other acute findings.  PLAN: --Need to address polypharmacy, and dose medications based on her current kidney function. --d/c tele  --PT/OT  --Nursing to assist pt to be out of bed to toilet.    # Hypokalemia --Replete PRN  # COPD not on home O2, stable --continue home Breo  # Chronic afib on home Pradaxa, rate controlled --continue home metop and Pradaxa  # Chronic MSK pain and sciatica  --continue home gabapentin 300 mg TID for now --continue home Norco 10 q6h PRN for now     # Chronic anxiety and depression --continue home Paxil (reportedly most helpful) --home Buspar d/c'ed due to poor GFR, per pharmacy --home Cymbalta d/c'ed since already on Paxil --continue home Xanax 0.5 mg BID PRN for now  # HTN --Has been high in 180's despite being on all her home BP meds.  --continue hydralazine at increased dose of 50 mg TID --continue home Lisinopril at increased dose of 10 mg daily --continue home metop, torsemide  --amlodipine d/c'ed per PCP due to swelling  #  Hypothyroidism --continue home Synthroid  # HLD --continue home statin  # GERD --continue home PPI  # Left great toe pain and erythema --Considered gout, however pt has no hx of gout.   --Since pain persists, started tx for presumed gout.  Due to reduced GFR, avoid Colchicine and NSAIDs --continue prednisone 40 mg daily until toe pain resolves, then need to be tapered down   DVT prophylaxis: On Pradaxa  Code Status: Full code  Family Communication: Daughter updated at bedside Disposition Plan: SNF rehab Admission status: Due to pt's high risk of falling, and her living at home alone, pt was not safe to be discharged without proper PT eval and management plan.  Pt was changed to inpatient status on 12/13, per utilization manager.   Subjective and Interval History:  Pt reported feeling better, and nursing has been helping pt to get up to go to the bathroom.  Prednisone started yesterday for presumed gout, left great toe pain improved some, per pt.  Pt has not requested any Xanax or Norco today.  No fever, dyspnea, chest pain, abdominal pain, N/V/D, dysuria.   Objective: Vitals:   09/19/19 0556 09/19/19 0759 09/19/19 1248 09/19/19 2013  BP: 134/85  (!) 152/91 (!) 151/72  Pulse: 88  88 94  Resp: 18  18   Temp: 98 F (36.7 C)  98.5 F (36.9 C)   TempSrc: Oral     SpO2: 93% 94% 90%   Weight:      Height:  Intake/Output Summary (Last 24 hours) at 09/19/2019 2209 Last data filed at 09/19/2019 1700 Gross per 24 hour  Intake 720 ml  Output --  Net 720 ml   Filed Weights   09/16/19 0455 09/16/19 1736  Weight: 68 kg 71.7 kg    Examination:   Constitutional: NAD, AAOx3, more calm and brighter today HEENT: ecchymosis and swelling over the right eye improved, EOMI CV: irregular, no M,R,G. Distal pulses +2.  No cyanosis.  RESP: CTA B/L, normal respiratory effort  GI: +BS, NTND Extremities: No effusions, edema in BLE.  Both great toes have bunions, however, left  one is erythematous, however, not very tender to palpation. SKIN: warm, dry.  Extensive bruising all over her body.  Serious drainage from above her right ankle.  Paper thin skin around her lower legs Neuro: II - XII grossly intact.  Sensation intact Psych: better mood and affect today   Data Reviewed: I have personally reviewed following labs and imaging studies  CBC: Recent Labs  Lab 09/16/19 0844 09/19/19 0832  WBC 19.1* 12.4*  NEUTROABS 16.3*  --   HGB 14.8 14.0  HCT 44.9 43.0  MCV 95.9 97.3  PLT 252 034   Basic Metabolic Panel: Recent Labs  Lab 09/16/19 0844 09/19/19 0832  NA 139 137  K 3.0* 4.0  CL 97* 97*  CO2 30 31  GLUCOSE 99 122*  BUN 26* 29*  CREATININE 1.47* 1.55*  CALCIUM 8.0* 8.2*  MG  --  1.9   GFR: Estimated Creatinine Clearance: 28.2 mL/min (A) (by C-G formula based on SCr of 1.55 mg/dL (H)). Liver Function Tests: Recent Labs  Lab 09/16/19 0844  AST 32  ALT 26  ALKPHOS 98  BILITOT 1.1  PROT 5.2*  ALBUMIN 2.3*   Recent Labs  Lab 09/16/19 0844  LIPASE 32   No results for input(s): AMMONIA in the last 168 hours. Coagulation Profile: No results for input(s): INR, PROTIME in the last 168 hours. Cardiac Enzymes: No results for input(s): CKTOTAL, CKMB, CKMBINDEX, TROPONINI in the last 168 hours. BNP (last 3 results) No results for input(s): PROBNP in the last 8760 hours. HbA1C: No results for input(s): HGBA1C in the last 72 hours. CBG: No results for input(s): GLUCAP in the last 168 hours. Lipid Profile: No results for input(s): CHOL, HDL, LDLCALC, TRIG, CHOLHDL, LDLDIRECT in the last 72 hours. Thyroid Function Tests: No results for input(s): TSH, T4TOTAL, FREET4, T3FREE, THYROIDAB in the last 72 hours. Anemia Panel: No results for input(s): VITAMINB12, FOLATE, FERRITIN, TIBC, IRON, RETICCTPCT in the last 72 hours. Sepsis Labs: No results for input(s): PROCALCITON, LATICACIDVEN in the last 168 hours.  Recent Results (from the past 240  hour(s))  SARS CORONAVIRUS 2 (TAT 6-24 HRS) Nasopharyngeal Nasopharyngeal Swab     Status: None   Collection Time: 09/16/19  8:40 AM   Specimen: Nasopharyngeal Swab  Result Value Ref Range Status   SARS Coronavirus 2 NEGATIVE NEGATIVE Final    Comment: (NOTE) SARS-CoV-2 target nucleic acids are NOT DETECTED. The SARS-CoV-2 RNA is generally detectable in upper and lower respiratory specimens during the acute phase of infection. Negative results do not preclude SARS-CoV-2 infection, do not rule out co-infections with other pathogens, and should not be used as the sole basis for treatment or other patient management decisions. Negative results must be combined with clinical observations, patient history, and epidemiological information. The expected result is Negative. Fact Sheet for Patients: SugarRoll.be Fact Sheet for Healthcare Providers: https://www.woods-mathews.com/ This test is not yet approved  or cleared by the [place] and  has been authorized for detection and/or diagnosis of SARS-CoV-2 by FDA under an Emergency Use Authorization (EUA). This EUA will remain  in effect (meaning this test can be used) for the duration of the COVID-19 declaration under Section 56 4(b)(1) of the Act, 21 U.S.C. section 360bbb-3(b)(1), unless the authorization is terminated or revoked sooner. Performed at [facility], [place] [address] [place], [place] [address]   Urine culture     Status: Abnormal   Collection Time: 09/17/19  9:55 AM   Specimen: Urine, Random  Result Value Ref Range Status   Specimen Description   Final    URINE, RANDOM Performed at [facility], [address], [place], [place] [address]    Special Requests   Final    NONE Performed at [facility], [address], [place], [place] [address]    Culture (A)  Final    <10,000 COLONIES/mL INSIGNIFICANT GROWTH Performed at [facility], [place] [address]  [place], [place] [address]    Report Status 09/19/2019 FINAL  Final      Radiology Studies: No results found.   Scheduled Meds: . dabigatran  75 mg Oral BID  . fluticasone furoate-vilanterol  1 puff Inhalation Daily  . gabapentin  300 mg Oral TID  . hydrALAZINE  50 mg Oral Q8H  . levothyroxine  50 mcg Oral Q0600  . lisinopril  10 mg Oral Daily  . metoprolol succinate  50 mg Oral BID  . pantoprazole  40 mg Oral Daily  . PARoxetine  20 mg Oral Daily  . pravastatin  80 mg Oral QPM  . predniSONE  40 mg Oral Q breakfast  . torsemide  40 mg Oral Daily   Continuous Infusions:   LOS: 2 days     Enzo Bi, MD [facility] If 7PM-7AM, please contact night-coverage 09/19/2019, 10:09 PM

## 2019-09-19 NOTE — Progress Notes (Addendum)
Patient left great toe reddened and warm, states she has gout. Called mild level and infomed of finding as no gout medication is ordered. Instructed by mid level gout med being held due to kidney function.   ------------------- Pt's gout is being treated by prednisone, as is the treatment choice with reduced kidney function, as documented in my progress note from yesterday, and as already been explained to both patient and daughter at the bedside.  Enzo Bi , MD

## 2019-09-19 NOTE — Telephone Encounter (Signed)
FYI-Daughter has appointment on 12/17 to discuss patient going into a skilled nursing home for rehab. Daughter doesn't want mom going into a home because the hospital is not doing rehab on her why she has been their and she is wanting your advise. Kelsey Sandoval has appointment on 12/17 to discuss this patient was already on schedule but change visit type.

## 2019-09-19 NOTE — Telephone Encounter (Signed)
FYI

## 2019-09-19 NOTE — NC FL2 (Signed)
[place] LEVEL OF CARE SCREENING TOOL     IDENTIFICATION  Patient Name: Kelsey Sandoval Birthdate: [birth year] Sex: female Admission Date (Current Location): 09/16/2019  [facility] and [place] Number:  [employer] and Address:  [place] [address], [place]      Provider Number: [id number]  Attending Physician Name and Address:  Enzo Bi, MD  Relative Name and Phone Number:       Current Level of Care: Hospital Recommended Level of Care: [place] Prior Approval Number:    Date Approved/Denied: 09/19/19 PASRR Number: [id number]  Discharge Plan: Home    Current Diagnoses: Patient Active Problem List   Diagnosis Date Noted  . Falls frequently 09/16/2019  . Aortic atherosclerosis ([place]) 10/10/2018  . COPD (chronic obstructive pulmonary disease) ([place]) 10/10/2018  . Hypertensive urgency 07/31/2018  . Atrial fibrillation with RVR ([place]) 01/20/2017  . Nausea 01/20/2017  . Anxiety 01/20/2017  . Acute bronchitis 09/11/2016  . Chronic anticoagulation 09/07/2016  . Chronic kidney disease (CKD), stage III (moderate) 09/07/2016  . Hereditary and idiopathic peripheral neuropathy 03/05/2016  . Major depression 12/04/2015  . Major depression in remission ([place]) 09/02/2015  . Membranous nephropathy determined by biopsy 06/07/2015  . Osteoarthritis of both knees 03/26/2015  . Chronic pain syndrome 03/26/2015  . Intractable nausea and vomiting   . Nausea with vomiting   . Malnutrition of moderate degree ([place]) 03/08/2015  . UTI (lower urinary tract infection) 03/07/2015  . Hypothyroidism 02/14/2015  . Disorder of kidney 01/17/2015  . Nephrotic syndrome 06/21/2014  . Proteinuria 03/29/2014  . Osteopenia 03/29/2014  . Anemia, iron deficiency 08/22/2013  . Mitral insufficiency 06/11/2013  . Pacemaker 06/11/2013  . Paroxysmal atrial fibrillation ([place]) 09/26/2012  . Tachycardia-bradycardia syndrome ([place]) 09/26/2012   . Sick sinus syndrome ([place]) 09/26/2012  . Systemic hypertension 09/26/2012  . Dyslipidemia 09/26/2012    Orientation RESPIRATION BLADDER Height & Weight     Self, Time, Situation  O2(see DC summary) Continent Weight: 71.7 kg Height:  5\' 9"  (175.3 cm)  BEHAVIORAL SYMPTOMS/MOOD NEUROLOGICAL BOWEL NUTRITION STATUS      Continent Diet(See DC summary)  AMBULATORY STATUS COMMUNICATION OF NEEDS Skin   Extensive Assist Verbally Normal                       Personal Care Assistance Level of Assistance  Bathing, Dressing, Feeding Bathing Assistance: Limited assistance Feeding assistance: Independent Dressing Assistance: Limited assistance     Functional Limitations Info  Sight, Hearing, Speech Sight Info: Adequate Hearing Info: Adequate Speech Info: Adequate    SPECIAL CARE FACTORS FREQUENCY  PT (By licensed PT)     PT Frequency: 5x/week              Contractures Contractures Info: Not present    Additional Factors Info  Code Status, Allergies, Psychotropic Code Status Info: Full code Allergies Info: PCN, levaquin, sulfa, zithromax Psychotropic Info: xanax, cymbalta, neurontin, paxil         Current Medications (09/19/2019):  This is the current hospital active medication list Current Facility-Administered Medications  Medication Dose Route Frequency Provider Last Rate Last Admin  . albuterol (PROVENTIL) (2.5 MG/3ML) 0.083% nebulizer solution 3 mL  3 mL Inhalation Q6H PRN Enzo Bi, MD      . ALPRAZolam Duanne Moron) tablet 0.5 mg  0.5 mg Oral BID PRN Enzo Bi, MD   0.5 mg at 09/18/19 3007  . dabigatran (PRADAXA) capsule 75 mg  75  mg Oral BID Enzo Bi, MD   75 mg at 09/19/19 0959  . fluticasone furoate-vilanterol (BREO ELLIPTA) 200-25 MCG/INH 1 puff  1 puff Inhalation Daily Enzo Bi, MD   1 puff at 09/19/19 0758  . gabapentin (NEURONTIN) capsule 300 mg  300 mg Oral TID Enzo Bi, MD   300 mg at 09/19/19 0959  . hydrALAZINE (APRESOLINE) tablet 50 mg  50 mg Oral Q8H  Enzo Bi, MD   50 mg at 09/19/19 8242  . HYDROcodone-acetaminophen (NORCO) 10-325 MG per tablet 1 tablet  1 tablet Oral Q6H PRN Enzo Bi, MD   1 tablet at 09/18/19 2218  . levothyroxine (SYNTHROID) tablet 50 mcg  50 mcg Oral Q0600 Enzo Bi, MD   50 mcg at 09/19/19 [phone]  . lisinopril (ZESTRIL) tablet 10 mg  10 mg Oral Daily Enzo Bi, MD   10 mg at 09/19/19 0958  . metoprolol succinate (TOPROL-XL) 24 hr tablet 50 mg  50 mg Oral BID Enzo Bi, MD   50 mg at 09/19/19 0957  . pantoprazole (PROTONIX) EC tablet 40 mg  40 mg Oral Daily Enzo Bi, MD   40 mg at 09/19/19 0957  . PARoxetine (PAXIL) tablet 20 mg  20 mg Oral Daily Enzo Bi, MD   20 mg at 09/19/19 0957  . pravastatin (PRAVACHOL) tablet 80 mg  80 mg Oral QPM Enzo Bi, MD   80 mg at 09/18/19 1859  . predniSONE (DELTASONE) tablet 40 mg  40 mg Oral Q breakfast Enzo Bi, MD   40 mg at 09/18/19 2204  . torsemide (DEMADEX) tablet 40 mg  40 mg Oral Daily Enzo Bi, MD   40 mg at 09/19/19 1443     Discharge Medications: Please see discharge summary for a list of discharge medications.  Relevant Imaging Results:  Relevant Lab Results:   Additional Information SSN [id number]  Keiasia Christianson, Chauncey Reading, RN

## 2019-09-19 NOTE — TOC Progression Note (Addendum)
Transition of Care [facility]) - Progression Note    Patient Details  Name: [name] MRN: [id number] Date of Birth: November 21, 1932  Transition of [facility] CM/SW Contact  Jackye Dever, Chauncey Reading, RN Phone Number: 09/19/2019, 1:27 PM  Clinical Narrative:  Discussed bed offers with daughter. Discussed CMS medicare.gov star ratings of facilities.  She would prefer [facility]. However, she reports she has been talking with her mother today and patient is usually independent and having second thoughts about SNF.   Requesting another PT eval prior to making final decision for SNF.   Patient has insurance authorization, good for 7 days. Will need EMS transport to facility. Prior auth for EMS transport has been requested.     Expected Discharge Plan: [place] Barriers to Discharge: Continued Medical Work up, SNF Pending bed offer  Expected Discharge Plan and Services Expected Discharge Plan: [place]   Discharge Planning Services: CM Consult Post Acute Care Choice: Nursing Home Living arrangements for the past 2 months: Single Family Home                      Social Determinants of Health (SDOH) Interventions    Readmission Risk Interventions No flowsheet data found.

## 2019-09-19 NOTE — Care Management (Signed)
      1:[facility]  [place], [place] [address]  [phone]     Overall rating    Much above average  Compare    [place], [place] [address]  [phone]     Overall rating    Below average  Compare    3.[place]       [place], [place] [address]  [phone]     Overall rating    Above average  Compare    4.[place]       [place], [place] [address]  [phone]     Overall rating    Average  Compare          5.[facility] and [facility]       [place], [place] [address]  [phone]     Overall rating    Much below average  Compare    6.[place]       [address] [place], [place] [address]  [phone]     Overall rating    Average  Compare    7.[facility]       [address]  [place], [place] [address]  [phone]     Overall rating    Above average  Compare    8.[facility]       [place], [place] [address]  [phone]     Overall rating    Much below average  Compare    9.[facility] and [place]  [place], [place] [address]  [phone]     Overall rating    Much below average  Compare    10.[facility] [place], [place] [address]  [phone]     Overall rating    Below average  Compare

## 2019-09-20 DIAGNOSIS — R41841 Cognitive communication deficit: Secondary | ICD-10-CM | POA: Diagnosis not present

## 2019-09-20 DIAGNOSIS — J189 Pneumonia, unspecified organism: Secondary | ICD-10-CM | POA: Diagnosis not present

## 2019-09-20 DIAGNOSIS — I700 Atherosclerosis of aorta: Secondary | ICD-10-CM | POA: Diagnosis not present

## 2019-09-20 DIAGNOSIS — I5041 Acute combined systolic (congestive) and diastolic (congestive) heart failure: Secondary | ICD-10-CM | POA: Diagnosis not present

## 2019-09-20 DIAGNOSIS — J449 Chronic obstructive pulmonary disease, unspecified: Secondary | ICD-10-CM | POA: Diagnosis not present

## 2019-09-20 DIAGNOSIS — S0990XA Unspecified injury of head, initial encounter: Secondary | ICD-10-CM | POA: Diagnosis not present

## 2019-09-20 DIAGNOSIS — F411 Generalized anxiety disorder: Secondary | ICD-10-CM | POA: Diagnosis not present

## 2019-09-20 DIAGNOSIS — M545 Low back pain: Secondary | ICD-10-CM | POA: Diagnosis not present

## 2019-09-20 DIAGNOSIS — R531 Weakness: Secondary | ICD-10-CM | POA: Diagnosis not present

## 2019-09-20 DIAGNOSIS — N183 Chronic kidney disease, stage 3 unspecified: Secondary | ICD-10-CM | POA: Diagnosis not present

## 2019-09-20 DIAGNOSIS — R52 Pain, unspecified: Secondary | ICD-10-CM | POA: Diagnosis not present

## 2019-09-20 DIAGNOSIS — I480 Paroxysmal atrial fibrillation: Secondary | ICD-10-CM | POA: Diagnosis not present

## 2019-09-20 DIAGNOSIS — M6281 Muscle weakness (generalized): Secondary | ICD-10-CM | POA: Diagnosis not present

## 2019-09-20 DIAGNOSIS — Z1159 Encounter for screening for other viral diseases: Secondary | ICD-10-CM | POA: Diagnosis not present

## 2019-09-20 DIAGNOSIS — S299XXA Unspecified injury of thorax, initial encounter: Secondary | ICD-10-CM | POA: Diagnosis not present

## 2019-09-20 DIAGNOSIS — Z7901 Long term (current) use of anticoagulants: Secondary | ICD-10-CM | POA: Diagnosis not present

## 2019-09-20 DIAGNOSIS — J90 Pleural effusion, not elsewhere classified: Secondary | ICD-10-CM | POA: Diagnosis not present

## 2019-09-20 DIAGNOSIS — I10 Essential (primary) hypertension: Secondary | ICD-10-CM | POA: Diagnosis not present

## 2019-09-20 DIAGNOSIS — M7981 Nontraumatic hematoma of soft tissue: Secondary | ICD-10-CM | POA: Diagnosis not present

## 2019-09-20 DIAGNOSIS — S199XXA Unspecified injury of neck, initial encounter: Secondary | ICD-10-CM | POA: Diagnosis not present

## 2019-09-20 DIAGNOSIS — R296 Repeated falls: Secondary | ICD-10-CM | POA: Diagnosis not present

## 2019-09-20 DIAGNOSIS — R079 Chest pain, unspecified: Secondary | ICD-10-CM | POA: Diagnosis not present

## 2019-09-20 DIAGNOSIS — I1310 Hypertensive heart and chronic kidney disease without heart failure, with stage 1 through stage 4 chronic kidney disease, or unspecified chronic kidney disease: Secondary | ICD-10-CM | POA: Diagnosis not present

## 2019-09-20 LAB — RESPIRATORY PANEL BY RT PCR (FLU A&B, COVID)
Influenza A by PCR: NEGATIVE
Influenza B by PCR: NEGATIVE
SARS Coronavirus 2 by RT PCR: NEGATIVE

## 2019-09-20 MED ORDER — ALPRAZOLAM 0.25 MG PO TABS: 0.2500 mg | ORAL_TABLET | Freq: Two times a day (BID) | ORAL | 0 refills | Status: DC | PRN

## 2019-09-20 MED ORDER — HYDRALAZINE HCL 50 MG PO TABS: 50.0000 mg | ORAL_TABLET | Freq: Three times a day (TID) | ORAL | 5 refills | Status: DC

## 2019-09-20 MED ORDER — HYDROXYZINE HCL 25 MG PO TABS: 25.0000 mg | ORAL_TABLET | Freq: Three times a day (TID) | ORAL | 2 refills | Status: DC | PRN

## 2019-09-20 MED ORDER — BUSPIRONE HCL 10 MG PO TABS: 10.0000 mg | ORAL_TABLET | Freq: Three times a day (TID) | ORAL | 2 refills | Status: DC

## 2019-09-20 MED ORDER — METOPROLOL SUCCINATE ER 50 MG PO TB24: 50.0000 mg | ORAL_TABLET | Freq: Two times a day (BID) | ORAL | 3 refills | Status: DC

## 2019-09-20 MED ORDER — TORSEMIDE 20 MG PO TABS: 40.0000 mg | ORAL_TABLET | Freq: Every day | ORAL | 2 refills | Status: AC

## 2019-09-20 MED ORDER — HYDROCODONE-ACETAMINOPHEN 5-325 MG PO TABS: 1.0000 | ORAL_TABLET | Freq: Four times a day (QID) | ORAL | 0 refills | Status: AC | PRN

## 2019-09-20 MED ORDER — PREDNISONE 20 MG PO TABS: 20.0000 mg | ORAL_TABLET | Freq: Every day | ORAL | 0 refills | Status: AC

## 2019-09-20 MED ORDER — LISINOPRIL 20 MG PO TABS: 20.0000 mg | ORAL_TABLET | Freq: Every day | ORAL | 3 refills | Status: AC

## 2019-09-20 MED ORDER — ALPRAZOLAM 0.25 MG PO TABS: 0.5000 mg | ORAL_TABLET | Freq: Two times a day (BID) | ORAL | 0 refills | Status: DC | PRN

## 2019-09-20 MED ORDER — BUSPIRONE HCL 5 MG PO TABS: 5.0000 mg | ORAL_TABLET | Freq: Three times a day (TID) | ORAL | 2 refills | Status: DC

## 2019-09-20 NOTE — Discharge Summary (Addendum)
Kelsey Sandoval, is a 85 y.o. female  DOB 1933/01/10  MRN [id number].  Admission date:  09/16/2019  Admitting Physician  Enzo Bi, MD  Discharge Date:  09/20/2019   Primary MD  Kathyrn Drown, MD  Recommendations for primary care physician for things to follow:   Avoid ibuprofen/Advil/Aleve/Motrin/Goody Powders/Naproxen/BC powders/Meloxicam/Diclofenac/Indomethacin and other Nonsteroidal anti-inflammatory medications as these will make you more likely to bleed and can cause stomach ulcers, can also cause Kidney problems.   --- Repeat CBC and BMP test within a week  Admission Diagnosis  Falls frequently [R29.6] Fall [W19.XXXA] Anticoagulated [Z79.01] Multiple skin tears [T14.8XXA] Hematoma of scalp, initial encounter [S00.03XA]   Discharge Diagnosis  Falls frequently [R29.6] Fall [W19.XXXA] Anticoagulated [Z79.01] Multiple skin tears [T14.8XXA] Hematoma of scalp, initial encounter [S00.03XA]    Principal Problem:   Falls frequently Active Problems:   Paroxysmal atrial fibrillation (HCC)   Sick sinus syndrome (HCC)   Hypothyroidism   Chronic pain syndrome   Major depression in remission (HCC)   Chronic anticoagulation   Chronic kidney disease (CKD), stage III (moderate)   COPD (chronic obstructive pulmonary disease) (HCC)      Past Medical History:  Diagnosis Date  . Anxiety   . Aortic atherosclerosis ([place]) 10/10/2018  . Arthritis   . Back pain, chronic    Sciatica  . Cataracts, bilateral   . COPD (chronic obstructive pulmonary disease) ([place]) 10/10/2018  . Depression   . Dysrhythmia    AFib  . Essential hypertension   . GERD (gastroesophageal reflux disease)   . Hyperlipidemia   . Hypothyroidism   . Membranous nephropathy determined by biopsy 06/07/2015  . Nephrotic syndrome    RHUX  . Neuropathy   . Osteopenia   . Pacemaker    [employer]  . PAF (paroxysmal atrial fibrillation)  ([place])   . Pre-diabetes   . Tachycardia-bradycardia syndrome [facility])     Past Surgical History:  Procedure Laterality Date  . ABDOMINAL HYSTERECTOMY     partial-pt has no ovaries  . ANTERIOR VITRECTOMY Right 03/11/2018   Procedure: ANTERIOR VITRECTOMY;  Surgeon: Baruch Goldmann, MD;  Location: [facility];  Service: Ophthalmology;  Laterality: Right;  . APPENDECTOMY    . BACK SURGERY    . BIOPSY N/A 01/17/2014   Procedure: BIOPSY;  Surgeon: Rogene Houston, MD;  Location: [facility];  Service: Endoscopy;  Laterality: N/A;  . CATARACT EXTRACTION W/PHACO Left 02/28/2015   Procedure: CATARACT EXTRACTION PHACO AND INTRAOCULAR LENS PLACEMENT (IOC);  Surgeon: [name], MD;  Location: [facility];  Service: Ophthalmology;  Laterality: Left;  CDE 18.71  . CATARACT EXTRACTION W/PHACO Right 03/11/2018   Procedure: CATARACT EXTRACTION PHACO  AND INTRAOCULAR LENS PLACEMENT RIGHT EYE ;  Surgeon: Baruch Goldmann, MD;  Location: [facility];  Service: Ophthalmology;  Laterality: Right;  CDE: 19.39  . COLONOSCOPY  9/05  . COLONOSCOPY N/A 10/11/2013   Procedure: COLONOSCOPY;  Surgeon: Rogene Houston, MD;  Location: [facility];  Service: Endoscopy;  Laterality: N/A;  1200  . ESOPHAGOGASTRODUODENOSCOPY  N/A 01/17/2014   Procedure: ESOPHAGOGASTRODUODENOSCOPY (EGD);  Surgeon: Rogene Houston, MD;  Location: [facility];  Service: Endoscopy;  Laterality: N/A;  230  . ESOPHAGOGASTRODUODENOSCOPY N/A 01/02/2016   Procedure: ESOPHAGOGASTRODUODENOSCOPY (EGD);  Surgeon: Rogene Houston, MD;  Location: [facility];  Service: Endoscopy;  Laterality: N/A;  240  . INSERT / REPLACE / REMOVE PACEMAKER     2012  . Wisdom tooth extracton         HPI  from the history and physical done on the day of admission:   Chief Complaint: fall  HPI: Kelsey Sandoval is a 85 y.o. female with medical history significant of COPD not on home O2, chronic afib on pradaxa, chronic pain on chronic opioids, anxiety and depression, HTN who presented  after a fall.   Hx obtained with pt and her daughter on speaker phone.  Pt fell around 3 am when she got up to go to the bathroom.  Pt has been having frequent falls recently, usually occurs first thing in the morning or in the middle of the night.  Pt reported that when she gets upright, she feels something washing over her and gets weak, however, unsure if she ever looses consciousness prior to her falls.  Her PCP is aware of her frequent falls and has ordered HHPT, however, it hasn't started yet.  Pt lives alone with family members checking on her frequently.  She does walk with a walker.  No recent illness.  Pt has chronic back pain and sciatica for which she takes opioids pain meds and gabapentin.  Pt also has chronic anxiety for which she takes Xanax nightly to help her sleep.  Pt has depression for which she takes many duplicate psych meds (daughter said Paxil has helped the most with pts moods).  Pt takes medication for her restless legs.  Pt is also taking many different blood pressure medications with variable blood pressure measurements.  Pt recently asked her PCP for a course of prednisone (7 days) to take just to make her feel better and improve her appetite.  Pt has COPD and chronic dyspnea which improved some after her PCP added some inhalers.  No fever, dyspnea, cough, chest pain, abdominal pain, N/V/D, dysuria.  Of note, daughter mentioned that pt recently had AKI and even suggested herself that pt may have decreased clearance of her medications due to reduced kidney function (daughter is involved in health care.)   ED Course: initial vitals: afebrile, HR 91, BP 198/122, sating 100% on room air.  Labs notable for WBC 19.1, normal Hgb, potassium 3, Cr 1.47 (around baseline), albumin 2.3, BG 99, trop 39 and flat, EKG showed afib with no ACS-related finding, pan-scan showed Hematoma over the RIGHT orbital rim but no other acute findings.  Pt was admitted for observation.     Hospital  Course:     Brief summary GENEVIVE PRINTUP a 85 y.o.femalewith medical history significant ofCOPD not on home O2, chronic afib on pradaxa, chronic pain on chronic opioids, anxiety and depression, HTN who presented after a fall.    # Fall at home # Recent recurrent falls # Polypharmacy --Falls happen mostly first thing in the morning or middle of the nights. Significant risk factors include multiple sedating medications, multiple blood pressure medications that may cause orthostasis, and weakness associated with her age and cormorbidities. Daughter also mentioned that pt recently had AKI and her kidney function may have declined to cause reduced clearance of her medications. --pan-scan  in the ED showedHematoma over the RIGHT orbital rimbut no other acute findings.  PLAN: --Sedative medications including opiates and benzos have been adjusted to avoid oversedation in the setting of recurrent falls-- -PT eval appreciated patient going to SNF for rehab   # COPD not on home O2, stable --No acute exacerbation at this time continue home Breo  # Chronic afib on home Pradaxa, rate controlled --Stable, continue home metoprolol and Pradaxa  # Chronic MSK pain and sciatica  --continue home gabapentin 300 mg TID for now -Hydrocodone adjusted due to oversedation and recurrent falls  # Chronic anxiety and depression -Stop Paxil due to tendency for sedation ---Decrease BuSpar to 5 mg 3 times daily --Continue Cymbalta given chronic pain syndrome --Decrease Xanax to 0.25 mg  BID PRN for now  # HTN --Has been high in 180's despite being on all her home BP meds.  -BP control improving with medication adjustments --Hydralazine increased to 50 mg 3 times daily, --Lisinopril increased to 20 mg daily --continue home metop, torsemide    # Hypothyroidism --continue home Synthroid  # HLD --continue home statin  # GERD --continue home PPI  # Left great toe pain and  erythema --Possible gout ----improved with prednisone, okay to decrease on prednisone 20 mg daily for additional 3 days  Discharge Condition: stable  Follow UP  Contact information for after-discharge care    Destination    [facility] .   Service: Skilled Nursing Contact information: [address] [phone]              Diet and Activity recommendation:  As advised  Discharge Instructions    Discharge Instructions    Call MD for:  difficulty breathing, headache or visual disturbances   Complete by: As directed    Call MD for:  persistant dizziness or light-headedness   Complete by: As directed    Call MD for:  persistant nausea and vomiting   Complete by: As directed    Call MD for:  severe uncontrolled pain   Complete by: As directed    Call MD for:  temperature >100.4   Complete by: As directed    Diet - low sodium heart healthy   Complete by: As directed    Discharge instructions   Complete by: As directed    1)Avoid ibuprofen/Advil/Aleve/Motrin/Goody Powders/Naproxen/BC powders/Meloxicam/Diclofenac/Indomethacin and other Nonsteroidal anti-inflammatory medications as these will make you more likely to bleed and can cause stomach ulcers, can also cause Kidney problems.   Increase activity slowly   Complete by: As directed         Discharge Medications     Allergies as of 09/20/2019      Reactions   Penicillins Other (See Comments)   Caused patient to pass out.Can take cephalosporins Has patient had a PCN reaction causing immediate rash, facial/tongue/throat swelling, SOB or lightheadedness with hypotension: no Has patient had a PCN reaction causing severe rash involving mucus membranes or skin necrosis: No Has patient had a PCN reaction that required hospitalization No Has patient had a PCN reaction occurring within the last 10 years: No If all of the above  answers are "NO", then may proceed with Cephalosporin use.   Levaquin [levofloxacin] Nausea Only   Sulfa Antibiotics Other (See Comments)   Unknown   Zithromax [azithromycin] Nausea Only, Rash      Medication List    STOP taking these medications   HYDROcodone-acetaminophen 10-325  MG tablet Commonly known as: NORCO Replaced by: HYDROcodone-acetaminophen 5-325 MG tablet   PARoxetine 20 MG tablet Commonly known as: PAXIL     TAKE these medications   ALPRAZolam 0.25 MG tablet Commonly known as: XANAX Take 1 tablet (0.25 mg total) by mouth 2 (two) times daily as needed for anxiety or sleep. What changed:   medication strength  how much to take  reasons to take this  additional instructions   budesonide-formoterol 160-4.5 MCG/ACT inhaler Commonly known as: SYMBICORT Inhale 2 puffs into the lungs 2 (two) times daily.   busPIRone 5 MG tablet Commonly known as: BUSPAR Take 1 tablet (5 mg total) by mouth 3 (three) times daily. What changed:   medication strength  how much to take   DULoxetine 60 MG capsule Commonly known as: CYMBALTA Take 1 capsule (60 mg total) by mouth daily.   gabapentin 300 MG capsule Commonly known as: NEURONTIN TAKE 1 CAPSULE BY MOUTH THREE TIMES DAILY   hydrALAZINE 50 MG tablet Commonly known as: APRESOLINE Take 1 tablet (50 mg total) by mouth 3 (three) times daily. What changed:   medication strength  how much to take  when to take this   HYDROcodone-acetaminophen 5-325 MG tablet Commonly known as: NORCO/VICODIN Take 1 tablet by mouth every 6 (six) hours as needed for moderate pain or severe pain. Replaces: HYDROcodone-acetaminophen 10-325 MG tablet   hydrOXYzine 25 MG tablet Commonly known as: ATARAX/VISTARIL Take 1 tablet (25 mg total) by mouth every 8 (eight) hours as needed for anxiety or itching. prn itching. Caution drowiness What changed:   how much to take  how to take this  when to take this  reasons to take  this  additional instructions   levothyroxine 50 MCG tablet Commonly known as: SYNTHROID TAKE 1 TABLET BY MOUTH ONCE DAILY BEFORE BREAKFAST   lisinopril 10 MG tablet Commonly known as: ZESTRIL Take 5 mg by mouth. Takes one half of 10mg  What changed: Another medication with the same name was added. Make sure you understand how and when to take each.   lisinopril 20 MG tablet Commonly known as: ZESTRIL Take 1 tablet (20 mg total) by mouth daily. Start taking on: September 21, 2019 What changed: You were already taking a medication with the same name, and this prescription was added. Make sure you understand how and when to take each.   metoprolol succinate 50 MG 24 hr tablet Commonly known as: TOPROL-XL Take 1 tablet (50 mg total) by mouth 2 (two) times daily. Take with or immediately following a meal. What changed: See the new instructions.   ondansetron 8 MG tablet Commonly known as: Zofran Take 1 tablet (8 mg total) by mouth every 8 (eight) hours as needed for nausea.   pantoprazole 40 MG tablet Commonly known as: PROTONIX Take 1 tablet (40 mg total) by mouth daily.   Pradaxa 75 MG Caps capsule Generic drug: dabigatran Take 1 capsule by mouth twice daily   pravastatin 80 MG tablet Commonly known as: PRAVACHOL Take 1 tablet (80 mg total) by mouth every evening.   predniSONE 20 MG tablet Commonly known as: Deltasone Take 1 tablet (20 mg total) by mouth daily with breakfast for 3 days.   ProAir HFA 108 (90 Base) MCG/ACT inhaler Generic drug: albuterol INHALE 2 PUFFS INTO LUNGS EVERY 6 HOURS AS NEEDED FOR WHEEZING OR SHORTNESS OF BREATH   torsemide 20 MG tablet Commonly known as: DEMADEX Take 2 tablets (40 mg total) by mouth daily. Start taking on:  September 21, 2019   triamcinolone cream 0.1 % Commonly known as: KENALOG Apply 1 application topically 2 (two) times daily as needed.   vitamin B-12 1000 MCG tablet Commonly known as: CYANOCOBALAMIN Take 1 tablet  (1,000 mcg total) by mouth daily. What changed: when to take this      Major procedures and Radiology Reports - PLEASE review detailed and final reports for all details, in brief -   DG Chest 2 View  Result Date: 09/08/2019 CLINICAL DATA:  Shortness of breath, COPD EXAM: CHEST - 2 VIEW COMPARISON:  12/05/2018 FINDINGS: Right pacer remains in place, unchanged. Small left pleural effusion with left base atelectasis. Trace right pleural effusions suspected. Heart is upper limits normal in size. No acute bony abnormality. IMPRESSION: Small bilateral effusions, left greater than right. Left base atelectasis. Electronically Signed   By: Rolm Baptise M.D.   On: 09/08/2019 03:39   DG Pelvis 1-2 Views  Result Date: 09/16/2019 CLINICAL DATA:  Per triage note: Pt states she was getting up to go to the bathroom when she fell, not sure if she was dizzy or just lost her balance. Pt has swelling to over right eye, skin tear to right shoulder, and swelling to right knee with small skin tear. fall EXAM: PELVIS - 1-2 VIEW COMPARISON:  None. FINDINGS: Hips are located. No pelvic fracture or sacral fracture. No evidence of femoral neck fracture on single view. IMPRESSION: 1. No pelvic fracture. Electronically Signed   By: Suzy Bouchard M.D.   On: 09/16/2019 07:35   DG Shoulder Right  Result Date: 09/16/2019 CLINICAL DATA:  Per triage note: Pt states she was getting up to go to the bathroom when she fell, not sure if she was dizzy or just lost her balance. Pt has swelling to over right eye, skin tear to right shoulder, and swelling to right knee with small skin tear. fall EXAM: RIGHT SHOULDER - 2+ VIEW COMPARISON:  None. FINDINGS: Glenohumeral joint is intact. No evidence of scapular fracture or humeral fracture. The acromioclavicular joint is intact. The humerus rides high in the glenohumeral joint suggesting chronic rotator cuff pathology. IMPRESSION: No fracture or dislocation of the right shoulder.  Electronically Signed   By: Suzy Bouchard M.D.   On: 09/16/2019 07:36   CT Head  Result Date: 09/16/2019 CLINICAL DATA:  Fall, pradaxa Facial trauma, Right orbital wall swelling/fx Per ed note: Pt states she was getting up to go to the bathroom when she fell, not sure if she was dizzy or just lost her balance. Pt has swelling to over right eye, EXAM: CT HEAD WITHOUT CONTRAST CT MAXILLOFACIAL WITHOUT CONTRAST CT CERVICAL SPINE WITHOUT CONTRAST TECHNIQUE: Multidetector CT imaging of the head, cervical spine, and maxillofacial structures were performed using the standard protocol without intravenous contrast. Multiplanar CT image reconstructions of the cervical spine and maxillofacial structures were also generated. COMPARISON:  None. FINDINGS: CT HEAD FINDINGS Brain: No intracranial hemorrhage. No parenchymal contusion. No midline shift or mass effect. Basilar cisterns are patent. No skull base fracture. No fluid in the paranasal sinuses or mastoid air cells. Orbits are normal. There are periventricular and subcortical white matter hypodensities. Generalized cortical atrophy. Vascular: No hyperdense vessel or unexpected calcification. Skull: No skull fracture Other: Hematoma over the RIGHT orbital rim measuring 1.6 cm in depth. No associated skull fracture CT MAXILLOFACIAL FINDINGS Osseous: No orbital wall fracture. Maxillary sinus walls are intact. Pterygoid plates are intact. Zygomatic arches intact. The mandibular condyles are located. No mandibular fracture. Orbits:  Intraconal contents globes are intact. No proptosis. Sinuses: No fluid sinuses Soft tissues: Unremarkable CT CERVICAL SPINE FINDINGS Alignment: Normal alignment of the cervical vertebral bodies. Skull base and vertebrae: Normal craniocervical junction. No loss of vertebral body height or disc height. Normal facet articulation. No evidence of fracture. Soft tissues and spinal canal: No prevertebral soft tissue swelling. No perispinal or epidural  hematoma. Disc levels:  Unremarkable Upper chest: Layering LEFT effusion Other: None IMPRESSION: 1. No intracranial trauma. 2. Hematoma over the RIGHT orbital rim. 3. No facial bone fracture. 4. No cervical spine fracture. 5. Layering LEFT effusion. Electronically Signed   By: Suzy Bouchard M.D.   On: 09/16/2019 06:19   CT Cervical Spine  Result Date: 09/16/2019 CLINICAL DATA:  Fall, pradaxa Facial trauma, Right orbital wall swelling/fx Per ed note: Pt states she was getting up to go to the bathroom when she fell, not sure if she was dizzy or just lost her balance. Pt has swelling to over right eye, EXAM: CT HEAD WITHOUT CONTRAST CT MAXILLOFACIAL WITHOUT CONTRAST CT CERVICAL SPINE WITHOUT CONTRAST TECHNIQUE: Multidetector CT imaging of the head, cervical spine, and maxillofacial structures were performed using the standard protocol without intravenous contrast. Multiplanar CT image reconstructions of the cervical spine and maxillofacial structures were also generated. COMPARISON:  None. FINDINGS: CT HEAD FINDINGS Brain: No intracranial hemorrhage. No parenchymal contusion. No midline shift or mass effect. Basilar cisterns are patent. No skull base fracture. No fluid in the paranasal sinuses or mastoid air cells. Orbits are normal. There are periventricular and subcortical white matter hypodensities. Generalized cortical atrophy. Vascular: No hyperdense vessel or unexpected calcification. Skull: No skull fracture Other: Hematoma over the RIGHT orbital rim measuring 1.6 cm in depth. No associated skull fracture CT MAXILLOFACIAL FINDINGS Osseous: No orbital wall fracture. Maxillary sinus walls are intact. Pterygoid plates are intact. Zygomatic arches intact. The mandibular condyles are located. No mandibular fracture. Orbits: Intraconal contents globes are intact. No proptosis. Sinuses: No fluid sinuses Soft tissues: Unremarkable CT CERVICAL SPINE FINDINGS Alignment: Normal alignment of the cervical vertebral  bodies. Skull base and vertebrae: Normal craniocervical junction. No loss of vertebral body height or disc height. Normal facet articulation. No evidence of fracture. Soft tissues and spinal canal: No prevertebral soft tissue swelling. No perispinal or epidural hematoma. Disc levels:  Unremarkable Upper chest: Layering LEFT effusion Other: None IMPRESSION: 1. No intracranial trauma. 2. Hematoma over the RIGHT orbital rim. 3. No facial bone fracture. 4. No cervical spine fracture. 5. Layering LEFT effusion. Electronically Signed   By: Suzy Bouchard M.D.   On: 09/16/2019 06:19   XR Chest Single View  Result Date: 09/16/2019 CLINICAL DATA:  Per triage note: Pt states she was getting up to go to the bathroom when she fell, not sure if she was dizzy or just lost her balance. Pt has swelling to over right eye, skin tear to right shoulder, and swelling to right knee with small skin tear. EXAM: PORTABLE CHEST 1 VIEW COMPARISON:  None. FINDINGS: LEFT-sided pacemaker overlies stable cardiac silhouette. A small LEFT effusion not changed from prior. Otherwise lungs are clear. No fracture or pneumothorax. IMPRESSION: No change in small LEFT effusion.  No evidence of trauma. Electronically Signed   By: Suzy Bouchard M.D.   On: 09/16/2019 07:38   DG Knee Complete 4 Views Left  Result Date: 09/16/2019 CLINICAL DATA:  Per triage note: Pt states she was getting up to go to the bathroom when she fell, not sure if she was  dizzy or just lost her balance. Pt has swelling to over right eye, skin tear to right shoulder, and swelling to right knee with small skin tear. fall EXAM: LEFT KNEE - COMPLETE 4+ VIEW COMPARISON:  None. FINDINGS: Severe narrowing of the medial compartment with bone on bone approximation and osteophytosis. Spurring of the superior patella. No joint effusion. No acute fracture dislocation. 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 No acute findings.  Electronically Signed   By: Suzy Bouchard M.D.   On: 09/16/2019 07:32   DG Knee Complete 4 Views Right  Result Date: 09/16/2019 CLINICAL DATA:  Per triage note: Pt states she was getting up to go to the bathroom when she fell, not sure if she was dizzy or just lost her balance. Pt has swelling to over right eye, skin tear to right shoulder, and swelling to right knee with small skin tear. fall EXAM: RIGHT KNEE - COMPLETE 4+ VIEW COMPARISON:  None. FINDINGS: Osteophytosis and joint space narrowing of the medial compartment. Spurring of the patella. Vascular calcifications. No joint effusion. IMPRESSION: 1. No acute findings of the RIGHT knee. 2. Osteoarthritis most severe in the medial compartment. Electronically Signed   By: Suzy Bouchard M.D.   On: 09/16/2019 07:33   CT Maxillofacial Wo Contrast  Result Date: 09/16/2019 CLINICAL DATA:  Fall, pradaxa Facial trauma, Right orbital wall swelling/fx Per ed note: Pt states she was getting up to go to the bathroom when she fell, not sure if she was dizzy or just lost her balance. Pt has swelling to over right eye, EXAM: CT HEAD WITHOUT CONTRAST CT MAXILLOFACIAL WITHOUT CONTRAST CT CERVICAL SPINE WITHOUT CONTRAST TECHNIQUE: Multidetector CT imaging of the head, cervical spine, and maxillofacial structures were performed using the standard protocol without intravenous contrast. Multiplanar CT image reconstructions of the cervical spine and maxillofacial structures were also generated. COMPARISON:  None. FINDINGS: CT HEAD FINDINGS Brain: No intracranial hemorrhage. No parenchymal contusion. No midline shift or mass effect. Basilar cisterns are patent. No skull base fracture. No fluid in the paranasal sinuses or mastoid air cells. Orbits are normal. There are periventricular and subcortical white matter hypodensities. Generalized cortical atrophy. Vascular: No hyperdense vessel or unexpected calcification. Skull: No skull fracture Other: Hematoma over the  RIGHT orbital rim measuring 1.6 cm in depth. No associated skull fracture CT MAXILLOFACIAL FINDINGS Osseous: No orbital wall fracture. Maxillary sinus walls are intact. Pterygoid plates are intact. Zygomatic arches intact. The mandibular condyles are located. No mandibular fracture. Orbits: Intraconal contents globes are intact. No proptosis. Sinuses: No fluid sinuses Soft tissues: Unremarkable CT CERVICAL SPINE FINDINGS Alignment: Normal alignment of the cervical vertebral bodies. Skull base and vertebrae: Normal craniocervical junction. No loss of vertebral body height or disc height. Normal facet articulation. No evidence of fracture. Soft tissues and spinal canal: No prevertebral soft tissue swelling. No perispinal or epidural hematoma. Disc levels:  Unremarkable Upper chest: Layering LEFT effusion Other: None IMPRESSION: 1. No intracranial trauma. 2. Hematoma over the RIGHT orbital rim. 3. No facial bone fracture. 4. No cervical spine fracture. 5. Layering LEFT effusion. Electronically Signed   By: Suzy Bouchard M.D.   On: 09/16/2019 06:19    Micro Results   Recent Results (from the past 240 hour(s))  [facility] CORONAVIRUS 2 (TAT 6-24 HRS) Nasopharyngeal Nasopharyngeal Swab     Status: None   Collection Time: 09/16/19  8:40 AM   Specimen: Nasopharyngeal Swab  Result Value Ref Range Status   SARS Coronavirus 2 NEGATIVE  NEGATIVE Final    Comment: (NOTE) SARS-CoV-2 target nucleic acids are NOT DETECTED. The SARS-CoV-2 RNA is generally detectable in upper and lower respiratory specimens during the acute phase of infection. Negative results do not preclude SARS-CoV-2 infection, do not rule out co-infections with other pathogens, and should not be used as the sole basis for treatment or other patient management decisions. Negative results must be combined with clinical observations, patient history, and epidemiological information. The expected result is Negative. Fact Sheet for  Patients: SugarRoll.be Fact Sheet for Healthcare Providers: https://www.woods-mathews.com/ This test is not yet approved or cleared by the [place] FDA and  has been authorized for detection and/or diagnosis of SARS-CoV-2 by FDA under an Emergency Use Authorization (EUA). This EUA will remain  in effect (meaning this test can be used) for the duration of the COVID-19 declaration under Section 56 4(b)(1) of the Act, 21 U.S.C. section 360bbb-3(b)(1), unless the authorization is terminated or revoked sooner. Performed at [facility], [place] [address] [place], [place] [address]   Urine culture     Status: Abnormal   Collection Time: 09/17/19  9:55 AM   Specimen: Urine, Random  Result Value Ref Range Status   Specimen Description   Final    URINE, RANDOM Performed at [facility], [address] [place], [place] [address]    Special Requests   Final    NONE Performed at [facility], [address] [place], [place] [address]    Culture (A)  Final    <10,000 COLONIES/mL INSIGNIFICANT GROWTH Performed at [facility], [place] [address] [place], [place] [address]    Report Status 09/19/2019 FINAL  Final       Today   Subjective    Marvelle Span today has no new complaints No fever  Or chills   No Nausea, Vomiting or Diarrhea          Patient has been seen and examined prior to discharge   Objective   Blood pressure (!) 154/88, pulse 81, temperature 97.8 F (36.6 C), temperature source Oral, resp. rate 20, height 5\' 9"  (1.753 m), weight 71.7 kg, SpO2 98 %.   Intake/Output Summary (Last 24 hours) at 09/20/2019 1517 Last data filed at 09/20/2019 1300 Gross per 24 hour  Intake 720 ml  Output --  Net 720 ml   Exam Gen:- Awake Alert, no acute distress  HEENT:- [place].AT, No sclera icterus, right periorbital area ecchymosis resolving  Neck-Supple Neck,No JVD,.  Lungs-  CTAB , good air movement bilaterally  CV- S1,  S2 normal, regular Abd-  +ve B.Sounds, Abd Soft, No tenderness,    Extremity-improving edema edema,   good pulses Psych-affect is appropriate, oriented x3 Neuro-no new focal deficits, no tremors  Skin-ecchymosis and healing bruises   Data Review   CBC w Diff:  Lab Results  Component Value Date   WBC 12.4 (H) 09/19/2019   HGB 14.0 09/19/2019   HGB 14.8 09/04/2019   HCT 43.0 09/19/2019   HCT 43.5 09/04/2019   PLT 233 09/19/2019   PLT 264 09/04/2019   LYMPHOPCT 7 09/16/2019   MONOPCT 7 09/16/2019   EOSPCT 0 09/16/2019   BASOPCT 0 09/16/2019    CMP:  Lab Results  Component Value Date   NA 137 09/19/2019   NA 143 09/04/2019   K 4.0 09/19/2019   CL 97 (L) 09/19/2019   CO2 31 09/19/2019   BUN 29 (H) 09/19/2019   BUN 21 09/04/2019   CREATININE 1.55 (  H) 09/19/2019   CREATININE 1.18 (H) 10/13/2016   PROT 5.2 (L) 09/16/2019   PROT 6.2 04/11/2019   ALBUMIN 2.3 (L) 09/16/2019   ALBUMIN 3.9 04/11/2019   BILITOT 1.1 09/16/2019   BILITOT 0.5 04/11/2019   ALKPHOS 98 09/16/2019   AST 32 09/16/2019   ALT 26 09/16/2019  .   Total Discharge time is about 33 minutes  Roxan Hockey M.D on 09/20/2019 at 3:17 PM  Go to [email or web address] -  for contact info  [facility] - Office  [phone]

## 2019-09-20 NOTE — Discharge Instructions (Signed)
Avoid ibuprofen/Advil/Aleve/Motrin/Goody Powders/Naproxen/BC powders/Meloxicam/Diclofenac/Indomethacin and other Nonsteroidal anti-inflammatory medications as these will make you more likely to bleed and can cause stomach ulcers, can also cause Kidney problems.    --- Repeat CBC and BMP test within a week

## 2019-09-20 NOTE — Progress Notes (Signed)
IV removed, 2x2 gauze and paper tape applied to site, patient tolerated well.  Report called to Mariann Laster at [facility] and all questions answered.  Patient to be transported to facility by her daughter.

## 2019-09-20 NOTE — TOC Transition Note (Signed)
Transition of Care [facility]) - CM/SW Discharge Note   Patient Details  Name: ADONNA HORSLEY MRN: [id number] Date of Birth: August 16, 1933  Transition of Care [facility]) CM/SW Contact:  Davy Faught, Chauncey Reading, RN Phone Number: 09/20/2019, 4:48 PM   Clinical Narrative:   Covid test resulted. Spoke with Mariann Laster at [facility], offered to fax, she reports she already has Covid result. Bedside RN to call report. Family transporting. All DC clinicals sent via HUB.    Final next level of care: Skilled Nursing Facility Barriers to Discharge: Barriers Resolved   Patient Goals and CMS Choice Patient states their goals for this hospitalization and ongoing recovery are:: GO TO [place] CMS Medicare.gov Compare Post Acute Care list provided to:: Patient Choice offered to / list presented to : Patient  Discharge Placement              Patient [facility] bed at: [facility] Patient to be transferred to facility by: family Name of family member notified: [name] Patient and family notified of of transfer: 09/20/19  Discharge Plan and Services   Discharge Planning Services: CM Consult Post Acute Care Choice: Nursing Home                               Social Determinants of Health (SDOH) Interventions     Readmission Risk Interventions No flowsheet data found.

## 2019-09-20 NOTE — TOC Transition Note (Signed)
Transition of Care [facility]) - CM/SW Discharge Note   Patient Details  Name: Kelsey Sandoval MRN: [id number] Date of Birth: 1933-09-04  Transition of Care [facility]) CM/SW Contact:  [place], Chauncey Reading, RN Phone Number: 09/20/2019, 12:27 PM   Clinical Narrative:   Patient to be discharged to [facility] today. Family to transport. DC clinicals sent.      Final next level of care: Skilled Nursing Facility Barriers to Discharge: Barriers Resolved   Patient Goals and CMS Choice Patient states their goals for this hospitalization and ongoing recovery are:: GO TO [place] CMS Medicare.gov Compare Post Acute Care list provided to:: Patient Choice offered to / list presented to : Patient  Discharge Placement              Patient chooses bed at: [facility] Patient to be transferred to facility by: family Name of family member notified: [name] Patient and family notified of of transfer: 09/20/19  Discharge Plan and Services   Discharge Planning Services: CM Consult Post Acute Care Choice: Nursing Home                      Social Determinants of Health (SDOH) Interventions     Readmission Risk Interventions No flowsheet data found.

## 2019-09-20 NOTE — Care Management Important Message (Addendum)
Important Message  Patient Details  Name: Kelsey Sandoval MRN: [id number] Date of Birth: Aug 26, 1933   Medicare Important Message Given:  Yes(Daniel, RN will deliver to patient)  Letter was placed on patient's chart, Quillian Quince, RN will deliver to patient   Tommy Medal 09/20/2019, 3:17 PM

## 2019-09-20 NOTE — Care Management (Signed)
Collected: 09/20/19 1321  Result status: Final  Resulting lab: [facility]  Reference range: NEGATIVE  Value: NEGATIVE  Comment: (NOTE)  SARS-CoV-2 target nucleic acids are NOT DETECTED.  The SARS-CoV-2 RNA is generally detectable in upper respiratoy  specimens during the acute phase of infection. The lowest  concentration of SARS-CoV-2 viral copies this assay can detect is  131 copies/mL. A negative result does not preclude SARS-Cov-2  infection and should not be used as the sole basis for treatment or  other patient management decisions. A negative result may occur with  improper specimen collection/handling, submission of specimen other  than nasopharyngeal swab, presence of viral mutation(s) within the  areas targeted by this assay, and inadequate number of viral copies  (<131 copies/mL). A negative result must be combined with clinical  observations, patient history, and epidemiological information. The  expected result is Negative.  Fact Sheet for Patients:  PinkCheek.be  Fact Sheet for Healthcare Providers:  GravelBags.it  This test is not yet approved or cleared by the [place] FDA and  has been authorized for detection and/or diagnosis of SARS-CoV-2 by  FDA under an Emergency Use Authorization (EUA). This EUA will remain  in effect (meaning this test can be used) for the duration of the  COVID-19 declaration under Section 564(b)(1) of the Act, 21 U.S.C.  section 360bbb-3(b)(1), unless the authorization is terminated or  revoked sooner.   *Additional information available - comment  Respiratory Panel by RT PCR (Flu A&B, Covid) - Nasopharyngeal Swab (Order [id number]) Respiratory Panel by RT PCR (Flu A&B, Covid) - Nasopharyngeal Swab Order: [id number] Status:  Final result Visible to patient:  No (scheduled for 09/20/2019 5:15 PM) Next appt:  09/21/2019 at 01:10 PM in Family Medicine Sallee Lange, MD) Specimen Information: Nasopharyngeal Swab      Ref Range & Units 13:21  SARS Coronavirus 2 by RT PCR NEGATIVE NEGATIVE   Comment: (NOTE)  SARS-CoV-2 target nucleic acids are NOT DETECTED.  The SARS-CoV-2 RNA is generally detectable in upper respiratoy  specimens during the acute phase of infection. The lowest  concentration of SARS-CoV-2 viral copies this assay can detect is  131 copies/mL. A negative result does not preclude SARS-Cov-2  infection and should not be used as the sole basis for treatment or  other patient management decisions. A negative result may occur with  improper specimen collection/handling, submission of specimen other  than nasopharyngeal swab, presence of viral mutation(s) within the  areas targeted by this assay, and inadequate number of viral copies  (<131 copies/mL). A negative result must be combined with clinical  observations, patient history, and epidemiological information. The  expected result is Negative.  Fact Sheet for Patients:  PinkCheek.be  Fact Sheet for Healthcare Providers:  GravelBags.it  This test is not yet approved or cleared by the [place] FDA and  has been authorized for detection and/or diagnosis of SARS-CoV-2 by  FDA under an Emergency Use Authorization (EUA). This EUA will remain  in effect (meaning this test can be used) for the duration of the  COVID-19 declaration under Section 564(b)(1) of the Act, 21 U.S.C.  section 360bbb-3(b)(1), unless the authorization is terminated or  revoked sooner.   Influenza A by PCR NEGATIVE NEGATIVE   Influenza B by PCR NEGATIVE NEGATIVE   Comment: (NOTE)  The Xpert Xpress SARS-CoV-2/FLU/RSV assay is intended as an aid in  the diagnosis of influenza from Nasopharyngeal swab specimens and  should not be used as a sole basis  for treatment. Nasal washings and  aspirates are unacceptable for Xpert Xpress SARS-CoV-2/FLU/RSV   testing.  Fact Sheet for Patients:  PinkCheek.be  Fact Sheet for Healthcare Providers:  GravelBags.it  This test is not yet approved or cleared by the [place] FDA and  has been authorized for detection and/or diagnosis of SARS-CoV-2 by  FDA under an Emergency Use Authorization (EUA). This EUA will remain  in effect (meaning this test can be used) for the duration of the  Covid-19 declaration under Section 564(b)(1) of the Act, 21  U.S.C. section 360bbb-3(b)(1), unless the authorization is  terminated or revoked.  Performed at [facility], [address], [place], [place] [address]   Resulting Agency  [facility]      Specimen Collected: 09/20/19 13:21 Last Resulted: 09/20/19 16:15

## 2019-09-20 NOTE — Progress Notes (Signed)
Physical Therapy Treatment Patient Details Name: Kelsey Sandoval MRN: [id number] DOB: 06-23-1933 Today's Date: 09/20/2019    History of Present Illness Kelsey Sandoval is a 85 y.o. female with medical history significant of COPD not on home O2, chronic afib on pradaxa, chronic pain on chronic opioids, anxiety and depression, HTN who presented after a fall.    PT Comments    Pt sitting in chair, friendly and willing to participate with therapy.  Pt limited by Lt toe pain, possibly limited by gout per pt which was monitored through session.  Pt demonstrated safe mechanics with proper hand placement prior STS with initial cueing.  Increased distance with gait with no LOB episodes.  Cueing for posture and to increase stride length to normalize mechanics and energy conservation.  EOS pt left in chair with call bell within place and RN aware of status.  No reports of increased pain through session, pt was limited by fatigue through session stating she was tired multiple times though does report a good night sleep.    Follow Up Recommendations  SNF;Supervision - Intermittent;Supervision for mobility/OOB     Equipment Recommendations  None recommended by PT    Recommendations for Other Services       Precautions / Restrictions Precautions Precautions: Fall Restrictions Weight Bearing Restrictions: No    Mobility  Bed Mobility                  Transfers Overall transfer level: Needs assistance Equipment used: Rolling walker (2 wheeled) Transfers: Sit to/from Stand Sit to Stand: Min guard         General transfer comment: verbal cueing for hand placement for STS, good carryover  Ambulation/Gait Ambulation/Gait assistance: Min assist Gait Distance (Feet): 50 Feet Assistive device: Rolling walker (2 wheeled) Gait Pattern/deviations: Decreased step length - right;Decreased step length - left;Decreased stance time - left;Decreased stride length;Antalgic Gait velocity: slow   General Gait Details: increased endurance/distance for ambulation with less c/o pain in left great toe, demonstrating slow labored cadence without loss of balance   Stairs             Wheelchair Mobility    Modified Rankin (Stroke Patients Only)       Balance                                            Cognition Arousal/Alertness: Awake/alert Behavior During Therapy: WFL for tasks assessed/performed Overall Cognitive Status: Within Functional Limits for tasks assessed                                        Exercises General Exercises - Lower Extremity Long Arc Quad: Seated;AROM;Strengthening;Both;10 reps Hip Flexion/Marching: Seated;AROM;Strengthening;Both;10 reps Toe Raises: Seated;AROM;Strengthening;Both;10 reps Heel Raises: Seated;AROM;Strengthening;Both;10 reps    General Comments        Pertinent Vitals/Pain Pain Assessment: 0-10 Pain Score: 9  Pain Location: left foot medial side of great toe possibly due to gout per patient Pain Descriptors / Indicators: Guarding;Sore Pain Intervention(s): Monitored during session;Limited activity within patient's tolerance;Repositioned    Home Living                      Prior Function            PT Goals (current  goals can now be found in the care plan section)      Frequency    Min 3X/week      PT Plan Current plan remains appropriate    Co-evaluation              AM-PAC PT "6 Clicks" Mobility   Outcome Measure  Help needed turning from your back to your side while in a flat bed without using bedrails?: A Little Help needed moving from lying on your back to sitting on the side of a flat bed without using bedrails?: A Little Help needed moving to and from a bed to a chair (including a wheelchair)?: A Little Help needed standing up from a chair using your arms (e.g., wheelchair or bedside chair)?: A Little Help needed to walk in hospital room?: A  Little Help needed climbing 3-5 steps with a railing? : A Lot 6 Click Score: 17    End of Session Equipment Utilized During Treatment: Gait belt Activity Tolerance: Patient tolerated treatment well;Patient limited by fatigue Patient left: in chair;with call bell/phone within reach   PT Visit Diagnosis: Unsteadiness on feet (R26.81);Other abnormalities of gait and mobility (R26.89);Muscle weakness (generalized) (M62.81);Pain Pain - Right/Left: Left Pain - part of body: Ankle and joints of foot     Time: 1120-1148 PT Time Calculation (min) (ACUTE ONLY): 28 min  Charges:  $Gait Training: 8-22 mins $Therapeutic Exercise: 8-22 mins                     [address] [place]; [place]  Kelsey Sandoval 09/20/2019, 12:37 PM

## 2019-09-21 ENCOUNTER — Other Ambulatory Visit: Payer: Self-pay

## 2019-09-21 ENCOUNTER — Ambulatory Visit: Payer: PPO | Admitting: Family Medicine

## 2019-09-21 DIAGNOSIS — M7981 Nontraumatic hematoma of soft tissue: Secondary | ICD-10-CM | POA: Diagnosis not present

## 2019-09-21 DIAGNOSIS — F411 Generalized anxiety disorder: Secondary | ICD-10-CM | POA: Diagnosis not present

## 2019-09-21 DIAGNOSIS — I700 Atherosclerosis of aorta: Secondary | ICD-10-CM | POA: Diagnosis not present

## 2019-09-21 DIAGNOSIS — M545 Low back pain: Secondary | ICD-10-CM | POA: Diagnosis not present

## 2019-09-25 DIAGNOSIS — E785 Hyperlipidemia, unspecified: Secondary | ICD-10-CM | POA: Diagnosis not present

## 2019-09-25 DIAGNOSIS — R748 Abnormal levels of other serum enzymes: Secondary | ICD-10-CM | POA: Diagnosis not present

## 2019-09-25 DIAGNOSIS — Z780 Asymptomatic menopausal state: Secondary | ICD-10-CM | POA: Diagnosis not present

## 2019-09-25 DIAGNOSIS — R41841 Cognitive communication deficit: Secondary | ICD-10-CM | POA: Diagnosis not present

## 2019-09-25 DIAGNOSIS — J189 Pneumonia, unspecified organism: Secondary | ICD-10-CM | POA: Diagnosis not present

## 2019-09-25 DIAGNOSIS — J918 Pleural effusion in other conditions classified elsewhere: Secondary | ICD-10-CM | POA: Diagnosis not present

## 2019-09-25 DIAGNOSIS — K219 Gastro-esophageal reflux disease without esophagitis: Secondary | ICD-10-CM | POA: Diagnosis not present

## 2019-09-25 DIAGNOSIS — S0990XA Unspecified injury of head, initial encounter: Secondary | ICD-10-CM | POA: Diagnosis not present

## 2019-09-25 DIAGNOSIS — J90 Pleural effusion, not elsewhere classified: Secondary | ICD-10-CM | POA: Diagnosis not present

## 2019-09-25 DIAGNOSIS — Z7901 Long term (current) use of anticoagulants: Secondary | ICD-10-CM | POA: Diagnosis not present

## 2019-09-25 DIAGNOSIS — I10 Essential (primary) hypertension: Secondary | ICD-10-CM | POA: Diagnosis not present

## 2019-09-25 DIAGNOSIS — Z950 Presence of cardiac pacemaker: Secondary | ICD-10-CM | POA: Diagnosis not present

## 2019-09-25 DIAGNOSIS — F329 Major depressive disorder, single episode, unspecified: Secondary | ICD-10-CM | POA: Diagnosis not present

## 2019-09-25 DIAGNOSIS — Z7951 Long term (current) use of inhaled steroids: Secondary | ICD-10-CM | POA: Diagnosis not present

## 2019-09-25 DIAGNOSIS — Z7902 Long term (current) use of antithrombotics/antiplatelets: Secondary | ICD-10-CM | POA: Diagnosis not present

## 2019-09-25 DIAGNOSIS — Z9181 History of falling: Secondary | ICD-10-CM | POA: Diagnosis not present

## 2019-09-25 DIAGNOSIS — I130 Hypertensive heart and chronic kidney disease with heart failure and stage 1 through stage 4 chronic kidney disease, or unspecified chronic kidney disease: Secondary | ICD-10-CM | POA: Diagnosis not present

## 2019-09-25 DIAGNOSIS — Z1159 Encounter for screening for other viral diseases: Secondary | ICD-10-CM | POA: Diagnosis not present

## 2019-09-25 DIAGNOSIS — E039 Hypothyroidism, unspecified: Secondary | ICD-10-CM | POA: Diagnosis not present

## 2019-09-25 DIAGNOSIS — M5430 Sciatica, unspecified side: Secondary | ICD-10-CM | POA: Diagnosis not present

## 2019-09-25 DIAGNOSIS — J449 Chronic obstructive pulmonary disease, unspecified: Secondary | ICD-10-CM | POA: Diagnosis not present

## 2019-09-25 DIAGNOSIS — I160 Hypertensive urgency: Secondary | ICD-10-CM | POA: Diagnosis not present

## 2019-09-25 DIAGNOSIS — I482 Chronic atrial fibrillation, unspecified: Secondary | ICD-10-CM | POA: Diagnosis not present

## 2019-09-25 DIAGNOSIS — S299XXA Unspecified injury of thorax, initial encounter: Secondary | ICD-10-CM | POA: Diagnosis not present

## 2019-09-25 DIAGNOSIS — R296 Repeated falls: Secondary | ICD-10-CM | POA: Diagnosis not present

## 2019-09-25 DIAGNOSIS — Z20828 Contact with and (suspected) exposure to other viral communicable diseases: Secondary | ICD-10-CM | POA: Diagnosis not present

## 2019-09-25 DIAGNOSIS — I509 Heart failure, unspecified: Secondary | ICD-10-CM | POA: Diagnosis not present

## 2019-09-25 DIAGNOSIS — M6281 Muscle weakness (generalized): Secondary | ICD-10-CM | POA: Diagnosis not present

## 2019-09-25 DIAGNOSIS — R079 Chest pain, unspecified: Secondary | ICD-10-CM | POA: Diagnosis not present

## 2019-09-25 DIAGNOSIS — G629 Polyneuropathy, unspecified: Secondary | ICD-10-CM | POA: Diagnosis not present

## 2019-09-25 DIAGNOSIS — N183 Chronic kidney disease, stage 3 unspecified: Secondary | ICD-10-CM | POA: Diagnosis not present

## 2019-09-25 DIAGNOSIS — R2689 Other abnormalities of gait and mobility: Secondary | ICD-10-CM | POA: Diagnosis not present

## 2019-09-25 DIAGNOSIS — I480 Paroxysmal atrial fibrillation: Secondary | ICD-10-CM | POA: Diagnosis not present

## 2019-09-25 DIAGNOSIS — F411 Generalized anxiety disorder: Secondary | ICD-10-CM | POA: Diagnosis not present

## 2019-09-25 DIAGNOSIS — R0902 Hypoxemia: Secondary | ICD-10-CM | POA: Diagnosis not present

## 2019-09-25 DIAGNOSIS — S199XXA Unspecified injury of neck, initial encounter: Secondary | ICD-10-CM | POA: Diagnosis not present

## 2019-09-25 DIAGNOSIS — I5041 Acute combined systolic (congestive) and diastolic (congestive) heart failure: Secondary | ICD-10-CM | POA: Diagnosis not present

## 2019-09-25 DIAGNOSIS — Z7401 Bed confinement status: Secondary | ICD-10-CM | POA: Diagnosis not present

## 2019-09-25 DIAGNOSIS — I1310 Hypertensive heart and chronic kidney disease without heart failure, with stage 1 through stage 4 chronic kidney disease, or unspecified chronic kidney disease: Secondary | ICD-10-CM | POA: Diagnosis not present

## 2019-09-25 DIAGNOSIS — M8580 Other specified disorders of bone density and structure, unspecified site: Secondary | ICD-10-CM | POA: Diagnosis not present

## 2019-09-25 DIAGNOSIS — I5030 Unspecified diastolic (congestive) heart failure: Secondary | ICD-10-CM | POA: Diagnosis not present

## 2019-09-25 DIAGNOSIS — I700 Atherosclerosis of aorta: Secondary | ICD-10-CM | POA: Diagnosis not present

## 2019-09-26 DIAGNOSIS — I509 Heart failure, unspecified: Secondary | ICD-10-CM | POA: Diagnosis not present

## 2019-09-27 DIAGNOSIS — R296 Repeated falls: Secondary | ICD-10-CM | POA: Diagnosis not present

## 2019-09-27 DIAGNOSIS — J90 Pleural effusion, not elsewhere classified: Secondary | ICD-10-CM | POA: Diagnosis not present

## 2019-09-27 DIAGNOSIS — H5704 Mydriasis: Secondary | ICD-10-CM | POA: Diagnosis not present

## 2019-09-27 DIAGNOSIS — E038 Other specified hypothyroidism: Secondary | ICD-10-CM | POA: Diagnosis not present

## 2019-09-27 DIAGNOSIS — R270 Ataxia, unspecified: Secondary | ICD-10-CM | POA: Diagnosis not present

## 2019-09-27 DIAGNOSIS — I509 Heart failure, unspecified: Secondary | ICD-10-CM | POA: Diagnosis not present

## 2019-09-27 DIAGNOSIS — N1832 Chronic kidney disease, stage 3b: Secondary | ICD-10-CM | POA: Diagnosis not present

## 2019-09-27 DIAGNOSIS — Z791 Long term (current) use of non-steroidal anti-inflammatories (NSAID): Secondary | ICD-10-CM | POA: Diagnosis not present

## 2019-09-27 DIAGNOSIS — I480 Paroxysmal atrial fibrillation: Secondary | ICD-10-CM | POA: Diagnosis not present

## 2019-09-27 DIAGNOSIS — Z7901 Long term (current) use of anticoagulants: Secondary | ICD-10-CM | POA: Diagnosis not present

## 2019-09-27 DIAGNOSIS — Z9181 History of falling: Secondary | ICD-10-CM | POA: Diagnosis not present

## 2019-09-27 DIAGNOSIS — Z7401 Bed confinement status: Secondary | ICD-10-CM | POA: Diagnosis not present

## 2019-09-27 DIAGNOSIS — R0902 Hypoxemia: Secondary | ICD-10-CM | POA: Diagnosis not present

## 2019-09-27 DIAGNOSIS — J449 Chronic obstructive pulmonary disease, unspecified: Secondary | ICD-10-CM | POA: Diagnosis not present

## 2019-09-27 DIAGNOSIS — N183 Chronic kidney disease, stage 3 unspecified: Secondary | ICD-10-CM | POA: Diagnosis not present

## 2019-09-27 DIAGNOSIS — J439 Emphysema, unspecified: Secondary | ICD-10-CM | POA: Diagnosis not present

## 2019-09-27 DIAGNOSIS — M6281 Muscle weakness (generalized): Secondary | ICD-10-CM | POA: Diagnosis not present

## 2019-09-27 DIAGNOSIS — R41841 Cognitive communication deficit: Secondary | ICD-10-CM | POA: Diagnosis not present

## 2019-09-27 DIAGNOSIS — J918 Pleural effusion in other conditions classified elsewhere: Secondary | ICD-10-CM | POA: Diagnosis not present

## 2019-09-27 DIAGNOSIS — I5041 Acute combined systolic (congestive) and diastolic (congestive) heart failure: Secondary | ICD-10-CM | POA: Diagnosis not present

## 2019-09-27 DIAGNOSIS — I130 Hypertensive heart and chronic kidney disease with heart failure and stage 1 through stage 4 chronic kidney disease, or unspecified chronic kidney disease: Secondary | ICD-10-CM | POA: Diagnosis not present

## 2019-09-27 DIAGNOSIS — I482 Chronic atrial fibrillation, unspecified: Secondary | ICD-10-CM | POA: Diagnosis not present

## 2019-09-27 DIAGNOSIS — I1310 Hypertensive heart and chronic kidney disease without heart failure, with stage 1 through stage 4 chronic kidney disease, or unspecified chronic kidney disease: Secondary | ICD-10-CM | POA: Diagnosis not present

## 2019-09-27 DIAGNOSIS — R2689 Other abnormalities of gait and mobility: Secondary | ICD-10-CM | POA: Diagnosis not present

## 2019-09-27 DIAGNOSIS — Z79891 Long term (current) use of opiate analgesic: Secondary | ICD-10-CM | POA: Diagnosis not present

## 2019-09-27 DIAGNOSIS — G8929 Other chronic pain: Secondary | ICD-10-CM | POA: Diagnosis not present

## 2019-10-03 DIAGNOSIS — I480 Paroxysmal atrial fibrillation: Secondary | ICD-10-CM | POA: Diagnosis not present

## 2019-10-03 DIAGNOSIS — M6281 Muscle weakness (generalized): Secondary | ICD-10-CM | POA: Diagnosis not present

## 2019-10-03 DIAGNOSIS — J439 Emphysema, unspecified: Secondary | ICD-10-CM | POA: Diagnosis not present

## 2019-10-05 ENCOUNTER — Telehealth: Payer: Self-pay | Admitting: Family Medicine

## 2019-10-05 NOTE — Telephone Encounter (Signed)
Received fax form [place] stating that patient had been discharged on 09/27/2019 form [facility]. Provider is needing to know if patient is at home or in nursing home. Left message on daughter [name] voicemail to return call

## 2019-10-06 DIAGNOSIS — J439 Emphysema, unspecified: Secondary | ICD-10-CM | POA: Diagnosis not present

## 2019-10-06 DIAGNOSIS — I509 Heart failure, unspecified: Secondary | ICD-10-CM | POA: Diagnosis not present

## 2019-10-06 DIAGNOSIS — Z9181 History of falling: Secondary | ICD-10-CM | POA: Diagnosis not present

## 2019-10-06 DIAGNOSIS — I130 Hypertensive heart and chronic kidney disease with heart failure and stage 1 through stage 4 chronic kidney disease, or unspecified chronic kidney disease: Secondary | ICD-10-CM | POA: Diagnosis not present

## 2019-10-06 DIAGNOSIS — E038 Other specified hypothyroidism: Secondary | ICD-10-CM | POA: Diagnosis not present

## 2019-10-06 DIAGNOSIS — M6281 Muscle weakness (generalized): Secondary | ICD-10-CM | POA: Diagnosis not present

## 2019-10-06 DIAGNOSIS — Z79891 Long term (current) use of opiate analgesic: Secondary | ICD-10-CM | POA: Diagnosis not present

## 2019-10-06 DIAGNOSIS — H5704 Mydriasis: Secondary | ICD-10-CM | POA: Diagnosis not present

## 2019-10-06 DIAGNOSIS — N1832 Chronic kidney disease, stage 3b: Secondary | ICD-10-CM | POA: Diagnosis not present

## 2019-10-06 DIAGNOSIS — R270 Ataxia, unspecified: Secondary | ICD-10-CM | POA: Diagnosis not present

## 2019-10-06 DIAGNOSIS — I480 Paroxysmal atrial fibrillation: Secondary | ICD-10-CM | POA: Diagnosis not present

## 2019-10-06 DIAGNOSIS — Z791 Long term (current) use of non-steroidal anti-inflammatories (NSAID): Secondary | ICD-10-CM | POA: Diagnosis not present

## 2019-10-06 DIAGNOSIS — G8929 Other chronic pain: Secondary | ICD-10-CM | POA: Diagnosis not present

## 2019-10-10 ENCOUNTER — Telehealth: Payer: Self-pay | Admitting: Family Medicine

## 2019-10-10 NOTE — Telephone Encounter (Signed)
Pt daughter [name] contacted and verbalized understanding. Daughter is not sure if pt is getting out this Friday or next week, but daughter would appreciate a virtual or in office visit. Daughter will call back to set up that appt.

## 2019-10-10 NOTE — Telephone Encounter (Signed)
Pt daughter contacted to see if pt was home or still in nursing home. [name] states that pt is still at [facility]. Pt went to [facility] on 09/16/2019 and stayed for 4 days and after that was planning on going to nursing facility. Pt daughter [name] contacted the office and wanted to speak with provider about taking pt to nursing home. [name] states she is highly upset with how her message was handled. [name] states she wanted to speak to provider before 09/21/2019. [name] states that By that time, pt was going to be somewhere. [name] is wanting to speak directly to provider as soon as possible. Also, pt daughter concerned about [facility] not reaching out. [facility] [facility] did evaluation on 09/10/2019 and was suppose to have a visit on 09/16/2019 but that is when pt went to ER at [facility] states that patient will need new order (reinstatement orders)after coming home. Pt should be coming home Friday if all goes well. Please advise. Thank you.  [name] [phone]

## 2019-10-10 NOTE — Telephone Encounter (Signed)
Certainly my most sincere apologies. When I received that message on the 15th it was not my understanding that a phone call was desired on that day and it was my understanding that we would discuss further advice regarding rehab versus rest home versus going home on the 17th at a virtual visit with [name]. Obviously in retrospect on the 17th she had already been moved to [facility]  Once again please explain the above to the daughter with my apologies We will be happy to do a follow-up visit either virtual or in person when she comes home Thanks  If further concerns questions or issues please let me know

## 2019-10-16 ENCOUNTER — Telehealth: Payer: Self-pay | Admitting: Family Medicine

## 2019-10-16 DIAGNOSIS — R296 Repeated falls: Secondary | ICD-10-CM

## 2019-10-16 DIAGNOSIS — R270 Ataxia, unspecified: Secondary | ICD-10-CM

## 2019-10-16 NOTE — Telephone Encounter (Signed)
Go ahead with referral to requested home health care Diagnosis ataxia frequent falls

## 2019-10-16 NOTE — Telephone Encounter (Signed)
Referral put in and pt's daughter notified.

## 2019-10-16 NOTE — Telephone Encounter (Signed)
Patient daughter [name] would like orders to [facility] to be reinstated patient care . Patient will be leaving rehab on 1/13. Please advise

## 2019-10-20 ENCOUNTER — Telehealth: Payer: Self-pay | Admitting: Family Medicine

## 2019-10-20 DIAGNOSIS — Z20828 Contact with and (suspected) exposure to other viral communicable diseases: Secondary | ICD-10-CM | POA: Diagnosis not present

## 2019-10-20 NOTE — Telephone Encounter (Signed)
Please advise. Thank you

## 2019-10-20 NOTE — Telephone Encounter (Signed)
Please go ahead with verbal orders for this thank you

## 2019-10-20 NOTE — Telephone Encounter (Signed)
[phone], ok to leave on voicemail  Needs verbal orders for 2 x week for 3 week to work all fall prevention, strength and transfers and gait.

## 2019-10-20 NOTE — Telephone Encounter (Signed)
Verbal order given to Amy at [facility]

## 2019-10-23 ENCOUNTER — Telehealth: Payer: Self-pay | Admitting: Family Medicine

## 2019-10-23 DIAGNOSIS — M6281 Muscle weakness (generalized): Secondary | ICD-10-CM | POA: Diagnosis not present

## 2019-10-23 DIAGNOSIS — J439 Emphysema, unspecified: Secondary | ICD-10-CM | POA: Diagnosis not present

## 2019-10-23 DIAGNOSIS — I480 Paroxysmal atrial fibrillation: Secondary | ICD-10-CM | POA: Diagnosis not present

## 2019-10-23 NOTE — Telephone Encounter (Signed)
Kelsey Sandoval with [facility] calling requesting verbal orders for nursing 1 x 1 week, 2 x 2 weeks, and 1 x 1 week for CHF & COPD. Also she has three wounds on her leg they are monitoring and using xeroform on.   CB# [phone]

## 2019-10-23 NOTE — Telephone Encounter (Signed)
Verbal order given to Shannon at [facility] 

## 2019-10-23 NOTE — Telephone Encounter (Signed)
Please go ahead with verbal orders thank you 

## 2019-10-25 DIAGNOSIS — Z20828 Contact with and (suspected) exposure to other viral communicable diseases: Secondary | ICD-10-CM | POA: Diagnosis not present

## 2019-10-26 DIAGNOSIS — I10 Essential (primary) hypertension: Secondary | ICD-10-CM | POA: Diagnosis not present

## 2019-10-26 DIAGNOSIS — F339 Major depressive disorder, recurrent, unspecified: Secondary | ICD-10-CM | POA: Diagnosis not present

## 2019-10-26 DIAGNOSIS — E039 Hypothyroidism, unspecified: Secondary | ICD-10-CM | POA: Diagnosis not present

## 2019-10-26 DIAGNOSIS — E782 Mixed hyperlipidemia: Secondary | ICD-10-CM | POA: Diagnosis not present

## 2019-10-26 DIAGNOSIS — E539 Vitamin B deficiency, unspecified: Secondary | ICD-10-CM | POA: Diagnosis not present

## 2019-10-26 DIAGNOSIS — I509 Heart failure, unspecified: Secondary | ICD-10-CM | POA: Diagnosis not present

## 2019-10-26 DIAGNOSIS — F419 Anxiety disorder, unspecified: Secondary | ICD-10-CM | POA: Diagnosis not present

## 2019-10-26 DIAGNOSIS — K219 Gastro-esophageal reflux disease without esophagitis: Secondary | ICD-10-CM | POA: Diagnosis not present

## 2019-10-26 DIAGNOSIS — G9009 Other idiopathic peripheral autonomic neuropathy: Secondary | ICD-10-CM | POA: Diagnosis not present

## 2019-10-26 DIAGNOSIS — G629 Polyneuropathy, unspecified: Secondary | ICD-10-CM | POA: Diagnosis not present

## 2019-10-26 DIAGNOSIS — G894 Chronic pain syndrome: Secondary | ICD-10-CM | POA: Diagnosis not present

## 2019-10-26 DIAGNOSIS — I482 Chronic atrial fibrillation, unspecified: Secondary | ICD-10-CM | POA: Diagnosis not present

## 2019-10-26 DIAGNOSIS — F411 Generalized anxiety disorder: Secondary | ICD-10-CM | POA: Diagnosis not present

## 2019-10-26 DIAGNOSIS — J449 Chronic obstructive pulmonary disease, unspecified: Secondary | ICD-10-CM | POA: Diagnosis not present

## 2019-10-30 ENCOUNTER — Other Ambulatory Visit: Payer: Self-pay

## 2019-10-30 ENCOUNTER — Encounter: Payer: PPO | Admitting: Family Medicine

## 2019-10-30 DIAGNOSIS — I480 Paroxysmal atrial fibrillation: Secondary | ICD-10-CM | POA: Diagnosis not present

## 2019-10-30 DIAGNOSIS — M6281 Muscle weakness (generalized): Secondary | ICD-10-CM | POA: Diagnosis not present

## 2019-10-30 DIAGNOSIS — J439 Emphysema, unspecified: Secondary | ICD-10-CM | POA: Diagnosis not present

## 2019-10-31 ENCOUNTER — Telehealth: Payer: Self-pay | Admitting: Family Medicine

## 2019-10-31 NOTE — Telephone Encounter (Signed)
Left detailed message on secured voicemail with verbal orders

## 2019-10-31 NOTE — Telephone Encounter (Signed)
May have verbal 

## 2019-10-31 NOTE — Telephone Encounter (Signed)
Kat OT with [facility] calling for verbal orders for OT 2 x 1 week and 1 x 1 week.   CB# [phone]

## 2019-10-31 NOTE — Telephone Encounter (Signed)
Pt's daughter dropped off Application for Renewal of [employer] (in green folder)   Please call daughter when ready to pick up

## 2019-10-31 NOTE — Telephone Encounter (Signed)
Please advise. Thank you

## 2019-11-01 DIAGNOSIS — Z03818 Encounter for observation for suspected exposure to other biological agents ruled out: Secondary | ICD-10-CM | POA: Diagnosis not present

## 2019-11-02 DIAGNOSIS — G9009 Other idiopathic peripheral autonomic neuropathy: Secondary | ICD-10-CM | POA: Diagnosis not present

## 2019-11-02 DIAGNOSIS — G894 Chronic pain syndrome: Secondary | ICD-10-CM | POA: Diagnosis not present

## 2019-11-02 NOTE — Progress Notes (Signed)
This encounter was created in error - please disregard.

## 2019-11-02 NOTE — Telephone Encounter (Signed)
Form was filled out thanks

## 2019-11-03 ENCOUNTER — Telehealth: Payer: Self-pay | Admitting: Family Medicine

## 2019-11-03 DIAGNOSIS — I509 Heart failure, unspecified: Secondary | ICD-10-CM | POA: Diagnosis not present

## 2019-11-03 DIAGNOSIS — Z9181 History of falling: Secondary | ICD-10-CM | POA: Diagnosis not present

## 2019-11-03 DIAGNOSIS — Z79891 Long term (current) use of opiate analgesic: Secondary | ICD-10-CM | POA: Diagnosis not present

## 2019-11-03 DIAGNOSIS — I130 Hypertensive heart and chronic kidney disease with heart failure and stage 1 through stage 4 chronic kidney disease, or unspecified chronic kidney disease: Secondary | ICD-10-CM | POA: Diagnosis not present

## 2019-11-03 DIAGNOSIS — Z791 Long term (current) use of non-steroidal anti-inflammatories (NSAID): Secondary | ICD-10-CM | POA: Diagnosis not present

## 2019-11-03 DIAGNOSIS — E038 Other specified hypothyroidism: Secondary | ICD-10-CM | POA: Diagnosis not present

## 2019-11-03 DIAGNOSIS — H5704 Mydriasis: Secondary | ICD-10-CM | POA: Diagnosis not present

## 2019-11-03 DIAGNOSIS — G8929 Other chronic pain: Secondary | ICD-10-CM | POA: Diagnosis not present

## 2019-11-03 DIAGNOSIS — J439 Emphysema, unspecified: Secondary | ICD-10-CM | POA: Diagnosis not present

## 2019-11-03 DIAGNOSIS — I480 Paroxysmal atrial fibrillation: Secondary | ICD-10-CM | POA: Diagnosis not present

## 2019-11-03 DIAGNOSIS — N1832 Chronic kidney disease, stage 3b: Secondary | ICD-10-CM | POA: Diagnosis not present

## 2019-11-03 DIAGNOSIS — R270 Ataxia, unspecified: Secondary | ICD-10-CM | POA: Diagnosis not present

## 2019-11-03 DIAGNOSIS — M6281 Muscle weakness (generalized): Secondary | ICD-10-CM | POA: Diagnosis not present

## 2019-11-03 NOTE — Telephone Encounter (Signed)
Please advise. Thank you

## 2019-11-03 NOTE — Telephone Encounter (Signed)
Amy PT with [facility] requesting verbal order to extend PT 2 x 4 weeks.   CB# [phone]   Ok to lvm

## 2019-11-03 NOTE — Telephone Encounter (Signed)
That would be fine thank you

## 2019-11-03 NOTE — Telephone Encounter (Signed)
Left orders on Amy secure voicemail

## 2019-11-06 DIAGNOSIS — I509 Heart failure, unspecified: Secondary | ICD-10-CM | POA: Diagnosis not present

## 2019-11-06 DIAGNOSIS — J439 Emphysema, unspecified: Secondary | ICD-10-CM | POA: Diagnosis not present

## 2019-11-06 DIAGNOSIS — M6281 Muscle weakness (generalized): Secondary | ICD-10-CM | POA: Diagnosis not present

## 2019-11-06 DIAGNOSIS — Z791 Long term (current) use of non-steroidal anti-inflammatories (NSAID): Secondary | ICD-10-CM | POA: Diagnosis not present

## 2019-11-06 DIAGNOSIS — H5704 Mydriasis: Secondary | ICD-10-CM | POA: Diagnosis not present

## 2019-11-06 DIAGNOSIS — Z9181 History of falling: Secondary | ICD-10-CM | POA: Diagnosis not present

## 2019-11-06 DIAGNOSIS — I480 Paroxysmal atrial fibrillation: Secondary | ICD-10-CM | POA: Diagnosis not present

## 2019-11-06 DIAGNOSIS — N1832 Chronic kidney disease, stage 3b: Secondary | ICD-10-CM | POA: Diagnosis not present

## 2019-11-06 DIAGNOSIS — G8929 Other chronic pain: Secondary | ICD-10-CM | POA: Diagnosis not present

## 2019-11-06 DIAGNOSIS — Z79891 Long term (current) use of opiate analgesic: Secondary | ICD-10-CM | POA: Diagnosis not present

## 2019-11-06 DIAGNOSIS — F419 Anxiety disorder, unspecified: Secondary | ICD-10-CM | POA: Diagnosis not present

## 2019-11-06 DIAGNOSIS — E038 Other specified hypothyroidism: Secondary | ICD-10-CM | POA: Diagnosis not present

## 2019-11-06 DIAGNOSIS — F339 Major depressive disorder, recurrent, unspecified: Secondary | ICD-10-CM | POA: Diagnosis not present

## 2019-11-06 DIAGNOSIS — I130 Hypertensive heart and chronic kidney disease with heart failure and stage 1 through stage 4 chronic kidney disease, or unspecified chronic kidney disease: Secondary | ICD-10-CM | POA: Diagnosis not present

## 2019-11-06 DIAGNOSIS — R270 Ataxia, unspecified: Secondary | ICD-10-CM | POA: Diagnosis not present

## 2019-11-06 NOTE — Telephone Encounter (Signed)
Daughter pickup form today

## 2019-11-07 ENCOUNTER — Telehealth: Payer: Self-pay | Admitting: Family Medicine

## 2019-11-07 NOTE — Telephone Encounter (Signed)
Kat OT with [facility] calling requesting continuing services for OT. 1 x 1 week and 2 x 4 weeks.   CB# [phone]. Ok to lvm secure line

## 2019-11-07 NOTE — Telephone Encounter (Signed)
[facility](Amy)needing verbal orders for physical therapy twice a week for 4 weeks. [phone]

## 2019-11-07 NOTE — Telephone Encounter (Signed)
That would be fine go ahead and extend that thank you

## 2019-11-07 NOTE — Telephone Encounter (Signed)
Kat notified on voicemail.

## 2019-11-07 NOTE — Telephone Encounter (Signed)
Please do so

## 2019-11-07 NOTE — Telephone Encounter (Signed)
Verbal orders given to amy

## 2019-11-08 DIAGNOSIS — J90 Pleural effusion, not elsewhere classified: Secondary | ICD-10-CM | POA: Diagnosis not present

## 2019-11-08 DIAGNOSIS — Z03818 Encounter for observation for suspected exposure to other biological agents ruled out: Secondary | ICD-10-CM | POA: Diagnosis not present

## 2019-11-08 DIAGNOSIS — J449 Chronic obstructive pulmonary disease, unspecified: Secondary | ICD-10-CM | POA: Diagnosis not present

## 2019-11-09 DIAGNOSIS — I700 Atherosclerosis of aorta: Secondary | ICD-10-CM | POA: Diagnosis not present

## 2019-11-09 DIAGNOSIS — H5704 Mydriasis: Secondary | ICD-10-CM | POA: Diagnosis not present

## 2019-11-09 DIAGNOSIS — G8929 Other chronic pain: Secondary | ICD-10-CM | POA: Diagnosis not present

## 2019-11-09 DIAGNOSIS — K219 Gastro-esophageal reflux disease without esophagitis: Secondary | ICD-10-CM | POA: Diagnosis not present

## 2019-11-09 DIAGNOSIS — G629 Polyneuropathy, unspecified: Secondary | ICD-10-CM | POA: Diagnosis not present

## 2019-11-09 DIAGNOSIS — J439 Emphysema, unspecified: Secondary | ICD-10-CM | POA: Diagnosis not present

## 2019-11-09 DIAGNOSIS — N1832 Chronic kidney disease, stage 3b: Secondary | ICD-10-CM | POA: Diagnosis not present

## 2019-11-09 DIAGNOSIS — F411 Generalized anxiety disorder: Secondary | ICD-10-CM | POA: Diagnosis not present

## 2019-11-09 DIAGNOSIS — I5031 Acute diastolic (congestive) heart failure: Secondary | ICD-10-CM | POA: Diagnosis not present

## 2019-11-09 DIAGNOSIS — M5430 Sciatica, unspecified side: Secondary | ICD-10-CM | POA: Diagnosis not present

## 2019-11-09 DIAGNOSIS — E038 Other specified hypothyroidism: Secondary | ICD-10-CM | POA: Diagnosis not present

## 2019-11-09 DIAGNOSIS — I130 Hypertensive heart and chronic kidney disease with heart failure and stage 1 through stage 4 chronic kidney disease, or unspecified chronic kidney disease: Secondary | ICD-10-CM | POA: Diagnosis not present

## 2019-11-09 DIAGNOSIS — I480 Paroxysmal atrial fibrillation: Secondary | ICD-10-CM | POA: Diagnosis not present

## 2019-11-13 DIAGNOSIS — R0602 Shortness of breath: Secondary | ICD-10-CM | POA: Diagnosis not present

## 2019-11-13 DIAGNOSIS — Z03818 Encounter for observation for suspected exposure to other biological agents ruled out: Secondary | ICD-10-CM | POA: Diagnosis not present

## 2019-11-15 DIAGNOSIS — Z03818 Encounter for observation for suspected exposure to other biological agents ruled out: Secondary | ICD-10-CM | POA: Diagnosis not present

## 2019-11-19 DIAGNOSIS — I5041 Acute combined systolic (congestive) and diastolic (congestive) heart failure: Secondary | ICD-10-CM | POA: Diagnosis not present

## 2019-11-19 DIAGNOSIS — I480 Paroxysmal atrial fibrillation: Secondary | ICD-10-CM | POA: Diagnosis not present

## 2019-11-19 DIAGNOSIS — J90 Pleural effusion, not elsewhere classified: Secondary | ICD-10-CM | POA: Diagnosis not present

## 2019-11-20 ENCOUNTER — Telehealth: Payer: Self-pay

## 2019-11-20 DIAGNOSIS — Z03818 Encounter for observation for suspected exposure to other biological agents ruled out: Secondary | ICD-10-CM | POA: Diagnosis not present

## 2019-11-20 DIAGNOSIS — F339 Major depressive disorder, recurrent, unspecified: Secondary | ICD-10-CM | POA: Diagnosis not present

## 2019-11-20 DIAGNOSIS — F419 Anxiety disorder, unspecified: Secondary | ICD-10-CM | POA: Diagnosis not present

## 2019-11-20 NOTE — Progress Notes (Signed)
.    Virtual Visit via Telephone Note   This visit type was conducted due to national recommendations for restrictions regarding the COVID-19 Pandemic (e.g. social distancing) in an effort to limit this patient's exposure and mitigate transmission in our community.  Due to her co-morbid illnesses, this patient is at least at moderate risk for complications without adequate follow up.  This format is felt to be most appropriate for this patient at this time.  The patient did not have access to video technology/had technical difficulties with video requiring transitioning to audio format only (telephone).  All issues noted in this document were discussed and addressed.  No physical exam could be performed with this format.  Please refer to the patient's chart for her  consent to telehealth for [facility].   Date:  11/21/2019   ID:  Kelsey Sandoval, DOB 08/01/1933, MRN [id number]  Patient Location: Other:  Assisted Living Provider Location: Office  PCP:  Kathyrn Drown, MD  Cardiologist:  Kate Sable, MD  Electrophysiologist:  Cristopher Peru, MD   Evaluation Performed:  Follow-Up Visit  Chief Complaint:  Dyspnea  History of Present Illness:    Kelsey Sandoval is a 86 y.o. female with past medical history of permanentatrial fibrillation (on Pradaxa for anticoagulation),chronic diastolic CHF,tachy-brady syndrome (s/p Medtronic PPM placement),mitral regurgitation,HTN, HLD, and Stage 3 CKD who presents for a 12-month follow-up telehealth visit.   She was last examined by Dr. Lovena Le in 08/2019 and denied any recent chest pain or dyspnea on exertion at that time. She developed worsening edema with Amlodipine and this was discontinued at the time of her visit. Her device was interrogated at that time and functioning normally.  In the interim, she was admitted to [facility] in 09/2019 for evaluation after a fall. BP was significantly elevated at 198/122. CT Head showed no acute intracranial  abnormalities but she did have a hematoma over the right orbital rim. Her frequent falls were thought to be secondary to polypharmacy and several of medications including opiates and benzodiazepines were reduced to avoid oversedation. In regards to her BP, Hydralazine was increased to 50 mg 3 times daily and Lisinopril was increased to 20 mg daily with her remaining on Toprol-XL 50mg  BID and Torsemide 40mg  daily.   In talking with the patient's care coordinator, she reports the patient has been at [facility] for over 3 weeks with the plan of remaining for at least another 3 weeks for additional therapy.  She reports they contacted our office and had received a message from Dr. Lovena Le several weeks ago to increase Torsemide for a few days but there are no notes in EPIC about this.  She reports a CXR had been obtained and showed vascular congestion and bilateral effusions. The patient's Torsemide was titrated to 40mg  in AM/20mg  in PM for several days with instructions to reduce back to 20mg  BID. Her weight was at 147 lbs at the time of the admission to [facility] on 10/19/2019 and had trended up to 153 lbs. At 152 lbs on most recent check.  Lab work on 11/09/2019 showed BNP elevated to 753 with creatinine at 1.96 and K+ 3.5.  In talking with the patient, she reports having baseline dyspnea at rest and with activity. She is on 2 L nasal cannula but her nurse reports she typically is wearing this incorrectly or not wearing it at all. She denies any specific lower extremity edema or PND. She does sleep with 2-3 pillows at baseline. She reports  some "soreness" in her chest with coughing but denies any exertional chest pain or palpitations.  She complains about "hearing things at night" but upon further elaboration she mentions her oxygen concentrator along with her neighbor's radio keeps her up at night.  The patient does not have symptoms concerning for COVID-19 infection (fever, chills, cough, or new shortness of  breath).    Past Medical History:  Diagnosis Date  . Anxiety   . Aortic atherosclerosis ([place]) 10/10/2018  . Arthritis   . Back pain, chronic    Sciatica  . Cataracts, bilateral   . COPD (chronic obstructive pulmonary disease) ([place]) 10/10/2018  . Depression   . Dysrhythmia    AFib  . Essential hypertension   . GERD (gastroesophageal reflux disease)   . Hyperlipidemia   . Hypothyroidism   . Membranous nephropathy determined by biopsy 06/07/2015  . Nephrotic syndrome    RHUX  . Neuropathy   . Osteopenia   . Pacemaker    Medtronic  . PAF (paroxysmal atrial fibrillation) ([place])   . Pre-diabetes   . Tachycardia-bradycardia syndrome [facility])    Past Surgical History:  Procedure Laterality Date  . ABDOMINAL HYSTERECTOMY     partial-pt has no ovaries  . ANTERIOR VITRECTOMY Right 03/11/2018   Procedure: ANTERIOR VITRECTOMY;  Surgeon: Baruch Goldmann, MD;  Location: [facility] ORS;  Service: Ophthalmology;  Laterality: Right;  . APPENDECTOMY    . BACK SURGERY    . BIOPSY N/A 01/17/2014   Procedure: BIOPSY;  Surgeon: Rogene Houston, MD;  Location: [facility] ENDO SUITE;  Service: Endoscopy;  Laterality: N/A;  . CATARACT EXTRACTION W/PHACO Left 02/28/2015   Procedure: CATARACT EXTRACTION PHACO AND INTRAOCULAR LENS PLACEMENT (IOC);  Surgeon: [name], MD;  Location: [facility] ORS;  Service: Ophthalmology;  Laterality: Left;  CDE 18.71  . CATARACT EXTRACTION W/PHACO Right 03/11/2018   Procedure: CATARACT EXTRACTION PHACO  AND INTRAOCULAR LENS PLACEMENT RIGHT EYE ;  Surgeon: Baruch Goldmann, MD;  Location: [facility] ORS;  Service: Ophthalmology;  Laterality: Right;  CDE: 19.39  . COLONOSCOPY  9/05  . COLONOSCOPY N/A 10/11/2013   Procedure: COLONOSCOPY;  Surgeon: Rogene Houston, MD;  Location: [facility] ENDO SUITE;  Service: Endoscopy;  Laterality: N/A;  1200  . ESOPHAGOGASTRODUODENOSCOPY N/A 01/17/2014   Procedure: ESOPHAGOGASTRODUODENOSCOPY (EGD);  Surgeon: Rogene Houston, MD;  Location: [facility] END[facility];  Service: Endoscopy;  Laterality:  N/A;  230  . ESOPHAGOGASTRODUODENOSCOPY N/A 01/02/2016   Procedure: ESOPHAGOGASTRODUODENOSCOPY (EGD);  Surgeon: Rogene Houston, MD;  Location: [facility] END[facility] SUITE;  Service: Endoscopy;  Laterality: N/A;  240  . INSERT / REPLACE / REMOVE PACEMAKER     2012  . Wisdom tooth extracton       Current Meds  Medication Sig  . ALPRAZolam (XANAX) 0.25 MG tablet Take 1 tablet (0.25 mg total) by mouth 2 (two) times daily as needed for anxiety or sleep.  . budesonide-formoterol (SYMBICORT) 160-4.5 MCG/ACT inhaler Inhale 2 puffs into the lungs 2 (two) times daily.  . dabigatran (PRADAXA) 75 MG CAPS capsule Take 1 capsule (75 mg total) by mouth 2 (two) times daily.  . DULoxetine (CYMBALTA) 60 MG capsule Take 1 capsule (60 mg total) by mouth daily.  Marland Kitchen gabapentin (NEURONTIN) 300 MG capsule TAKE 1 CAPSULE BY MOUTH THREE TIMES DAILY (Patient taking differently: Take 300 mg by mouth 4 (four) times daily. )  . HYDROcodone-acetaminophen (NORCO/VICODIN) 5-325 MG tablet Take 1 tablet by mouth every 6 (six) hours as needed for moderate pain or severe pain.  Marland Kitchen  levothyroxine (SYNTHROID) 50 MCG tablet TAKE 1 TABLET BY MOUTH ONCE DAILY BEFORE BREAKFAST (Patient taking differently: 75 mcg. TAKE 1 TABLET BY MOUTH ONCE DAILY BEFORE BREAKFAST)  . lisinopril (ZESTRIL) 20 MG tablet Take 1 tablet (20 mg total) by mouth daily.  . metoprolol tartrate (LOPRESSOR) 100 MG tablet Take 100 mg by mouth 2 (two) times daily.  . ondansetron (ZOFRAN) 8 MG tablet Take 1 tablet (8 mg total) by mouth every 8 (eight) hours as needed for nausea.  . pantoprazole (PROTONIX) 40 MG tablet Take 1 tablet (40 mg total) by mouth daily.  Marland Kitchen PARoxetine (PAXIL) 10 MG tablet Take 10 mg by mouth daily.  . pravastatin (PRAVACHOL) 80 MG tablet Take 1 tablet (80 mg total) by mouth every evening.  Marland Kitchen PROAIR HFA 108 (90 Base) MCG/ACT inhaler INHALE 2 PUFFS INTO LUNGS EVERY 6 HOURS AS NEEDED FOR WHEEZING OR SHORTNESS OF BREATH  . torsemide (DEMADEX) 20 MG tablet Take 2  tablets (40 mg total) by mouth daily.  . vitamin B-12 (CYANOCOBALAMIN) 1000 MCG tablet Take 1 tablet (1,000 mcg total) by mouth daily. (Patient taking differently: Take 1,000 mcg by mouth every morning. )  . [DISCONTINUED] PRADAXA 75 MG CAPS capsule Take 1 capsule by mouth twice daily     Allergies:   Penicillins, Levaquin [levofloxacin], Sulfa antibiotics, and Zithromax [azithromycin]   Social History   Tobacco Use  . Smoking status: Never Smoker  . Smokeless tobacco: Never Used  Substance Use Topics  . Alcohol use: No    Alcohol/week: 0.0 standard drinks  . Drug use: No     Family Hx: The patient's family history includes Anxiety disorder in her sister and sister; Colon cancer in her brother; Depression in her sister and sister.  ROS:   Please see the history of present illness.     All other systems reviewed and are negative.   Prior CV studies:   The following studies were reviewed today:  Echocardiogram: 10/2018 Study Conclusions   - Left ventricle: The cavity size was normal. Wall thickness was  increased increased in a pattern of mild to moderate LVH.  Systolic function was normal. The estimated ejection fraction was  in the range of 60% to 65%. Wall motion was normal; there were no  regional wall motion abnormalities. The study is not technically  sufficient to allow evaluation of LV diastolic function.  - Aortic valve: There was mild regurgitation. Valve area (VTI):  2.41 cm^2. Valve area (Vmax): 2.13 cm^2.  - Left atrium: The atrium was severely dilated.  - Right ventricle: The cavity size was mildly dilated.  - Right atrium: The atrium was moderately dilated.   Labs/Other Tests and Data Reviewed:    EKG:  An ECG dated 09/16/2019 was personally reviewed today and demonstrated:  rate-controlled atrial fibrillation, HR 83. No acute ST abnormalities when compared to prior tracings.   Recent Labs: 04/11/2019: TSH 1.930 09/04/2019: BNP 627.7  09/16/2019: ALT 26 09/19/2019: BUN 29; Creatinine, Ser 1.55; Hemoglobin 14.0; Magnesium 1.9; Platelets 233; Potassium 4.0; Sodium 137   Recent Lipid Panel Lab Results  Component Value Date/Time   CHOL 187 11/25/2015 09:55 AM   TRIG 209 (H) 11/25/2015 09:55 AM   HDL 58 11/25/2015 09:55 AM   CHOLHDL 3.2 11/25/2015 09:55 AM   CHOLHDL 3.6 10/08/2014 09:40 AM   LDLCALC 87 11/25/2015 09:55 AM    Wt Readings from Last 3 Encounters:  11/21/19 152 lb (68.9 kg)  09/16/19 158 lb 1.1 oz (71.7 kg)  08/22/19 161 lb (73 kg)     Objective:    Vital Signs:  BP 138/74   Pulse 72   Temp 97.6 F (36.4 C)   Wt 152 lb (68.9 kg)   BMI 22.45 kg/m    General: Pleasant female sounding in NAD Psych: Normal affect. Neuro: Alert and oriented X 3.  Lungs:  Resp regular and unlabored while talking on the phone.   ASSESSMENT & PLAN:    1. Chronic Diastolic CHF - She has experienced worsening dyspnea on exertion and orthopnea for the past few weeks and weight had increased by 6 lbs since her arrival to ALF on 10/19/2019. Recent CXR showed vascular congestion and bilateral effusions per the verbal report from her caretaker. BNP was also elevated to 753 and Torsemide was titrated by Dr. Lovena Le for several days although the note stating this is not visible in EPIC but they state they received a faxed sheet with this information. - She is now on Torsemide 20 mg BID. In talking with the patient's nurse, she does not have any lower extremity edema but reports dyspnea as outlined above. There is concern about compliance with her oxygen supplementation. Will plan to obtain a repeat BNP and BMET for follow-up. Medication changes pending repeat labs.   2. Permanent Atrial Fibrillation - Rates have been well controlled. She was previously on Toprol-XL 50 mg twice daily but is now on Lopressor 100 mg twice daily following changes made at [facility]. Will continue at current dosing for now given well-controlled HR  and BP.  - She is on Pradaxa for anticoagulation but for unclear reasons this was reduced to once daily dosing. Will restart at her prior dosing of Pradaxa 75 mg twice daily to protect against a thromboembolic event. If lab work unrevealing for fluid overload or her dyspnea does not improve, would have a low threshold to work-up for a PE given her decreased anticoagulation dosing. CTA not ideal given her renal function so would have to consider D-dimer than VQ Scan.   3. Tachy-brady Syndrome - s/p Medtronic PPM placement. Followed by Dr. Lovena Le and most recent device interrogation in 05/2019 showed normal device function.  4. Aortic Regurgitation - Mild by most recent echocardiogram in 10/2018.  Will continue to follow.  5. HTN - BP improved to 138/74 on most recent check. She remains on Lisinopril 20 mg daily and Lopressor 100 mg twice daily. She is no longer on Hydralazine by review of her MAR. Would recommend restarting this if BP became elevated in the future.  6. Stage 3 CKD - baseline creatinine 1.4 - 1.5. Elevated to 1. 96 on 11/09/2019. Will recheck BMET.    COVID-19 Education: The signs and symptoms of COVID-19 were discussed with the patient and how to seek care for testing (follow up with PCP or arrange E-visit). The importance of social distancing was discussed today.  Time:   Today, I have spent 26 minutes with the patient/patient's caretaker with telehealth technology discussing the above problems.     Medication Adjustments/Labs and Tests Ordered: Current medicines are reviewed at length with the patient today.  Concerns regarding medicines are outlined above.   Tests Ordered: Orders Placed This Encounter  Procedures  . Basic Metabolic Panel (BMET)  . B Nat Peptide    Medication Changes: Meds ordered this encounter  Medications  . dabigatran (PRADAXA) 75 MG CAPS capsule    Sig: Take 1 capsule (75 mg total) by mouth 2 (two) times daily.  Dispense:  180 capsule     Refill:  3    Follow Up:  Virtual Visit  in 3 week(s)  Signed, Erma Heritage, PA-C  11/21/2019 5:25 PM    [facility]

## 2019-11-20 NOTE — Telephone Encounter (Signed)
Virtual Visit Pre-Appointment Phone Call  "(Name), I am calling you today to discuss your upcoming appointment. We are currently trying to limit exposure to the virus that causes COVID-19 by seeing patients at home rather than in the office."  1. "What is the BEST phone number to call the day of the visit?" - include this in appointment notes  2. "Do you have or have access to (through a family member/friend) a smartphone with video capability that we can use for your visit?" a. If yes - list this number in appt notes as "cell" (if different from BEST phone #) and list the appointment type as a VIDEO visit in appointment notes b. If no - list the appointment type as a PHONE visit in appointment notes  Confirm consent - "In the setting of the current Covid19 crisis, you are scheduled for a (phone or video) visit with your provider on (date) at (time).  Just as we do with many in-office visits, in order for you to participate in this visit, we must obtain consent.  If you'd like, I can send this to your mychart (if signed up) or email for you to review.  Otherwise, I can obtain your verbal consent now.  All virtual visits are billed to your insurance company just like a normal visit would be.  By agreeing to a virtual visit, we'd like you to understand that the technology does not allow for your provider to perform an examination, and thus may limit your provider's ability to fully assess your condition. If your provider identifies any concerns that need to be evaluated in person, we will make arrangements to do so.  Finally, though the technology is pretty good, we cannot assure that it will always work on either your or our end, and in the setting of a video visit, we may have to convert it to a phone-only visit.  In either situation, we cannot ensure that we have a secure connection.  Are you willing to proceed?" STAFF: Did the patient verbally acknowledge consent to telehealth visit? Document  YES/NO here:  3. Advise patient to be prepared - "Two hours prior to your appointment, go ahead and check your blood pressure, pulse, oxygen saturation, and your weight (if you have the equipment to check those) and write them all down. When your visit starts, your provider will ask you for this information. If you have an Apple Watch or Kardia device, please plan to have heart rate information ready on the day of your appointment. Please have a pen and paper handy nearby the day of the visit as well."  4. Give patient instructions for MyChart download to smartphone OR [email or web address] as below if video visit (depending on what platform provider is using)  5. Inform patient they will receive a phone call 15 minutes prior to their appointment time (may be from unknown caller ID) so they should be prepared to answer    TELEPHONE CALL NOTE  JAYDE MCALLISTER has been deemed a candidate for a follow-up tele-health visit to limit community exposure during the Covid-19 pandemic. I spoke with the patient via phone to ensure availability of phone/video source, confirm preferred email & phone number, and discuss instructions and expectations.  I reminded KENIYAH GELINAS to be prepared with any vital sign and/or heart rhythm information that could potentially be obtained via home monitoring, at the time of her visit. I reminded DOMINIC RHOME to expect a phone call prior to her visit.  Dorothey Baseman 11/20/2019 4:33 PM   INSTRUCTIONS FOR DOWNLOADING THE MYCHART APP TO SMARTPHONE  - The patient must first make sure to have activated MyChart and know their login information - If Apple, go to App Store and type in MyChart in the search bar and download the app. If Android, ask patient to go to [employer] and type in [place] in the search bar and download the app. The app is free but as with any other app downloads, their phone may require them to verify saved payment information or Apple/Android  password.  - The patient will need to then log into the app with their MyChart username and password, and select [facility] as their healthcare provider to link the account. When it is time for your visit, go to the MyChart app, find appointments, and click Begin Video Visit. Be sure to Select Allow for your device to access the Microphone and Camera for your visit. You will then be connected, and your provider will be with you shortly.  **If they have any issues connecting, or need assistance please contact MyChart service desk [phone])**  **If using a computer, in order to ensure the best quality for their visit they will need to use either of the following Internet Browsers: [employer], or Google Chrome**  IF USING DOXIMITY or DOXY.ME - The patient will receive a link just prior to their visit by text.     FULL LENGTH CONSENT FOR TELE-HEALTH VISIT   I hereby voluntarily request, consent and authorize [facility] and its employed or contracted physicians, physician assistants, nurse practitioners or other licensed health care professionals (the Practitioner), to provide me with telemedicine health care services (the "Services") as deemed necessary by the treating Practitioner. I acknowledge and consent to receive the Services by the Practitioner via telemedicine. I understand that the telemedicine visit will involve communicating with the Practitioner through live audiovisual communication technology and the disclosure of certain medical information by electronic transmission. I acknowledge that I have been given the opportunity to request an in-person assessment or other available alternative prior to the telemedicine visit and am voluntarily participating in the telemedicine visit.  I understand that I have the right to withhold or withdraw my consent to the use of telemedicine in the course of my care at any time, without affecting my right to future care or treatment,  and that the Practitioner or I may terminate the telemedicine visit at any time. I understand that I have the right to inspect all information obtained and/or recorded in the course of the telemedicine visit and may receive copies of available information for a reasonable fee.  I understand that some of the potential risks of receiving the Services via telemedicine include:  Marland Kitchen Delay or interruption in medical evaluation due to technological equipment failure or disruption; . Information transmitted may not be sufficient (e.g. poor resolution of images) to allow for appropriate medical decision making by the Practitioner; and/or  . In rare instances, security protocols could fail, causing a breach of personal health information.  Furthermore, I acknowledge that it is my responsibility to provide information about my medical history, conditions and care that is complete and accurate to the best of my ability. I acknowledge that Practitioner's advice, recommendations, and/or decision may be based on factors not within their control, such as incomplete or inaccurate data provided by me or distortions of diagnostic images or specimens that may result from electronic transmissions. I understand that the practice  of medicine is not an Chief Strategy Officer and that Practitioner makes no warranties or guarantees regarding treatment outcomes. I acknowledge that I will receive a copy of this consent concurrently upon execution via email to the email address I last provided but may also request a printed copy by calling the office of [place].    I understand that my insurance will be billed for this visit.   I have read or had this consent read to me. . I understand the contents of this consent, which adequately explains the benefits and risks of the Services being provided via telemedicine.  . I have been provided ample opportunity to ask questions regarding this consent and the Services and have had my questions  answered to my satisfaction. . I give my informed consent for the services to be provided through the use of telemedicine in my medical care  By participating in this telemedicine visit I agree to the above.

## 2019-11-21 ENCOUNTER — Encounter: Payer: Self-pay | Admitting: Student

## 2019-11-21 ENCOUNTER — Telehealth (INDEPENDENT_AMBULATORY_CARE_PROVIDER_SITE_OTHER): Payer: PPO | Admitting: Student

## 2019-11-21 VITALS — BP 138/74 | HR 72 | Temp 97.6°F | Wt 152.0 lb

## 2019-11-21 DIAGNOSIS — I5031 Acute diastolic (congestive) heart failure: Secondary | ICD-10-CM | POA: Diagnosis not present

## 2019-11-21 DIAGNOSIS — I4821 Permanent atrial fibrillation: Secondary | ICD-10-CM

## 2019-11-21 DIAGNOSIS — I130 Hypertensive heart and chronic kidney disease with heart failure and stage 1 through stage 4 chronic kidney disease, or unspecified chronic kidney disease: Secondary | ICD-10-CM | POA: Diagnosis not present

## 2019-11-21 DIAGNOSIS — N183 Chronic kidney disease, stage 3 unspecified: Secondary | ICD-10-CM

## 2019-11-21 DIAGNOSIS — I10 Essential (primary) hypertension: Secondary | ICD-10-CM

## 2019-11-21 DIAGNOSIS — I351 Nonrheumatic aortic (valve) insufficiency: Secondary | ICD-10-CM

## 2019-11-21 DIAGNOSIS — I5032 Chronic diastolic (congestive) heart failure: Secondary | ICD-10-CM | POA: Diagnosis not present

## 2019-11-21 DIAGNOSIS — N1832 Chronic kidney disease, stage 3b: Secondary | ICD-10-CM | POA: Diagnosis not present

## 2019-11-21 DIAGNOSIS — I495 Sick sinus syndrome: Secondary | ICD-10-CM

## 2019-11-21 MED ORDER — DABIGATRAN ETEXILATE MESYLATE 75 MG PO CAPS: 75.0000 mg | ORAL_CAPSULE | Freq: Two times a day (BID) | ORAL | 3 refills | Status: AC

## 2019-11-21 NOTE — Patient Instructions (Signed)
Medication Instructions:  Increase PRADAXA TO 75 MG- TWO TIMES DAILY   Labwork: THIS WEEKS   BNP BMET  Testing/Procedures: NONE  Follow-Up: Your physician recommends that you schedule a follow-up appointment in: 3 WEEKS   Any Other Special Instructions Will Be Listed Below (If Applicable).     If you need a refill on your cardiac medications before your next appointment, please call your pharmacy.

## 2019-11-22 DIAGNOSIS — Z03818 Encounter for observation for suspected exposure to other biological agents ruled out: Secondary | ICD-10-CM | POA: Diagnosis not present

## 2019-11-24 DIAGNOSIS — E785 Hyperlipidemia, unspecified: Secondary | ICD-10-CM | POA: Diagnosis not present

## 2019-11-24 DIAGNOSIS — E039 Hypothyroidism, unspecified: Secondary | ICD-10-CM | POA: Diagnosis not present

## 2019-11-24 DIAGNOSIS — D519 Vitamin B12 deficiency anemia, unspecified: Secondary | ICD-10-CM | POA: Diagnosis not present

## 2019-11-24 DIAGNOSIS — E876 Hypokalemia: Secondary | ICD-10-CM | POA: Diagnosis not present

## 2019-11-24 DIAGNOSIS — I10 Essential (primary) hypertension: Secondary | ICD-10-CM | POA: Diagnosis not present

## 2019-11-24 DIAGNOSIS — I5042 Chronic combined systolic (congestive) and diastolic (congestive) heart failure: Secondary | ICD-10-CM | POA: Diagnosis not present

## 2019-11-26 DIAGNOSIS — F339 Major depressive disorder, recurrent, unspecified: Secondary | ICD-10-CM | POA: Diagnosis not present

## 2019-11-26 DIAGNOSIS — D519 Vitamin B12 deficiency anemia, unspecified: Secondary | ICD-10-CM | POA: Diagnosis not present

## 2019-11-27 DIAGNOSIS — G894 Chronic pain syndrome: Secondary | ICD-10-CM | POA: Diagnosis not present

## 2019-11-27 DIAGNOSIS — Z03818 Encounter for observation for suspected exposure to other biological agents ruled out: Secondary | ICD-10-CM | POA: Diagnosis not present

## 2019-11-28 ENCOUNTER — Encounter (HOSPITAL_COMMUNITY): Payer: Self-pay | Admitting: Emergency Medicine

## 2019-11-28 ENCOUNTER — Inpatient Hospital Stay (HOSPITAL_COMMUNITY)
Admission: EM | Admit: 2019-11-28 | Discharge: 2020-01-04 | DRG: 871 | Disposition: E | Payer: PPO | Source: Skilled Nursing Facility | Attending: Family Medicine | Admitting: Family Medicine

## 2019-11-28 ENCOUNTER — Other Ambulatory Visit: Payer: Self-pay

## 2019-11-28 ENCOUNTER — Telehealth: Payer: Self-pay

## 2019-11-28 ENCOUNTER — Emergency Department (HOSPITAL_COMMUNITY): Payer: PPO

## 2019-11-28 DIAGNOSIS — G609 Hereditary and idiopathic neuropathy, unspecified: Secondary | ICD-10-CM | POA: Diagnosis not present

## 2019-11-28 DIAGNOSIS — I4821 Permanent atrial fibrillation: Secondary | ICD-10-CM | POA: Diagnosis present

## 2019-11-28 DIAGNOSIS — I4891 Unspecified atrial fibrillation: Secondary | ICD-10-CM | POA: Diagnosis present

## 2019-11-28 DIAGNOSIS — Z950 Presence of cardiac pacemaker: Secondary | ICD-10-CM | POA: Diagnosis not present

## 2019-11-28 DIAGNOSIS — L0390 Cellulitis, unspecified: Secondary | ICD-10-CM | POA: Diagnosis not present

## 2019-11-28 DIAGNOSIS — Z66 Do not resuscitate: Secondary | ICD-10-CM | POA: Diagnosis not present

## 2019-11-28 DIAGNOSIS — R7303 Prediabetes: Secondary | ICD-10-CM | POA: Diagnosis present

## 2019-11-28 DIAGNOSIS — Z800 Family history of malignant neoplasm of digestive organs: Secondary | ICD-10-CM

## 2019-11-28 DIAGNOSIS — J440 Chronic obstructive pulmonary disease with acute lower respiratory infection: Secondary | ICD-10-CM | POA: Diagnosis not present

## 2019-11-28 DIAGNOSIS — L03119 Cellulitis of unspecified part of limb: Secondary | ICD-10-CM | POA: Diagnosis not present

## 2019-11-28 DIAGNOSIS — E43 Unspecified severe protein-calorie malnutrition: Secondary | ICD-10-CM | POA: Diagnosis not present

## 2019-11-28 DIAGNOSIS — L03115 Cellulitis of right lower limb: Secondary | ICD-10-CM | POA: Diagnosis not present

## 2019-11-28 DIAGNOSIS — L03116 Cellulitis of left lower limb: Secondary | ICD-10-CM | POA: Diagnosis present

## 2019-11-28 DIAGNOSIS — R221 Localized swelling, mass and lump, neck: Secondary | ICD-10-CM | POA: Diagnosis not present

## 2019-11-28 DIAGNOSIS — K1121 Acute sialoadenitis: Secondary | ICD-10-CM | POA: Diagnosis present

## 2019-11-28 DIAGNOSIS — Z515 Encounter for palliative care: Secondary | ICD-10-CM | POA: Diagnosis not present

## 2019-11-28 DIAGNOSIS — L02419 Cutaneous abscess of limb, unspecified: Secondary | ICD-10-CM | POA: Diagnosis present

## 2019-11-28 DIAGNOSIS — F329 Major depressive disorder, single episode, unspecified: Secondary | ICD-10-CM | POA: Diagnosis present

## 2019-11-28 DIAGNOSIS — E039 Hypothyroidism, unspecified: Secondary | ICD-10-CM | POA: Diagnosis present

## 2019-11-28 DIAGNOSIS — F319 Bipolar disorder, unspecified: Secondary | ICD-10-CM | POA: Diagnosis present

## 2019-11-28 DIAGNOSIS — E785 Hyperlipidemia, unspecified: Secondary | ICD-10-CM | POA: Diagnosis present

## 2019-11-28 DIAGNOSIS — E8779 Other fluid overload: Secondary | ICD-10-CM | POA: Diagnosis present

## 2019-11-28 DIAGNOSIS — N1832 Chronic kidney disease, stage 3b: Secondary | ICD-10-CM | POA: Diagnosis not present

## 2019-11-28 DIAGNOSIS — I340 Nonrheumatic mitral (valve) insufficiency: Secondary | ICD-10-CM | POA: Diagnosis not present

## 2019-11-28 DIAGNOSIS — I700 Atherosclerosis of aorta: Secondary | ICD-10-CM | POA: Diagnosis present

## 2019-11-28 DIAGNOSIS — J449 Chronic obstructive pulmonary disease, unspecified: Secondary | ICD-10-CM | POA: Diagnosis not present

## 2019-11-28 DIAGNOSIS — F419 Anxiety disorder, unspecified: Secondary | ICD-10-CM | POA: Diagnosis present

## 2019-11-28 DIAGNOSIS — I5032 Chronic diastolic (congestive) heart failure: Secondary | ICD-10-CM | POA: Diagnosis present

## 2019-11-28 DIAGNOSIS — Z87441 Personal history of nephrotic syndrome: Secondary | ICD-10-CM

## 2019-11-28 DIAGNOSIS — I5030 Unspecified diastolic (congestive) heart failure: Secondary | ICD-10-CM | POA: Diagnosis not present

## 2019-11-28 DIAGNOSIS — J189 Pneumonia, unspecified organism: Secondary | ICD-10-CM | POA: Diagnosis not present

## 2019-11-28 DIAGNOSIS — I10 Essential (primary) hypertension: Secondary | ICD-10-CM | POA: Diagnosis present

## 2019-11-28 DIAGNOSIS — A419 Sepsis, unspecified organism: Principal | ICD-10-CM | POA: Diagnosis present

## 2019-11-28 DIAGNOSIS — N179 Acute kidney failure, unspecified: Secondary | ICD-10-CM | POA: Diagnosis present

## 2019-11-28 DIAGNOSIS — I361 Nonrheumatic tricuspid (valve) insufficiency: Secondary | ICD-10-CM | POA: Diagnosis not present

## 2019-11-28 DIAGNOSIS — N170 Acute kidney failure with tubular necrosis: Secondary | ICD-10-CM | POA: Diagnosis not present

## 2019-11-28 DIAGNOSIS — Z7951 Long term (current) use of inhaled steroids: Secondary | ICD-10-CM

## 2019-11-28 DIAGNOSIS — Z79890 Hormone replacement therapy (postmenopausal): Secondary | ICD-10-CM

## 2019-11-28 DIAGNOSIS — Z20822 Contact with and (suspected) exposure to covid-19: Secondary | ICD-10-CM | POA: Diagnosis present

## 2019-11-28 DIAGNOSIS — E162 Hypoglycemia, unspecified: Secondary | ICD-10-CM | POA: Diagnosis not present

## 2019-11-28 DIAGNOSIS — M1990 Unspecified osteoarthritis, unspecified site: Secondary | ICD-10-CM | POA: Diagnosis present

## 2019-11-28 DIAGNOSIS — Z7902 Long term (current) use of antithrombotics/antiplatelets: Secondary | ICD-10-CM

## 2019-11-28 DIAGNOSIS — Z7189 Other specified counseling: Secondary | ICD-10-CM

## 2019-11-28 DIAGNOSIS — K219 Gastro-esophageal reflux disease without esophagitis: Secondary | ICD-10-CM | POA: Diagnosis present

## 2019-11-28 DIAGNOSIS — R0902 Hypoxemia: Secondary | ICD-10-CM | POA: Diagnosis not present

## 2019-11-28 DIAGNOSIS — N183 Chronic kidney disease, stage 3 unspecified: Secondary | ICD-10-CM | POA: Diagnosis not present

## 2019-11-28 DIAGNOSIS — I5033 Acute on chronic diastolic (congestive) heart failure: Secondary | ICD-10-CM | POA: Diagnosis present

## 2019-11-28 DIAGNOSIS — R069 Unspecified abnormalities of breathing: Secondary | ICD-10-CM | POA: Diagnosis not present

## 2019-11-28 DIAGNOSIS — I110 Hypertensive heart disease with heart failure: Secondary | ICD-10-CM | POA: Diagnosis not present

## 2019-11-28 DIAGNOSIS — M5430 Sciatica, unspecified side: Secondary | ICD-10-CM | POA: Diagnosis present

## 2019-11-28 DIAGNOSIS — I495 Sick sinus syndrome: Secondary | ICD-10-CM | POA: Diagnosis present

## 2019-11-28 DIAGNOSIS — I130 Hypertensive heart and chronic kidney disease with heart failure and stage 1 through stage 4 chronic kidney disease, or unspecified chronic kidney disease: Secondary | ICD-10-CM | POA: Diagnosis not present

## 2019-11-28 DIAGNOSIS — R0602 Shortness of breath: Secondary | ICD-10-CM | POA: Diagnosis not present

## 2019-11-28 DIAGNOSIS — I959 Hypotension, unspecified: Secondary | ICD-10-CM | POA: Diagnosis not present

## 2019-11-28 DIAGNOSIS — K1120 Sialoadenitis, unspecified: Secondary | ICD-10-CM

## 2019-11-28 DIAGNOSIS — R609 Edema, unspecified: Secondary | ICD-10-CM | POA: Diagnosis not present

## 2019-11-28 DIAGNOSIS — I5031 Acute diastolic (congestive) heart failure: Secondary | ICD-10-CM | POA: Diagnosis not present

## 2019-11-28 DIAGNOSIS — R0600 Dyspnea, unspecified: Secondary | ICD-10-CM

## 2019-11-28 DIAGNOSIS — G8929 Other chronic pain: Secondary | ICD-10-CM | POA: Diagnosis present

## 2019-11-28 DIAGNOSIS — Z79899 Other long term (current) drug therapy: Secondary | ICD-10-CM

## 2019-11-28 LAB — COMPREHENSIVE METABOLIC PANEL
ALT: 23 U/L (ref 0–44)
AST: 35 U/L (ref 15–41)
Albumin: 2 g/dL — ABNORMAL LOW (ref 3.5–5.0)
Alkaline Phosphatase: 133 U/L — ABNORMAL HIGH (ref 38–126)
Anion gap: 13 (ref 5–15)
BUN: 53 mg/dL — ABNORMAL HIGH (ref 8–23)
CO2: 24 mmol/L (ref 22–32)
Calcium: 7.8 mg/dL — ABNORMAL LOW (ref 8.9–10.3)
Chloride: 99 mmol/L (ref 98–111)
Creatinine, Ser: 2.68 mg/dL — ABNORMAL HIGH (ref 0.44–1.00)
GFR calc Af Amer: 18 mL/min — ABNORMAL LOW (ref >60)
GFR calc non Af Amer: 16 mL/min — ABNORMAL LOW (ref >60)
Glucose, Bld: 82 mg/dL (ref 70–99)
Potassium: 3.9 mmol/L (ref 3.5–5.1)
Sodium: 136 mmol/L (ref 135–145)
Total Bilirubin: 0.7 mg/dL (ref 0.3–1.2)
Total Protein: 5.4 g/dL — ABNORMAL LOW (ref 6.5–8.1)

## 2019-11-28 LAB — CBC
HCT: 43.3 % (ref 36.0–46.0)
Hemoglobin: 13.6 g/dL (ref 12.0–15.0)
MCH: 31.4 pg (ref 26.0–34.0)
MCHC: 31.4 g/dL (ref 30.0–36.0)
MCV: 100 fL (ref 80.0–100.0)
Platelets: 237 10*3/uL (ref 150–400)
RBC: 4.33 MIL/uL (ref 3.87–5.11)
RDW: 14.4 % (ref 11.5–15.5)
WBC: 15.9 10*3/uL — ABNORMAL HIGH (ref 4.0–10.5)
nRBC: 0.1 % (ref 0.0–0.2)

## 2019-11-28 LAB — BRAIN NATRIURETIC PEPTIDE: B Natriuretic Peptide: 908 pg/mL — ABNORMAL HIGH (ref 0.0–100.0)

## 2019-11-28 LAB — POC SARS CORONAVIRUS 2 AG -  ED: SARS Coronavirus 2 Ag: NEGATIVE

## 2019-11-28 MED ORDER — ONDANSETRON HCL 4 MG/2ML IJ SOLN
4.0000 mg | Freq: Once | INTRAMUSCULAR | Status: AC
  Administered 2019-11-29: 4 mg via INTRAVENOUS
  Filled 2019-11-28: qty 2

## 2019-11-28 MED ORDER — FENTANYL CITRATE (PF) 100 MCG/2ML IJ SOLN
50.0000 ug | Freq: Once | INTRAMUSCULAR | Status: AC
  Administered 2019-11-29: 50 ug via INTRAVENOUS
  Filled 2019-11-28: qty 2

## 2019-11-28 NOTE — ED Provider Notes (Signed)
[facility] EMERGENCY DEPARTMENT Provider Note   CSN: [id number] Arrival date & time: 11/25/2019  2138     History Chief Complaint  Patient presents with  . Leg Swelling    Kelsey Sandoval is a 86 y.o. female.  HPI     This is an 86 year old female with a history of COPD, hypertension, hyperlipidemia, chronic kidney disease who presents with neck pain and headache.  Patient reports that over the last 2 days she has had worsening left-sided facial, neck, and shoulder pain.  She describes a headache that she rates at 7 out of 10.  She is not taking anything for her symptoms.  She is requesting pain medication.  She reports sore throat and difficulty swallowing.  Has not noted any fevers.  Per triage note, also reported bilateral leg swelling.  However, patient states that this is at her baseline.  She also has chronic bilateral lower extremity wounds that are not new.  She is awake and oriented x3.  She is able to provide history.  Denies fevers or Covid symptoms.  Denies shortness of breath or chest pain, denies abdominal pain, nausea, vomiting.  Past Medical History:  Diagnosis Date  . Anxiety   . Aortic atherosclerosis ([place]) 10/10/2018  . Arthritis   . Back pain, chronic    Sciatica  . Cataracts, bilateral   . COPD (chronic obstructive pulmonary disease) ([place]) 10/10/2018  . Depression   . Dysrhythmia    AFib  . Essential hypertension   . GERD (gastroesophageal reflux disease)   . Hyperlipidemia   . Hypothyroidism   . Membranous nephropathy determined by biopsy 06/07/2015  . Nephrotic syndrome    RHUX  . Neuropathy   . Osteopenia   . Pacemaker    [employer]  . PAF (paroxysmal atrial fibrillation) ([place])   . Pre-diabetes   . Tachycardia-bradycardia syndrome [facility])     Patient Active Problem List   Diagnosis Date Noted  . Falls frequently 09/16/2019  . Aortic atherosclerosis ([place]) 10/10/2018  . COPD (chronic obstructive pulmonary disease) ([place]) 10/10/2018  . Hypertensive  urgency 07/31/2018  . Atrial fibrillation with RVR ([place]) 01/20/2017  . Nausea 01/20/2017  . Anxiety 01/20/2017  . Acute bronchitis 09/11/2016  . Chronic anticoagulation 09/07/2016  . Chronic kidney disease (CKD), stage III (moderate) 09/07/2016  . Hereditary and idiopathic peripheral neuropathy 03/05/2016  . Major depression 12/04/2015  . Major depression in remission ([place]) 09/02/2015  . Membranous nephropathy determined by biopsy 06/07/2015  . Osteoarthritis of both knees 03/26/2015  . Chronic pain syndrome 03/26/2015  . Intractable nausea and vomiting   . Nausea with vomiting   . Malnutrition of moderate degree ([place]) 03/08/2015  . UTI (lower urinary tract infection) 03/07/2015  . Hypothyroidism 02/14/2015  . Disorder of kidney 01/17/2015  . Nephrotic syndrome 06/21/2014  . Proteinuria 03/29/2014  . Osteopenia 03/29/2014  . Anemia, iron deficiency 08/22/2013  . Mitral insufficiency 06/11/2013  . Pacemaker 06/11/2013  . Paroxysmal atrial fibrillation ([place]) 09/26/2012  . Tachycardia-bradycardia syndrome ([place]) 09/26/2012  . Sick sinus syndrome ([place]) 09/26/2012  . Systemic hypertension 09/26/2012  . Dyslipidemia 09/26/2012    Past Surgical History:  Procedure Laterality Date  . ABDOMINAL HYSTERECTOMY     partial-pt has no ovaries  . ANTERIOR VITRECTOMY Right 03/11/2018   Procedure: ANTERIOR VITRECTOMY;  Surgeon: Baruch Goldmann, MD;  Location: [facility] ORS;  Service: Ophthalmology;  Laterality: Right;  . APPENDECTOMY    . BACK SURGERY    . BIOPSY N/A 01/17/2014   Procedure:  BIOPSY;  Surgeon: Rogene Houston, MD;  Location: [facility] ENDO SUITE;  Service: Endoscopy;  Laterality: N/A;  . CATARACT EXTRACTION W/PHACO Left 02/28/2015   Procedure: CATARACT EXTRACTION PHACO AND INTRAOCULAR LENS PLACEMENT (IOC);  Surgeon: [name], MD;  Location: [facility] ORS;  Service: Ophthalmology;  Laterality: Left;  CDE 18.71  . CATARACT EXTRACTION W/PHACO Right 03/11/2018   Procedure: CATARACT EXTRACTION PHACO  AND  INTRAOCULAR LENS PLACEMENT RIGHT EYE ;  Surgeon: Baruch Goldmann, MD;  Location: [facility] ORS;  Service: Ophthalmology;  Laterality: Right;  CDE: 19.39  . COLONOSCOPY  9/05  . COLONOSCOPY N/A 10/11/2013   Procedure: COLONOSCOPY;  Surgeon: Rogene Houston, MD;  Location: [facility] ENDO SUITE;  Service: Endoscopy;  Laterality: N/A;  1200  . ESOPHAGOGASTRODUODENOSCOPY N/A 01/17/2014   Procedure: ESOPHAGOGASTRODUODENOSCOPY (EGD);  Surgeon: Rogene Houston, MD;  Location: [facility] END[facility] SU[facility];  Service: Endoscopy;  Laterality: N/A;  230  . ESOPHAGOGASTRODUODENOSCOPY N/A 01/02/2016   Procedure: ESOPHAGOGASTRODUODENOSCOPY (EGD);  Surgeon: Rogene Houston, MD;  Location: [facility] END[facility] SU[facility];  Service: Endoscopy;  Laterality: N/A;  240  . INSERT / REPLACE / REMOVE PACEMAKER     2012  . Wisdom tooth extracton       OB History    Gravida  2   Para  2   Term  2   Preterm      AB      Living        SAB      TAB      Ectopic      Multiple      Live Births              Family History  Problem Relation Age of Onset  . Colon cancer Brother   . Anxiety disorder Sister   . Depression Sister   . Anxiety disorder Sister   . Depression Sister     Social History   Tobacco Use  . Smoking status: Never Smoker  . Smokeless tobacco: Never Used  Substance Use Topics  . Alcohol use: No    Alcohol/week: 0.0 standard drinks  . Drug use: No    Home Medications Prior to Admission medications   Medication Sig Start Date End Date Taking? Authorizing Provider  ALPRAZolam Duanne Moron) 0.25 MG tablet Take 1 tablet (0.25 mg total) by mouth 2 (two) times daily as needed for anxiety or sleep. 09/20/19   Roxan Hockey, MD  budesonide-formoterol (SYMBICORT) 160-4.5 MCG/ACT inhaler Inhale 2 puffs into the lungs 2 (two) times daily. 09/04/19   Kathyrn Drown, MD  busPIRone (BUSPAR) 5 MG tablet Take 1 tablet (5 mg total) by mouth 3 (three) times daily. Patient not taking: Reported on 11/21/2019 09/20/19   Roxan Hockey, MD    dabigatran (PRADAXA) 75 MG CAPS capsule Take 1 capsule (75 mg total) by mouth 2 (two) times daily. 11/21/19   Strader, Fransisco Hertz, PA-C  DULoxetine (CYMBALTA) 60 MG capsule Take 1 capsule (60 mg total) by mouth daily. 06/02/19   Cloria Spring, MD  gabapentin (NEURONTIN) 300 MG capsule TAKE 1 CAPSULE BY MOUTH THREE TIMES DAILY Patient taking differently: Take 300 mg by mouth 4 (four) times daily.  03/31/19   Kathyrn Drown, MD  hydrALAZINE (APRESOLINE) 50 MG tablet Take 1 tablet (50 mg total) by mouth 3 (three) times daily. Patient not taking: Reported on 11/21/2019 09/20/19   Roxan Hockey, MD  HYDROcodone-acetaminophen (NORCO/VICODIN) 5-325 MG tablet Take 1 tablet by mouth every 6 (six) hours as needed  for moderate pain or severe pain. 09/20/19 09/19/20  Roxan Hockey, MD  hydrOXYzine (ATARAX/VISTARIL) 25 MG tablet Take 1 tablet (25 mg total) by mouth every 8 (eight) hours as needed for anxiety or itching. prn itching. Caution drowiness Patient not taking: Reported on 11/21/2019 09/20/19   Roxan Hockey, MD  levothyroxine (SYNTHROID) 50 MCG tablet TAKE 1 TABLET BY MOUTH ONCE DAILY BEFORE BREAKFAST Patient taking differently: 75 mcg. TAKE 1 TABLET BY MOUTH ONCE DAILY BEFORE BREAKFAST 07/25/19   Sallee Lange A, MD  lisinopril (ZESTRIL) 20 MG tablet Take 1 tablet (20 mg total) by mouth daily. 09/21/19   Roxan Hockey, MD  metoprolol tartrate (LOPRESSOR) 100 MG tablet Take 100 mg by mouth 2 (two) times daily.    [provider]  ondansetron (ZOFRAN) 8 MG tablet Take 1 tablet (8 mg total) by mouth every 8 (eight) hours as needed for nausea. 09/04/19   Kathyrn Drown, MD  pantoprazole (PROTONIX) 40 MG tablet Take 1 tablet (40 mg total) by mouth daily. 08/11/19   Kathyrn Drown, MD  PARoxetine (PAXIL) 10 MG tablet Take 10 mg by mouth daily.    [provider]  pravastatin (PRAVACHOL) 80 MG tablet Take 1 tablet (80 mg total) by mouth every evening. 05/01/19   Luking, Elayne Snare,  MD  PROAIR HFA 108 (90 Base) MCG/ACT inhaler INHALE 2 PUFFS INTO LUNGS EVERY 6 HOURS AS NEEDED FOR WHEEZING OR SHORTNESS OF BREATH 11/21/18   Kathyrn Drown, MD  torsemide (DEMADEX) 20 MG tablet Take 2 tablets (40 mg total) by mouth daily. 09/21/19   Roxan Hockey, MD  triamcinolone cream (KENALOG) 0.1 % Apply 1 application topically 2 (two) times daily as needed. Patient not taking: Reported on 11/21/2019 01/30/19   Kathyrn Drown, MD  vitamin B-12 (CYANOCOBALAMIN) 1000 MCG tablet Take 1 tablet (1,000 mcg total) by mouth daily. Patient taking differently: Take 1,000 mcg by mouth every morning.  03/11/15   Kathie Dike, MD    Allergies    Penicillins, Levaquin [levofloxacin], Sulfa antibiotics, and Zithromax [azithromycin]  Review of Systems   Review of Systems  Constitutional: Negative for fever.  HENT: Positive for ear pain and sore throat.   Respiratory: Negative for shortness of breath.   Cardiovascular: Positive for leg swelling. Negative for chest pain.  Gastrointestinal: Negative for abdominal pain, nausea and vomiting.  Genitourinary: Negative for dysuria.  Musculoskeletal: Positive for neck pain. Negative for back pain.  Neurological: Positive for headaches. Negative for speech difficulty, weakness and numbness.  All other systems reviewed and are negative.   Physical Exam Updated Vital Signs BP (!) 173/91   Pulse 71   Temp (!) 97.5 F (36.4 C) (Oral)   Resp 18   Ht 1.753 m (5\' 9" )   Wt 74.8 kg   SpO2 99%   BMI 24.37 kg/m   Physical Exam Vitals and nursing note reviewed.  Constitutional:      Appearance: She is well-developed.     Comments: Elderly, chronically ill-appearing, nontoxic  HENT:     Head: Atraumatic.     Comments: Tenderness palpation with swelling noted over the left parotid region extending into the neck    Right Ear: Tympanic membrane normal.     Left Ear: Tympanic membrane normal.     Nose: Nose normal.     Mouth/Throat:     Mouth: Mucous  membranes are moist.     Comments: Posterior oropharynx clear, no significant erythema or swelling noted Eyes:  Pupils: Pupils are equal, round, and reactive to light.  Neck:     Comments: Tenderness and swelling in the submental region Cardiovascular:     Rate and Rhythm: Normal rate and regular rhythm.     Heart sounds: Normal heart sounds.  Pulmonary:     Effort: Pulmonary effort is normal. No respiratory distress.     Breath sounds: Wheezing present.     Comments: Crackles Abdominal:     General: Bowel sounds are normal.     Palpations: Abdomen is soft.     Tenderness: There is no abdominal tenderness.  Musculoskeletal:     Cervical back: Normal range of motion and neck supple.     Right lower leg: Edema present.     Left lower leg: Edema present.     Comments: 1+ bilateral lower extremity edema  Skin:    General: Skin is warm and dry.     Comments: Bilateral lower extremity erythema with wounds  Neurological:     Mental Status: She is alert and oriented to person, place, and time.  Psychiatric:        Mood and Affect: Mood normal.     ED Results / Procedures / Treatments   Labs (all labs ordered are listed, but only abnormal results are displayed) Labs Reviewed  CBC - Abnormal; Notable for the following components:      Result Value   WBC 15.9 (*)    All other components within normal limits  COMPREHENSIVE METABOLIC PANEL - Abnormal; Notable for the following components:   BUN 53 (*)    Creatinine, Ser 2.68 (*)    Calcium 7.8 (*)    Total Protein 5.4 (*)    Albumin 2.0 (*)    Alkaline Phosphatase 133 (*)    GFR calc non Af Amer 16 (*)    GFR calc Af Amer 18 (*)    All other components within normal limits  BRAIN NATRIURETIC PEPTIDE - Abnormal; Notable for the following components:   B Natriuretic Peptide 908.0 (*)    All other components within normal limits  GROUP A STREP BY PCR  CULTURE, BLOOD (ROUTINE X 2)  CULTURE, BLOOD (ROUTINE X 2)  POC SARS  CORONAVIRUS 2 AG -  ED    EKG EKG Interpretation  Date/Time:  Tuesday November 28 2019 21:51:13 EST Ventricular Rate:  89 PR Interval:    QRS Duration: 80 QT Interval:  341 QTC Calculation: 386 R Axis:   80 Text Interpretation: Atrial fibrillation Paired ventricular premature complexes Anteroseptal infarct, age indeterminate Baseline wander in lead(s) III new anterior q waves and ectopy from prior 12/20 Confirmed by Aletta Edouard [phone]) on 11/13/2019 10:01:51 PM   Radiology CT Soft Tissue Neck Wo Contrast  Result Date: 11/29/2019 CLINICAL DATA:  Initial evaluation for non pulsatile neck mass. EXAM: CT NECK WITHOUT CONTRAST TECHNIQUE: Multidetector CT imaging of the neck was performed following the standard protocol without intravenous contrast. COMPARISON:  None. FINDINGS: Pharynx and larynx: Examination technically limited by motion artifact and lack of IV contrast. Oral cavity within normal limits. Patient is largely edentulous. Asymmetric prominence of the left palatine tonsil as compared to the right, likely reactive. Associated inflammatory stranding and swelling within the adjacent left parapharyngeal space related to the inflammatory process within the left face/neck. Asymmetric fullness and edema extends inferiorly along the left pharynx as well. Nasopharynx within normal limits. Mild retropharyngeal edema/effusion, greater on the left. No definite discrete retropharyngeal collection. Epiglottis itself grossly within normal limits. 7  mm hyperdensity seen at the level of the left arytenoid cartilage (series 2, image 70), indeterminate, and could be related to prior intervention. No discernible mass identified. True cords symmetric and within normal limits. Subglottic airway clear. Salivary glands: Asymmetric enlargement with inflammatory stranding seen involving the left parotid gland, suggesting acute parotitis. No obstructive Rozario seen within Stensen's duct. Associated inflammatory  stranding seen throughout the adjacent left parotid space, extending inferiorly along the left neck, and medially into the left parapharyngeal space. Inflammatory changes surround the left submandibular gland as well related to the inflammatory process within the left neck. No appreciable abscess or drainable fluid collection seen on this noncontrast examination. Right-sided salivary glands within normal limits. Thyroid: Thyroid grossly within normal limits. Lymph nodes: Mild asymmetric prominence of subcentimeter left-sided cervical lymph nodes, likely reactive. No pathologically enlarged lymph nodes identified. Vascular: Moderate atherosclerotic change seen about the aortic arch and carotid bifurcations. Calcified atherosclerosis present at the skull base as well. Limited intracranial: Age-related cerebral and cerebellar atrophy. Otherwise unremarkable. Visualized orbits: Visualized globes and orbital soft tissues within normal limits. Mastoids and visualized paranasal sinuses: Visualized paranasal sinuses are clear. Mastoid air cells and middle ear cavities are well pneumatized and free of fluid. Skeleton: No definite osseous abnormality seen on this motion degraded exam. No discrete lytic or blastic osseous lesions. Upper chest: Large layering bilateral pleural effusions partially visualized. Right-sided pacemaker/AICD partially visualized as well. Other: None. IMPRESSION: 1. Findings consistent with acute left parotitis. Associated swelling with inflammatory stranding throughout the adjacent left neck compatible with associated regional cellulitis. No obstructive Eckhart or drainable fluid collection identified. 2. Mucosal edema and swelling involving the left pharynx related to the inflammatory process within the adjacent left neck. 3. Large layering bilateral pleural effusions, partially visualized. 4. 7 mm hyperdensity and/or sclerosis at the level of the left arytenoid cartilage, indeterminate, and could be  related to prior intervention. Correlation with history recommended. If no previous intervention has been performed, nonemergent outpatient ENT referral for direct visualization suggested for further evaluation. No discrete mass identified on this noncontrast motion degraded exam. 5.  Aortic Atherosclerosis (ICD10-I70.0). Electronically Signed   By: Jeannine Boga M.D.   On: 11/29/2019 00:50   DG Chest Portable 1 View  Result Date: 12/02/2019 CLINICAL DATA:  Shortness of breath EXAM: PORTABLE CHEST 1 VIEW COMPARISON:  September 27, 2019 FINDINGS: The heart size and mediastinal contours are unchanged with mild cardiomegaly. Aortic knob calcifications. A right-sided pacemaker is again identified. There are small bilateral pleural effusions. Hazy airspace opacity seen at the right lung base which could be layering effusion or atelectasis. There is streaky atelectasis seen in the right mid lung. No acute osseous abnormality. IMPRESSION: Small bilateral pleural effusions. Hazy airspace opacity at the right lung base which could be due to layering effusion or atelectasis. Electronically Signed   By: Prudencio Pair M.D.   On: 12/02/2019 22:25    Procedures Procedures (including critical care time)  CRITICAL CARE Performed by: Merryl Hacker   Total critical care time: 31 minutes  Critical care time was exclusive of separately billable procedures and treating other patients.  Critical care was necessary to treat or prevent imminent or life-threatening deterioration.  Critical care was time spent personally by me on the following activities: development of treatment plan with patient and/or surrogate as well as nursing, discussions with consultants, evaluation of patient's response to treatment, examination of patient, obtaining history from patient or surrogate, ordering and performing treatments and interventions, ordering and  review of laboratory studies, ordering and review of radiographic  studies, pulse oximetry and re-evaluation of patient's condition.   Medications Ordered in ED Medications  cefTRIAXone (ROCEPHIN) 1 g in sodium chloride 0.9 % 100 mL IVPB (has no administration in time range)  metroNIDAZOLE (FLAGYL) IVPB 500 mg (has no administration in time range)  fentaNYL (SUBLIMAZE) injection 50 mcg (50 mcg Intravenous Given 11/29/19 0012)  ondansetron (ZOFRAN) injection 4 mg (4 mg Intravenous Given 11/29/19 0012)    ED Course  I have reviewed the triage vital signs and the nursing notes.  Pertinent labs & imaging results that were available during my care of the patient were reviewed by me and considered in my medical decision making (see chart for details).    MDM Rules/Calculators/A&P                       Patient presents with several complaints.  She primarily is complaining of left neck swelling and headache.  Additionally there are some concern for shortness of breath and lower extremity edema.  She is uncomfortable appearing but nontoxic.  Vital signs notable for blood pressure of 173/91.  She is afebrile.  She does have significant tenderness swelling of the left parotid region extending inferiorly into the neck.  This is likely the source of her headache.  Additionally, she does have some lower extremity swelling and erythema with chronic wounds.  Question whether this is subacute and what her baseline is.  She is on 2 L of oxygen.  She does not normally wear oxygen.  She is not uncomfortable appearing.  She does have some crackles in the bases.  Basic lab work obtained including BNP.  EKG without evidence of ischemia.  She is in rate controlled atrial fibrillation.  Chest x-ray concerning for bilateral pleural effusions.  Coronavirus and strep testing negative.  She does have a white count to 16.  She also has acute kidney injury with creatinine of 2.68.  Baseline is around 1.7.  BNP is stably elevated.  Suspect some element of heart failure.  She has a history of  diastolic heart failure.  Last echocardiogram was 1 year ago.  Given her AKI, will discuss with the hospitalist administration of Lasix.  Suspect she will need a larger dose to effectively diurese.  CT scan of the neck was obtained given parotid swelling.  It does show suspicion for parotitis with likely cellulitis and adjacent inflammation.  Given her white count, will treat for bacterial parotitis with Rocephin and Flagyl.  Blood cultures were added.  Will admit for ongoing antibiotic therapy and diuresis in the setting of AKI.  Final Clinical Impression(s) / ED Diagnoses Final diagnoses:  Parotitis  Acute diastolic heart failure (HCC)  AKI (acute kidney injury) ([place])  Cellulitis of lower extremity, unspecified laterality    Rx / DC Orders ED Discharge Orders    None       Hai Grabe, Barbette Hair, MD 11/29/19 [phone]

## 2019-11-28 NOTE — Telephone Encounter (Signed)
Returned pt call to [place] @ [phone]

## 2019-11-28 NOTE — ED Triage Notes (Signed)
Pt has bilateral leg swelling and neck swelling. Per facility pt is more altered than normal. Pt c/o left ear pain.

## 2019-11-28 NOTE — Telephone Encounter (Signed)
Please call per Daughter, [name] [phone] Questions in reference to Mother  Kelsey Sandoval

## 2019-11-29 ENCOUNTER — Telehealth: Payer: Self-pay | Admitting: Student

## 2019-11-29 ENCOUNTER — Inpatient Hospital Stay (HOSPITAL_COMMUNITY): Payer: PPO

## 2019-11-29 DIAGNOSIS — E039 Hypothyroidism, unspecified: Secondary | ICD-10-CM | POA: Diagnosis present

## 2019-11-29 DIAGNOSIS — I4821 Permanent atrial fibrillation: Secondary | ICD-10-CM | POA: Diagnosis present

## 2019-11-29 DIAGNOSIS — N179 Acute kidney failure, unspecified: Secondary | ICD-10-CM | POA: Diagnosis present

## 2019-11-29 DIAGNOSIS — R0902 Hypoxemia: Secondary | ICD-10-CM | POA: Diagnosis not present

## 2019-11-29 DIAGNOSIS — R7303 Prediabetes: Secondary | ICD-10-CM | POA: Diagnosis present

## 2019-11-29 DIAGNOSIS — L03115 Cellulitis of right lower limb: Secondary | ICD-10-CM | POA: Diagnosis present

## 2019-11-29 DIAGNOSIS — E8779 Other fluid overload: Secondary | ICD-10-CM | POA: Diagnosis present

## 2019-11-29 DIAGNOSIS — I5031 Acute diastolic (congestive) heart failure: Secondary | ICD-10-CM | POA: Diagnosis present

## 2019-11-29 DIAGNOSIS — L03116 Cellulitis of left lower limb: Secondary | ICD-10-CM | POA: Diagnosis present

## 2019-11-29 DIAGNOSIS — L0390 Cellulitis, unspecified: Secondary | ICD-10-CM | POA: Diagnosis present

## 2019-11-29 DIAGNOSIS — E785 Hyperlipidemia, unspecified: Secondary | ICD-10-CM | POA: Diagnosis present

## 2019-11-29 DIAGNOSIS — I340 Nonrheumatic mitral (valve) insufficiency: Secondary | ICD-10-CM | POA: Diagnosis not present

## 2019-11-29 DIAGNOSIS — Z515 Encounter for palliative care: Secondary | ICD-10-CM | POA: Diagnosis not present

## 2019-11-29 DIAGNOSIS — N1832 Chronic kidney disease, stage 3b: Secondary | ICD-10-CM | POA: Diagnosis present

## 2019-11-29 DIAGNOSIS — A419 Sepsis, unspecified organism: Secondary | ICD-10-CM | POA: Diagnosis present

## 2019-11-29 DIAGNOSIS — Z20822 Contact with and (suspected) exposure to covid-19: Secondary | ICD-10-CM | POA: Diagnosis present

## 2019-11-29 DIAGNOSIS — K1121 Acute sialoadenitis: Secondary | ICD-10-CM | POA: Diagnosis present

## 2019-11-29 DIAGNOSIS — I5033 Acute on chronic diastolic (congestive) heart failure: Secondary | ICD-10-CM | POA: Diagnosis present

## 2019-11-29 DIAGNOSIS — L02419 Cutaneous abscess of limb, unspecified: Secondary | ICD-10-CM | POA: Diagnosis present

## 2019-11-29 DIAGNOSIS — Z66 Do not resuscitate: Secondary | ICD-10-CM | POA: Diagnosis not present

## 2019-11-29 DIAGNOSIS — G609 Hereditary and idiopathic neuropathy, unspecified: Secondary | ICD-10-CM | POA: Diagnosis present

## 2019-11-29 DIAGNOSIS — I130 Hypertensive heart and chronic kidney disease with heart failure and stage 1 through stage 4 chronic kidney disease, or unspecified chronic kidney disease: Secondary | ICD-10-CM | POA: Diagnosis present

## 2019-11-29 DIAGNOSIS — I361 Nonrheumatic tricuspid (valve) insufficiency: Secondary | ICD-10-CM | POA: Diagnosis not present

## 2019-11-29 DIAGNOSIS — Z950 Presence of cardiac pacemaker: Secondary | ICD-10-CM | POA: Diagnosis not present

## 2019-11-29 DIAGNOSIS — E43 Unspecified severe protein-calorie malnutrition: Secondary | ICD-10-CM | POA: Diagnosis present

## 2019-11-29 DIAGNOSIS — F319 Bipolar disorder, unspecified: Secondary | ICD-10-CM | POA: Diagnosis present

## 2019-11-29 DIAGNOSIS — E162 Hypoglycemia, unspecified: Secondary | ICD-10-CM | POA: Diagnosis not present

## 2019-11-29 DIAGNOSIS — J440 Chronic obstructive pulmonary disease with acute lower respiratory infection: Secondary | ICD-10-CM | POA: Diagnosis present

## 2019-11-29 DIAGNOSIS — J189 Pneumonia, unspecified organism: Secondary | ICD-10-CM | POA: Diagnosis present

## 2019-11-29 LAB — CBC WITH DIFFERENTIAL/PLATELET
Abs Immature Granulocytes: 0.26 10*3/uL — ABNORMAL HIGH (ref 0.00–0.07)
Basophils Absolute: 0 10*3/uL (ref 0.0–0.1)
Basophils Relative: 0 %
Eosinophils Absolute: 0 10*3/uL (ref 0.0–0.5)
Eosinophils Relative: 0 %
HCT: 39.2 % (ref 36.0–46.0)
Hemoglobin: 12.4 g/dL (ref 12.0–15.0)
Immature Granulocytes: 1 %
Lymphocytes Relative: 4 %
Lymphs Abs: 1 10*3/uL (ref 0.7–4.0)
MCH: 31.6 pg (ref 26.0–34.0)
MCHC: 31.6 g/dL (ref 30.0–36.0)
MCV: 100 fL (ref 80.0–100.0)
Monocytes Absolute: 0.9 10*3/uL (ref 0.1–1.0)
Monocytes Relative: 4 %
Neutro Abs: 22.2 10*3/uL — ABNORMAL HIGH (ref 1.7–7.7)
Neutrophils Relative %: 91 %
Platelets: 226 10*3/uL (ref 150–400)
RBC: 3.92 MIL/uL (ref 3.87–5.11)
RDW: 14.4 % (ref 11.5–15.5)
WBC: 24.4 10*3/uL — ABNORMAL HIGH (ref 4.0–10.5)
nRBC: 0 % (ref 0.0–0.2)

## 2019-11-29 LAB — GROUP A STREP BY PCR: Group A Strep by PCR: NOT DETECTED

## 2019-11-29 LAB — HEMOGLOBIN A1C
Hgb A1c MFr Bld: 5 % (ref 4.8–5.6)
Mean Plasma Glucose: 96.8 mg/dL

## 2019-11-29 LAB — MRSA PCR SCREENING: MRSA by PCR: POSITIVE — AB

## 2019-11-29 LAB — ECHOCARDIOGRAM COMPLETE
Height: 69 in
Weight: 2640 oz

## 2019-11-29 LAB — BASIC METABOLIC PANEL
Anion gap: 13 (ref 5–15)
BUN: 56 mg/dL — ABNORMAL HIGH (ref 8–23)
CO2: 25 mmol/L (ref 22–32)
Calcium: 7.7 mg/dL — ABNORMAL LOW (ref 8.9–10.3)
Chloride: 98 mmol/L (ref 98–111)
Creatinine, Ser: 2.83 mg/dL — ABNORMAL HIGH (ref 0.44–1.00)
GFR calc Af Amer: 17 mL/min — ABNORMAL LOW (ref >60)
GFR calc non Af Amer: 15 mL/min — ABNORMAL LOW (ref >60)
Glucose, Bld: 94 mg/dL (ref 70–99)
Potassium: 4.2 mmol/L (ref 3.5–5.1)
Sodium: 136 mmol/L (ref 135–145)

## 2019-11-29 LAB — GLUCOSE, CAPILLARY
Glucose-Capillary: 33 mg/dL — CL (ref 70–99)
Glucose-Capillary: 40 mg/dL — CL (ref 70–99)
Glucose-Capillary: 125 mg/dL — ABNORMAL HIGH (ref 70–99)
Glucose-Capillary: 84 mg/dL (ref 70–99)
Glucose-Capillary: 65 mg/dL — ABNORMAL LOW (ref 70–99)
Glucose-Capillary: 67 mg/dL — ABNORMAL LOW (ref 70–99)

## 2019-11-29 MED ORDER — ONDANSETRON HCL 4 MG PO TABS: 4.0000 mg | ORAL_TABLET | Freq: Four times a day (QID) | ORAL | Status: DC | PRN

## 2019-11-29 MED ORDER — SODIUM CHLORIDE 0.9 % IV SOLN: 2.0000 g | INTRAVENOUS | Status: DC

## 2019-11-29 MED ORDER — ALBUTEROL SULFATE HFA 108 (90 BASE) MCG/ACT IN AERS: 2.0000 | INHALATION_SPRAY | RESPIRATORY_TRACT | Status: DC | PRN

## 2019-11-29 MED ORDER — METRONIDAZOLE IN NACL 5-0.79 MG/ML-% IV SOLN
500.0000 mg | Freq: Three times a day (TID) | INTRAVENOUS | Status: DC
  Administered 2019-11-29 – 2019-12-01 (×6): 500 mg via INTRAVENOUS
  Filled 2019-11-29 (×6): qty 100

## 2019-11-29 MED ORDER — ACETAMINOPHEN 650 MG RE SUPP: 650.0000 mg | Freq: Four times a day (QID) | RECTAL | Status: DC | PRN

## 2019-11-29 MED ORDER — ALPRAZOLAM 0.25 MG PO TABS
0.2500 mg | ORAL_TABLET | Freq: Two times a day (BID) | ORAL | Status: DC | PRN
  Administered 2019-11-29 – 2019-11-30 (×2): 0.25 mg via ORAL
  Filled 2019-11-29 (×2): qty 1

## 2019-11-29 MED ORDER — METRONIDAZOLE IN NACL 5-0.79 MG/ML-% IV SOLN
500.0000 mg | Freq: Once | INTRAVENOUS | Status: AC
  Administered 2019-11-29: 500 mg via INTRAVENOUS
  Filled 2019-11-29: qty 100

## 2019-11-29 MED ORDER — DEXTROSE 50 % IV SOLN
25.0000 mL | INTRAVENOUS | Status: DC | PRN
  Administered 2019-11-29 – 2019-12-01 (×3): 50 mL via INTRAVENOUS
  Filled 2019-11-29 (×3): qty 50

## 2019-11-29 MED ORDER — SODIUM CHLORIDE 0.9 % IV SOLN
1.0000 g | INTRAVENOUS | Status: DC
  Administered 2019-11-30: 04:00:00 1 g via INTRAVENOUS
  Filled 2019-11-29: qty 10

## 2019-11-29 MED ORDER — DEXTROSE 50 % IV SOLN
1.0000 | Freq: Once | INTRAVENOUS | Status: AC
  Administered 2019-11-29: 50 mL via INTRAVENOUS
  Filled 2019-11-29: qty 50

## 2019-11-29 MED ORDER — ONDANSETRON HCL 4 MG/2ML IJ SOLN
4.0000 mg | Freq: Four times a day (QID) | INTRAMUSCULAR | Status: DC | PRN
  Administered 2019-12-02: 4 mg via INTRAVENOUS
  Filled 2019-11-29: qty 2

## 2019-11-29 MED ORDER — METOPROLOL TARTRATE 50 MG PO TABS
100.0000 mg | ORAL_TABLET | Freq: Two times a day (BID) | ORAL | Status: DC
  Administered 2019-11-29: 100 mg via ORAL
  Filled 2019-11-29: qty 2

## 2019-11-29 MED ORDER — HYDRALAZINE HCL 25 MG PO TABS
50.0000 mg | ORAL_TABLET | Freq: Three times a day (TID) | ORAL | Status: DC
  Administered 2019-11-29 (×3): 50 mg via ORAL
  Filled 2019-11-29 (×3): qty 2

## 2019-11-29 MED ORDER — MOMETASONE FURO-FORMOTEROL FUM 200-5 MCG/ACT IN AERO
2.0000 | INHALATION_SPRAY | Freq: Two times a day (BID) | RESPIRATORY_TRACT | Status: DC
  Administered 2019-11-29 – 2019-12-02 (×8): 2 via RESPIRATORY_TRACT
  Filled 2019-11-29 (×2): qty 8.8

## 2019-11-29 MED ORDER — HYDROXYZINE HCL 10 MG PO TABS: 10.0000 mg | ORAL_TABLET | Freq: Three times a day (TID) | ORAL | Status: DC | PRN

## 2019-11-29 MED ORDER — SODIUM CHLORIDE 0.9 % IV SOLN: 250.0000 mL | INTRAVENOUS | Status: DC | PRN

## 2019-11-29 MED ORDER — ACETAMINOPHEN 325 MG PO TABS
650.0000 mg | ORAL_TABLET | Freq: Four times a day (QID) | ORAL | Status: DC | PRN
  Administered 2019-11-30: 09:00:00 650 mg via ORAL
  Filled 2019-11-29: qty 2

## 2019-11-29 MED ORDER — VANCOMYCIN HCL 1500 MG/300ML IV SOLN
1500.0000 mg | Freq: Once | INTRAVENOUS | Status: AC
  Administered 2019-11-29: 1500 mg via INTRAVENOUS
  Filled 2019-11-29: qty 300

## 2019-11-29 MED ORDER — HYDROCODONE-ACETAMINOPHEN 5-325 MG PO TABS
1.0000 | ORAL_TABLET | Freq: Four times a day (QID) | ORAL | Status: DC | PRN
  Administered 2019-11-29 – 2019-11-30 (×3): 1 via ORAL
  Filled 2019-11-29 (×3): qty 1

## 2019-11-29 MED ORDER — GABAPENTIN 100 MG PO CAPS
200.0000 mg | ORAL_CAPSULE | Freq: Two times a day (BID) | ORAL | Status: DC
  Administered 2019-11-29 – 2019-12-02 (×8): 200 mg via ORAL
  Filled 2019-11-29 (×8): qty 2

## 2019-11-29 MED ORDER — LEVOTHYROXINE SODIUM 75 MCG PO TABS
75.0000 ug | ORAL_TABLET | Freq: Every day | ORAL | Status: DC
  Administered 2019-11-29 – 2019-12-01 (×3): 75 ug via ORAL
  Filled 2019-11-29 (×3): qty 1

## 2019-11-29 MED ORDER — SODIUM CHLORIDE 0.9% FLUSH: 3.0000 mL | INTRAVENOUS | Status: DC | PRN

## 2019-11-29 MED ORDER — FUROSEMIDE 10 MG/ML IJ SOLN
40.0000 mg | Freq: Two times a day (BID) | INTRAMUSCULAR | Status: DC
  Administered 2019-11-29 (×2): 40 mg via INTRAVENOUS
  Filled 2019-11-29 (×2): qty 4

## 2019-11-29 MED ORDER — FENTANYL CITRATE (PF) 100 MCG/2ML IJ SOLN
50.0000 ug | Freq: Once | INTRAMUSCULAR | Status: AC
  Administered 2019-11-29: 50 ug via INTRAVENOUS
  Filled 2019-11-29: qty 2

## 2019-11-29 MED ORDER — DEXTROSE 50 % IV SOLN
INTRAVENOUS | Status: AC
  Administered 2019-11-29: 50 mL via INTRAVENOUS
  Filled 2019-11-29: qty 50

## 2019-11-29 MED ORDER — DULOXETINE HCL 60 MG PO CPEP
60.0000 mg | ORAL_CAPSULE | Freq: Every day | ORAL | Status: DC
  Administered 2019-11-29 – 2019-11-30 (×2): 60 mg via ORAL
  Filled 2019-11-29 (×2): qty 1

## 2019-11-29 MED ORDER — LACTATED RINGERS IV BOLUS
500.0000 mL | Freq: Once | INTRAVENOUS | Status: AC
  Administered 2019-11-29: 500 mL via INTRAVENOUS

## 2019-11-29 MED ORDER — PANTOPRAZOLE SODIUM 40 MG PO TBEC
40.0000 mg | DELAYED_RELEASE_TABLET | Freq: Every day | ORAL | Status: DC
  Administered 2019-11-29 – 2019-12-01 (×3): 40 mg via ORAL
  Filled 2019-11-29 (×3): qty 1

## 2019-11-29 MED ORDER — PRAVASTATIN SODIUM 40 MG PO TABS
80.0000 mg | ORAL_TABLET | Freq: Every evening | ORAL | Status: DC
  Administered 2019-11-29 – 2019-11-30 (×2): 80 mg via ORAL
  Filled 2019-11-29 (×3): qty 2

## 2019-11-29 MED ORDER — SODIUM CHLORIDE 0.9% FLUSH
3.0000 mL | Freq: Two times a day (BID) | INTRAVENOUS | Status: DC
  Administered 2019-11-29 – 2019-12-02 (×7): 3 mL via INTRAVENOUS

## 2019-11-29 MED ORDER — SODIUM CHLORIDE 0.9 % IV SOLN
1.0000 g | Freq: Once | INTRAVENOUS | Status: AC
  Administered 2019-11-29: 1 g via INTRAVENOUS
  Filled 2019-11-29: qty 10

## 2019-11-29 MED ORDER — GABAPENTIN 300 MG PO CAPS
300.0000 mg | ORAL_CAPSULE | Freq: Four times a day (QID) | ORAL | Status: DC
  Filled 2019-11-29: qty 1

## 2019-11-29 MED ORDER — DABIGATRAN ETEXILATE MESYLATE 75 MG PO CAPS
75.0000 mg | ORAL_CAPSULE | Freq: Two times a day (BID) | ORAL | Status: DC
  Administered 2019-11-29 – 2019-12-01 (×5): 75 mg via ORAL
  Filled 2019-11-29 (×11): qty 1

## 2019-11-29 MED ORDER — METRONIDAZOLE IN NACL 5-0.79 MG/ML-% IV SOLN
500.0000 mg | Freq: Three times a day (TID) | INTRAVENOUS | Status: DC
  Administered 2019-11-29: 500 mg via INTRAVENOUS
  Filled 2019-11-29: qty 100

## 2019-11-29 MED ORDER — VANCOMYCIN HCL IN DEXTROSE 1-5 GM/200ML-% IV SOLN: 1000.0000 mg | INTRAVENOUS | Status: DC

## 2019-11-29 NOTE — Progress Notes (Signed)
Consult request has been received. CSW attempting to follow up at present time  Erine Phenix M. Amauria Younts LCSWA Transitions of Care  Clinical Social Worker  Ph: [phone] 

## 2019-11-29 NOTE — Progress Notes (Signed)
ANTICOAGULATION CONSULT NOTE - Initial Consult  Pharmacy Consult for pradaxa Indication: atrial fibrillation  Allergies  Allergen Reactions  . Penicillins Other (See Comments)    Caused patient to pass out.Can take cephalosporins Has patient had a PCN reaction causing immediate rash, facial/tongue/throat swelling, SOB or lightheadedness with hypotension: no Has patient had a PCN reaction causing severe rash involving mucus membranes or skin necrosis: No Has patient had a PCN reaction that required hospitalization No Has patient had a PCN reaction occurring within the last 10 years: No If all of the above answers are "NO", then may proceed with Cephalosporin use.   . Levaquin [Levofloxacin] Nausea Only  . Sulfa Antibiotics Other (See Comments)    Unknown  . Zithromax [Azithromycin] Nausea Only and Rash    Patient Measurements: Height: 5\' 9"  (175.3 cm) Weight: 165 lb (74.8 kg) IBW/kg (Calculated) : 66.2  Vital Signs: Temp: 97.5 F (36.4 C) (02/23 2143) Temp Source: Oral (02/23 2143) BP: 141/92 (02/24 0230) Pulse Rate: 88 (02/24 0230)  Labs: Recent Labs    11/19/2019 2225  HGB 13.6  HCT 43.3  PLT 237  CREATININE 2.68*    Estimated Creatinine Clearance: 16.3 mL/min (A) (by C-G formula based on SCr of 2.68 mg/dL (H)).   Medical History: Past Medical History:  Diagnosis Date  . Anxiety   . Aortic atherosclerosis ([place]) 10/10/2018  . Arthritis   . Back pain, chronic    Sciatica  . Cataracts, bilateral   . COPD (chronic obstructive pulmonary disease) ([place]) 10/10/2018  . Depression   . Dysrhythmia    AFib  . Essential hypertension   . GERD (gastroesophageal reflux disease)   . Hyperlipidemia   . Hypothyroidism   . Membranous nephropathy determined by biopsy 06/07/2015  . Nephrotic syndrome    RHUX  . Neuropathy   . Osteopenia   . Pacemaker    Medtronic  . PAF (paroxysmal atrial fibrillation) ([place])   . Pre-diabetes   . Tachycardia-bradycardia syndrome ([place])      Medications:  Medications Prior to Admission  Medication Sig Dispense Refill Last Dose  . ALPRAZolam (XANAX) 0.25 MG tablet Take 1 tablet (0.25 mg total) by mouth 2 (two) times daily as needed for anxiety or sleep. 10 tablet 0   . budesonide-formoterol (SYMBICORT) 160-4.5 MCG/ACT inhaler Inhale 2 puffs into the lungs 2 (two) times daily. 1 Inhaler 3   . busPIRone (BUSPAR) 5 MG tablet Take 1 tablet (5 mg total) by mouth 3 (three) times daily. (Patient not taking: Reported on 11/21/2019) 90 tablet 2   . dabigatran (PRADAXA) 75 MG CAPS capsule Take 1 capsule (75 mg total) by mouth 2 (two) times daily. 180 capsule 3   . DULoxetine (CYMBALTA) 60 MG capsule Take 1 capsule (60 mg total) by mouth daily. 180 capsule 2   . gabapentin (NEURONTIN) 300 MG capsule TAKE 1 CAPSULE BY MOUTH THREE TIMES DAILY (Patient taking differently: Take 300 mg by mouth 4 (four) times daily. ) 90 capsule 5   . hydrALAZINE (APRESOLINE) 50 MG tablet Take 1 tablet (50 mg total) by mouth 3 (three) times daily. (Patient not taking: Reported on 11/21/2019) 90 tablet 5   . HYDROcodone-acetaminophen (NORCO/VICODIN) 5-325 MG tablet Take 1 tablet by mouth every 6 (six) hours as needed for moderate pain or severe pain. 15 tablet 0   . hydrOXYzine (ATARAX/VISTARIL) 25 MG tablet Take 1 tablet (25 mg total) by mouth every 8 (eight) hours as needed for anxiety or itching. prn itching. Caution drowiness (Patient  not taking: Reported on 11/21/2019) 30 tablet 2   . levothyroxine (SYNTHROID) 50 MCG tablet TAKE 1 TABLET BY MOUTH ONCE DAILY BEFORE BREAKFAST (Patient taking differently: 75 mcg. TAKE 1 TABLET BY MOUTH ONCE DAILY BEFORE BREAKFAST) 90 tablet 1   . lisinopril (ZESTRIL) 20 MG tablet Take 1 tablet (20 mg total) by mouth daily. 30 tablet 3   . metoprolol tartrate (LOPRESSOR) 100 MG tablet Take 100 mg by mouth 2 (two) times daily.     . ondansetron (ZOFRAN) 8 MG tablet Take 1 tablet (8 mg total) by mouth every 8 (eight) hours as needed for  nausea. 12 tablet 0   . pantoprazole (PROTONIX) 40 MG tablet Take 1 tablet (40 mg total) by mouth daily. 30 tablet 5   . PARoxetine (PAXIL) 10 MG tablet Take 10 mg by mouth daily.     . pravastatin (PRAVACHOL) 80 MG tablet Take 1 tablet (80 mg total) by mouth every evening. 90 tablet 0   . PROAIR HFA 108 (90 Base) MCG/ACT inhaler INHALE 2 PUFFS INTO LUNGS EVERY 6 HOURS AS NEEDED FOR WHEEZING OR SHORTNESS OF BREATH 9 g 6   . torsemide (DEMADEX) 20 MG tablet Take 2 tablets (40 mg total) by mouth daily. 30 tablet 2   . triamcinolone cream (KENALOG) 0.1 % Apply 1 application topically 2 (two) times daily as needed. (Patient not taking: Reported on 11/21/2019) 60 g 2   . vitamin B-12 (CYANOCOBALAMIN) 1000 MCG tablet Take 1 tablet (1,000 mcg total) by mouth daily. (Patient taking differently: Take 1,000 mcg by mouth every morning. )       Assessment: 86 yo lady to continue home pradaxa for afib.  Hg 13.6, PTLC 237 Goal of Therapy:  therapeutic anticoagulation Monitor platelets by anticoagulation protocol: Yes   Plan:  Pradaxa 75mg  po bid Monitor for bleeding complications  Kelsey Sandoval 11/29/2019,3:31 AM

## 2019-11-29 NOTE — Progress Notes (Signed)
CRITICAL VALUE ALERT  Critical Value:  33  Date & Time Notied: 02/24 0730  Provider Notified: yes  Orders Received/Actions taken: Orange juice given and order given to recheck in 20 minutes.

## 2019-11-29 NOTE — Progress Notes (Signed)
MD notified of re-check value. He is now in the room rounding.

## 2019-11-29 NOTE — Progress Notes (Signed)
*  PRELIMINARY RESULTS* Echocardiogram 2D Echocardiogram has been performed.  [name] 11/29/2019, 9:50 AM

## 2019-11-29 NOTE — Progress Notes (Signed)
Pt is still sleeping in room as daughter did not want to wake her up for dinner. Tray remains in the room. Pt shows no signs of distress, and daughter believes that she is resting comfortably.

## 2019-11-29 NOTE — Progress Notes (Signed)
Pt's most recent blood glucose reading is 125, posting at 1107.

## 2019-11-29 NOTE — Consult Note (Signed)
WOC Nurse Consult Note: Patient receiving care in [facility].  Consult completed remotely after review of record, including photos of wounds. Reason for Consult: BLE cellulitis Wound type: Per photos, there are multiple pretibial partial thickness wounds surrounded by discolored skin of the BLE Pressure Injury POA: Yes/No/NA Measurement: Wound bed: see images Drainage (amount, consistency, odor) serosanginous Periwound: Dressing procedure/placement/frequency: Cleanse bilateral lower legs with soap and water. Pat dry. Place as many Xeoroform gauze Kellie Simmering [phone]) as necessary to cover all open areas or fluid filled areas on BLE. Secure with Kerlex. Change daily and prn. Monitor the wound area(s) for worsening of condition such as: Signs/symptoms of infection,  Increase in size,  Development of or worsening of odor, Development of pain, or increased pain at the affected locations.  Notify the medical team if any of these develop.  Thank you for the consult. [place] nurse will not follow at this time.  Please re-consult the [place] team if needed.  Val Riles, RN, MSN, CWOCN, CNS-BC, pager [phone]

## 2019-11-29 NOTE — H&P (Signed)
History and Physical    Kelsey Sandoval NTZ:[id number] DOB: January 05, 1933 DOA: 11/13/2019  PCP: Kathyrn Drown, MD   Patient coming from: Home.  I have personally briefly reviewed patient's old medical records in [place]  Chief Complaint: Leg swelling.  HPI: Kelsey Sandoval is a 86 y.o. female with medical history significant for anxiety, depression, aortic atherosclerosis, arthritis, chronic back pain, bilateral cataracts, COPD,  essential hypertension, GERD, hyperlipidemia, hypothyroidism, CKD stage 3b, nephrotic syndrome/membranous nephropathy, peripheral neuropathy, paroxysmal atrial fibrillation, sick sinus syndrome, history of pacemaker placement who is brought from her facility due to bilateral lower extremities and neck swelling.  ED Course: Initial vital signs temperature 97.5 F, pulse 90, respirations 24, blood pressure 159/106 mmHg O2 sat 100% on 2 LPM via [place]. The patient was given fentanyl 50 mcg IVP Zofran 4 mg IVP x1.  CBC showed a white count of 15.9, hemoglobin 13.6 g/dL and platelets 237.  Group A strep by PCR was nondetected.  SARS coronavirus 2 antigen was negative.  PCR for coronavirus is still pending.  Cultures x2 were drawn.  CMP shows normal electrolytes when calcium is corrected to albumin.  BUN is 53, creatinine 2.68 mg/dL.  In December 2020 her creatinine level was 1.47, 2 measurements in November was 1.54 and in July was 1.34 mg/dL.  Total protein was 5.4 and albumin 2.0 g/L.  Transaminases and bilirubin are normal.  Alk phos slightly elevated 133 units/L.  BNP was 908 pg/mL.  Review of Systems: As per HPI otherwise 10 point review of systems negative.   Past Medical History:  Diagnosis Date  . Anxiety   . Aortic atherosclerosis ([place]) 10/10/2018  . Arthritis   . Back pain, chronic    Sciatica  . Cataracts, bilateral   . COPD (chronic obstructive pulmonary disease) ([place]) 10/10/2018  . Depression   . Dysrhythmia    AFib  . Essential hypertension   . GERD  (gastroesophageal reflux disease)   . Hyperlipidemia   . Hypothyroidism   . Membranous nephropathy determined by biopsy 06/07/2015  . Nephrotic syndrome    RHUX  . Neuropathy   . Osteopenia   . Pacemaker    [employer]  . PAF (paroxysmal atrial fibrillation) ([place])   . Pre-diabetes   . Tachycardia-bradycardia syndrome [facility])     Past Surgical History:  Procedure Laterality Date  . ABDOMINAL HYSTERECTOMY     partial-pt has no ovaries  . ANTERIOR VITRECTOMY Right 03/11/2018   Procedure: ANTERIOR VITRECTOMY;  Surgeon: Baruch Goldmann, MD;  Location: [facility];  Service: Ophthalmology;  Laterality: Right;  . APPENDECTOMY    . BACK SURGERY    . BIOPSY N/A 01/17/2014   Procedure: BIOPSY;  Surgeon: Rogene Houston, MD;  Location: [facility];  Service: Endoscopy;  Laterality: N/A;  . CATARACT EXTRACTION W/PHACO Left 02/28/2015   Procedure: CATARACT EXTRACTION PHACO AND INTRAOCULAR LENS PLACEMENT (IOC);  Surgeon: [name], MD;  Location: [facility];  Service: Ophthalmology;  Laterality: Left;  CDE 18.71  . CATARACT EXTRACTION W/PHACO Right 03/11/2018   Procedure: CATARACT EXTRACTION PHACO  AND INTRAOCULAR LENS PLACEMENT RIGHT EYE ;  Surgeon: Baruch Goldmann, MD;  Location: [facility];  Service: Ophthalmology;  Laterality: Right;  CDE: 19.39  . COLONOSCOPY  9/05  . COLONOSCOPY N/A 10/11/2013   Procedure: COLONOSCOPY;  Surgeon: Rogene Houston, MD;  Location: [facility];  Service: Endoscopy;  Laterality: N/A;  1200  . ESOPHAGOGASTRODUODENOSCOPY N/A 01/17/2014   Procedure: ESOPHAGOGASTRODUODENOSCOPY (EGD);  Surgeon: Bernadene Person  Gloriann Loan, MD;  Location: [facility];  Service: Endoscopy;  Laterality: N/A;  230  . ESOPHAGOGASTRODUODENOSCOPY N/A 01/02/2016   Procedure: ESOPHAGOGASTRODUODENOSCOPY (EGD);  Surgeon: Rogene Houston, MD;  Location: [facility];  Service: Endoscopy;  Laterality: N/A;  240  . INSERT / REPLACE / REMOVE PACEMAKER     2012  . Wisdom tooth extracton       reports that she has never smoked.  She has never used smokeless tobacco. She reports that she does not drink alcohol or use drugs.  Allergies  Allergen Reactions  . Penicillins Other (See Comments)    Caused patient to pass out.Can take cephalosporins Has patient had a PCN reaction causing immediate rash, facial/tongue/throat swelling, SOB or lightheadedness with hypotension: no Has patient had a PCN reaction causing severe rash involving mucus membranes or skin necrosis: No Has patient had a PCN reaction that required hospitalization No Has patient had a PCN reaction occurring within the last 10 years: No If all of the above answers are "NO", then may proceed with Cephalosporin use.   . Levaquin [Levofloxacin] Nausea Only  . Sulfa Antibiotics Other (See Comments)    Unknown  . Zithromax [Azithromycin] Nausea Only and Rash    Family History  Problem Relation Age of Onset  . Colon cancer Brother   . Anxiety disorder Sister   . Depression Sister   . Anxiety disorder Sister   . Depression Sister    Prior to Admission medications   Medication Sig Start Date End Date Taking? Authorizing Provider  ALPRAZolam Duanne Moron) 0.25 MG tablet Take 1 tablet (0.25 mg total) by mouth 2 (two) times daily as needed for anxiety or sleep. 09/20/19   Roxan Hockey, MD  budesonide-formoterol (SYMBICORT) 160-4.5 MCG/ACT inhaler Inhale 2 puffs into the lungs 2 (two) times daily. 09/04/19   Kathyrn Drown, MD  busPIRone (BUSPAR) 5 MG tablet Take 1 tablet (5 mg total) by mouth 3 (three) times daily. Patient not taking: Reported on 11/21/2019 09/20/19   Roxan Hockey, MD  dabigatran (PRADAXA) 75 MG CAPS capsule Take 1 capsule (75 mg total) by mouth 2 (two) times daily. 11/21/19   Strader, Fransisco Hertz, PA-C  DULoxetine (CYMBALTA) 60 MG capsule Take 1 capsule (60 mg total) by mouth daily. 06/02/19   Cloria Spring, MD  gabapentin (NEURONTIN) 300 MG capsule TAKE 1 CAPSULE BY MOUTH THREE TIMES DAILY Patient taking differently: Take 300 mg by mouth  4 (four) times daily.  03/31/19   Kathyrn Drown, MD  hydrALAZINE (APRESOLINE) 50 MG tablet Take 1 tablet (50 mg total) by mouth 3 (three) times daily. Patient not taking: Reported on 11/21/2019 09/20/19   Roxan Hockey, MD  HYDROcodone-acetaminophen (NORCO/VICODIN) 5-325 MG tablet Take 1 tablet by mouth every 6 (six) hours as needed for moderate pain or severe pain. 09/20/19 09/19/20  Roxan Hockey, MD  hydrOXYzine (ATARAX/VISTARIL) 25 MG tablet Take 1 tablet (25 mg total) by mouth every 8 (eight) hours as needed for anxiety or itching. prn itching. Caution drowiness Patient not taking: Reported on 11/21/2019 09/20/19   Roxan Hockey, MD  levothyroxine (SYNTHROID) 50 MCG tablet TAKE 1 TABLET BY MOUTH ONCE DAILY BEFORE BREAKFAST Patient taking differently: 75 mcg. TAKE 1 TABLET BY MOUTH ONCE DAILY BEFORE BREAKFAST 07/25/19   Sallee Lange A, MD  lisinopril (ZESTRIL) 20 MG tablet Take 1 tablet (20 mg total) by mouth daily. 09/21/19   Roxan Hockey, MD  metoprolol tartrate (LOPRESSOR) 100 MG tablet Take 100 mg by mouth  2 (two) times daily.    [provider]  ondansetron (ZOFRAN) 8 MG tablet Take 1 tablet (8 mg total) by mouth every 8 (eight) hours as needed for nausea. 09/04/19   Kathyrn Drown, MD  pantoprazole (PROTONIX) 40 MG tablet Take 1 tablet (40 mg total) by mouth daily. 08/11/19   Kathyrn Drown, MD  PARoxetine (PAXIL) 10 MG tablet Take 10 mg by mouth daily.    [provider]  pravastatin (PRAVACHOL) 80 MG tablet Take 1 tablet (80 mg total) by mouth every evening. 05/01/19   Luking, Elayne Snare, MD  PROAIR HFA 108 (90 Base) MCG/ACT inhaler INHALE 2 PUFFS INTO LUNGS EVERY 6 HOURS AS NEEDED FOR WHEEZING OR SHORTNESS OF BREATH 11/21/18   Kathyrn Drown, MD  torsemide (DEMADEX) 20 MG tablet Take 2 tablets (40 mg total) by mouth daily. 09/21/19   Roxan Hockey, MD  triamcinolone cream (KENALOG) 0.1 % Apply 1 application topically 2 (two) times daily as needed. Patient  not taking: Reported on 11/21/2019 01/30/19   Kathyrn Drown, MD  vitamin B-12 (CYANOCOBALAMIN) 1000 MCG tablet Take 1 tablet (1,000 mcg total) by mouth daily. Patient taking differently: Take 1,000 mcg by mouth every morning.  03/11/15   Kathie Dike, MD    Physical Exam: Vitals:   11/09/2019 2141 11/12/2019 2143 11/29/19 0030  BP:  (!) 159/106 (!) 173/91  Pulse:  90 71  Resp:  (!) 24 18  Temp:  (!) 97.5 F (36.4 C)   TempSrc:  Oral   SpO2:  100% 99%  Weight: 74.8 kg    Height: 5' 9"  (1.753 m)      Constitutional: Looks chronically ill, but currently in NAD. Eyes: PERRL, lids and conjunctivae mildly injected.  No icterus. ENMT: Mucous membranes are dry.  Left facial tenderness extending to the left auricular area and upper cervical area with mild erythema and tenderness to palpation (See picture below).  Posterior pharynx clear of any exudate or lesions. Neck: normal, supple, no masses, no thyromegaly Respiratory: Decreased breath sounds with bilateral wheezing and bibasilar crackles. Normal respiratory effort. No accessory muscle use.  Cardiovascular: Irregularly irregular, no murmurs / rubs / gallops. No extremity edema. 2+ pedal pulses. No carotid bruits.  Abdomen: no tenderness, no masses palpated. No hepatosplenomegaly. Bowel sounds positive.  Musculoskeletal: no clubbing / cyanosis. Good ROM, no contractures. Normal muscle tone.  Skin: Tender bilateral pretibial wounds with surrounding erythema, edema with serous liquid filled bullae and some open ones with bleeding.  See pictures below. Neurologic: CN 2-12 grossly intact. Sensation intact, DTR normal.  Generalized nonfocal weakness. Psychiatric: Alert and oriented x 3 with some difficulty.  Mildly anxious mood.         Labs on Admission: I have personally reviewed following labs and imaging studies  CBC: Recent Labs  Lab 11/30/2019 2225  WBC 15.9*  HGB 13.6  HCT 43.3  MCV 100.0  PLT 470   Basic Metabolic  Panel: Recent Labs  Lab 11/19/2019 2225  NA 136  K 3.9  CL 99  CO2 24  GLUCOSE 82  BUN 53*  CREATININE 2.68*  CALCIUM 7.8*   GFR: Estimated Creatinine Clearance: 16.3 mL/min (A) (by C-G formula based on SCr of 2.68 mg/dL (H)). Liver Function Tests: Recent Labs  Lab 11/23/2019 2225  AST 35  ALT 23  ALKPHOS 133*  BILITOT 0.7  PROT 5.4*  ALBUMIN 2.0*   No results for input(s): LIPASE, AMYLASE in the last 168 hours. No results  for input(s): AMMONIA in the last 168 hours. Coagulation Profile: No results for input(s): INR, PROTIME in the last 168 hours. Cardiac Enzymes: No results for input(s): CKTOTAL, CKMB, CKMBINDEX, TROPONINI in the last 168 hours. BNP (last 3 results) No results for input(s): PROBNP in the last 8760 hours. HbA1C: No results for input(s): HGBA1C in the last 72 hours. CBG: No results for input(s): GLUCAP in the last 168 hours. Lipid Profile: No results for input(s): CHOL, HDL, LDLCALC, TRIG, CHOLHDL, LDLDIRECT in the last 72 hours. Thyroid Function Tests: No results for input(s): TSH, T4TOTAL, FREET4, T3FREE, THYROIDAB in the last 72 hours. Anemia Panel: No results for input(s): VITAMINB12, FOLATE, FERRITIN, TIBC, IRON, RETICCTPCT in the last 72 hours. Urine analysis:    Component Value Date/Time   COLORURINE YELLOW 09/17/2019 [place] 09/17/2019 0934   LABSPEC 1.018 09/17/2019 0934   PHURINE 6.0 09/17/2019 0934   GLUCOSEU 50 (A) 09/17/2019 0934   HGBUR NEGATIVE 09/17/2019 0934   BILIRUBINUR NEGATIVE 09/17/2019 0934   BILIRUBINUR 3+ 01/14/2016 1036   KETONESUR NEGATIVE 09/17/2019 0934   PROTEINUR >=300 (A) 09/17/2019 0934   UROBILINOGEN negative 01/14/2016 1036   UROBILINOGEN 0.2 03/07/2015 2005   NITRITE NEGATIVE 09/17/2019 0934   LEUKOCYTESUR NEGATIVE 09/17/2019 0934    Radiological Exams on Admission: CT Soft Tissue Neck Wo Contrast  Result Date: 11/29/2019 CLINICAL DATA:  Initial evaluation for non pulsatile neck mass.  EXAM: CT NECK WITHOUT CONTRAST TECHNIQUE: Multidetector CT imaging of the neck was performed following the standard protocol without intravenous contrast. COMPARISON:  None. FINDINGS: Pharynx and larynx: Examination technically limited by motion artifact and lack of IV contrast. Oral cavity within normal limits. Patient is largely edentulous. Asymmetric prominence of the left palatine tonsil as compared to the right, likely reactive. Associated inflammatory stranding and swelling within the adjacent left parapharyngeal space related to the inflammatory process within the left face/neck. Asymmetric fullness and edema extends inferiorly along the left pharynx as well. Nasopharynx within normal limits. Mild retropharyngeal edema/effusion, greater on the left. No definite discrete retropharyngeal collection. Epiglottis itself grossly within normal limits. 7 mm hyperdensity seen at the level of the left arytenoid cartilage (series 2, image 70), indeterminate, and could be related to prior intervention. No discernible mass identified. True cords symmetric and within normal limits. Subglottic airway clear. Salivary glands: Asymmetric enlargement with inflammatory stranding seen involving the left parotid gland, suggesting acute parotitis. No obstructive Gosse seen within Stensen's duct. Associated inflammatory stranding seen throughout the adjacent left parotid space, extending inferiorly along the left neck, and medially into the left parapharyngeal space. Inflammatory changes surround the left submandibular gland as well related to the inflammatory process within the left neck. No appreciable abscess or drainable fluid collection seen on this noncontrast examination. Right-sided salivary glands within normal limits. Thyroid: Thyroid grossly within normal limits. Lymph nodes: Mild asymmetric prominence of subcentimeter left-sided cervical lymph nodes, likely reactive. No pathologically enlarged lymph nodes identified.  Vascular: Moderate atherosclerotic change seen about the aortic arch and carotid bifurcations. Calcified atherosclerosis present at the skull base as well. Limited intracranial: Age-related cerebral and cerebellar atrophy. Otherwise unremarkable. Visualized orbits: Visualized globes and orbital soft tissues within normal limits. Mastoids and visualized paranasal sinuses: Visualized paranasal sinuses are clear. Mastoid air cells and middle ear cavities are well pneumatized and free of fluid. Skeleton: No definite osseous abnormality seen on this motion degraded exam. No discrete lytic or blastic osseous lesions. Upper chest: Large layering bilateral pleural effusions partially visualized. Right-sided  pacemaker/AICD partially visualized as well. Other: None. IMPRESSION: 1. Findings consistent with acute left parotitis. Associated swelling with inflammatory stranding throughout the adjacent left neck compatible with associated regional cellulitis. No obstructive Barrell or drainable fluid collection identified. 2. Mucosal edema and swelling involving the left pharynx related to the inflammatory process within the adjacent left neck. 3. Large layering bilateral pleural effusions, partially visualized. 4. 7 mm hyperdensity and/or sclerosis at the level of the left arytenoid cartilage, indeterminate, and could be related to prior intervention. Correlation with history recommended. If no previous intervention has been performed, nonemergent outpatient ENT referral for direct visualization suggested for further evaluation. No discrete mass identified on this noncontrast motion degraded exam. 5.  Aortic Atherosclerosis (ICD10-I70.0). Electronically Signed   By: Jeannine Boga M.D.   On: 11/29/2019 00:50   DG Chest Portable 1 View  Result Date: 11/22/2019 CLINICAL DATA:  Shortness of breath EXAM: PORTABLE CHEST 1 VIEW COMPARISON:  September 27, 2019 FINDINGS: The heart size and mediastinal contours are unchanged with  mild cardiomegaly. Aortic knob calcifications. A right-sided pacemaker is again identified. There are small bilateral pleural effusions. Hazy airspace opacity seen at the right lung base which could be layering effusion or atelectasis. There is streaky atelectasis seen in the right mid lung. No acute osseous abnormality. IMPRESSION: Small bilateral pleural effusions. Hazy airspace opacity at the right lung base which could be due to layering effusion or atelectasis. Electronically Signed   By: Prudencio Pair M.D.   On: 11/27/2019 22:25   10/31/2018 echocardiogram -------------------------------------------------------------------  LV EF: 60% -  65%   -------------------------------------------------------------------  History:  PMH: Tachycardia-bradycardia syndrome Mitral valve  Insufficiency Atrial fibrillation. Chronic obstructive pulmonary  disease. Risk factors: Dyslipidemia.   -------------------------------------------------------------------  Study Conclusions   - Left ventricle: The cavity size was normal. Wall thickness was  increased increased in a pattern of mild to moderate LVH.  Systolic function was normal. The estimated ejection fraction was  in the range of 60% to 65%. Wall motion was normal; there were no  regional wall motion abnormalities. The study is not technically  sufficient to allow evaluation of LV diastolic function.  - Aortic valve: There was mild regurgitation. Valve area (VTI):  2.41 cm^2. Valve area (Vmax): 2.13 cm^2.  - Left atrium: The atrium was severely dilated.  - Right ventricle: The cavity size was mildly dilated.  - Right atrium: The atrium was moderately dilated.   EKG: Independently reviewed.  Vent. rate 89 BPM PR interval * ms QRS duration 80 ms QT/QTc 341/386 ms P-R-T axes * 80 249 Atrial fibrillation Paired ventricular premature complexes Anteroseptal infarct, age indeterminate Baseline wander in lead(s) III new anterior  q waves and ectopy from prior 12/20  Assessment/Plan Principal Problem:   Cellulitis of multiple sites (lower extremities) Admit to telemetry/inpatient. Continue local care. Consult wound care. Continue ceftriaxone 1 g Q 24 hr IVPB. Analgesics as needed.  Active Problems:   Acute parotitis Continue ceftriaxone 1 g IVPB every 24. Continue metronidazole for anaerobic coverage. Analgesics as needed.    Acute renal failure superimposed on stage 3b chronic kidney disease (HCC) Hold torsemide. Hold lisinopril. Monitor intake and output. Monitor renal function and electrolytes. Nephrology consult if no improvement.    Cardiac volume overload Fluid restriction. Monitor daily weights, intake and output. Get echocardiogram.    Paroxysmal atrial fibrillation (HCC) CHA?DS?-VASc Score of at least 6. Pradaxa 75 mg p.o. twice daily. Continue metoprolol 100 mg p.o. twice daily.    Systemic hypertension Hold  lisinopril 20 mg p.o. daily. Continue metoprolol 100 mg p.o. twice daily. Continue hydralazine 50 mg p.o. 3 times daily.    Dyslipidemia Continue pravastatin 80 mg p.o. daily.    Hypothyroidism Continue levothyroxine 75 mcg p.o. daily.    Major depression Continue duloxetine 60 mg p.o. daily.    Hereditary and idiopathic peripheral neuropathy Continue duloxetine. Continue gabapentin 300 mg p.o. 4 times daily.    COPD (chronic obstructive pulmonary disease) ([place]) Supplemental oxygen as needed. Bronchodilators as needed.    Pre-diabetes Carbohydrate modified diet. CBG monitoring AC and at bedtime.    DVT prophylaxis: On Pradaxa. Code Status: Full code. Family Communication: Disposition Plan: Admit for IV antibiotic therapy, AKI and volume overload treatment. Consults called:  Admission status: Telemetry/inpatient.   Reubin Milan MD [facility]  If 7PM-7AM, please contact night-coverage [email or web address]  11/29/2019, 2:32 AM   This document was  prepared using Dragon voice recognition software and may contain some unintended transcription errors.

## 2019-11-29 NOTE — Telephone Encounter (Signed)
I will [place], PA-C

## 2019-11-29 NOTE — Telephone Encounter (Signed)
    Thank you for the update. We can see her results from the hospital and they are currently following her for an AKI.   Signed, Erma Heritage, PA-C 11/29/2019, 2:11 PM Pager: [phone]

## 2019-11-29 NOTE — Telephone Encounter (Signed)
Sharyn Lull @ [facility] called to let [place] know the pt is moving out of [place] today and that she is currently admitted at [facility]-- she has NOT had her labs done that [place] ordered from her [facility] last week.  Please call Sharyn Lull @ [phone]

## 2019-11-29 NOTE — Progress Notes (Signed)
Medications given slightly earlier because MD is planning on giving pt anti-anxiety medication and her BP is elevated. Pt may become drowsy, so meds given now when she is alert and oriented x 4.

## 2019-11-29 NOTE — Telephone Encounter (Signed)
[facility] called to say patient currently admitted to [facility].I will FYI B.Ahmed Prima, PA-C

## 2019-11-29 NOTE — Progress Notes (Addendum)
Hospitalist progress note   Patient from home, Patient going likely home, Dispo unclear at this time-we will need to get better and improved from biochemical and other parameters prior to discharge We will get therapy to see.  Kelsey Sandoval [id number] DOB: 08/18/1933 DOA: 11/24/2019  PCP: Kathyrn Drown, MD   Narrative:  39Y female COPD Gold stage III, chronic A. fib chads score >4 on Pradaxa with permanent pacemaker placement 05/2011 for tachybradycardia syndrome, pain (sciatica) with opiate habituation, bipolar, HTN, hypothyroid, mitral insufficiency Admitted to [facility] with bilateral lower extremity and neck swelling Recent cardiology telemetry visit 11/21/2019 with possible increase in torsemide-Baseline weight 147-also on once daily dose Pradaxa Found to have AKI, BNP 900  Data Reviewed:  BUN/creatinine baseline 29/1.55-->53/2.6-->56/2.8 WBC 24.4  Assessment & Plan:  Probable lower extremity cellulitis Possibly mediated by accumulation of fluid We will place TED hose and attempt to narrow antibiotics-likelihood of this being overt cellulitis without fever or chills is low and this may just be hemosiderin deposit mission and stasis dermatitis manifesting as the same Parotitis We will transition antibiotics to cover MRSA and broader coverage use also cefepime Severe hypoglycemia CBG 30 and then repeat was 40 given noncompliant 50 not known to be diabetic-change to regular diet and reassess-nursing to check every 4 hourly Decompensated HFpEF based on echo 10/31/2018 Aortic regurgitation Permanent atrial fibrillation status post pacemaker placement Continue metoprolol 100 twice daily for now-lisinopril 20 held, torsemide 40 held Continue dabigatran 75 twice daily Continue hydralazine 50 3 times daily Her weight is up to 173 whereas her baseline is 147 she will paradoxically need to be diuresed I will start her on Lasix cautiously at 40 mg IV twice daily and because of  inability to collect urine we will get a Foley catheter in place later today COPD previously not on oxygen currently using Monitor trends and desaturation screen as indicated Acute kidney injury superimposed on CKD stage III Etiology unclear however could be secondary to falling off from starting clear We will monitor trends Bipolar Quite anxious in the sense her norm apparently we will continue Cymbalta DR 60 daily in addition to Xanax 0.25 twice daily to help her relax Her home meds also include buspirone 5 3 times daily and hydroxyzine 25 every 8-I will add back the hydroxyzine at a lower dose of 10 Holding off on Paxil, buspirone HTN See above med changes Hypothyroid Continue Synthroid 75 mcg daily Opiate habituation  I had a long chat with her daughter on telephone with regards to her multiple medical issues on 2/24   Subjective: Quite anxious but does orient and can tell me where she is knows that she is in [place] tells me that she lives with her daughter Nursing tells me that overnight she was quite confused and was seeing things on the walls She is not having any chest pain Her daughter tells me that it is normal for her to be somewhat anxious in the morning when she is on oxygen as she feels she cannot breathe  Consultants:   None  Objective: Vitals:   11/07/2019 2143 11/29/19 0030 11/29/19 0230 11/29/19 0342  BP: (!) 159/106 (!) 173/91 (!) 141/92 (!) 138/103  Pulse: 90 71 88 87  Resp: (!) 24 18 20 20   Temp: (!) 97.5 F (36.4 C)   97.8 F (36.6 C)  TempSrc: Oral   Oral  SpO2: 100% 99% 97% 94%  Weight:      Height:  Intake/Output Summary (Last 24 hours) at 11/29/2019 0732 Last data filed at 11/29/2019 0600 Gross per 24 hour  Intake 178.56 ml  Output --  Net 178.56 ml   Filed Weights   11/11/2019 2141  Weight: 74.8 kg    Examination: EOMI NCAT no focal deficit mild JVD at baseline Parotid gland is pretty swollen on the left side and hard S1-S2 no  murmur rub or gallop regular rate rhythm Abdomen soft no rebound no guarding deficit Lower extremity edema present ankles are wrapped Neurologically intact moving all 4 limbs equally   Scheduled Meds: . dabigatran  75 mg Oral Q12H  . DULoxetine  60 mg Oral Daily  . gabapentin  300 mg Oral QID  . hydrALAZINE  50 mg Oral TID  . levothyroxine  75 mcg Oral Q0600  . metoprolol tartrate  100 mg Oral BID  . mometasone-formoterol  2 puff Inhalation BID  . pantoprazole  40 mg Oral Daily  . pravastatin  80 mg Oral QPM  . sodium chloride flush  3 mL Intravenous Q12H   Continuous Infusions: . sodium chloride    . metroNIDAZOLE       LOS: 0 days   Time spent: [place], MD [facility]  11/29/2019, 7:32 AM

## 2019-11-29 NOTE — Progress Notes (Signed)
Pharmacy Antibiotic Note  Kelsey Sandoval is a 86 y.o. female admitted on 12/03/2019 with parotitis.  Pharmacy has been consulted for Vancomycin dosing.  Plan: Vancomycin 1500mg  IV loading dose, then 1000mg  IV every 48 hours.  Goal trough 15-20 mcg/mL.  Ceftriaxone 1gm IV q24h Flagyl 500mg  IV q8h F/U  cxs and clinical progress Monitor V/S, labs and levels as indicated  Height: 5\' 9"  (175.3 cm) Weight: 165 lb (74.8 kg) IBW/kg (Calculated) : 66.2  Temp (24hrs), Avg:97.7 F (36.5 C), Min:97.5 F (36.4 C), Max:97.8 F (36.6 C)  Recent Labs  Lab 11/22/2019 2225 11/29/19 0830  WBC 15.9* 24.4*  CREATININE 2.68* 2.83*    Estimated Creatinine Clearance: 15.5 mL/min (A) (by C-G formula based on SCr of 2.83 mg/dL (H)).    Allergies  Allergen Reactions  . Penicillins Other (See Comments)    Caused patient to pass out.Can take cephalosporins Has patient had a PCN reaction causing immediate rash, facial/tongue/throat swelling, SOB or lightheadedness with hypotension: no Has patient had a PCN reaction causing severe rash involving mucus membranes or skin necrosis: No Has patient had a PCN reaction that required hospitalization No Has patient had a PCN reaction occurring within the last 10 years: No If all of the above answers are "NO", then may proceed with Cephalosporin use.   . Levaquin [Levofloxacin] Nausea Only  . Sulfa Antibiotics Other (See Comments)    Unknown  . Zithromax [Azithromycin] Nausea Only and Rash    Antimicrobials this admission: Vancomycin 2/24>>  ceftriaxone 2/24 >>  Flagyl 2/24>>  Microbiology results: 2/24 [id number] 2/24 MRSA PCR: positive  Thank you for allowing pharmacy to be a part of this patient's care.  Cristy Friedlander 11/29/2019 1:45 PM

## 2019-11-29 NOTE — Progress Notes (Signed)
Pt is sleeping in bed and shows no visible signs of distress. Daughter remains in the room and voices no complaints at this time.

## 2019-11-29 NOTE — Progress Notes (Signed)
Lab called and said pt is positive for MRSA. MD was notified and contact drape was placed on the door. Will alert Charge of this new result as well.

## 2019-11-30 ENCOUNTER — Inpatient Hospital Stay (HOSPITAL_COMMUNITY): Payer: PPO

## 2019-11-30 LAB — GLUCOSE, CAPILLARY
Glucose-Capillary: 113 mg/dL — ABNORMAL HIGH (ref 70–99)
Glucose-Capillary: 93 mg/dL (ref 70–99)
Glucose-Capillary: 118 mg/dL — ABNORMAL HIGH (ref 70–99)
Glucose-Capillary: 35 mg/dL — CL (ref 70–99)
Glucose-Capillary: 49 mg/dL — ABNORMAL LOW (ref 70–99)
Glucose-Capillary: 141 mg/dL — ABNORMAL HIGH (ref 70–99)
Glucose-Capillary: <10 mg/dL — CL (ref 70–99)

## 2019-11-30 LAB — COMPREHENSIVE METABOLIC PANEL
ALT: 22 U/L (ref 0–44)
AST: 30 U/L (ref 15–41)
Albumin: 1.7 g/dL — ABNORMAL LOW (ref 3.5–5.0)
Alkaline Phosphatase: 102 U/L (ref 38–126)
Anion gap: 14 (ref 5–15)
BUN: 59 mg/dL — ABNORMAL HIGH (ref 8–23)
CO2: 23 mmol/L (ref 22–32)
Calcium: 7.8 mg/dL — ABNORMAL LOW (ref 8.9–10.3)
Chloride: 100 mmol/L (ref 98–111)
Creatinine, Ser: 3.17 mg/dL — ABNORMAL HIGH (ref 0.44–1.00)
GFR calc Af Amer: 15 mL/min — ABNORMAL LOW (ref >60)
GFR calc non Af Amer: 13 mL/min — ABNORMAL LOW (ref >60)
Glucose, Bld: 110 mg/dL — ABNORMAL HIGH (ref 70–99)
Potassium: 4.2 mmol/L (ref 3.5–5.1)
Sodium: 137 mmol/L (ref 135–145)
Total Bilirubin: 0.6 mg/dL (ref 0.3–1.2)
Total Protein: 5.2 g/dL — ABNORMAL LOW (ref 6.5–8.1)

## 2019-11-30 LAB — BLOOD GAS, ARTERIAL
Acid-base deficit: 1.9 mmol/L (ref 0.0–2.0)
Bicarbonate: 23.1 mmol/L (ref 20.0–28.0)
FIO2: 100
O2 Saturation: 99.2 %
Patient temperature: 37
pCO2 arterial: 35.5 mmHg (ref 32.0–48.0)
pH, Arterial: 7.409 (ref 7.350–7.450)
pO2, Arterial: 211 mmHg — ABNORMAL HIGH (ref 83.0–108.0)

## 2019-11-30 LAB — CBC WITH DIFFERENTIAL/PLATELET
Abs Immature Granulocytes: 0.21 10*3/uL — ABNORMAL HIGH (ref 0.00–0.07)
Basophils Absolute: 0 10*3/uL (ref 0.0–0.1)
Basophils Relative: 0 %
Eosinophils Absolute: 0 10*3/uL (ref 0.0–0.5)
Eosinophils Relative: 0 %
HCT: 39.8 % (ref 36.0–46.0)
Hemoglobin: 12.5 g/dL (ref 12.0–15.0)
Immature Granulocytes: 1 %
Lymphocytes Relative: 6 %
Lymphs Abs: 1.3 10*3/uL (ref 0.7–4.0)
MCH: 31.4 pg (ref 26.0–34.0)
MCHC: 31.4 g/dL (ref 30.0–36.0)
MCV: 100 fL (ref 80.0–100.0)
Monocytes Absolute: 0.8 10*3/uL (ref 0.1–1.0)
Monocytes Relative: 4 %
Neutro Abs: 20.6 10*3/uL — ABNORMAL HIGH (ref 1.7–7.7)
Neutrophils Relative %: 89 %
Platelets: 227 10*3/uL (ref 150–400)
RBC: 3.98 MIL/uL (ref 3.87–5.11)
RDW: 14.9 % (ref 11.5–15.5)
WBC: 22.9 10*3/uL — ABNORMAL HIGH (ref 4.0–10.5)
nRBC: 0 % (ref 0.0–0.2)

## 2019-11-30 LAB — URINALYSIS, ROUTINE W REFLEX MICROSCOPIC
Bilirubin Urine: NEGATIVE
Glucose, UA: NEGATIVE mg/dL
Ketones, ur: NEGATIVE mg/dL
Nitrite: NEGATIVE
Protein, ur: >=300 mg/dL — AB
Specific Gravity, Urine: 1.013 (ref 1.005–1.030)
WBC, UA: >50 WBC/hpf — ABNORMAL HIGH (ref 0–5)
pH: 5 (ref 5.0–8.0)

## 2019-11-30 LAB — RESPIRATORY PANEL BY RT PCR (FLU A&B, COVID)
Influenza A by PCR: NEGATIVE
Influenza B by PCR: NEGATIVE
SARS Coronavirus 2 by RT PCR: NEGATIVE

## 2019-11-30 LAB — CREATININE, URINE, RANDOM: Creatinine, Urine: 88.86 mg/dL

## 2019-11-30 LAB — SODIUM, URINE, RANDOM: Sodium, Ur: 25 mmol/L

## 2019-11-30 LAB — PROTEIN / CREATININE RATIO, URINE
Creatinine, Urine: 87.5 mg/dL
Protein Creatinine Ratio: 4.27 mg/mg{Cre} — ABNORMAL HIGH (ref 0.00–0.15)
Total Protein, Urine: 374 mg/dL

## 2019-11-30 MED ORDER — SODIUM CHLORIDE 0.9 % IV SOLN: 250.0000 mL | INTRAVENOUS | Status: DC | PRN

## 2019-11-30 MED ORDER — SODIUM CHLORIDE 0.9 % IV SOLN: INTRAVENOUS | Status: DC

## 2019-11-30 MED ORDER — SODIUM CHLORIDE 0.9 % IV SOLN
2.0000 g | INTRAVENOUS | Status: DC
  Administered 2019-12-01: 2 g via INTRAVENOUS
  Filled 2019-11-30: qty 20

## 2019-11-30 MED ORDER — SODIUM CHLORIDE 0.9 % IV BOLUS
500.0000 mL | Freq: Once | INTRAVENOUS | Status: AC
  Administered 2019-11-30: 500 mL via INTRAVENOUS

## 2019-11-30 MED ORDER — FENTANYL CITRATE (PF) 100 MCG/2ML IJ SOLN
12.5000 ug | INTRAMUSCULAR | Status: DC | PRN
  Administered 2019-11-30 – 2019-12-01 (×2): 12.5 ug via INTRAVENOUS
  Filled 2019-11-30 (×2): qty 2

## 2019-11-30 MED ORDER — ORAL CARE MOUTH RINSE
15.0000 mL | Freq: Two times a day (BID) | OROMUCOSAL | Status: DC
  Administered 2019-11-30 – 2019-12-02 (×5): 15 mL via OROMUCOSAL

## 2019-11-30 MED ORDER — DEXTROSE 50 % IV SOLN
INTRAVENOUS | Status: AC
  Filled 2019-11-30: qty 50

## 2019-11-30 MED ORDER — PHENOL 1.4 % MT LIQD
1.0000 | OROMUCOSAL | Status: DC | PRN
  Administered 2019-11-30: 1 via OROMUCOSAL
  Filled 2019-11-30: qty 177

## 2019-11-30 MED ORDER — ALUM & MAG HYDROXIDE-SIMETH 200-200-20 MG/5ML PO SUSP
30.0000 mL | Freq: Four times a day (QID) | ORAL | Status: DC | PRN
  Administered 2019-11-30: 30 mL via ORAL
  Filled 2019-11-30 (×2): qty 30

## 2019-11-30 MED ORDER — DEXTROSE 50 % IV SOLN
INTRAVENOUS | Status: AC
  Administered 2019-11-30: 50 mL via INTRAVENOUS
  Filled 2019-11-30: qty 50

## 2019-11-30 MED ORDER — HYDROCODONE-ACETAMINOPHEN 5-325 MG PO TABS
1.0000 | ORAL_TABLET | ORAL | Status: DC | PRN
  Administered 2019-11-30 (×2): 1 via ORAL
  Filled 2019-11-30 (×2): qty 1

## 2019-11-30 MED ORDER — METOPROLOL TARTRATE 25 MG PO TABS: 25.0000 mg | ORAL_TABLET | Freq: Two times a day (BID) | ORAL | Status: DC

## 2019-11-30 MED ORDER — CHLORHEXIDINE GLUCONATE CLOTH 2 % EX PADS
6.0000 | MEDICATED_PAD | Freq: Every day | CUTANEOUS | Status: DC
  Administered 2019-11-30 – 2019-12-02 (×3): 6 via TOPICAL

## 2019-11-30 NOTE — Progress Notes (Signed)
Pts right arm weeping, gauze changed out for patient as well as gown, pillow case and top sheet.  Pt refusing to turn because it "hurts" too bad. Pt and family educated that patient could get bed sore if she continues to refuse to turn every 2 hours. Pt and family verified understanding and patient still refusing. Will continue to monitor pt

## 2019-11-30 NOTE — Progress Notes (Signed)
PT Cancellation Note  Patient Details Name: Kelsey Sandoval MRN: [id number] DOB: 1932/11/21   Cancelled Treatment:    Reason Eval/Treat Not Completed: Medical issues which prohibited therapy;Patient not medically ready. Per RN, pt moving to step down unit due to medical need and not appropriate for PT evaluation today. Will continue to follow.    Tori Tyera Hansley PT, DPT 11/30/19, 11:24 AM [phone]

## 2019-11-30 NOTE — Progress Notes (Addendum)
Hypoglycemic Event  CBG: <10  Treatment: Dextrose 50%   Symptoms: N/a- pt alert and oriented/talking  Follow-up CBG: Time:2114 CBG Result:141  Possible Reasons for Event: Pt not eating  Comments/MD notified: n/a    Tim Lair

## 2019-11-30 NOTE — Progress Notes (Signed)
Kelsey Corpus, MD paged to see if chloraseptic spray could be ordered. Pt c/o sore throat and can not swallow. Waiting for orders/call back. Will continue to monitor pt

## 2019-11-30 NOTE — Progress Notes (Signed)
Pt had one white watch that was removed from left wrist and placed in pt's handbag that was placed in pt's belonging bag.

## 2019-11-30 NOTE — Consult Note (Signed)
Kelsey Sandoval Admit Date: 11/24/2019 11/30/2019 Rexene Agent Requesting Physician:  Verlon Au MD  Reason for Consult:  AKI on CKD3 HPI:  69F admitted on 2/23 presenting with leg swelling, poor oral intake, and concern for acute parotitis4 (confirmed on 2/23 CT, noncontrasted), and lower extremity cellulitis.  Other past history includes diastolic heart failure, depression, OA, COPD, atrial fibrillation on DOAC, hypertension (medications include lisinopril and torsemide), GERD.  Further she has a history of membranous nephropathy with biopsy in 2015 demonstrating class III-IV disease with significant scarring present.  She has follow-up with Dr. Lowanda Foster.  She appears to have baseline CKD 3 with a creatinine of 1.3-1.5.  Presenting creatinine was 2.7 and has worsened to 3.17 here today.  She is a SNF resident.  She is currently receiving ceftriaxone, vancomycin, and metronidazole.  No UA.  No renal imaging.  Outpatient medications include gabapentin 1200 mg total daily dose, lisinopril 20 mg daily, metoprolol 100 mg twice daily, torsemide 40 mg daily, PPI.  Blood pressures have been on the low side since presentation.  All antihypertensive medications have been held.  Given concern for hypervolemia with peripheral edema diuresis was attempted yesterday but patient made very little urine.  Subsequently, given worsening of renal function she has transitioned to some IV fluids receiving 1/2 L of LR overnight and starting on normal saline here today.  Patient is unable to eat and drink very much because of the parotitis.  She has significant pain with swallowing.  She was admitted in December and had a discharge weight of 71.7 kg, recorded weight here 74.8 kg at admission.  I am not sure how accurate these are.  She has a Foley catheter.  Most recent albumin is 1.7.  Creat (mg/dL)  Date Value  10/13/2016 1.18 (H)  02/27/2014 1.24 (H)  07/11/2013 1.30 (H)  02/20/2013 1.02   Creatinine, Ser (mg/dL)   Date Value  11/30/2019 3.17 (H)  11/29/2019 2.83 (H)  11/19/2019 2.68 (H)  09/19/2019 1.55 (H)  09/16/2019 1.47 (H)  09/04/2019 1.54 (H)  08/21/2019 1.54 (H)  04/11/2019 1.34 (H)  12/19/2018 2.03 (H)  12/14/2018 2.35 (H)  ] I/Os: I/O last 3 completed shifts: In: 698.6 [P.O.:120; IV Piggyback:578.6] Out: 450 [Urine:450]   ROS NSAIDS: No exposure IV Contrast no exposure TMP/SMX no exposure Hypotension currently present Balance of 12 systems is negative w/ exceptions as above  PMH  Past Medical History:  Diagnosis Date  . Anxiety   . Aortic atherosclerosis ([place]) 10/10/2018  . Arthritis   . Back pain, chronic    Sciatica  . Cataracts, bilateral   . COPD (chronic obstructive pulmonary disease) ([place]) 10/10/2018  . Depression   . Dysrhythmia    AFib  . Essential hypertension   . GERD (gastroesophageal reflux disease)   . Hyperlipidemia   . Hypothyroidism   . Membranous nephropathy determined by biopsy 06/07/2015  . Nephrotic syndrome    RHUX  . Neuropathy   . Osteopenia   . Pacemaker    [employer]  . PAF (paroxysmal atrial fibrillation) ([place])   . Pre-diabetes   . Tachycardia-bradycardia syndrome ([place])    [place]  Past Surgical History:  Procedure Laterality Date  . ABDOMINAL HYSTERECTOMY     partial-pt has no ovaries  . ANTERIOR VITRECTOMY Right 03/11/2018   Procedure: ANTERIOR VITRECTOMY;  Surgeon: Baruch Goldmann, MD;  Location: [facility];  Service: Ophthalmology;  Laterality: Right;  . APPENDECTOMY    . BACK SURGERY    . BIOPSY N/A 01/17/2014  Procedure: BIOPSY;  Surgeon: Rogene Houston, MD;  Location: [facility];  Service: Endoscopy;  Laterality: N/A;  . CATARACT EXTRACTION W/PHACO Left 02/28/2015   Procedure: CATARACT EXTRACTION PHACO AND INTRAOCULAR LENS PLACEMENT (IOC);  Surgeon: [name], MD;  Location: [facility];  Service: Ophthalmology;  Laterality: Left;  CDE 18.71  . CATARACT EXTRACTION W/PHACO Right 03/11/2018   Procedure: CATARACT EXTRACTION PHACO  AND  INTRAOCULAR LENS PLACEMENT RIGHT EYE ;  Surgeon: Baruch Goldmann, MD;  Location: [facility];  Service: Ophthalmology;  Laterality: Right;  CDE: 19.39  . COLONOSCOPY  9/05  . COLONOSCOPY N/A 10/11/2013   Procedure: COLONOSCOPY;  Surgeon: Rogene Houston, MD;  Location: [facility];  Service: Endoscopy;  Laterality: N/A;  1200  . ESOPHAGOGASTRODUODENOSCOPY N/A 01/17/2014   Procedure: ESOPHAGOGASTRODUODENOSCOPY (EGD);  Surgeon: Rogene Houston, MD;  Location: [facility];  Service: Endoscopy;  Laterality: N/A;  230  . ESOPHAGOGASTRODUODENOSCOPY N/A 01/02/2016   Procedure: ESOPHAGOGASTRODUODENOSCOPY (EGD);  Surgeon: Rogene Houston, MD;  Location: [facility];  Service: Endoscopy;  Laterality: N/A;  240  . INSERT / REPLACE / REMOVE PACEMAKER     2012  . Wisdom tooth extracton     FH  Family History  Problem Relation Age of Onset  . Colon cancer Brother   . Anxiety disorder Sister   . Depression Sister   . Anxiety disorder Sister   . Depression Sister    SH  reports that she has never smoked. She has never used smokeless tobacco. She reports that she does not drink alcohol or use drugs. Allergies  Allergies  Allergen Reactions  . Penicillins Other (See Comments)    Caused patient to pass out.Can take cephalosporins Has patient had a PCN reaction causing immediate rash, facial/tongue/throat swelling, SOB or lightheadedness with hypotension: no Has patient had a PCN reaction causing severe rash involving mucus membranes or skin necrosis: No Has patient had a PCN reaction that required hospitalization No Has patient had a PCN reaction occurring within the last 10 years: No If all of the above answers are "NO", then may proceed with Cephalosporin use.   . Levaquin [Levofloxacin] Nausea Only  . Sulfa Antibiotics Other (See Comments)    Unknown  . Zithromax [Azithromycin] Nausea Only and Rash   Home medications Prior to Admission medications   Medication Sig Start Date End Date Taking?  Authorizing Provider  ALPRAZolam Duanne Moron) 0.5 MG tablet Take 0.5 mg by mouth every 12 (twelve) hours as needed for anxiety.   Yes [provider]  budesonide-formoterol (SYMBICORT) 160-4.5 MCG/ACT inhaler Inhale 2 puffs into the lungs 2 (two) times daily. 09/04/19  Yes Kathyrn Drown, MD  dabigatran (PRADAXA) 75 MG CAPS capsule Take 1 capsule (75 mg total) by mouth 2 (two) times daily. 11/21/19  Yes Strader, Fransisco Hertz, PA-C  DULoxetine (CYMBALTA) 60 MG capsule Take 1 capsule (60 mg total) by mouth daily. 06/02/19  Yes Cloria Spring, MD  gabapentin (NEURONTIN) 300 MG capsule TAKE 1 CAPSULE BY MOUTH THREE TIMES DAILY Patient taking differently: Take 300 mg by mouth 4 (four) times daily.  03/31/19  Yes Kathyrn Drown, MD  HYDROcodone-acetaminophen (NORCO/VICODIN) 5-325 MG tablet Take 1 tablet by mouth every 6 (six) hours as needed for moderate pain or severe pain. Patient taking differently: Take 1 tablet by mouth in the morning and at bedtime. *May take 1 tablet every 6 hours as needed for pain in addition to maintenance dose 09/20/19 09/19/20 Yes Roxan Hockey, MD  levothyroxine (SYNTHROID) 75 MCG tablet Take 75 mcg by mouth daily before breakfast.   Yes [provider]  lisinopril (ZESTRIL) 20 MG tablet Take 1 tablet (20 mg total) by mouth daily. 09/21/19  Yes Emokpae, Courage, MD  metoprolol tartrate (LOPRESSOR) 100 MG tablet Take 100 mg by mouth 2 (two) times daily.   Yes [provider]  ondansetron (ZOFRAN) 8 MG tablet Take 1 tablet (8 mg total) by mouth every 8 (eight) hours as needed for nausea. 09/04/19  Yes Kathyrn Drown, MD  OXYGEN Inhale 2 L into the lungs continuous.   Yes [provider]  PARoxetine (PAXIL) 10 MG tablet Take 10 mg by mouth daily.   Yes [provider]  pravastatin (PRAVACHOL) 80 MG tablet Take 1 tablet (80 mg total) by mouth every evening. 05/01/19  Yes Luking, Elayne Snare, MD  PROAIR HFA 108 (90 Base) MCG/ACT inhaler INHALE 2  PUFFS INTO LUNGS EVERY 6 HOURS AS NEEDED FOR WHEEZING OR SHORTNESS OF BREATH Patient taking differently: Inhale 2 puffs into the lungs every 8 (eight) hours as needed for wheezing or shortness of breath.  11/21/18  Yes Kathyrn Drown, MD  torsemide (DEMADEX) 20 MG tablet Take 2 tablets (40 mg total) by mouth daily. 09/21/19  Yes Emokpae, Courage, MD  pantoprazole (PROTONIX) 40 MG tablet Take 1 tablet (40 mg total) by mouth daily. Patient taking differently: Take 40 mg by mouth daily as needed (for GERD).  08/11/19   Kathyrn Drown, MD    Current Medications Scheduled Meds: . Chlorhexidine Gluconate Cloth  6 each Topical Daily  . dabigatran  75 mg Oral Q12H  . DULoxetine  60 mg Oral Daily  . gabapentin  200 mg Oral BID  . levothyroxine  75 mcg Oral Q0600  . mometasone-formoterol  2 puff Inhalation BID  . pantoprazole  40 mg Oral Daily  . pravastatin  80 mg Oral QPM  . sodium chloride flush  3 mL Intravenous Q12H   Continuous Infusions: . sodium chloride    . sodium chloride    . cefTRIAXone (ROCEPHIN)  IV 1 g (11/30/19 0406)  . metroNIDAZOLE 500 mg (11/30/19 3474)  . [START ON 12/01/2019] vancomycin     PRN Meds:.sodium chloride, acetaminophen **OR** acetaminophen, albuterol, ALPRAZolam, dextrose, HYDROcodone-acetaminophen, hydrOXYzine, ondansetron **OR** ondansetron (ZOFRAN) IV, sodium chloride flush  CBC Recent Labs  Lab 11/20/2019 2225 11/29/19 0830 11/30/19 0600  WBC 15.9* 24.4* 22.9*  NEUTROABS  --  22.2* 20.6*  HGB 13.6 12.4 12.5  HCT 43.3 39.2 39.8  MCV 100.0 100.0 100.0  PLT 237 226 259   Basic Metabolic Panel Recent Labs  Lab 11/27/2019 2225 11/29/19 0830 11/30/19 0600  NA 136 136 137  K 3.9 4.2 4.2  CL 99 98 100  CO2 24 25 23   GLUCOSE 82 94 110*  BUN 53* 56* 59*  CREATININE 2.68* 2.83* 3.17*  CALCIUM 7.8* 7.7* 7.8*    Physical Exam  Blood pressure (!) 91/53, pulse 87, temperature 97.7 F (36.5 C), temperature source Oral, resp. rate 20, height 5\' 9"   (1.753 m), weight 74.8 kg, SpO2 92 %. GEN: NAD ENT: L mandibular swelling/tenderness EYES: EOMI CV: irregular, normal rate PULM: diminished throughout esp in bases ABD: s/nt, nd SKIN: trace LEE, significant hyperpigmentation, some superficial ulcers, no intense erythema EXT: As above   Assessment 54F AoCKD3 acute parotitis with poor oral intake, ongoing use of ACE inhibitor/loop diuretic, potential cellulitis on broad-spectrum antibiotics; significant comorbidities including history of membranous nephropathy  1. AoCKD3: Oliguric.  Baseline creatinine around 1.5.  Suspect cause is ATN related to hypovolemia driven by #2, hypotension, ongoing use of ACE inhibitor/diuretic. 2. Acute parotitis, per TRH, ceftriaxone, vancomycin, metronidazole 3. Potential cellulitis of lower extremities, antibiotics as above; blood cultures no growth to date 4. History of biopsy-proven membranous nephropathy, stage III/IV, followed by Dr. Lowanda Foster, biopsy in 2015, albumin 1.7 5. Diastolic heart failure, TTE yesterday LVEF 70 to 75% 6. Atrial fibrillation on DOAC, rate control 7. Bipolar disorder 8. Hypertension, not currently present 9. COPD 10. Severe protein calorie malnutrition with albumin of 1.7 11. Leukocytosis  Plan 1. Check UA, UPC 2. Renal ultrasound 3. Continue to hold ACE inhibitor, diuretics 4. I do not think that she has volume overload at the current time, and agree with very careful hydration; but I do not think that her kidney injury is pure prerenal/hypovolemia either 5. Suspect that her protein calorie malnutrition is mostly driven by her comorbidities, less likely would be an acute exacerbation/relapse of her membranous disease 6. Daily weights, Daily Renal Panel, Strict I/Os, Avoid nephrotoxins (NSAIDs, judicious IV Contrast) 7. Will follow closely   Rexene Agent  [phone] 11/30/2019, 9:27 AM

## 2019-11-30 NOTE — TOC Initial Note (Signed)
Transition of Care [facility]) - Initial/Assessment Note    Patient Details  Name: Kelsey Sandoval MRN: [id number] Date of Birth: 1932-11-03  Transition of Care [facility]) CM/SW Contact:    Shade Flood, LCSW Phone Number: 11/30/2019, 12:47 PM  Clinical Narrative:                  Pt admitted from [facility]. Pt was hospitalized at [facility] in December and discharged to [facility] for short term rehab. Pt has since returned to her primary living arrangement at [facility].   Pt transferred to ICU today after respiratory distress on the unit.  TOC will follow and await recommendations for dc needs.  Expected Discharge Plan: Skilled Nursing Facility Barriers to Discharge: Continued Medical Work up   Patient Goals and CMS Choice        Expected Discharge Plan and Services Expected Discharge Plan: [place] In-house Referral: Clinical Social Work     Living arrangements for the past 2 months: [place]                                      Prior Living Arrangements/Services Living arrangements for the past 2 months: [place] Lives with:: Facility Resident Patient language and need for interpreter reviewed:: Yes        Need for Family Participation in Patient Care: No (Comment) Care giver support system in place?: Yes (comment)   Criminal Activity/Legal Involvement Pertinent to Current Situation/Hospitalization: No - Comment as needed  Activities of Daily Living Home Assistive Devices/Equipment: Eyeglasses, Wheelchair ADL Screening (condition at time of admission) Patient's cognitive ability adequate to safely complete daily activities?: Yes Is the patient deaf or have difficulty hearing?: No Does the patient have difficulty seeing, even when wearing glasses/contacts?: No Does the patient have difficulty concentrating, remembering, or making decisions?: No Patient able to express need for assistance with ADLs?: Yes Does the  patient have difficulty dressing or bathing?: Yes Independently performs ADLs?: No Communication: Independent Dressing (OT): Needs assistance Is this a change from baseline?: Pre-admission baseline Grooming: Needs assistance Is this a change from baseline?: Pre-admission baseline Feeding: Independent Bathing: Needs assistance Is this a change from baseline?: Pre-admission baseline Toileting: Independent In/Out Bed: Independent Walks in Home: Independent with device (comment) Does the patient have difficulty walking or climbing stairs?: Yes Weakness of Legs: Both Weakness of Arms/Hands: None  Permission Sought/Granted                  Emotional Assessment       Orientation: : Oriented to Self Alcohol / Substance Use: Not Applicable Psych Involvement: No (comment)  Admission diagnosis:  Acute diastolic heart failure (HCC) [I50.31] Parotitis [K11.20] Cellulitis of multiple sites [L03.90] AKI (acute kidney injury) ([place]) [N17.9] Cellulitis of lower extremity, unspecified laterality [K93.267] Patient Active Problem List   Diagnosis Date Noted  . Cellulitis of multiple sites 11/29/2019  . Acute parotitis 11/29/2019  . Pre-diabetes   . Acute renal failure superimposed on stage 3b chronic kidney disease ([place])   . Cardiac volume overload   . Falls frequently 09/16/2019  . Aortic atherosclerosis ([place]) 10/10/2018  . COPD (chronic obstructive pulmonary disease) ([place]) 10/10/2018  . Hypertensive urgency 07/31/2018  . Atrial fibrillation with RVR ([place]) 01/20/2017  . Nausea 01/20/2017  . Anxiety 01/20/2017  . Acute bronchitis 09/11/2016  . Chronic anticoagulation 09/07/2016  . Chronic kidney disease (CKD), stage  III (moderate) 09/07/2016  . Hereditary and idiopathic peripheral neuropathy 03/05/2016  . Major depression 12/04/2015  . Major depression in remission ([place]) 09/02/2015  . Membranous nephropathy determined by biopsy 06/07/2015  . Osteoarthritis of both knees  03/26/2015  . Chronic pain syndrome 03/26/2015  . Intractable nausea and vomiting   . Nausea with vomiting   . Malnutrition of moderate degree ([place]) 03/08/2015  . UTI (lower urinary tract infection) 03/07/2015  . Hypothyroidism 02/14/2015  . Disorder of kidney 01/17/2015  . Nephrotic syndrome 06/21/2014  . Proteinuria 03/29/2014  . Osteopenia 03/29/2014  . Anemia, iron deficiency 08/22/2013  . Mitral insufficiency 06/11/2013  . Pacemaker 06/11/2013  . Paroxysmal atrial fibrillation ([place]) 09/26/2012  . Tachycardia-bradycardia syndrome ([place]) 09/26/2012  . Sick sinus syndrome ([place]) 09/26/2012  . Systemic hypertension 09/26/2012  . Dyslipidemia 09/26/2012   PCP:  Kathyrn Drown, MD Pharmacy:   [place], [place] - [address] [place] [address] [place] [address] [place] [address] Phone: [phone] Fax: [phone]  [place], [place] - [address] [place] [address] Phone: [phone] Fax: [phone]     Social Determinants of Health (SDOH) Interventions    Readmission Risk Interventions Readmission Risk Prevention Plan 11/30/2019  Transportation Screening Complete  Medication Review Press photographer) Complete  Some recent data might be hidden

## 2019-11-30 NOTE — Progress Notes (Signed)
Hospitalist progress note   Patient from home, Patient going likely home, Dispo unclear at this time-we will need to get better and improved from biochemical and other parameters prior to discharge We will get therapy to see.  Kelsey Sandoval [id number] DOB: 1932/11/03 DOA: 11/19/2019  PCP: Kathyrn Drown, MD     Narrative:  76Y female COPD Gold stage III, chronic A. fib chads score >4 on Pradaxa with permanent pacemaker placement 05/2011 for tachybradycardia syndrome, pain (sciatica) with opiate habituation, bipolar, HTN, hypothyroid, mitral insufficiency Admitted to [facility] with bilateral lower extremity and neck swelling Recent cardiology telemetry visit 11/21/2019 with possible increase in torsemide-Baseline weight 147-also on once daily dose Pradaxa Found to have AKI, BNP 900  Data Reviewed:  BUN/creatinine baseline 29/1.55-->53/2.6-->56/2.8-->59/3.17 WBC 24.4-->22.9  Assessment & Plan:  Probable lower extremity cellulitis Place Unna boots and continue antibiotics Parotitis We will transition antibiotics to cover MRSA and broader coverage use also cefepime-White count is somewhat better Severe hypoglycemia on admission CBG now better and resolved in the 93-1 03 range Decompensated HFpEF based on echo 10/31/2018 Aortic regurgitation the nurse for 34 Permanent atrial fibrillation status post pacemaker placement Hypotension on 2/25 Medications adjusted-placed on metoprolol 25 and discontinued all other antihypertensives Continue dabigatran 75 twice daily Her weight is up to 173 whereas her baseline is 147 she will paradoxically need to be diuresed Stopping diuretics.today monitor trends of kidney function get FE urea and will consider ultrasound If worsens will need to speak with nephrology COPD previously not on oxygen currently using Monitor trends and desaturation screen as indicated Acute kidney injury superimposed on CKD stage III Etiology unclear however could be  secondary to falling off from starting clear We will monitor trends Bipolar  continue Cymbalta DR 60 daily in addition to Xanax 0.25 twice daily to help her relax Her home meds also include buspirone 5 3 times daily and hydroxyzine 25 every 8-I will add back the hydroxyzine at a lower dose of 10 Holding off on Paxil, buspirone HTN See above med changes Hypothyroid Continue Synthroid 75 mcg daily Opiate habituation  Addendum-I was called after leaving [facility] as I was flexing between [facility] and [place] long above patient requiring no 15 L of oxygen, inability to raise her O2 sats and patient becoming less responsive-please see separate note as per Dr. Denton Brick who will assume care of this patient in my absence   Subjective: Awake alert tells me he wants to get out of bed seems somewhat anxious No chest pain No fever No chills  Consultants:   None  Objective: Vitals:   11/29/19 2200 11/30/19 0100 11/30/19 0510 11/30/19 0512  BP: (!) 91/57 (!) 131/58 (!) 83/52 (!) 83/52  Pulse: 76 74  90  Resp: (!) 22 (!) 22 20 20   Temp: 97.6 F (36.4 C) 97.6 F (36.4 C) 97.7 F (36.5 C) 97.7 F (36.5 C)  TempSrc: Oral Oral Oral Oral  SpO2: 94%  92% 92%  Weight:      Height:        Intake/Output Summary (Last 24 hours) at 11/30/2019 0835 Last data filed at 11/30/2019 3151 Gross per 24 hour  Intake 520 ml  Output 450 ml  Net 70 ml   Filed Weights   11/09/2019 2141  Weight: 74.8 kg    Examination: Seems a little sleepy Swelling in the upper face on the left side seems a little bit improved-cannot see any purulence coming out of parotid duct Neck is  soft supple CTA B no added sound no rales no rhonchi Lower extremities are soft but do have wounds bilaterally Neurologically intact no focal deficit   Scheduled Meds: . Chlorhexidine Gluconate Cloth  6 each Topical Daily  . dabigatran  75 mg Oral Q12H  . DULoxetine  60 mg Oral Daily  . gabapentin  200 mg Oral BID  .  levothyroxine  75 mcg Oral Q0600  . metoprolol tartrate  25 mg Oral BID  . mometasone-formoterol  2 puff Inhalation BID  . pantoprazole  40 mg Oral Daily  . pravastatin  80 mg Oral QPM  . sodium chloride flush  3 mL Intravenous Q12H   Continuous Infusions: . sodium chloride    . cefTRIAXone (ROCEPHIN)  IV 1 g (11/30/19 0406)  . metroNIDAZOLE 500 mg (11/30/19 5993)  . [START ON 12/01/2019] vancomycin       LOS: 1 day   Time spent: [place], MD [facility]  11/30/2019, 8:35 AM

## 2019-11-30 NOTE — Progress Notes (Signed)
PT Cancellation Note  Patient Details Name: Kelsey Sandoval MRN: [id number] DOB: Sep 28, 1933   Cancelled Treatment:    Reason Eval/Treat Not Completed: Medical issues which prohibited therapy.  .Patient transferred to a higher level of care and will need new PT consult resume therapy when patient is medically stable.  Thank you.   1:44 PM, 11/30/19 Lonell Grandchild, MPT Physical Therapist with [facility] [phone] office [phone] mobile phone

## 2019-11-30 NOTE — Progress Notes (Signed)
    Called to Eval pt due to Persistent Hypotension and Persistent Hypoxia----   Chart reviewed, d/w Dr. Verlon Au  -Rapid response team at bedside -Patient on nonrebreather bag Hypotensive and Anuric despite IV fluids - Echo EF 70 -75%-- Creatinine--- 2.68 >>>2.83>>>3.17  BP (!) 57/46   Pulse 96   Temp 97.7 F (36.5 C) (Oral)   Resp 18   Ht 5\' 9"  (1.753 m)   Wt 74.8 kg   SpO2 92%   BMI 24.37 kg/m   Social/Ethics-- Discussed with patient,  Conference at bedside with daughter [name]), son [name])  , daughter in Sports coach , son in Sports coach and granddaughter [name]  -Multiple/repeated conversations about plan of care, CODE STATUS, advanced directives and goals of care--other family members were on video chat as well -After multiple conversations/conferences at bedside -The Following decisions were made --- by patient and her family 1) pt is a DNR/DNI, declines Pressors for hypotension and possible septic shock, -She is requesting Limited scope of Treatment -No ACLS Rx , No Pressors, No Bipap, No Rapid Response -Okay to continue antibiotics and fluids ICU nurse manager Webb Silversmith was present for part of the conversation ICU RN Lilia Pro Dishmon was present for other part of the conversation  -critical care time due to persistent hypotension, respiratory distress and hypoxia was over 79 minutes -Patient transferred from telemetry unit to ICU/stepdown  - Roxan Hockey, MD

## 2019-12-01 ENCOUNTER — Ambulatory Visit: Payer: PPO | Admitting: Family Medicine

## 2019-12-01 LAB — CBC WITH DIFFERENTIAL/PLATELET
Abs Immature Granulocytes: 0.15 10*3/uL — ABNORMAL HIGH (ref 0.00–0.07)
Basophils Absolute: 0 10*3/uL (ref 0.0–0.1)
Basophils Relative: 0 %
Eosinophils Absolute: 0 10*3/uL (ref 0.0–0.5)
Eosinophils Relative: 0 %
HCT: 39.1 % (ref 36.0–46.0)
Hemoglobin: 11.9 g/dL — ABNORMAL LOW (ref 12.0–15.0)
Immature Granulocytes: 1 %
Lymphocytes Relative: 7 %
Lymphs Abs: 1.5 10*3/uL (ref 0.7–4.0)
MCH: 31.3 pg (ref 26.0–34.0)
MCHC: 30.4 g/dL (ref 30.0–36.0)
MCV: 102.9 fL — ABNORMAL HIGH (ref 80.0–100.0)
Monocytes Absolute: 0.9 10*3/uL (ref 0.1–1.0)
Monocytes Relative: 4 %
Neutro Abs: 17.5 10*3/uL — ABNORMAL HIGH (ref 1.7–7.7)
Neutrophils Relative %: 88 %
Platelets: 196 10*3/uL (ref 150–400)
RBC: 3.8 MIL/uL — ABNORMAL LOW (ref 3.87–5.11)
RDW: 15.3 % (ref 11.5–15.5)
WBC: 20 10*3/uL — ABNORMAL HIGH (ref 4.0–10.5)
nRBC: 0 % (ref 0.0–0.2)

## 2019-12-01 LAB — COMPREHENSIVE METABOLIC PANEL
ALT: 20 U/L (ref 0–44)
AST: 28 U/L (ref 15–41)
Albumin: 1.5 g/dL — ABNORMAL LOW (ref 3.5–5.0)
Alkaline Phosphatase: 96 U/L (ref 38–126)
Anion gap: 13 (ref 5–15)
BUN: 63 mg/dL — ABNORMAL HIGH (ref 8–23)
CO2: 22 mmol/L (ref 22–32)
Calcium: 7.6 mg/dL — ABNORMAL LOW (ref 8.9–10.3)
Chloride: 102 mmol/L (ref 98–111)
Creatinine, Ser: 3.5 mg/dL — ABNORMAL HIGH (ref 0.44–1.00)
GFR calc Af Amer: 13 mL/min — ABNORMAL LOW (ref >60)
GFR calc non Af Amer: 11 mL/min — ABNORMAL LOW (ref >60)
Glucose, Bld: 92 mg/dL (ref 70–99)
Potassium: 3.8 mmol/L (ref 3.5–5.1)
Sodium: 137 mmol/L (ref 135–145)
Total Bilirubin: 0.7 mg/dL (ref 0.3–1.2)
Total Protein: 4.6 g/dL — ABNORMAL LOW (ref 6.5–8.1)

## 2019-12-01 LAB — GLUCOSE, CAPILLARY
Glucose-Capillary: 144 mg/dL — ABNORMAL HIGH (ref 70–99)
Glucose-Capillary: 19 mg/dL — CL (ref 70–99)
Glucose-Capillary: 80 mg/dL (ref 70–99)
Glucose-Capillary: 26 mg/dL — CL (ref 70–99)

## 2019-12-01 LAB — UREA NITROGEN, URINE: Urea Nitrogen, Ur: 293 mg/dL

## 2019-12-01 MED ORDER — SODIUM CHLORIDE 0.9 % IV BOLUS
500.0000 mL | Freq: Once | INTRAVENOUS | Status: AC
  Administered 2019-12-01: 500 mL via INTRAVENOUS

## 2019-12-01 MED ORDER — OLANZAPINE 5 MG PO TBDP
5.0000 mg | ORAL_TABLET | Freq: Every day | ORAL | Status: DC
  Administered 2019-12-02: 5 mg via ORAL
  Filled 2019-12-01 (×6): qty 1

## 2019-12-01 MED ORDER — SODIUM CHLORIDE 0.9 % IV SOLN: 250.0000 mL | INTRAVENOUS | Status: DC | PRN

## 2019-12-01 MED ORDER — DIPHENHYDRAMINE HCL 50 MG/ML IJ SOLN: 12.5000 mg | INTRAMUSCULAR | Status: DC | PRN

## 2019-12-01 MED ORDER — FENTANYL CITRATE (PF) 100 MCG/2ML IJ SOLN
25.0000 ug | INTRAMUSCULAR | Status: DC | PRN
  Administered 2019-12-01 – 2019-12-02 (×11): 25 ug via INTRAVENOUS
  Filled 2019-12-01 (×11): qty 2

## 2019-12-01 MED ORDER — DEXTROSE 10 % IV SOLN: INTRAVENOUS | Status: DC

## 2019-12-01 MED ORDER — POLYVINYL ALCOHOL 1.4 % OP SOLN: 1.0000 [drp] | Freq: Four times a day (QID) | OPHTHALMIC | Status: DC | PRN

## 2019-12-01 MED ORDER — SODIUM CHLORIDE 0.9 % IV SOLN
12.5000 mg | Freq: Four times a day (QID) | INTRAVENOUS | Status: DC | PRN
  Filled 2019-12-01: qty 0.5

## 2019-12-01 MED ORDER — SALINE SPRAY 0.65 % NA SOLN
1.0000 | NASAL | Status: DC | PRN
  Filled 2019-12-01: qty 44

## 2019-12-01 MED ORDER — LIDOCAINE VISCOUS HCL 2 % MT SOLN
15.0000 mL | OROMUCOSAL | Status: DC | PRN
  Filled 2019-12-01: qty 15

## 2019-12-01 MED ORDER — BIOTENE DRY MOUTH MT LIQD: 15.0000 mL | OROMUCOSAL | Status: DC | PRN

## 2019-12-01 MED ORDER — DOXYCYCLINE HYCLATE 100 MG PO TABS: 100.0000 mg | ORAL_TABLET | Freq: Two times a day (BID) | ORAL | Status: DC

## 2019-12-01 MED ORDER — BISACODYL 10 MG RE SUPP: 10.0000 mg | Freq: Every day | RECTAL | Status: DC | PRN

## 2019-12-01 NOTE — Progress Notes (Signed)
Patient expressed concern for spiritual comfort while facing life's challenges.  Patient smiled more as visit progressed and she stated that she was "less scared because she's really to see Jesus." She requested that I offered prayer for God's will and for her family. Her sister and niece were present during visit.

## 2019-12-01 NOTE — Progress Notes (Signed)
Patient Demographics:    Kelsey Sandoval, is a 86 y.o. female, DOB - Aug 31, 1933, KCM:[id number]  Admit date - 11/30/2019   Admitting Physician Reubin Milan, MD  Outpatient Primary MD for the patient is Kathyrn Drown, MD  LOS - 2   Chief Complaint  Patient presents with  . Leg Swelling        Subjective:    [name] today has no fevers, no emesis,  No chest pain,  -  --Had a very rough night with persistent/recurrent hypoglycemia and hypotension requiring dextrose infusion and IV fluid boluses - Family members at bedside, patient requesting more pain medications, patient is requesting transition to comfort care   Assessment  & Plan :    Principal Problem:   Cellulitis of multiple sites Active Problems:   Paroxysmal atrial fibrillation (HCC)   Systemic hypertension   Dyslipidemia   Hypothyroidism   Major depression   Hereditary and idiopathic peripheral neuropathy   COPD (chronic obstructive pulmonary disease) (HCC)   Pre-diabetes   Acute renal failure superimposed on stage 3b chronic kidney disease (HCC)   Cardiac volume overload   Acute parotitis  Brief Summary 6Y female COPD Gold stage III, chronic A. fib chads score >4 on Pradaxa with permanent pacemaker placement 05/2011 for tachybradycardia syndrome, pain (sciatica) with opiate habituation, bipolar, HTN, hypothyroid, mitral insufficiency Admitted to [facility] on 11/29/19 with bilateral lower extremity and neck swelling after recent adjustment of diuretic/torsemide regimen by cardiology team now found to have AKI, BNP 900 in the setting of pneumonia and lower extremity cellulitis with sepsis and persistent hypotension hypoxia and hypoglycemia -Patient and family of transition to comfort care as of 12/01/2019   A/p 1)AKI----acute kidney injury on CKD stage -worsening renal function due to sepsis with persistent  hypotension, compounded by poor oral intake in the setting of left-sided parotitis, compounded by ACEI/loop diuretic use  --creatinine on admission=  , baseline creatinine =    , creatinine is now=  , renally adjust medications, avoid nephrotoxic agents / dehydration  / hypotension -History of biopsy-proven membranous nephropathy, stage III/IV, followed by Dr. Lowanda Foster, biopsy in 2015, albumin 1.7 -Patient was anuric yesterday, now very oliguric -Patient declines renal ultrasound, patient declines further IV fluids  2) left-sided parotitis--- fentanyl as needed, patient requested discontinuation of antibiotics, parotitis is making it difficult for patient to eat and drink  3)HFpEF--EF per echo from 11/29/2019 and 70 to 75%, patient with chronic diastolic dysfunction CHF, did not improve with torsemide PTA,  4) chronic atrial fibrillation--- patient requesting discontinuation of Pradaxa  5) sepsis secondary to bilateral pneumonia and lower extremity cellulitis----WBC is 20,000, patient was treated with vancomycin, Rocephin, Flagyl and Doxycycline-persistent hypotension -patient and family requested discontinuation of antibiotics and transition to comfort care  6)Social/Ethics-- Discussed with patient,  Conference at bedside with daughter Kelsey Sandoval), son Kelsey Sandoval)  , daughter in Sports coach , son in Sports coach and granddaughter Kelsey Sandoval  -Multiple/repeated conversations about plan of care, CODE STATUS, advanced directives and goals of care--other family members were on video chat as well -After multiple conversations/conferences at bedside -The Following decisions were made --- by patient and her family 1) pt is a DNR/DNI, declines Pressors for hypotension and possible septic shock, -As of 12/01/2019  patient refusing blood draws and routine medications, patient requesting discontinuation of antibiotic in order non-comfort related medications, -Patient and family have requested full comfort measures at this time -No ACLS  Rx , No Pressors, No Bipap, No Rapid Response ICU RN Lilia Pro Dishmon was present for the conversation  Disposition/Need for in-Hospital Stay- patient unable to be discharged at this time due to --- hemodynamically unstable, persistent hypotension, persistent hypoglycemia, poor oral intake, patient transitioning to full comfort care -Possible discharge home with home health/hospice over the next day or 2  Code Status : DNR/comfort care  Family Communication: (patient is alert, awake and coherent) -Family members at bedside as outlined above  Consults  : Nephrology  DVT Prophylaxis  : Comfort care  Lab Results  Component Value Date   PLT 196 12/01/2019    Inpatient Medications  Scheduled Meds: . Chlorhexidine Gluconate Cloth  6 each Topical Daily  . gabapentin  200 mg Oral BID  . mouth rinse  15 mL Mouth Rinse BID  . mometasone-formoterol  2 puff Inhalation BID  . OLANZapine zydis  5 mg Oral Daily  . sodium chloride flush  3 mL Intravenous Q12H   Continuous Infusions: . sodium chloride    . chlorproMAZINE (THORAZINE) IV    . dextrose 50 mL/hr at 12/01/19 0529   PRN Meds:.sodium chloride, acetaminophen **OR** acetaminophen, albuterol, alum & mag hydroxide-simeth, antiseptic oral rinse, bisacodyl, chlorproMAZINE (THORAZINE) IV, dextrose, diphenhydrAMINE, fentaNYL (SUBLIMAZE) injection, HYDROcodone-acetaminophen, lidocaine, ondansetron **OR** ondansetron (ZOFRAN) IV, phenol, polyvinyl alcohol, sodium chloride, sodium chloride flush   Anti-infectives (From admission, onward)   Start     Dose/Rate Route Frequency Ordered Stop   12/02/19 1000  doxycycline (VIBRA-TABS) tablet 100 mg  Status:  Discontinued     100 mg Oral Every 12 hours 12/01/19 0814 12/01/19 1744   12/01/19 1400  vancomycin (VANCOCIN) IVPB 1000 mg/200 mL premix  Status:  Discontinued     1,000 mg 200 mL/hr over 60 Minutes Intravenous Every 48 hours 11/29/19 1344 12/01/19 1744   12/01/19 0400  cefTRIAXone (ROCEPHIN)  2 g in sodium chloride 0.9 % 100 mL IVPB  Status:  Discontinued     2 g 200 mL/hr over 30 Minutes Intravenous Every 24 hours 11/30/19 1023 12/01/19 1744   11/30/19 0400  cefTRIAXone (ROCEPHIN) 1 g in sodium chloride 0.9 % 100 mL IVPB  Status:  Discontinued     1 g 200 mL/hr over 30 Minutes Intravenous Every 24 hours 11/29/19 1357 11/30/19 1023   11/29/19 1600  ceFEPIme (MAXIPIME) 2 g in sodium chloride 0.9 % 100 mL IVPB  Status:  Discontinued     2 g 200 mL/hr over 30 Minutes Intravenous Every 24 hours 11/29/19 1344 11/29/19 1357   11/29/19 1500  metroNIDAZOLE (FLAGYL) IVPB 500 mg  Status:  Discontinued     500 mg 100 mL/hr over 60 Minutes Intravenous Every 8 hours 11/29/19 1357 12/01/19 0814   11/29/19 1400  vancomycin (VANCOREADY) IVPB 1500 mg/300 mL     1,500 mg 150 mL/hr over 120 Minutes Intravenous  Once 11/29/19 1344 11/29/19 1841   11/29/19 1200  metroNIDAZOLE (FLAGYL) IVPB 500 mg  Status:  Discontinued     500 mg 100 mL/hr over 60 Minutes Intravenous Every 8 hours 11/29/19 0403 11/29/19 1315   11/29/19 0115  cefTRIAXone (ROCEPHIN) 1 g in sodium chloride 0.9 % 100 mL IVPB     1 g 200 mL/hr over 30 Minutes Intravenous  Once 11/29/19 0114 11/29/19 0522   11/29/19 0115  metroNIDAZOLE (FLAGYL) IVPB 500 mg     500 mg 100 mL/hr over 60 Minutes Intravenous  Once 11/29/19 0114 11/29/19 0445        Objective:   Vitals:   12/01/19 1200 12/01/19 1449 12/01/19 1644 12/01/19 1700  BP: (!) 99/49 114/89 (!) 112/58   Pulse:  60    Resp: 20 17 (!) 23   Temp: 97.7 F (36.5 C)     TempSrc: Axillary     SpO2:  (!) 77%  (!) 79%  Weight:      Height:        Wt Readings from Last 3 Encounters:  12/01/19 74.3 kg  11/21/19 68.9 kg  09/16/19 71.7 kg     Intake/Output Summary (Last 24 hours) at 12/01/2019 1803 Last data filed at 12/01/2019 0424 Gross per 24 hour  Intake 440 ml  Output --  Net 440 ml     Physical Exam  Gen:- Awake Alert,  In no apparent distress  HEENT:- [place].AT,  No sclera icterus Nose-4L/min Neck-Supple Neck,No JVD,.  Lungs-diminished in bases, no wheezing CV- S1, S2 normal, irregularly irregular Abd-  +ve B.Sounds, Abd Soft, No tenderness,    Extremity/Skin:-+ve   edema, pedal pulses present  Psych-affect is appropriate, oriented x3 Neuro-generalized weakness, no new focal deficits, no tremors   Data Review:   Micro Results Recent Results (from the past 240 hour(s))  Group A Strep by PCR     Status: None   Collection Time: 11/29/19 12:13 AM   Specimen: Throat; Sterile Swab  Result Value Ref Range Status   Group A Strep by PCR NOT DETECTED NOT DETECTED Final    Comment: Performed at [facility], [address] [place], [place] [address]  Blood culture (routine x 2)     Status: None (Preliminary result)   Collection Time: 11/29/19  1:42 AM   Specimen: BLOOD  Result Value Ref Range Status   Specimen Description BLOOD BLOOD LEFT WRIST  Final   Special Requests   Final    BOTTLES DRAWN AEROBIC AND ANAEROBIC Blood Culture adequate volume   Culture   Final    NO GROWTH 2 DAYS Performed at [facility], [address], [place], [place] [address]    Report Status PENDING  Incomplete  Blood culture (routine x 2)     Status: None (Preliminary result)   Collection Time: 11/29/19  1:53 AM   Specimen: BLOOD LEFT HAND  Result Value Ref Range Status   Specimen Description BLOOD LEFT HAND  Final   Special Requests   Final    BOTTLES DRAWN AEROBIC AND ANAEROBIC Blood Culture adequate volume   Culture   Final    NO GROWTH 2 DAYS Performed at [facility], [address], [place], [place] [address]    Report Status PENDING  Incomplete  MRSA PCR Screening     Status: Abnormal   Collection Time: 11/29/19  4:30 AM   Specimen: Nasopharyngeal  Result Value Ref Range Status   MRSA by PCR POSITIVE (A) NEGATIVE Final    Comment:        The GeneXpert MRSA Assay (FDA approved for NASAL specimens only), is one component of a comprehensive MRSA  colonization surveillance program. It is not intended to diagnose MRSA infection nor to guide or monitor treatment for MRSA infections. RESULT CALLED TO, READ BACK BY AND VERIFIED WITH: MCLAUGHLIN A. AT [id number] ON [id number] BY THOMPSON S. Performed at [facility], [address] [place], [place] [address]  Respiratory Panel by RT PCR (Flu A&B, Covid) - Nasopharyngeal Swab     Status: None   Collection Time: 11/30/19 10:10 AM   Specimen: Nasopharyngeal Swab  Result Value Ref Range Status   SARS Coronavirus 2 by RT PCR NEGATIVE NEGATIVE Final    Comment: (NOTE) SARS-CoV-2 target nucleic acids are NOT DETECTED. The SARS-CoV-2 RNA is generally detectable in upper respiratoy specimens during the acute phase of infection. The lowest concentration of SARS-CoV-2 viral copies this assay can detect is 131 copies/mL. A negative result does not preclude SARS-Cov-2 infection and should not be used as the sole basis for treatment or other patient management decisions. A negative result may occur with  improper specimen collection/handling, submission of specimen other than nasopharyngeal swab, presence of viral mutation(s) within the areas targeted by this assay, and inadequate number of viral copies (<131 copies/mL). A negative result must be combined with clinical observations, patient history, and epidemiological information. The expected result is Negative. Fact Sheet for Patients:  PinkCheek.be Fact Sheet for Healthcare Providers:  GravelBags.it This test is not yet ap proved or cleared by the [place] FDA and  has been authorized for detection and/or diagnosis of SARS-CoV-2 by FDA under an Emergency Use Authorization (EUA). This EUA will remain  in effect (meaning this test can be used) for the duration of the COVID-19 declaration under Section 564(b)(1) of the Act, 21 U.S.C. section 360bbb-3(b)(1), unless the authorization is  terminated or revoked sooner.    Influenza A by PCR NEGATIVE NEGATIVE Final   Influenza B by PCR NEGATIVE NEGATIVE Final    Comment: (NOTE) The Xpert Xpress SARS-CoV-2/FLU/RSV assay is intended as an aid in  the diagnosis of influenza from Nasopharyngeal swab specimens and  should not be used as a sole basis for treatment. Nasal washings and  aspirates are unacceptable for Xpert Xpress SARS-CoV-2/FLU/RSV  testing. Fact Sheet for Patients: PinkCheek.be Fact Sheet for Healthcare Providers: GravelBags.it This test is not yet approved or cleared by the [place] FDA and  has been authorized for detection and/or diagnosis of SARS-CoV-2 by  FDA under an Emergency Use Authorization (EUA). This EUA will remain  in effect (meaning this test can be used) for the duration of the  Covid-19 declaration under Section 564(b)(1) of the Act, 21  U.S.C. section 360bbb-3(b)(1), unless the authorization is  terminated or revoked. Performed at [facility], [address] [place], [place] [address]     Radiology Reports CT Soft Tissue Neck Wo Contrast  Result Date: 11/29/2019 CLINICAL DATA:  Initial evaluation for non pulsatile neck mass. EXAM: CT NECK WITHOUT CONTRAST TECHNIQUE: Multidetector CT imaging of the neck was performed following the standard protocol without intravenous contrast. COMPARISON:  None. FINDINGS: Pharynx and larynx: Examination technically limited by motion artifact and lack of IV contrast. Oral cavity within normal limits. Patient is largely edentulous. Asymmetric prominence of the left palatine tonsil as compared to the right, likely reactive. Associated inflammatory stranding and swelling within the adjacent left parapharyngeal space related to the inflammatory process within the left face/neck. Asymmetric fullness and edema extends inferiorly along the left pharynx as well. Nasopharynx within normal limits. Mild  retropharyngeal edema/effusion, greater on the left. No definite discrete retropharyngeal collection. Epiglottis itself grossly within normal limits. 7 mm hyperdensity seen at the level of the left arytenoid cartilage (series 2, image 70), indeterminate, and could be related to prior intervention. No discernible mass identified. True cords symmetric and within normal limits. Subglottic airway clear. Salivary glands: Asymmetric  enlargement with inflammatory stranding seen involving the left parotid gland, suggesting acute parotitis. No obstructive King seen within Stensen's duct. Associated inflammatory stranding seen throughout the adjacent left parotid space, extending inferiorly along the left neck, and medially into the left parapharyngeal space. Inflammatory changes surround the left submandibular gland as well related to the inflammatory process within the left neck. No appreciable abscess or drainable fluid collection seen on this noncontrast examination. Right-sided salivary glands within normal limits. Thyroid: Thyroid grossly within normal limits. Lymph nodes: Mild asymmetric prominence of subcentimeter left-sided cervical lymph nodes, likely reactive. No pathologically enlarged lymph nodes identified. Vascular: Moderate atherosclerotic change seen about the aortic arch and carotid bifurcations. Calcified atherosclerosis present at the skull base as well. Limited intracranial: Age-related cerebral and cerebellar atrophy. Otherwise unremarkable. Visualized orbits: Visualized globes and orbital soft tissues within normal limits. Mastoids and visualized paranasal sinuses: Visualized paranasal sinuses are clear. Mastoid air cells and middle ear cavities are well pneumatized and free of fluid. Skeleton: No definite osseous abnormality seen on this motion degraded exam. No discrete lytic or blastic osseous lesions. Upper chest: Large layering bilateral pleural effusions partially visualized. Right-sided  pacemaker/AICD partially visualized as well. Other: None. IMPRESSION: 1. Findings consistent with acute left parotitis. Associated swelling with inflammatory stranding throughout the adjacent left neck compatible with associated regional cellulitis. No obstructive Nash or drainable fluid collection identified. 2. Mucosal edema and swelling involving the left pharynx related to the inflammatory process within the adjacent left neck. 3. Large layering bilateral pleural effusions, partially visualized. 4. 7 mm hyperdensity and/or sclerosis at the level of the left arytenoid cartilage, indeterminate, and could be related to prior intervention. Correlation with history recommended. If no previous intervention has been performed, nonemergent outpatient ENT referral for direct visualization suggested for further evaluation. No discrete mass identified on this noncontrast motion degraded exam. 5.  Aortic Atherosclerosis (ICD10-I70.0). Electronically Signed   By: Jeannine Boga M.D.   On: 11/29/2019 00:50   DG CHEST PORT 1 VIEW  Result Date: 11/30/2019 CLINICAL DATA:  Increased shortness of breath EXAM: PORTABLE CHEST 1 VIEW COMPARISON:  11/23/2019 FINDINGS: Right-sided implanted cardiac device, unchanged. Stable mild cardiomegaly. Calcific aortic knob. Worsening bilateral pleural effusions with associated bibasilar airspace opacities. No pneumothorax. IMPRESSION: Worsening bilateral pleural effusions with associated bibasilar airspace opacities. Electronically Signed   By: Davina Poke D.O.   On: 11/30/2019 10:41   DG Chest Portable 1 View  Result Date: 11/18/2019 CLINICAL DATA:  Shortness of breath EXAM: PORTABLE CHEST 1 VIEW COMPARISON:  September 27, 2019 FINDINGS: The heart size and mediastinal contours are unchanged with mild cardiomegaly. Aortic knob calcifications. A right-sided pacemaker is again identified. There are small bilateral pleural effusions. Hazy airspace opacity seen at the right lung  base which could be layering effusion or atelectasis. There is streaky atelectasis seen in the right mid lung. No acute osseous abnormality. IMPRESSION: Small bilateral pleural effusions. Hazy airspace opacity at the right lung base which could be due to layering effusion or atelectasis. Electronically Signed   By: Prudencio Pair M.D.   On: 11/27/2019 22:25   ECHOCARDIOGRAM COMPLETE  Result Date: 11/29/2019    ECHOCARDIOGRAM REPORT   Patient Name:   GERMANY DODGEN Date of Exam: 11/29/2019 Medical Rec #:  [id number]     Height:       69.0 in Accession #:    [id number]    Weight:       165.0 lb Date of Birth:  1933-07-28  BSA:          1.904 m Patient Age:    47 years      BP:           138/103 mmHg Patient Gender: F             HR:           87 bpm. Exam Location:  [facility] Procedure: 2D Echo Indications:    Congestive Heart Failure 428.0 / I50.9  History:        Patient has prior history of Echocardiogram examinations, most                 recent 10/31/2018. Pacemaker, COPD, Arrythmias:Atrial                 Fibrillation; Risk Factors:Hypertension, Dyslipidemia and                 Non-Smoker. Sick sinus syndrome.  Sonographer:    [name] RDCS (AE) Referring Phys: [id number] DAVID MANUEL [place]  1. Left ventricular ejection fraction, by estimation, is 70 to 75%. The left ventricle has hyperdynamic function. The left ventricle has no regional wall motion abnormalities. There is mild concentric left ventricular hypertrophy. Left ventricular diastolic parameters are indeterminate.  2. Right ventricular systolic function is normal. The right ventricular size is mildly enlarged. There is moderately elevated pulmonary artery systolic pressure.  3. Left atrial size was severely dilated.  4. Right atrial size was moderately dilated.  5. The mitral valve is degenerative. Mild mitral valve regurgitation.  6. Tricuspid valve regurgitation is mild to moderate.  7. The aortic valve was not well visualized.  Aortic valve regurgitation is not visualized.  8. The inferior vena cava is normal in size with greater than 50% respiratory variability, suggesting right atrial pressure of 3 mmHg. FINDINGS  Left Ventricle: Left ventricular ejection fraction, by estimation, is 70 to 75%. The left ventricle has hyperdynamic function. The left ventricle has no regional wall motion abnormalities. The left ventricular internal cavity size was normal in size. There is mild concentric left ventricular hypertrophy. Left ventricular diastolic parameters are indeterminate. Indeterminate filling pressures. Right Ventricle: The right ventricular size is mildly enlarged. No increase in right ventricular wall thickness. Right ventricular systolic function is normal. There is moderately elevated pulmonary artery systolic pressure. The tricuspid regurgitant velocity is 2.99 m/s, and with an assumed right atrial pressure of 10 mmHg, the estimated right ventricular systolic pressure is 18.8 mmHg. Left Atrium: Left atrial size was severely dilated. Right Atrium: Right atrial size was moderately dilated. Pericardium: There is no evidence of pericardial effusion. Mitral Valve: The mitral valve is degenerative in appearance. There is mild thickening of the mitral valve leaflet(s). Moderate mitral annular calcification. Mild mitral valve regurgitation. Tricuspid Valve: The tricuspid valve is grossly normal. Tricuspid valve regurgitation is mild to moderate. Aortic Valve: The aortic valve was not well visualized. Aortic valve regurgitation is not visualized. Pulmonic Valve: The pulmonic valve was not well visualized. Pulmonic valve regurgitation is not visualized. Aorta: The aortic root is normal in size and structure. Venous: The inferior vena cava is normal in size with greater than 50% respiratory variability, suggesting right atrial pressure of 3 mmHg. IAS/Shunts: No atrial level shunt detected by color flow Doppler. Additional Comments: A pacer wire  is visualized.  LEFT VENTRICLE PLAX 2D LVIDd:         3.21 cm  Diastology LVIDs:         1.81  cm  LV e' lateral:   6.53 cm/s LV PW:         1.20 cm  LV E/e' lateral: 13.8 LV IVS:        1.16 cm  LV e' medial:    7.07 cm/s LVOT diam:     2.00 cm  LV E/e' medial:  12.7 LVOT Area:     3.14 cm  RIGHT VENTRICLE RV S prime:     14.00 cm/s LEFT ATRIUM           Index       RIGHT ATRIUM           Index LA diam:      4.20 cm 2.21 cm/m  RA Area:     20.40 cm LA Vol (A2C): 64.1 ml 33.67 ml/m RA Volume:   59.10 ml  31.04 ml/m LA Vol (A4C): 30.0 ml 15.76 ml/m   AORTA Ao Root diam: 2.40 cm MITRAL VALVE               TRICUSPID VALVE MV Area (PHT): 2.39 cm    TR Peak grad:   35.8 mmHg MV Decel Time: 317 msec    TR Vmax:        299.00 cm/s MV E velocity: 90.00 cm/s MV A velocity: 34.30 cm/s  SHUNTS MV E/A ratio:  2.62        Systemic Diam: 2.00 cm Kate Sable MD Electronically signed by Kate Sable MD Signature Date/Time: 11/29/2019/10:47:50 AM    Final      CBC Recent Labs  Lab 11/20/2019 2225 11/29/19 0830 11/30/19 0600 12/01/19 0348  WBC 15.9* 24.4* 22.9* 20.0*  HGB 13.6 12.4 12.5 11.9*  HCT 43.3 39.2 39.8 39.1  PLT 237 226 227 196  MCV 100.0 100.0 100.0 102.9*  MCH 31.4 31.6 31.4 31.3  MCHC 31.4 31.6 31.4 30.4  RDW 14.4 14.4 14.9 15.3  LYMPHSABS  --  1.0 1.3 1.5  MONOABS  --  0.9 0.8 0.9  EOSABS  --  0.0 0.0 0.0  BASOSABS  --  0.0 0.0 0.0    Chemistries  Recent Labs  Lab 11/21/2019 2225 11/29/19 0830 11/30/19 0600 12/01/19 0616  NA 136 136 137 137  K 3.9 4.2 4.2 3.8  CL 99 98 100 102  CO2 24 25 23 22   GLUCOSE 82 94 110* 92  BUN 53* 56* 59* 63*  CREATININE 2.68* 2.83* 3.17* 3.50*  CALCIUM 7.8* 7.7* 7.8* 7.6*  AST 35  --  30 28  ALT 23  --  22 20  ALKPHOS 133*  --  102 96  BILITOT 0.7  --  0.6 0.7   ------------------------------------------------------------------------------------------------------------------ No results for input(s): CHOL, HDL, LDLCALC, TRIG, CHOLHDL,  LDLDIRECT in the last 72 hours.  Lab Results  Component Value Date   HGBA1C 5.0 11/29/2019   ------------------------------------------------------------------------------------------------------------------ No results for input(s): TSH, T4TOTAL, T3FREE, THYROIDAB in the last 72 hours.  Invalid input(s): FREET3 ------------------------------------------------------------------------------------------------------------------ No results for input(s): VITAMINB12, FOLATE, FERRITIN, TIBC, IRON, RETICCTPCT in the last 72 hours.  Coagulation profile No results for input(s): INR, PROTIME in the last 168 hours.  No results for input(s): DDIMER in the last 72 hours.  Cardiac Enzymes No results for input(s): CKMB, TROPONINI, MYOGLOBIN in the last 168 hours.  Invalid input(s): CK ------------------------------------------------------------------------------------------------------------------    Component Value Date/Time   BNP 908.0 (H) 11/27/2019 2225   BNP 218.0 (H) 02/27/2014 1008     Roxan Hockey M.D on 12/01/2019 at 6:03 PM  Go to [email or web address] - for contact info  [facility] - Office  [phone]

## 2019-12-01 NOTE — Progress Notes (Signed)
Patient's CBG resulted 26. Patient refused D50 ampule, patient educated on the reasoning for D50 and continued accuchecks. Patient expresses the desire to stop accuchecks to both this RN as well as the patient's daughter who remains at bedside. Further discussed patient's wishes with the patient and family. Patient wishes to stop all by mouth medications, IV antibiotics, IV fluids, accuchecks and lab draws at this time. Patient's daughter continues to express the desire to respect the patient's wishes. MD made aware.  Celestia Khat, RN

## 2019-12-01 NOTE — Progress Notes (Signed)
Hypoglycemic Event  CBG: 19  Treatment: dextrose 50%   Symptoms: tremors  Follow-up CBG: Time: CBG Result:  Possible Reasons for Event:   Comments/MD notified: Dr. Scherrie November paged and made aware of pts blood sugar. Waiting for call back/orders   Tim Lair

## 2019-12-01 NOTE — Progress Notes (Signed)
Admit: 12/02/2019 LOS: 2  43F AoCKD3 acute parotitis with poor oral intake, ongoing use of ACE inhibitor/loop diuretic, potential cellulitis on broad-spectrum antibiotics; significant comorbidities including history of membranous nephropathy  Subjective:  . Transferred to ICU for hypotension, hypoxia . Goals of care explored with family throughout the day yesterday, is currently DNR/DNI, limited scope of care . Discussed this with the family at the bedside, daughter and son-in-law . Creatinine has worsened to 3.5, minimal urine output, appears to be an uric, K and HCO3 are stable . She is on 4 L nasal cannula, blood pressures are somewhat improved but labile, afebrile . UA yesterday with 4+ proteinuria and quantified UPC is 4.3; pyuria present, no hematuria . Renal ultrasound not yet completed  02/25 0701 - 02/26 0700 In: 539.9 [P.O.:240; IV Piggyback:299.9] Out: -   Filed Weights   11/24/2019 2141 12/01/19 0445  Weight: 74.8 kg 74.3 kg    Scheduled Meds: . Chlorhexidine Gluconate Cloth  6 each Topical Daily  . dabigatran  75 mg Oral Q12H  . [START ON 12/02/2019] doxycycline  100 mg Oral Q12H  . gabapentin  200 mg Oral BID  . levothyroxine  75 mcg Oral Q0600  . mouth rinse  15 mL Mouth Rinse BID  . mometasone-formoterol  2 puff Inhalation BID  . pantoprazole  40 mg Oral Daily  . sodium chloride flush  3 mL Intravenous Q12H   Continuous Infusions: . sodium chloride    . cefTRIAXone (ROCEPHIN)  IV 2 g (12/01/19 0349)  . dextrose 50 mL/hr at 12/01/19 0529  . vancomycin     PRN Meds:.sodium chloride, acetaminophen **OR** acetaminophen, albuterol, alum & mag hydroxide-simeth, dextrose, fentaNYL (SUBLIMAZE) injection, HYDROcodone-acetaminophen, ondansetron **OR** ondansetron (ZOFRAN) IV, phenol, sodium chloride flush  Current Labs: reviewed    Physical Exam:  Blood pressure 103/72, pulse (!) 101, temperature 97.7 F (36.5 C), temperature source Oral, resp. rate 14, height 5\' 9"   (1.753 m), weight 74.3 kg, SpO2 99 %. GEN:  Chronically ill-appearing, awake and conversant ENT: L mandibular swelling/tenderness EYES: EOMI CV: irregular, normal rate PULM: diminished throughout esp in bases ABD: s/nt, nd SKIN:  1-2+ LEE, significant hyperpigmentation, some superficial ulcers, no intense erythema; bandaged EXT: As above  A 1. AoCKD3:  Anuric.  Baseline creatinine around 1.5.  Suspect cause is ATN related to hypovolemia driven by #2, hypotension, ongoing use of ACE inhibitor/diuretic. 2. Acute parotitis, per TRH, ceftriaxone, vancomycin, doxycycline 3. Potential cellulitis of lower extremities, antibiotics as above; blood cultures no growth to date 4. History of biopsy-proven membranous nephropathy, stage III/IV, followed by Dr. Lowanda Foster, biopsy in 2015, albumin 1.7; UPC during this admission 4.3 5. Diastolic heart failure, TTE yesterday LVEF 70 to 75% 6. Atrial fibrillation on DOAC, rate control 7. Bipolar disorder 8. Hypertension, not currently present 9. COPD 10. Severe protein calorie malnutrition with albumin of 1.5 1. Leukocytosis  P . Discussed with family and patient that given her age, comorbidities, and current status that she would receive very little benefit from acute or long-term dialysis; and that though there is no indication at the current time to initiate dialysis, if an indication did develop she should not receive it; the patient and family are in agreement that she is not a dialysis candidate. . The family still wishes to treat her infection and other reversible issues, and see if she can turn the corner in the next 24 to 48 hours, I think this is very reasonable . At this time continue supportive care, we  will see if GFR begins to recover, but her anuric states and worsening creatinine are very ominous . Though she has nephrotic range proteinuria, I do not think that she has active membranous disease at the current time, even so, no therapies will be  offered for this currently . We will follow over the weekend . Medication Issues; o Preferred narcotic agents for pain control are hydromorphone, fentanyl, and methadone. Morphine should not be used.  o Baclofen should be avoided o Avoid oral sodium phosphate and magnesium citrate based laxatives / bowel preps    Pearson Grippe MD 12/01/2019, 9:48 AM  Recent Labs  Lab 11/29/19 0830 11/30/19 0600 12/01/19 0616  NA 136 137 137  K 4.2 4.2 3.8  CL 98 100 102  CO2 25 23 22   GLUCOSE 94 110* 92  BUN 56* 59* 63*  CREATININE 2.83* 3.17* 3.50*  CALCIUM 7.7* 7.8* 7.6*   Recent Labs  Lab 11/29/19 0830 11/30/19 0600 12/01/19 0348  WBC 24.4* 22.9* 20.0*  NEUTROABS 22.2* 20.6* 17.5*  HGB 12.4 12.5 11.9*  HCT 39.2 39.8 39.1  MCV 100.0 100.0 102.9*  PLT 226 227 196

## 2019-12-01 NOTE — Progress Notes (Signed)
Patient declines turning and repositioning every two hours stating that it is painful to move for her despite medication.

## 2019-12-01 NOTE — Progress Notes (Signed)
Dr. Scherrie November paged and made aware of pts BP of 74/64 (20), MD made aware that patient is DNR but we are still treating. Waiting for orders/call back.  D10 to be started @ 64ml/hr d/t low blood sugars.

## 2019-12-01 NOTE — Progress Notes (Signed)
Patient continues to refuse accuchecks at this time.  Celestia Khat, RN

## 2019-12-02 ENCOUNTER — Encounter: Payer: Self-pay | Admitting: Family Medicine

## 2019-12-02 MED ORDER — MORPHINE 100MG IN NS 100ML (1MG/ML) PREMIX INFUSION
5.0000 mg/h | INTRAVENOUS | Status: DC
  Administered 2019-12-03 (×2): 5 mg/h via INTRAVENOUS
  Filled 2019-12-02 (×2): qty 100

## 2019-12-02 MED ORDER — MORPHINE 100MG IN NS 100ML (1MG/ML) PREMIX INFUSION
INTRAVENOUS | Status: AC
  Filled 2019-12-02: qty 100

## 2019-12-02 NOTE — Progress Notes (Signed)
Pt refused accu checks, refused to be turned/repositioned, and refused to have blood pressures checked stating it hurts and no need. She received two PRN doses of fentanyl during the shift. Daughter is at bedside.

## 2019-12-02 NOTE — Plan of Care (Signed)
Reviewed primary team's note.  Patient wishing to be comfort care only.   Will sign off.  Please do not hesitate to contact me if I can be of any assistance with her care.   Claudia Desanctis  12/02/2019 3:20 PM

## 2019-12-02 NOTE — Progress Notes (Signed)
Patient expressed concern for spiritual support and prayer. Patient daughter, son, nieces were present during visit. Listened supportively, validated patient's feelings regarding death and dying. Did not want upset family but she was ready to see her Jesus. Helped family identify coping strategies and learned how o say say good bye. Prayer was offered for strength for God's will per family request.

## 2019-12-02 NOTE — Progress Notes (Signed)
Once again nursing lost dulera inhaler in move from ICU to 328  on 3rd floor. New inhaler has been obtained from [facility]. Patients saturation are unreliable oxygen has been increased to 4 liters.

## 2019-12-02 NOTE — Progress Notes (Signed)
Patient Demographics:    Kelsey Sandoval, is a 86 y.o. female, DOB - 04/01/1933, NLG:[id number]  Admit date - 11/18/2019   Admitting Physician Reubin Milan, MD  Outpatient Primary MD for the patient is Kathyrn Drown, MD  LOS - 3  Chief Complaint  Patient presents with  . Leg Swelling        Subjective:    [name] today has no fevers, no emesis,  No chest pain,  -  -Patient daughter [name] at bedside -Patient continues to refuse care, she just wants to be left alone uncomfortable  -Daughter asked patient again this morning if she wants blood draws/lab work, patient repeatedly said she wants to be comfort care only , she does not want further investigations or aggressive treatment--- patient daughter request that we respect patient's wishes   Assessment  & Plan :    Principal Problem:   Cellulitis of multiple sites Active Problems:   Paroxysmal atrial fibrillation (HCC)   Systemic hypertension   Dyslipidemia   Hypothyroidism   Major depression   Hereditary and idiopathic peripheral neuropathy   COPD (chronic obstructive pulmonary disease) ([place])   Pre-diabetes   Acute renal failure superimposed on stage 3b chronic kidney disease ([place])   Cardiac volume overload   Acute parotitis  Brief Summary 58Y female COPD Gold stage III, chronic A. fib chads score >4 on Pradaxa with permanent pacemaker placement 05/2011 for tachybradycardia syndrome, pain (sciatica) with opiate habituation, bipolar, HTN, hypothyroid, mitral insufficiency Admitted to [facility] on 11/29/19 with bilateral lower extremity and neck swelling after recent adjustment of diuretic/torsemide regimen by cardiology team now found to have AKI, BNP 900 in the setting of pneumonia and lower extremity cellulitis with sepsis and persistent hypotension hypoxia and hypoglycemia -Patient and family requested transition to  comfort care as of 12/01/2019   A/p 1)AKI----acute kidney injury on CKD stage -worsening renal function due to sepsis with persistent hypotension, compounded by poor oral intake in the setting of left-sided parotitis, compounded by ACEI/loop diuretic use  --creatinine on admission=  , baseline creatinine =    , creatinine is now=  , renally adjust medications, avoid nephrotoxic agents / dehydration  / hypotension -History of biopsy-proven membranous nephropathy, stage III/IV, followed by Dr. Lowanda Foster, biopsy in 2015, albumin 1.7 -Patient was anuric yesterday, now very oliguric -Patient declines renal ultrasound, patient declines further IV fluids -Patient repeatedly reiterated that she wants to  remain comfort care only  2) left-sided parotitis--- fentanyl as needed, patient requested discontinuation of antibiotics, parotitis is making it difficult for patient to eat and drink  3)HFpEF--EF per echo from 11/29/2019 and 70 to 75%, patient with chronic diastolic dysfunction CHF, did not improve with torsemide PTA,\ --Patient repeatedly reiterated that she wants to  remain comfort care only  4) chronic atrial fibrillation--- patient requesting discontinuation of Pradaxa  5) sepsis secondary to bilateral pneumonia and lower extremity cellulitis----WBC is 20,000, patient was treated with vancomycin, Rocephin, Flagyl and Doxycycline-persistent hypotension -patient and family requested discontinuation of antibiotics and transition to comfort care  6)Social/Ethics-- Discussed with patient,  Conference at bedside with daughter [name]), son [name])  , daughter in Sports coach , son in Sports coach and granddaughter [name]  -Multiple/repeated conversations about plan of care,  CODE STATUS, advanced directives and goals of care--other family members were on video chat as well -After multiple conversations/conferences at bedside -The Following decisions were made --- by patient and her family 1) pt is a DNR/DNI, declines  Pressors for hypotension and possible septic shock, -As of 12/01/2019 patient refusing blood draws and routine medications, patient requesting discontinuation of antibiotic in order non-comfort related medications, -Patient and family have requested full comfort measures at this time -No ACLS Rx , No Pressors, No Bipap, No Rapid Response ICU RN Lilia Pro Dishmon was present for the conversation --Patient repeatedly reiterated that she wants to  remain comfort care only  Disposition/Need for in-Hospital Stay- patient unable to be discharged at this time due to --- hemodynamically unstable, persistent hypotension, persistent hypoglycemia, poor oral intake, patient transitioning to full comfort care -Possible discharge home with home health/hospice over the next day or 2 --Patient repeatedly reiterated that she wants to  remain comfort care only  Code Status : DNR/comfort care  Family Communication: (patient is alert, awake and coherent) -Family members at bedside as outlined above  Consults  : Nephrology  DVT Prophylaxis  : Comfort care  Lab Results  Component Value Date   PLT 196 12/01/2019    Inpatient Medications  Scheduled Meds: . Chlorhexidine Gluconate Cloth  6 each Topical Daily  . gabapentin  200 mg Oral BID  . mouth rinse  15 mL Mouth Rinse BID  . mometasone-formoterol  2 puff Inhalation BID  . OLANZapine zydis  5 mg Oral Daily  . sodium chloride flush  3 mL Intravenous Q12H   Continuous Infusions: . sodium chloride    . chlorproMAZINE (THORAZINE) IV    . dextrose 50 mL/hr at 12/01/19 0529   PRN Meds:.sodium chloride, acetaminophen **OR** acetaminophen, albuterol, alum & mag hydroxide-simeth, antiseptic oral rinse, bisacodyl, chlorproMAZINE (THORAZINE) IV, dextrose, diphenhydrAMINE, fentaNYL (SUBLIMAZE) injection, HYDROcodone-acetaminophen, lidocaine, ondansetron **OR** ondansetron (ZOFRAN) IV, phenol, polyvinyl alcohol, sodium chloride, sodium chloride  flush   Anti-infectives (From admission, onward)   Start     Dose/Rate Route Frequency Ordered Stop   12/02/19 1000  doxycycline (VIBRA-TABS) tablet 100 mg  Status:  Discontinued     100 mg Oral Every 12 hours 12/01/19 0814 12/01/19 1744   12/01/19 1400  vancomycin (VANCOCIN) IVPB 1000 mg/200 mL premix  Status:  Discontinued     1,000 mg 200 mL/hr over 60 Minutes Intravenous Every 48 hours 11/29/19 1344 12/01/19 1744   12/01/19 0400  cefTRIAXone (ROCEPHIN) 2 g in sodium chloride 0.9 % 100 mL IVPB  Status:  Discontinued     2 g 200 mL/hr over 30 Minutes Intravenous Every 24 hours 11/30/19 1023 12/01/19 1744   11/30/19 0400  cefTRIAXone (ROCEPHIN) 1 g in sodium chloride 0.9 % 100 mL IVPB  Status:  Discontinued     1 g 200 mL/hr over 30 Minutes Intravenous Every 24 hours 11/29/19 1357 11/30/19 1023   11/29/19 1600  ceFEPIme (MAXIPIME) 2 g in sodium chloride 0.9 % 100 mL IVPB  Status:  Discontinued     2 g 200 mL/hr over 30 Minutes Intravenous Every 24 hours 11/29/19 1344 11/29/19 1357   11/29/19 1500  metroNIDAZOLE (FLAGYL) IVPB 500 mg  Status:  Discontinued     500 mg 100 mL/hr over 60 Minutes Intravenous Every 8 hours 11/29/19 1357 12/01/19 0814   11/29/19 1400  vancomycin (VANCOREADY) IVPB 1500 mg/300 mL     1,500 mg 150 mL/hr over 120 Minutes Intravenous  Once 11/29/19 1344 11/29/19 1841  11/29/19 1200  metroNIDAZOLE (FLAGYL) IVPB 500 mg  Status:  Discontinued     500 mg 100 mL/hr over 60 Minutes Intravenous Every 8 hours 11/29/19 0403 11/29/19 1315   11/29/19 0115  cefTRIAXone (ROCEPHIN) 1 g in sodium chloride 0.9 % 100 mL IVPB     1 g 200 mL/hr over 30 Minutes Intravenous  Once 11/29/19 0114 11/29/19 0522   11/29/19 0115  metroNIDAZOLE (FLAGYL) IVPB 500 mg     500 mg 100 mL/hr over 60 Minutes Intravenous  Once 11/29/19 0114 11/29/19 0445        Objective:   Vitals:   12/02/19 0200 12/02/19 0300 12/02/19 0400 12/02/19 0500  BP:      Pulse: (!) 102 (!) 114 (!) 105 (!) 110   Resp: 19 (!) 30 (!) 23 (!) 35  Temp:      TempSrc:      SpO2: 95% 99% 100% 100%  Weight:      Height:        Wt Readings from Last 3 Encounters:  12/01/19 74.3 kg  11/21/19 68.9 kg  09/16/19 71.7 kg    Intake/Output Summary (Last 24 hours) at 12/02/2019 0953 Last data filed at 12/02/2019 7412 Gross per 24 hour  Intake --  Output 550 ml  Net -550 ml   Physical Exam  Gen:- Awake Alert,  In no apparent distress  HEENT:- [place].AT, No sclera icterus Nose-4L/min Neck-Supple Neck,No JVD,.  Lungs-diminished in bases, no wheezing CV- S1, S2 normal, irregularly irregular Abd-  +ve B.Sounds, Abd Soft, No tenderness,    Extremity/Skin:-+ve   edema, pedal pulses present  Psych-affect is appropriate, oriented x3 Neuro-generalized weakness, no new focal deficits, no tremors   Data Review:   Micro Results Recent Results (from the past 240 hour(s))  Group A Strep by PCR     Status: None   Collection Time: 11/29/19 12:13 AM   Specimen: Throat; Sterile Swab  Result Value Ref Range Status   Group A Strep by PCR NOT DETECTED NOT DETECTED Final    Comment: Performed at [facility], [address], [place], [place] [address]  Blood culture (routine x 2)     Status: None (Preliminary result)   Collection Time: 11/29/19  1:42 AM   Specimen: BLOOD  Result Value Ref Range Status   Specimen Description BLOOD BLOOD LEFT WRIST  Final   Special Requests   Final    BOTTLES DRAWN AEROBIC AND ANAEROBIC Blood Culture adequate volume   Culture   Final    NO GROWTH 3 DAYS Performed at [facility], [address] [place], [place] [address]    Report Status PENDING  Incomplete  Blood culture (routine x 2)     Status: None (Preliminary result)   Collection Time: 11/29/19  1:53 AM   Specimen: BLOOD LEFT HAND  Result Value Ref Range Status   Specimen Description BLOOD LEFT HAND  Final   Special Requests   Final    BOTTLES DRAWN AEROBIC AND ANAEROBIC Blood Culture adequate volume   Culture   Final     NO GROWTH 3 DAYS Performed at [facility], [address], [place], [place] [address]    Report Status PENDING  Incomplete  MRSA PCR Screening     Status: Abnormal   Collection Time: 11/29/19  4:30 AM   Specimen: Nasopharyngeal  Result Value Ref Range Status   MRSA by PCR POSITIVE (A) NEGATIVE Final    Comment:  The GeneXpert MRSA Assay (FDA approved for NASAL specimens only), is one component of a comprehensive MRSA colonization surveillance program. It is not intended to diagnose MRSA infection nor to guide or monitor treatment for MRSA infections. RESULT CALLED TO, READ BACK BY AND VERIFIED WITH: MCLAUGHLIN A. AT [id number] ON [id number] BY THOMPSON S. Performed at [facility], [address], [place], [place] [address]   Respiratory Panel by RT PCR (Flu A&B, Covid) - Nasopharyngeal Swab     Status: None   Collection Time: 11/30/19 10:10 AM   Specimen: Nasopharyngeal Swab  Result Value Ref Range Status   SARS Coronavirus 2 by RT PCR NEGATIVE NEGATIVE Final    Comment: (NOTE) SARS-CoV-2 target nucleic acids are NOT DETECTED. The SARS-CoV-2 RNA is generally detectable in upper respiratoy specimens during the acute phase of infection. The lowest concentration of SARS-CoV-2 viral copies this assay can detect is 131 copies/mL. A negative result does not preclude SARS-Cov-2 infection and should not be used as the sole basis for treatment or other patient management decisions. A negative result may occur with  improper specimen collection/handling, submission of specimen other than nasopharyngeal swab, presence of viral mutation(s) within the areas targeted by this assay, and inadequate number of viral copies (<131 copies/mL). A negative result must be combined with clinical observations, patient history, and epidemiological information. The expected result is Negative. Fact Sheet for Patients:  PinkCheek.be Fact Sheet for Healthcare Providers:   GravelBags.it This test is not yet ap proved or cleared by the [place] FDA and  has been authorized for detection and/or diagnosis of SARS-CoV-2 by FDA under an Emergency Use Authorization (EUA). This EUA will remain  in effect (meaning this test can be used) for the duration of the COVID-19 declaration under Section 564(b)(1) of the Act, 21 U.S.C. section 360bbb-3(b)(1), unless the authorization is terminated or revoked sooner.    Influenza A by PCR NEGATIVE NEGATIVE Final   Influenza B by PCR NEGATIVE NEGATIVE Final    Comment: (NOTE) The Xpert Xpress SARS-CoV-2/FLU/RSV assay is intended as an aid in  the diagnosis of influenza from Nasopharyngeal swab specimens and  should not be used as a sole basis for treatment. Nasal washings and  aspirates are unacceptable for Xpert Xpress SARS-CoV-2/FLU/RSV  testing. Fact Sheet for Patients: PinkCheek.be Fact Sheet for Healthcare Providers: GravelBags.it This test is not yet approved or cleared by the [place] FDA and  has been authorized for detection and/or diagnosis of SARS-CoV-2 by  FDA under an Emergency Use Authorization (EUA). This EUA will remain  in effect (meaning this test can be used) for the duration of the  Covid-19 declaration under Section 564(b)(1) of the Act, 21  U.S.C. section 360bbb-3(b)(1), unless the authorization is  terminated or revoked. Performed at [facility], [address] [place], [place] [address]     Radiology Reports CT Soft Tissue Neck Wo Contrast  Result Date: 11/29/2019 CLINICAL DATA:  Initial evaluation for non pulsatile neck mass. EXAM: CT NECK WITHOUT CONTRAST TECHNIQUE: Multidetector CT imaging of the neck was performed following the standard protocol without intravenous contrast. COMPARISON:  None. FINDINGS: Pharynx and larynx: Examination technically limited by motion artifact and lack of IV  contrast. Oral cavity within normal limits. Patient is largely edentulous. Asymmetric prominence of the left palatine tonsil as compared to the right, likely reactive. Associated inflammatory stranding and swelling within the adjacent left parapharyngeal space related to the inflammatory process within the left face/neck. Asymmetric fullness and edema extends inferiorly along  the left pharynx as well. Nasopharynx within normal limits. Mild retropharyngeal edema/effusion, greater on the left. No definite discrete retropharyngeal collection. Epiglottis itself grossly within normal limits. 7 mm hyperdensity seen at the level of the left arytenoid cartilage (series 2, image 70), indeterminate, and could be related to prior intervention. No discernible mass identified. True cords symmetric and within normal limits. Subglottic airway clear. Salivary glands: Asymmetric enlargement with inflammatory stranding seen involving the left parotid gland, suggesting acute parotitis. No obstructive Pettry seen within Stensen's duct. Associated inflammatory stranding seen throughout the adjacent left parotid space, extending inferiorly along the left neck, and medially into the left parapharyngeal space. Inflammatory changes surround the left submandibular gland as well related to the inflammatory process within the left neck. No appreciable abscess or drainable fluid collection seen on this noncontrast examination. Right-sided salivary glands within normal limits. Thyroid: Thyroid grossly within normal limits. Lymph nodes: Mild asymmetric prominence of subcentimeter left-sided cervical lymph nodes, likely reactive. No pathologically enlarged lymph nodes identified. Vascular: Moderate atherosclerotic change seen about the aortic arch and carotid bifurcations. Calcified atherosclerosis present at the skull base as well. Limited intracranial: Age-related cerebral and cerebellar atrophy. Otherwise unremarkable. Visualized orbits:  Visualized globes and orbital soft tissues within normal limits. Mastoids and visualized paranasal sinuses: Visualized paranasal sinuses are clear. Mastoid air cells and middle ear cavities are well pneumatized and free of fluid. Skeleton: No definite osseous abnormality seen on this motion degraded exam. No discrete lytic or blastic osseous lesions. Upper chest: Large layering bilateral pleural effusions partially visualized. Right-sided pacemaker/AICD partially visualized as well. Other: None. IMPRESSION: 1. Findings consistent with acute left parotitis. Associated swelling with inflammatory stranding throughout the adjacent left neck compatible with associated regional cellulitis. No obstructive E[place] or drainable fluid collection identified. 2. Mucosal edema and swelling involving the left pharynx related to the inflammatory process within the adjacent left neck. 3. Large layering bilateral pleural effusions, partially visualized. 4. 7 mm hyperdensity and/or sclerosis at the level of the left arytenoid cartilage, indeterminate, and could be related to prior intervention. Correlation with history recommended. If no previous intervention has been performed, nonemergent outpatient ENT referral for direct visualization suggested for further evaluation. No discrete mass identified on this noncontrast motion degraded exam. 5.  Aortic Atherosclerosis (ICD10-I70.0). Electronically Signed   By: Jeannine Boga M.D.   On: 11/29/2019 00:50   DG CHEST PORT 1 VIEW  Result Date: 11/30/2019 CLINICAL DATA:  Increased shortness of breath EXAM: PORTABLE CHEST 1 VIEW COMPARISON:  11/26/2019 FINDINGS: Right-sided implanted cardiac device, unchanged. Stable mild cardiomegaly. Calcific aortic knob. Worsening bilateral pleural effusions with associated bibasilar airspace opacities. No pneumothorax. IMPRESSION: Worsening bilateral pleural effusions with associated bibasilar airspace opacities. Electronically Signed   By:  Davina Poke D.O.   On: 11/30/2019 10:41   DG Chest Portable 1 View  Result Date: 11/13/2019 CLINICAL DATA:  Shortness of breath EXAM: PORTABLE CHEST 1 VIEW COMPARISON:  September 27, 2019 FINDINGS: The heart size and mediastinal contours are unchanged with mild cardiomegaly. Aortic knob calcifications. A right-sided pacemaker is again identified. There are small bilateral pleural effusions. Hazy airspace opacity seen at the right lung base which could be layering effusion or atelectasis. There is streaky atelectasis seen in the right mid lung. No acute osseous abnormality. IMPRESSION: Small bilateral pleural effusions. Hazy airspace opacity at the right lung base which could be due to layering effusion or atelectasis. Electronically Signed   By: Prudencio Pair M.D.   On: 11/09/2019 22:25  ECHOCARDIOGRAM COMPLETE  Result Date: 11/29/2019    ECHOCARDIOGRAM REPORT   Patient Name:   ANEL CREIGHTON Date of Exam: 11/29/2019 Medical Rec #:  [id number]     Height:       69.0 in Accession #:    [id number]    Weight:       165.0 lb Date of Birth:  22-Jan-1933      BSA:          1.904 m Patient Age:    82 years      BP:           138/103 mmHg Patient Gender: F             HR:           87 bpm. Exam Location:  [facility] Procedure: 2D Echo Indications:    Congestive Heart Failure 428.0 / I50.9  History:        Patient has prior history of Echocardiogram examinations, most                 recent 10/31/2018. Pacemaker, COPD, Arrythmias:Atrial                 Fibrillation; Risk Factors:Hypertension, Dyslipidemia and                 Non-Smoker. Sick sinus syndrome.  Sonographer:    [name] RDCS (AE) Referring Phys: [id number] DAVID MANUEL [place]  1. Left ventricular ejection fraction, by estimation, is 70 to 75%. The left ventricle has hyperdynamic function. The left ventricle has no regional wall motion abnormalities. There is mild concentric left ventricular hypertrophy. Left ventricular diastolic parameters  are indeterminate.  2. Right ventricular systolic function is normal. The right ventricular size is mildly enlarged. There is moderately elevated pulmonary artery systolic pressure.  3. Left atrial size was severely dilated.  4. Right atrial size was moderately dilated.  5. The mitral valve is degenerative. Mild mitral valve regurgitation.  6. Tricuspid valve regurgitation is mild to moderate.  7. The aortic valve was not well visualized. Aortic valve regurgitation is not visualized.  8. The inferior vena cava is normal in size with greater than 50% respiratory variability, suggesting right atrial pressure of 3 mmHg. FINDINGS  Left Ventricle: Left ventricular ejection fraction, by estimation, is 70 to 75%. The left ventricle has hyperdynamic function. The left ventricle has no regional wall motion abnormalities. The left ventricular internal cavity size was normal in size. There is mild concentric left ventricular hypertrophy. Left ventricular diastolic parameters are indeterminate. Indeterminate filling pressures. Right Ventricle: The right ventricular size is mildly enlarged. No increase in right ventricular wall thickness. Right ventricular systolic function is normal. There is moderately elevated pulmonary artery systolic pressure. The tricuspid regurgitant velocity is 2.99 m/s, and with an assumed right atrial pressure of 10 mmHg, the estimated right ventricular systolic pressure is 02.7 mmHg. Left Atrium: Left atrial size was severely dilated. Right Atrium: Right atrial size was moderately dilated. Pericardium: There is no evidence of pericardial effusion. Mitral Valve: The mitral valve is degenerative in appearance. There is mild thickening of the mitral valve leaflet(s). Moderate mitral annular calcification. Mild mitral valve regurgitation. Tricuspid Valve: The tricuspid valve is grossly normal. Tricuspid valve regurgitation is mild to moderate. Aortic Valve: The aortic valve was not well visualized. Aortic  valve regurgitation is not visualized. Pulmonic Valve: The pulmonic valve was not well visualized. Pulmonic valve regurgitation is not visualized. Aorta: The aortic root is normal in size  and structure. Venous: The inferior vena cava is normal in size with greater than 50% respiratory variability, suggesting right atrial pressure of 3 mmHg. IAS/Shunts: No atrial level shunt detected by color flow Doppler. Additional Comments: A pacer wire is visualized.  LEFT VENTRICLE PLAX 2D LVIDd:         3.21 cm  Diastology LVIDs:         1.81 cm  LV e' lateral:   6.53 cm/s LV PW:         1.20 cm  LV E/e' lateral: 13.8 LV IVS:        1.16 cm  LV e' medial:    7.07 cm/s LVOT diam:     2.00 cm  LV E/e' medial:  12.7 LVOT Area:     3.14 cm  RIGHT VENTRICLE RV S prime:     14.00 cm/s LEFT ATRIUM           Index       RIGHT ATRIUM           Index LA diam:      4.20 cm 2.21 cm/m  RA Area:     20.40 cm LA Vol (A2C): 64.1 ml 33.67 ml/m RA Volume:   59.10 ml  31.04 ml/m LA Vol (A4C): 30.0 ml 15.76 ml/m   AORTA Ao Root diam: 2.40 cm MITRAL VALVE               TRICUSPID VALVE MV Area (PHT): 2.39 cm    TR Peak grad:   35.8 mmHg MV Decel Time: 317 msec    TR Vmax:        299.00 cm/s MV E velocity: 90.00 cm/s MV A velocity: 34.30 cm/s  SHUNTS MV E/A ratio:  2.62        Systemic Diam: 2.00 cm Kate Sable MD Electronically signed by Kate Sable MD Signature Date/Time: 11/29/2019/10:47:50 AM    Final      CBC Recent Labs  Lab 11/27/2019 2225 11/29/19 0830 11/30/19 0600 12/01/19 0348  WBC 15.9* 24.4* 22.9* 20.0*  HGB 13.6 12.4 12.5 11.9*  HCT 43.3 39.2 39.8 39.1  PLT 237 226 227 196  MCV 100.0 100.0 100.0 102.9*  MCH 31.4 31.6 31.4 31.3  MCHC 31.4 31.6 31.4 30.4  RDW 14.4 14.4 14.9 15.3  LYMPHSABS  --  1.0 1.3 1.5  MONOABS  --  0.9 0.8 0.9  EOSABS  --  0.0 0.0 0.0  BASOSABS  --  0.0 0.0 0.0    Chemistries  Recent Labs  Lab 11/29/2019 2225 11/29/19 0830 11/30/19 0600 12/01/19 0616  NA 136 136 137 137   K 3.9 4.2 4.2 3.8  CL 99 98 100 102  CO2 24 25 23 22   GLUCOSE 82 94 110* 92  BUN 53* 56* 59* 63*  CREATININE 2.68* 2.83* 3.17* 3.50*  CALCIUM 7.8* 7.7* 7.8* 7.6*  AST 35  --  30 28  ALT 23  --  22 20  ALKPHOS 133*  --  102 96  BILITOT 0.7  --  0.6 0.7   ------------------------------------------------------------------------------------------------------------------ No results for input(s): CHOL, HDL, LDLCALC, TRIG, CHOLHDL, LDLDIRECT in the last 72 hours.  Lab Results  Component Value Date   HGBA1C 5.0 11/29/2019   ------------------------------------------------------------------------------------------------------------------ No results for input(s): TSH, T4TOTAL, T3FREE, THYROIDAB in the last 72 hours.  Invalid input(s): FREET3 ------------------------------------------------------------------------------------------------------------------ No results for input(s): VITAMINB12, FOLATE, FERRITIN, TIBC, IRON, RETICCTPCT in the last 72 hours.  Coagulation profile No results for input(s): INR, PROTIME  in the last 168 hours.  No results for input(s): DDIMER in the last 72 hours.  Cardiac Enzymes No results for input(s): CKMB, TROPONINI, MYOGLOBIN in the last 168 hours.  Invalid input(s): CK ------------------------------------------------------------------------------------------------------------------    Component Value Date/Time   BNP 908.0 (H) 11/27/2019 2225   BNP 218.0 (H) 02/27/2014 1008   Roxan Hockey M.D on 12/02/2019 at 9:53 AM  Go to [email or web address] - for contact info  [facility] - Office  [phone]

## 2019-12-03 NOTE — Progress Notes (Signed)
Discussed with patients daughter about wound care orders scheduled to be completed . She states at this time she does not want her mother disturbed for such care. She voiced her wish is for mom to be left to rest.

## 2019-12-03 NOTE — Progress Notes (Signed)
Patient Demographics:    Kelsey Sandoval, is a 86 y.o. female, DOB - 07/19/1933, EZM:[id number]  Admit date - 11/27/2019   Admitting Physician Reubin Milan, MD  Outpatient Primary MD for the patient is Kathyrn Drown, MD  LOS - 4  Chief Complaint  Patient presents with   Leg Swelling        Subjective:    Kelsey Sandoval today has no fevers, no emesis,  No chest pain,  -  Patient's daughter and son at bedside -Apparently she was restless overnight, IV fentanyl as needed apparently was not adequate, so she was transitioned to IV morphine drip- -appears comfortable at this time  Assessment  & Plan :    Principal Problem:   Cellulitis of multiple sites Active Problems:   Paroxysmal atrial fibrillation (HCC)   Systemic hypertension   Dyslipidemia   Hypothyroidism   Major depression   Hereditary and idiopathic peripheral neuropathy   COPD (chronic obstructive pulmonary disease) ([place])   Pre-diabetes   Acute renal failure superimposed on stage 3b chronic kidney disease (HCC)   Cardiac volume overload   Acute parotitis  Brief Summary 70Y female COPD Gold stage III, chronic A. fib chads score >4 on Pradaxa with permanent pacemaker placement 05/2011 for tachybradycardia syndrome, pain (sciatica) with opiate habituation, bipolar, HTN, hypothyroid, mitral insufficiency Admitted to [facility] on 11/29/19 with bilateral lower extremity and neck swelling after recent adjustment of diuretic/torsemide regimen by cardiology team now found to have AKI, BNP 900 in the setting of pneumonia and lower extremity cellulitis with sepsis and persistent hypotension hypoxia and hypoglycemia -Patient and family requested transition to comfort care as of 12/01/2019 -Currently appears comfortable on IV morphine drip   A/p 1)AKI----acute kidney injury on CKD stage -worsening renal function due to sepsis with  persistent hypotension, compounded by poor oral intake in the setting of left-sided parotitis, compounded by ACEI/loop diuretic use  --creatinine on admission=  , baseline creatinine =    , creatinine is now=  , renally adjust medications, avoid nephrotoxic agents / dehydration  / hypotension -History of biopsy-proven membranous nephropathy, stage III/IV, followed by Dr. Lowanda Foster, biopsy in 2015, albumin 1.7 -Patient was anuric yesterday, now very oliguric -Patient declines renal ultrasound, patient declines further IV fluids -Patient repeatedly reiterated that she wants to remain comfort care only --Currently appears comfortable on IV Morphine Drip  2)Left-sided Parotitis--- fentanyl as needed, patient requested discontinuation of antibiotics, parotitis is making it difficult for patient to eat and drink  3)HFpEF--EF per echo from 11/29/2019 and 70 to 75%, patient with chronic diastolic dysfunction CHF, did not improve with torsemide PTA,\ --Patient repeatedly reiterated that she wants to  remain comfort care only --Currently appears comfortable on IV morphine drip  4) chronic atrial fibrillation--- patient requesting discontinuation of Pradaxa  5) sepsis secondary to bilateral pneumonia and lower extremity cellulitis----WBC is 20,000, patient was treated with vancomycin, Rocephin, Flagyl and Doxycycline-persistent hypotension -patient and family requested discontinuation of antibiotics and transition to comfort care  6)Social/Ethics-- Discussed with patient,  Conference at bedside with daughter [name]), son [name])  , daughter in Sports coach , son in Sports coach and granddaughter [name]  -Multiple/repeated conversations about plan of care, CODE STATUS, advanced directives and goals of care--other family  members were on video chat as well -After multiple conversations/conferences at bedside -The Following decisions were made --- by patient and her family 1) pt is a DNR/DNI, declines Pressors for hypotension and  possible septic shock, -As of 12/01/2019 patient refusing blood draws and routine medications, patient requesting discontinuation of antibiotic in order non-comfort related medications, -Patient and family have requested full comfort measures at this time -No ACLS Rx , No Pressors, No Bipap, No Rapid Response ICU RN Lilia Pro Dishmon was present for the conversation --Patient repeatedly reiterated that she wants to  remain comfort care only --Currently appears comfortable on IV morphine drip  Disposition/Need for in-Hospital Stay- patient unable to be discharged at this time due to --- hemodynamically unstable, persistent hypotension, persistent hypoglycemia, poor oral intake, patient transitioning to full comfort care -Possible discharge with hospice hospice over the next day or 2 --Currently appears comfortable on IV morphine drip -In-hospital death more likely --Patient repeatedly reiterated that she wants to  remain comfort care only  Code Status : DNR/comfort care  Family Communication: (patient is alert, awake and coherent) -Family members at bedside as outlined above  Consults  : Nephrology  DVT Prophylaxis  : Comfort care  Lab Results  Component Value Date   PLT 196 12/01/2019    Inpatient Medications  Scheduled Meds:  Chlorhexidine Gluconate Cloth  6 each Topical Daily   gabapentin  200 mg Oral BID   mouth rinse  15 mL Mouth Rinse BID   OLANZapine zydis  5 mg Oral Daily   sodium chloride flush  3 mL Intravenous Q12H   Continuous Infusions:  sodium chloride     chlorproMAZINE (THORAZINE) IV     dextrose 50 mL/hr at 12/03/19 0612   morphine 5 mg/hr (12/03/19 0400)   PRN Meds:.sodium chloride, acetaminophen **OR** acetaminophen, albuterol, alum & mag hydroxide-simeth, antiseptic oral rinse, bisacodyl, chlorproMAZINE (THORAZINE) IV, dextrose, diphenhydrAMINE, fentaNYL (SUBLIMAZE) injection, lidocaine, ondansetron **OR** ondansetron (ZOFRAN) IV, phenol, polyvinyl  alcohol, sodium chloride, sodium chloride flush   Anti-infectives (From admission, onward)   Start     Dose/Rate Route Frequency Ordered Stop   12/02/19 1000  doxycycline (VIBRA-TABS) tablet 100 mg  Status:  Discontinued     100 mg Oral Every 12 hours 12/01/19 0814 12/01/19 1744   12/01/19 1400  vancomycin (VANCOCIN) IVPB 1000 mg/200 mL premix  Status:  Discontinued     1,000 mg 200 mL/hr over 60 Minutes Intravenous Every 48 hours 11/29/19 1344 12/01/19 1744   12/01/19 0400  cefTRIAXone (ROCEPHIN) 2 g in sodium chloride 0.9 % 100 mL IVPB  Status:  Discontinued     2 g 200 mL/hr over 30 Minutes Intravenous Every 24 hours 11/30/19 1023 12/01/19 1744   11/30/19 0400  cefTRIAXone (ROCEPHIN) 1 g in sodium chloride 0.9 % 100 mL IVPB  Status:  Discontinued     1 g 200 mL/hr over 30 Minutes Intravenous Every 24 hours 11/29/19 1357 11/30/19 1023   11/29/19 1600  ceFEPIme (MAXIPIME) 2 g in sodium chloride 0.9 % 100 mL IVPB  Status:  Discontinued     2 g 200 mL/hr over 30 Minutes Intravenous Every 24 hours 11/29/19 1344 11/29/19 1357   11/29/19 1500  metroNIDAZOLE (FLAGYL) IVPB 500 mg  Status:  Discontinued     500 mg 100 mL/hr over 60 Minutes Intravenous Every 8 hours 11/29/19 1357 12/01/19 0814   11/29/19 1400  vancomycin (VANCOREADY) IVPB 1500 mg/300 mL     1,500 mg 150 mL/hr over 120 Minutes Intravenous  Once 11/29/19 1344 11/29/19 1841   11/29/19 1200  metroNIDAZOLE (FLAGYL) IVPB 500 mg  Status:  Discontinued     500 mg 100 mL/hr over 60 Minutes Intravenous Every 8 hours 11/29/19 0403 11/29/19 1315   11/29/19 0115  cefTRIAXone (ROCEPHIN) 1 g in sodium chloride 0.9 % 100 mL IVPB     1 g 200 mL/hr over 30 Minutes Intravenous  Once 11/29/19 0114 11/29/19 0522   11/29/19 0115  metroNIDAZOLE (FLAGYL) IVPB 500 mg     500 mg 100 mL/hr over 60 Minutes Intravenous  Once 11/29/19 0114 11/29/19 0445        Objective:   Vitals:   12/02/19 0200 12/02/19 0300 12/02/19 0400 12/02/19 0500  BP:       Pulse: (!) 102 (!) 114 (!) 105 (!) 110  Resp: 19 (!) 30 (!) 23 (!) 35  Temp:      TempSrc:      SpO2: 95% 99% 100% 100%  Weight:      Height:        Wt Readings from Last 3 Encounters:  12/01/19 74.3 kg  11/21/19 68.9 kg  09/16/19 71.7 kg    Intake/Output Summary (Last 24 hours) at 12/03/2019 1404 Last data filed at 12/03/2019 0537 Gross per 24 hour  Intake 56.78 ml  Output 300 ml  Net -243.22 ml   Physical Exam  Gen:- -Currently appears comfortable on IV morphine drip HEENT:- [place].AT, No sclera icterus Nose-4L/min Neck-Supple Neck,No JVD,.  Lungs-diminished in bases, no wheezing CV- S1, S2 normal, irregularly irregular Abd-  +ve B.Sounds, Abd Soft, No tenderness,    Extremity/Skin:-+ve   edema, pedal pulses present  NeuroPsych--Currently appears comfortable on IV morphine drip   Data Review:   Micro Results Recent Results (from the past 240 hour(s))  Group A Strep by PCR     Status: None   Collection Time: 11/29/19 12:13 AM   Specimen: Throat; Sterile Swab  Result Value Ref Range Status   Group A Strep by PCR NOT DETECTED NOT DETECTED Final    Comment: Performed at [facility], [address] [place], [place] [address]  Blood culture (routine x 2)     Status: Abnormal (Preliminary result)   Collection Time: 11/29/19  1:42 AM   Specimen: BLOOD  Result Value Ref Range Status   Specimen Description   Final    BLOOD BLOOD LEFT WRIST Performed at [facility], [address], [place], [place] [address]    Special Requests   Final    BOTTLES DRAWN AEROBIC AND ANAEROBIC Blood Culture adequate volume Performed at [facility], [address] [place], [place] [address]    Culture  Setup Time   Final    GRAM POSITIVE COCCI Gram Stain Report Called to,Read Back By and Verified With: HYLTON,L. AT 5885 ON 12/02/2019 BY EVA ANAEROBIC BOTTLE ONLY Performed at [facility] Performed at [facility], [address], [place], [place] [address]    Culture (A)  Final     STAPHYLOCOCCUS AUREUS SUSCEPTIBILITIES TO FOLLOW Performed at [facility], [place] [address] [place], [place] [address]    Report Status PENDING  Incomplete  Blood culture (routine x 2)     Status: None (Preliminary result)   Collection Time: 11/29/19  1:53 AM   Specimen: BLOOD LEFT HAND  Result Value Ref Range Status   Specimen Description BLOOD LEFT HAND  Final   Special Requests   Final    BOTTLES DRAWN AEROBIC AND ANAEROBIC  Blood Culture adequate volume   Culture   Final    NO GROWTH 3 DAYS Performed at [facility], [address], [place], [place] [address]    Report Status PENDING  Incomplete  MRSA PCR Screening     Status: Abnormal   Collection Time: 11/29/19  4:30 AM   Specimen: Nasopharyngeal  Result Value Ref Range Status   MRSA by PCR POSITIVE (A) NEGATIVE Final    Comment:        The GeneXpert MRSA Assay (FDA approved for NASAL specimens only), is one component of a comprehensive MRSA colonization surveillance program. It is not intended to diagnose MRSA infection nor to guide or monitor treatment for MRSA infections. RESULT CALLED TO, READ BACK BY AND VERIFIED WITH: MCLAUGHLIN A. AT [id number] ON [id number] BY THOMPSON S. Performed at [facility], [address], [place], [place] [address]   Respiratory Panel by RT PCR (Flu A&B, Covid) - Nasopharyngeal Swab     Status: None   Collection Time: 11/30/19 10:10 AM   Specimen: Nasopharyngeal Swab  Result Value Ref Range Status   SARS Coronavirus 2 by RT PCR NEGATIVE NEGATIVE Final    Comment: (NOTE) SARS-CoV-2 target nucleic acids are NOT DETECTED. The SARS-CoV-2 RNA is generally detectable in upper respiratoy specimens during the acute phase of infection. The lowest concentration of SARS-CoV-2 viral copies this assay can detect is 131 copies/mL. A negative result does not preclude SARS-Cov-2 infection and should not be used as the sole basis for treatment or other patient management decisions. A negative result  may occur with  improper specimen collection/handling, submission of specimen other than nasopharyngeal swab, presence of viral mutation(s) within the areas targeted by this assay, and inadequate number of viral copies (<131 copies/mL). A negative result must be combined with clinical observations, patient history, and epidemiological information. The expected result is Negative. Fact Sheet for Patients:  PinkCheek.be Fact Sheet for Healthcare Providers:  GravelBags.it This test is not yet ap proved or cleared by the [place] FDA and  has been authorized for detection and/or diagnosis of SARS-CoV-2 by FDA under an Emergency Use Authorization (EUA). This EUA will remain  in effect (meaning this test can be used) for the duration of the COVID-19 declaration under Section 564(b)(1) of the Act, 21 U.S.C. section 360bbb-3(b)(1), unless the authorization is terminated or revoked sooner.    Influenza A by PCR NEGATIVE NEGATIVE Final   Influenza B by PCR NEGATIVE NEGATIVE Final    Comment: (NOTE) The Xpert Xpress SARS-CoV-2/FLU/RSV assay is intended as an aid in  the diagnosis of influenza from Nasopharyngeal swab specimens and  should not be used as a sole basis for treatment. Nasal washings and  aspirates are unacceptable for Xpert Xpress SARS-CoV-2/FLU/RSV  testing. Fact Sheet for Patients: PinkCheek.be Fact Sheet for Healthcare Providers: GravelBags.it This test is not yet approved or cleared by the [place] FDA and  has been authorized for detection and/or diagnosis of SARS-CoV-2 by  FDA under an Emergency Use Authorization (EUA). This EUA will remain  in effect (meaning this test can be used) for the duration of the  Covid-19 declaration under Section 564(b)(1) of the Act, 21  U.S.C. section 360bbb-3(b)(1), unless the authorization is  terminated or  revoked. Performed at [facility], [address], [place], [place] [address]     Radiology Reports CT Soft Tissue Neck Wo Contrast  Result Date: 11/29/2019 CLINICAL DATA:  Initial evaluation for non pulsatile neck mass. EXAM: CT NECK WITHOUT CONTRAST  TECHNIQUE: Multidetector CT imaging of the neck was performed following the standard protocol without intravenous contrast. COMPARISON:  None. FINDINGS: Pharynx and larynx: Examination technically limited by motion artifact and lack of IV contrast. Oral cavity within normal limits. Patient is largely edentulous. Asymmetric prominence of the left palatine tonsil as compared to the right, likely reactive. Associated inflammatory stranding and swelling within the adjacent left parapharyngeal space related to the inflammatory process within the left face/neck. Asymmetric fullness and edema extends inferiorly along the left pharynx as well. Nasopharynx within normal limits. Mild retropharyngeal edema/effusion, greater on the left. No definite discrete retropharyngeal collection. Epiglottis itself grossly within normal limits. 7 mm hyperdensity seen at the level of the left arytenoid cartilage (series 2, image 70), indeterminate, and could be related to prior intervention. No discernible mass identified. True cords symmetric and within normal limits. Subglottic airway clear. Salivary glands: Asymmetric enlargement with inflammatory stranding seen involving the left parotid gland, suggesting acute parotitis. No obstructive Ferraz seen within Stensen's duct. Associated inflammatory stranding seen throughout the adjacent left parotid space, extending inferiorly along the left neck, and medially into the left parapharyngeal space. Inflammatory changes surround the left submandibular gland as well related to the inflammatory process within the left neck. No appreciable abscess or drainable fluid collection seen on this noncontrast examination. Right-sided salivary glands  within normal limits. Thyroid: Thyroid grossly within normal limits. Lymph nodes: Mild asymmetric prominence of subcentimeter left-sided cervical lymph nodes, likely reactive. No pathologically enlarged lymph nodes identified. Vascular: Moderate atherosclerotic change seen about the aortic arch and carotid bifurcations. Calcified atherosclerosis present at the skull base as well. Limited intracranial: Age-related cerebral and cerebellar atrophy. Otherwise unremarkable. Visualized orbits: Visualized globes and orbital soft tissues within normal limits. Mastoids and visualized paranasal sinuses: Visualized paranasal sinuses are clear. Mastoid air cells and middle ear cavities are well pneumatized and free of fluid. Skeleton: No definite osseous abnormality seen on this motion degraded exam. No discrete lytic or blastic osseous lesions. Upper chest: Large layering bilateral pleural effusions partially visualized. Right-sided pacemaker/AICD partially visualized as well. Other: None. IMPRESSION: 1. Findings consistent with acute left parotitis. Associated swelling with inflammatory stranding throughout the adjacent left neck compatible with associated regional cellulitis. No obstructive Duchesne or drainable fluid collection identified. 2. Mucosal edema and swelling involving the left pharynx related to the inflammatory process within the adjacent left neck. 3. Large layering bilateral pleural effusions, partially visualized. 4. 7 mm hyperdensity and/or sclerosis at the level of the left arytenoid cartilage, indeterminate, and could be related to prior intervention. Correlation with history recommended. If no previous intervention has been performed, nonemergent outpatient ENT referral for direct visualization suggested for further evaluation. No discrete mass identified on this noncontrast motion degraded exam. 5.  Aortic Atherosclerosis (ICD10-I70.0). Electronically Signed   By: Jeannine Boga M.D.   On: 11/29/2019  00:50   DG CHEST PORT 1 VIEW  Result Date: 11/30/2019 CLINICAL DATA:  Increased shortness of breath EXAM: PORTABLE CHEST 1 VIEW COMPARISON:  11/20/2019 FINDINGS: Right-sided implanted cardiac device, unchanged. Stable mild cardiomegaly. Calcific aortic knob. Worsening bilateral pleural effusions with associated bibasilar airspace opacities. No pneumothorax. IMPRESSION: Worsening bilateral pleural effusions with associated bibasilar airspace opacities. Electronically Signed   By: Davina Poke D.O.   On: 11/30/2019 10:41   DG Chest Portable 1 View  Result Date: 11/18/2019 CLINICAL DATA:  Shortness of breath EXAM: PORTABLE CHEST 1 VIEW COMPARISON:  September 27, 2019 FINDINGS: The heart size and mediastinal contours are unchanged with mild  cardiomegaly. Aortic knob calcifications. A right-sided pacemaker is again identified. There are small bilateral pleural effusions. Hazy airspace opacity seen at the right lung base which could be layering effusion or atelectasis. There is streaky atelectasis seen in the right mid lung. No acute osseous abnormality. IMPRESSION: Small bilateral pleural effusions. Hazy airspace opacity at the right lung base which could be due to layering effusion or atelectasis. Electronically Signed   By: Prudencio Pair M.D.   On: 12/01/2019 22:25   ECHOCARDIOGRAM COMPLETE  Result Date: 11/29/2019    ECHOCARDIOGRAM REPORT   Patient Name:   DESIREY KEAHEY Date of Exam: 11/29/2019 Medical Rec #:  [id number]     Height:       69.0 in Accession #:    [id number]    Weight:       165.0 lb Date of Birth:  [birth year]      BSA:          1.904 m Patient Age:    30 years      BP:           138/103 mmHg Patient Gender: F             HR:           87 bpm. Exam Location:  [facility] Procedure: 2D Echo Indications:    Congestive Heart Failure 428.0 / I50.9  History:        Patient has prior history of Echocardiogram examinations, most                 recent 10/31/2018. Pacemaker, COPD, Arrythmias:Atrial                  Fibrillation; Risk Factors:Hypertension, Dyslipidemia and                 Non-Smoker. Sick sinus syndrome.  Sonographer:    [name] RDCS (AE) Referring Phys: [id number] DAVID MANUEL Saxton  1. Left ventricular ejection fraction, by estimation, is 70 to 75%. The left ventricle has hyperdynamic function. The left ventricle has no regional wall motion abnormalities. There is mild concentric left ventricular hypertrophy. Left ventricular diastolic parameters are indeterminate.  2. Right ventricular systolic function is normal. The right ventricular size is mildly enlarged. There is moderately elevated pulmonary artery systolic pressure.  3. Left atrial size was severely dilated.  4. Right atrial size was moderately dilated.  5. The mitral valve is degenerative. Mild mitral valve regurgitation.  6. Tricuspid valve regurgitation is mild to moderate.  7. The aortic valve was not well visualized. Aortic valve regurgitation is not visualized.  8. The inferior vena cava is normal in size with greater than 50% respiratory variability, suggesting right atrial pressure of 3 mmHg. FINDINGS  Left Ventricle: Left ventricular ejection fraction, by estimation, is 70 to 75%. The left ventricle has hyperdynamic function. The left ventricle has no regional wall motion abnormalities. The left ventricular internal cavity size was normal in size. There is mild concentric left ventricular hypertrophy. Left ventricular diastolic parameters are indeterminate. Indeterminate filling pressures. Right Ventricle: The right ventricular size is mildly enlarged. No increase in right ventricular wall thickness. Right ventricular systolic function is normal. There is moderately elevated pulmonary artery systolic pressure. The tricuspid regurgitant velocity is 2.99 m/s, and with an assumed right atrial pressure of 10 mmHg, the estimated right ventricular systolic pressure is 76.2 mmHg. Left Atrium: Left atrial size was  severely dilated. Right Atrium: Right atrial size was moderately dilated. Pericardium: There is  no evidence of pericardial effusion. Mitral Valve: The mitral valve is degenerative in appearance. There is mild thickening of the mitral valve leaflet(s). Moderate mitral annular calcification. Mild mitral valve regurgitation. Tricuspid Valve: The tricuspid valve is grossly normal. Tricuspid valve regurgitation is mild to moderate. Aortic Valve: The aortic valve was not well visualized. Aortic valve regurgitation is not visualized. Pulmonic Valve: The pulmonic valve was not well visualized. Pulmonic valve regurgitation is not visualized. Aorta: The aortic root is normal in size and structure. Venous: The inferior vena cava is normal in size with greater than 50% respiratory variability, suggesting right atrial pressure of 3 mmHg. IAS/Shunts: No atrial level shunt detected by color flow Doppler. Additional Comments: A pacer wire is visualized.  LEFT VENTRICLE PLAX 2D LVIDd:         3.21 cm  Diastology LVIDs:         1.81 cm  LV e' lateral:   6.53 cm/s LV PW:         1.20 cm  LV E/e' lateral: 13.8 LV IVS:        1.16 cm  LV e' medial:    7.07 cm/s LVOT diam:     2.00 cm  LV E/e' medial:  12.7 LVOT Area:     3.14 cm  RIGHT VENTRICLE RV S prime:     14.00 cm/s LEFT ATRIUM           Index       RIGHT ATRIUM           Index LA diam:      4.20 cm 2.21 cm/m  RA Area:     20.40 cm LA Vol (A2C): 64.1 ml 33.67 ml/m RA Volume:   59.10 ml  31.04 ml/m LA Vol (A4C): 30.0 ml 15.76 ml/m   AORTA Ao Root diam: 2.40 cm MITRAL VALVE               TRICUSPID VALVE MV Area (PHT): 2.39 cm    TR Peak grad:   35.8 mmHg MV Decel Time: 317 msec    TR Vmax:        299.00 cm/s MV E velocity: 90.00 cm/s MV A velocity: 34.30 cm/s  SHUNTS MV E/A ratio:  2.62        Systemic Diam: 2.00 cm Kate Sable MD Electronically signed by Kate Sable MD Signature Date/Time: 11/29/2019/10:47:50 AM    Final      CBC Recent Labs  Lab  12/03/2019 2225 11/29/19 0830 11/30/19 0600 12/01/19 0348  WBC 15.9* 24.4* 22.9* 20.0*  HGB 13.6 12.4 12.5 11.9*  HCT 43.3 39.2 39.8 39.1  PLT 237 226 227 196  MCV 100.0 100.0 100.0 102.9*  MCH 31.4 31.6 31.4 31.3  MCHC 31.4 31.6 31.4 30.4  RDW 14.4 14.4 14.9 15.3  LYMPHSABS  --  1.0 1.3 1.5  MONOABS  --  0.9 0.8 0.9  EOSABS  --  0.0 0.0 0.0  BASOSABS  --  0.0 0.0 0.0    Chemistries  Recent Labs  Lab 11/26/2019 2225 11/29/19 0830 11/30/19 0600 12/01/19 0616  NA 136 136 137 137  K 3.9 4.2 4.2 3.8  CL 99 98 100 102  CO2 24 25 23 22   GLUCOSE 82 94 110* 92  BUN 53* 56* 59* 63*  CREATININE 2.68* 2.83* 3.17* 3.50*  CALCIUM 7.8* 7.7* 7.8* 7.6*  AST 35  --  30 28  ALT 23  --  22 20  ALKPHOS 133*  --  102  96  BILITOT 0.7  --  0.6 0.7   ------------------------------------------------------------------------------------------------------------------ No results for input(s): CHOL, HDL, LDLCALC, TRIG, CHOLHDL, LDLDIRECT in the last 72 hours.  Lab Results  Component Value Date   HGBA1C 5.0 11/29/2019   ------------------------------------------------------------------------------------------------------------------ No results for input(s): TSH, T4TOTAL, T3FREE, THYROIDAB in the last 72 hours.  Invalid input(s): FREET3 ------------------------------------------------------------------------------------------------------------------ No results for input(s): VITAMINB12, FOLATE, FERRITIN, TIBC, IRON, RETICCTPCT in the last 72 hours.  Coagulation profile No results for input(s): INR, PROTIME in the last 168 hours.  No results for input(s): DDIMER in the last 72 hours.  Cardiac Enzymes No results for input(s): CKMB, TROPONINI, MYOGLOBIN in the last 168 hours.  Invalid input(s): CK ------------------------------------------------------------------------------------------------------------------    Component Value Date/Time   BNP 908.0 (H) 11/16/2019 2225   BNP 218.0 (H)  02/27/2014 1008   Shayleen Eppinger M.D on 12/03/2019 at 2:04 PM  Go to [email or web address] - for contact info  [facility] - Office  [phone]

## 2019-12-03 NOTE — Progress Notes (Signed)
Called to patients room on multiple occasions by her daughter to discuss possible s/s for impending death. Daughter continues to need re-assurance  on stages of death. Rn did instruct caregiver that patterns/ or signs of  for death can vary from patient to patient, and that it is quite difficult to be predicted by staff or provider. Patient appears to have altered level of consciousness, confused conversations, , occasional hallucinations,  morphine drip initiated for c/o pain as noted by patient's daughter. Patients daughter had requested q1 pain medication x3 prior to drip be initiated.  Heart rate ranging bet 100-112. Daughter reiterates comfort care only, wanting no labs or wound care performed. Despite visit from [place] patients daughter continues to have difficulty coming to terms patients condition and dying process. By completion of this note the patient now appears to be resting calmly. I will continue to monitor patient closely as the shift progresses .

## 2019-12-04 DIAGNOSIS — R6520 Severe sepsis without septic shock: Secondary | ICD-10-CM

## 2019-12-04 DIAGNOSIS — A419 Sepsis, unspecified organism: Secondary | ICD-10-CM | POA: Diagnosis present

## 2019-12-04 DIAGNOSIS — N179 Acute kidney failure, unspecified: Secondary | ICD-10-CM

## 2019-12-04 DIAGNOSIS — N1832 Chronic kidney disease, stage 3b: Secondary | ICD-10-CM

## 2019-12-04 DIAGNOSIS — Z515 Encounter for palliative care: Secondary | ICD-10-CM

## 2019-12-04 DIAGNOSIS — I5032 Chronic diastolic (congestive) heart failure: Secondary | ICD-10-CM | POA: Diagnosis present

## 2019-12-04 DIAGNOSIS — Z7189 Other specified counseling: Secondary | ICD-10-CM

## 2019-12-04 DIAGNOSIS — L03119 Cellulitis of unspecified part of limb: Secondary | ICD-10-CM

## 2019-12-04 DIAGNOSIS — I4891 Unspecified atrial fibrillation: Secondary | ICD-10-CM

## 2019-12-04 DIAGNOSIS — K1121 Acute sialoadenitis: Secondary | ICD-10-CM

## 2019-12-04 DIAGNOSIS — I495 Sick sinus syndrome: Secondary | ICD-10-CM

## 2019-12-04 DIAGNOSIS — J189 Pneumonia, unspecified organism: Secondary | ICD-10-CM | POA: Diagnosis present

## 2019-12-04 DIAGNOSIS — L02419 Cutaneous abscess of limb, unspecified: Secondary | ICD-10-CM | POA: Diagnosis present

## 2019-12-04 LAB — CULTURE, BLOOD (ROUTINE X 2)
Culture: NO GROWTH
Special Requests: ADEQUATE
Special Requests: ADEQUATE

## 2019-12-04 DEATH — deceased

## 2019-12-12 ENCOUNTER — Ambulatory Visit: Payer: PPO | Admitting: Family Medicine

## 2019-12-12 ENCOUNTER — Telehealth: Payer: PPO | Admitting: Student

## 2020-01-04 NOTE — Progress Notes (Signed)
Pt family requested assistance with cleaning patient. Per request and with assistance of Tech's, pt was cleaned, mouthcare performed and all dressings were changed. Family at bedside voiced concerns that the patient was getting better. I acknowledged their feelings and offered reassurance of comfort for the patient during the final stages of life. Family was understanding and will remain at bedside.

## 2020-01-04 NOTE — Progress Notes (Signed)
Morphine infusion, 13ml wasted in sterijug by [employer], RN and [employer], RN

## 2020-01-04 NOTE — Discharge Summary (Addendum)
Kelsey Sandoval AOZ:[id number] DOB: 1932-10-25 DOA: 01-Dec-2019  PCP: Kathyrn Drown, MD PCP/Office notified:   Admit date: Dec 01, 2019 Date of Death: 12/07/19  Final Diagnoses:  Principal Problem:   Sepsis ([place]) Active Problems:   AKI superimposed on stage 3b chronic kidney disease ([place])   Lt Acute parotitis   Pneumonia   Cellulitis and abscess of lower extremity   End of life care---Comfort care Only   Sick sinus syndrome /TachyBrady Syndrome--Has Pacemaker   COPD (chronic obstructive pulmonary disease) ([place])   Cellulitis of multiple sites   Chronic diastolic CHF (congestive heart failure)/HFpEF   Palliative care encounter   DNR (do not resuscitate) discussion   Systemic hypertension   Dyslipidemia   Hypothyroidism   Major depression   Hereditary and idiopathic peripheral neuropathy   Chronic Atrial fibrillation with RVR (HCC)   Pre-diabetes   Cardiac volume overload    History of present illness:  PCP: Kathyrn Drown, MD   Patient coming from: Home.  I have personally briefly reviewed patient's old medical records in [place]  Chief Complaint: Leg swelling.  HPI: Kelsey Sandoval is a 86 y.o. female with medical history significant for anxiety, depression, aortic atherosclerosis, arthritis, chronic back pain, bilateral cataracts, COPD,  essential hypertension, GERD, hyperlipidemia, hypothyroidism, CKD stage 3b, nephrotic syndrome/membranous nephropathy, peripheral neuropathy, paroxysmal atrial fibrillation, sick sinus syndrome, history of pacemaker placement who is brought from her facility due to bilateral lower extremities and neck swelling.  ED Course: Initial vital signs temperature 97.5 F, pulse 90, respirations 24, blood pressure 159/106 mmHg O2 sat 100% on 2 LPM via [place]. The patient was given fentanyl 50 mcg IVP Zofran 4 mg IVP x1.  CBC showed a white count of 15.9, hemoglobin 13.6 g/dL and platelets 237.  Group A strep by PCR was nondetected.  SARS  coronavirus 2 antigen was negative.  PCR for coronavirus is still pending.  Cultures x2 were drawn.  CMP shows normal electrolytes when calcium is corrected to albumin.  BUN is 53, creatinine 2.68 mg/dL.  In December 2020 her creatinine level was 1.47, 2 measurements in November was 1.54 and in July was 1.34 mg/dL.  Total protein was 5.4 and albumin 2.0 g/L.  Transaminases and bilirubin are normal.  Alk phos slightly elevated 133 units/L.  BNP was 908 pg/mL.  Review of Systems: As per HPI otherwise 10 point review of systems negative.   Hospital Course:  1)AKI----acute kidney injury on CKD stage IIIb -worsening renal function due to sepsis with persistent hypotension, compounded by poor oral intake in the setting of left-sided parotitis, compounded by ACEI/loop diuretic use  --creatinine  Continued to trend up despite hydration and avoidance of nephrotoxins  , renally adjusted medications, avoid nephrotoxic agents / dehydration  / hypotension -History of biopsy-proven membranous nephropathy, stage III/IV, followed by Dr. Lowanda Foster, biopsy in 2015, albumin 1.7 -Nephrology consult appreciated, -patient became mostly anuric/severely oliguric -Patient declines renal ultrasound, patient declined further IV fluids -Patient repeatedly reiterated that she wanted to remain comfort care only --She was kept comfortable on IV Morphine Drip  2)Left-sided Parotitis--- fentanyl as needed, later switched to IV morphine drip patient requested discontinuation of antibiotics, parotitis is making it difficult for patient to eat and drink  3)HFpEF--EF per echo from 11/29/2019 and 70 to 75%, patient with chronic diastolic dysfunction CHF, did not improve with torsemide PTA, --Patient repeatedly reiterated that she wanted to  remain comfort care only --Currently appears comfortable on IV morphine drip  4) chronic  atrial fibrillation--- patient requested discontinuation of Pradaxa  5) sepsis secondary to bilateral  pneumonia and lower extremity cellulitis----WBC was 20,000, patient was treated with vancomycin, Rocephin, Flagyl and Doxycycline- Pt had persistent hypotension -patient and family requested discontinuation of antibiotics and transition to comfort care  6)Social/Ethics--Discussed withpatient,Conference at bedside with daughter(Linda), son(Carl) , daughter in law , son in law and granddaughter [name] -Multiple/repeated conversations about plan of care, CODE STATUS,advanced directives and goals of care--otherfamily members were on video chat as well -After multiple conversations/conferences at bedside -TheFollowing decisions were made---by patient and her family 1)pt is aDNR/DNI, declinedPressorsfor hypotension and possible septic shock, -As of 12/01/2019 patient refused blood draws and routine medications, patient requesting discontinuation of antibiotic in order non-comfort related medications, -Patient and family have requested full comfort measures at this time -No ACLS Rx , No Pressors, No Bipap, No Rapid Response ICU RN Daivd Council present for the conversation --Patient repeatedly reiterated that she wanted to  remain comfort care only She was kept comfortable on IV morphine drip  Disposition-patient passed away on Dec 24, 2019 at 7:25 AM with family at bedside  Code Status : DNR/comfort care  Family Communication: (patient is alert, awake and coherent) -Family members at bedside   Consults  : Nephrology   Time: patient passed away on Dec 24, 2019 at 7:25 AM with family at bedside  Signed:  [place]  [facility] 12-24-19, 8:19 AM

## 2020-01-04 DEATH — deceased
